# Patient Record
Sex: Male | Born: 1935
Health system: Southern US, Community
[De-identification: ages and names within clinical notes are randomized; demographics above are authoritative.]

## PROBLEM LIST (undated history)

## (undated) DIAGNOSIS — Z860101 Personal history of adenomatous and serrated colon polyps: Secondary | ICD-10-CM

## (undated) DIAGNOSIS — I350 Nonrheumatic aortic (valve) stenosis: Secondary | ICD-10-CM

## (undated) DIAGNOSIS — I2510 Atherosclerotic heart disease of native coronary artery without angina pectoris: Secondary | ICD-10-CM

## (undated) DIAGNOSIS — E785 Hyperlipidemia, unspecified: Secondary | ICD-10-CM

## (undated) DIAGNOSIS — I10 Essential (primary) hypertension: Secondary | ICD-10-CM

## (undated) DIAGNOSIS — Z973 Presence of spectacles and contact lenses: Secondary | ICD-10-CM

## (undated) DIAGNOSIS — R399 Unspecified symptoms and signs involving the genitourinary system: Secondary | ICD-10-CM

## (undated) DIAGNOSIS — N400 Enlarged prostate without lower urinary tract symptoms: Secondary | ICD-10-CM

## (undated) DIAGNOSIS — R152 Fecal urgency: Secondary | ICD-10-CM

## (undated) DIAGNOSIS — K5730 Diverticulosis of large intestine without perforation or abscess without bleeding: Secondary | ICD-10-CM

## (undated) DIAGNOSIS — E119 Type 2 diabetes mellitus without complications: Secondary | ICD-10-CM

## (undated) DIAGNOSIS — Z86010 Personal history of colonic polyps: Secondary | ICD-10-CM

## (undated) DIAGNOSIS — M1990 Unspecified osteoarthritis, unspecified site: Secondary | ICD-10-CM

## (undated) HISTORY — PX: CATARACT EXTRACTION W/ INTRAOCULAR LENS  IMPLANT, BILATERAL: SHX1307

## (undated) HISTORY — DX: Atherosclerotic heart disease of native coronary artery without angina pectoris: I25.10

## (undated) HISTORY — PX: COLONOSCOPY: SHX5424

## (undated) HISTORY — DX: Nonrheumatic aortic (valve) stenosis: I35.0

## (undated) HISTORY — DX: Essential (primary) hypertension: I10

## (undated) HISTORY — PX: CORONARY ANGIOPLASTY WITH STENT PLACEMENT: SHX49

---

## 1999-07-18 HISTORY — PX: INGUINAL HERNIA REPAIR: SUR1180

## 2002-03-09 ENCOUNTER — Emergency Department (HOSPITAL_COMMUNITY): Admission: EM | Admit: 2002-03-09 | Discharge: 2002-03-09 | Payer: Self-pay | Admitting: *Deleted

## 2002-03-09 ENCOUNTER — Encounter: Payer: Self-pay | Admitting: *Deleted

## 2006-05-22 ENCOUNTER — Ambulatory Visit (HOSPITAL_COMMUNITY): Admission: RE | Admit: 2006-05-22 | Discharge: 2006-05-22 | Payer: Self-pay | Admitting: Family Medicine

## 2007-12-04 ENCOUNTER — Ambulatory Visit (HOSPITAL_COMMUNITY): Admission: RE | Admit: 2007-12-04 | Discharge: 2007-12-04 | Payer: Self-pay | Admitting: Family Medicine

## 2007-12-06 ENCOUNTER — Ambulatory Visit: Payer: Self-pay | Admitting: Cardiovascular Disease

## 2007-12-06 ENCOUNTER — Ambulatory Visit (HOSPITAL_COMMUNITY): Admission: RE | Admit: 2007-12-06 | Discharge: 2007-12-06 | Payer: Self-pay | Admitting: Cardiovascular Disease

## 2007-12-06 ENCOUNTER — Encounter: Payer: Self-pay | Admitting: Family Medicine

## 2007-12-06 ENCOUNTER — Ambulatory Visit (HOSPITAL_COMMUNITY): Admission: RE | Admit: 2007-12-06 | Discharge: 2007-12-06 | Payer: Self-pay | Admitting: Family Medicine

## 2007-12-06 HISTORY — PX: TRANSTHORACIC ECHOCARDIOGRAM: SHX275

## 2007-12-19 ENCOUNTER — Inpatient Hospital Stay (HOSPITAL_BASED_OUTPATIENT_CLINIC_OR_DEPARTMENT_OTHER): Admission: RE | Admit: 2007-12-19 | Discharge: 2007-12-19 | Payer: Self-pay | Admitting: Cardiology

## 2007-12-19 ENCOUNTER — Ambulatory Visit: Payer: Self-pay | Admitting: Cardiology

## 2007-12-23 ENCOUNTER — Inpatient Hospital Stay (HOSPITAL_COMMUNITY): Admission: RE | Admit: 2007-12-23 | Discharge: 2007-12-24 | Payer: Self-pay | Admitting: Cardiology

## 2007-12-23 ENCOUNTER — Ambulatory Visit: Payer: Self-pay | Admitting: Cardiology

## 2008-01-21 ENCOUNTER — Encounter (HOSPITAL_COMMUNITY): Admission: RE | Admit: 2008-01-21 | Discharge: 2008-02-20 | Payer: Self-pay | Admitting: Cardiovascular Disease

## 2008-12-28 ENCOUNTER — Encounter: Payer: Self-pay | Admitting: Cardiovascular Disease

## 2009-08-20 ENCOUNTER — Ambulatory Visit (HOSPITAL_COMMUNITY): Admission: RE | Admit: 2009-08-20 | Discharge: 2009-08-20 | Payer: Self-pay | Admitting: Internal Medicine

## 2010-05-18 ENCOUNTER — Ambulatory Visit (HOSPITAL_COMMUNITY): Admission: RE | Admit: 2010-05-18 | Discharge: 2010-05-18 | Payer: Self-pay | Admitting: Internal Medicine

## 2010-11-29 NOTE — Procedures (Signed)
Jordan [name], Jordan [name]              ACCOUNT NO.:  [id number]   MEDICAL RECORD NO.:  [id number]          PATIENT TYPE:  OUT   LOCATION:  DFTL                          FACILITY:  [facility]   PHYSICIAN:  Scott A. Gerda Diss, MD    DATE OF BIRTH:  September 30, 1922   DATE OF PROCEDURE:  DATE OF DISCHARGE:                                  STRESS TEST   An 77 year old male with DOE and occasional left leg discomfort.   HISTORY OF PRESENT ILLNESS:  This gentleman presents with having  progressive notice of shortness of breath over the course of the past  few weeks.  He does have a history of diabetes, hyperlipidemia, and HTN.   PROTOCOL:  Standard stress test.   INDICATIONS:  Per above.   Resting EKG:  No acute ST-segment changes are noted, normal sinus  rhythm.   The patient was started on the standard Bruce protocol and immediately  heart rate went up from 71 into the low 100s.  The patient denied any  chest tightness, pressure, pain, or shortness breath.  He states that he  was not experiencing any discomfort, and it was very similar to the pace  he would take if he is walking in a stall.  When he reached stage 2, he  stated that was faster and more strenuous than what he would normally  take, and at about 2 minutes and 30 seconds, he states he was into stage  2.  He was feeling shortness of breath and relating some left leg  claudication symptoms.  Denied any chest pain.  The patient had  continued shortness of breath and at 2 minutes and 40 seconds into stage  2, we stopped the test.  It should be noted that he did have some ST  segment flattening and depression in lead III and aVF as well as lead  II, and he also had some ST segment changes in V5 and V6.  During the  recovery phase, he did have hypertensive episode, but no chest  discomfort with her shortness of breath.  He had progressive flattening  of the ST segment, II, III, aVF as well as V4, V5, and V6, but the heart  rate did gradually  slow down.  The patient did not experience any chest  pain or discomfort.  ST segments stayed depressed as far as being  flattened slightly and downturned in II, III, and aVF.  At 4 minutes  into recovery, and at 5 minutes, there was not the depression that I  expected; and at 6 minutes, he had recovery of those signs; and finally  at 9 minutes after recovery, when his EKG looked good and that baseline  blood pressure was good, he was discharged from the stress lab.   INTERPRETATION:  Abnormal stress test.   I did send him to the cardiology and have a more definitive testing  done.  We will set him up for echo as well, and probably, we will do an  arterial Doppler study __________.  We will proceed.      Scott A. Gerda Diss, MD  Electronically Signed     SAL/MEDQ  D:  12/04/2007  T:  12/04/2007  Job:  [id number]

## 2010-11-29 NOTE — Assessment & Plan Note (Signed)
[facility]                       [facility] CARDIOLOGY OFFICE NOTE   THEOPOLIS, SLOOP                     MRN:          [id number]  DATE:12/06/2007                            DOB:          08-Dec-1922    HISTORY OF PRESENT ILLNESS:  [name] is an 77 year old patient  referred by Dr. Gerda [name] for abnormal stress test and increasing  exertional dyspnea.  The patient has not had chest pain.  However, he  has had unexplained increasing dyspnea over the last 6 months.  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  He has  noticed over the last 6 months increasing dyspnea when he carries the  food or does a lot of walking.  He had an exercise stress test with Dr.  Gerda [name].  It was fairly abnormal.  The patient apparently walked about 6  minutes on a treadmill.  He had significant EKG changes in multiple  leads and exercise-induced hypotension and dyspnea.   I do not actually have the EKG tracings here, but did read a report.   The patient's risk factors include hypercholesterolemia and  hypertension.   He has not had previous cardiac workup before.  He had a 2-D  echocardiogram this morning in [place] before I saw him.  I read it.  He has mild LVH with normal LV function, trivial aortic insufficiency  and normal mitral valve, normal right-sided cardiac chambers, no  evidence of pulmonary hypertension.  It was a fairly benign looking echo  and does not explain his dyspnea.   REVIEW OF SYSTEMS:  Remarkable for no significant cough or sputum  production.  He has not had a history of chronic lung disease.  He is a  nonsmoker.  There has been no asthma.  No lower extremity edema or signs  of a pulmonary hypertension.   PAST MEDICAL HISTORY:  Fairly benign.  He has not been hospitalized.  He  has had hypertension, hypercholesterolemia, some prostatism and some  difficulty at times sleeping   ALLERGIES:  HE HAS NO KNOWN  ALLERGIES.   SOCIAL HISTORY:  He is widowed.  He has two children that live in the  area.  He is active with the church, in particularly the food pantry.  He does not drink or smoke.   FAMILY HISTORY:  Unremarkable in regards to premature coronary disease.  He does have brothers who have unspecified heart issues.   MEDICATIONS:  1. Enalapril 5 a day.  2. Pravastatin 40 a day.  3. Flomax 0.4 a day.  4. Amitriptyline 25 a day.   PHYSICAL EXAMINATION:  GENERAL:  Remarkable for an elderly white male in  no distress.  VITAL SIGNS:  Weight is 166, blood pressure 126/60, pulse 60 and  regular, respiratory 14, afebrile.  Affect appropriate.  HEENT:  Unremarkable.  Carotids normal without bruit.  No  lymphadenopathy, thyromegaly, JVP elevation.  LUNGS:  Clear with good diaphragmatic motion.  No wheezing, S1-S2,  normal heart sounds.  PMI normal.  ABDOMEN:  Benign, bowel sounds positive.  No AAA.  No tenderness.  No  bruit.  No hepatosplenomegaly.  No hepatojugular reflux.  Distal pulses  intact.  No edema.  NEUROLOGICAL:  Nonfocal.  SKIN:  Warm and dry.  No muscular weakness.   STUDIES:  EKG shows sinus rhythm with incomplete right bundle branch  block and left anterior fascicular block.   IMPRESSION:  1. Abnormal stress test with exertional dyspnea, question anginal      equivalent, talked to the patient at length.  I think the best test      for him would be a right and left heart catheterization.  I am      particularly impressed by Dr. Fletcher [name] wording of the abnormalities      in the stress test.  He has no other reason to have worsening      exertional dyspnea in the last 6 months.  Catheterization will      include right heart pressures.  2. Hypertension, currently well-controlled.  Continue enalapril 5 mg a      day, low-salt diet.  3. Hypercholesterolemia.  Continue pravastatin.  Lipid and liver      profile in 6 months.  4. Prostatism.  Continue Flomax 0.4 mg a day.  PSA  in 6 months.  5. Routine lab work will be done as well.  I think at the time we will      be able to screen for anemia as the cause of his exertional      dyspnea, and also make sure the remainder of his labs are in a good      range.  He will also have a chest x-ray prior to his cath which      needs to be looked at.  As far as I can tell, he has not had a      recent one and he has significant dyspnea.   I explained to the patient that I do not have any lab days in the  upcoming weeks, and I will have Dr. Antoine Poche do his right and left heart  cath on June 4.     Jordan [name]. Jordan Emms, MD, [facility]  Electronically Signed    PCN/MedQ  DD: 12/06/2007  DT: 12/06/2007  Job #: [id number]

## 2010-11-29 NOTE — Cardiovascular Report (Signed)
Jordan [name], Jordan [name]              ACCOUNT NO.:  [id number]   MEDICAL RECORD NO.:  [id number]          PATIENT TYPE:  OIB   LOCATION:  1965                         FACILITY:  [facility]   PHYSICIAN:  Rollene Rotunda, MD, FACCDATE OF BIRTH:  April 23, 1923   DATE OF PROCEDURE:  12/19/2007  DATE OF DISCHARGE:  12/19/2007                            CARDIAC CATHETERIZATION   PRIMARY CARE PHYSICIAN:  Scott A. Gerda Diss, MD   CARDIOLOGIST:  Noralyn Pick. Eden Emms, MD, [facility]   PROCEDURE:  Left and right heart catheterization/coronary arteriography.   INDICATIONS:  The patient with dyspnea on exertion and an abnormal  exercise treadmill test with significant EKG changes in multiple  leads.   PROCEDURE NOTE:  Left heart catheterization performed via the right  femoral artery, right heart catheterization performed via the right  femoral vein.  Both vessels were cannulated using the anterior wall  puncture.  A #4-French arterial sheath and a #7-French venous sheath  were inserted via the modified Seldinger technique with preformed  Judkins and a pigtail catheter were utilized as well as a Swan-Ganz  catheter.  The patient tolerated the procedure well and left the lab in  stable condition.   RESULTS:  Hemodynamics RA mean 7, RV 32/40, PA 28/11 with a mean of 18,  pulmonary capillary wedge pressure mean 9.  LV 127/17, AL 127/80.  Coronaries, the left main was calcified with diffuse 25% stenosis.  The  LAD had proximal and moderate calcification.  There was a long ostial  and proximal 30% stenosis and mid 30% stenosis.  There was a large mid  diagonal, which was normal.  The circumflex had proximal heavy  calcification.  There was proximal diffuse 25% stenosis.  There was mid  long 40-50% stenosis before an obtuse marginal.  The mid obtuse marginal  was large and normal.  The right coronary artery was dominant vessel.  There was proximal and mid heavy calcification.  There was a proximal  focal 95% stenosis.   There was diffuse 25% proximal mid stenosis.  There  was 30% stenosis after the PDA.  The PDA was large and normal.  Posterolateral was moderate sized and normal.  Left ventriculogram was  obtained in the RAO projection.  The EF was 65% with normal wall motion.   CONCLUSION:  1. Normal left ventricular function.  2. Normal right heart pressures.  3. Severe single-vessel coronary artery disease.   PLAN:  The patient will be brought back for percutaneous  revascularization of the right coronary artery.      Rollene Rotunda, MD, [facility]  Electronically Signed     JH/MEDQ  D:  12/19/2007  T:  12/20/2007  Job:  [id number]   cc:   Noralyn Pick. Eden Emms, MD, [facility]  Scott A. Gerda Diss, MD

## 2010-11-29 NOTE — Discharge Summary (Signed)
NAMEONEAL, SCHOENBERGER              ACCOUNT NO.:  [id number]   MEDICAL RECORD NO.:  [id number]          PATIENT TYPE:  OIB   LOCATION:  6531                         FACILITY:  [facility]   PHYSICIAN:  Veverly Fells. Excell Seltzer, MD  DATE OF BIRTH:  1923-04-23   DATE OF ADMISSION:  12/23/2007  DATE OF DISCHARGE:  12/24/2007                               DISCHARGE SUMMARY   PRIMARY CARDIOLOGIST:  Theron Arista C. Eden Emms, MD, [facility]   PRIMARY CARE Mikko Lewellen:  Dr. Gerda Diss.   DISCHARGE DIAGNOSIS:  Coronary artery disease.   SECONDARY DIAGNOSES:  1. Hypertension.  2. Hyperlipidemia.  3. Benign prostatic hypertrophy.  4. Insomnia.  5. Type 2 diabetes mellitus, currently diet controlled.   ALLERGIES:  No known drug allergies.   PROCEDURES:  Successful PCI and stenting of the proximal right coronary  artery with placement of a 3.0 x 12 mm Endeavor drug-eluting stent.   HISTORY OF PRESENT ILLNESS:  An 77 year old Caucasian male who recently  saw Dr. Eden Emms on Dec 06, 2007, secondary to progressive dyspnea over a  98-month period.  He underwent stress testing which was abnormal and was  subsequently set up for catheterization which took place on December 19, 2007, revealing normal right heart pressures with a 95% stenosis in the  proximal right coronary artery.  He was then set up to come back for  PCI.   HOSPITAL COURSE:  The patient presented to the [facility] on  December 23, 2007, and subsequently underwent successful rotational  atherectomy with placement of a 3.0 x 12 mm Endeavor drug-eluting stent.  The patient tolerated this procedure well and postprocedure ECG showed  no acute changes.  He has been ambulating in the hallway without  recurrent symptoms or limitations and will be discharged home today in  good condition.   DISCHARGE LABS:  Hemoglobin 12.6, hematocrit 37.3, WBC 7.9, platelets  268.  Sodium 140, potassium 4.4, chloride 107, CO2 28, BUN 20,  creatinine 0.92, and glucose 135.   DISPOSITION:  The patient is being discharged home today in good  condition.   FOLLOW-UP APPOINTMENTS:  We have arranged for follow-up with Dr. Eden Emms  on January 10, 2008, at 10:30 a.m..  He was asked to follow up with Dr.  Gerda Diss in 1-2 weeks.   DISCHARGE MEDICATIONS:  1. Aspirin 325 daily.  2. Plavix 75 mg daily.  3. Enalapril 5 mg nightly.  4. Flomax 0.4 mg daily.  5. Amitriptyline 25 mg nightly.  6. Pravastatin 40 mg nightly.  7. Nitroglycerin 0.4 mg sublingual p.r.n. chest pain.   OUTSTANDING LAB STUDIES:  None.   DURATION DISCHARGE ENCOUNTER:  40 minutes including physician time.      Nicolasa Ducking, ANP      Veverly Fells. Excell Seltzer, MD  Electronically Signed    CB/MEDQ  D:  12/24/2007  T:  12/24/2007  Job:  [id number]   cc:   Dr. Gerda Diss

## 2010-12-02 NOTE — Group Therapy Note (Signed)
   [name], Jordan [name]                         ACCOUNT NO.:  [id number]   MEDICAL RECORD NO.:  [id number]                   PATIENT TYPE:  EMS   LOCATION:  ED                                   FACILITY:  [facility]   PHYSICIAN:  Fredirick Maudlin, M.D.              DATE OF BIRTH:  [birth year]   DATE OF PROCEDURE:  DATE OF DISCHARGE:  03/09/2002                                   PROGRESS NOTE   ELECTROCARDIOGRAM:  The rhythm is sinus rhythm with a bradycardiac rate in  the 50's. There are mild inferior Q wave changes.   INTERPRETATION:  Otherwise normal electrocardiogram.                                               Fredirick Maudlin, M.D.    ELH/MEDQ  D:  03/09/2002  T:  03/09/2002  Job:  [id number]

## 2011-04-13 LAB — BASIC METABOLIC PANEL
CO2: 28
Calcium: 8.6
Calcium: 8.7
GFR calc Af Amer: >60
GFR calc non Af Amer: >60
GFR calc non Af Amer: >60
Glucose, Bld: 136 — ABNORMAL HIGH
Potassium: 4.3
Sodium: 140
Sodium: 141

## 2011-04-13 LAB — POCT I-STAT 3, VENOUS BLOOD GAS (G3P V)
Bicarbonate: 26.4 — ABNORMAL HIGH
O2 Saturation: 71
Operator id: 194801
TCO2: 28
pO2, Ven: 39

## 2011-04-13 LAB — PROTIME-INR: INR: 1

## 2011-04-13 LAB — POCT I-STAT 3, ART BLOOD GAS (G3+)
Acid-Base Excess: 2
O2 Saturation: 94
Operator id: 194801
pO2, Arterial: 72 — ABNORMAL LOW

## 2011-04-13 LAB — CBC
HCT: 37.5 — ABNORMAL LOW
Hemoglobin: 12.6 — ABNORMAL LOW
Hemoglobin: 13.1
Platelets: 259
RBC: 4.24
RDW: 12.5
WBC: 5.3
WBC: 7.9

## 2011-11-25 ENCOUNTER — Other Ambulatory Visit: Payer: Self-pay | Admitting: Cardiology

## 2011-11-27 NOTE — Telephone Encounter (Signed)
PER PT PMD  STOPPED PLAVIX FEW YEARS AGO./CY

## 2011-11-27 NOTE — Telephone Encounter (Signed)
Christy Could you please look at this refill, this patient is not a Dr Antoine Poche patient . It looks like Dr Eden Emms was seeing the patient in 2009. Thanks Okey Dupre

## 2012-05-27 ENCOUNTER — Telehealth: Payer: Self-pay

## 2012-05-27 NOTE — Telephone Encounter (Signed)
Pt was referred by Dr. Ouida Sills for screening colonoscopy. He said his last one was approximately 5 years ago and he was told that would probably be his last. He is having some hemorrhoids and would like to be seen for that. OV with AS on 05/30/2012 at 2:30 PM.

## 2012-05-27 NOTE — Telephone Encounter (Signed)
LM for pt to returncall

## 2012-05-27 NOTE — Telephone Encounter (Signed)
Pt called this morning and wanted to talk with Tyler Aas. I told him she was on the phone and she would call him back. His number is [phone]

## 2012-05-29 ENCOUNTER — Ambulatory Visit (HOSPITAL_COMMUNITY)
Admission: RE | Admit: 2012-05-29 | Discharge: 2012-05-29 | Disposition: A | Discharge status: Home / Self Care | Payer: Medicare Other | Source: Ambulatory Visit | Attending: Internal Medicine | Admitting: Internal Medicine

## 2012-05-29 DIAGNOSIS — R2689 Other abnormalities of gait and mobility: Secondary | ICD-10-CM | POA: Insufficient documentation

## 2012-05-29 DIAGNOSIS — IMO0001 Reserved for inherently not codable concepts without codable children: Secondary | ICD-10-CM | POA: Insufficient documentation

## 2012-05-29 DIAGNOSIS — R279 Unspecified lack of coordination: Secondary | ICD-10-CM | POA: Insufficient documentation

## 2012-05-29 NOTE — Evaluation (Cosign Needed)
Physical Therapy Evaluation  Patient Details  Name: Jordan [name] MRN: [id number] Date of Birth: Jul 07, 1923  Today's Date: 05/29/2012 Time: 4098-1191 PT Time Calculation (min): 39 min  Visit#: 1  of 8   Re-eval: 06/28/12 Assessment Diagnosis: balance disorder Prior Therapy: none  Authorization: medicare  Authorization Visit#: 2  of 8    Past Medical History: No past medical history on file. Past Surgical History: No past surgical history on file.  Subjective Symptoms/Limitations Symptoms: Jordan [name] states that he does not feel steady on his feet.  He feels as if when he is walking he is walking like a drunk.  He has not fallen.  He states his symptoms has been going on for several months.  The patient goes to the [facility] five times a weak. How long can you sit comfortably?: no difficulty How long can you stand comfortably?: no problem How long can you walk comfortably?: He can walk for about ten minutes  Pain Assessment Currently in Pain?: No/denies    Exercise/Treatments Mobility/Balance  Berg Balance Test Sit to Stand: Able to stand without using hands and stabilize independently Standing Unsupported: Able to stand safely 2 minutes Sitting with Back Unsupported but Feet Supported on Floor or Stool: Able to sit safely and securely 2 minutes Stand to Sit: Controls descent by using hands Transfers: Able to transfer safely, definite need of hands Standing Unsupported with Eyes Closed: Able to stand 10 seconds with supervision Standing Ubsupported with Feet Together: Able to place feet together independently and stand for 1 minute with supervision From Standing, Reach Forward with Outstretched Arm: Can reach confidently >25 cm (10") From Standing Position, Pick up Object from Floor: Able to pick up shoe safely and easily From Standing Position, Turn to Look Behind Over each Shoulder: Looks behind one side only/other side shows less weight shift Turn 360 Degrees: Able to  turn 360 degrees safely in 4 seconds or less Standing Unsupported, Alternately Place Feet on Step/Stool: Able to stand independently and safely and complete 8 steps in 20 seconds Standing Unsupported, One Foot in Front: Able to plae foot ahead of the other independently and hold 30 seconds Standing on One Leg: Able to lift leg independently and hold equal to or more than 3 seconds Total Score: 48  Timed Up and Go Test TUG: Normal TUG Normal TUG (seconds): 15    Strength:  WNL  Balance Exercises Standing SLS: Eyes open;5 reps Heel Raises: 10 reps Other Standing Exercises: Postural correction: pt keeps shoulders behind hips correct and have pt do activity keeping corrected     Physical Therapy Assessment and Plan PT Assessment and Plan Clinical Impression Statement: Pt exhibits some balance issues which would benefit from physical therapy for high balance exercises to decrease pt risk of falling. Pt will benefit from skilled therapeutic intervention in order to improve on the following deficits: Decreased balance (gait disturbance) Rehab Potential: Good PT Frequency: Min 2X/week PT Duration: 4 weeks PT Treatment/Interventions: Balance training;Neuromuscular re-education;Therapeutic activities;Therapeutic exercise PT Plan: begin tandem gt; retro gt; side stepw/t-band, rockerboard, cone foam rotation, eyes closed on foarm;  third visit add balance beam stepping over hurdles.    Goals Home Exercise Program Pt will Perform Home Exercise Program: Independently PT Short Term Goals Time to Complete Short Term Goals: 2 weeks PT Short Term Goal 1: Pt SLS to be over 10 sec B to allow pt to feel more stable. PT Long Term Goals Time to Complete Long Term Goals: 4 weeks PT Long  Term Goal 1: Pt ABC score to go up 10% PT Long Term Goal 2: Pt to be I in advance HEP Long Term Goal 3: Pt posture to have shoulders over hips not behind so pt feels more in balance Long Term Goal 4: improve berg  balance by 6 levels.  Problem List Patient Active Problem List  Diagnosis  . Balance disorder    PT - End of Session Activity Tolerance: Patient tolerated treatment well General Behavior During Session: [name] for tasks performed Cognition: [name] for tasks performed PT Plan of Care PT Home Exercise Plan: given  GP Functional Assessment Tool Used: ABC and clinical judgement  Functional Limitation: Mobility: Walking and moving around Mobility: Walking and Moving Around Current Status (D6644): At least 20 percent but less than 40 percent impaired, limited or restricted Mobility: Walking and Moving Around Goal Status [phone]): At least 1 percent but less than 20 percent impaired, limited or restricted  [name],CINDY 05/29/2012, 4:28 PM  Physician Documentation Your signature is required to indicate approval of the treatment plan as stated above.  Please sign and either send electronically or make a copy of this report for your files and return this physician signed original.   Please mark one 1.__approve of plan  2. ___approve of plan with the following conditions.   ______________________________                                                          _____________________ Physician Signature                                                                                                             Date

## 2012-05-30 ENCOUNTER — Ambulatory Visit (INDEPENDENT_AMBULATORY_CARE_PROVIDER_SITE_OTHER): Payer: Medicare Other | Admitting: Gastroenterology

## 2012-05-30 ENCOUNTER — Encounter: Payer: Self-pay | Admitting: Gastroenterology

## 2012-05-30 VITALS — BP 133/59 | HR 62 | Temp 98.0°F | Ht 69.0 in | Wt 158.0 lb

## 2012-05-30 DIAGNOSIS — K5900 Constipation, unspecified: Secondary | ICD-10-CM

## 2012-05-30 MED ORDER — HYDROCORTISONE 2.5 % RE CREA: TOPICAL_CREAM | Freq: Two times a day (BID) | RECTAL | Status: DC

## 2012-05-30 MED ORDER — LIDOCAINE-HYDROCORTISONE ACE 3-1 % RE KIT: 1.0000 "application " | PACK | Freq: Two times a day (BID) | RECTAL | Status: DC

## 2012-05-30 MED ORDER — POLYETHYLENE GLYCOL 3350 17 GM/SCOOP PO POWD: 17.0000 g | Freq: Every day | ORAL | Status: DC

## 2012-05-30 NOTE — Patient Instructions (Addendum)
I have sent a cream to your pharmacy to use twice a day for 7 days.   Keep taking fiber, a stool softener once to twice a day, and start taking Miralax each night. I have sent this prescription to your pharmacy as well.  We will see you back in 4-6 weeks. I will be getting the reports from your colonoscopy. You may need an updated colonoscopy if things do not improve; however, we can discuss this at your next visit.   Please review the high fiber diet, and call us if any problems!   How to Increase Fiber in the Meal Plan for Diabetes Increasing fiber in the diet has many benefits including lowering blood cholesterol, helping to control blood glucose (sugar), preventing constipation, and aiding in weight management by helping you feel full longer. Start adding fiber to your diet slowly. A gradual substitution of high fiber foods for low fiber foods will allow the digestive tract to adjust. Most men under 57 years of age should aim to eat 38 g of fiber a day. Women should aim for 25 g. Over 64 years of age, most men need 30 g of fiber and most women need 21 g. Below are some suggestions for increasing fiber.  Try whole-wheat bread instead of white bread. Look for words high on the list of ingredients such as whole wheat, whole rye, or whole oats.   Try baked potato with skin instead of mashed potatoes.   Try a fresh apple with skin instead of applesauce.   Try a fresh orange instead of orange juice.   Try popcorn instead of potato chips.   Try bran cereal instead of corn flakes.   Try kidney, whole pinto, or garbanzo beans instead of bread.   Try whole-grain crackers instead of saltine crackers.   Try whole-wheat pasta instead of regular varieties.   While on a high fiber diet, drink enough water and fluids to keep your urine clear or pale yellow.   Eat a variety of high fiber foods such as fruits, vegetables, whole grains, nuts, and seeds.   Try to increase your intake of fiber by  eating high fiber foods instead of taking fiber pills or supplements that contain small amounts of fiber. There can be additional benefits for long-term health and blood glucose control with high fiber foods. Aim for 5 servings of fruit and vegetables per day.  SOURCES OF FIBER The following list shows the average dietary fiber for types of food in the various food groups. Starch and Bread / Dietary Fiber (g)  Whole-grain breads, 1 slice / 2 g   Whole grain,  cup / 2 g   Whole-grain cereals,  cup / 3 g   Bran cereals,  to  cup / 8 g   Starchy vegetables,  cup / 3 g   Legumes (beans, peas, lentils),  cup / 8 g   Oatmeal,  cup / 2 g   Whole-wheat pasta,  cup / 2 g   Brown rice,  cup / 2 g   Barley,  cup / 3 g  Meat and Meat Substitutes / Dietary Fiber (g) This group averages 0 grams of fiber. Exceptions are:  Nuts, seeds, 1 oz or  cup / 3 g   Chunky peanut butter, 2 tbs / 3 g  Vegetables / Dietary Fiber (g)  Cooked vegetables,  to  cup / 2 to 3 g   Raw vegetables, 1 to 2 cups / 2 to 3 g  Fruit / Dietary Fiber (g)  Raw or cooked fruit,  cup or 1 small, fresh piece / 2 g  Milk / Dietary Fiber (g)  Milk, 1 cup or 8 oz / 0 g  Fats and Oils / Dietary Fiber (g)  Fats and oils, 1 tsp / 0 g  You can determine how much fiber you are eating by reading the Nutrition Facts panel on the labels of the foods you eat. FIBER IN SPECIFIC FOODS Cereals / Dietary Fiber (g)  All Bran,  cup / 9 g   All Bran with Extra Fiber,  cup / 13 g   Bran Flakes,  cup / 4 g   Cheerios,  cup / 1.5 g   Corn Bran,  cup / 4 g   Corn Flakes,  cup / 0.75 g   Cracklin' Oat Bran,  cup / 4 g   Fiber One,  cup / 13 g   Grape Nuts, 3 tbs / 3 g   Grape Nuts Flakes,  cup / 3 g   Noodles,  cup, cooked / 0.5 g   Nutrigrain Wheat,  cup / 3.5 g   Oatmeal,  cup, cooked / 1.1 g   Pasta, white (macaroni, spaghetti),  cup, cooked / 0.5 g   Pasta, whole-wheat  (macaroni, spaghetti),  cup, cooked / 2 g   [place],  cup, cooked / 3 g   Rice, wild,  cup, cooked / 0.5 g   Rice, brown,  cup, cooked / 1 g   Rice, white,  cup, cooked / 0.2 g   Shredded Wheat, bite-sized,  cup / 2 g   Total,  cup / 1.75 g   Wheat Chex,  cup / 2.5 g   Wheatena,  cup, cooked / 4 g   Wheaties,  cup / 2.75 g  Bread, Starchy Vegetables, and Dried Peas and Beans / Dietary Fiber (g)  Bagel, whole / 0.6 g   Baked beans in tomato sauce,  cup, cooked / 3 g   Bran muffin, 1 small / 2.5 g   Bread, cracked wheat, 1 slice / 2.5 g   Bread, pumpernickel, 1 slice / 2.5 g   Bread, white, 1 slice / 0.4 g   Bread, whole-wheat, 1 slice / 1.4 g   Corn,  cup, canned / 2.9 g   Kidney beans,  cup, cooked / 3.5 g   Lentils, cup, cooked / 3 g   Lima beans,  cup, cooked / 4 g   Navy beans,  cup, cooked / 4 g   Peas,  cup, cooked / 4 g   Popcorn, 3 cups popped, unbuttered / 3.5 g   Potato, baked (with skin), 1 small / 4 g   Potato, baked (without skin), 1 small / 2 g   Ry-Krisp, 4 crackers / 3 g   Saltine crackers, 6 squares / 0 g   Split peas,  cup, cooked / 2.5 g   Yams (sweet potato),  cup / 1.7 g  Fruit / Dietary Fiber (g)  Apple, 1 small, fresh, with skin / 4 g   Apple juice,  cup / 0.4 g   Apricots, 4 medium, fresh / 4 g   Apricots, 7 halves, dried / 2 g   Banana,  medium / 1.2 g   Blueberries,  cup / 2 g   Cantaloupe, melon / 1.3 g   Cherries,  cup, canned / 1.4 g   Grapefruit,  medium / 1.6 g  Grapes, 15 small / 1.2 g   Grape juice,  cup / 0.5 g   Orange, 1 medium, fresh / 2 g   Orange juice,  cup / 0.5 g   Peach, 1 medium,fresh, with skin / 2 g   Pear, 1 medium, fresh, with skin / 4 g   Pineapple, cup, canned / 0.7 g   Plums, 2 whole / 2 g   Prunes, 3 whole / 1.5 g   Raspberries, 1 cup / 6 g   Strawberries, 1  cup / 4 g   Watermelon, 1  cup / 0.5 g  Vegetables / Dietary Fiber  (g)  Asparagus,  cup, cooked / 1 g   Beans, green and wax,  cup, cooked / 1.6 g   Beets,  cup, cooked / 1.8 g   Broccoli,  cup, cooked / 2.2 g   Brussels sprouts,  cup, cooked / 4 g   Cabbage,  cup, cooked / 2.5 g   Carrots,  cup, cooked / 2.3 g   Cauliflower,  cup, cooked / 1.1 g   Celery, 1 cup, raw / 1.5 g   Cucumber, 1 cup, raw / 0.8 g   Green pepper,  cup sliced, cooked / 1.5 g   Lettuce, 1 cup, sliced / 0.9 g   Mushrooms, 1 cup sliced, raw / 1.8 g   Onion, 1 cup sliced, raw / 1.6 g   Spinach,  cup, cooked / 2.4 g   Tomato, 1 medium, fresh / 1.5 g   Tomato juice,  cup / 0 g   Zucchini,  cup, cooked / 1.8 g  Document Released: 12/23/2001 Document Revised: 09/25/2011 Document Reviewed: 01/19/2009 [facility] Patient Information 2013 [place], [place].

## 2012-05-30 NOTE — Progress Notes (Signed)
Primary Care Physician:  Carylon Perches, MD Primary Gastroenterologist:  Dr. Jena Gauss   Chief Complaint  Patient presents with  . Colonoscopy    HPI:   78 year old male presenting today at request of Dr. Ouida Sills for possible screening colonoscopy. Last TCS by Dr. Jarold Motto in 2004: pancolonic diverticula. Records note hx of adenomatous polyps. Notes new onset constipation. Started around 8 months ago, no prior issues. Has added Citrucel and fiber-one bar in morning. Added stool softeners. Has helped some. Notes urgency to have BM, then subsequent straining. Feels "restricted" at rectum. Notes burning pain. 3 weeks ago used hemorrhoid ointment, which helped relieve discomfort. Now burning has returned but less intense. No blood in stool. No abdominal pain. BM usually every day but straining a lot.   No loss of appetite. +wt loss, but states he has been watching his diet and goes to fitness center about 4 times a week. Normal wt used to be 172, now in the 150s. No heartburn, no dysphagia.   Past Medical History  Diagnosis Date  . Hypertension   . Hypercholesterolemia   . Diabetes mellitus   . CAD (coronary artery disease)     with stent  . BPH (benign prostatic hyperplasia)     Past Surgical History  Procedure Date  . Hernia repair     right groin  . Coronary angioplasty with stent placement     RCA  . Colonoscopy June 2004    Dr. Jarold Motto: notes hx of adenomatous polyps. no polyps at time of exam, noted pancolonic diverticula.     Current Outpatient Prescriptions  Medication Sig Dispense Refill  . aspirin 81 MG tablet Take 81 mg by mouth daily.      Marland Kitchen donepezil (ARICEPT) 10 MG tablet Take 10 mg by mouth at bedtime as needed.      . enalapril (VASOTEC) 10 MG tablet Take 10 mg by mouth daily.      . hydrochlorothiazide (MICROZIDE) 12.5 MG capsule Take 12.5 mg by mouth daily.      . metFORMIN (GLUCOPHAGE) 500 MG tablet Take 500 mg by mouth 2 (two) times daily with a meal.      . Multiple  Vitamins-Minerals (OCUVITE PRESERVISION PO) Take by mouth daily.      . pravastatin (PRAVACHOL) 80 MG tablet Take 80 mg by mouth daily.      . Tamsulosin HCl (FLOMAX) 0.4 MG CAPS Take 0.4 mg by mouth daily.      . hydrocortisone (ANUSOL-HC) 2.5 % rectal cream Place rectally 2 (two) times daily. For 10 days.  30 g  1  . lidocaine-hydrocortisone (ANAMANTLE) 3-1 % KIT Place 1 application rectally 2 (two) times daily. For 7 days  1 each  0  . polyethylene glycol powder (GLYCOLAX/MIRALAX) powder Take 17 g by mouth daily. For a bowel movement  255 g  3    Allergies as of 05/30/2012  . (No Known Allergies)    Family History  Problem Relation Age of Onset  . Colon cancer Neg Hx     History   Social History  . Marital Status: Widowed    Spouse Name: [name]    Number of Children: [name]  . Years of Education: [name]   Occupational History  . Not on file.   Social History Main Topics  . Smoking status: Never Smoker   . Smokeless tobacco: Not on file  . Alcohol Use: No  . Drug Use: No  . Sexually Active: Not on file   Other Topics Concern  .  Not on file   Social History Narrative  . No narrative on file    Review of Systems: Gen: See HPI CV: Denies chest pain, heart palpitations, peripheral edema, syncope.  Resp: Denies shortness of breath at rest or with exertion. Denies wheezing or cough.  GI: SEE HPI GU : +urinary hesitancy MS: +joint pain, "age" Derm: Denies rash, itching, dry skin Psych: +short-term memory impaired occasionally Heme: Denies bruising, bleeding, and enlarged lymph nodes.  Physical Exam: BP 133/59  Pulse 62  Temp 98 F (36.7 C) (Temporal)  Ht 5\' 9"  (1.753 m)  Wt 158 lb (71.668 kg)  BMI 23.33 kg/m2 General:   Alert and oriented. Pleasant and cooperative. Well-nourished and well-developed.  Head:  Normocephalic and atraumatic. Eyes:  Without icterus, sclera clear and conjunctiva pink.  Ears:  Mild HOH Nose:  No deformity, discharge,  or lesions. Mouth:   No deformity or lesions, oral mucosa pink.  Neck:  Supple, without mass or thyromegaly. Lungs:  Clear to auscultation bilaterally. No wheezes, rales, or rhonchi. No distress.  Heart:  S1, S2 present without murmurs appreciated.  Abdomen:  +BS, soft, non-tender and non-distended. No HSM noted. No guarding or rebound. No masses appreciated.  Rectal:  External exam without evidence of significant hemorrhoids; however, note superficial irritation around rectum. Internal exam with slight discomfort, enlarged prostate. Msk:  Symmetrical without gross deformities. Normal posture. Extremities:  Without clubbing or edema. Neurologic:  Alert and  oriented x4;  grossly normal neurologically. Skin:  Intact without significant lesions or rashes. Cervical Nodes:  No significant cervical adenopathy. Psych:  Alert and cooperative. Normal mood and affect.

## 2012-06-03 ENCOUNTER — Telehealth: Payer: Self-pay | Admitting: Gastroenterology

## 2012-06-03 ENCOUNTER — Encounter: Payer: Self-pay | Admitting: Gastroenterology

## 2012-06-03 DIAGNOSIS — K5900 Constipation, unspecified: Secondary | ICD-10-CM | POA: Insufficient documentation

## 2012-06-03 NOTE — Progress Notes (Signed)
Faxed to PCP

## 2012-06-03 NOTE — Assessment & Plan Note (Signed)
78 year old male with hx of adenomatous polyps, last TCS in 2004 by Dr. Jarold Motto with pancolonic diverticula. However, notes change in bowel habits X 8 months, no prior hx of constipation. Now with frequent straining, urgency to have BM but unproductive. No outright rectal bleeding. Does note anal irritation, discomfort as well. +wt loss of approximately 20 lbs, which he attributes to dietary changes and exercise. Due to change in bowel habits and hx of adenomatous polyps, would benefit from re-evaluation of lower GI tract. Add anusol cream BID due to anal irritation.Continue high fiber diet, stool softener, and add Miralax daily prn.   Proceed with TCS with Dr. Jena Gauss in near future: the risks, benefits, and alternatives have been discussed with the patient in detail. The patient states understanding and desires to proceed.

## 2012-06-03 NOTE — Telephone Encounter (Signed)
Please let pt know that I have received his colonoscopy reports.  Looks like he has a hx of pre-cancerous polyps, with last TCS in 2004.  With his change in bowel habits, I think it would be wise to offer a colonoscopy.  Please find out if he is agreeable to this; it is what I recommend now that I have these reports available.  Thanks!

## 2012-06-04 ENCOUNTER — Ambulatory Visit (HOSPITAL_COMMUNITY): Payer: Medicare Other | Admitting: Physical Therapy

## 2012-06-05 NOTE — Telephone Encounter (Signed)
Tried to call pt- NA 

## 2012-06-06 ENCOUNTER — Ambulatory Visit (HOSPITAL_COMMUNITY)
Admission: RE | Admit: 2012-06-06 | Discharge: 2012-06-06 | Disposition: A | Discharge status: Home / Self Care | Payer: Medicare Other | Source: Ambulatory Visit | Attending: Internal Medicine | Admitting: Internal Medicine

## 2012-06-06 DIAGNOSIS — R2689 Other abnormalities of gait and mobility: Secondary | ICD-10-CM

## 2012-06-06 NOTE — Progress Notes (Signed)
Physical Therapy Treatment Patient Details  Name: Jordan [name] MRN: [id number] Date of Birth: Oct 18, 1922  Today's Date: 06/06/2012 Time: 3244-0102 PT Time Calculation (min): 40 min  Visit#: 2  of 8   Re-eval: 06/28/12  Charge: NMR 38'  Authorization: medicare  Authorization Time Period:    Authorization Visit#: 2  of 8    Subjective: Symptoms/Limitations Symptoms: Pt stated compliance with HEP, no pain this afternoon.  Objective:   Exercise/Treatments Balance Exercises Standing Standing Eyes Closed: Foam;3 reps;30 secs;Narrow base of support (BOS) SLS: Eyes open;3 reps;Limitations SLS Limitations: R 30", L 53" max of 3 Tandem Gait: Forward;Limitations Tandem Gait Limitations: 2RT Retro Gait: Limitations Retro Gait Limitations: 2 RT Cone Rotation: Foam;A/P;R/L Heel Raises: 10 reps Toe Raise: 10 reps Other Standing Exercises: Postural correction   Physical Therapy Assessment and Plan PT Assessment and Plan Clinical Impression Statement: Began high level balance training activities to decrease pt risk of falling.  Pt required min assistance and cueing for spatial awareness to improve balance.  Pt limited by fatigue. PT Plan: Next session begin side stepping with t-band, progress tandem and retro to balance beam and begin hurdles.    Goals    Problem List Patient Active Problem List  Diagnosis  . Balance disorder  . Constipation    PT - End of Session Activity Tolerance: Patient tolerated treatment well General Behavior During Session: [facility] for tasks performed Cognition: [facility] for tasks performed  GP    Juel Burrow 06/06/2012, 5:17 PM

## 2012-06-06 NOTE — Telephone Encounter (Signed)
Noted  

## 2012-06-06 NOTE — Telephone Encounter (Signed)
Pt aware, he has an appointment to come back in next month and he wants to wait until his appt to decide and schedule.

## 2012-06-11 ENCOUNTER — Ambulatory Visit (HOSPITAL_COMMUNITY): Payer: Medicare Other | Admitting: *Deleted

## 2012-06-18 ENCOUNTER — Ambulatory Visit (HOSPITAL_COMMUNITY)
Admission: RE | Admit: 2012-06-18 | Discharge: 2012-06-18 | Disposition: A | Discharge status: Home / Self Care | Payer: Medicare Other | Source: Ambulatory Visit | Attending: Internal Medicine | Admitting: Internal Medicine

## 2012-06-18 DIAGNOSIS — IMO0001 Reserved for inherently not codable concepts without codable children: Secondary | ICD-10-CM | POA: Insufficient documentation

## 2012-06-18 DIAGNOSIS — R2689 Other abnormalities of gait and mobility: Secondary | ICD-10-CM

## 2012-06-18 DIAGNOSIS — R279 Unspecified lack of coordination: Secondary | ICD-10-CM | POA: Insufficient documentation

## 2012-06-18 NOTE — Progress Notes (Signed)
Physical Therapy Treatment Patient Details  Name: Jordan [name] MRN: [id number] Date of Birth: 1923-06-17  Today's Date: 06/18/2012 Time: 7829-5621 PT Time Calculation (min): 39 min  Visit#: 3  of 8   Re-eval: 06/28/12    Authorization: medicare     Authorization Visit#: 3  of 8    Subjective: Symptoms/Limitations Symptoms: Pt states he is not sure if he can tell any difference in his balance or not yet.   Exercise/Treatments  Balance Exercises Standing Standing Eyes Closed: Foam;3 reps;30 secs;Narrow base of support (BOS) SLS: Eyes open;3 reps;Limitations SLS Limitations: R 50", L 53" max of 3 (no hands) Tandem Gait: Forward;Limitations Tandem Gait Limitations: 2RT on foam Retro Gait: Limitations Retro Gait Limitations: 2 RT on foam Sidestepping: 2 reps;Theraband Numbers 1-15: Foam;1 rep Cone Rotation: Foam Marching: Solid surface;10 reps;Limitations Marching Limitations: no hands Heel Raises: 15 reps;Limitations Heel Raises Limitations: no hands Toe Raise: 15 reps;Other (comment) Other Standing Exercises: Postural correction Other Standing Exercises: tandem stance x 1 min R then L in front.     Seated Other Seated Exercises: sit to stand x 10 R in back; L in back       Physical Therapy Assessment and Plan PT Assessment and Plan Clinical Impression Statement: Began foam tandem, retro walking as well as marching in place.   Pt has improve in SLS significantly since last treatment.  PT Plan: begin hurdles next treatment.    Goals Home Exercise Program PT Goal: Perform Home Exercise Program - Progress: Met PT Short Term Goals PT Short Term Goal 1 - Progress: Met PT Long Term Goals PT Long Term Goal 1 - Progress: Partly met PT Long Term Goal 2 - Progress: Progressing toward goal Long Term Goal 3 Progress: Progressing toward goal Long Term Goal 4 Progress: Progressing toward goal  Problem List Patient Active Problem List  Diagnosis  . Balance disorder   . Constipation       GP    Joslin Doell,CINDY 06/18/2012, 4:27 PM

## 2012-06-20 ENCOUNTER — Ambulatory Visit (HOSPITAL_COMMUNITY)
Admission: RE | Admit: 2012-06-20 | Discharge: 2012-06-20 | Disposition: A | Discharge status: Home / Self Care | Payer: Medicare Other | Source: Ambulatory Visit | Attending: Internal Medicine | Admitting: Internal Medicine

## 2012-06-20 NOTE — Progress Notes (Signed)
Physical Therapy Treatment Patient Details  Name: Jordan [name] MRN: [id number] Date of Birth: [birth year]  Today's Date: 06/20/2012 Time: 4098-1191 PT Time Calculation (min): 40 min Visit#: 4  of 8   Re-eval: 06/28/12 Authorization: medicare  Authorization Visit#: 4  of 8   Charges:  NMR 38'  Subjective: Symptoms/Limitations Symptoms: Pt. states he feels most off balance when just walking normally.  States he's been trying to pay more attention to gazing forward when ambulation.   Exercise/Treatments Balance Exercises Standing SLS with Vectors: 5 reps;Solid surface;Limitations SLS with Vectors Limitations: 5" holds Bilaterally Tandem Gait: Forward;Limitations;Retro Tandem Gait Limitations: 2RT on foam Sidestepping: 2 reps;Limitations Sidestepping Limitations: on balance beam Numbers 1-15: Foam;1 rep Cone Rotation: Foam Marching: Foam;10 reps Marching Limitations: no hands Heel Raises: 15 reps;Limitations Heel Raises Limitations: no hands Toe Raise: 15 reps;Other (comment) Other Standing Exercises: gait working on arm swing and forward gaze  Seated Other Seated Exercises: sit to stand x 10 R in back; L in back    Physical Therapy Assessment and Plan PT Assessment and Plan Clinical Impression Statement: Began marching and cone rotation on foam; hurdles with reciprocal step overs both 6 and 12 inches.  PT. with good form/control transferring sit<-->stand.  Worked on gait with arm swing and fwd gaze to increase confidence and stability.  Balance beam most difficult task, however no LOB occurred.   PT Plan: Continue to progress balance and stabilility; normalize gait.     Problem List Patient Active Problem List  Diagnosis  . Balance disorder  . Constipation    PT - End of Session Equipment Utilized During Treatment: Gait belt Activity Tolerance: Patient tolerated treatment well General Behavior During Session: [facility] for tasks performed Cognition: [facility] for tasks  performed   Lurena Nida, PTA/CLT 06/20/2012, 3:35 PM

## 2012-07-02 ENCOUNTER — Ambulatory Visit (HOSPITAL_COMMUNITY)
Admission: RE | Admit: 2012-07-02 | Discharge: 2012-07-02 | Disposition: A | Discharge status: Home / Self Care | Payer: Medicare Other | Source: Ambulatory Visit | Attending: Internal Medicine | Admitting: Internal Medicine

## 2012-07-02 ENCOUNTER — Encounter: Payer: Self-pay | Admitting: Gastroenterology

## 2012-07-02 ENCOUNTER — Ambulatory Visit (INDEPENDENT_AMBULATORY_CARE_PROVIDER_SITE_OTHER): Payer: Medicare Other | Admitting: Gastroenterology

## 2012-07-02 VITALS — BP 121/64 | HR 62 | Temp 98.5°F | Ht 69.0 in | Wt 156.0 lb

## 2012-07-02 DIAGNOSIS — K5900 Constipation, unspecified: Secondary | ICD-10-CM

## 2012-07-02 DIAGNOSIS — R2689 Other abnormalities of gait and mobility: Secondary | ICD-10-CM

## 2012-07-02 MED ORDER — POLYETHYLENE GLYCOL 3350 17 GM/SCOOP PO POWD: 17.0000 g | Freq: Every day | ORAL | Status: DC

## 2012-07-02 NOTE — Progress Notes (Signed)
Physical Therapy Re-evaluation/treatment  Patient Details  Name: Jordan [name] MRN: [id number] Date of Birth: [birth year]  Today's Date: 07/02/2012 Time: 4098-1191 PT Time Calculation (min): 44 min  Visit#: 5  of 8   Re-eval: 06/28/12 Assessment Diagnosis: balance disorder Charge: PPT (BERG) x 23', NMR 15'  Authorization: Medicare  Authorization Time Period:    Authorization Visit#: 5  of 8    Subjective Symptoms/Limitations Symptoms: Pt stated he is pain free today, feels his balance is improving.   How long can you walk comfortably?: He can walk for 30 minutes comfortably. Pain Assessment Currently in Pain?: No/denies  Objective:   Sensation/Coordination/Flexibility/Functional Tests Functional Tests Functional Tests: ABC 80% (ABC % was 75.6%)   Exercise/Treatments Mobility/Balance  Berg Balance Test Sit to Stand: Able to stand without using hands and stabilize independently Standing Unsupported: Able to stand safely 2 minutes Sitting with Back Unsupported but Feet Supported on Floor or Stool: Able to sit safely and securely 2 minutes Stand to Sit: Sits safely with minimal use of hands (was 3) Transfers: Able to transfer safely, minor use of hands (was 3) Standing Unsupported with Eyes Closed: Able to stand 10 seconds safely (was 3) Standing Ubsupported with Feet Together: Able to place feet together independently and stand 1 minute safely (was 3) From Standing, Reach Forward with Outstretched Arm: Can reach confidently >25 cm (10") From Standing Position, Pick up Object from Floor: Able to pick up shoe safely and easily From Standing Position, Turn to Look Behind Over each Shoulder: Looks behind from both sides and weight shifts well (was 3) Turn 360 Degrees: Able to turn 360 degrees safely in 4 seconds or less Standing Unsupported, Alternately Place Feet on Step/Stool: Able to stand independently and safely and complete 8 steps in 20 seconds Standing Unsupported,  One Foot in Front: Able to plae foot ahead of the other independently and hold 30 seconds (was 3) Standing on One Leg: Able to lift leg independently and hold > 10 seconds (was 2) Total Score: 55    Balance Exercises Standing Tandem Stance: Eyes open;2 reps;30 secs SLS Limitations: R 32", L 30" 1 attempt (06/18/2012 highest SLS score R 50", L 53") Tandem Gait: Forward;Limitations Tandem Gait Limitations: 2RT on static floor review HEP Sidestepping: Limitations Sidestepping Limitations: 2 RT on floor with head turns for HEP Review Heel Raises: Limitations Heel Raises Limitations: heel and toe walking 2 RT reviewing HEP  Seated Other Seated Exercises: sit to stand x 10 R in back; L in back   Physical Therapy Assessment and Plan PT Assessment and Plan Clinical Impression Statement: Re-eval complete per re-eval date.  Mr Ehmke has had 5 OPPT sessions over 4 weeks with the following findings: Pt has met 1/1 STGs and 2/4 LTGs and progressing towards all goals.  Pt with imporved balance, scored a 55/56 on BERG balance test and is able to SLS > 30" BLE.  Improved posture and body awareness, pt not longer requires visual cueing for shoulders directly over hips.  Pt has been independent wtth current HEP for posture strengthening, pt able to demonstrate appropriate technique safely with advanced HEP given to him this session to improve his balance with no LOB episodes. PT Plan: D/C to HEP per all goals met/progressing.      Goals Home Exercise Program Pt will Perform Home Exercise Program: Independently PT Goal: Perform Home Exercise Program - Progress: Met PT Short Term Goals Time to Complete Short Term Goals: 2 weeks PT Short Term Goal 1:  Pt SLS to be over 10 sec B to allow pt to feel more stable. PT Short Term Goal 1 - Progress: Met PT Long Term Goals Time to Complete Long Term Goals: 4 weeks PT Long Term Goal 1: Pt ABC score to go up 10% PT Long Term Goal 1 - Progress: Progressing toward  goal (ABC score 80%, increased 5%) PT Long Term Goal 2: Pt to be I in advance HEP PT Long Term Goal 2 - Progress: Progressing toward goal Long Term Goal 3: Pt posture to have shoulders over hips not behind so pt feels more in balance Long Term Goal 3 Progress: Met (pt with improve body awareness) Long Term Goal 4: improve berg balance by 6 levels. Long Term Goal 4 Progress: Met (BERG 55/56)  Problem List Patient Active Problem List  Diagnosis  . Balance disorder  . Constipation    PT - End of Session Equipment Utilized During Treatment: Gait belt Activity Tolerance: Patient tolerated treatment well General Behavior During Session: [facility] for tasks performed Cognition: [facility] for tasks performed PT Plan of Care PT Home Exercise Plan: Balance training:  Tandem stance/gait, heel/toe walking, head turns with gait  GP Functional Limitation: Mobility: Walking and moving around Mobility: Walking and Moving Around Current Status ([id number]): At least 1 percent but less than 20 percent impaired, limited or restricted Mobility: Walking and Moving Around Goal Status [phone]): At least 1 percent but less than 20 percent impaired, limited or restricted Mobility: Walking and Moving Around Discharge Status [phone]): At least 1 percent but less than 20 percent impaired, limited or restricted  Juel Burrow 07/02/2012, 6:03 PM

## 2012-07-02 NOTE — Patient Instructions (Addendum)
Please have blood work done a few days before the scan.  I have ordered a CT scan of your belly.   Do not take Metformin the day of the scan. You may start taking it again 2 days later.   We will call you with the results. Continue to take Miralax daily to every other day as needed.   Have a Jordan [name] Christmas!

## 2012-07-04 ENCOUNTER — Ambulatory Visit (HOSPITAL_COMMUNITY): Payer: Medicare Other | Admitting: *Deleted

## 2012-07-05 ENCOUNTER — Ambulatory Visit (HOSPITAL_COMMUNITY)
Admission: RE | Admit: 2012-07-05 | Discharge: 2012-07-05 | Disposition: A | Discharge status: Home / Self Care | Payer: Medicare Other | Source: Ambulatory Visit | Attending: Gastroenterology | Admitting: Gastroenterology

## 2012-07-05 DIAGNOSIS — K5730 Diverticulosis of large intestine without perforation or abscess without bleeding: Secondary | ICD-10-CM | POA: Insufficient documentation

## 2012-07-05 DIAGNOSIS — M899 Disorder of bone, unspecified: Secondary | ICD-10-CM | POA: Insufficient documentation

## 2012-07-05 DIAGNOSIS — M949 Disorder of cartilage, unspecified: Secondary | ICD-10-CM | POA: Insufficient documentation

## 2012-07-05 DIAGNOSIS — N400 Enlarged prostate without lower urinary tract symptoms: Secondary | ICD-10-CM | POA: Insufficient documentation

## 2012-07-05 DIAGNOSIS — K4090 Unilateral inguinal hernia, without obstruction or gangrene, not specified as recurrent: Secondary | ICD-10-CM | POA: Insufficient documentation

## 2012-07-05 DIAGNOSIS — K5900 Constipation, unspecified: Secondary | ICD-10-CM

## 2012-07-05 MED ORDER — IOHEXOL 300 MG/ML  SOLN
100.0000 mL | Freq: Once | INTRAMUSCULAR | Status: AC | PRN
  Administered 2012-07-05: 100 mL via INTRAVENOUS

## 2012-07-08 NOTE — Progress Notes (Signed)
Quick Note:  CT done as pt would like to hold off on TCS. Change in bowel habits.  Prostatomegaly noted on exam last visit. CT notes prominence of left lobe of prostrate.  Please send this to PCP, and make sure they are aware.   Please let pt know a colonoscopy is still recommended in the future, but I would also like for to have his prostate evaluated. If he is still wanting to hold off on a TCS, that is his decision. I have discussed thoroughly with him my recommendations.  Return in 3 mos if still wanting to hold off on TCS. ______

## 2012-07-09 NOTE — Progress Notes (Signed)
Quick Note:  Pt is aware, he still does not want to schedule a tcs at this time.  Dawn, please cc pcp Darl Pikes, please nic ov in 3 months. ______

## 2012-07-09 NOTE — Progress Notes (Signed)
Faxed to Dr Ouida Sills, recall made

## 2012-07-09 NOTE — Assessment & Plan Note (Signed)
78 year old male with change in bowel habits over past few months. Addition of fiber and Miralax have helped tremendously. Notes 90% improvement. No overt rectal bleeding. On rectal exam at last visit, I noted an enlarged prostate. I feel he needs lower GI tract evaluation due to his hx of adenomatous polyps; his last TCS was in 2004. He is still reluctant to pursue this. He is, however, willing to obtain a CT scan to evaluate for any obvious mass or other etiology. Again, I have reviewed the reasons for pursuing a colonoscopy at this time. However, he continues to voice wanting to hold off on this. We will respect his wishes and schedule a CT in the interim.   Follow-up in 3 months. Continue Miralax daily prn High fiber diet CT scan abd/pelvis

## 2012-07-09 NOTE — Progress Notes (Signed)
Referring Provider: Carylon Perches, MD Primary Care Physician:  Carylon Perches, MD Primary GI: Dr. Jena Gauss    Chief Complaint  Patient presents with  . Follow-up    HPI:   78 year old male presenting in f/u for new onset constipation. I saw him in Nov 2013, with reports of anal irriatation and discomfort. No rectal bleeding. Frequent straining. Last TCS in 2004. Remote hx of adenomatous polyps. Rectal exam at that time with enlarged prostate. He has a hx of BPH. I also started him on Miralax prn, continue high fiber diet. At that time, I had also recommended a colonoscopy. He did not want to pursue this; therefore, he is here today to further discuss his symptoms.  States he is 90% better. Miralax usually daily. BM first thing in the morning then again later in the afternoon. No rectal bleeding. No abdominal pain. No further rectal discomfort. He is still not wanting to pursue a colonoscopy.    Past Medical History  Diagnosis Date  . Hypertension   . Hypercholesterolemia   . Diabetes mellitus   . CAD (coronary artery disease)     with stent  . BPH (benign prostatic hyperplasia)     Past Surgical History  Procedure Date  . Hernia repair     right groin  . Coronary angioplasty with stent placement     RCA  . Colonoscopy June 2004    Dr. Jarold Motto: notes hx of adenomatous polyps. no polyps at time of exam, noted pancolonic diverticula.     Current Outpatient Prescriptions  Medication Sig Dispense Refill  . aspirin 81 MG tablet Take 81 mg by mouth daily.      Marland Kitchen donepezil (ARICEPT) 10 MG tablet Take 10 mg by mouth at bedtime as needed.      . enalapril (VASOTEC) 10 MG tablet Take 10 mg by mouth daily.      . hydrochlorothiazide (MICROZIDE) 12.5 MG capsule Take 12.5 mg by mouth daily.      . hydrocortisone (ANUSOL-HC) 2.5 % rectal cream Place rectally 2 (two) times daily. For 10 days.  30 g  1  . metFORMIN (GLUCOPHAGE) 500 MG tablet Take 500 mg by mouth 2 (two) times daily with a meal.       . Multiple Vitamins-Minerals (OCUVITE PRESERVISION PO) Take by mouth daily.      . polyethylene glycol powder (GLYCOLAX/MIRALAX) powder Take 17 g by mouth daily. For a bowel movement  255 g  3  . pravastatin (PRAVACHOL) 80 MG tablet Take 80 mg by mouth daily.      . Tamsulosin HCl (FLOMAX) 0.4 MG CAPS Take 0.4 mg by mouth daily.      Marland Kitchen lidocaine-hydrocortisone (ANAMANTLE) 3-1 % KIT Place 1 application rectally 2 (two) times daily. For 7 days  1 each  0    Allergies as of 07/02/2012  . (No Known Allergies)    Family History  Problem Relation Age of Onset  . Colon cancer Neg Hx     History   Social History  . Marital Status: Widowed    Spouse Name: [name]    Number of Children: [name]  . Years of Education: [name]   Social History Main Topics  . Smoking status: Former Games developer  . Smokeless tobacco: None     Comment: Quit in 1984  . Alcohol Use: No  . Drug Use: No  . Sexually Active: None   Other Topics Concern  . None   Social History Narrative  . None  Review of Systems: Negative unless otherwise mentioned in HPI.   Physical Exam: BP 121/64  Pulse 62  Temp 98.5 F (36.9 C) (Oral)  Ht 5\' 9"  (1.753 m)  Wt 156 lb (70.761 kg)  BMI 23.04 kg/m2 General:   Alert and oriented. No distress noted. Pleasant and cooperative.  Head:  Normocephalic and atraumatic. Eyes:  Conjuctiva clear without scleral icterus. Mouth:  Oral mucosa pink and moist. Good dentition. No lesions. Heart:  S1, S2 present without murmurs, rubs, or gallops. Regular rate and rhythm. Abdomen:  +BS, soft, non-tender and non-distended. No rebound or guarding. No HSM or masses noted. Msk:  Symmetrical without gross deformities. Normal posture. Extremities:  Without edema. Neurologic:  Alert and  oriented x4;  grossly normal neurologically. Skin:  Intact without significant lesions or rashes. Psych:  Alert and cooperative. Normal mood and affect.

## 2012-07-11 ENCOUNTER — Ambulatory Visit (HOSPITAL_COMMUNITY): Payer: Medicare Other

## 2012-07-15 ENCOUNTER — Encounter: Payer: Self-pay | Admitting: Internal Medicine

## 2012-07-15 NOTE — Progress Notes (Signed)
Faxed to PCP

## 2012-07-15 NOTE — Progress Notes (Signed)
Pt is aware of OV on 3/7 at 11 with RMR and appt card was mailed

## 2012-07-16 ENCOUNTER — Ambulatory Visit (HOSPITAL_COMMUNITY): Payer: Medicare Other | Admitting: Physical Therapy

## 2012-07-18 ENCOUNTER — Ambulatory Visit (HOSPITAL_COMMUNITY): Payer: Medicare Other

## 2012-07-19 ENCOUNTER — Encounter: Payer: Self-pay | Admitting: Gastroenterology

## 2012-09-20 ENCOUNTER — Ambulatory Visit: Payer: Medicare Other | Admitting: Internal Medicine

## 2012-09-24 ENCOUNTER — Telehealth: Payer: Self-pay | Admitting: Internal Medicine

## 2012-09-24 NOTE — Telephone Encounter (Signed)
Pt had OV that was cancelled due to the weather and he said that he was feeling fine and didn't want to [facility] at this time. He will call us if needed.

## 2012-09-24 NOTE — Telephone Encounter (Signed)
Noted  

## 2012-11-19 ENCOUNTER — Ambulatory Visit (INDEPENDENT_AMBULATORY_CARE_PROVIDER_SITE_OTHER): Payer: Medicare Other | Admitting: Urology

## 2012-11-19 DIAGNOSIS — N281 Cyst of kidney, acquired: Secondary | ICD-10-CM

## 2012-11-19 DIAGNOSIS — N400 Enlarged prostate without lower urinary tract symptoms: Secondary | ICD-10-CM

## 2012-11-19 DIAGNOSIS — K4090 Unilateral inguinal hernia, without obstruction or gangrene, not specified as recurrent: Secondary | ICD-10-CM

## 2012-12-31 ENCOUNTER — Other Ambulatory Visit (HOSPITAL_COMMUNITY): Payer: Self-pay | Admitting: Internal Medicine

## 2012-12-31 ENCOUNTER — Ambulatory Visit (HOSPITAL_COMMUNITY)
Admission: RE | Admit: 2012-12-31 | Discharge: 2012-12-31 | Disposition: A | Discharge status: Home / Self Care | Payer: Medicare Other | Source: Ambulatory Visit | Attending: Internal Medicine | Admitting: Internal Medicine

## 2012-12-31 DIAGNOSIS — R079 Chest pain, unspecified: Secondary | ICD-10-CM

## 2013-11-11 ENCOUNTER — Ambulatory Visit (HOSPITAL_COMMUNITY)
Admission: RE | Admit: 2013-11-11 | Discharge: 2013-11-11 | Disposition: A | Discharge status: Home / Self Care | Payer: Medicare Other | Source: Ambulatory Visit | Attending: Internal Medicine | Admitting: Internal Medicine

## 2013-11-11 DIAGNOSIS — R269 Unspecified abnormalities of gait and mobility: Secondary | ICD-10-CM | POA: Insufficient documentation

## 2013-11-11 DIAGNOSIS — IMO0001 Reserved for inherently not codable concepts without codable children: Secondary | ICD-10-CM | POA: Insufficient documentation

## 2013-11-11 NOTE — Evaluation (Signed)
Physical Therapy Evaluation  Patient Details  Name: Jordan [name] MRN: [id number] Date of Birth: [birth year]  Today's Date: 11/11/2013 Time: 4696-2952 PT Time Calculation (min): 37 min              Visit#: 1 of 8  Re-eval: 12/11/13 Assessment Diagnosis: gait disturbance Prior Therapy: 2013  Authorization: medicare blue     Past Medical History:  Past Medical History  Diagnosis Date  . Hypertension   . Hypercholesterolemia   . Diabetes mellitus   . CAD (coronary artery disease)     with stent  . BPH (benign prostatic hyperplasia)   . Adenoma of large intestine    Past Surgical History:  Past Surgical History  Procedure Laterality Date  . Hernia repair      right groin  . Coronary angioplasty with stent placement      RCA  . Colonoscopy  June 2004    Dr. Sharlett Iles: notes hx of adenomatous polyps. no polyps at time of exam, noted pancolonic diverticula.     Subjective Symptoms/Limitations Symptoms: Mr. Amedee has beeen seen in 2013 for gait disturbance.  He was seen a total of five treatments with improvement and was discharged to a HEP.  He states he continued the exercises for a period of time and then stopped.  Mr. Hitz states he is able to walk but he startes to veers off to one direction.  He states this is not obligatory to the right or left it varies.   How long can you sit comfortably?: no problem How long can you stand comfortably?: able to stand for as long as he likes How long can you walk comfortably?: Pt states he is able to walk as long as he wants.  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[employer] , [employer]    Sensation/Coordination/Flexibility/Functional Tests Functional Tests Functional Tests: foto 97  Assessment RLE Strength Right Hip Extension: 5/5 Right Hip ABduction: 5/5 Right Hip ADduction: 5/5 Right Knee  Flexion: 5/5 Right Knee Extension: 5/5 Right Ankle Dorsiflexion: 4/5 Right Ankle Plantar Flexion: 5/5 LLE Strength Left Hip Extension: 5/5 Left Hip ABduction: 5/5 Left Hip ADduction: 5/5 Left Knee Flexion: 5/5 Left Knee Extension: 5/5 Left Ankle Dorsiflexion: 4/5 Left Ankle Plantar Flexion: 5/5  Exercise/Treatments Mobility/Balance  Berg Balance Test Sit to Stand: Able to stand without using hands and stabilize independently Standing Unsupported: Able to stand safely 2 minutes Sitting with Back Unsupported but Feet Supported on Floor or Stool: Able to sit safely and securely 2 minutes Stand to Sit: Sits safely with minimal use of hands Transfers: Able to transfer safely, minor use of hands Standing Unsupported with Eyes Closed: Able to stand 10 seconds safely Standing Ubsupported with Feet Together: Able to place feet together independently and stand for 1 minute with supervision From Standing, Reach Forward with Outstretched Arm: Can reach confidently >25 cm (10") From Standing Position, Pick up Object from Floor: Able to pick up shoe safely and easily From Standing Position, Turn to Look Behind Over each Shoulder: Looks behind one side only/other side shows less weight shift Turn 360 Degrees: Able to turn 360 degrees safely one side only in 4 seconds or less Standing Unsupported, Alternately Place Feet on Step/Stool: Able to stand independently and safely and complete 8 steps in 20 seconds Standing Unsupported, One Foot in Front: Able to plae foot ahead of the other  independently and hold 30 seconds Standing on One Leg: Tries to lift leg/unable to hold 3 seconds but remains standing independently Total Score: 49   Balance Exercises Standing SLS: Eyes open;2 reps SLS with Vectors: Solid surface;2 reps;Time SLS with Vectors Time: 3 seconds. Balance Beam: forward, retro x 2 RT     Physical Therapy Assessment and Plan PT Assessment and Plan Clinical Impression Statement: Pt is  an 80 yo male who states he has noticed that as he walks he begins to drift to the right or left.  He states that he drifts equally to the right as to the left and at times runs into the wall.  Pt posture keeps his center of graviity slightly behind his base of support.  Pt has increased balance difficulties with narrow base of support.  Pt will benefit from skilled PT to improve his balance and decrease his risk of falling. Pt will benefit from skilled therapeutic intervention in order to improve on the following deficits: Difficulty walking;Decreased balance Rehab Potential: Good PT Frequency: Min 2X/week PT Duration: 4 weeks PT Treatment/Interventions: Patient/family education;Neuromuscular re-education;Balance training PT Plan: Pt will benefit from narrow base of support challenges as well as eyes closed.      Goals Home Exercise Program Pt/caregiver will Perform Home Exercise Program: For improved balance PT Goal: Perform Home Exercise Program - Progress: Goal set today PT Short Term Goals Time to Complete Short Term Goals: 2 weeks PT Short Term Goal 1: Pt to state that he can tell that his drifting when walking has improved by 30% PT Long Term Goals Time to Complete Long Term Goals: 4 weeks PT Long Term Goal 1: Pt to have had no falls PT Long Term Goal 2: PT to state his drifting has improved by 60% Long Term Goal 3: Berg to be improved 5 points  Problem List Patient Active Problem List   Diagnosis Date Noted  . Constipation 06/03/2012  . Balance disorder 05/29/2012    PT - End of Session Equipment Utilized During Treatment: Gait belt PT Plan of Care PT Home Exercise Plan: given  GP Functional Limitation: Mobility: Walking and moving around Mobility: Walking and Moving Around Current Status (H3716): At least 1 percent but less than 20 percent impaired, limited or restricted Mobility: Walking and Moving Around Goal Status [phone]): 0 percent impaired, limited or  restricted  Leeroy Cha 11/11/2013, 4:17 PM  Physician Documentation Your signature is required to indicate approval of the treatment plan as stated above.  Please sign and either send electronically or make a copy of this report for your files and return this physician signed original.   Please mark one 1.__approve of plan  2. ___approve of plan with the following conditions.   ______________________________                                                          _____________________ Physician Signature  Date  

## 2013-11-18 ENCOUNTER — Ambulatory Visit (HOSPITAL_COMMUNITY)
Admission: RE | Admit: 2013-11-18 | Discharge: 2013-11-18 | Disposition: A | Discharge status: Home / Self Care | Payer: Medicare Other | Source: Ambulatory Visit | Attending: Internal Medicine | Admitting: Internal Medicine

## 2013-11-18 DIAGNOSIS — IMO0001 Reserved for inherently not codable concepts without codable children: Secondary | ICD-10-CM | POA: Insufficient documentation

## 2013-11-18 DIAGNOSIS — R269 Unspecified abnormalities of gait and mobility: Secondary | ICD-10-CM | POA: Insufficient documentation

## 2013-11-18 NOTE — Progress Notes (Signed)
Physical Therapy Treatment Patient Details  Name: Jordan [name] MRN: [id number] Date of Birth: 1923/03/24  Today's Date: 11/18/2013 Time: 1515-1600 PT Time Calculation (min): 45 min  Charges: TherEx 1515 - 1530, gait Training 1530-1600 Visit#: 2 of 8  Re-eval: 12/11/13 Assessment Diagnosis: gait disturbance Prior Therapy: 2013  Authorization: medicare blue  Authorization Time Period:    Authorization Visit#: 1 of 8   Subjective: Symptoms/Limitations Symptoms: Patient states that he feels good and is worred about falling at home.   Exercise/Treatments Balance Exercises Standing SLS: Eyes open;2 reps Step Over Hurdles / Cones: forward/backwards, side to side 10x each Other Standing Exercises: 6" forward and side step ups 10x each Other Standing Exercises: high Knee marching 10x each, Lunge to 8" box 10x with groin stretch  with 3" hold, Lunge to 8" box 10x with hip flexor stretch with 3" hold    Seated Other Seated Exercises: NuStep 10' Level 2 with arms and hands for sequencing and to increase exercise tolerance   Physical Therapy Assessment and Plan PT Assessment and Plan Clinical Impression Statement: Patient displays a positive response therEx focusign on weight shifting through multiple planes of motion and improving single leg balance with improved drift durign gait and decreased scissoring of feet when walking.  Pt will benefit from skilled therapeutic intervention in order to improve on the following deficits: Difficulty walking;Decreased balance Rehab Potential: Good PT Treatment/Interventions: Patient/family education;Neuromuscular re-education;Balance training PT Plan: Pt will benefit from narrow base of support challenges as well as eyes closed and groin stretching and frontal plane weight shifting activities to contineu to decrease scissoring of gait.    Goals PT Short Term Goals PT Short Term Goal 1: Pt to state that he can tell that his drifting when walking has  improved by 30% PT Short Term Goal 1 - Progress: Progressing toward goal PT Long Term Goals PT Long Term Goal 1: Pt to have had no falls PT Long Term Goal 1 - Progress: Progressing toward goal PT Long Term Goal 2: PT to state his drifting has improved by 60% PT Long Term Goal 2 - Progress: Progressing toward goal Long Term Goal 3: Berg to be improved 5 points Long Term Goal 3 Progress: Progressing toward goal  Problem List Patient Active Problem List   Diagnosis Date Noted  . Constipation 06/03/2012  . Balance disorder 05/29/2012    PT - End of Session Equipment Utilized During Treatment: Gait belt  GP    Allyssia Skluzacek R Trelyn Vanderlinde 11/18/2013, 7:25 PM

## 2013-11-20 ENCOUNTER — Ambulatory Visit (HOSPITAL_COMMUNITY)
Admission: RE | Admit: 2013-11-20 | Discharge: 2013-11-20 | Disposition: A | Discharge status: Home / Self Care | Payer: Medicare Other | Source: Ambulatory Visit | Attending: Internal Medicine | Admitting: Internal Medicine

## 2013-11-20 NOTE — Progress Notes (Signed)
Physical Therapy Treatment Patient Details  Name: Jordan [name] MRN: [id number] Date of Birth: [birth year]  Today's Date: 11/20/2013 Time: 1430-1509 PT Time Calculation (min): 39 min Charge:  Neuromuscular re-ed [id number]; there ex [id number] Visit#: 3 of 8  Re-eval: 12/11/13    Authorization: medicare blue     Authorization Visit#: 3 of 8   Subjective: Symptoms/Limitations Symptoms: PT states he has been doing the exercises at home and only has trouble with the SLS    Exercise/Treatments  Balance Exercises Standing Tandem Stance: Eyes open;2 reps;Limitations Tandem Stance Limitations: with UE B flexion. Standing, One Foot on a Step: Eyes open;Limitations Standing, One Foot on a Step Limitations: 14 " step; with head turns and head nods x 10 Wall Bumps: Shoulder;Hips;10 reps Balance Beam: forward, backward x 10 Tandem Gait: 2 reps Retro Gait: 2 reps  : standing SB to improve trunk motionl Other Standing Exercises: standing on wobble board with head turns; rockerboard x 1' Other Standing Exercises: feet shoulder width then together lean forward/back then side to side as far as possible x 10   Yoga Poses Tree Pose: 2 reps;30 seconds  Seated Other Seated Exercises: nustep L4 x 8'     Physical Therapy Assessment and Plan PT Assessment and Plan Clinical Impression Statement: Pt needed therapist facilitation with new exercises with head turns as pt loses his balance easily.   PT Plan: begin 3 way hip to increase ROM ; stop tandem gt at line continue tandem gt on beam.    Goals  progressing  Problem List Patient Active Problem List   Diagnosis Date Noted  . Constipation 06/03/2012  . Balance disorder 05/29/2012    PT - End of Session Equipment Utilized During Treatment: Gait belt  GP    Leeroy Cha 11/20/2013, 3:12 PM

## 2013-11-24 ENCOUNTER — Ambulatory Visit (HOSPITAL_COMMUNITY): Payer: Medicare Other | Admitting: Physical Therapy

## 2013-11-27 ENCOUNTER — Ambulatory Visit (HOSPITAL_COMMUNITY)
Admission: RE | Admit: 2013-11-27 | Discharge: 2013-11-27 | Disposition: A | Discharge status: Home / Self Care | Payer: Medicare Other | Source: Ambulatory Visit | Attending: Internal Medicine | Admitting: Internal Medicine

## 2013-11-27 NOTE — Progress Notes (Signed)
Physical Therapy Treatment Patient Details  Name: Jordan [name] MRN: [id number] Date of Birth: [birth year]  Today's Date: 11/27/2013 Time: 9373-4287 PT Time Calculation (min): 71 min Charge neuromusclar 1430-1505; there ex [id number] Visit#: 4 of 8  Re-eval: 12/11/13    Authorization: medicare blue     Authorization Visit#: 4 of 8   Subjective: Symptoms/Limitations Symptoms: Pt states he continues to do his exercises at home    Exercise/Treatments  Balance Exercises Standing Standing Eyes Opened: Other reps (comment) (B UE flex x 10) SLS with Vectors: Solid surface;3 reps;10 secs Balance Beam: forward, backward x 10 Gait with Head Turns (Round Trips): on balance beam for forward and side step x 10 Step Over Hurdles / Cones: forward x 2 RT Marching: Solid surface;10 reps Left / Chop Limitations: standing SB to improve trunk motionl Other Standing Exercises: standing on wobble board with head turns; rockerboard x 2' both Rt/Lt and Ant/post'  Yoga Poses Tree Pose: 2 reps;30 seconds  Seated Other Seated Exercises: nustep L4 x 10'     Physical Therapy Assessment and Plan PT Assessment and Plan Clinical Impression Statement: Pt tends so lose his balance with center of gravity going behind base of support.  Pt also tends to stand like this pt educated in trying to correct posture. Pt needed minimal assist from therapist frequently to regain balance PT Plan: continue with head turns as this tends to challenge pt balance.     Goals  progressing  Problem List Patient Active Problem List   Diagnosis Date Noted  . Constipation 06/03/2012  . Balance disorder 05/29/2012       GP    Leeroy Cha 11/27/2013, 3:17 PM

## 2013-12-01 ENCOUNTER — Ambulatory Visit (HOSPITAL_COMMUNITY)
Admission: RE | Admit: 2013-12-01 | Discharge: 2013-12-01 | Disposition: A | Discharge status: Home / Self Care | Payer: Medicare Other | Source: Ambulatory Visit | Attending: Physical Therapy | Admitting: Physical Therapy

## 2013-12-01 NOTE — Progress Notes (Signed)
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  Tandem Stance: Eyes open;30 secs;Limitations Tandem Stance Limitations: with head turns. SLS with Vectors: Solid surface;3 reps;10 secs Gait with Head Turns (Round Trips): on balance beam forward, retro x 2 RT Numbers 1-15: Limitations Numbers 1-15 Limitations: on wobble board  Marching: Solid surface;10 reps;Limitations Marching Limitations: eyes closed. Other Standing Exercises: rocker board both RT/LT and anterior/posterior. Other Standing Exercises: 3 way hip excursion x 3   Yoga Poses Warrior III: 3 reps;30 seconds     Physical Therapy Assessment and Plan PT Assessment and Plan Clinical Impression Statement: Pt continues to have LOB with head turns, also demonstrates decreased cervical ROM with head turns.   PT Plan: continue with head turns as this tends to challenge pt balance.     Goals    Problem List Patient Active Problem List   Diagnosis Date Noted  . Constipation 06/03/2012  . Balance disorder 05/29/2012    PT - End of Session Equipment Utilized During Treatment: Gait belt  GP    Leeroy Cha 12/01/2013, 3:19 PM

## 2013-12-04 ENCOUNTER — Ambulatory Visit (HOSPITAL_COMMUNITY)
Admission: RE | Admit: 2013-12-04 | Discharge: 2013-12-04 | Disposition: A | Discharge status: Home / Self Care | Payer: Medicare Other | Source: Ambulatory Visit | Attending: Internal Medicine | Admitting: Internal Medicine

## 2013-12-04 NOTE — Progress Notes (Signed)
Physical Therapy Treatment Patient Details  Name: Jordan [name] MRN: [id number] Date of Birth: 1923-05-26  Today's Date: 12/04/2013 Time: 1751-0258 PT Time Calculation (min): 42 min Charge:  Neuromuscular re ed [id number] Visit#: 6 of 8  Re-eval: 12/11/13    Authorization: medicare blue  Authorization Visit#: 6 of 8   Subjective: Symptoms/Limitations Symptoms: Pt states he is unsure of his progress   Exercise/Treatments      Balance Exercises Standing Standing Eyes Opened: Narrow base of support (BOS);Limitations Standing Eyes Opened Limitations: shift wt as far as possible both Rt/LT and anterior/postrerior Standing Eyes Closed: Wide (BOA);5 reps;Limitations Standing Eyes Closed Limitations: shifting weight both Rt/Lt and anterior/posterior.  Tandem Stance: Eyes closed;3 reps;10 secs;Limitations Tandem Stance Limitations: with head turns. SLS with Vectors: Solid surface;3 reps;10 secs;Limitations SLS with Vectors Time: eyes closed Balance Beam: forward count by 3's ; retro count by 4's Gait with Head Turns (Round Trips): on balance beam forward, retro x 2 RT Step Over Hurdles / Cones: on balance beam Numbers 1-15: Limitations Numbers 1-15 Limitations: on wobble board  Marching: Solid surface;10 reps;Limitations Marching Limitations: eyes closed. Other Standing Exercises: 3 way hip excursion x 3      Physical Therapy Assessment and Plan PT Assessment and Plan Clinical Impression Statement: Pt has increased difficulty with eyes closed as well as head turns  PT Plan: reassess next week        Problem List Patient Active Problem List   Diagnosis Date Noted  . Constipation 06/03/2012  . Balance disorder 05/29/2012       GP    Leeroy Cha 12/04/2013, 3:29 PM

## 2013-12-09 ENCOUNTER — Ambulatory Visit (HOSPITAL_COMMUNITY)
Admission: RE | Admit: 2013-12-09 | Discharge: 2013-12-09 | Disposition: A | Discharge status: Home / Self Care | Payer: Medicare Other | Source: Ambulatory Visit | Attending: Physical Therapy | Admitting: Physical Therapy

## 2013-12-09 NOTE — Progress Notes (Signed)
Physical Therapy Treatment Patient Details  Name: Jordan [name] MRN: [id number] Date of Birth: 08-09-1922  Today's Date: 12/09/2013 Time: 1540-0867 PT Time Calculation (min): 41 min Charge: neurore-ed [id number] Visit#: 7 of 8  Re-eval: 12/11/13    Authorization: medicare blue     Authorization Visit#: 7 of 8   Subjective: Symptoms/Limitations Symptoms: Pt does not feel he is progressing     Exercise/Treatments      Balance Exercises Standing Tandem Stance: Eyes closed Tandem Stance Limitations: head turns  SLS with Vectors: 10 secs;3 reps Balance Beam: over hurdles x 2 RT  Retro Gait: 2 reps (on balance beam) Sidestepping: 2 reps (on balance beams.) Numbers 1-15: Limitations Numbers 1-15 Limitations: on wobble board  Cone Rotation: Foam Other Standing Exercises: rocker  board Rt/Lt ; ant/post. x 2 ' each then try and stay in center.   Yoga Poses Warrior I: 3 reps;30 seconds Warrior III: 1 rep;15 seconds     Physical Therapy Assessment and Plan PT Assessment and Plan Clinical Impression Statement: Added yoga poses for increased challenge.  Pt lost balance several times throughout treatment needing minimum assist from therapist to regain balance . PT Plan: reassess next visit.     Problem List Patient Active Problem List   Diagnosis Date Noted  . Constipation 06/03/2012  . Balance disorder 05/29/2012       GP    Leeroy Cha 12/09/2013, 4:08 PM

## 2013-12-11 ENCOUNTER — Ambulatory Visit (HOSPITAL_COMMUNITY)
Admission: RE | Admit: 2013-12-11 | Discharge: 2013-12-11 | Disposition: A | Discharge status: Home / Self Care | Payer: Medicare Other | Source: Ambulatory Visit | Attending: Internal Medicine | Admitting: Internal Medicine

## 2013-12-11 NOTE — Evaluation (Signed)
Physical Therapy Evaluation  Patient Details  Name: Jordan [name] MRN: [id number] Date of Birth: 03/02/1923  Today's Date: 12/11/2013 Time: 1440-1510 PT Time Calculation (min): 30 min Charge:  Mm test; functional perfomance testing             Visit#: 8 of 8  Re-eval:   Assessment Diagnosis: gait disturbance Prior Therapy: 2013   Past Medical History:  Past Medical History  Diagnosis Date  . Hypertension   . Hypercholesterolemia   . Diabetes mellitus   . CAD (coronary artery disease)     with stent  . BPH (benign prostatic hyperplasia)   . Adenoma of large intestine    Past Surgical History:  Past Surgical History  Procedure Laterality Date  . Hernia repair      right groin  . Coronary angioplasty with stent placement      RCA  . Colonoscopy  June 2004    Dr. Sharlett Iles: notes hx of adenomatous polyps. no polyps at time of exam, noted pancolonic diverticula.     Subjective Symptoms/Limitations Symptoms: Pt does not feel he is progressing How long can you sit comfortably?: no problem How long can you stand comfortably?: able to stand for as long as he likes How long can you walk comfortably?: Pt states he is able to walk as long as he wants but he starts drifting to the right or left with no rhyme or reason.  He has not fallen.      Assessment RLE Strength Right Ankle Dorsiflexion: 5/5 (was 4/5) LLE Strength Left Ankle Dorsiflexion:  (4+/5 was 5/5)  Exercise/Treatments Mobility/Balance  Berg Balance Test Sit to Stand: Able to stand without using hands and stabilize independently Standing Unsupported: Able to stand safely 2 minutes Sitting with Back Unsupported but Feet Supported on Floor or Stool: Able to sit safely and securely 2 minutes Stand to Sit: Sits safely with minimal use of hands Transfers: Able to transfer safely, minor use of hands Standing Unsupported with Eyes Closed: Able to stand 10 seconds safely Standing Ubsupported with Feet Together:  Able to place feet together independently and stand 1 minute safely From Standing, Reach Forward with Outstretched Arm: Can reach confidently >25 cm (10") From Standing Position, Pick up Object from Floor: Able to pick up shoe safely and easily From Standing Position, Turn to Look Behind Over each Shoulder: Looks behind from both sides and weight shifts well Turn 360 Degrees: Able to turn 360 degrees safely in 4 seconds or less Standing Unsupported, Alternately Place Feet on Step/Stool: Able to stand independently and safely and complete 8 steps in 20 seconds Standing Unsupported, One Foot in Front: Able to place foot tandem independently and hold 30 seconds Standing on One Leg: Able to lift leg independently and hold > 10 seconds Total Score: 55  Was 49     Physical Therapy Assessment and Plan PT Assessment and Plan Clinical Impression Statement: Pt has maximized Berg test.  Strength is wfl.  Pt still feels as if he is drifting as he walks.  Pt was given HEP that included cervical ROM.  Pt encouraged to continue HEP for a month and if sx continued to persist to have MD refer pt back to therapy.  Improved objective but not subjective. PT Plan: discharge    Goals Home Exercise Program Pt/caregiver will Perform Home Exercise Program: For improved balance PT Goal: Perform Home Exercise Program - Progress: Met PT Short Term Goals Time to Complete Short Term Goals: 2 weeks PT  Short Term Goal 1: Pt to state that he can tell that his drifting when walking has improved by 30% PT Short Term Goal 1 - Progress: Not met PT Long Term Goals Time to Complete Long Term Goals: 4 weeks PT Long Term Goal 1: Pt to have had no falls PT Long Term Goal 1 - Progress: Met PT Long Term Goal 2: PT to state his drifting has improved by 60% PT Long Term Goal 2 - Progress: Not met Long Term Goal 3 Progress: Met  Problem List Patient Active Problem List   Diagnosis Date Noted  . Constipation 06/03/2012  .  Balance disorder 05/29/2012    PT - End of Session Equipment Utilized During Treatment: Gait belt PT Plan of Care PT Home Exercise Plan: new given  GP Functional Limitation: Mobility: Walking and moving around Mobility: Walking and Moving Around Goal Status [phone]): At least 1 percent but less than 20 percent impaired, limited or restricted Mobility: Walking and Moving Around Discharge Status [phone]): At least 1 percent but less than 20 percent impaired, limited or restricted  Leeroy Cha 12/11/2013, 5:35 PM  Physician Documentation Your signature is required to indicate approval of the treatment plan as stated above.  Please sign and either send electronically or make a copy of this report for your files and return this physician signed original.   Please mark one 1.__approve of plan  2. ___approve of plan with the following conditions.   ______________________________                                                          _____________________ Physician Signature                                                                                                             Date

## 2014-03-02 NOTE — Addendum Note (Signed)
Encounter addended by: Debby Bud, OT on: 03/02/2014 11:42 AM<BR>     Documentation filed: Episodes

## 2014-05-29 ENCOUNTER — Encounter (HOSPITAL_COMMUNITY): Payer: Self-pay

## 2014-10-16 ENCOUNTER — Encounter (HOSPITAL_COMMUNITY): Payer: Self-pay

## 2014-10-16 ENCOUNTER — Emergency Department (HOSPITAL_COMMUNITY)
Admission: EM | Admit: 2014-10-16 | Discharge: 2014-10-16 | Disposition: A | Discharge status: Home / Self Care | Payer: Medicare Other | Attending: Emergency Medicine | Admitting: Emergency Medicine

## 2014-10-16 DIAGNOSIS — I10 Essential (primary) hypertension: Secondary | ICD-10-CM | POA: Diagnosis not present

## 2014-10-16 DIAGNOSIS — E119 Type 2 diabetes mellitus without complications: Secondary | ICD-10-CM | POA: Diagnosis not present

## 2014-10-16 DIAGNOSIS — N400 Enlarged prostate without lower urinary tract symptoms: Secondary | ICD-10-CM | POA: Diagnosis not present

## 2014-10-16 DIAGNOSIS — Z86018 Personal history of other benign neoplasm: Secondary | ICD-10-CM | POA: Diagnosis not present

## 2014-10-16 DIAGNOSIS — Z9861 Coronary angioplasty status: Secondary | ICD-10-CM | POA: Insufficient documentation

## 2014-10-16 DIAGNOSIS — Z79899 Other long term (current) drug therapy: Secondary | ICD-10-CM | POA: Diagnosis not present

## 2014-10-16 DIAGNOSIS — K5900 Constipation, unspecified: Secondary | ICD-10-CM | POA: Diagnosis not present

## 2014-10-16 DIAGNOSIS — I2510 Atherosclerotic heart disease of native coronary artery without angina pectoris: Secondary | ICD-10-CM | POA: Diagnosis not present

## 2014-10-16 DIAGNOSIS — Z7952 Long term (current) use of systemic steroids: Secondary | ICD-10-CM | POA: Diagnosis not present

## 2014-10-16 DIAGNOSIS — Z87891 Personal history of nicotine dependence: Secondary | ICD-10-CM | POA: Diagnosis not present

## 2014-10-16 DIAGNOSIS — E780 Pure hypercholesterolemia: Secondary | ICD-10-CM | POA: Diagnosis not present

## 2014-10-16 DIAGNOSIS — Z7982 Long term (current) use of aspirin: Secondary | ICD-10-CM | POA: Diagnosis not present

## 2014-10-16 MED ORDER — BISACODYL 10 MG RE SUPP
10.0000 mg | Freq: Once | RECTAL | Status: AC
  Administered 2014-10-16: 10 mg via RECTAL
  Filled 2014-10-16: qty 1

## 2014-10-16 NOTE — ED Notes (Signed)
Ambulated pt to restroom

## 2014-10-16 NOTE — ED Notes (Signed)
Pt. Reports that he has swollen hemorrhoids and that is preventing him from moving his bowels.   Pt. Attempts to go to the bathroom, but is unsuccessful and just feels pressure.  He has not had a normal BM in 3 days.  Pt. Denies any bleeding.

## 2014-10-16 NOTE — ED Notes (Signed)
Dr. Ashok Cordia in room with pt and daughter to explain discharge.

## 2014-10-16 NOTE — ED Notes (Signed)
Daughter became very upset that pt was being discharged this RN explained discharge instructions and medications to take at home. Offered to have Dr.steinl come back in if they had any more questions.

## 2014-10-16 NOTE — ED Notes (Signed)
Dr. Ashok Cordia is made aware that daughter is refusing to leave at this time. She states she doesn't feel he can make the ride back to [place] at this time because he is uncomfortable sitting.

## 2014-10-16 NOTE — ED Provider Notes (Signed)
CSN: [id number]     Arrival date & time 10/16/14  1048 History   First MD Initiated Contact with Patient 10/16/14 1247     Chief Complaint  Patient presents with  . Constipation     (Consider location/radiation/quality/duration/timing/severity/associated sxs/prior Treatment) Patient is a 81 y.o. male presenting with constipation. The history is provided by the patient.  Constipation Associated symptoms: no abdominal pain, no back pain, no diarrhea, no dysuria, no fever and no vomiting   pt c/o constipation for the past 3 days.  States normally he has a bm 1-2x/day.  In past 3 days, very small stool has passed, but pt feels he hasnt had a full bm in 3 days. No diarrhea. No abd pain or distension. Normal appetite. No gu c/o. Pt states he has hx hemorrhoids and feels like possibly they are the problem. No rectal bleeding. No wt loss. No fever or chills. No recent change in meds or new meds. States recent health otherwise at baseline.      Past Medical History  Diagnosis Date  . Hypertension   . Hypercholesterolemia   . Diabetes mellitus   . CAD (coronary artery disease)     with stent  . BPH (benign prostatic hyperplasia)   . Adenoma of large intestine    Past Surgical History  Procedure Laterality Date  . Hernia repair      right groin  . Coronary angioplasty with stent placement      RCA  . Colonoscopy  June 2004    Dr. Sharlett Iles: notes hx of adenomatous polyps. no polyps at time of exam, noted pancolonic diverticula.    Family History  Problem Relation Age of Onset  . Colon cancer Neg Hx    History  Substance Use Topics  . Smoking status: Former Research scientist (life sciences)  . Smokeless tobacco: Not on file     Comment: Quit in 1984  . Alcohol Use: No    Review of Systems  Constitutional: Negative for fever and chills.  HENT: Negative for sore throat.   Eyes: Negative for redness.  Respiratory: Negative for shortness of breath.   Cardiovascular: Negative for chest pain.   Gastrointestinal: Positive for constipation. Negative for vomiting, abdominal pain, diarrhea, blood in stool and abdominal distention.  Genitourinary: Negative for dysuria and flank pain.  Musculoskeletal: Negative for back pain and neck pain.  Skin: Negative for rash.  Neurological: Negative for weakness, numbness and headaches.  Hematological: Does not bruise/bleed easily.  Psychiatric/Behavioral: Negative for confusion.      Allergies  Review of patient's allergies indicates no known allergies.  Home Medications   Prior to Admission medications   Medication Sig Start Date End Date Taking? Authorizing Provider  aspirin 81 MG tablet Take 81 mg by mouth daily.    Historical Provider, MD  donepezil (ARICEPT) 10 MG tablet Take 10 mg by mouth at bedtime as needed.    Historical Provider, MD  enalapril (VASOTEC) 10 MG tablet Take 10 mg by mouth daily.    Historical Provider, MD  hydrochlorothiazide (MICROZIDE) 12.5 MG capsule Take 12.5 mg by mouth daily.    Historical Provider, MD  hydrocortisone (ANUSOL-HC) 2.5 % rectal cream Place rectally 2 (two) times daily. For 10 days. 05/30/12   Orvil Feil, NP  lidocaine-hydrocortisone (ANAMANTLE) 3-1 % KIT Place 1 application rectally 2 (two) times daily. For 7 days 05/30/12   Orvil Feil, NP  metFORMIN (GLUCOPHAGE) 500 MG tablet Take 500 mg by mouth 2 (two) times daily with a  meal.    Historical Provider, MD  Multiple Vitamins-Minerals (OCUVITE PRESERVISION PO) Take by mouth daily.    Historical Provider, MD  polyethylene glycol powder (GLYCOLAX/MIRALAX) powder Take 17 g by mouth daily. For a bowel movement 07/02/12   Orvil Feil, NP  pravastatin (PRAVACHOL) 80 MG tablet Take 80 mg by mouth daily.    Historical Provider, MD  Tamsulosin HCl (FLOMAX) 0.4 MG CAPS Take 0.4 mg by mouth daily.    Historical Provider, MD   BP 164/69 mmHg  Pulse 65  Temp(Src) 98.3 F (36.8 C) (Oral)  Resp 17  Ht '5\' 8"'  (1.727 m)  Wt 155 lb (70.308 kg)  BMI 23.57  kg/m2  SpO2 97% Physical Exam  Constitutional: He is oriented to person, place, and time. He appears well-developed and well-nourished. No distress.  HENT:  Mouth/Throat: Oropharynx is clear and moist.  Eyes: Conjunctivae are normal. No scleral icterus.  Neck: Neck supple. No tracheal deviation present.  Cardiovascular: Normal rate.   Pulmonary/Chest: Effort normal. No accessory muscle usage. No respiratory distress.  Abdominal: Soft. Bowel sounds are normal. He exhibits no distension and no mass. There is no tenderness. There is no rebound and no guarding.  Genitourinary:  No cva tenderness.  Rectal w soft brown stool, no impaction. Pt tolerates exam v briefly and v limited - given limited exam, no thrombosed, inflamed or obstructing hemorrhoids noted, no perirectal/rectal mass or abscess felt.   Musculoskeletal: Normal range of motion.  Neurological: He is alert and oriented to person, place, and time.  Skin: Skin is warm and dry.  Psychiatric: He has a normal mood and affect.  Nursing note and vitals reviewed.   ED Course  Procedures (including critical care time) Labs Review     MDM  Reviewed nursing notes and prior charts for additional history.   No large, thrombosed or obstructing hemorrhoids noted on exam. No rectal mass felt, although pt tolerating only v limited exam.  Soft stool, no impaction felt. Dulcolax suppository.  rec stool soft and/or miralax as need for symptom relief.  abd soft nt. No distension. No nv. Normal appetite. No fevers.   Pt currently appears stable for d/c.     Lajean Saver, MD 10/16/14 1426

## 2014-10-16 NOTE — Discharge Instructions (Signed)
It was our pleasure to provide your ER care today - we hope that you feel better.  Drink plenty of fluids. Get adequate fiber in your diet.  You may take colace (stool softener), and use miralax (laxative) as need for constipation - both of these medications as available over the counter, at your local pharmacy.  Follow up with primary care doctor this Monday if symptoms fail to improve/resolve.  Return to ER if worse, new symptoms, fevers, abdominal pain, vomiting, other concern.        Constipation Constipation is when a person has fewer than three bowel movements a week, has difficulty having a bowel movement, or has stools that are dry, hard, or larger than normal. As people grow older, constipation is more common. If you try to fix constipation with medicines that make you have a bowel movement (laxatives), the problem may get worse. Long-term laxative use may cause the muscles of the colon to become weak. A low-fiber diet, not taking in enough fluids, and taking certain medicines may make constipation worse.  CAUSES   Certain medicines, such as antidepressants, pain medicine, iron supplements, antacids, and water pills.   Certain diseases, such as diabetes, irritable bowel syndrome (IBS), thyroid disease, or depression.   Not drinking enough water.   Not eating enough fiber-rich foods.   Stress or travel.   Lack of physical activity or exercise.   Ignoring the urge to have a bowel movement.   Using laxatives too much.  SIGNS AND SYMPTOMS   Having fewer than three bowel movements a week.   Straining to have a bowel movement.   Having stools that are hard, dry, or larger than normal.   Feeling full or bloated.   Pain in the lower abdomen.   Not feeling relief after having a bowel movement.  DIAGNOSIS  Your health care provider will take a medical history and perform a physical exam. Further testing may be done for severe constipation. Some tests may  include:  A barium enema X-ray to examine your rectum, colon, and, sometimes, your small intestine.   A sigmoidoscopy to examine your lower colon.   A colonoscopy to examine your entire colon. TREATMENT  Treatment will depend on the severity of your constipation and what is causing it. Some dietary treatments include drinking more fluids and eating more fiber-rich foods. Lifestyle treatments may include regular exercise. If these diet and lifestyle recommendations do not help, your health care provider may recommend taking over-the-counter laxative medicines to help you have bowel movements. Prescription medicines may be prescribed if over-the-counter medicines do not work.  HOME CARE INSTRUCTIONS   Eat foods that have a lot of fiber, such as fruits, vegetables, whole grains, and beans.  Limit foods high in fat and processed sugars, such as french fries, hamburgers, cookies, candies, and soda.   A fiber supplement may be added to your diet if you cannot get enough fiber from foods.   Drink enough fluids to keep your urine clear or pale yellow.   Exercise regularly or as directed by your health care provider.   Go to the restroom when you have the urge to go. Do not hold it.   Only take over-the-counter or prescription medicines as directed by your health care provider. Do not take other medicines for constipation without talking to your health care provider first.  [place] IF:   You have bright red blood in your stool.   Your constipation lasts for more than  4 days or gets worse.   You have abdominal or rectal pain.   You have thin, pencil-like stools.   You have unexplained weight loss. MAKE SURE YOU:   Understand these instructions.  Will watch your condition.  Will get help right away if you are not doing well or get worse. Document Released: 03/31/2004 Document Revised: 07/08/2013 Document Reviewed: 04/14/2013 [facility] Patient  Information 2015 [place], [place]. This information is not intended to replace advice given to you by your health care provider. Make sure you discuss any questions you have with your health care provider.

## 2014-10-16 NOTE — ED Notes (Signed)
DR.steinl at bedside to explain discharge to pt and daughter. Daughter states she doesn't not feel like pt can go home at this time because he is to uncomfortable to tolerate ride back to [place].

## 2014-10-23 ENCOUNTER — Ambulatory Visit (INDEPENDENT_AMBULATORY_CARE_PROVIDER_SITE_OTHER): Payer: Medicare Other | Admitting: Nurse Practitioner

## 2014-10-23 ENCOUNTER — Encounter: Payer: Self-pay | Admitting: Nurse Practitioner

## 2014-10-23 VITALS — BP 116/54 | HR 80 | Temp 97.6°F | Ht 68.0 in | Wt 145.6 lb

## 2014-10-23 DIAGNOSIS — K649 Unspecified hemorrhoids: Secondary | ICD-10-CM | POA: Diagnosis not present

## 2014-10-23 DIAGNOSIS — K5900 Constipation, unspecified: Secondary | ICD-10-CM

## 2014-10-23 MED ORDER — LIDOCAINE-HYDROCORTISONE ACE 3-0.5 % RE CREA: 1.0000 | TOPICAL_CREAM | Freq: Two times a day (BID) | RECTAL | Status: DC

## 2014-10-23 NOTE — Progress Notes (Signed)
Referring Provider: Asencion Noble, MD Primary Care Physician:  Asencion Noble, MD Primary GI:   Chief Complaint  Patient presents with  . Hemorrhoids    HPI:   81-year-old male presents for evaluation of hemorrhoid symptoms. Previous ER records reviewed. Started having current flare up about a week and a half ago. Was having normal, soft BMs up until them because of his diet. Has recently having some fecal urgency. Last Wednesday, went to the bathroom 14 times because he felt the need to defecate and would sit on the commode and be unable to produce a stool. The day after that he had the same problem 12 times. At this time his hemorrhoids began with worsening pain. Friday he went to the ER who determined he did NOT have thrombosed or obstructing hemorrhoids. They treated him with stool softeners and Miralax. At the same time he received a Hydrocortisone acetate rectal suppositories. All of this has resulted in some relief and since then he has been having a return to his baseline. Having 2-3 BMs a day which are soft and passing easily. Eating a high fiber diet and fiber supplementation per his PCPs direction. Also uses stool softener as needed. Denies hematochezia or melena, chest pain, N/V, abdominal pain.   Past Medical History  Diagnosis Date  . Hypertension   . Hypercholesterolemia   . Diabetes mellitus   . CAD (coronary artery disease)     with stent  . BPH (benign prostatic hyperplasia)   . Adenoma of large intestine     Past Surgical History  Procedure Laterality Date  . Hernia repair      right groin  . Coronary angioplasty with stent placement      RCA  . Colonoscopy  June 2004    Dr. Sharlett Iles: notes hx of adenomatous polyps. no polyps at time of exam, noted pancolonic diverticula.     Current Outpatient Prescriptions  Medication Sig Dispense Refill  . aspirin 81 MG tablet Take 81 mg by mouth daily.    Marland Kitchen donepezil (ARICEPT) 10 MG tablet Take 10 mg by mouth at bedtime as  needed.    . enalapril (VASOTEC) 10 MG tablet Take 10 mg by mouth daily.    . hydrochlorothiazide (MICROZIDE) 12.5 MG capsule Take 12.5 mg by mouth daily.    . hydrocortisone (ANUSOL-HC) 2.5 % rectal cream Place rectally 2 (two) times daily. For 10 days. 30 g 1  . lidocaine-hydrocortisone (ANAMANTLE) 3-1 % KIT Place 1 application rectally 2 (two) times daily. For 7 days 1 each 0  . metFORMIN (GLUCOPHAGE) 500 MG tablet Take 500 mg by mouth 2 (two) times daily with a meal.    . Multiple Vitamins-Minerals (OCUVITE PRESERVISION PO) Take by mouth daily.    . polyethylene glycol powder (GLYCOLAX/MIRALAX) powder Take 17 g by mouth daily. For a bowel movement 255 g 3  . pravastatin (PRAVACHOL) 80 MG tablet Take 80 mg by mouth daily.    . Tamsulosin HCl (FLOMAX) 0.4 MG CAPS Take 0.4 mg by mouth daily.     No current facility-administered medications for this visit.    Allergies as of 10/23/2014  . (No Known Allergies)    Family History  Problem Relation Age of Onset  . Colon cancer Neg Hx     History   Social History  . Marital Status: Widowed    Spouse Name: [name]  . Number of Children: [name]  . Years of Education: [name]   Social History Main Topics  .  Smoking status: Former Research scientist (life sciences)  . Smokeless tobacco: Not on file     Comment: Quit in 1984  . Alcohol Use: No  . Drug Use: No  . Sexual Activity: Not on file   Other Topics Concern  . None   Social History Narrative    Review of Systems: Gen: Denies fever, chills, anorexia. Denies fatigue, weakness, weight loss.  CV: Denies chest pain, palpitations, syncope, peripheral edema, and claudication. Resp: Denies dyspnea at rest, cough, wheezing, coughing up blood, and pleurisy. GI: Denies vomiting blood, jaundice, and fecal incontinence.   Denies dysphagia or odynophagia. Derm: Denies rash, itching, dry skin Psych: Denies depression, anxiety, memory loss, confusion. No homicidal or suicidal ideation.  Heme: Denies bruising, bleeding, and  enlarged lymph nodes.  Physical Exam: BP 116/54 mmHg  Pulse 80  Temp(Src) 97.6 F (36.4 C) (Oral)  Ht '5\' 8"'  (1.727 m)  Wt 145 lb 9.6 oz (66.044 kg)  BMI 22.14 kg/m2 General:   Alert and oriented. No distress noted. Pleasant and cooperative.  Head:  Normocephalic and atraumatic. Eyes:  Conjuctiva clear without scleral icterus. Mouth:  Oral mucosa pink and moist. Good dentition. No lesions. Neck:  Supple, without mass or thyromegaly. Lungs:  Clear to auscultation bilaterally. No wheezes, rales, or rhonchi. No distress.  Heart:  S1, S2 present without murmurs, rubs, or gallops. Regular rate and rhythm. Abdomen:  +BS, soft, non-tender and non-distended. No rebound or guarding. No HSM or masses noted. Msk:  Symmetrical without gross deformities. Normal posture. Pulses:  2+ DP noted bilaterally Extremities:  Without edema. Neurologic:  Alert and  oriented x4;  grossly normal neurologically. Skin:  Intact without significant lesions or rashes. Cervical Nodes:  No significant cervical adenopathy. Psych:  Alert and cooperative. Normal mood and affect.    10/23/2014 11:56 AM

## 2014-10-23 NOTE — Patient Instructions (Signed)
1. I sent in the prescription for the hemorrhoid cream to your pharmacy for you 2. Notify us if you have any worsening or recurrent symptoms 3. We can consider evaluation for banding if you continue to have recurrent symptoms

## 2014-10-23 NOTE — Assessment & Plan Note (Signed)
81 year old male with a history of hemorrhoids who had a flare up of his hemorrhoid symptoms approximately a week and a half ago that was associated with 2 days of straining and constipation. Hydrocortisone suppositories and treatment emergency room for constipation has seemed to improve his condition. Symptoms are improved, but not resolved. The patient is out of suppositories. At least 4-5 of the suppositories were wasted when he initially try to use them and could not get them inserted. Denies any red flag/warning signs including hematochezia, melena, severe abdominal pain, intractable nausea vomiting, and others. At this point we'll can to need to monitor his condition. I sent in hydrocortisone/lidocaine rectal cream to his pharmacy to continue symptomatically treatment. Informed him about the possibility of evaluation for internal hemorrhoid banding should he have recurrent or worsening symptoms. Return to our office as needed.

## 2014-10-26 NOTE — Progress Notes (Signed)
cc'ed to pcp °

## 2014-10-29 ENCOUNTER — Telehealth: Payer: Self-pay | Admitting: Internal Medicine

## 2014-10-29 NOTE — Telephone Encounter (Signed)
noted 

## 2014-10-29 NOTE — Telephone Encounter (Signed)
Pt has OV with RMR on 4/19. Patient was also brought in urgently by RMR request and seen EG on 4/8.  Patient called today asking if he should keep his OV. I told him that EG recommendations were to follow up as needed and if he felt that the medicine was helping he could cancel OV or if he wants to follow up with RMR on 4/19 that would be up to patient what he wants to do. Patient said the medicine was helping, but he didn't feel that he was out of the woods yet and thought he should still be seen, I told him to keep his OV with RMR then and he agreed.

## 2014-11-03 ENCOUNTER — Encounter: Payer: Self-pay | Admitting: Internal Medicine

## 2014-11-03 ENCOUNTER — Ambulatory Visit (INDEPENDENT_AMBULATORY_CARE_PROVIDER_SITE_OTHER): Payer: Medicare Other | Admitting: Internal Medicine

## 2014-11-03 ENCOUNTER — Other Ambulatory Visit: Payer: Self-pay

## 2014-11-03 VITALS — BP 133/56 | HR 66 | Temp 97.6°F | Ht 68.0 in | Wt 148.2 lb

## 2014-11-03 DIAGNOSIS — Z1211 Encounter for screening for malignant neoplasm of colon: Secondary | ICD-10-CM

## 2014-11-03 DIAGNOSIS — Z86010 Personal history of colonic polyps: Secondary | ICD-10-CM

## 2014-11-03 DIAGNOSIS — R194 Change in bowel habit: Secondary | ICD-10-CM

## 2014-11-03 DIAGNOSIS — R198 Other specified symptoms and signs involving the digestive system and abdomen: Secondary | ICD-10-CM

## 2014-11-03 MED ORDER — PEG-KCL-NACL-NASULF-NA ASC-C 100 G PO SOLR: 1.0000 | Freq: Once | ORAL | Status: DC

## 2014-11-03 MED ORDER — PEG 3350-KCL-NA BICARB-NACL 420 G PO SOLR: 4000.0000 mL | Freq: Once | ORAL | Status: DC

## 2014-11-03 NOTE — Patient Instructions (Addendum)
Schedule diagnostic colonoscopy - change in bowel habits  Split Movie prep  May add Miralax 17gm (1 capful) with 8oz of water at bedtime  Do not take the evening dose of Glucophage the night before the procedure.  Further recommendations to follow

## 2014-11-03 NOTE — Progress Notes (Signed)
Primary Care Physician:  Asencion Noble, MD Primary Gastroenterologist:  Dr. Gala Romney  Pre-Procedure History & Physical: HPI:  Jordan [name] is a 81 y.o. male here for followup of bone change in bowel complaints and rectal pain. Patient has had insidious development of change in bowel habits over the past month to 6 weeks. Feels rectal urgency and difficulty passing the bowel movements. Also having rectal pain. Has not seen any blood. Was seen in the ED and felt not to have any thrombosed hemorrhoids. We saw him on 10/23/2014. Which resume hydrocortisone says lidocaine rectal Green this alleviated some of his anorectal pain but has not improved his recent deprecatory symptoms. At the same time having difficulty with evacuating he states he also has difficulties with urgency. Does not feel that he has produced an  adequate bowel movement in at least a month and a half. No associated abdominal pain. Distant history of colonic adenomas  - removed in [facility]. Last colonoscopy was 12 years ago (Dr. Sharlett Iles). Diverticulosis.  CAT scan here 3 years ago demonstrated an enlarged prostate and diverticulosis. Weight actually up 3 pounds since his last visit.  No change in medications recently otherwise. He does take Aricept. He is familiar with MiraLax but states he has never taken it.  Past Medical History  Diagnosis Date  . Hypertension   . Hypercholesterolemia   . Diabetes mellitus   . CAD (coronary artery disease)     with stent  . BPH (benign prostatic hyperplasia)   . Adenoma of large intestine     Past Surgical History  Procedure Laterality Date  . Hernia repair      right groin  . Coronary angioplasty with stent placement      RCA  . Colonoscopy  June 2004    Dr. Sharlett Iles: notes hx of adenomatous polyps. no polyps at time of exam, noted pancolonic diverticula.     Prior to Admission medications   Medication Sig Start Date End Date Taking? Authorizing Provider    aspirin 81 MG tablet Take 81 mg by mouth daily.   Yes Historical Provider, MD  donepezil (ARICEPT) 10 MG tablet Take 10 mg by mouth at bedtime as needed.   Yes Historical Provider, MD  enalapril (VASOTEC) 10 MG tablet Take 10 mg by mouth daily.   Yes Historical Provider, MD  hydrochlorothiazide (MICROZIDE) 12.5 MG capsule Take 12.5 mg by mouth daily.   Yes Historical Provider, MD  hydrocortisone (ANUSOL-HC) 2.5 % rectal cream Place rectally 2 (two) times daily. For 10 days. 05/30/12  Yes Orvil Feil, NP  lidocaine-hydrocortisone (ANAMANTLE) 3-1 % KIT Place 1 application rectally 2 (two) times daily. For 7 days 05/30/12  Yes Orvil Feil, NP  metFORMIN (GLUCOPHAGE) 500 MG tablet Take 500 mg by mouth 2 (two) times daily with a meal.   Yes Historical Provider, MD  Multiple Vitamins-Minerals (OCUVITE PRESERVISION PO) Take by mouth daily.   Yes Historical Provider, MD  polyethylene glycol powder (GLYCOLAX/MIRALAX) powder Take 17 g by mouth daily. For a bowel movement 07/02/12  Yes Orvil Feil, NP  pravastatin (PRAVACHOL) 80 MG tablet Take 80 mg by mouth daily.   Yes Historical Provider, MD  Tamsulosin HCl (FLOMAX) 0.4 MG CAPS Take 0.4 mg by mouth daily.   Yes Historical Provider, MD    Allergies as of 11/03/2014  . (No Known Allergies)    Family History  Problem Relation Age of Onset  . Colon cancer Neg Hx  History   Social History  . Marital Status: Widowed    Spouse Name: [name]  . Number of Children: [name]  . Years of Education: [name]   Occupational History  . Not on file.   Social History Main Topics  . Smoking status: Former Research scientist (life sciences)  . Smokeless tobacco: Not on file     Comment: Quit in 1984  . Alcohol Use: No  . Drug Use: No  . Sexual Activity: Not on file   Other Topics Concern  . Not on file   Social History Narrative    Review of Systems: See HPI, otherwise negative ROS  Physical Exam: BP 133/56 mmHg  Pulse 66  Temp(Src) 97.6 F (36.4 C) (Oral)  Ht '5\' 8"'  (1.727  m)  Wt 148 lb 3.2 oz (67.223 kg)  BMI 22.54 kg/m2 General:   Alert,  81 year old gentleman, pleasant and cooperative in NAD; well oriented. Skin:  Intact without significant lesions or rashes. Eyes:  Sclera clear, no icterus.   Conjunctiva pink. Ears:  Normal auditory acuity. Nose:  No deformity, discharge,  or lesions. Mouth:  No deformity or lesions. Neck:  Supple; no masses or thyromegaly. No significant cervical adenopathy. Lungs:  Clear throughout to auscultation.   No wheezes, crackles, or rhonchi. No acute distress. Heart:  Regular rate and rhythm; no murmurs, clicks, rubs,  or gallops. Abdomen: Non-distended, normal bowel sounds.  Soft and nontender without appreciable mass or hepatosplenomegaly.  Pulses:  Normal pulses noted. Extremities:  Without clubbing or edema. Rectal exam: No external lesions seen. Moderately good sphincter tone. He has a good bit of brown pasty stool in his rectal vault. I do not appreciate a mass. No impaction.Prostate seems to be generous without nodularity. Stool is Hemoccult negative.   Impression:  Pleasant 81 year old gentleman with a 6 week or so history of altered bowel function as outlined above. He does not have any external anal lesions. A generous aount of soft stool the rectal vault noted. Rectal exam is nontender today. I feel it's difficult to explain all his bowel symptoms on the basis of hemorrhoids. Distant history of colonic adenomas. He is in overall pretty good shape to be 81 years old. I feel that his lower GI tract needs to be investigated further.   Recommendations:   Further evaluation of his lower GI tract warranted. I discussed barium contrast study and/or sigmoidoscopy versus virtual colonoscopy versus optical colonoscopy.  I certainly would not a surveillance colonoscopy simply for a distant history of colonic adenomas in this 81 year old gentleman. However, new-onset lower GI tract symptoms in this scenario warrants further  evaluation. He is in generally good health for a 81 year old. For this reason, I have recommended we go directly to a diagnostic colonoscopy. The risks, benefits, limitations, alternatives and imponderables have been reviewed with the patient. Questions have been answered. All parties are agreeable.   Schedule diagnostic colonoscopy - change in bowel habits  Split Movie prep  May add Miralax 17gm (1 capful) with 8oz of water at bedtime to the current regimen in the interim.  Do not take the evening dose of Glucophage the night before the procedure.  Further recommendations to follow     Notice: This dictation was prepared with Dragon dictation along with smaller phrase technology. Any transcriptional errors that result from this process are unintentional and may not be corrected upon review.

## 2014-11-16 ENCOUNTER — Ambulatory Visit (HOSPITAL_COMMUNITY)
Admission: RE | Admit: 2014-11-16 | Discharge: 2014-11-16 | Disposition: A | Discharge status: Home / Self Care | Payer: Medicare Other | Source: Ambulatory Visit | Attending: Internal Medicine | Admitting: Internal Medicine

## 2014-11-16 ENCOUNTER — Encounter (HOSPITAL_COMMUNITY)
Admission: RE | Disposition: A | Discharge status: Home / Self Care | Payer: Self-pay | Source: Ambulatory Visit | Attending: Internal Medicine

## 2014-11-16 ENCOUNTER — Encounter (HOSPITAL_COMMUNITY): Payer: Self-pay | Admitting: *Deleted

## 2014-11-16 DIAGNOSIS — K5730 Diverticulosis of large intestine without perforation or abscess without bleeding: Secondary | ICD-10-CM | POA: Diagnosis not present

## 2014-11-16 DIAGNOSIS — Z7982 Long term (current) use of aspirin: Secondary | ICD-10-CM | POA: Diagnosis not present

## 2014-11-16 DIAGNOSIS — Z86010 Personal history of colonic polyps: Secondary | ICD-10-CM | POA: Insufficient documentation

## 2014-11-16 DIAGNOSIS — D120 Benign neoplasm of cecum: Secondary | ICD-10-CM | POA: Diagnosis not present

## 2014-11-16 DIAGNOSIS — K649 Unspecified hemorrhoids: Secondary | ICD-10-CM | POA: Insufficient documentation

## 2014-11-16 DIAGNOSIS — I2510 Atherosclerotic heart disease of native coronary artery without angina pectoris: Secondary | ICD-10-CM | POA: Diagnosis not present

## 2014-11-16 DIAGNOSIS — D122 Benign neoplasm of ascending colon: Secondary | ICD-10-CM | POA: Diagnosis not present

## 2014-11-16 DIAGNOSIS — R194 Change in bowel habit: Secondary | ICD-10-CM | POA: Diagnosis not present

## 2014-11-16 DIAGNOSIS — Z955 Presence of coronary angioplasty implant and graft: Secondary | ICD-10-CM | POA: Diagnosis not present

## 2014-11-16 DIAGNOSIS — Z87891 Personal history of nicotine dependence: Secondary | ICD-10-CM | POA: Diagnosis not present

## 2014-11-16 DIAGNOSIS — E780 Pure hypercholesterolemia: Secondary | ICD-10-CM | POA: Diagnosis not present

## 2014-11-16 DIAGNOSIS — N400 Enlarged prostate without lower urinary tract symptoms: Secondary | ICD-10-CM | POA: Insufficient documentation

## 2014-11-16 DIAGNOSIS — I10 Essential (primary) hypertension: Secondary | ICD-10-CM | POA: Insufficient documentation

## 2014-11-16 DIAGNOSIS — E119 Type 2 diabetes mellitus without complications: Secondary | ICD-10-CM | POA: Insufficient documentation

## 2014-11-16 LAB — GLUCOSE, CAPILLARY: GLUCOSE-CAPILLARY: 91 mg/dL (ref 70–99)

## 2014-11-16 SURGERY — COLONOSCOPY
Anesthesia: Moderate Sedation

## 2014-11-16 MED ORDER — ONDANSETRON HCL 4 MG/2ML IJ SOLN
INTRAMUSCULAR | Status: AC
  Filled 2014-11-16: qty 2

## 2014-11-16 MED ORDER — SODIUM CHLORIDE 0.9 % IJ SOLN
PREFILLED_SYRINGE | INTRAMUSCULAR | Status: DC | PRN
  Administered 2014-11-16: 20 mL

## 2014-11-16 MED ORDER — EPINEPHRINE HCL 0.1 MG/ML IJ SOSY
PREFILLED_SYRINGE | INTRAMUSCULAR | Status: AC
  Filled 2014-11-16: qty 10

## 2014-11-16 MED ORDER — MIDAZOLAM HCL 5 MG/5ML IJ SOLN
INTRAMUSCULAR | Status: AC
  Filled 2014-11-16: qty 10

## 2014-11-16 MED ORDER — STERILE WATER FOR IRRIGATION IR SOLN
Status: DC | PRN
  Administered 2014-11-16: 14:00:00

## 2014-11-16 MED ORDER — MEPERIDINE HCL 100 MG/ML IJ SOLN
INTRAMUSCULAR | Status: DC | PRN
  Administered 2014-11-16: 25 mg via INTRAVENOUS

## 2014-11-16 MED ORDER — MEPERIDINE HCL 100 MG/ML IJ SOLN
INTRAMUSCULAR | Status: AC
  Filled 2014-11-16: qty 2

## 2014-11-16 MED ORDER — SODIUM CHLORIDE 0.9 % IV SOLN
INTRAVENOUS | Status: DC
  Administered 2014-11-16: 13:00:00 via INTRAVENOUS

## 2014-11-16 MED ORDER — ONDANSETRON HCL 4 MG/2ML IJ SOLN
INTRAMUSCULAR | Status: DC | PRN
  Administered 2014-11-16: 4 mg via INTRAVENOUS

## 2014-11-16 MED ORDER — MIDAZOLAM HCL 5 MG/5ML IJ SOLN
INTRAMUSCULAR | Status: DC | PRN
Start: 2014-11-16 — End: 2014-11-16
  Administered 2014-11-16 (×3): 1 mg via INTRAVENOUS

## 2014-11-16 NOTE — Discharge Instructions (Signed)
Colonoscopy Discharge Instructions  Read the instructions outlined below and refer to this sheet in the next few weeks. These discharge instructions provide you with general information on caring for yourself after you leave the hospital. Your doctor may also give you specific instructions. While your treatment has been planned according to the most current medical practices available, unavoidable complications occasionally occur. If you have any problems or questions after discharge, call Dr. Gala Romney at [phone]. ACTIVITY  You may resume your regular activity, but move at a slower pace for the next 24 hours.   Take frequent rest periods for the next 24 hours.   Walking will help get rid of the air and reduce the bloated feeling in your belly (abdomen).   No driving for 24 hours (because of the medicine (anesthesia) used during the test).    Do not sign any important legal documents or operate any machinery for 24 hours (because of the anesthesia used during the test).  NUTRITION  Drink plenty of fluids.   You may resume your normal diet as instructed by your doctor.   Begin with a light meal and progress to your normal diet. Heavy or fried foods are harder to digest and may make you feel sick to your stomach (nauseated).   Avoid alcoholic beverages for 24 hours or as instructed.  MEDICATIONS  You may resume your normal medications unless your doctor tells you otherwise.  WHAT YOU CAN EXPECT TODAY  Some feelings of bloating in the abdomen.   Passage of more gas than usual.   Spotting of blood in your stool or on the toilet paper.  IF YOU HAD POLYPS REMOVED DURING THE COLONOSCOPY:  No aspirin products for 7 days or as instructed.   No alcohol for 7 days or as instructed.   Eat a soft diet for the next 24 hours.  FINDING OUT THE RESULTS OF YOUR TEST Not all test results are available during your visit. If your test results are not back during the visit, make an appointment  with your caregiver to find out the results. Do not assume everything is normal if you have not heard from your caregiver or the medical facility. It is important for you to follow up on all of your test results.  SEEK IMMEDIATE MEDICAL ATTENTION IF:  You have more than a spotting of blood in your stool.   Your belly is swollen (abdominal distention).   You are nauseated or vomiting.   You have a temperature over 101.   You have abdominal pain or discomfort that is severe or gets worse throughout the day.      Hemorrhoid, diverticulosis and constipation information provided  Continue fiber supplementation daily. Continue MiraLAX daily as needed  No aspirin or arthritis medication such as Aleve or Advil for 10 days  Further recommendations to follow pending review of pathology report  Office visit with [place] in 4-6 weeks   Diverticulosis Diverticulosis is the condition that develops when small pouches (diverticula) form in the wall of your colon. Your colon, or large intestine, is where water is absorbed and stool is formed. The pouches form when the inside layer of your colon pushes through weak spots in the outer layers of your colon. CAUSES  No one knows exactly what causes diverticulosis. RISK FACTORS  Being older than 62. Your risk for this condition increases with age. Diverticulosis is rare in people younger than 40 years. By age 35, almost everyone has it.  Eating a low-fiber diet.  Being frequently constipated.  Being overweight.  Not getting enough exercise.  Smoking.  Taking over-the-counter pain medicines, like aspirin and ibuprofen. SYMPTOMS  Most people with diverticulosis do not have symptoms. DIAGNOSIS  Because diverticulosis often has no symptoms, health care providers often discover the condition during an exam for other colon problems. In many cases, a health care provider will diagnose diverticulosis while using a flexible scope to examine the colon  (colonoscopy). TREATMENT  If you have never developed an infection related to diverticulosis, you may not need treatment. If you have had an infection before, treatment may include:  Eating more fruits, vegetables, and grains.  Taking a fiber supplement.  Taking a live bacteria supplement (probiotic).  Taking medicine to relax your colon. HOME CARE INSTRUCTIONS   Drink at least 6-8 glasses of water each day to prevent constipation.  Try not to strain when you have a bowel movement.  Keep all follow-up appointments. If you have had an infection before:  Increase the fiber in your diet as directed by your health care provider or dietitian.  Take a dietary fiber supplement if your health care provider approves.  Only take medicines as directed by your health care provider. SEEK MEDICAL CARE IF:   You have abdominal pain.  You have bloating.  You have cramps.  You have not gone to the bathroom in 3 days. SEEK IMMEDIATE MEDICAL CARE IF:   Your pain gets worse.  Yourbloating becomes very bad.  You have a fever or chills, and your symptoms suddenly get worse.  You begin vomiting.  You have bowel movements that are bloody or black. MAKE SURE YOU:  Understand these instructions.  Will watch your condition.  Will get help right away if you are not doing well or get worse. Document Released: 03/30/2004 Document Revised: 07/08/2013 Document Reviewed: 05/28/2013 [facility] Patient Information 2015 [place], [place]. This information is not intended to replace advice given to you by your health care provider. Make sure you discuss any questions you have with your health care provider.  Hemorrhoids Hemorrhoids are swollen veins around the rectum or anus. There are two types of hemorrhoids:   Internal hemorrhoids. These occur in the veins just inside the rectum. They may poke through to the outside and become irritated and painful.  External hemorrhoids. These occur in the  veins outside the anus and can be felt as a painful swelling or hard lump near the anus. CAUSES  Pregnancy.   Obesity.   Constipation or diarrhea.   Straining to have a bowel movement.   Sitting for long periods on the toilet.  Heavy lifting or other activity that caused you to strain.  Anal intercourse. SYMPTOMS   Pain.   Anal itching or irritation.   Rectal bleeding.   Fecal leakage.   Anal swelling.   One or more lumps around the anus.  DIAGNOSIS  Your caregiver may be able to diagnose hemorrhoids by visual examination. Other examinations or tests that may be performed include:   Examination of the rectal area with a gloved hand (digital rectal exam).   Examination of anal canal using a small tube (scope).   A blood test if you have lost a significant amount of blood.  A test to look inside the colon (sigmoidoscopy or colonoscopy). TREATMENT Most hemorrhoids can be treated at home. However, if symptoms do not seem to be getting better or if you have a lot of rectal bleeding, your caregiver may perform a procedure to help make  the hemorrhoids get smaller or remove them completely. Possible treatments include:   Placing a rubber band at the base of the hemorrhoid to cut off the circulation (rubber band ligation).   Injecting a chemical to shrink the hemorrhoid (sclerotherapy).   Using a tool to burn the hemorrhoid (infrared light therapy).   Surgically removing the hemorrhoid (hemorrhoidectomy).   Stapling the hemorrhoid to block blood flow to the tissue (hemorrhoid stapling).  HOME CARE INSTRUCTIONS   Eat foods with fiber, such as whole grains, beans, nuts, fruits, and vegetables. Ask your doctor about taking products with added fiber in them (fibersupplements).  Increase fluid intake. Drink enough water and fluids to keep your urine clear or pale yellow.   Exercise regularly.   Go to the bathroom when you have the urge to have a bowel  movement. Do not wait.   Avoid straining to have bowel movements.   Keep the anal area dry and clean. Use wet toilet paper or moist towelettes after a bowel movement.   Medicated creams and suppositories may be used or applied as directed.   Only take over-the-counter or prescription medicines as directed by your caregiver.   Take warm sitz baths for 15-20 minutes, 3-4 times a day to ease pain and discomfort.   Place ice packs on the hemorrhoids if they are tender and swollen. Using ice packs between sitz baths may be helpful.   Put ice in a plastic bag.   Place a towel between your skin and the bag.   Leave the ice on for 15-20 minutes, 3-4 times a day.   Do not use a donut-shaped pillow or sit on the toilet for long periods. This increases blood pooling and pain.  SEEK MEDICAL CARE IF:  You have increasing pain and swelling that is not controlled by treatment or medicine.  You have uncontrolled bleeding.  You have difficulty or you are unable to have a bowel movement.  You have pain or inflammation outside the area of the hemorrhoids. MAKE SURE YOU:  Understand these instructions.  Will watch your condition.  Will get help right away if you are not doing well or get worse. Document Released: 06/30/2000 Document Revised: 06/19/2012 Document Reviewed: 05/07/2012 [facility] Patient Information 2015 [place], [place]. This information is not intended to replace advice given to you by your health care provider. Make sure you discuss any questions you have with your health care provider.   Diverticulosis Diverticulosis is the condition that develops when small pouches (diverticula) form in the wall of your colon. Your colon, or large intestine, is where water is absorbed and stool is formed. The pouches form when the inside layer of your colon pushes through weak spots in the outer layers of your colon. CAUSES  No one knows exactly what causes diverticulosis. RISK  FACTORS  Being older than 60. Your risk for this condition increases with age. Diverticulosis is rare in people younger than 40 years. By age 20, almost everyone has it.  Eating a low-fiber diet.  Being frequently constipated.  Being overweight.  Not getting enough exercise.  Smoking.  Taking over-the-counter pain medicines, like aspirin and ibuprofen. SYMPTOMS  Most people with diverticulosis do not have symptoms. DIAGNOSIS  Because diverticulosis often has no symptoms, health care providers often discover the condition during an exam for other colon problems. In many cases, a health care provider will diagnose diverticulosis while using a flexible scope to examine the colon (colonoscopy). TREATMENT  If you have never developed an  infection related to diverticulosis, you may not need treatment. If you have had an infection before, treatment may include:  Eating more fruits, vegetables, and grains.  Taking a fiber supplement.  Taking a live bacteria supplement (probiotic).  Taking medicine to relax your colon. HOME CARE INSTRUCTIONS   Drink at least 6-8 glasses of water each day to prevent constipation.  Try not to strain when you have a bowel movement.  Keep all follow-up appointments. If you have had an infection before:  Increase the fiber in your diet as directed by your health care provider or dietitian.  Take a dietary fiber supplement if your health care provider approves.  Only take medicines as directed by your health care provider. SEEK MEDICAL CARE IF:   You have abdominal pain.  You have bloating.  You have cramps.  You have not gone to the bathroom in 3 days. SEEK IMMEDIATE MEDICAL CARE IF:   Your pain gets worse.  Yourbloating becomes very bad.  You have a fever or chills, and your symptoms suddenly get worse.  You begin vomiting.  You have bowel movements that are bloody or black. MAKE SURE YOU:  Understand these instructions.  Will  watch your condition.  Will get help right away if you are not doing well or get worse. Document Released: 03/30/2004 Document Revised: 07/08/2013 Document Reviewed: 05/28/2013 [facility] Patient Information 2015 [place], [place]. This information is not intended to replace advice given to you by your health care provider. Make sure you discuss any questions you have with your health care provider.

## 2014-11-16 NOTE — H&P (View-Only) (Signed)
Referring Provider: Asencion Noble, MD Primary Care Physician:  Asencion Noble, MD Primary GI:   Chief Complaint  Patient presents with  . Hemorrhoids    HPI:   81-year-old male presents for evaluation of hemorrhoid symptoms. Previous ER records reviewed. Started having current flare up about a week and a half ago. Was having normal, soft BMs up until them because of his diet. Has recently having some fecal urgency. Last Wednesday, went to the bathroom 14 times because he felt the need to defecate and would sit on the commode and be unable to produce a stool. The day after that he had the same problem 12 times. At this time his hemorrhoids began with worsening pain. Friday he went to the ER who determined he did NOT have thrombosed or obstructing hemorrhoids. They treated him with stool softeners and Miralax. At the same time he received a Hydrocortisone acetate rectal suppositories. All of this has resulted in some relief and since then he has been having a return to his baseline. Having 2-3 BMs a day which are soft and passing easily. Eating a high fiber diet and fiber supplementation per his PCPs direction. Also uses stool softener as needed. Denies hematochezia or melena, chest pain, N/V, abdominal pain.   Past Medical History  Diagnosis Date  . Hypertension   . Hypercholesterolemia   . Diabetes mellitus   . CAD (coronary artery disease)     with stent  . BPH (benign prostatic hyperplasia)   . Adenoma of large intestine     Past Surgical History  Procedure Laterality Date  . Hernia repair      right groin  . Coronary angioplasty with stent placement      RCA  . Colonoscopy  June 2004    Dr. Sharlett Iles: notes hx of adenomatous polyps. no polyps at time of exam, noted pancolonic diverticula.     Current Outpatient Prescriptions  Medication Sig Dispense Refill  . aspirin 81 MG tablet Take 81 mg by mouth daily.    Marland Kitchen donepezil (ARICEPT) 10 MG tablet Take 10 mg by mouth at bedtime as  needed.    . enalapril (VASOTEC) 10 MG tablet Take 10 mg by mouth daily.    . hydrochlorothiazide (MICROZIDE) 12.5 MG capsule Take 12.5 mg by mouth daily.    . hydrocortisone (ANUSOL-HC) 2.5 % rectal cream Place rectally 2 (two) times daily. For 10 days. 30 g 1  . lidocaine-hydrocortisone (ANAMANTLE) 3-1 % KIT Place 1 application rectally 2 (two) times daily. For 7 days 1 each 0  . metFORMIN (GLUCOPHAGE) 500 MG tablet Take 500 mg by mouth 2 (two) times daily with a meal.    . Multiple Vitamins-Minerals (OCUVITE PRESERVISION PO) Take by mouth daily.    . polyethylene glycol powder (GLYCOLAX/MIRALAX) powder Take 17 g by mouth daily. For a bowel movement 255 g 3  . pravastatin (PRAVACHOL) 80 MG tablet Take 80 mg by mouth daily.    . Tamsulosin HCl (FLOMAX) 0.4 MG CAPS Take 0.4 mg by mouth daily.     No current facility-administered medications for this visit.    Allergies as of 10/23/2014  . (No Known Allergies)    Family History  Problem Relation Age of Onset  . Colon cancer Neg Hx     History   Social History  . Marital Status: Widowed    Spouse Name: [name]  . Number of Children: [name]  . Years of Education: [name]   Social History Main Topics  .  Smoking status: Former Research scientist (life sciences)  . Smokeless tobacco: Not on file     Comment: Quit in 1984  . Alcohol Use: No  . Drug Use: No  . Sexual Activity: Not on file   Other Topics Concern  . None   Social History Narrative    Review of Systems: Gen: Denies fever, chills, anorexia. Denies fatigue, weakness, weight loss.  CV: Denies chest pain, palpitations, syncope, peripheral edema, and claudication. Resp: Denies dyspnea at rest, cough, wheezing, coughing up blood, and pleurisy. GI: Denies vomiting blood, jaundice, and fecal incontinence.   Denies dysphagia or odynophagia. Derm: Denies rash, itching, dry skin Psych: Denies depression, anxiety, memory loss, confusion. No homicidal or suicidal ideation.  Heme: Denies bruising, bleeding, and  enlarged lymph nodes.  Physical Exam: BP 116/54 mmHg  Pulse 80  Temp(Src) 97.6 F (36.4 C) (Oral)  Ht '5\' 8"'  (1.727 m)  Wt 145 lb 9.6 oz (66.044 kg)  BMI 22.14 kg/m2 General:   Alert and oriented. No distress noted. Pleasant and cooperative.  Head:  Normocephalic and atraumatic. Eyes:  Conjuctiva clear without scleral icterus. Mouth:  Oral mucosa pink and moist. Good dentition. No lesions. Neck:  Supple, without mass or thyromegaly. Lungs:  Clear to auscultation bilaterally. No wheezes, rales, or rhonchi. No distress.  Heart:  S1, S2 present without murmurs, rubs, or gallops. Regular rate and rhythm. Abdomen:  +BS, soft, non-tender and non-distended. No rebound or guarding. No HSM or masses noted. Msk:  Symmetrical without gross deformities. Normal posture. Pulses:  2+ DP noted bilaterally Extremities:  Without edema. Neurologic:  Alert and  oriented x4;  grossly normal neurologically. Skin:  Intact without significant lesions or rashes. Cervical Nodes:  No significant cervical adenopathy. Psych:  Alert and cooperative. Normal mood and affect.    10/23/2014 11:56 AM

## 2014-11-16 NOTE — Op Note (Signed)
[facility] [address] [place], [address]   COLONOSCOPY PROCEDURE REPORT  PATIENT: Jordan, [name]  MR#: [id number] BIRTHDATE: [birth year] , 89  yrs. old GENDER: male ENDOSCOPIST: R.  Garfield Cornea, MD FACP [facility] REFERRED BY:Roy Willey Blade, M.D. PROCEDURE DATE:  12/11/14 PROCEDURE:   Ileo-Colonoscopy with snare polypectomy and with biopsy  INDICATIONS:  Recent abrupt change in bowel function/obstipation.?"Now much improved with continued fiber supplement daily and MiraLAX; distant history of colonic adenoma. MEDICATIONS: Versed 4 mg IV and Demerol 25 mg IV in divided doses. Zofran 4 mg IV. ASA CLASS:       Class III  CONSENT: The risks, benefits, alternatives and imponderables including but not limited to bleeding, perforation as well as the possibility of a missed lesion have been reviewed.  The potential for biopsy, lesion removal, etc. have also been discussed. Questions have been answered.  All parties agreeable.  Please see the history and physical in the medical record for more information.  DESCRIPTION OF PROCEDURE:   After the risks benefits and alternatives of the procedure were thoroughly explained, informed consent was obtained.  The digital rectal exam      The EC-[id number] ([id number])  endoscope was introduced through the anus and advanced to the terminal ileum which was intubated for a short distance. No adverse events experienced.   The quality of the prep was adequate The instrument was then slowly withdrawn as the colon was fully examined.      COLON FINDINGS: Rectal mucosa somewhat challenging to see.  Rectal vault would not hold CO2 very well.  Rectal vault too small to adequately/safely retroflex.  Rectal mucosa seen well on-face - appeared normal.  Anal canal hemorrhoids.  Pancolonic diverticulosis.  In the cecum, the patient had an 8 mm flat polyp and a 1.5 cm flat sausage?"shaped polyp on a fold just distal to the  appendiceal orifice.  There was a third 3 mm polyp present as well.  The remainder of the colonic mucosa appeared normal.  8 mm polyp was engaged with the snare I had intended to remove it via hot snare technique however we had power failure and it was guillotined.  There was minimal bleeding.  The polyp was recovered. The largest polyp was lifted away from the colonic wall with 5 mL of 1-10,000 epinephrine diluted 50-50 with sterile saline injected through the sclero- needle.  This worked nicely.  Subsequent, the polyp was removed in piecemeal fashion.  Polyp was felt to have been completely removed.  Multiple specimens for the pathologist. The smallest polyp was cold biopsied/removed.  Retroflexion was not performed. .  Withdrawal time=46 minutes 0 seconds.  The scope was withdrawn and the procedure completed. COMPLICATIONS: There were no immediate complications.  ENDOSCOPIC IMPRESSION: Anal canal hemorrhoids. Pancolonic diverticulosis. Multiple cecal polyps removed as described above. Patient clinically, much improved on daily fiber and MiraLAX.  RECOMMENDATIONS: We'll continue daily fiber supplementation as well as MiraLAX daily as needed. Follow-up on pathology. Further recommendations to follow.  eSigned:  R. Garfield Cornea, MD Rosalita Chessman [facility] 11-Dec-2014 2:55 PM   cc:  CPT CODES: ICD CODES:  The ICD and CPT codes recommended by this software are interpretations from the data that the clinical staff has captured with the software.  The verification of the translation of this report to the ICD and CPT codes and modifiers is the sole responsibility of the health care institution and practicing physician where this report was generated.  [place]. will not be  held responsible for the validity of the ICD and CPT codes included on this report.  AMA assumes no liability for data contained or not contained herein. CPT is a Designer, television/film set of the [employer].  PATIENT NAME:  Jordan, [name] MR#: [id number]

## 2014-11-16 NOTE — Interval H&P Note (Signed)
History and Physical Interval Note:  11/16/2014 1:24 PM  Jordan [name]  has presented today for surgery, with the diagnosis of change in bowel habits  The various methods of treatment have been discussed with the patient and family. After consideration of risks, benefits and other options for treatment, the patient has consented to  Procedure(s) with comments: COLONOSCOPY ([name]) - 100pm as a surgical intervention .  The patient's history has been reviewed, patient examined, no change in status, stable for surgery.  I have reviewed the patient's chart and labs.  Questions were answered to the patient's satisfaction.     Jordan [name]  No change. Colonoscopy per plan.  The risks, benefits, limitations, alternatives and imponderables have been reviewed with the patient. Questions have been answered. All parties are agreeable.

## 2014-11-19 ENCOUNTER — Encounter: Payer: Self-pay | Admitting: Internal Medicine

## 2014-12-21 ENCOUNTER — Ambulatory Visit (INDEPENDENT_AMBULATORY_CARE_PROVIDER_SITE_OTHER): Payer: Medicare Other | Admitting: Gastroenterology

## 2014-12-21 ENCOUNTER — Encounter: Payer: Self-pay | Admitting: Gastroenterology

## 2014-12-21 DIAGNOSIS — K5900 Constipation, unspecified: Secondary | ICD-10-CM

## 2014-12-21 DIAGNOSIS — K648 Other hemorrhoids: Secondary | ICD-10-CM

## 2014-12-21 MED ORDER — LIDOCAINE-HYDROCORTISONE ACE 3-1 % RE KIT: 1.0000 "application " | PACK | Freq: Two times a day (BID) | RECTAL | Status: DC

## 2014-12-21 NOTE — Patient Instructions (Signed)
1. Apply prescription hemorrhoid cream from [facility] anal rectally twice daily for the next 14 days. 2. Please call in 10 days if you do not notice improvement or sooner if needed.  3. Continue MiraLAX as before. 4. Please follow-up with Dr. Willey Blade as discussed. If you lose 4-5 more pounds, further evaluation needs to be considered. We can initiate or you can see Dr. Willey Blade.

## 2014-12-21 NOTE — Progress Notes (Signed)
Primary Care Physician: Asencion Noble, MD  Primary Gastroenterologist:  Garfield Cornea, MD   Chief Complaint  Patient presents with  . Follow-up  . feels like he can't go to the restroom thinks hemorrhoids    HPI: Jordan [name] is a 81 y.o. male here for follow-up. History of altered bowel function, difficulty with fecal evacuation for couple months. Recent ileocolonoscopy in May 2016 for these reasons showed anal canal hemorrhoids, pancolonic diverticulosis. 8 mm flat polyp in the cecum as well as a 1.5 cm flat polyp just distal to the appendiceal orifice. Third millimeter polyp present as well. All polyps were tubular adenomas.  Historically patient complains of having difficulty controlling his bowels even when he is having difficulty passing stools. He states he begins any sort of hurt she has to get to the bathroom past or he may have incontinence. Doesn't matter if the stool is soft to formed. When patient was seen last in the office by Dr. Gala Romney, he was given MiraLAX at bedtime. He noted improvement in his bowel function was actually doing well up until Saturday. Saturday he states he started feeling the urge to go and couldn't pass a good stool. He would go several times in past only very small amount of soft, pasty stool. No blood in the stool or melena. Discomfort associated with fullness in the rectum. Yesterday he went to the bathroom 9 times due to the urge to have a bowel movement and passed slowly small amount of pasty stool. He bought an enema but did not use it yet. And he used to eat high fiber diet, take fiber supplements daily. He's taking MiraLAX every night at bedtime. He feels like his hemorrhoids are swollen.   Previously felt AnaMantle, [employer] formulation, helped better than over-the-counter agents. He will like another prescription.  Denies any abdominal pain. Appetite is good. No heartburn. His weight is down about 10 pounds in the past 2 months. He  waited 155 pounds in April 1, 148 pounds on April 19. 145 pounds today. States his sugars have been doing good. He believes he is eating just is usually does. Not quite as active at the fitness center, used to go several times a week, now just a couple of times. No other symptoms. Plans to discuss with Dr. Willey Blade at his upcoming appointment.    Current Outpatient Prescriptions  Medication Sig Dispense Refill  . aspirin 81 MG tablet Take 81 mg by mouth daily.    Marland Kitchen donepezil (ARICEPT) 10 MG tablet Take 10 mg by mouth at bedtime as needed.    . enalapril (VASOTEC) 10 MG tablet Take 10 mg by mouth daily.    . hydrochlorothiazide (MICROZIDE) 12.5 MG capsule Take 12.5 mg by mouth daily.    . metFORMIN (GLUCOPHAGE) 500 MG tablet Take 500 mg by mouth 2 (two) times daily with a meal.    . Multiple Vitamins-Minerals (OCUVITE PRESERVISION PO) Take by mouth daily.    . polyethylene glycol powder (GLYCOLAX/MIRALAX) powder Take 17 g by mouth daily. For a bowel movement 255 g 3  . pravastatin (PRAVACHOL) 80 MG tablet Take 80 mg by mouth daily.    . Tamsulosin HCl (FLOMAX) 0.4 MG CAPS Take 0.4 mg by mouth daily.     No current facility-administered medications for this visit.    Allergies as of 12/21/2014  . (No Known Allergies)    ROS:  General: Negative for anorexia, fever, chills, fatigue, weakness. See history of present illness  ENT: Negative for hoarseness, difficulty swallowing , nasal congestion. CV: Negative for chest pain, angina, palpitations, dyspnea on exertion, peripheral edema.  Respiratory: Negative for dyspnea at rest, dyspnea on exertion, cough, sputum, wheezing.  GI: See history of present illness. GU:  Negative for dysuria, hematuria, urinary incontinence, urinary frequency, nocturnal urination.  Endo: See history of present illness  Physical Examination:   There were no vitals taken for this visit.  General: Well-nourished, well-developed in no acute distress.  Eyes: No  icterus. Mouth: Oropharyngeal mucosa moist and pink , no lesions erythema or exudate. Lungs: Clear to auscultation bilaterally.  Heart: Regular rate and rhythm, no murmurs rubs or gallops.  Abdomen: Bowel sounds are normal, nontender, nondistended, no hepatosplenomegaly or masses, no abdominal bruits or hernia , no rebound or guarding.   Extremities: No lower extremity edema. No clubbing or deformities. Neuro: Alert and oriented x 4   Skin: Warm and dry, no jaundice.   Psych: Alert and cooperative, normal mood and affect.  Imaging Studies: No results found.

## 2014-12-21 NOTE — Assessment & Plan Note (Signed)
81-year-old gentleman with recent altered bowel function, difficulty evacuating stools and associated rectal pressure. Recent ileocolonoscopy with several cecal polyps, no rectal masses. He had anal canal hemorrhoids. MiraLAX was added several weeks ago and he was doing well up until this past weekend. Now is having trouble passing stools again. Stools are soft. More of an  issue with evacuation. Also complains of fecal incontinence if he doesn't get to the bathroom when he has the first urge to go. Is not clear how much is enlarged prostates contributing to the problem that may be multifactorial with anal canal hemorrhoids.  Initially treat hemorrhoids again with AnaMantle twice a day for 14 days. Okay to use [employer] compounded formula. He will call with progress report in 10 days or sooner if needed. He was interested in change in his bowel regimen although I felt like if his stools were somewhat looser he would have a more ease passing them but on the flip side would have to be careful not to make his incontinence worse.  Patient also notes his weight is down 10 pounds since the beginning of April. Asked him to weigh at home today and keep track. If he loses another 5 pounds and he would be approaching 10% of his body weight should consider further evaluation. He states he's going to discuss with Dr. Willey Blade at his office visit next month.

## 2014-12-22 NOTE — Progress Notes (Signed)
CC'ED TO PCP 

## 2014-12-25 ENCOUNTER — Telehealth: Payer: Self-pay | Admitting: Internal Medicine

## 2014-12-30 NOTE — Telephone Encounter (Signed)
Records reviewed by Dr. Henrene Pastor and Dr. Deatra Ina and both have declined.  Pt informed.  Will follow up with Dr. Gala Romney.

## 2015-01-26 ENCOUNTER — Telehealth: Payer: Self-pay | Admitting: Internal Medicine

## 2015-01-26 ENCOUNTER — Ambulatory Visit: Payer: Medicare Other | Admitting: Internal Medicine

## 2015-01-26 NOTE — Telephone Encounter (Signed)
Pt was a no show

## 2015-02-23 ENCOUNTER — Ambulatory Visit (INDEPENDENT_AMBULATORY_CARE_PROVIDER_SITE_OTHER): Payer: Medicare Other | Admitting: Internal Medicine

## 2015-02-23 ENCOUNTER — Encounter: Payer: Self-pay | Admitting: Internal Medicine

## 2015-02-23 ENCOUNTER — Telehealth: Payer: Self-pay

## 2015-02-23 VITALS — BP 127/68 | HR 68 | Temp 97.2°F | Ht 67.0 in | Wt 145.4 lb

## 2015-02-23 DIAGNOSIS — R198 Other specified symptoms and signs involving the digestive system and abdomen: Secondary | ICD-10-CM

## 2015-02-23 DIAGNOSIS — R194 Change in bowel habit: Secondary | ICD-10-CM | POA: Diagnosis not present

## 2015-02-23 DIAGNOSIS — R350 Frequency of micturition: Secondary | ICD-10-CM | POA: Diagnosis not present

## 2015-02-23 NOTE — Progress Notes (Signed)
Primary Care Physician:  Asencion Noble, MD Primary Gastroenterologist:  Dr. Gala Romney  Pre-Procedure History & Physical: HPI:  Jordan [name] is a 81 y.o. male here for a good 8-9 month history of perceived difficulty evacuating stool. Complains of "hemorrhoidal swelling" contributing to difficulties with  passage of stool. Findings of recent colonoscopy as outlined below. Since May, with the addition of MiraLax and fiber ?, bowel function has changed directions.  He now has 5 to 8 , relatively loose,bowel movements with some bouts of incontinence. He has significant increased urinary frequency urinating at least 12 times a day over the past several weeks as well. Takes Flomax.  He appears to have mild dementia. Prior CT demonstrated asymmetrical enlarged prostate and a left inguinal hernia. Patient denies any bleeding. Patient desires something for hemorrhoid "swelling".   Past Medical History  Diagnosis Date  . Hypertension   . Hypercholesterolemia   . Diabetes mellitus   . CAD (coronary artery disease)     with stent  . BPH (benign prostatic hyperplasia)   . Adenoma of large intestine     Past Surgical History  Procedure Laterality Date  . Hernia repair      right groin  . Coronary angioplasty with stent placement      RCA  . Colonoscopy  June 2004    Dr. Sharlett Iles: notes hx of adenomatous polyps. no polyps at time of exam, noted pancolonic diverticula.   . Colonoscopy  11/16/2014    RMR: Anal canal hemorroids. Pancolonic diverticulosis. Multiple cecal polyps removed as described above. Patient clinically, much improved on daily fiber and Miralax.     Prior to Admission medications   Medication Sig Start Date End Date Taking? Authorizing Provider  aspirin 81 MG tablet Take 81 mg by mouth daily.   Yes Historical Provider, MD  donepezil (ARICEPT) 10 MG tablet Take 10 mg by mouth at bedtime as needed.   Yes Historical Provider, MD  enalapril (VASOTEC) 10 MG tablet Take 10 mg by  mouth daily.   Yes Historical Provider, MD  hydrochlorothiazide (MICROZIDE) 12.5 MG capsule Take 12.5 mg by mouth daily.   Yes Historical Provider, MD  metFORMIN (GLUCOPHAGE) 500 MG tablet Take 500 mg by mouth 2 (two) times daily with a meal.   Yes Historical Provider, MD  polyethylene glycol powder (GLYCOLAX/MIRALAX) powder Take 17 g by mouth daily. For a bowel movement 07/02/12  Yes Orvil Feil, NP  pravastatin (PRAVACHOL) 80 MG tablet Take 80 mg by mouth daily.   Yes Historical Provider, MD  Tamsulosin HCl (FLOMAX) 0.4 MG CAPS Take 0.4 mg by mouth daily.   Yes Historical Provider, MD    Allergies as of 02/23/2015  . (No Known Allergies)    Family History  Problem Relation Age of Onset  . Colon cancer Neg Hx     History   Social History  . Marital Status: Widowed    Spouse Name: [name]  . Number of Children: [name]  . Years of Education: [name]   Occupational History  . Not on file.   Social History Main Topics  . Smoking status: Former Research scientist (life sciences)  . Smokeless tobacco: Not on file     Comment: Quit in 1984  . Alcohol Use: No  . Drug Use: No  . Sexual Activity: Not on file   Other Topics Concern  . Not on file   Social History Narrative    Review of Systems: See HPI, otherwise negative ROS  Physical Exam: BP 127/68  mmHg  Pulse 68  Temp(Src) 97.2 F (36.2 C) (Oral)  Ht '5\' 7"'$  (1.702 m)  Wt 145 lb 6.4 oz (65.953 kg)  BMI 22.77 kg/m2 General:   Pleasant elderly and cooperative in NAD Skin:  Intact without significant lesions or rashes. Eyes:  Sclera clear, no icterus.   Conjunctiva pink. Ears:  Normal auditory acuity. Nose:  No deformity, discharge,  or lesions. Mouth:  No deformity or lesions. Neck:  Supple; no masses or thyromegaly. No significant cervical adenopathy. Lungs:  Clear throughout to auscultation.   No wheezes, crackles, or rhonchi. No acute distress. Heart:  Regular rate and rhythm; no murmurs, clicks, rubs,  or gallops. Abdomen: Non-distended, normal  bowel sounds.  Soft and nontender without appreciable mass or hepatosplenomegaly. I do not appreciate a gross honey or Pulses:  Normal pulses noted. Extremities:  Without clubbing or edema. Rectal exam:  No external lesions.  Prostrate is symmetrically enlarged without nodularity. There is a generous amount of brown stool in rectal vault there is no impaction. Rectal exam is entirely nontender. Stool is Hemoccult negative.   Impression:  Pleasant 81 year old male with difficulties achieving a bowel movement earlier this year now with excessive bowel function, tendency towards loose stools and fecal incontinence on MiraLax and another agent for constipation  He does have urinary tract symptoms. He has prostatomegaly. He does take Flomax.  I suppose it could be as simple as him taking an excessive constipation regimen. We really need to determine what his been taking at home. History of an asymmetrically enlarged prostate and a left inguinal hernia on prior CT scanning. Patient procedures he had "swollen hemorrhoids" which started his problems this year. This is not likely. It is possible patient may have had an anal fissure which could have impeded evacuation. He has requested something to see this hemorrhoids in addition to creams already prescribed. He appears to have some dementia.  He is taking metformin.  Recommendations:  Urinalysis today. Patient is to retrieve his bowel regimen meds and bring them to the office for our review. I may well recommend changes in his bowel regimen in the near future. In addition, I told the patient that he may need further imaging via a repeat CT scan. We'll obtain a urinalysis today. Further recommendations to follow.  I have given him a box of fleets medicated wipes to be used no more than 4 times daily as needed for anorectal symptoms.   Notice: This dictation was prepared with Dragon dictation along with smaller phrase technology. Any transcriptional errors that  result from this process are unintentional and may not be corrected upon review.   Addendum:  Patient brought his preparation he was taking at the request of Dr. Willey Blade. It turns out to be EQUATE fiber therapy "compare to [place]" not MiraLax.  I recommend that the patient continue taking EQUATE fiber therapy daily. However, I recommended MiraLax be decreased to one half of of a capful at bedtime. Further adjustments may be needed. Further evaluation may be needed. Urinalysis pending.

## 2015-02-23 NOTE — Telephone Encounter (Signed)
I called the pt and gave him RMR recommendations from his addendum to his office visit note. He is aware how he should take the miralax.

## 2015-02-23 NOTE — Patient Instructions (Signed)
Please bring medication you are taking for your bowels  Urinalysis today - for increased urinary frequency  Fleets wipes  - for hemorrhoids - use no more than 4x daily  Further recommendations to follow. As discussed, further studies may be needed

## 2015-02-26 ENCOUNTER — Telehealth: Payer: Self-pay

## 2015-02-26 NOTE — Telephone Encounter (Signed)
Labs from [place] received. Place on RMR desk

## 2015-03-02 ENCOUNTER — Telehealth: Payer: Self-pay

## 2015-03-02 NOTE — Telephone Encounter (Signed)
Per RMR-   Urinalysis report on my desk. Urinalysis okay. Please call patient. See how he is doing with the bowel regimen prescribed last week. Further recommendations to follow pending progress report

## 2015-03-02 NOTE — Telephone Encounter (Signed)
Tried to call pt- NA- LMOM, asked pt to call back and let us know how he was doing.

## 2015-03-03 NOTE — Telephone Encounter (Signed)
I spoke with the pt, he said he has cut back on the miralax and he has noticed that the stool is somewhat firmer now. He is still having around 5 bm's a day but is feeling better. He said he urinates around 10 times a day. He is aware of his results and he said he is doing better but if you have any further recommendations to let him know. He is aware that RMR is not here today.

## 2015-03-04 NOTE — Telephone Encounter (Signed)
Tried to call pt- NA 

## 2015-03-04 NOTE — Telephone Encounter (Signed)
Drop back to 1/2 capful of Miralax every other day.  See PCP about polyuria.

## 2015-03-05 NOTE — Telephone Encounter (Signed)
Tried to call pt- NA, no voicemail 

## 2015-03-08 NOTE — Telephone Encounter (Signed)
Pt is aware of recommendations. He said now that his stool is firming up he feels some pain when he has a bm. He thinks it may be a fissure ( he said RMR has said something about this before)   He wants to know if we can send in a cream for the fissure to [facility]?  He is aware that RMR is out of the office today and it will be tomorrow before this is addressed. He said he was fine with that and will check the pharmacy tomorrow afternoon.

## 2015-03-08 NOTE — Telephone Encounter (Signed)
Tried to call pt- NA- LMOM 

## 2015-03-10 NOTE — Telephone Encounter (Signed)
Make sure he is taking equate brand fiber daily. Continue MiraLAX one half capful at bedtime nightly.  Prescription to [facility] for nitroglycerin 0.125% cream - needs compounding. Apply pea-sized amount to the anorectum 3 times daily. Sit down for 30 minutes just in case he has any hypotension or headache after instilling medication. This is a low dose. He should do okay. 30 g prescription with one refill.  Make sure Dr. Willey Blade gets a copy of the urinalysis and this note. Have patient follow-up with Dr. Willey Blade regarding polyuria.

## 2015-03-10 NOTE — Telephone Encounter (Signed)
cc'd pcp 

## 2015-03-10 NOTE — Telephone Encounter (Signed)
Tried to call pt- NA. LMOM with recommendations and instructions.

## 2015-03-10 NOTE — Telephone Encounter (Signed)
rx has been called in to [place] at [facility].

## 2015-04-06 ENCOUNTER — Encounter: Payer: Self-pay | Admitting: Internal Medicine

## 2015-04-26 ENCOUNTER — Telehealth: Payer: Self-pay

## 2015-04-26 NOTE — Telephone Encounter (Signed)
Pt came by the office and left a note for Dr.Rourk and it says:  Dr.Rourk- Very unnatural bowel movements. Next to no control.  5-8 movements each day. Most soft small amounts.  Using hemorrhoidal ointment once daily.  Using nitroglycerin oint twice daily, and experience a sensitive touch to an organ when using.  Should I get it a refill when all is used?  presently taking 1/2 measurement of miralax daily.

## 2015-04-27 NOTE — Telephone Encounter (Signed)
Pt came by the office- he is having multiple bm's daily but they are small and soft, no diarrhea. He said it is very painful and he is still using both of the ointments (hemorrhoid and nitro) they are not helping. He is taking fiber one daily with a fruit and 2 fiber tablets daily. He is taking miralax daily to every other day. He said his bm's are soft and very little, less than a teaspoon full. Sometimes he doesn't make it to the bathroom. He brought in a sample bottle of linzess 290 that his pcp (Dr.Fagan) gave him and wants to know if he should try it again because it caused him to be cleaned out completely, but he is afraid of the pain that it will cause.

## 2015-04-27 NOTE — Telephone Encounter (Signed)
Pt is aware.  

## 2015-04-27 NOTE — Telephone Encounter (Signed)
Patient notes previously 5 - 8  bowel movements daily. Recommendation for decrease MiraLAX is unchanged. Would hold off on Linzess for  right now. Would start Benefiber as previously recommended. Would also continue nitroglycerin ointment for the time being.

## 2015-04-27 NOTE — Telephone Encounter (Signed)
Pt is calling the office wanting to know if Dr. Gala Romney has gotten his note that the he dropped off

## 2015-04-27 NOTE — Telephone Encounter (Signed)
I attempted to call patient today at his home number [phone]. No answer.  I would recommend if he is having no further anorectal pain, would stop all topical agents. If he is having 5-8 bowel movements daily, would also hold off on MiraLAX and take only as needed for constipation. I think he needs to be on a substantial fiber supplement. I recommend my favorite,   Benefiber 2 teaspoons twice daily instead of equate brand of fiber .  Please convey these recommendations to this very nice gentleman.

## 2015-04-27 NOTE — Telephone Encounter (Signed)
Pt said he was with his son who was in the ED. If he is not home, we can leave a detailed message on his answering machine.

## 2015-04-27 NOTE — Telephone Encounter (Signed)
Tried to call pt- NA- LMOM 

## 2015-04-29 NOTE — Telephone Encounter (Signed)
Pt called today- he has had a slowing in his bm's since he came by the office the other day, but he said he still cant control his bm's and his stool is pencil thin and he is straining a lot. He is not having as many bm's as he was before. RMR will be in the office tomorrow and has had a cancellation on his schedule. Offered pt the 9:30 ov with RMR and he said yes he would definitely be her tomorrow to see RMR.

## 2015-04-30 ENCOUNTER — Ambulatory Visit (INDEPENDENT_AMBULATORY_CARE_PROVIDER_SITE_OTHER): Payer: Medicare Other | Admitting: Internal Medicine

## 2015-04-30 ENCOUNTER — Encounter: Payer: Self-pay | Admitting: Internal Medicine

## 2015-04-30 VITALS — BP 122/63 | HR 62 | Temp 97.1°F | Ht 68.0 in | Wt 141.4 lb

## 2015-04-30 DIAGNOSIS — R194 Change in bowel habit: Secondary | ICD-10-CM

## 2015-04-30 NOTE — Progress Notes (Signed)
Primary Care Physician:  Asencion Noble, MD Primary Gastroenterologist:  Dr. Gala Romney  Pre-Procedure History & Physical: HPI:  Jordan [name] is a 81 y.o. male here for vault the bowel symptoms. Patient states he can have upwards of a dozen plus bowel movements daily. All soft tissue/performed. Feels the need to go. Doesn't feel he can get the stool out adequately. At other times, has episodes of fecal incontinence. Never has had watery diarrhea or blood per rectum. Hemoccult negative and no impaction at last office visit. Colonoscopy findings as previously documented.  He was taking MiraLax daily. We recommended he stop that about a week ago. Doesn't perceive any change.  Past Medical History  Diagnosis Date  . Hypertension   . Hypercholesterolemia   . Diabetes mellitus ([place])   . CAD (coronary artery disease)     with stent  . BPH (benign prostatic hyperplasia)   . Adenoma of large intestine     Past Surgical History  Procedure Laterality Date  . Hernia repair      right groin  . Coronary angioplasty with stent placement      RCA  . Colonoscopy  June 2004    Dr. Sharlett Iles: notes hx of adenomatous polyps. no polyps at time of exam, noted pancolonic diverticula.   . Colonoscopy  11/16/2014    RMR: Anal canal hemorroids. Pancolonic diverticulosis. Multiple cecal polyps removed as described above. Patient clinically, much improved on daily fiber and Miralax.     Prior to Admission medications   Medication Sig Start Date End Date Taking? Authorizing Provider  aspirin 81 MG tablet Take 81 mg by mouth daily.   Yes Historical Provider, MD  donepezil (ARICEPT) 10 MG tablet Take 10 mg by mouth at bedtime as needed.   Yes Historical Provider, MD  enalapril (VASOTEC) 10 MG tablet Take 10 mg by mouth daily.   Yes Historical Provider, MD  hydrochlorothiazide (MICROZIDE) 12.5 MG capsule Take 12.5 mg by mouth daily.   Yes Historical Provider, MD  metFORMIN (GLUCOPHAGE) 500 MG tablet Take 500 mg  by mouth 2 (two) times daily with a meal.   Yes Historical Provider, MD  pravastatin (PRAVACHOL) 80 MG tablet Take 80 mg by mouth daily.   Yes Historical Provider, MD  Tamsulosin HCl (FLOMAX) 0.4 MG CAPS Take 0.4 mg by mouth daily.   Yes Historical Provider, MD  Wheat Dextrin (BENEFIBER PO) Take by mouth daily.   Yes Historical Provider, MD  polyethylene glycol powder (GLYCOLAX/MIRALAX) powder Take 17 g by mouth daily. For a bowel movement Patient not taking: Reported on 04/30/2015 07/02/12   Orvil Feil, NP    Allergies as of 04/30/2015  . (No Known Allergies)    Family History  Problem Relation Age of Onset  . Colon cancer Neg Hx     Social History   Social History  . Marital Status: Widowed    Spouse Name: [name]  . Number of Children: [name]  . Years of Education: [name]   Occupational History  . Not on file.   Social History Main Topics  . Smoking status: Former Research scientist (life sciences)  . Smokeless tobacco: Not on file     Comment: Quit in 1984  . Alcohol Use: No  . Drug Use: No  . Sexual Activity: Not on file   Other Topics Concern  . Not on file   Social History Narrative    Review of Systems: See HPI, otherwise negative ROS  Physical Exam: BP 122/63 mmHg  Pulse 62  Temp(Src) 97.1 F (36.2 C) (Oral)  Ht '5\' 8"'$  (1.727 m)  Wt 141 lb 6.4 oz (64.139 kg)  BMI 21.50 kg/m2 General:   Alert,   pleasant and cooperative in NAD Mouth:  No deformity or lesions. Lungs:  Clear throughout to auscultation.   No wheezes, crackles, or rhonchi. No acute distress. Heart:  Regular rate and rhythm; no murmurs, clicks, rubs,  or gallops. Abdomen: Non-distended, normal bowel sounds.  Soft and nontender without appreciable mass or hepatosplenomegaly.  Pulses:  Normal pulses noted. Extremities:  Without clubbing or edema.  Impression:  81 year old gentleman with at least a several month history of difficulties with bowel function. Complains of difficulty evacuating multiple small, nonbloody soft  stools. Denies overt diarrhea. Absolutely no abdominal pain. No obstructing rectosigmoid lesion found at colonoscopy earlier this year. Hemoccult negative and no impaction last visit. UsingTopical nitroglycerin and other hemorrhoid preparations for irritated anorectum - ongoing. Stopped MiraLax several days ago without much decline in bowel frequency. Along with multiple formed BMs, he does have intermittent episodes of incontinence.  This has been a marked deviation from of his lifelong bowel function. Again, denies overt diarrhea. He has really not been having any diarrhea , therefore, workup for diarrhea is not felt to be indicated at this time. I have reviewed his medications and I wonder if metformin is now producing some of his bowel symptoms. I called Dr. Willey Blade and discussed. We will decided to hold the metformin for the next 10 days and to see what difference that makes. Will give give some consideration to an air-contrast barium study to rule out a gross anatomical abnormality like a rectocele not appreciated at colonoscopy if metformin cessation doesn't help  Recommendations:   Stop the Miralax  Continue Benefiber - 2 teaspoon daily  Stop Metformin for now (I discussed with Dr. Willey Blade)  May continue using NTG and hemorrhoid cream as needed.  Call Monday, October 24 and let me how you are doing and we will go from there.   Notice: This dictation was prepared with Dragon dictation along with smaller phrase technology. Any transcriptional errors that result from this process are unintentional and may not be corrected upon review.

## 2015-04-30 NOTE — Patient Instructions (Signed)
Stop the Miralax  Continue Benefiber - 2 teaspoon daily  Stop Metformin for now (I discussed with Dr. Willey Blade)  May continue using NTG and hemorrhoid cream.  Call me Monday, October 24 and let me how you are doing

## 2015-05-03 ENCOUNTER — Telehealth: Payer: Self-pay

## 2015-05-03 ENCOUNTER — Other Ambulatory Visit: Payer: Self-pay

## 2015-05-03 DIAGNOSIS — R197 Diarrhea, unspecified: Secondary | ICD-10-CM

## 2015-05-03 LAB — CBC WITH DIFFERENTIAL/PLATELET
BASOS ABS: 0 10*3/uL (ref 0.0–0.1)
BASOS PCT: 0 % (ref 0–1)
EOS PCT: 1 % (ref 0–5)
Eosinophils Absolute: 0.1 10*3/uL (ref 0.0–0.7)
HCT: 39.5 % (ref 39.0–52.0)
Hemoglobin: 13.2 g/dL (ref 13.0–17.0)
Lymphocytes Relative: 17 % (ref 12–46)
Lymphs Abs: 1.1 10*3/uL (ref 0.7–4.0)
MCH: 30 pg (ref 26.0–34.0)
MCHC: 33.4 g/dL (ref 30.0–36.0)
MCV: 89.8 fL (ref 78.0–100.0)
MPV: 9.7 fL (ref 8.6–12.4)
Monocytes Absolute: 0.5 10*3/uL (ref 0.1–1.0)
Monocytes Relative: 8 % (ref 3–12)
Neutro Abs: 4.6 10*3/uL (ref 1.7–7.7)
Neutrophils Relative %: 74 % (ref 43–77)
PLATELETS: 383 10*3/uL (ref 150–400)
RBC: 4.4 MIL/uL (ref 4.22–5.81)
RDW: 12.6 % (ref 11.5–15.5)
WBC: 6.2 10*3/uL (ref 4.0–10.5)

## 2015-05-03 LAB — BASIC METABOLIC PANEL
BUN: 25 mg/dL (ref 7–25)
CHLORIDE: 97 mmol/L — AB (ref 98–110)
CO2: 30 mmol/L (ref 20–31)
Calcium: 9 mg/dL (ref 8.6–10.3)
Creat: 1.01 mg/dL (ref 0.70–1.11)
Glucose, Bld: 266 mg/dL — ABNORMAL HIGH (ref 65–99)
POTASSIUM: 4 mmol/L (ref 3.5–5.3)
Sodium: 136 mmol/L (ref 135–146)

## 2015-05-03 NOTE — Telephone Encounter (Signed)
Pt called with a progress report- he said he want to the bathroom 21x on Saturday and 23x on Sunday. He said it was just small amounts each time and he is not able to make it to the bathroom most of the time. He said he has a lot of rectal pain and feels like it is blocked. He feels like he is getting weak.

## 2015-05-03 NOTE — Telephone Encounter (Signed)
Tried to call pt- NA- LMOM 

## 2015-05-03 NOTE — Telephone Encounter (Signed)
Pt is aware and has picked up stool container and lab orders.

## 2015-05-03 NOTE — Telephone Encounter (Signed)
CBC and basic metabolic profile today. Stool sample for Clostridium difficile. May pursue a barium contrast study (barium enema) in the near future. Continue to hold metformin.  In fact, upon review of labs including C. difficile results, unless something is abnormal, will pursue an air-contrast barium enema later this week.

## 2015-05-04 ENCOUNTER — Other Ambulatory Visit: Payer: Self-pay

## 2015-05-04 DIAGNOSIS — R197 Diarrhea, unspecified: Secondary | ICD-10-CM

## 2015-05-04 LAB — CLOSTRIDIUM DIFFICILE BY PCR

## 2015-05-05 ENCOUNTER — Other Ambulatory Visit: Payer: Self-pay

## 2015-05-05 ENCOUNTER — Ambulatory Visit (HOSPITAL_COMMUNITY)
Admission: RE | Admit: 2015-05-05 | Discharge: 2015-05-05 | Disposition: A | Discharge status: Home / Self Care | Payer: Medicare Other | Source: Ambulatory Visit | Attending: Internal Medicine | Admitting: Internal Medicine

## 2015-05-05 ENCOUNTER — Ambulatory Visit (HOSPITAL_COMMUNITY): Payer: Medicare Other

## 2015-05-05 DIAGNOSIS — R197 Diarrhea, unspecified: Secondary | ICD-10-CM | POA: Diagnosis present

## 2015-05-05 DIAGNOSIS — K5730 Diverticulosis of large intestine without perforation or abscess without bleeding: Secondary | ICD-10-CM | POA: Diagnosis not present

## 2015-05-05 DIAGNOSIS — R102 Pelvic and perineal pain: Secondary | ICD-10-CM | POA: Diagnosis not present

## 2015-05-05 DIAGNOSIS — N433 Hydrocele, unspecified: Secondary | ICD-10-CM | POA: Insufficient documentation

## 2015-05-05 DIAGNOSIS — I77811 Abdominal aortic ectasia: Secondary | ICD-10-CM | POA: Insufficient documentation

## 2015-05-05 DIAGNOSIS — K4090 Unilateral inguinal hernia, without obstruction or gangrene, not specified as recurrent: Secondary | ICD-10-CM | POA: Insufficient documentation

## 2015-05-05 DIAGNOSIS — N400 Enlarged prostate without lower urinary tract symptoms: Secondary | ICD-10-CM | POA: Insufficient documentation

## 2015-05-06 NOTE — Progress Notes (Signed)
Routing to [employer]

## 2015-05-06 NOTE — Progress Notes (Signed)
Quick Note:  See staff message ______

## 2015-05-07 ENCOUNTER — Other Ambulatory Visit (HOSPITAL_COMMUNITY): Payer: Medicare Other

## 2015-05-07 NOTE — Progress Notes (Signed)
Quick Note:  Addendum was done and results were call to Dr. Gala Romney ______

## 2015-05-10 ENCOUNTER — Other Ambulatory Visit: Payer: Self-pay

## 2015-05-10 ENCOUNTER — Telehealth: Payer: Self-pay | Admitting: General Practice

## 2015-05-10 DIAGNOSIS — N816 Rectocele: Secondary | ICD-10-CM

## 2015-05-10 NOTE — Telephone Encounter (Signed)
-----   Message from Daneil Dolin, MD sent at 05/10/2015 11:30 AM EDT ----- I appreciate input from Dr. Dalbert Batman.  Lets proceed with a referral to [name] to get her assessment regarding feasibility of a rectopexy, etc. to definitively deal with his problem.     ----- Message -----    From: Fanny Skates, MD    Sent: 05/10/2015   8:43 AM      To: Fanny Skates, MD, [name], MD, #  Ronalee Belts, I saw your noted regarding this 81 year old man with apparent rectocele. If his performance status is good then I would advise that he be evaluated by a fellowship trained colorectal surgeon.  [name], M.D. Is a fellowship trained Sports administrator in our practice here in [place].  I know that she would be happy to evaluate and give an opinion, if he chooses to be evaluated in [place].  If he chooses [facility], then I would be sure that he is referred to their fellowship trained colorectal surgeon.  Best, Renelda Loma

## 2015-05-10 NOTE — Telephone Encounter (Signed)
Per Candy referral has been made

## 2015-05-10 NOTE — Progress Notes (Signed)
Quick Note:  Per Candy referral has been made, routing to [facility] for documentation ______

## 2015-05-10 NOTE — Progress Notes (Signed)
Quick Note:  We need to contact Dr. Dalbert Batman via telephone. I don't think he's on Epic anymore ______

## 2015-05-31 ENCOUNTER — Encounter (HOSPITAL_BASED_OUTPATIENT_CLINIC_OR_DEPARTMENT_OTHER): Payer: Self-pay | Admitting: *Deleted

## 2015-06-01 ENCOUNTER — Encounter (HOSPITAL_BASED_OUTPATIENT_CLINIC_OR_DEPARTMENT_OTHER): Payer: Self-pay | Admitting: *Deleted

## 2015-06-01 ENCOUNTER — Other Ambulatory Visit: Payer: Self-pay | Admitting: General Surgery

## 2015-06-01 NOTE — H&P (Signed)
Jordan [name] 05/25/2015 11:02 AM Location: [facility] Patient #: [id number] DOB: 1923/01/05 Widowed / Language: Jordan [name] / Race: White Male   History of Present Illness Jordan Ruff MD; 10/19/4096 11:57 AM) Patient words: reck.  The patient is a 81 year old male who presents with fecal incontinence. 81 year old male who presents to the office with fecal urgency. The patient reports almost constant urgency symptoms. He then goes to the bathroom and is only able to expel a small amount of stool. He does endorse draining. He has been on multiple fiber supplements and MiraLAX. He states that these symptoms started approximately 2 years ago. His colonoscopy was normal. He had a CT scan, which showed some perineal descent with defecation and a small rectocele. He denies any fecal incontinence.   Other Problems Jordan [name], Jordan [name]; 05/25/2015 11:03 AM) Back Pain Diabetes Mellitus Enlarged Prostate Hemorrhoids High blood pressure Hypercholesterolemia Inguinal Hernia  Diagnostic Studies History Jordan [name], CMA; 05/25/2015 11:03 AM) Colonoscopy within last year  Allergies ([place], [place]; 05/25/2015 11:03 AM) No Known Drug Allergies11/02/2015  Medication History (Jordan [name], CMA; 05/25/2015 11:05 AM) Aspirin ('81MG'$  Tablet, Oral) Active. Aricept ('10MG'$  Tablet, Oral) Active. Enalapril Maleate ('10MG'$  Tablet, Oral) Active. HydroCHLOROthiazide (12.'5MG'$  Capsule, Oral) Active. MetFORMIN HCl ('500MG'$  Tablet, Oral) Active. Pravastatin Sodium ('80MG'$  Tablet, Oral) Active. Tamsulosin HCl (0.'4MG'$  Capsule, Oral) Active. Benefiber (Oral) Active. Medications Reconciled    Review of Systems (Jordan [name] CMA; 05/25/2015 11:03 AM) General Not Present- Appetite Loss, Chills, Fatigue, Fever, Night Sweats, Weight Gain and Weight Loss. Skin Present- Dryness. Not Present- Change in Wart/Mole, Hives, Jaundice, New Lesions, Non-Healing Wounds, Rash and Ulcer. HEENT Present-  Hearing Loss, Visual Disturbances and Wears glasses/contact lenses. Not Present- Earache, Hoarseness, Nose Bleed, Oral Ulcers, Ringing in the Ears, Seasonal Allergies, Sinus Pain, Sore Throat and Yellow Eyes. Cardiovascular Present- Leg Cramps. Not Present- Chest Pain, Difficulty Breathing Lying Down, Palpitations, Rapid Heart Rate, Shortness of Breath and Swelling of Extremities. Gastrointestinal Present- Change in Bowel Habits, Constipation, Excessive gas, Hemorrhoids and Rectal Pain. Not Present- Abdominal Pain, Bloating, Bloody Stool, Chronic diarrhea, Difficulty Swallowing, Gets full quickly at meals, Indigestion, Nausea and Vomiting. Male Genitourinary Present- Change in Urinary Stream, Frequency, Impotence, Nocturia, Painful Urination, Urgency and Urine Leakage. Not Present- Blood in Urine. Musculoskeletal Present- Back Pain. Not Present- Joint Pain, Joint Stiffness, Muscle Pain, Muscle Weakness and Swelling of Extremities. Neurological Present- Decreased Memory. Not Present- Fainting, Headaches, Numbness, Seizures, Tingling, Tremor, Trouble walking and Weakness. Endocrine Present- Cold Intolerance. Not Present- Excessive Hunger, Hair Changes, Heat Intolerance, Hot flashes and New Diabetes. Hematology Present- Easy Bruising and Excessive bleeding. Not Present- Gland problems, HIV and Persistent Infections.  Vitals (Jordan [name] CMA; 05/25/2015 11:03 AM) 05/25/2015 11:03 AM Weight: 141.8 lb Height: 68in Body Surface Area: 1.77 m Body Mass Index: 21.56 kg/m  Temp.: 97.36F(Temporal)  Pulse: 81 (Regular)  BP: 126/72 (Sitting, Left Arm, Standard)       Physical Exam Jordan Ruff MD; 05/25/1477 11:58 AM) General Mental Status-Alert. General Appearance-Not in acute distress. Build & Nutrition-Well nourished. Posture-Normal posture. Gait-Normal.  Head and Neck Head-normocephalic, atraumatic with no lesions or palpable masses. Trachea-midline.  Chest and Lung  Exam Chest and lung exam reveals -on auscultation, normal breath sounds, no adventitious sounds and normal vocal resonance.  Cardiovascular Cardiovascular examination reveals -normal heart sounds, regular rate and rhythm with no murmurs.  Abdomen Inspection Inspection of the abdomen reveals - No Hernias. Palpation/Percussion Palpation and Percussion of the abdomen reveal - Soft, Non Tender, No Rigidity (guarding),  No hepatosplenomegaly and No Palpable abdominal masses.  Rectal Anorectal Exam External - normal sphincter . Note: Small posterior lesion that expresses purulence. Fistula probe unable to be passed through this. Slightly tender to palpation. Internal - normal sphincter tone, palpable rectal shelf, no fecal impaction.  Neurologic Neurologic evaluation reveals -alert and oriented x 3 with no impairment of recent or remote memory, normal attention span and ability to concentrate, normal sensation and normal coordination.  Musculoskeletal Normal Exam - Bilateral-Upper Extremity Strength Normal and Lower Extremity Strength Normal.   Results Jordan Ruff MD; 70/09/5007 11:59 AM) Procedures  Name Value Date ANOSCOPY, DIAGNOSTIC (38182) [ Hemorrhoids ] Procedure Other: Procedure: Anoscopy Surgeon: Marcello Moores After the risks and benefits were explained, verbal consent was obtained for above procedure. A medical assistant chaperone was present thoroughout the entire procedure. Anesthesia: none Diagnosis: Fecal incontinence Findings: Normal sphincter tone, moderate squeeze, appropriate for age. Slight rectocele palpated, perineal bulging noted. Lesion noted at posterior midline that expresses purulence. Fistula probe unable to be passed through this. Internally the patient has redundant mucosa without any overt signs of prolapse. There are no hemorrhoid columns noted internally. There is soft stool in the rectal vault. The patient is not impacted.  Performed: 05/25/2015  11:58 AM    Assessment & Plan Jordan Ruff MD; 99/09/7167 12:01 PM) DEFECATION URGENCY (R15.2) Impression: 81 year old male who presents to the office with significant urgency. On exam he does have indications of some pelvic outlet defecation problems. He also has a posterior midline lesion that is concerning for possible fistula. I have recommended an exam under anesthesia. If he does have a fistula, this may account for some of his urgency issues. If the exam is negative, we will proceed with MR defacography.

## 2015-06-01 NOTE — Progress Notes (Signed)
NPO AFTER MN.  ARRIVE AT 0830.  NEEDS ISTAT AND EKG.

## 2015-06-03 ENCOUNTER — Ambulatory Visit (HOSPITAL_BASED_OUTPATIENT_CLINIC_OR_DEPARTMENT_OTHER): Payer: Medicare Other | Admitting: Anesthesiology

## 2015-06-03 ENCOUNTER — Ambulatory Visit (HOSPITAL_BASED_OUTPATIENT_CLINIC_OR_DEPARTMENT_OTHER)
Admission: RE | Admit: 2015-06-03 | Discharge: 2015-06-03 | Disposition: A | Discharge status: Home / Self Care | Payer: Medicare Other | Source: Ambulatory Visit | Attending: General Surgery | Admitting: General Surgery

## 2015-06-03 ENCOUNTER — Encounter (HOSPITAL_BASED_OUTPATIENT_CLINIC_OR_DEPARTMENT_OTHER): Payer: Self-pay

## 2015-06-03 ENCOUNTER — Encounter (HOSPITAL_BASED_OUTPATIENT_CLINIC_OR_DEPARTMENT_OTHER)
Admission: RE | Disposition: A | Discharge status: Home / Self Care | Payer: Self-pay | Source: Ambulatory Visit | Attending: General Surgery

## 2015-06-03 DIAGNOSIS — Z955 Presence of coronary angioplasty implant and graft: Secondary | ICD-10-CM | POA: Insufficient documentation

## 2015-06-03 DIAGNOSIS — I444 Left anterior fascicular block: Secondary | ICD-10-CM | POA: Diagnosis not present

## 2015-06-03 DIAGNOSIS — K603 Anal fistula: Secondary | ICD-10-CM | POA: Insufficient documentation

## 2015-06-03 DIAGNOSIS — K648 Other hemorrhoids: Secondary | ICD-10-CM | POA: Diagnosis not present

## 2015-06-03 DIAGNOSIS — I10 Essential (primary) hypertension: Secondary | ICD-10-CM | POA: Insufficient documentation

## 2015-06-03 DIAGNOSIS — I2510 Atherosclerotic heart disease of native coronary artery without angina pectoris: Secondary | ICD-10-CM | POA: Diagnosis not present

## 2015-06-03 DIAGNOSIS — Z87891 Personal history of nicotine dependence: Secondary | ICD-10-CM | POA: Diagnosis not present

## 2015-06-03 DIAGNOSIS — R001 Bradycardia, unspecified: Secondary | ICD-10-CM | POA: Diagnosis not present

## 2015-06-03 DIAGNOSIS — E7800 Pure hypercholesterolemia, unspecified: Secondary | ICD-10-CM | POA: Insufficient documentation

## 2015-06-03 DIAGNOSIS — E119 Type 2 diabetes mellitus without complications: Secondary | ICD-10-CM | POA: Insufficient documentation

## 2015-06-03 DIAGNOSIS — Z7982 Long term (current) use of aspirin: Secondary | ICD-10-CM | POA: Insufficient documentation

## 2015-06-03 DIAGNOSIS — Z7984 Long term (current) use of oral hypoglycemic drugs: Secondary | ICD-10-CM | POA: Insufficient documentation

## 2015-06-03 DIAGNOSIS — R159 Full incontinence of feces: Secondary | ICD-10-CM | POA: Diagnosis present

## 2015-06-03 DIAGNOSIS — Z79899 Other long term (current) drug therapy: Secondary | ICD-10-CM | POA: Diagnosis not present

## 2015-06-03 HISTORY — DX: Fecal urgency: R15.2

## 2015-06-03 HISTORY — DX: Unspecified osteoarthritis, unspecified site: M19.90

## 2015-06-03 HISTORY — DX: Unspecified symptoms and signs involving the genitourinary system: R39.9

## 2015-06-03 HISTORY — DX: Personal history of adenomatous and serrated colon polyps: Z86.0101

## 2015-06-03 HISTORY — DX: Benign prostatic hyperplasia without lower urinary tract symptoms: N40.0

## 2015-06-03 HISTORY — DX: Presence of spectacles and contact lenses: Z97.3

## 2015-06-03 HISTORY — DX: Type 2 diabetes mellitus without complications: E11.9

## 2015-06-03 HISTORY — DX: Diverticulosis of large intestine without perforation or abscess without bleeding: K57.30

## 2015-06-03 HISTORY — PX: EVALUATION UNDER ANESTHESIA WITH ANAL FISTULECTOMY: SHX5621

## 2015-06-03 HISTORY — DX: Hyperlipidemia, unspecified: E78.5

## 2015-06-03 HISTORY — DX: Personal history of colonic polyps: Z86.010

## 2015-06-03 LAB — POCT I-STAT 4, (NA,K, GLUC, HGB,HCT)
GLUCOSE: 149 mg/dL — AB (ref 65–99)
HCT: 39 % (ref 39.0–52.0)
Hemoglobin: 13.3 g/dL (ref 13.0–17.0)
POTASSIUM: 3.9 mmol/L (ref 3.5–5.1)
SODIUM: 136 mmol/L (ref 135–145)

## 2015-06-03 LAB — GLUCOSE, CAPILLARY: Glucose-Capillary: 113 mg/dL — ABNORMAL HIGH (ref 65–99)

## 2015-06-03 SURGERY — EXAM UNDER ANESTHESIA WITH ANAL FISTULECTOMY
Anesthesia: Monitor Anesthesia Care | Site: Rectum

## 2015-06-03 MED ORDER — PROPOFOL 10 MG/ML IV BOLUS
INTRAVENOUS | Status: DC | PRN
  Administered 2015-06-03 (×3): 10 mg via INTRAVENOUS

## 2015-06-03 MED ORDER — FENTANYL CITRATE (PF) 100 MCG/2ML IJ SOLN
25.0000 ug | INTRAMUSCULAR | Status: DC | PRN
  Filled 2015-06-03: qty 1

## 2015-06-03 MED ORDER — BUPIVACAINE-EPINEPHRINE (PF) 0.5% -1:200000 IJ SOLN
INTRAMUSCULAR | Status: AC
  Filled 2015-06-03: qty 30

## 2015-06-03 MED ORDER — SODIUM CHLORIDE 0.9 % IV SOLN
250.0000 mL | INTRAVENOUS | Status: DC | PRN
  Filled 2015-06-03: qty 250

## 2015-06-03 MED ORDER — PROPOFOL 500 MG/50ML IV EMUL
INTRAVENOUS | Status: DC | PRN
  Administered 2015-06-03: 25 ug/kg/min via INTRAVENOUS

## 2015-06-03 MED ORDER — LIDOCAINE HCL (CARDIAC) 20 MG/ML IV SOLN
INTRAVENOUS | Status: AC
  Filled 2015-06-03: qty 5

## 2015-06-03 MED ORDER — ACETAMINOPHEN 325 MG PO TABS
650.0000 mg | ORAL_TABLET | ORAL | Status: DC | PRN
Start: 2015-06-03 — End: 2015-06-03
  Filled 2015-06-03: qty 2

## 2015-06-03 MED ORDER — ONDANSETRON HCL 4 MG/2ML IJ SOLN
INTRAMUSCULAR | Status: AC
  Filled 2015-06-03: qty 2

## 2015-06-03 MED ORDER — ACETAMINOPHEN 650 MG RE SUPP
650.0000 mg | RECTAL | Status: DC | PRN
Start: 2015-06-03 — End: 2015-06-03
  Filled 2015-06-03: qty 1

## 2015-06-03 MED ORDER — FENTANYL CITRATE (PF) 100 MCG/2ML IJ SOLN
INTRAMUSCULAR | Status: DC | PRN
  Administered 2015-06-03 (×2): 12.5 ug via INTRAVENOUS

## 2015-06-03 MED ORDER — LIDOCAINE 5 % EX OINT
TOPICAL_OINTMENT | CUTANEOUS | Status: DC | PRN
  Administered 2015-06-03: 1

## 2015-06-03 MED ORDER — ONDANSETRON HCL 4 MG/2ML IJ SOLN
INTRAMUSCULAR | Status: DC | PRN
  Administered 2015-06-03: 4 mg via INTRAVENOUS

## 2015-06-03 MED ORDER — BUPIVACAINE-EPINEPHRINE 0.5% -1:200000 IJ SOLN
INTRAMUSCULAR | Status: DC | PRN
  Administered 2015-06-03: 30 mL

## 2015-06-03 MED ORDER — LACTATED RINGERS IV SOLN
INTRAVENOUS | Status: DC
  Administered 2015-06-03 (×2): via INTRAVENOUS
  Filled 2015-06-03: qty 1000

## 2015-06-03 MED ORDER — HYDROCODONE-ACETAMINOPHEN 5-325 MG PO TABS: 1.0000 | ORAL_TABLET | ORAL | Status: DC | PRN

## 2015-06-03 MED ORDER — SODIUM CHLORIDE 0.9 % IJ SOLN
3.0000 mL | Freq: Two times a day (BID) | INTRAMUSCULAR | Status: DC
  Filled 2015-06-03: qty 3

## 2015-06-03 MED ORDER — LIDOCAINE 5 % EX OINT
TOPICAL_OINTMENT | CUTANEOUS | Status: AC
  Filled 2015-06-03: qty 35.44

## 2015-06-03 MED ORDER — OXYCODONE HCL 5 MG PO TABS
5.0000 mg | ORAL_TABLET | ORAL | Status: DC | PRN
  Filled 2015-06-03: qty 2

## 2015-06-03 MED ORDER — LIDOCAINE HCL (CARDIAC) 20 MG/ML IV SOLN
INTRAVENOUS | Status: DC | PRN
  Administered 2015-06-03: 40 mg via INTRAVENOUS

## 2015-06-03 MED ORDER — PROPOFOL 10 MG/ML IV BOLUS
INTRAVENOUS | Status: AC
  Filled 2015-06-03: qty 20

## 2015-06-03 MED ORDER — FENTANYL CITRATE (PF) 100 MCG/2ML IJ SOLN
INTRAMUSCULAR | Status: AC
  Filled 2015-06-03: qty 2

## 2015-06-03 MED ORDER — SODIUM CHLORIDE 0.9 % IJ SOLN
3.0000 mL | INTRAMUSCULAR | Status: DC | PRN
  Filled 2015-06-03: qty 3

## 2015-06-03 SURGICAL SUPPLY — 61 items
APL SKNCLS STERI-STRIP NONHPOA (GAUZE/BANDAGES/DRESSINGS) ×4
BENZOIN TINCTURE PRP APPL 2/3 (GAUZE/BANDAGES/DRESSINGS) ×6 IMPLANT
BLADE HEX COATED 2.75 (ELECTRODE) ×3 IMPLANT
BLADE SURG 10 STRL SS (BLADE) IMPLANT
BLADE SURG 15 STRL LF DISP TIS (BLADE) ×2 IMPLANT
BLADE SURG 15 STRL SS (BLADE) ×3
BRIEF STRETCH FOR OB PAD LRG (UNDERPADS AND DIAPERS) ×6 IMPLANT
CANISTER SUCTION 2500CC (MISCELLANEOUS) ×1 IMPLANT
CLOTH BEACON ORANGE TIMEOUT ST (SAFETY) ×3 IMPLANT
COVER BACK TABLE 60X90IN (DRAPES) ×3 IMPLANT
COVER MAYO STAND STRL (DRAPES) ×3 IMPLANT
DECANTER SPIKE VIAL GLASS SM (MISCELLANEOUS) ×3 IMPLANT
DRAPE LG THREE QUARTER DISP (DRAPES) ×3 IMPLANT
DRAPE PED LAPAROTOMY (DRAPES) ×3 IMPLANT
DRAPE UNDERBUTTOCKS STRL (DRAPE) IMPLANT
DRAPE UTILITY XL STRL (DRAPES) ×3 IMPLANT
DRSG PAD ABDOMINAL 8X10 ST (GAUZE/BANDAGES/DRESSINGS) ×2 IMPLANT
ELECT BLADE 6.5 .24CM SHAFT (ELECTRODE) IMPLANT
ELECT REM PT RETURN 9FT ADLT (ELECTROSURGICAL) ×3
ELECTRODE REM PT RTRN 9FT ADLT (ELECTROSURGICAL) ×2 IMPLANT
GAUZE SPONGE 4X4 16PLY XRAY LF (GAUZE/BANDAGES/DRESSINGS) IMPLANT
GAUZE VASELINE 3X9 (GAUZE/BANDAGES/DRESSINGS) IMPLANT
GLOVE BIO SURGEON STRL SZ 6.5 (GLOVE) ×6 IMPLANT
GLOVE INDICATOR 7.0 STRL GRN (GLOVE) ×6 IMPLANT
GOWN STRL REUS W/ TWL LRG LVL3 (GOWN DISPOSABLE) ×2 IMPLANT
GOWN STRL REUS W/ TWL XL LVL3 (GOWN DISPOSABLE) ×4 IMPLANT
GOWN STRL REUS W/TWL 2XL LVL3 (GOWN DISPOSABLE) ×3 IMPLANT
GOWN STRL REUS W/TWL LRG LVL3 (GOWN DISPOSABLE) ×3
GOWN STRL REUS W/TWL XL LVL3 (GOWN DISPOSABLE) ×6
HOOK RETRACTION 12 ELAST STAY (MISCELLANEOUS) IMPLANT
HYDROGEN PEROXIDE 16OZ (MISCELLANEOUS) ×3 IMPLANT
KIT ROOM TURNOVER WOR (KITS) ×3 IMPLANT
LEGGING LITHOTOMY PAIR STRL (DRAPES) IMPLANT
LOOP VESSEL MAXI BLUE (MISCELLANEOUS) IMPLANT
MANIFOLD NEPTUNE II (INSTRUMENTS) IMPLANT
NDL HYPO 25X1 1.5 SAFETY (NEEDLE) ×1 IMPLANT
NDL SAFETY ECLIPSE 18X1.5 (NEEDLE) IMPLANT
NEEDLE HYPO 18GX1.5 SHARP (NEEDLE)
NEEDLE HYPO 25X1 1.5 SAFETY (NEEDLE) ×3 IMPLANT
NS IRRIG 500ML POUR BTL (IV SOLUTION) ×3 IMPLANT
PACK BASIN DAY SURGERY FS (CUSTOM PROCEDURE TRAY) ×3 IMPLANT
PAD ABD 8X10 STRL (GAUZE/BANDAGES/DRESSINGS) ×3 IMPLANT
PAD ARMBOARD 7.5X6 YLW CONV (MISCELLANEOUS) IMPLANT
PENCIL BUTTON HOLSTER BLD 10FT (ELECTRODE) ×3 IMPLANT
RETRACTOR STERILE 25.8CMX11.3 (INSTRUMENTS) IMPLANT
SPONGE GAUZE 4X4 12PLY STER LF (GAUZE/BANDAGES/DRESSINGS) ×3 IMPLANT
SPONGE SURGIFOAM ABS GEL 12-7 (HEMOSTASIS) IMPLANT
SUCTION FRAZIER TIP 10 FR DISP (SUCTIONS) IMPLANT
SUT CHROMIC 2 0 SH (SUTURE) ×3 IMPLANT
SUT CHROMIC 3 0 SH 27 (SUTURE) IMPLANT
SUT ETHIBOND 0 (SUTURE) IMPLANT
SUT SILK 2 0 (SUTURE)
SUT SILK 2-0 18XBRD TIE 12 (SUTURE) IMPLANT
SUT VIC AB 3-0 SH 18 (SUTURE) IMPLANT
SUT VIC AB 4-0 P-3 18XBRD (SUTURE) IMPLANT
SUT VIC AB 4-0 P3 18 (SUTURE)
SYR CONTROL 10ML LL (SYRINGE) ×3 IMPLANT
TOWEL OR 17X24 6PK STRL BLUE (TOWEL DISPOSABLE) ×3 IMPLANT
TRAY DSU PREP LF (CUSTOM PROCEDURE TRAY) ×3 IMPLANT
TUBE CONNECTING 12X1/4 (SUCTIONS) ×3 IMPLANT
YANKAUER SUCT BULB TIP NO VENT (SUCTIONS) ×3 IMPLANT

## 2015-06-03 NOTE — Anesthesia Procedure Notes (Signed)
Procedure Name: MAC Date/Time: 06/03/2015 9:48 AM Performed by: Justice Rocher Pre-anesthesia Checklist: Patient identified, Timeout performed, Emergency Drugs available, Suction available and Patient being monitored Patient Re-evaluated:Patient Re-evaluated prior to inductionOxygen Delivery Method: Simple face mask Preoxygenation: Pre-oxygenation with 100% oxygen Intubation Type: IV induction Placement Confirmation: positive ETCO2 and breath sounds checked- equal and bilateral

## 2015-06-03 NOTE — Anesthesia Preprocedure Evaluation (Signed)
Anesthesia Evaluation  Patient identified by MRN, date of birth, ID band Patient awake    Reviewed: Allergy & Precautions, H&P , NPO status , Patient's Chart, lab work & pertinent test results  Airway Mallampati: II  TM Distance: >3 FB Neck ROM: full    Dental no notable dental hx. (+) Dental Advisory Given, Teeth Intact   Pulmonary neg pulmonary ROS, former smoker,    Pulmonary exam normal breath sounds clear to auscultation       Cardiovascular Exercise Tolerance: Good hypertension, Pt. on medications + CAD and + Cardiac Stents  Normal cardiovascular exam Rhythm:regular Rate:Normal     Neuro/Psych negative neurological ROS  negative psych ROS   GI/Hepatic negative GI ROS, Neg liver ROS,   Endo/Other  diabetes, Well Controlled, Type 2, Oral Hypoglycemic Agents  Renal/GU negative Renal ROS  negative genitourinary   Musculoskeletal   Abdominal   Peds  Hematology negative hematology ROS (+)   Anesthesia Other Findings   Reproductive/Obstetrics negative OB ROS                             Anesthesia Physical Anesthesia Plan  ASA: III  Anesthesia Plan: MAC   Post-op Pain Management:    Induction:   Airway Management Planned:   Additional Equipment:   Intra-op Plan:   Post-operative Plan:   Informed Consent: I have reviewed the patients History and Physical, chart, labs and discussed the procedure including the risks, benefits and alternatives for the proposed anesthesia with the patient or authorized representative who has indicated his/her understanding and acceptance.   Dental Advisory Given  Plan Discussed with: CRNA and Surgeon  Anesthesia Plan Comments:         Anesthesia Quick Evaluation

## 2015-06-03 NOTE — Discharge Instructions (Addendum)

## 2015-06-03 NOTE — Interval H&P Note (Signed)
History and Physical Interval Note:  06/03/2015 9:40 AM  Jordan [name]  has presented today for surgery, with the diagnosis of concern for fistula  The various methods of treatment have been discussed with the patient and family. After consideration of risks, benefits and other options for treatment, the patient has consented to  Procedure(s): ANAL EXAM UNDER ANESTHESIA WITH POSSIBLE FISTULOTOMY  ([name]) POSSIBLE PLACEMENT OF SETON ([name]) as a surgical intervention .  The patient's history has been reviewed, patient examined, no change in status, stable for surgery.  I have reviewed the patient's chart and labs.  Questions were answered to the patient's satisfaction.     Rosario Adie, MD  [facility]

## 2015-06-03 NOTE — Transfer of Care (Signed)
Immediate Anesthesia Transfer of Care Note  Patient: Jordan [name]  Procedure(s) Performed: Procedure(s) (LRB): ANAL EXAM UNDER ANESTHESIA ,FISTULOTOMY  ([name])  Patient Location: PACU  Anesthesia Type: MAC  Level of Consciousness: awake, sedated, patient cooperative and responds to stimulation  Airway & Oxygen Therapy: Patient Spontanous Breathing and Patient connected to face mask oxygen  Post-op Assessment: Report given to PACU RN, Post -op Vital signs reviewed and stable and Patient moving all extremities  Post vital signs: Reviewed and stable  Complications: No apparent anesthesia complications

## 2015-06-03 NOTE — H&P (View-Only) (Signed)
Jordan [name] 05/25/2015 11:02 AM Location: [facility] Patient #: [id number] DOB: August 17, 1922 Widowed / Language: [name] / Race: White Male   History of Present Illness [name] MD; 91/10/7827 11:57 AM) Patient words: reck.  The patient is a 81 year old male who presents with fecal incontinence. 81 year old male who presents to the office with fecal urgency. The patient reports almost constant urgency symptoms. He then goes to the bathroom and is only able to expel a small amount of stool. He does endorse draining. He has been on multiple fiber supplements and MiraLAX. He states that these symptoms started approximately 2 years ago. His colonoscopy was normal. He had a CT scan, which showed some perineal descent with defecation and a small rectocele. He denies any fecal incontinence.   Other Problems Davy Pique Bynum, [place]; 05/25/2015 11:03 AM) Back Pain Diabetes Mellitus Enlarged Prostate Hemorrhoids High blood pressure Hypercholesterolemia Inguinal Hernia  Diagnostic Studies History Marjean Donna, CMA; 05/25/2015 11:03 AM) Colonoscopy within last year  Allergies ([place], [place]; 05/25/2015 11:03 AM) No Known Drug Allergies11/02/2015  Medication History (Sonya Bynum, CMA; 05/25/2015 11:05 AM) Aspirin ('81MG'$  Tablet, Oral) Active. Aricept ('10MG'$  Tablet, Oral) Active. Enalapril Maleate ('10MG'$  Tablet, Oral) Active. HydroCHLOROthiazide (12.'5MG'$  Capsule, Oral) Active. MetFORMIN HCl ('500MG'$  Tablet, Oral) Active. Pravastatin Sodium ('80MG'$  Tablet, Oral) Active. Tamsulosin HCl (0.'4MG'$  Capsule, Oral) Active. Benefiber (Oral) Active. Medications Reconciled    Review of Systems (Sonya Bynum CMA; 05/25/2015 11:03 AM) General Not Present- Appetite Loss, Chills, Fatigue, Fever, Night Sweats, Weight Gain and Weight Loss. Skin Present- Dryness. Not Present- Change in Wart/Mole, Hives, Jaundice, New Lesions, Non-Healing Wounds, Rash and Ulcer. HEENT Present-  Hearing Loss, Visual Disturbances and Wears glasses/contact lenses. Not Present- Earache, Hoarseness, Nose Bleed, Oral Ulcers, Ringing in the Ears, Seasonal Allergies, Sinus Pain, Sore Throat and Yellow Eyes. Cardiovascular Present- Leg Cramps. Not Present- Chest Pain, Difficulty Breathing Lying Down, Palpitations, Rapid Heart Rate, Shortness of Breath and Swelling of Extremities. Gastrointestinal Present- Change in Bowel Habits, Constipation, Excessive gas, Hemorrhoids and Rectal Pain. Not Present- Abdominal Pain, Bloating, Bloody Stool, Chronic diarrhea, Difficulty Swallowing, Gets full quickly at meals, Indigestion, Nausea and Vomiting. Male Genitourinary Present- Change in Urinary Stream, Frequency, Impotence, Nocturia, Painful Urination, Urgency and Urine Leakage. Not Present- Blood in Urine. Musculoskeletal Present- Back Pain. Not Present- Joint Pain, Joint Stiffness, Muscle Pain, Muscle Weakness and Swelling of Extremities. Neurological Present- Decreased Memory. Not Present- Fainting, Headaches, Numbness, Seizures, Tingling, Tremor, Trouble walking and Weakness. Endocrine Present- Cold Intolerance. Not Present- Excessive Hunger, Hair Changes, Heat Intolerance, Hot flashes and New Diabetes. Hematology Present- Easy Bruising and Excessive bleeding. Not Present- Gland problems, HIV and Persistent Infections.  Vitals (Sonya Bynum CMA; 05/25/2015 11:03 AM) 05/25/2015 11:03 AM Weight: 141.8 lb Height: 68in Body Surface Area: 1.77 m Body Mass Index: 21.56 kg/m  Temp.: 97.74F(Temporal)  Pulse: 81 (Regular)  BP: 126/72 (Sitting, Left Arm, Standard)       Physical Exam [name] MD; 56/08/1306 11:58 AM) General Mental Status-Alert. General Appearance-Not in acute distress. Build & Nutrition-Well nourished. Posture-Normal posture. Gait-Normal.  Head and Neck Head-normocephalic, atraumatic with no lesions or palpable masses. Trachea-midline.  Chest and Lung  Exam Chest and lung exam reveals -on auscultation, normal breath sounds, no adventitious sounds and normal vocal resonance.  Cardiovascular Cardiovascular examination reveals -normal heart sounds, regular rate and rhythm with no murmurs.  Abdomen Inspection Inspection of the abdomen reveals - No Hernias. Palpation/Percussion Palpation and Percussion of the abdomen reveal - Soft, Non Tender, No Rigidity (guarding),  No hepatosplenomegaly and No Palpable abdominal masses.  Rectal Anorectal Exam External - normal sphincter . Note: Small posterior lesion that expresses purulence. Fistula probe unable to be passed through this. Slightly tender to palpation. Internal - normal sphincter tone, palpable rectal shelf, no fecal impaction.  Neurologic Neurologic evaluation reveals -alert and oriented x 3 with no impairment of recent or remote memory, normal attention span and ability to concentrate, normal sensation and normal coordination.  Musculoskeletal Normal Exam - Bilateral-Upper Extremity Strength Normal and Lower Extremity Strength Normal.   Results [name] MD; 36/07/4429 11:59 AM) Procedures  Name Value Date ANOSCOPY, DIAGNOSTIC (54008) [ Hemorrhoids ] Procedure Other: Procedure: Anoscopy Surgeon: Marcello Moores After the risks and benefits were explained, verbal consent was obtained for above procedure. A medical assistant chaperone was present thoroughout the entire procedure. Anesthesia: none Diagnosis: Fecal incontinence Findings: Normal sphincter tone, moderate squeeze, appropriate for age. Slight rectocele palpated, perineal bulging noted. Lesion noted at posterior midline that expresses purulence. Fistula probe unable to be passed through this. Internally the patient has redundant mucosa without any overt signs of prolapse. There are no hemorrhoid columns noted internally. There is soft stool in the rectal vault. The patient is not impacted.  Performed: 05/25/2015  11:58 AM    Assessment & Plan [name] MD; 67/12/1948 12:01 PM) DEFECATION URGENCY (R15.2) Impression: 81 year old male who presents to the office with significant urgency. On exam he does have indications of some pelvic outlet defecation problems. He also has a posterior midline lesion that is concerning for possible fistula. I have recommended an exam under anesthesia. If he does have a fistula, this may account for some of his urgency issues. If the exam is negative, we will proceed with MR defacography.

## 2015-06-03 NOTE — Op Note (Signed)
06/03/2015  10:15 AM  PATIENT:  Jordan [name]  81 y.o. male  Patient Care Team: Asencion Noble, MD as PCP - General (Internal Medicine) Daneil Dolin, MD as Attending Physician (Gastroenterology)  PRE-OPERATIVE DIAGNOSIS:  concern for fistula  POST-OPERATIVE DIAGNOSIS:  Posterior fistula  PROCEDURE:  ANAL EXAM UNDER ANESTHESIA ,FISTULOTOMY   SURGEON:  Surgeon(s): [name], MD  ASSISTANT: none   ANESTHESIA:   local and MAC  SPECIMEN:  No Specimen  DISPOSITION OF SPECIMEN:  [name]  COUNTS:  YES  PLAN OF CARE: Discharge to home after PACU  PATIENT DISPOSITION:  PACU - hemodynamically stable.  INDICATION: 81 year-old male with chronic urgency. Concern for fistula was noted on office examination.    OR FINDINGS: Posterior anal fistula  DESCRIPTION: the patient was identified in the preoperative holding area and taken to the OR where they were laid on the operating room table.  MAC  anesthesia was induced without difficulty. The patient was then positioned in prone jackknife position with buttocks gently taped apart.  The patient was then prepped and draped in usual sterile fashion.  SCDs were noted to be in place prior to the initiation of anesthesia. A surgical timeout was performed indicating the correct patient, procedure, positioning and need for preoperative antibiotics.  A rectal block was performed using Marcaine with epinephrine.    I began with a digital rectal exam.  There were no abnormal masses noted except for a posterior perianal nodule.  I then placed a Hill-Ferguson anoscope into the anal canal and evaluated this completely.  The patient had no signs of internal inflammation. There was sphincter hypertension present.  There were grade 1 internal hemorrhoids. No rectocele could be palpated.  There was an age appropriate amount of laxity within the pelvic wall.  I placed an S-shaped fistula probe through the area of concern at posterior midline. This easily passed  into the anal canal. A fistulotomy was performed. The edges were marsupialized with a 3-0 chromic suture. No muscle was incised. Hemostasis was good. Lidocaine ointment and sterile dressing were applied. The patient was awakened from anesthesia and sent to the postanesthesia care unit in stable condition. All counts were correct per operating room staff.

## 2015-06-03 NOTE — Anesthesia Postprocedure Evaluation (Signed)
  Anesthesia Post-op Note  Patient: Jordan [name]  Procedure(s) Performed: Procedure(s) (LRB): ANAL EXAM UNDER ANESTHESIA ,FISTULOTOMY  ([name])  Patient Location: PACU  Anesthesia Type: MAC  Level of Consciousness: awake and alert   Airway and Oxygen Therapy: Patient Spontanous Breathing  Post-op Pain: mild  Post-op Assessment: Post-op Vital signs reviewed, Patient's Cardiovascular Status Stable, Respiratory Function Stable, Patent Airway and No signs of Nausea or vomiting  Last Vitals:  Filed Vitals:   06/03/15 1140  BP: 140/52  Pulse: 57  Temp:   Resp: 16    Post-op Vital Signs: stable   Complications: No apparent anesthesia complications

## 2015-06-04 ENCOUNTER — Encounter (HOSPITAL_BASED_OUTPATIENT_CLINIC_OR_DEPARTMENT_OTHER): Payer: Self-pay | Admitting: General Surgery

## 2015-06-04 NOTE — Addendum Note (Signed)
Addendum  created 06/04/15 1819 by Rod Mae, MD   Modules edited: Anesthesia Responsible Staff

## 2015-06-07 ENCOUNTER — Encounter (HOSPITAL_BASED_OUTPATIENT_CLINIC_OR_DEPARTMENT_OTHER): Payer: Self-pay | Admitting: General Surgery

## 2015-08-13 ENCOUNTER — Ambulatory Visit (INDEPENDENT_AMBULATORY_CARE_PROVIDER_SITE_OTHER): Payer: Medicare Other | Admitting: Urology

## 2015-08-13 DIAGNOSIS — N401 Enlarged prostate with lower urinary tract symptoms: Secondary | ICD-10-CM

## 2015-08-13 DIAGNOSIS — R351 Nocturia: Secondary | ICD-10-CM

## 2015-08-13 DIAGNOSIS — N3941 Urge incontinence: Secondary | ICD-10-CM

## 2015-08-31 DIAGNOSIS — E119 Type 2 diabetes mellitus without complications: Secondary | ICD-10-CM | POA: Diagnosis not present

## 2015-09-10 ENCOUNTER — Ambulatory Visit (INDEPENDENT_AMBULATORY_CARE_PROVIDER_SITE_OTHER): Payer: Medicare Other | Admitting: Urology

## 2015-09-10 DIAGNOSIS — R351 Nocturia: Secondary | ICD-10-CM | POA: Diagnosis not present

## 2015-09-10 DIAGNOSIS — N3941 Urge incontinence: Secondary | ICD-10-CM | POA: Diagnosis not present

## 2015-09-10 DIAGNOSIS — N138 Other obstructive and reflux uropathy: Secondary | ICD-10-CM

## 2015-09-10 DIAGNOSIS — N401 Enlarged prostate with lower urinary tract symptoms: Secondary | ICD-10-CM

## 2015-09-27 DIAGNOSIS — H40013 Open angle with borderline findings, low risk, bilateral: Secondary | ICD-10-CM | POA: Diagnosis not present

## 2015-09-27 DIAGNOSIS — H04123 Dry eye syndrome of bilateral lacrimal glands: Secondary | ICD-10-CM | POA: Diagnosis not present

## 2015-09-29 DIAGNOSIS — M25561 Pain in right knee: Secondary | ICD-10-CM | POA: Diagnosis not present

## 2015-09-29 DIAGNOSIS — M2242 Chondromalacia patellae, left knee: Secondary | ICD-10-CM | POA: Diagnosis not present

## 2015-09-29 DIAGNOSIS — M2392 Unspecified internal derangement of left knee: Secondary | ICD-10-CM | POA: Diagnosis not present

## 2015-09-29 DIAGNOSIS — M25562 Pain in left knee: Secondary | ICD-10-CM | POA: Diagnosis not present

## 2015-10-01 DIAGNOSIS — E119 Type 2 diabetes mellitus without complications: Secondary | ICD-10-CM | POA: Diagnosis not present

## 2015-10-28 DIAGNOSIS — M25562 Pain in left knee: Secondary | ICD-10-CM | POA: Diagnosis not present

## 2015-10-28 DIAGNOSIS — M2242 Chondromalacia patellae, left knee: Secondary | ICD-10-CM | POA: Diagnosis not present

## 2015-11-02 DIAGNOSIS — H26491 Other secondary cataract, right eye: Secondary | ICD-10-CM | POA: Diagnosis not present

## 2015-11-04 DIAGNOSIS — E119 Type 2 diabetes mellitus without complications: Secondary | ICD-10-CM | POA: Diagnosis not present

## 2015-11-04 DIAGNOSIS — I10 Essential (primary) hypertension: Secondary | ICD-10-CM | POA: Diagnosis not present

## 2015-11-05 ENCOUNTER — Ambulatory Visit (INDEPENDENT_AMBULATORY_CARE_PROVIDER_SITE_OTHER): Payer: Medicare Other | Admitting: Urology

## 2015-11-05 DIAGNOSIS — R351 Nocturia: Secondary | ICD-10-CM

## 2015-11-05 DIAGNOSIS — N3941 Urge incontinence: Secondary | ICD-10-CM | POA: Diagnosis not present

## 2015-11-05 DIAGNOSIS — N401 Enlarged prostate with lower urinary tract symptoms: Secondary | ICD-10-CM

## 2015-11-17 DIAGNOSIS — H43821 Vitreomacular adhesion, right eye: Secondary | ICD-10-CM | POA: Diagnosis not present

## 2015-11-17 DIAGNOSIS — H353290 Exudative age-related macular degeneration, unspecified eye, stage unspecified: Secondary | ICD-10-CM | POA: Diagnosis not present

## 2015-11-17 DIAGNOSIS — H35373 Puckering of macula, bilateral: Secondary | ICD-10-CM | POA: Diagnosis not present

## 2015-11-17 DIAGNOSIS — H35342 Macular cyst, hole, or pseudohole, left eye: Secondary | ICD-10-CM | POA: Diagnosis not present

## 2015-11-17 DIAGNOSIS — H4322 Crystalline deposits in vitreous body, left eye: Secondary | ICD-10-CM | POA: Diagnosis not present

## 2015-11-17 DIAGNOSIS — H353190 Nonexudative age-related macular degeneration, unspecified eye, stage unspecified: Secondary | ICD-10-CM | POA: Diagnosis not present

## 2016-02-04 ENCOUNTER — Ambulatory Visit (INDEPENDENT_AMBULATORY_CARE_PROVIDER_SITE_OTHER): Payer: Medicare Other | Admitting: Urology

## 2016-02-04 DIAGNOSIS — N401 Enlarged prostate with lower urinary tract symptoms: Secondary | ICD-10-CM | POA: Diagnosis not present

## 2016-02-04 DIAGNOSIS — R351 Nocturia: Secondary | ICD-10-CM

## 2016-02-04 DIAGNOSIS — N3941 Urge incontinence: Secondary | ICD-10-CM | POA: Diagnosis not present

## 2016-03-15 DIAGNOSIS — H26491 Other secondary cataract, right eye: Secondary | ICD-10-CM | POA: Diagnosis not present

## 2016-03-15 DIAGNOSIS — H353190 Nonexudative age-related macular degeneration, unspecified eye, stage unspecified: Secondary | ICD-10-CM | POA: Diagnosis not present

## 2016-03-15 DIAGNOSIS — H43821 Vitreomacular adhesion, right eye: Secondary | ICD-10-CM | POA: Diagnosis not present

## 2016-03-15 DIAGNOSIS — H04123 Dry eye syndrome of bilateral lacrimal glands: Secondary | ICD-10-CM | POA: Diagnosis not present

## 2016-03-15 DIAGNOSIS — H4322 Crystalline deposits in vitreous body, left eye: Secondary | ICD-10-CM | POA: Diagnosis not present

## 2016-03-15 DIAGNOSIS — H40013 Open angle with borderline findings, low risk, bilateral: Secondary | ICD-10-CM | POA: Diagnosis not present

## 2016-03-15 DIAGNOSIS — H35373 Puckering of macula, bilateral: Secondary | ICD-10-CM | POA: Diagnosis not present

## 2016-03-15 DIAGNOSIS — H35342 Macular cyst, hole, or pseudohole, left eye: Secondary | ICD-10-CM | POA: Diagnosis not present

## 2016-03-15 DIAGNOSIS — H353290 Exudative age-related macular degeneration, unspecified eye, stage unspecified: Secondary | ICD-10-CM | POA: Diagnosis not present

## 2016-03-17 ENCOUNTER — Ambulatory Visit (INDEPENDENT_AMBULATORY_CARE_PROVIDER_SITE_OTHER): Payer: Medicare Other | Admitting: Urology

## 2016-03-17 DIAGNOSIS — N3941 Urge incontinence: Secondary | ICD-10-CM

## 2016-03-17 DIAGNOSIS — N401 Enlarged prostate with lower urinary tract symptoms: Secondary | ICD-10-CM

## 2016-03-22 DIAGNOSIS — H6122 Impacted cerumen, left ear: Secondary | ICD-10-CM | POA: Diagnosis not present

## 2016-03-22 DIAGNOSIS — E119 Type 2 diabetes mellitus without complications: Secondary | ICD-10-CM | POA: Diagnosis not present

## 2016-03-24 DIAGNOSIS — I350 Nonrheumatic aortic (valve) stenosis: Secondary | ICD-10-CM | POA: Diagnosis not present

## 2016-03-24 DIAGNOSIS — R413 Other amnesia: Secondary | ICD-10-CM | POA: Diagnosis not present

## 2016-03-24 DIAGNOSIS — E119 Type 2 diabetes mellitus without complications: Secondary | ICD-10-CM | POA: Diagnosis not present

## 2016-03-28 ENCOUNTER — Other Ambulatory Visit (HOSPITAL_COMMUNITY): Payer: Self-pay | Admitting: Internal Medicine

## 2016-03-28 DIAGNOSIS — I358 Other nonrheumatic aortic valve disorders: Secondary | ICD-10-CM

## 2016-04-06 ENCOUNTER — Other Ambulatory Visit (HOSPITAL_COMMUNITY): Payer: Medicare Other

## 2016-04-06 DIAGNOSIS — E119 Type 2 diabetes mellitus without complications: Secondary | ICD-10-CM | POA: Diagnosis not present

## 2016-04-06 DIAGNOSIS — H26491 Other secondary cataract, right eye: Secondary | ICD-10-CM | POA: Diagnosis not present

## 2016-04-06 DIAGNOSIS — Z961 Presence of intraocular lens: Secondary | ICD-10-CM | POA: Diagnosis not present

## 2016-04-06 DIAGNOSIS — Z87891 Personal history of nicotine dependence: Secondary | ICD-10-CM | POA: Diagnosis not present

## 2016-04-06 DIAGNOSIS — H43391 Other vitreous opacities, right eye: Secondary | ICD-10-CM | POA: Diagnosis not present

## 2016-04-06 DIAGNOSIS — H4389 Other disorders of vitreous body: Secondary | ICD-10-CM | POA: Diagnosis not present

## 2016-04-06 DIAGNOSIS — T85898A Other specified complication of other internal prosthetic devices, implants and grafts, initial encounter: Secondary | ICD-10-CM | POA: Diagnosis not present

## 2016-04-17 ENCOUNTER — Ambulatory Visit (HOSPITAL_COMMUNITY)
Admission: RE | Admit: 2016-04-17 | Discharge: 2016-04-17 | Disposition: A | Discharge status: Home / Self Care | Payer: Medicare Other | Source: Ambulatory Visit | Attending: Internal Medicine | Admitting: Internal Medicine

## 2016-04-17 DIAGNOSIS — I082 Rheumatic disorders of both aortic and tricuspid valves: Secondary | ICD-10-CM | POA: Insufficient documentation

## 2016-04-17 DIAGNOSIS — R011 Cardiac murmur, unspecified: Secondary | ICD-10-CM | POA: Insufficient documentation

## 2016-04-17 DIAGNOSIS — I358 Other nonrheumatic aortic valve disorders: Secondary | ICD-10-CM

## 2016-04-17 NOTE — Progress Notes (Signed)
*  PRELIMINARY RESULTS* Echocardiogram 2D Echocardiogram has been performed.  Jordan [name] 04/17/2016, 11:35 AM

## 2016-04-20 DIAGNOSIS — Z23 Encounter for immunization: Secondary | ICD-10-CM | POA: Diagnosis not present

## 2016-04-25 DIAGNOSIS — R152 Fecal urgency: Secondary | ICD-10-CM | POA: Diagnosis not present

## 2016-05-24 DIAGNOSIS — H43821 Vitreomacular adhesion, right eye: Secondary | ICD-10-CM | POA: Diagnosis not present

## 2016-05-24 DIAGNOSIS — H35373 Puckering of macula, bilateral: Secondary | ICD-10-CM | POA: Diagnosis not present

## 2016-05-24 DIAGNOSIS — H353290 Exudative age-related macular degeneration, unspecified eye, stage unspecified: Secondary | ICD-10-CM | POA: Diagnosis not present

## 2016-05-24 DIAGNOSIS — H353190 Nonexudative age-related macular degeneration, unspecified eye, stage unspecified: Secondary | ICD-10-CM | POA: Diagnosis not present

## 2016-06-13 ENCOUNTER — Emergency Department (HOSPITAL_COMMUNITY): Payer: Medicare Other

## 2016-06-13 ENCOUNTER — Encounter (HOSPITAL_COMMUNITY): Payer: Self-pay | Admitting: Emergency Medicine

## 2016-06-13 ENCOUNTER — Other Ambulatory Visit: Payer: Self-pay

## 2016-06-13 ENCOUNTER — Inpatient Hospital Stay (HOSPITAL_COMMUNITY)
Admission: EM | Admit: 2016-06-13 | Discharge: 2016-06-20 | DRG: 439 | Disposition: A | Discharge status: Home / Self Care | Payer: Medicare Other | Attending: Internal Medicine | Admitting: Internal Medicine

## 2016-06-13 DIAGNOSIS — Z961 Presence of intraocular lens: Secondary | ICD-10-CM | POA: Diagnosis present

## 2016-06-13 DIAGNOSIS — Z9842 Cataract extraction status, left eye: Secondary | ICD-10-CM

## 2016-06-13 DIAGNOSIS — K859 Acute pancreatitis without necrosis or infection, unspecified: Secondary | ICD-10-CM | POA: Diagnosis not present

## 2016-06-13 DIAGNOSIS — I350 Nonrheumatic aortic (valve) stenosis: Secondary | ICD-10-CM | POA: Diagnosis not present

## 2016-06-13 DIAGNOSIS — I2510 Atherosclerotic heart disease of native coronary artery without angina pectoris: Secondary | ICD-10-CM | POA: Diagnosis not present

## 2016-06-13 DIAGNOSIS — R1013 Epigastric pain: Secondary | ICD-10-CM | POA: Diagnosis not present

## 2016-06-13 DIAGNOSIS — K830 Cholangitis: Secondary | ICD-10-CM | POA: Diagnosis not present

## 2016-06-13 DIAGNOSIS — Z7982 Long term (current) use of aspirin: Secondary | ICD-10-CM | POA: Diagnosis not present

## 2016-06-13 DIAGNOSIS — K8500 Idiopathic acute pancreatitis without necrosis or infection: Secondary | ICD-10-CM | POA: Diagnosis not present

## 2016-06-13 DIAGNOSIS — K5712 Diverticulitis of small intestine without perforation or abscess without bleeding: Secondary | ICD-10-CM | POA: Diagnosis present

## 2016-06-13 DIAGNOSIS — K5730 Diverticulosis of large intestine without perforation or abscess without bleeding: Secondary | ICD-10-CM | POA: Diagnosis not present

## 2016-06-13 DIAGNOSIS — R0602 Shortness of breath: Secondary | ICD-10-CM | POA: Diagnosis not present

## 2016-06-13 DIAGNOSIS — N400 Enlarged prostate without lower urinary tract symptoms: Secondary | ICD-10-CM | POA: Diagnosis not present

## 2016-06-13 DIAGNOSIS — Z9841 Cataract extraction status, right eye: Secondary | ICD-10-CM

## 2016-06-13 DIAGNOSIS — I10 Essential (primary) hypertension: Secondary | ICD-10-CM | POA: Diagnosis present

## 2016-06-13 DIAGNOSIS — Z955 Presence of coronary angioplasty implant and graft: Secondary | ICD-10-CM

## 2016-06-13 DIAGNOSIS — E119 Type 2 diabetes mellitus without complications: Secondary | ICD-10-CM | POA: Diagnosis not present

## 2016-06-13 DIAGNOSIS — K851 Biliary acute pancreatitis without necrosis or infection: Secondary | ICD-10-CM | POA: Diagnosis not present

## 2016-06-13 DIAGNOSIS — E785 Hyperlipidemia, unspecified: Secondary | ICD-10-CM | POA: Diagnosis present

## 2016-06-13 DIAGNOSIS — Z7984 Long term (current) use of oral hypoglycemic drugs: Secondary | ICD-10-CM

## 2016-06-13 DIAGNOSIS — K5732 Diverticulitis of large intestine without perforation or abscess without bleeding: Secondary | ICD-10-CM | POA: Diagnosis not present

## 2016-06-13 DIAGNOSIS — E118 Type 2 diabetes mellitus with unspecified complications: Secondary | ICD-10-CM | POA: Diagnosis not present

## 2016-06-13 DIAGNOSIS — Z87891 Personal history of nicotine dependence: Secondary | ICD-10-CM

## 2016-06-13 LAB — CBC WITH DIFFERENTIAL/PLATELET
BASOS ABS: 0 10*3/uL (ref 0.0–0.1)
Basophils Relative: 0 %
EOS PCT: 1 %
Eosinophils Absolute: 0.2 10*3/uL (ref 0.0–0.7)
HEMATOCRIT: 40 % (ref 39.0–52.0)
Hemoglobin: 12.9 g/dL — ABNORMAL LOW (ref 13.0–17.0)
LYMPHS PCT: 15 %
Lymphs Abs: 1.9 10*3/uL (ref 0.7–4.0)
MCH: 29.3 pg (ref 26.0–34.0)
MCHC: 32.3 g/dL (ref 30.0–36.0)
MCV: 90.7 fL (ref 78.0–100.0)
MONO ABS: 0.8 10*3/uL (ref 0.1–1.0)
MONOS PCT: 6 %
Neutro Abs: 10 10*3/uL — ABNORMAL HIGH (ref 1.7–7.7)
Neutrophils Relative %: 78 %
PLATELETS: 336 10*3/uL (ref 150–400)
RBC: 4.41 MIL/uL (ref 4.22–5.81)
RDW: 12.7 % (ref 11.5–15.5)
WBC: 12.9 10*3/uL — ABNORMAL HIGH (ref 4.0–10.5)

## 2016-06-13 LAB — I-STAT TROPONIN, ED: TROPONIN I, POC: 0 ng/mL (ref 0.00–0.08)

## 2016-06-13 LAB — COMPREHENSIVE METABOLIC PANEL
ALT: 78 U/L — ABNORMAL HIGH (ref 17–63)
ANION GAP: 9 (ref 5–15)
AST: 219 U/L — AB (ref 15–41)
Albumin: 3.6 g/dL (ref 3.5–5.0)
Alkaline Phosphatase: 91 U/L (ref 38–126)
BILIRUBIN TOTAL: 0.7 mg/dL (ref 0.3–1.2)
BUN: 24 mg/dL — ABNORMAL HIGH (ref 6–20)
CHLORIDE: 98 mmol/L — AB (ref 101–111)
CO2: 28 mmol/L (ref 22–32)
Calcium: 8.9 mg/dL (ref 8.9–10.3)
Creatinine, Ser: 1.01 mg/dL (ref 0.61–1.24)
Glucose, Bld: 157 mg/dL — ABNORMAL HIGH (ref 65–99)
POTASSIUM: 4.5 mmol/L (ref 3.5–5.1)
Sodium: 135 mmol/L (ref 135–145)
TOTAL PROTEIN: 6.5 g/dL (ref 6.5–8.1)

## 2016-06-13 LAB — URINALYSIS, ROUTINE W REFLEX MICROSCOPIC
Bilirubin Urine: NEGATIVE
GLUCOSE, UA: NEGATIVE mg/dL
Hgb urine dipstick: NEGATIVE
KETONES UR: 15 mg/dL — AB
LEUKOCYTES UA: NEGATIVE
NITRITE: NEGATIVE
PROTEIN: NEGATIVE mg/dL
Specific Gravity, Urine: 1.02 (ref 1.005–1.030)
pH: 5.5 (ref 5.0–8.0)

## 2016-06-13 LAB — PROTIME-INR
INR: 0.95
PROTHROMBIN TIME: 12.7 s (ref 11.4–15.2)

## 2016-06-13 LAB — LIPASE, BLOOD: LIPASE: 674 U/L — AB (ref 11–51)

## 2016-06-13 MED ORDER — IOPAMIDOL (ISOVUE-370) INJECTION 76%
100.0000 mL | Freq: Once | INTRAVENOUS | Status: AC | PRN
  Administered 2016-06-13: 100 mL via INTRAVENOUS

## 2016-06-13 MED ORDER — MORPHINE SULFATE (PF) 2 MG/ML IV SOLN
2.0000 mg | Freq: Once | INTRAVENOUS | Status: AC
  Administered 2016-06-13: 2 mg via INTRAVENOUS
  Filled 2016-06-13: qty 1

## 2016-06-13 MED ORDER — MORPHINE SULFATE (PF) 2 MG/ML IV SOLN
2.0000 mg | Freq: Once | INTRAVENOUS | Status: AC
  Administered 2016-06-14: 2 mg via INTRAVENOUS
  Filled 2016-06-13: qty 1

## 2016-06-13 MED ORDER — ONDANSETRON HCL 4 MG/2ML IJ SOLN
4.0000 mg | Freq: Once | INTRAMUSCULAR | Status: AC
  Administered 2016-06-13: 4 mg via INTRAVENOUS
  Filled 2016-06-13: qty 2

## 2016-06-13 NOTE — ED Notes (Signed)
Pt reports epigastric pain and nausea since 5pm today.  Reports generalized weakness.  Pt says had 1 episode of diarrhea pta and vomited upon entering the er room.  Pt alert and oriented.  Denies chest pain or sob.

## 2016-06-13 NOTE — ED Provider Notes (Signed)
[facility] DEPT Provider Note   CSN: [id number] Arrival date & time: 06/13/16  1803     History   Chief Complaint Chief Complaint  Patient presents with  . Chest Pain    HPI KAVEN CUMBIE is a 82 y.o. male.  HPI  82 year old male history of coronary artery disease presents today with sudden onset of epigastric pain that began approximately one hour before arrival. He describes it as severe with associated nausea and vomiting. He has not had any similar pain in the past. It is in the upper abdomen with some radiation up to his chest and through to his back. He denies fever, chills, or urinary infection symptoms. He has not had cough or dyspnea.  Past Medical History:  Diagnosis Date  . Arthritis   . BPH (benign prostatic hypertrophy)   . CAD (coronary artery disease) no cardiologist--  sees pcp  dr fagan   s/p  PCI and stenting pRCA 2009  . Diverticulosis of colon   . Fecal urgency   . History of adenomatous polyp of colon    tubular adenoma's 05/ 2016  . Hyperlipidemia   . Hypertension   . Lower urinary tract symptoms (LUTS)   . S/P drug eluting coronary stent placement    12-23-2007  pRCA  . Type 2 diabetes mellitus ([place])   . Wears glasses     Patient Active Problem List   Diagnosis Date Noted  . History of colonic polyps   . Diverticulosis of colon without hemorrhage   . Hemorrhoids 10/23/2014  . Constipation 06/03/2012  . Balance disorder 05/29/2012    Past Surgical History:  Procedure Laterality Date  . CATARACT EXTRACTION W/ INTRAOCULAR LENS  IMPLANT, BILATERAL    . COLONOSCOPY  last one 11/16/2014   polpectomy  . CORONARY ANGIOPLASTY WITH STENT PLACEMENT  12-23-2007  dr hochrein   (abnormal myoview)  PCI w/ DES x1 pRCA (95%),  pCFX 25%,  mCFX 40-50%,  after PDA 30%,  prox, mid, and ostial LAD  30% calcification,  LM diffuse 25% calcification,  ef 60%  . EVALUATION UNDER ANESTHESIA WITH ANAL FISTULECTOMY [name] 06/03/2015   Procedure: ANAL EXAM UNDER  ANESTHESIA ,FISTULOTOMY ;  Surgeon: [name], MD;  Location: [facility];  Service: General;  Laterality: [name];  . INGUINAL HERNIA REPAIR Right 2001  . TRANSTHORACIC ECHOCARDIOGRAM  12-06-2007   normal LVF, ef 60%/  mild to moderate  AV calcification without stenosis,  mild AR and TR,  mild LAE       Home Medications    Prior to Admission medications   Medication Sig Start Date End Date Taking? Authorizing Provider  tamsulosin (FLOMAX) 0.4 MG CAPS capsule  02/26/09  Yes Historical Provider, MD  aspirin 81 MG tablet Take 81 mg by mouth daily.    Historical Provider, MD  donepezil (ARICEPT) 10 MG tablet Take 10 mg by mouth at bedtime.     Historical Provider, MD  enalapril (VASOTEC) 10 MG tablet Take 10 mg by mouth daily.    Historical Provider, MD  hydrochlorothiazide (MICROZIDE) 12.5 MG capsule Take 12.5 mg by mouth daily.    Historical Provider, MD  HYDROcodone-acetaminophen (NORCO/VICODIN) 5-325 MG tablet Take 1 tablet by mouth every 4 (four) hours as needed. Patient taking differently: Take 1 tablet by mouth every 4 (four) hours as needed for moderate pain.  54/00/86   [name], MD  memantine (NAMENDA) 5 MG tablet Take 5 mg by mouth 2 (two) times daily.  06/02/16  Historical Provider, MD  metFORMIN (GLUCOPHAGE) 500 MG tablet Take 500 mg by mouth 2 (two) times daily with a meal.    Historical Provider, MD  polyethylene glycol powder (GLYCOLAX/MIRALAX) powder Take 17 g by mouth daily. For a bowel movement Patient taking differently: Take 17 g by mouth as needed. For a bowel movement 07/02/12   Annitta Needs, NP  pravastatin (PRAVACHOL) 80 MG tablet Take 80 mg by mouth daily.    Historical Provider, MD  Wheat Dextrin (BENEFIBER PO) Take by mouth daily.    Historical Provider, MD    Family History Family History  Problem Relation Age of Onset  . Colon cancer Neg Hx     Social History Social History  Substance Use Topics  . Smoking status: Former Smoker     Years: 30.00    Types: Cigarettes    Quit date: 06/01/1983  . Smokeless tobacco: Never Used  . Alcohol use No     Allergies   Patient has no known allergies.   Review of Systems Review of Systems  Constitutional: Negative.  Negative for activity change.  HENT: Negative.   Eyes: Negative.   Respiratory: Negative.   Cardiovascular: Negative.   Gastrointestinal: Positive for abdominal pain.  Endocrine: Negative.   Genitourinary: Negative.   Musculoskeletal: Negative.   Skin: Negative.   Allergic/Immunologic: Negative.   Neurological: Negative.   Hematological: Negative.   Psychiatric/Behavioral: Negative.   All other systems reviewed and are negative.    Physical Exam Updated Vital Signs BP 157/55   Pulse (!) 58   Temp 97.6 F (36.4 C) (Oral)   Resp 21   Ht '5\' 7"'$  (1.702 m)   Wt 70.3 kg   SpO2 98%   BMI 24.28 kg/m   Physical Exam  Constitutional: He is oriented to person, place, and time. He appears well-developed and well-nourished.  HENT:  Head: Normocephalic and atraumatic.  Right Ear: External ear normal.  Left Ear: External ear normal.  Nose: Nose normal.  Mouth/Throat: Oropharynx is clear and moist.  Eyes: EOM are normal. Pupils are equal, round, and reactive to light.  Neck: Normal range of motion. Neck supple.  Abdominal: There is tenderness.    Musculoskeletal: Normal range of motion.  Neurological: He is alert and oriented to person, place, and time.  Skin: Skin is warm. Capillary refill takes less than 2 seconds.  Vitals reviewed.    ED Treatments / Results  Labs (all labs ordered are listed, but only abnormal results are displayed) Labs Reviewed  CBC WITH DIFFERENTIAL/PLATELET - Abnormal; Notable for the following:       Result Value   WBC 12.9 (*)    Hemoglobin 12.9 (*)    Neutro Abs 10.0 (*)    All other components within normal limits  COMPREHENSIVE METABOLIC PANEL - Abnormal; Notable for the following:    Chloride 98 (*)     Glucose, Bld 157 (*)    BUN 24 (*)    AST 219 (*)    ALT 78 (*)    All other components within normal limits  LIPASE, BLOOD - Abnormal; Notable for the following:    Lipase 674 (*)    All other components within normal limits  PROTIME-INR  URINALYSIS, ROUTINE W REFLEX MICROSCOPIC (NOT AT [facility])  I-STAT TROPOININ, ED    EKG  EKG Interpretation None       Radiology Ct Angio Chest/abd/pel For Dissection W And/or Wo Contrast  Result Date: 06/13/2016 CLINICAL DATA:  Acute onset  of epigastric abdominal pain and nausea. Generalized weakness. Diarrhea and vomiting. Initial encounter. EXAM: CT ANGIOGRAPHY CHEST, ABDOMEN AND PELVIS TECHNIQUE: Multidetector CT imaging through the chest, abdomen and pelvis was performed using the standard protocol during bolus administration of intravenous contrast. Multiplanar reconstructed images and MIPs were obtained and reviewed to evaluate the vascular anatomy. CONTRAST:  100 mL of Isovue 370 IV contrast COMPARISON:  CT of the abdomen and pelvis from 07/05/2012 FINDINGS: CTA CHEST FINDINGS Cardiovascular: There is no evidence of aortic dissection. There is no evidence of aneurysmal dilatation along the thoracic aorta. Scattered calcification is seen along the thoracic aorta and proximal great vessels. The great vessels are otherwise unremarkable in appearance. There is no evidence of significant pulmonary embolus. Diffuse coronary artery calcifications are seen. The heart is otherwise unremarkable. Mediastinum/Nodes: The heart is otherwise unremarkable in appearance. No mediastinal lymphadenopathy is seen. No pericardial effusion is identified. The thyroid gland is unremarkable. No axillary lymphadenopathy is appreciated. Lungs/Pleura: A vague focal 2.1 cm hazy opacity is noted at the left lung apex (image 18 of 58). Mild bibasilar atelectasis is noted. No pleural effusion or pneumothorax is seen. Musculoskeletal: No acute osseous abnormalities are identified. The  visualized musculature is unremarkable in appearance. Review of the MIP images confirms the above findings. CTA ABDOMEN AND PELVIS FINDINGS VASCULAR Aorta: There is aneurysmal dilatation of the abdominal aorta at the level of the renal arteries to 3.2 cm in AP dimension, with an apparent focal right posterior outpouching raising concern for underlying penetrating aortic ulceration at this level. However, this appears relatively stable from 2013. Inferior to the level of the renal arteries, there is dilatation of the abdominal aorta to 3.6 cm in AP and transverse dimensions, which resolves just proximal to the level of the aortic bifurcation. This appears relatively stable from 2013. Underlying diffuse calcification is seen along the abdominal aorta, with minimal associated mural thrombus but no significant luminal narrowing. There is no evidence of aortic dissection. Celiac: There is focal kinking of the proximal celiac trunk, likely secondary to underlying mural thrombus, resulting in moderate to severe focal luminal narrowing. SMA: The superior mesenteric artery appears fully patent, with minimal calcification. Renals: Two renal arteries are noted on each side. These all appear patent, with likely mild luminal narrowing at the origins of both left renal arteries. IMA: The inferior mesenteric artery remains patent. Inflow: Diffuse calcification is seen along the common, internal and external iliac arteries, without significant luminal narrowing. Scattered calcification is seen along the common femoral arteries bilaterally, extending into the superficial femoral arteries. Veins: The visualized venous structures are unremarkable. The inferior vena cava is within normal limits. Review of the MIP images confirms the above findings. NON-VASCULAR Hepatobiliary: The liver is unremarkable in appearance. Trace fluid tracks about the gallbladder. This is thought be secondary to the process identified at the duodenum, given  that the majority of fluid and inflammation is centered about the duodenum pancreatic head. The gallbladder is otherwise grossly unremarkable. The common bile duct remains normal in caliber. Pancreas: Soft tissue inflammation and fluid is noted about the pancreatic head. This could reflect acute pancreatitis. However, the inflammation appears to be centered about the duodenum. Would correlate with the patient's lipase. There is no evidence for devascularization or pseudocyst formation at this time. The remainder of the pancreas is unremarkable. Spleen: The spleen is diminutive and unremarkable in appearance. Adrenals/Urinary Tract: The adrenal glands are unremarkable. A 2.1 cm cyst is noted at the interpole region of the left  kidney. Nonspecific perinephric stranding is noted bilaterally. There is no evidence of hydronephrosis. No renal or ureteral stones are identified. Stomach/Bowel: There are at least three large duodenal diverticula noted tracking about the pancreatic head, the largest of which measures greater than 4 cm and is filled with solid material, with diffuse surrounding soft tissue inflammation and fluid. Fluid tracks superiorly and inferiorly along the duodenum, and anterior to Gerota's fascia on the right. Inflammation also tracks along the mesenteric vasculature. This raises concern for underlying duodenal ulceration, possibly related to the largest diverticulum. There is no definite evidence for bowel perforation at this time. The appendix is normal in caliber, without evidence of appendicitis. Diffuse diverticulosis is noted along the entirety of the colon, without evidence of diverticulitis. Remaining small bowel loops are unremarkable in appearance. The stomach is unremarkable in appearance. Lymphatic: No retroperitoneal or pelvic sidewall lymphadenopathy is seen. Reproductive: The bladder is mildly distended and grossly unremarkable. The prostate is enlarged, measuring 5.5 cm in transverse  dimension, with minimal calcification. This is relatively stable from 2013. Other: A small left inguinal hernia is noted, containing only fat. Musculoskeletal: Mild vacuum phenomenon is noted along the lumbar spine, with underlying facet disease. Anterior bridging osteophytes are seen along the thoracic spine. No acute osseous abnormalities are identified. The visualized musculature is unremarkable in appearance. Review of the MIP images confirms the above findings. IMPRESSION: 1. No evidence of aortic dissection. 2. Aneurysmal dilatation of the abdominal aorta at the level of the renal arteries to 3.2 cm in AP dimension, with an apparent focal right posterior outpouching raising concern for underlying penetrating aortic ulceration at this level. However, this appears relatively stable from 2013. Follow-up could be considered as deemed clinically appropriate. 3. **An incidental finding of potential clinical significance has been found. Aneurysmal dilatation of the infrarenal abdominal aorta to 3.6 cm in AP and transverse dimensions, which resolves just proximal to the aortic bifurcation. This appears relatively stable from 2013. Diffuse aortic atherosclerosis noted. Recommend followup by ultrasound in 2 years. This recommendation follows ACR consensus guidelines: White Paper of the ACR Incidental Findings Committee II on Vascular Findings. J Am Coll Radiol 2013; 10:789-794.** 4. No evidence of significant pulmonary embolus. 5. Focal kinking of the proximal celiac trunk, likely secondary to underlying mural thrombus, resulting in moderate severe focal luminal narrowing. Likely mild luminal narrowing at the origins of the patient's two left renal arteries. 6. At least three large duodenal diverticula noted tracking about the pancreatic head, the largest of which measures more than 4 cm and is filled with solid material, with diffuse surrounding soft tissue inflammation and fluid. Fluid tracks superiorly and inferiorly  along the duodenum, and anterior to Gerota's fascia on the right. Inflammation tracks along the mesenteric vasculature. This is concerning for underlying duodenal ulceration, possibly related to the large diverticulum. No definite evidence for bowel perforation at this time. 7. Soft tissue inflammation and fluid tracks about the pancreatic head, and acute pancreatitis cannot be excluded. However, this appears to be secondary to the process at the duodenum, given that the majority of the fluid and inflammation tracks about the duodenum. Would correlate with the patient's lipase. No evidence of devascularization or pseudocyst formation at this time. Minimal fluid tracks about the gallbladder, likely secondary to the duodenal process. 8. Enlarged prostate noted, measuring 5.5 cm in transverse dimension. This is relatively stable from 2013. 9. **An incidental finding of potential clinical significance has been found. Vague 2.1 cm focal ground-glass opacity at the left  lung apex. Initial follow-up with CT at 6-12 months is recommended to confirm persistence. If persistent, repeat CT is recommended every 2 years until 5 years of stability has been established. This recommendation follows the consensus statement: Guidelines for Management of Incidental Pulmonary Nodules Detected on CT Images: From the Fleischner Society 2017; Radiology 2017; 284:228-243.** 10. Small left inguinal hernia, containing only fat. 11. Diffuse diverticulosis along the entirety of the colon, without evidence of diverticulitis. 12. Left renal cyst noted. 13. Diffuse coronary artery calcifications seen. These results were called by telephone at the time of interpretation on 06/13/2016 at 9:40 pm to Dr. Pattricia Boss, who verbally acknowledged these results. Electronically Signed   By: Garald Balding M.D.   On: 06/13/2016 21:40    Procedures Procedures (including critical care time)  Medications Ordered in ED Medications  ondansetron (ZOFRAN)  injection 4 mg (4 mg Intravenous Given 06/13/16 1859)  morphine 2 MG/ML injection 2 mg (2 mg Intravenous Given 06/13/16 1900)  iopamidol (ISOVUE-370) 76 % injection 100 mL (100 mLs Intravenous Contrast Given 06/13/16 2051)  morphine 2 MG/ML injection 2 mg (2 mg Intravenous Given 06/13/16 2109)     Initial Impression / Assessment and Plan / ED Course  I have reviewed the triage vital signs and the nursing notes.  Pertinent labs & imaging results that were available during my care of the patient were reviewed by me and considered in my medical decision making (see chart for details).  Clinical Course    Discussed CT results with Dr. Radene Knee.   Discussed with Dr. Oneida Alar- advises change fluids to lr and hold on antibiotics unless fever. Plan admission to hospitalist.  82 year old man presents with epigastric pain with duodenal diverticulitis and pancreatic inflammation seen on CT. Related lipase and transaminases are consistent with this. I discussed the patient's care with Dr. Oneida Alar, on call for gastroenterology-she suspects that he may have pancreatitis which has caused the inflammation around the duodenal diverticuli. She advises to hydrate the patient with lactated Ringer's and not to give antibiotics unless he develops fever. They will see the patient in consult in the morning. Patient has remained hemodynamically stable but is requiring pain medication. He has not vomited since arrival to the emergency department. 1-pancreatitis 2 duodenal diverticulitis 3 abdominal aortic aneurysm 3.2 cm which appears stable from 2013-please see CT report patient will likely require follow-up with surgery 4-renal cyst  Discussed with Dr. Shanon Brow and will be admission to [place] bed Final Clinical Impressions(s) / ED Diagnoses   Final diagnoses:  Acute pancreatitis, unspecified complication status, unspecified pancreatitis type    New Prescriptions New Prescriptions   No medications on file       Pattricia Boss, MD 06/13/16 2226

## 2016-06-13 NOTE — ED Triage Notes (Signed)
Pt reports R sided chest pain that started approx 1 hour PTA. Pt reports nausea and weakness. No emesis, no SOB.

## 2016-06-14 DIAGNOSIS — E118 Type 2 diabetes mellitus with unspecified complications: Secondary | ICD-10-CM

## 2016-06-14 DIAGNOSIS — Z955 Presence of coronary angioplasty implant and graft: Secondary | ICD-10-CM

## 2016-06-14 DIAGNOSIS — E785 Hyperlipidemia, unspecified: Secondary | ICD-10-CM

## 2016-06-14 DIAGNOSIS — K851 Biliary acute pancreatitis without necrosis or infection: Secondary | ICD-10-CM

## 2016-06-14 LAB — COMPREHENSIVE METABOLIC PANEL
ALBUMIN: 3.4 g/dL — AB (ref 3.5–5.0)
ALT: 166 U/L — AB (ref 17–63)
AST: 217 U/L — ABNORMAL HIGH (ref 15–41)
Alkaline Phosphatase: 97 U/L (ref 38–126)
Anion gap: 11 (ref 5–15)
BUN: 25 mg/dL — AB (ref 6–20)
CHLORIDE: 98 mmol/L — AB (ref 101–111)
CO2: 27 mmol/L (ref 22–32)
Calcium: 8.4 mg/dL — ABNORMAL LOW (ref 8.9–10.3)
Creatinine, Ser: 1.08 mg/dL (ref 0.61–1.24)
GFR calc Af Amer: >60 mL/min (ref >60)
GFR calc non Af Amer: 58 mL/min — ABNORMAL LOW (ref >60)
GLUCOSE: 183 mg/dL — AB (ref 65–99)
POTASSIUM: 4.2 mmol/L (ref 3.5–5.1)
SODIUM: 136 mmol/L (ref 135–145)
Total Bilirubin: 0.7 mg/dL (ref 0.3–1.2)
Total Protein: 6.2 g/dL — ABNORMAL LOW (ref 6.5–8.1)

## 2016-06-14 LAB — LIPASE, BLOOD: Lipase: 191 U/L — ABNORMAL HIGH (ref 11–51)

## 2016-06-14 LAB — CBC
HEMATOCRIT: 38.8 % — AB (ref 39.0–52.0)
Hemoglobin: 12.7 g/dL — ABNORMAL LOW (ref 13.0–17.0)
MCH: 29.7 pg (ref 26.0–34.0)
MCHC: 32.7 g/dL (ref 30.0–36.0)
MCV: 90.7 fL (ref 78.0–100.0)
Platelets: 325 10*3/uL (ref 150–400)
RBC: 4.28 MIL/uL (ref 4.22–5.81)
RDW: 13 % (ref 11.5–15.5)
WBC: 18.7 10*3/uL — AB (ref 4.0–10.5)

## 2016-06-14 MED ORDER — MORPHINE SULFATE (PF) 4 MG/ML IV SOLN
4.0000 mg | INTRAVENOUS | Status: DC | PRN
  Administered 2016-06-14 (×4): 4 mg via INTRAVENOUS
  Filled 2016-06-14 (×4): qty 1

## 2016-06-14 MED ORDER — DEXTROSE 5 % IV SOLN
2.0000 g | INTRAVENOUS | Status: DC
  Administered 2016-06-14 – 2016-06-16 (×3): 2 g via INTRAVENOUS
  Filled 2016-06-14 (×6): qty 2

## 2016-06-14 MED ORDER — MORPHINE SULFATE (PF) 2 MG/ML IV SOLN: 2.0000 mg | INTRAVENOUS | Status: DC | PRN

## 2016-06-14 MED ORDER — ONDANSETRON HCL 4 MG/2ML IJ SOLN
4.0000 mg | Freq: Four times a day (QID) | INTRAMUSCULAR | Status: DC | PRN
  Administered 2016-06-14: 4 mg via INTRAVENOUS
  Filled 2016-06-14: qty 2

## 2016-06-14 MED ORDER — PANTOPRAZOLE SODIUM 40 MG IV SOLR
40.0000 mg | INTRAVENOUS | Status: DC
  Administered 2016-06-14 – 2016-06-20 (×7): 40 mg via INTRAVENOUS
  Filled 2016-06-14 (×7): qty 40

## 2016-06-14 MED ORDER — MORPHINE SULFATE (PF) 4 MG/ML IV SOLN
3.0000 mg | INTRAVENOUS | Status: DC | PRN
  Administered 2016-06-14 – 2016-06-19 (×13): 3 mg via INTRAVENOUS
  Filled 2016-06-14 (×15): qty 1

## 2016-06-14 MED ORDER — LACTATED RINGERS IV SOLN
INTRAVENOUS | Status: AC
  Administered 2016-06-14 (×2): via INTRAVENOUS

## 2016-06-14 MED ORDER — ONDANSETRON HCL 4 MG PO TABS: 4.0000 mg | ORAL_TABLET | Freq: Four times a day (QID) | ORAL | Status: DC | PRN

## 2016-06-14 MED ORDER — LACTATED RINGERS IV SOLN
INTRAVENOUS | Status: DC
  Administered 2016-06-14 – 2016-06-20 (×5): via INTRAVENOUS

## 2016-06-14 NOTE — Consult Note (Signed)
Reason for Consult:pancreatitis Referring Physician: Hospitalist  Jordan [name] is an 82 y.o. male.  HPI: Admitted thru the ED last night.      Pain in epigastric region. Pain started 5pm yesterday. States he vomited x 2.  Pain was unbearable. Denies simililar pain in the past. No hx of etoh. Continues to c/o pain. Pain started suddenly. Had been raking leaves most of the day before admission.  No excessive NSAIDs. Takes baby asa daily. Denies fever. Underwent CT chest which revealed: 7. Soft tissue inflammation and fluid tracks about the pancreatic head, and acute pancreatitis cannot be excluded. However, this appears to be secondary to the process at the duodenum, given that the majority of the fluid and inflammation tracks about the duodenum. Would correlate with the patient's lipase. No evidence of devascularization or pseudocyst formation at this time. Minimal fluid tracks about the gallbladder, likely secondary to the duodenal.  process.  Past Medical History:  Diagnosis Date  . Arthritis   . BPH (benign prostatic hypertrophy)   . CAD (coronary artery disease) no cardiologist--  sees pcp  dr fagan   s/p  PCI and stenting pRCA 2009  . Diverticulosis of colon   . Fecal urgency   . History of adenomatous polyp of colon    tubular adenoma's 05/ 2016  . Hyperlipidemia   . Hypertension   . Lower urinary tract symptoms (LUTS)   . S/P drug eluting coronary stent placement    12-23-2007  pRCA  . Type 2 diabetes mellitus ([place])   . Wears glasses     Past Surgical History:  Procedure Laterality Date  . CATARACT EXTRACTION W/ INTRAOCULAR LENS  IMPLANT, BILATERAL    . COLONOSCOPY  last one 11/16/2014   polpectomy  . CORONARY ANGIOPLASTY WITH STENT PLACEMENT  12-23-2007  dr hochrein   (abnormal myoview)  PCI w/ DES x1 pRCA (95%),  pCFX 25%,  mCFX 40-50%,  after PDA 30%,  prox, mid, and ostial LAD  30% calcification,  LM diffuse 25% calcification,  ef 60%  . EVALUATION UNDER  ANESTHESIA WITH ANAL FISTULECTOMY [name] 06/03/2015   Procedure: ANAL EXAM UNDER ANESTHESIA ,FISTULOTOMY ;  Surgeon: [name], MD;  Location: [facility];  Service: General;  Laterality: [name];  . INGUINAL HERNIA REPAIR Right 2001  . TRANSTHORACIC ECHOCARDIOGRAM  12-06-2007   normal LVF, ef 60%/  mild to moderate  AV calcification without stenosis,  mild AR and TR,  mild LAE    Family History  Problem Relation Age of Onset  . Colon cancer Neg Hx     Social History:  reports that he quit smoking about 33 years ago. His smoking use included Cigarettes. He quit after 30.00 years of use. He has never used smokeless tobacco. He reports that he does not drink alcohol or use drugs.  Allergies: No Known Allergies  Medications: I have reviewed the patient's current medications.    Results for orders placed or performed during the hospital encounter of 06/13/16 (from the past 48 hour(s))  CBC with Differential     Status: Abnormal   Collection Time: 06/13/16  6:45 PM  Result Value Ref Range   WBC 12.9 (H) 4.0 - 10.5 K/uL   RBC 4.41 4.22 - 5.81 MIL/uL   Hemoglobin 12.9 (L) 13.0 - 17.0 g/dL   HCT 40.0 39.0 - 52.0 %   MCV 90.7 78.0 - 100.0 fL   MCH 29.3 26.0 - 34.0 pg   MCHC 32.3 30.0 - 36.0 g/dL  RDW 12.7 11.5 - 15.5 %   Platelets 336 150 - 400 K/uL   Neutrophils Relative % 78 %   Neutro Abs 10.0 (H) 1.7 - 7.7 K/uL   Lymphocytes Relative 15 %   Lymphs Abs 1.9 0.7 - 4.0 K/uL   Monocytes Relative 6 %   Monocytes Absolute 0.8 0.1 - 1.0 K/uL   Eosinophils Relative 1 %   Eosinophils Absolute 0.2 0.0 - 0.7 K/uL   Basophils Relative 0 %   Basophils Absolute 0.0 0.0 - 0.1 K/uL  Comprehensive metabolic panel     Status: Abnormal   Collection Time: 06/13/16  6:45 PM  Result Value Ref Range   Sodium 135 135 - 145 mmol/L   Potassium 4.5 3.5 - 5.1 mmol/L   Chloride 98 (L) 101 - 111 mmol/L   CO2 28 22 - 32 mmol/L   Glucose, Bld 157 (H) 65 - 99 mg/dL   BUN 24 (H) 6 - 20 mg/dL    Creatinine, Ser 1.01 0.61 - 1.24 mg/dL   Calcium 8.9 8.9 - 10.3 mg/dL   Total Protein 6.5 6.5 - 8.1 g/dL   Albumin 3.6 3.5 - 5.0 g/dL   AST 219 (H) 15 - 41 U/L   ALT 78 (H) 17 - 63 U/L   Alkaline Phosphatase 91 38 - 126 U/L   Total Bilirubin 0.7 0.3 - 1.2 mg/dL   GFR calc non Af Amer >60 >60 mL/min   GFR calc Af Amer >60 >60 mL/min    Comment: (NOTE) The eGFR has been calculated using the CKD EPI equation. This calculation has not been validated in all clinical situations. eGFR's persistently <60 mL/min signify possible Chronic Kidney Disease.    Anion gap 9 5 - 15  Lipase, blood     Status: Abnormal   Collection Time: 06/13/16  6:45 PM  Result Value Ref Range   Lipase 674 (H) 11 - 51 U/L    Comment: RESULTS CONFIRMED BY MANUAL DILUTION  I-Stat Troponin, ED (not at [facility])     Status: None   Collection Time: 06/13/16  6:45 PM  Result Value Ref Range   Troponin i, poc 0.00 0.00 - 0.08 ng/mL   Comment 3            Comment: Due to the release kinetics of cTnI, a negative result within the first hours of the onset of symptoms does not rule out myocardial infarction with certainty. If myocardial infarction is still suspected, repeat the test at appropriate intervals.   Protime-INR     Status: None   Collection Time: 06/13/16  7:16 PM  Result Value Ref Range   Prothrombin Time 12.7 11.4 - 15.2 seconds   INR 0.95   Urinalysis, Routine w reflex microscopic (not at [facility])     Status: Abnormal   Collection Time: 06/13/16  8:50 PM  Result Value Ref Range   Color, Urine YELLOW YELLOW   APPearance CLEAR CLEAR   Specific Gravity, Urine 1.020 1.005 - 1.030   pH 5.5 5.0 - 8.0   Glucose, UA NEGATIVE NEGATIVE mg/dL   Hgb urine dipstick NEGATIVE NEGATIVE   Bilirubin Urine NEGATIVE NEGATIVE   Ketones, ur 15 (A) NEGATIVE mg/dL   Protein, ur NEGATIVE NEGATIVE mg/dL   Nitrite NEGATIVE NEGATIVE   Leukocytes, UA NEGATIVE NEGATIVE    Comment: MICROSCOPIC NOT DONE ON URINES WITH NEGATIVE  PROTEIN, BLOOD, LEUKOCYTES, NITRITE, OR GLUCOSE <1000 mg/dL.  Comprehensive metabolic panel     Status: Abnormal   Collection  Time: 06/14/16  4:50 AM  Result Value Ref Range   Sodium 136 135 - 145 mmol/L   Potassium 4.2 3.5 - 5.1 mmol/L   Chloride 98 (L) 101 - 111 mmol/L   CO2 27 22 - 32 mmol/L   Glucose, Bld 183 (H) 65 - 99 mg/dL   BUN 25 (H) 6 - 20 mg/dL   Creatinine, Ser 1.08 0.61 - 1.24 mg/dL   Calcium 8.4 (L) 8.9 - 10.3 mg/dL   Total Protein 6.2 (L) 6.5 - 8.1 g/dL   Albumin 3.4 (L) 3.5 - 5.0 g/dL   AST 217 (H) 15 - 41 U/L   ALT 166 (H) 17 - 63 U/L   Alkaline Phosphatase 97 38 - 126 U/L   Total Bilirubin 0.7 0.3 - 1.2 mg/dL   GFR calc non Af Amer 58 (L) >60 mL/min   GFR calc Af Amer >60 >60 mL/min    Comment: (NOTE) The eGFR has been calculated using the CKD EPI equation. This calculation has not been validated in all clinical situations. eGFR's persistently <60 mL/min signify possible Chronic Kidney Disease.    Anion gap 11 5 - 15  CBC     Status: Abnormal   Collection Time: 06/14/16  4:50 AM  Result Value Ref Range   WBC 18.7 (H) 4.0 - 10.5 K/uL   RBC 4.28 4.22 - 5.81 MIL/uL   Hemoglobin 12.7 (L) 13.0 - 17.0 g/dL   HCT 38.8 (L) 39.0 - 52.0 %   MCV 90.7 78.0 - 100.0 fL   MCH 29.7 26.0 - 34.0 pg   MCHC 32.7 30.0 - 36.0 g/dL   RDW 13.0 11.5 - 15.5 %   Platelets 325 150 - 400 K/uL  Lipase, blood     Status: Abnormal   Collection Time: 06/14/16  4:50 AM  Result Value Ref Range   Lipase 191 (H) 11 - 51 U/L    Ct Angio Chest/abd/pel For Dissection W And/or Wo Contrast  Result Date: 06/13/2016 CLINICAL DATA:  Acute onset of epigastric abdominal pain and nausea. Generalized weakness. Diarrhea and vomiting. Initial encounter. EXAM: CT ANGIOGRAPHY CHEST, ABDOMEN AND PELVIS TECHNIQUE: Multidetector CT imaging through the chest, abdomen and pelvis was performed using the standard protocol during bolus administration of intravenous contrast. Multiplanar reconstructed images  and MIPs were obtained and reviewed to evaluate the vascular anatomy. CONTRAST:  100 mL of Isovue 370 IV contrast COMPARISON:  CT of the abdomen and pelvis from 07/05/2012 FINDINGS: CTA CHEST FINDINGS Cardiovascular: There is no evidence of aortic dissection. There is no evidence of aneurysmal dilatation along the thoracic aorta. Scattered calcification is seen along the thoracic aorta and proximal great vessels. The great vessels are otherwise unremarkable in appearance. There is no evidence of significant pulmonary embolus. Diffuse coronary artery calcifications are seen. The heart is otherwise unremarkable. Mediastinum/Nodes: The heart is otherwise unremarkable in appearance. No mediastinal lymphadenopathy is seen. No pericardial effusion is identified. The thyroid gland is unremarkable. No axillary lymphadenopathy is appreciated. Lungs/Pleura: A vague focal 2.1 cm hazy opacity is noted at the left lung apex (image 18 of 58). Mild bibasilar atelectasis is noted. No pleural effusion or pneumothorax is seen. Musculoskeletal: No acute osseous abnormalities are identified. The visualized musculature is unremarkable in appearance. Review of the MIP images confirms the above findings. CTA ABDOMEN AND PELVIS FINDINGS VASCULAR Aorta: There is aneurysmal dilatation of the abdominal aorta at the level of the renal arteries to 3.2 cm in AP dimension, with an apparent focal right posterior  outpouching raising concern for underlying penetrating aortic ulceration at this level. However, this appears relatively stable from 2013. Inferior to the level of the renal arteries, there is dilatation of the abdominal aorta to 3.6 cm in AP and transverse dimensions, which resolves just proximal to the level of the aortic bifurcation. This appears relatively stable from 2013. Underlying diffuse calcification is seen along the abdominal aorta, with minimal associated mural thrombus but no significant luminal narrowing. There is no  evidence of aortic dissection. Celiac: There is focal kinking of the proximal celiac trunk, likely secondary to underlying mural thrombus, resulting in moderate to severe focal luminal narrowing. SMA: The superior mesenteric artery appears fully patent, with minimal calcification. Renals: Two renal arteries are noted on each side. These all appear patent, with likely mild luminal narrowing at the origins of both left renal arteries. IMA: The inferior mesenteric artery remains patent. Inflow: Diffuse calcification is seen along the common, internal and external iliac arteries, without significant luminal narrowing. Scattered calcification is seen along the common femoral arteries bilaterally, extending into the superficial femoral arteries. Veins: The visualized venous structures are unremarkable. The inferior vena cava is within normal limits. Review of the MIP images confirms the above findings. NON-VASCULAR Hepatobiliary: The liver is unremarkable in appearance. Trace fluid tracks about the gallbladder. This is thought be secondary to the process identified at the duodenum, given that the majority of fluid and inflammation is centered about the duodenum pancreatic head. The gallbladder is otherwise grossly unremarkable. The common bile duct remains normal in caliber. Pancreas: Soft tissue inflammation and fluid is noted about the pancreatic head. This could reflect acute pancreatitis. However, the inflammation appears to be centered about the duodenum. Would correlate with the patient's lipase. There is no evidence for devascularization or pseudocyst formation at this time. The remainder of the pancreas is unremarkable. Spleen: The spleen is diminutive and unremarkable in appearance. Adrenals/Urinary Tract: The adrenal glands are unremarkable. A 2.1 cm cyst is noted at the interpole region of the left kidney. Nonspecific perinephric stranding is noted bilaterally. There is no evidence of hydronephrosis. No renal  or ureteral stones are identified. Stomach/Bowel: There are at least three large duodenal diverticula noted tracking about the pancreatic head, the largest of which measures greater than 4 cm and is filled with solid material, with diffuse surrounding soft tissue inflammation and fluid. Fluid tracks superiorly and inferiorly along the duodenum, and anterior to Gerota's fascia on the right. Inflammation also tracks along the mesenteric vasculature. This raises concern for underlying duodenal ulceration, possibly related to the largest diverticulum. There is no definite evidence for bowel perforation at this time. The appendix is normal in caliber, without evidence of appendicitis. Diffuse diverticulosis is noted along the entirety of the colon, without evidence of diverticulitis. Remaining small bowel loops are unremarkable in appearance. The stomach is unremarkable in appearance. Lymphatic: No retroperitoneal or pelvic sidewall lymphadenopathy is seen. Reproductive: The bladder is mildly distended and grossly unremarkable. The prostate is enlarged, measuring 5.5 cm in transverse dimension, with minimal calcification. This is relatively stable from 2013. Other: A small left inguinal hernia is noted, containing only fat. Musculoskeletal: Mild vacuum phenomenon is noted along the lumbar spine, with underlying facet disease. Anterior bridging osteophytes are seen along the thoracic spine. No acute osseous abnormalities are identified. The visualized musculature is unremarkable in appearance. Review of the MIP images confirms the above findings. IMPRESSION: 1. No evidence of aortic dissection. 2. Aneurysmal dilatation of the abdominal aorta at the level  of the renal arteries to 3.2 cm in AP dimension, with an apparent focal right posterior outpouching raising concern for underlying penetrating aortic ulceration at this level. However, this appears relatively stable from 2013. Follow-up could be considered as deemed  clinically appropriate. 3. **An incidental finding of potential clinical significance has been found. Aneurysmal dilatation of the infrarenal abdominal aorta to 3.6 cm in AP and transverse dimensions, which resolves just proximal to the aortic bifurcation. This appears relatively stable from 2013. Diffuse aortic atherosclerosis noted. Recommend followup by ultrasound in 2 years. This recommendation follows ACR consensus guidelines: White Paper of the ACR Incidental Findings Committee II on Vascular Findings. J Am Coll Radiol 2013; 10:789-794.** 4. No evidence of significant pulmonary embolus. 5. Focal kinking of the proximal celiac trunk, likely secondary to underlying mural thrombus, resulting in moderate severe focal luminal narrowing. Likely mild luminal narrowing at the origins of the patient's two left renal arteries. 6. At least three large duodenal diverticula noted tracking about the pancreatic head, the largest of which measures more than 4 cm and is filled with solid material, with diffuse surrounding soft tissue inflammation and fluid. Fluid tracks superiorly and inferiorly along the duodenum, and anterior to Gerota's fascia on the right. Inflammation tracks along the mesenteric vasculature. This is concerning for underlying duodenal ulceration, possibly related to the large diverticulum. No definite evidence for bowel perforation at this time. 7. Soft tissue inflammation and fluid tracks about the pancreatic head, and acute pancreatitis cannot be excluded. However, this appears to be secondary to the process at the duodenum, given that the majority of the fluid and inflammation tracks about the duodenum. Would correlate with the patient's lipase. No evidence of devascularization or pseudocyst formation at this time. Minimal fluid tracks about the gallbladder, likely secondary to the duodenal process. 8. Enlarged prostate noted, measuring 5.5 cm in transverse dimension. This is relatively stable from  2013. 9. **An incidental finding of potential clinical significance has been found. Vague 2.1 cm focal ground-glass opacity at the left lung apex. Initial follow-up with CT at 6-12 months is recommended to confirm persistence. If persistent, repeat CT is recommended every 2 years until 5 years of stability has been established. This recommendation follows the consensus statement: Guidelines for Management of Incidental Pulmonary Nodules Detected on CT Images: From the Fleischner Society 2017; Radiology 2017; 284:228-243.** 10. Small left inguinal hernia, containing only fat. 11. Diffuse diverticulosis along the entirety of the colon, without evidence of diverticulitis. 12. Left renal cyst noted. 13. Diffuse coronary artery calcifications seen. These results were called by telephone at the time of interpretation on 06/13/2016 at 9:40 pm to Dr. Pattricia Boss, who verbally acknowledged these results. Electronically Signed   By: Garald Balding M.D.   On: 06/13/2016 21:40    ROS Blood pressure 107/69, pulse 77, temperature 97.6 F (36.4 C), temperature source Oral, resp. rate 20, height _0  (1.702 m), weight 143 lb 15.4 oz (65.3 kg), SpO2 90 %. Physical Exam Alert and oriented. Skin warm and dry. Oral mucosa is moist.   . Sclera anicteric, conjunctivae is pink. Thyroid not enlarged. No cervical lymphadenopathy. Lungs clear. Heart regular rate and rhythm. Murmur heard.   Abdomen is soft. Bowel sounds are positive. No hepatomegaly. No abdominal masses felt. Generalized abdominal tenderness, more so in the epigastric region. .  No edema to lower extremities.  .  Assessment/Plan: Acute pancreatitis. Probably biliary. Elevated liver enzymes.  He is scheduled for a CT abdomen/pelvis with CM today. Further recommendations  to follow.  Loura Pitt W 06/14/2016, 7:51 AM

## 2016-06-14 NOTE — Care Management Important Message (Signed)
Important Message  Patient Details  Name: Jordan [name] MRN: [id number] Date of Birth: 04/23/1923   Medicare Important Message Given:  Yes    Christofer Shen, [name], RN 06/14/2016, 2:02 PM

## 2016-06-14 NOTE — H&P (Signed)
History and Physical    Jordan [name] GUR:[id number] DOB: 11/21/1922 DOA: 06/13/2016  PCP: Asencion Noble, MD  Patient coming from:  home  Chief Complaint:  Abdominal pain  HPI: Jordan [name] is a 82 y.o. male with medical history significant of CAD, DM comes in with sudden onset of epigastric pain that started around 5pm today, with associated nausea and vomiting.  The pain is severe and radiates across the upper abdomen.  No h/o this happening before, still has gallbladder.  No fever.  No diarrhea.  No new meds.  Pt referred for admission with ct scan findings of acute pancreatitis and duodenal infection/ulcer.  Dr fields also called with GI.     Review of Systems: As per HPI otherwise 10 point review of systems negative.   Past Medical History:  Diagnosis Date  . Arthritis   . BPH (benign prostatic hypertrophy)   . CAD (coronary artery disease) no cardiologist--  sees pcp  dr fagan   s/p  PCI and stenting pRCA 2009  . Diverticulosis of colon   . Fecal urgency   . History of adenomatous polyp of colon    tubular adenoma's 05/ 2016  . Hyperlipidemia   . Hypertension   . Lower urinary tract symptoms (LUTS)   . S/P drug eluting coronary stent placement    12-23-2007  pRCA  . Type 2 diabetes mellitus ([place])   . Wears glasses     Past Surgical History:  Procedure Laterality Date  . CATARACT EXTRACTION W/ INTRAOCULAR LENS  IMPLANT, BILATERAL    . COLONOSCOPY  last one 11/16/2014   polpectomy  . CORONARY ANGIOPLASTY WITH STENT PLACEMENT  12-23-2007  dr hochrein   (abnormal myoview)  PCI w/ DES x1 pRCA (95%),  pCFX 25%,  mCFX 40-50%,  after PDA 30%,  prox, mid, and ostial LAD  30% calcification,  LM diffuse 25% calcification,  ef 60%  . EVALUATION UNDER ANESTHESIA WITH ANAL FISTULECTOMY [name] 06/03/2015   Procedure: ANAL EXAM UNDER ANESTHESIA ,FISTULOTOMY ;  Surgeon: [name], MD;  Location: [place];  Service: General;  Laterality: [name];  . INGUINAL HERNIA  REPAIR Right 2001  . TRANSTHORACIC ECHOCARDIOGRAM  12-06-2007   normal LVF, ef 60%/  mild to moderate  AV calcification without stenosis,  mild AR and TR,  mild LAE     reports that he quit smoking about 33 years ago. His smoking use included Cigarettes. He quit after 30.00 years of use. He has never used smokeless tobacco. He reports that he does not drink alcohol or use drugs.  No Known Allergies  Family History  Problem Relation Age of Onset  . Colon cancer Neg Hx     Prior to Admission medications   Medication Sig Start Date End Date Taking? Authorizing Provider  aspirin 81 MG tablet Take 81 mg by mouth every evening.    Yes Historical Provider, MD  donepezil (ARICEPT) 10 MG tablet Take 10 mg by mouth at bedtime.    Yes Historical Provider, MD  enalapril (VASOTEC) 10 MG tablet Take 10 mg by mouth daily.   Yes Historical Provider, MD  hydrochlorothiazide (MICROZIDE) 12.5 MG capsule Take 12.5 mg by mouth daily.   Yes Historical Provider, MD  HYDROcodone-acetaminophen (NORCO/VICODIN) 5-325 MG tablet Take 1 tablet by mouth every 4 (four) hours as needed. Patient taking differently: Take 1 tablet by mouth every 4 (four) hours as needed for moderate pain.  28/31/51  Yes [name], MD  memantine [facility]) 5  MG tablet Take 5 mg by mouth 2 (two) times daily.  06/02/16  Yes Historical Provider, MD  metFORMIN (GLUCOPHAGE) 500 MG tablet Take 500 mg by mouth 2 (two) times daily with a meal.   Yes Historical Provider, MD  polyethylene glycol powder (GLYCOLAX/MIRALAX) powder Take 17 g by mouth daily. For a bowel movement Patient taking differently: Take 17 g by mouth every evening. For a bowel movement 07/02/12  Yes Annitta Needs, NP  pravastatin (PRAVACHOL) 80 MG tablet Take 80 mg by mouth daily.   Yes Historical Provider, MD  tamsulosin (FLOMAX) 0.4 MG CAPS capsule Take 0.4 mg by mouth daily.  02/26/09  Yes Historical Provider, MD  Wheat Dextrin (BENEFIBER PO) Take by mouth daily.   Yes  Historical Provider, MD    Physical Exam: Vitals:   06/13/16 2200 06/13/16 2230 06/13/16 2300 06/13/16 2330  BP: 163/60 158/63 156/56 158/55  Pulse:  62 64 64  Resp: '21 23 26 23  '$ Temp:      TempSrc:      SpO2:  97% 92% 97%  Weight:      Height:        Constitutional: NAD, calm, comfortable Vitals:   06/13/16 2200 06/13/16 2230 06/13/16 2300 06/13/16 2330  BP: 163/60 158/63 156/56 158/55  Pulse:  62 64 64  Resp: '21 23 26 23  '$ Temp:      TempSrc:      SpO2:  97% 92% 97%  Weight:      Height:       Eyes: PERRL, lids and conjunctivae normal ENMT: Mucous membranes are moist. Posterior pharynx clear of any exudate or lesions.Normal dentition.  Neck: normal, supple, no masses, no thyromegaly Respiratory: clear to auscultation bilaterally, no wheezing, no crackles. Normal respiratory effort. No accessory muscle use.  Cardiovascular: Regular rate and rhythm, no murmurs / rubs / gallops. No extremity edema. 2+ pedal pulses. No carotid bruits.  Abdomen: ttp epigastrum no r/g ND, no masses palpated. No hepatosplenomegaly. Bowel sounds positive.  Musculoskeletal: no clubbing / cyanosis. No joint deformity upper and lower extremities. Good ROM, no contractures. Normal muscle tone.  Skin: no rashes, lesions, ulcers. No induration Neurologic: CN 2-12 grossly intact. Sensation intact, DTR normal. Strength 5/5 in all 4.  Psychiatric: Normal judgment and insight. Alert and oriented x 3. Normal mood.    Labs on Admission: I have personally reviewed following labs and imaging studies  CBC:  Recent Labs Lab 06/13/16 1845  WBC 12.9*  NEUTROABS 10.0*  HGB 12.9*  HCT 40.0  MCV 90.7  PLT 294   Basic Metabolic Panel:  Recent Labs Lab 06/13/16 1845  NA 135  K 4.5  CL 98*  CO2 28  GLUCOSE 157*  BUN 24*  CREATININE 1.01  CALCIUM 8.9   GFR: Estimated Creatinine Clearance: 44.5 mL/min (by C-G formula based on SCr of 1.01 mg/dL). Liver Function Tests:  Recent Labs Lab  06/13/16 1845  AST 219*  ALT 78*  ALKPHOS 91  BILITOT 0.7  PROT 6.5  ALBUMIN 3.6    Recent Labs Lab 06/13/16 1845  LIPASE 674*   Coagulation Profile:  Recent Labs Lab 06/13/16 1916  INR 0.95   Urine analysis:    Component Value Date/Time   COLORURINE YELLOW 06/13/2016 2050   [place] 06/13/2016 2050   LABSPEC 1.020 06/13/2016 2050   PHURINE 5.5 06/13/2016 2050   GLUCOSEU NEGATIVE 06/13/2016 2050   [place] NEGATIVE 06/13/2016 2050   [place] NEGATIVE 06/13/2016 2050   [place] Stain  15 (A) 06/13/2016 2050   PROTEINUR NEGATIVE 06/13/2016 2050   NITRITE NEGATIVE 06/13/2016 2050   LEUKOCYTESUR NEGATIVE 06/13/2016 2050   Radiological Exams on Admission: Ct Angio Chest/abd/pel For Dissection W And/or Wo Contrast  Result Date: 06/13/2016 CLINICAL DATA:  Acute onset of epigastric abdominal pain and nausea. Generalized weakness. Diarrhea and vomiting. Initial encounter. EXAM: CT ANGIOGRAPHY CHEST, ABDOMEN AND PELVIS TECHNIQUE: Multidetector CT imaging through the chest, abdomen and pelvis was performed using the standard protocol during bolus administration of intravenous contrast. Multiplanar reconstructed images and MIPs were obtained and reviewed to evaluate the vascular anatomy. CONTRAST:  100 mL of Isovue 370 IV contrast COMPARISON:  CT of the abdomen and pelvis from 07/05/2012 FINDINGS: CTA CHEST FINDINGS Cardiovascular: There is no evidence of aortic dissection. There is no evidence of aneurysmal dilatation along the thoracic aorta. Scattered calcification is seen along the thoracic aorta and proximal great vessels. The great vessels are otherwise unremarkable in appearance. There is no evidence of significant pulmonary embolus. Diffuse coronary artery calcifications are seen. The heart is otherwise unremarkable. Mediastinum/Nodes: The heart is otherwise unremarkable in appearance. No mediastinal lymphadenopathy is seen. No pericardial effusion is identified. The thyroid  gland is unremarkable. No axillary lymphadenopathy is appreciated. Lungs/Pleura: A vague focal 2.1 cm hazy opacity is noted at the left lung apex (image 18 of 58). Mild bibasilar atelectasis is noted. No pleural effusion or pneumothorax is seen. Musculoskeletal: No acute osseous abnormalities are identified. The visualized musculature is unremarkable in appearance. Review of the MIP images confirms the above findings. CTA ABDOMEN AND PELVIS FINDINGS VASCULAR Aorta: There is aneurysmal dilatation of the abdominal aorta at the level of the renal arteries to 3.2 cm in AP dimension, with an apparent focal right posterior outpouching raising concern for underlying penetrating aortic ulceration at this level. However, this appears relatively stable from 2013. Inferior to the level of the renal arteries, there is dilatation of the abdominal aorta to 3.6 cm in AP and transverse dimensions, which resolves just proximal to the level of the aortic bifurcation. This appears relatively stable from 2013. Underlying diffuse calcification is seen along the abdominal aorta, with minimal associated mural thrombus but no significant luminal narrowing. There is no evidence of aortic dissection. Celiac: There is focal kinking of the proximal celiac trunk, likely secondary to underlying mural thrombus, resulting in moderate to severe focal luminal narrowing. SMA: The superior mesenteric artery appears fully patent, with minimal calcification. Renals: Two renal arteries are noted on each side. These all appear patent, with likely mild luminal narrowing at the origins of both left renal arteries. IMA: The inferior mesenteric artery remains patent. Inflow: Diffuse calcification is seen along the common, internal and external iliac arteries, without significant luminal narrowing. Scattered calcification is seen along the common femoral arteries bilaterally, extending into the superficial femoral arteries. Veins: The visualized venous  structures are unremarkable. The inferior vena cava is within normal limits. Review of the MIP images confirms the above findings. NON-VASCULAR Hepatobiliary: The liver is unremarkable in appearance. Trace fluid tracks about the gallbladder. This is thought be secondary to the process identified at the duodenum, given that the majority of fluid and inflammation is centered about the duodenum pancreatic head. The gallbladder is otherwise grossly unremarkable. The common bile duct remains normal in caliber. Pancreas: Soft tissue inflammation and fluid is noted about the pancreatic head. This could reflect acute pancreatitis. However, the inflammation appears to be centered about the duodenum. Would correlate with the patient's lipase. There is  no evidence for devascularization or pseudocyst formation at this time. The remainder of the pancreas is unremarkable. Spleen: The spleen is diminutive and unremarkable in appearance. Adrenals/Urinary Tract: The adrenal glands are unremarkable. A 2.1 cm cyst is noted at the interpole region of the left kidney. Nonspecific perinephric stranding is noted bilaterally. There is no evidence of hydronephrosis. No renal or ureteral stones are identified. Stomach/Bowel: There are at least three large duodenal diverticula noted tracking about the pancreatic head, the largest of which measures greater than 4 cm and is filled with solid material, with diffuse surrounding soft tissue inflammation and fluid. Fluid tracks superiorly and inferiorly along the duodenum, and anterior to Gerota's fascia on the right. Inflammation also tracks along the mesenteric vasculature. This raises concern for underlying duodenal ulceration, possibly related to the largest diverticulum. There is no definite evidence for bowel perforation at this time. The appendix is normal in caliber, without evidence of appendicitis. Diffuse diverticulosis is noted along the entirety of the colon, without evidence of  diverticulitis. Remaining small bowel loops are unremarkable in appearance. The stomach is unremarkable in appearance. Lymphatic: No retroperitoneal or pelvic sidewall lymphadenopathy is seen. Reproductive: The bladder is mildly distended and grossly unremarkable. The prostate is enlarged, measuring 5.5 cm in transverse dimension, with minimal calcification. This is relatively stable from 2013. Other: A small left inguinal hernia is noted, containing only fat. Musculoskeletal: Mild vacuum phenomenon is noted along the lumbar spine, with underlying facet disease. Anterior bridging osteophytes are seen along the thoracic spine. No acute osseous abnormalities are identified. The visualized musculature is unremarkable in appearance. Review of the MIP images confirms the above findings. IMPRESSION: 1. No evidence of aortic dissection. 2. Aneurysmal dilatation of the abdominal aorta at the level of the renal arteries to 3.2 cm in AP dimension, with an apparent focal right posterior outpouching raising concern for underlying penetrating aortic ulceration at this level. However, this appears relatively stable from 2013. Follow-up could be considered as deemed clinically appropriate. 3. **An incidental finding of potential clinical significance has been found. Aneurysmal dilatation of the infrarenal abdominal aorta to 3.6 cm in AP and transverse dimensions, which resolves just proximal to the aortic bifurcation. This appears relatively stable from 2013. Diffuse aortic atherosclerosis noted. Recommend followup by ultrasound in 2 years. This recommendation follows ACR consensus guidelines: White Paper of the ACR Incidental Findings Committee II on Vascular Findings. J Am Coll Radiol 2013; 10:789-794.** 4. No evidence of significant pulmonary embolus. 5. Focal kinking of the proximal celiac trunk, likely secondary to underlying mural thrombus, resulting in moderate severe focal luminal narrowing. Likely mild luminal narrowing  at the origins of the patient's two left renal arteries. 6. At least three large duodenal diverticula noted tracking about the pancreatic head, the largest of which measures more than 4 cm and is filled with solid material, with diffuse surrounding soft tissue inflammation and fluid. Fluid tracks superiorly and inferiorly along the duodenum, and anterior to Gerota's fascia on the right. Inflammation tracks along the mesenteric vasculature. This is concerning for underlying duodenal ulceration, possibly related to the large diverticulum. No definite evidence for bowel perforation at this time. 7. Soft tissue inflammation and fluid tracks about the pancreatic head, and acute pancreatitis cannot be excluded. However, this appears to be secondary to the process at the duodenum, given that the majority of the fluid and inflammation tracks about the duodenum. Would correlate with the patient's lipase. No evidence of devascularization or pseudocyst formation at this time. Minimal fluid  tracks about the gallbladder, likely secondary to the duodenal process. 8. Enlarged prostate noted, measuring 5.5 cm in transverse dimension. This is relatively stable from 2013. 9. **An incidental finding of potential clinical significance has been found. Vague 2.1 cm focal ground-glass opacity at the left lung apex. Initial follow-up with CT at 6-12 months is recommended to confirm persistence. If persistent, repeat CT is recommended every 2 years until 5 years of stability has been established. This recommendation follows the consensus statement: Guidelines for Management of Incidental Pulmonary Nodules Detected on CT Images: From the Fleischner Society 2017; Radiology 2017; 284:228-243.** 10. Small left inguinal hernia, containing only fat. 11. Diffuse diverticulosis along the entirety of the colon, without evidence of diverticulitis. 12. Left renal cyst noted. 13. Diffuse coronary artery calcifications seen. These results were called by  telephone at the time of interpretation on 06/13/2016 at 9:40 pm to Dr. Pattricia Boss, who verbally acknowledged these results. Electronically Signed   By: Garald Balding M.D.   On: 06/13/2016 21:40    Assessment/Plan 82 yo male with acute pancreatitis, duodenal inflammation  Principal Problem:   Acute Pancreatitis- with associated duodenal inflammation.  LR at 200cc/hour.  Npo x ice chips.  Dr fields to see in am, may need EGD to evaluate better, no evidence of perforation at this time.  Also consider gallbladder disease, will await GI recommendations for first step in further work up.  Active Problems:   Diverticulosis of colon without hemorrhage- noted   Type 2 diabetes mellitus ([place])-  SSI   S/P drug eluting coronary stent placement- noted   Hypertension- stable   Hyperlipidemia- stable    DVT prophylaxis:  scds Code Status:  full Family Communication: none Disposition Plan:  Per day team Consults called:  Dr fields with GI team Admission status:  admission   Mahika Vanvoorhis A MD [facility]  If 7PM-7AM, please contact night-coverage [email or web address] Password [email or web address]  06/14/2016, 12:00 AM

## 2016-06-14 NOTE — Progress Notes (Signed)
Subjective: Jordan [name] was admitted yesterday with acute pancreatitis. He developed upper abdominal pain after working with leaves in his yard. He does not use alcohol. He had associated vomiting. He is feeling better this morning. He has received morphine for pain. He is afebrile. He underwent CT scan which revealed a normal gallbladder and bile duct. It appears he has inflammation in his duodenal area at the site of a diverticulum as well.  Objective: Vital signs in last 24 hours: Vitals:   06/13/16 2330 06/14/16 0000 06/14/16 0022 06/14/16 0500  BP: 158/55 158/55 (!) 169/51 107/69  Pulse: 64 66 69 77  Resp: '23 19 20 20  '$ Temp:  98.9 F (37.2 C) 99 F (37.2 C) 97.6 F (36.4 C)  TempSrc:  Oral Oral Oral  SpO2: 97% 100% 95% 90%  Weight:   143 lb 15.4 oz (65.3 kg)   Height:   '5\' 7"'$  (1.702 m)    Weight change:   Intake/Output Summary (Last 24 hours) at 06/14/16 1226 Last data filed at 06/14/16 0446  Gross per 24 hour  Intake              840 ml  Output              100 ml  Net              740 ml    Physical Exam: No distress. Alert and oriented. No scleral icterus. Lungs clear. Heart regular with a grade 2 systolic murmur. Abdomen mildly tender in the upper abdomen with no distention. Bowel sounds are present. No hepatosplenomegaly. Extremities reveal no edema.  Lab Results:    Results for orders placed or performed during the hospital encounter of 06/13/16 (from the past 24 hour(s))  CBC with Differential     Status: Abnormal   Collection Time: 06/13/16  6:45 PM  Result Value Ref Range   WBC 12.9 (H) 4.0 - 10.5 K/uL   RBC 4.41 4.22 - 5.81 MIL/uL   Hemoglobin 12.9 (L) 13.0 - 17.0 g/dL   HCT 40.0 39.0 - 52.0 %   MCV 90.7 78.0 - 100.0 fL   MCH 29.3 26.0 - 34.0 pg   MCHC 32.3 30.0 - 36.0 g/dL   RDW 12.7 11.5 - 15.5 %   Platelets 336 150 - 400 K/uL   Neutrophils Relative % 78 %   Neutro Abs 10.0 (H) 1.7 - 7.7 K/uL   Lymphocytes Relative 15 %   Lymphs Abs 1.9 0.7 - 4.0 K/uL    Monocytes Relative 6 %   Monocytes Absolute 0.8 0.1 - 1.0 K/uL   Eosinophils Relative 1 %   Eosinophils Absolute 0.2 0.0 - 0.7 K/uL   Basophils Relative 0 %   Basophils Absolute 0.0 0.0 - 0.1 K/uL  Comprehensive metabolic panel     Status: Abnormal   Collection Time: 06/13/16  6:45 PM  Result Value Ref Range   Sodium 135 135 - 145 mmol/L   Potassium 4.5 3.5 - 5.1 mmol/L   Chloride 98 (L) 101 - 111 mmol/L   CO2 28 22 - 32 mmol/L   Glucose, Bld 157 (H) 65 - 99 mg/dL   BUN 24 (H) 6 - 20 mg/dL   Creatinine, Ser 1.01 0.61 - 1.24 mg/dL   Calcium 8.9 8.9 - 10.3 mg/dL   Total Protein 6.5 6.5 - 8.1 g/dL   Albumin 3.6 3.5 - 5.0 g/dL   AST 219 (H) 15 - 41 U/L   ALT 78 (H) 17 -  63 U/L   Alkaline Phosphatase 91 38 - 126 U/L   Total Bilirubin 0.7 0.3 - 1.2 mg/dL   GFR calc non Af Amer >60 >60 mL/min   GFR calc Af Amer >60 >60 mL/min   Anion gap 9 5 - 15  Lipase, blood     Status: Abnormal   Collection Time: 06/13/16  6:45 PM  Result Value Ref Range   Lipase 674 (H) 11 - 51 U/L  I-Stat Troponin, ED (not at [facility])     Status: None   Collection Time: 06/13/16  6:45 PM  Result Value Ref Range   Troponin i, poc 0.00 0.00 - 0.08 ng/mL   Comment 3          Protime-INR     Status: None   Collection Time: 06/13/16  7:16 PM  Result Value Ref Range   Prothrombin Time 12.7 11.4 - 15.2 seconds   INR 0.95   Urinalysis, Routine w reflex microscopic (not at [facility])     Status: Abnormal   Collection Time: 06/13/16  8:50 PM  Result Value Ref Range   Color, Urine YELLOW YELLOW   APPearance CLEAR CLEAR   Specific Gravity, Urine 1.020 1.005 - 1.030   pH 5.5 5.0 - 8.0   Glucose, UA NEGATIVE NEGATIVE mg/dL   Hgb urine dipstick NEGATIVE NEGATIVE   Bilirubin Urine NEGATIVE NEGATIVE   Ketones, ur 15 (A) NEGATIVE mg/dL   Protein, ur NEGATIVE NEGATIVE mg/dL   Nitrite NEGATIVE NEGATIVE   Leukocytes, UA NEGATIVE NEGATIVE  Comprehensive metabolic panel     Status: Abnormal   Collection Time: 06/14/16  4:50 AM   Result Value Ref Range   Sodium 136 135 - 145 mmol/L   Potassium 4.2 3.5 - 5.1 mmol/L   Chloride 98 (L) 101 - 111 mmol/L   CO2 27 22 - 32 mmol/L   Glucose, Bld 183 (H) 65 - 99 mg/dL   BUN 25 (H) 6 - 20 mg/dL   Creatinine, Ser 1.08 0.61 - 1.24 mg/dL   Calcium 8.4 (L) 8.9 - 10.3 mg/dL   Total Protein 6.2 (L) 6.5 - 8.1 g/dL   Albumin 3.4 (L) 3.5 - 5.0 g/dL   AST 217 (H) 15 - 41 U/L   ALT 166 (H) 17 - 63 U/L   Alkaline Phosphatase 97 38 - 126 U/L   Total Bilirubin 0.7 0.3 - 1.2 mg/dL   GFR calc non Af Amer 58 (L) >60 mL/min   GFR calc Af Amer >60 >60 mL/min   Anion gap 11 5 - 15  CBC     Status: Abnormal   Collection Time: 06/14/16  4:50 AM  Result Value Ref Range   WBC 18.7 (H) 4.0 - 10.5 K/uL   RBC 4.28 4.22 - 5.81 MIL/uL   Hemoglobin 12.7 (L) 13.0 - 17.0 g/dL   HCT 38.8 (L) 39.0 - 52.0 %   MCV 90.7 78.0 - 100.0 fL   MCH 29.7 26.0 - 34.0 pg   MCHC 32.7 30.0 - 36.0 g/dL   RDW 13.0 11.5 - 15.5 %   Platelets 325 150 - 400 K/uL  Lipase, blood     Status: Abnormal   Collection Time: 06/14/16  4:50 AM  Result Value Ref Range   Lipase 191 (H) 11 - 51 U/L     ABGS No results for input(s): PHART, PO2ART, TCO2, HCO3 in the last 72 hours.  Invalid input(s): PCO2 CULTURES No results found for this or any previous visit (from the  past 240 hour(s)). Studies/Results: Ct Angio Chest/abd/pel For Dissection W And/or Wo Contrast  Result Date: 06/13/2016 CLINICAL DATA:  Acute onset of epigastric abdominal pain and nausea. Generalized weakness. Diarrhea and vomiting. Initial encounter. EXAM: CT ANGIOGRAPHY CHEST, ABDOMEN AND PELVIS TECHNIQUE: Multidetector CT imaging through the chest, abdomen and pelvis was performed using the standard protocol during bolus administration of intravenous contrast. Multiplanar reconstructed images and MIPs were obtained and reviewed to evaluate the vascular anatomy. CONTRAST:  100 mL of Isovue 370 IV contrast COMPARISON:  CT of the abdomen and pelvis from  07/05/2012 FINDINGS: CTA CHEST FINDINGS Cardiovascular: There is no evidence of aortic dissection. There is no evidence of aneurysmal dilatation along the thoracic aorta. Scattered calcification is seen along the thoracic aorta and proximal great vessels. The great vessels are otherwise unremarkable in appearance. There is no evidence of significant pulmonary embolus. Diffuse coronary artery calcifications are seen. The heart is otherwise unremarkable. Mediastinum/Nodes: The heart is otherwise unremarkable in appearance. No mediastinal lymphadenopathy is seen. No pericardial effusion is identified. The thyroid gland is unremarkable. No axillary lymphadenopathy is appreciated. Lungs/Pleura: A vague focal 2.1 cm hazy opacity is noted at the left lung apex (image 18 of 58). Mild bibasilar atelectasis is noted. No pleural effusion or pneumothorax is seen. Musculoskeletal: No acute osseous abnormalities are identified. The visualized musculature is unremarkable in appearance. Review of the MIP images confirms the above findings. CTA ABDOMEN AND PELVIS FINDINGS VASCULAR Aorta: There is aneurysmal dilatation of the abdominal aorta at the level of the renal arteries to 3.2 cm in AP dimension, with an apparent focal right posterior outpouching raising concern for underlying penetrating aortic ulceration at this level. However, this appears relatively stable from 2013. Inferior to the level of the renal arteries, there is dilatation of the abdominal aorta to 3.6 cm in AP and transverse dimensions, which resolves just proximal to the level of the aortic bifurcation. This appears relatively stable from 2013. Underlying diffuse calcification is seen along the abdominal aorta, with minimal associated mural thrombus but no significant luminal narrowing. There is no evidence of aortic dissection. Celiac: There is focal kinking of the proximal celiac trunk, likely secondary to underlying mural thrombus, resulting in moderate to  severe focal luminal narrowing. SMA: The superior mesenteric artery appears fully patent, with minimal calcification. Renals: Two renal arteries are noted on each side. These all appear patent, with likely mild luminal narrowing at the origins of both left renal arteries. IMA: The inferior mesenteric artery remains patent. Inflow: Diffuse calcification is seen along the common, internal and external iliac arteries, without significant luminal narrowing. Scattered calcification is seen along the common femoral arteries bilaterally, extending into the superficial femoral arteries. Veins: The visualized venous structures are unremarkable. The inferior vena cava is within normal limits. Review of the MIP images confirms the above findings. NON-VASCULAR Hepatobiliary: The liver is unremarkable in appearance. Trace fluid tracks about the gallbladder. This is thought be secondary to the process identified at the duodenum, given that the majority of fluid and inflammation is centered about the duodenum pancreatic head. The gallbladder is otherwise grossly unremarkable. The common bile duct remains normal in caliber. Pancreas: Soft tissue inflammation and fluid is noted about the pancreatic head. This could reflect acute pancreatitis. However, the inflammation appears to be centered about the duodenum. Would correlate with the patient's lipase. There is no evidence for devascularization or pseudocyst formation at this time. The remainder of the pancreas is unremarkable. Spleen: The spleen is diminutive and unremarkable  in appearance. Adrenals/Urinary Tract: The adrenal glands are unremarkable. A 2.1 cm cyst is noted at the interpole region of the left kidney. Nonspecific perinephric stranding is noted bilaterally. There is no evidence of hydronephrosis. No renal or ureteral stones are identified. Stomach/Bowel: There are at least three large duodenal diverticula noted tracking about the pancreatic head, the largest of which  measures greater than 4 cm and is filled with solid material, with diffuse surrounding soft tissue inflammation and fluid. Fluid tracks superiorly and inferiorly along the duodenum, and anterior to Gerota's fascia on the right. Inflammation also tracks along the mesenteric vasculature. This raises concern for underlying duodenal ulceration, possibly related to the largest diverticulum. There is no definite evidence for bowel perforation at this time. The appendix is normal in caliber, without evidence of appendicitis. Diffuse diverticulosis is noted along the entirety of the colon, without evidence of diverticulitis. Remaining small bowel loops are unremarkable in appearance. The stomach is unremarkable in appearance. Lymphatic: No retroperitoneal or pelvic sidewall lymphadenopathy is seen. Reproductive: The bladder is mildly distended and grossly unremarkable. The prostate is enlarged, measuring 5.5 cm in transverse dimension, with minimal calcification. This is relatively stable from 2013. Other: A small left inguinal hernia is noted, containing only fat. Musculoskeletal: Mild vacuum phenomenon is noted along the lumbar spine, with underlying facet disease. Anterior bridging osteophytes are seen along the thoracic spine. No acute osseous abnormalities are identified. The visualized musculature is unremarkable in appearance. Review of the MIP images confirms the above findings. IMPRESSION: 1. No evidence of aortic dissection. 2. Aneurysmal dilatation of the abdominal aorta at the level of the renal arteries to 3.2 cm in AP dimension, with an apparent focal right posterior outpouching raising concern for underlying penetrating aortic ulceration at this level. However, this appears relatively stable from 2013. Follow-up could be considered as deemed clinically appropriate. 3. **An incidental finding of potential clinical significance has been found. Aneurysmal dilatation of the infrarenal abdominal aorta to 3.6 cm in  AP and transverse dimensions, which resolves just proximal to the aortic bifurcation. This appears relatively stable from 2013. Diffuse aortic atherosclerosis noted. Recommend followup by ultrasound in 2 years. This recommendation follows ACR consensus guidelines: White Paper of the ACR Incidental Findings Committee II on Vascular Findings. J Am Coll Radiol 2013; 10:789-794.** 4. No evidence of significant pulmonary embolus. 5. Focal kinking of the proximal celiac trunk, likely secondary to underlying mural thrombus, resulting in moderate severe focal luminal narrowing. Likely mild luminal narrowing at the origins of the patient's two left renal arteries. 6. At least three large duodenal diverticula noted tracking about the pancreatic head, the largest of which measures more than 4 cm and is filled with solid material, with diffuse surrounding soft tissue inflammation and fluid. Fluid tracks superiorly and inferiorly along the duodenum, and anterior to Gerota's fascia on the right. Inflammation tracks along the mesenteric vasculature. This is concerning for underlying duodenal ulceration, possibly related to the large diverticulum. No definite evidence for bowel perforation at this time. 7. Soft tissue inflammation and fluid tracks about the pancreatic head, and acute pancreatitis cannot be excluded. However, this appears to be secondary to the process at the duodenum, given that the majority of the fluid and inflammation tracks about the duodenum. Would correlate with the patient's lipase. No evidence of devascularization or pseudocyst formation at this time. Minimal fluid tracks about the gallbladder, likely secondary to the duodenal process. 8. Enlarged prostate noted, measuring 5.5 cm in transverse dimension. This is relatively stable  from 2013. 9. **An incidental finding of potential clinical significance has been found. Vague 2.1 cm focal ground-glass opacity at the left lung apex. Initial follow-up with CT at  6-12 months is recommended to confirm persistence. If persistent, repeat CT is recommended every 2 years until 5 years of stability has been established. This recommendation follows the consensus statement: Guidelines for Management of Incidental Pulmonary Nodules Detected on CT Images: From the Fleischner Society 2017; Radiology 2017; 284:228-243.** 10. Small left inguinal hernia, containing only fat. 11. Diffuse diverticulosis along the entirety of the colon, without evidence of diverticulitis. 12. Left renal cyst noted. 13. Diffuse coronary artery calcifications seen. These results were called by telephone at the time of interpretation on 06/13/2016 at 9:40 pm to Dr. Pattricia Boss, who verbally acknowledged these results. Electronically Signed   By: Garald Balding M.D.   On: 06/13/2016 21:40   Micro Results: No results found for this or any previous visit (from the past 240 hour(s)). Studies/Results: Ct Angio Chest/abd/pel For Dissection W And/or Wo Contrast  Result Date: 06/13/2016 CLINICAL DATA:  Acute onset of epigastric abdominal pain and nausea. Generalized weakness. Diarrhea and vomiting. Initial encounter. EXAM: CT ANGIOGRAPHY CHEST, ABDOMEN AND PELVIS TECHNIQUE: Multidetector CT imaging through the chest, abdomen and pelvis was performed using the standard protocol during bolus administration of intravenous contrast. Multiplanar reconstructed images and MIPs were obtained and reviewed to evaluate the vascular anatomy. CONTRAST:  100 mL of Isovue 370 IV contrast COMPARISON:  CT of the abdomen and pelvis from 07/05/2012 FINDINGS: CTA CHEST FINDINGS Cardiovascular: There is no evidence of aortic dissection. There is no evidence of aneurysmal dilatation along the thoracic aorta. Scattered calcification is seen along the thoracic aorta and proximal great vessels. The great vessels are otherwise unremarkable in appearance. There is no evidence of significant pulmonary embolus. Diffuse coronary artery  calcifications are seen. The heart is otherwise unremarkable. Mediastinum/Nodes: The heart is otherwise unremarkable in appearance. No mediastinal lymphadenopathy is seen. No pericardial effusion is identified. The thyroid gland is unremarkable. No axillary lymphadenopathy is appreciated. Lungs/Pleura: A vague focal 2.1 cm hazy opacity is noted at the left lung apex (image 18 of 58). Mild bibasilar atelectasis is noted. No pleural effusion or pneumothorax is seen. Musculoskeletal: No acute osseous abnormalities are identified. The visualized musculature is unremarkable in appearance. Review of the MIP images confirms the above findings. CTA ABDOMEN AND PELVIS FINDINGS VASCULAR Aorta: There is aneurysmal dilatation of the abdominal aorta at the level of the renal arteries to 3.2 cm in AP dimension, with an apparent focal right posterior outpouching raising concern for underlying penetrating aortic ulceration at this level. However, this appears relatively stable from 2013. Inferior to the level of the renal arteries, there is dilatation of the abdominal aorta to 3.6 cm in AP and transverse dimensions, which resolves just proximal to the level of the aortic bifurcation. This appears relatively stable from 2013. Underlying diffuse calcification is seen along the abdominal aorta, with minimal associated mural thrombus but no significant luminal narrowing. There is no evidence of aortic dissection. Celiac: There is focal kinking of the proximal celiac trunk, likely secondary to underlying mural thrombus, resulting in moderate to severe focal luminal narrowing. SMA: The superior mesenteric artery appears fully patent, with minimal calcification. Renals: Two renal arteries are noted on each side. These all appear patent, with likely mild luminal narrowing at the origins of both left renal arteries. IMA: The inferior mesenteric artery remains patent. Inflow: Diffuse calcification is seen along the  common, internal and  external iliac arteries, without significant luminal narrowing. Scattered calcification is seen along the common femoral arteries bilaterally, extending into the superficial femoral arteries. Veins: The visualized venous structures are unremarkable. The inferior vena cava is within normal limits. Review of the MIP images confirms the above findings. NON-VASCULAR Hepatobiliary: The liver is unremarkable in appearance. Trace fluid tracks about the gallbladder. This is thought be secondary to the process identified at the duodenum, given that the majority of fluid and inflammation is centered about the duodenum pancreatic head. The gallbladder is otherwise grossly unremarkable. The common bile duct remains normal in caliber. Pancreas: Soft tissue inflammation and fluid is noted about the pancreatic head. This could reflect acute pancreatitis. However, the inflammation appears to be centered about the duodenum. Would correlate with the patient's lipase. There is no evidence for devascularization or pseudocyst formation at this time. The remainder of the pancreas is unremarkable. Spleen: The spleen is diminutive and unremarkable in appearance. Adrenals/Urinary Tract: The adrenal glands are unremarkable. A 2.1 cm cyst is noted at the interpole region of the left kidney. Nonspecific perinephric stranding is noted bilaterally. There is no evidence of hydronephrosis. No renal or ureteral stones are identified. Stomach/Bowel: There are at least three large duodenal diverticula noted tracking about the pancreatic head, the largest of which measures greater than 4 cm and is filled with solid material, with diffuse surrounding soft tissue inflammation and fluid. Fluid tracks superiorly and inferiorly along the duodenum, and anterior to Gerota's fascia on the right. Inflammation also tracks along the mesenteric vasculature. This raises concern for underlying duodenal ulceration, possibly related to the largest diverticulum. There  is no definite evidence for bowel perforation at this time. The appendix is normal in caliber, without evidence of appendicitis. Diffuse diverticulosis is noted along the entirety of the colon, without evidence of diverticulitis. Remaining small bowel loops are unremarkable in appearance. The stomach is unremarkable in appearance. Lymphatic: No retroperitoneal or pelvic sidewall lymphadenopathy is seen. Reproductive: The bladder is mildly distended and grossly unremarkable. The prostate is enlarged, measuring 5.5 cm in transverse dimension, with minimal calcification. This is relatively stable from 2013. Other: A small left inguinal hernia is noted, containing only fat. Musculoskeletal: Mild vacuum phenomenon is noted along the lumbar spine, with underlying facet disease. Anterior bridging osteophytes are seen along the thoracic spine. No acute osseous abnormalities are identified. The visualized musculature is unremarkable in appearance. Review of the MIP images confirms the above findings. IMPRESSION: 1. No evidence of aortic dissection. 2. Aneurysmal dilatation of the abdominal aorta at the level of the renal arteries to 3.2 cm in AP dimension, with an apparent focal right posterior outpouching raising concern for underlying penetrating aortic ulceration at this level. However, this appears relatively stable from 2013. Follow-up could be considered as deemed clinically appropriate. 3. **An incidental finding of potential clinical significance has been found. Aneurysmal dilatation of the infrarenal abdominal aorta to 3.6 cm in AP and transverse dimensions, which resolves just proximal to the aortic bifurcation. This appears relatively stable from 2013. Diffuse aortic atherosclerosis noted. Recommend followup by ultrasound in 2 years. This recommendation follows ACR consensus guidelines: White Paper of the ACR Incidental Findings Committee II on Vascular Findings. J Am Coll Radiol 2013; 10:789-794.** 4. No evidence  of significant pulmonary embolus. 5. Focal kinking of the proximal celiac trunk, likely secondary to underlying mural thrombus, resulting in moderate severe focal luminal narrowing. Likely mild luminal narrowing at the origins of the patient's two left renal  arteries. 6. At least three large duodenal diverticula noted tracking about the pancreatic head, the largest of which measures more than 4 cm and is filled with solid material, with diffuse surrounding soft tissue inflammation and fluid. Fluid tracks superiorly and inferiorly along the duodenum, and anterior to Gerota's fascia on the right. Inflammation tracks along the mesenteric vasculature. This is concerning for underlying duodenal ulceration, possibly related to the large diverticulum. No definite evidence for bowel perforation at this time. 7. Soft tissue inflammation and fluid tracks about the pancreatic head, and acute pancreatitis cannot be excluded. However, this appears to be secondary to the process at the duodenum, given that the majority of the fluid and inflammation tracks about the duodenum. Would correlate with the patient's lipase. No evidence of devascularization or pseudocyst formation at this time. Minimal fluid tracks about the gallbladder, likely secondary to the duodenal process. 8. Enlarged prostate noted, measuring 5.5 cm in transverse dimension. This is relatively stable from 2013. 9. **An incidental finding of potential clinical significance has been found. Vague 2.1 cm focal ground-glass opacity at the left lung apex. Initial follow-up with CT at 6-12 months is recommended to confirm persistence. If persistent, repeat CT is recommended every 2 years until 5 years of stability has been established. This recommendation follows the consensus statement: Guidelines for Management of Incidental Pulmonary Nodules Detected on CT Images: From the Fleischner Society 2017; Radiology 2017; 284:228-243.** 10. Small left inguinal hernia,  containing only fat. 11. Diffuse diverticulosis along the entirety of the colon, without evidence of diverticulitis. 12. Left renal cyst noted. 13. Diffuse coronary artery calcifications seen. These results were called by telephone at the time of interpretation on 06/13/2016 at 9:40 pm to Dr. Pattricia Boss, who verbally acknowledged these results. Electronically Signed   By: Garald Balding M.D.   On: 06/13/2016 21:40   Medications:  I have reviewed the patient's current medications Scheduled Meds: . cefTRIAXone (ROCEPHIN)  IV  2 g Intravenous Q24H  . pantoprazole (PROTONIX) IV  40 mg Intravenous Q24H   Continuous Infusions: PRN Meds:.morphine injection, ondansetron **OR** ondansetron (ZOFRAN) IV   Assessment/Plan: #1. Acute pancreatitis. He has associated inflammatory changes in the duodenum as well. Lipase has improved from 674-191. Continue bowel rest. Continue IV fluids but decrease rate to 100 ML's per hour. Gastroenterology consulted. Transaminases are elevated as well. #2. Diabetes. Stable control. Glucose is 157. #3. Cognitive impairment. Stable. Aricept and Namenda are being held. #4. Coronary artery disease. Asymptomatic.  Discussed with GI. Antibiotic therapy will be initiated. Principal Problem:   Pancreatitis Active Problems:   Diverticulosis of colon without hemorrhage   Type 2 diabetes mellitus with complication (HCC)   S/P drug eluting coronary stent placement   Hypertension   Hyperlipidemia     LOS: 1 day   Thresea Doble 06/14/2016, 12:26 PM

## 2016-06-15 ENCOUNTER — Inpatient Hospital Stay (HOSPITAL_COMMUNITY): Payer: Medicare Other

## 2016-06-15 LAB — COMPREHENSIVE METABOLIC PANEL
ALBUMIN: 2.8 g/dL — AB (ref 3.5–5.0)
ALT: 83 U/L — ABNORMAL HIGH (ref 17–63)
ANION GAP: 8 (ref 5–15)
AST: 66 U/L — ABNORMAL HIGH (ref 15–41)
Alkaline Phosphatase: 75 U/L (ref 38–126)
BILIRUBIN TOTAL: 0.8 mg/dL (ref 0.3–1.2)
BUN: 29 mg/dL — ABNORMAL HIGH (ref 6–20)
CO2: 30 mmol/L (ref 22–32)
Calcium: 8 mg/dL — ABNORMAL LOW (ref 8.9–10.3)
Chloride: 99 mmol/L — ABNORMAL LOW (ref 101–111)
Creatinine, Ser: 0.95 mg/dL (ref 0.61–1.24)
GFR calc Af Amer: >60 mL/min (ref >60)
GFR calc non Af Amer: >60 mL/min (ref >60)
GLUCOSE: 133 mg/dL — AB (ref 65–99)
POTASSIUM: 3.8 mmol/L (ref 3.5–5.1)
SODIUM: 137 mmol/L (ref 135–145)
TOTAL PROTEIN: 5.4 g/dL — AB (ref 6.5–8.1)

## 2016-06-15 LAB — CBC
HEMATOCRIT: 36 % — AB (ref 39.0–52.0)
Hemoglobin: 11.9 g/dL — ABNORMAL LOW (ref 13.0–17.0)
MCH: 30.4 pg (ref 26.0–34.0)
MCHC: 33.1 g/dL (ref 30.0–36.0)
MCV: 92.1 fL (ref 78.0–100.0)
Platelets: 291 10*3/uL (ref 150–400)
RBC: 3.91 MIL/uL — ABNORMAL LOW (ref 4.22–5.81)
RDW: 13.4 % (ref 11.5–15.5)
WBC: 15.6 10*3/uL — ABNORMAL HIGH (ref 4.0–10.5)

## 2016-06-15 LAB — LIPASE, BLOOD: LIPASE: 78 U/L — AB (ref 11–51)

## 2016-06-15 MED ORDER — GADOBENATE DIMEGLUMINE 529 MG/ML IV SOLN
13.0000 mL | Freq: Once | INTRAVENOUS | Status: AC | PRN
  Administered 2016-06-15: 13 mL via INTRAVENOUS

## 2016-06-15 MED ORDER — ENALAPRIL MALEATE 5 MG PO TABS
10.0000 mg | ORAL_TABLET | Freq: Every day | ORAL | Status: DC
  Administered 2016-06-15 – 2016-06-20 (×6): 10 mg via ORAL
  Filled 2016-06-15 (×7): qty 2

## 2016-06-15 NOTE — Progress Notes (Signed)
  Subjective:  Patient feels pain is not as intense as yesterday. He still rates it as 4 or 5. He is getting relief with pain medication. He denies nausea or vomiting but does not have an appetite. He denies chest pain or shortness of breath. He is passing flatus.  Objective: Blood pressure (!) 146/56, pulse 83, temperature 100.1 F (37.8 C), temperature source Oral, resp. rate 18, height '5\' 7"'$  (1.702 m), weight 143 lb 15.4 oz (65.3 kg), SpO2 93 %. Patient is alert and in no acute distress. Sclera is nonicteric. Systolic murmur is unchanged. Lungs clear to auscultation. Abdomen. Bowel sounds are normal. Abdomen is soft with moderate midepigastric tenderness along with mild tenderness below both costal margins. No organomegaly or masses. No LE edema or clubbing noted.  Labs/studies Results:   Recent Labs  06/13/16 1845 06/14/16 0450 06/15/16 0458  WBC 12.9* 18.7* 15.6*  HGB 12.9* 12.7* 11.9*  HCT 40.0 38.8* 36.0*  PLT 336 325 291    BMET   Recent Labs  06/13/16 1845 06/14/16 0450 06/15/16 0458  NA 135 136 137  K 4.5 4.2 3.8  CL 98* 98* 99*  CO2 '28 27 30  '$ GLUCOSE 157* 183* 133*  BUN 24* 25* 29*  CREATININE 1.01 1.08 0.95  CALCIUM 8.9 8.4* 8.0*    LFT   Recent Labs  06/13/16 1845 06/14/16 0450 06/15/16 0458  PROT 6.5 6.2* 5.4*  ALBUMIN 3.6 3.4* 2.8*  AST 219* 217* 66*  ALT 78* 166* 83*  ALKPHOS 91 97 75  BILITOT 0.7 0.7 0.8    PT/INR   Recent Labs  06/13/16 1916  LABPROT 12.7  INR 0.95    MRCP. Fatty reviewed with patient and her daughter [name]. No evidence of cholelithiasis or choledocholithiasis. Duodenal diverticulosis and ? Diverticulitis. There is no impingement on bile duct or pancreatic duct. No evidence of pancreatic mass.    Assessment:  #1. Acute pancreatitis most likely secondary biliary in origin no stones noted. He possibly passed a small stone spontaneously. Duodenal diverticulitis is rare cause of pancreatitis. I am not convinced  that he has duodenal diverticulitis and I do not see impingement on pancreatic or bile duct from these duodenal diverticula. Ceftriaxone should suffice if he has duodenal diverticulitis. He is gradually improving.  Recommendations:  CBC and serum met in a.m. Clear liquid starting tomorrow morning. Will get another opinion on MRCP.

## 2016-06-15 NOTE — Progress Notes (Signed)
Subjective: He is feeling less pain now. No vomiting.  Objective: Vital signs in last 24 hours: Vitals:   06/14/16 0022 06/14/16 0500 06/14/16 2109 06/15/16 0517  BP: (!) 169/51 107/69 (!) 147/54 (!) 158/62  Pulse: 69 77 95 96  Resp: '20 20 20 20  '$ Temp: 99 F (37.2 C) 97.6 F (36.4 C) 99.1 F (37.3 C) 99.7 F (37.6 C)  TempSrc: Oral Oral Oral Oral  SpO2: 95% 90% 94% 96%  Weight: 143 lb 15.4 oz (65.3 kg)     Height: '5\' 7"'$  (1.702 m)      Weight change:   Intake/Output Summary (Last 24 hours) at 06/15/16 0730 Last data filed at 06/14/16 1900  Gross per 24 hour  Intake             1370 ml  Output                0 ml  Net             1370 ml    Physical Exam: Alert. No distress. Lungs clear. Heart regular with a grade 2 systolic murmur. Abdomen soft and nondistended. Mild epigastric tenderness present.  Lab Results:    Results for orders placed or performed during the hospital encounter of 06/13/16 (from the past 24 hour(s))  CBC     Status: Abnormal   Collection Time: 06/15/16  4:58 AM  Result Value Ref Range   WBC 15.6 (H) 4.0 - 10.5 K/uL   RBC 3.91 (L) 4.22 - 5.81 MIL/uL   Hemoglobin 11.9 (L) 13.0 - 17.0 g/dL   HCT 36.0 (L) 39.0 - 52.0 %   MCV 92.1 78.0 - 100.0 fL   MCH 30.4 26.0 - 34.0 pg   MCHC 33.1 30.0 - 36.0 g/dL   RDW 13.4 11.5 - 15.5 %   Platelets 291 150 - 400 K/uL  Comprehensive metabolic panel     Status: Abnormal   Collection Time: 06/15/16  4:58 AM  Result Value Ref Range   Sodium 137 135 - 145 mmol/L   Potassium 3.8 3.5 - 5.1 mmol/L   Chloride 99 (L) 101 - 111 mmol/L   CO2 30 22 - 32 mmol/L   Glucose, Bld 133 (H) 65 - 99 mg/dL   BUN 29 (H) 6 - 20 mg/dL   Creatinine, Ser 0.95 0.61 - 1.24 mg/dL   Calcium 8.0 (L) 8.9 - 10.3 mg/dL   Total Protein 5.4 (L) 6.5 - 8.1 g/dL   Albumin 2.8 (L) 3.5 - 5.0 g/dL   AST 66 (H) 15 - 41 U/L   ALT 83 (H) 17 - 63 U/L   Alkaline Phosphatase 75 38 - 126 U/L   Total Bilirubin 0.8 0.3 - 1.2 mg/dL   GFR calc non Af  Amer >60 >60 mL/min   GFR calc Af Amer >60 >60 mL/min   Anion gap 8 5 - 15  Lipase, blood     Status: Abnormal   Collection Time: 06/15/16  4:58 AM  Result Value Ref Range   Lipase 78 (H) 11 - 51 U/L     ABGS No results for input(s): PHART, PO2ART, TCO2, HCO3 in the last 72 hours.  Invalid input(s): PCO2 CULTURES No results found for this or any previous visit (from the past 240 hour(s)). Studies/Results: Ct Angio Chest/abd/pel For Dissection W And/or Wo Contrast  Result Date: 06/13/2016 CLINICAL DATA:  Acute onset of epigastric abdominal pain and nausea. Generalized weakness. Diarrhea and vomiting. Initial encounter. EXAM: CT ANGIOGRAPHY  CHEST, ABDOMEN AND PELVIS TECHNIQUE: Multidetector CT imaging through the chest, abdomen and pelvis was performed using the standard protocol during bolus administration of intravenous contrast. Multiplanar reconstructed images and MIPs were obtained and reviewed to evaluate the vascular anatomy. CONTRAST:  100 mL of Isovue 370 IV contrast COMPARISON:  CT of the abdomen and pelvis from 07/05/2012 FINDINGS: CTA CHEST FINDINGS Cardiovascular: There is no evidence of aortic dissection. There is no evidence of aneurysmal dilatation along the thoracic aorta. Scattered calcification is seen along the thoracic aorta and proximal great vessels. The great vessels are otherwise unremarkable in appearance. There is no evidence of significant pulmonary embolus. Diffuse coronary artery calcifications are seen. The heart is otherwise unremarkable. Mediastinum/Nodes: The heart is otherwise unremarkable in appearance. No mediastinal lymphadenopathy is seen. No pericardial effusion is identified. The thyroid gland is unremarkable. No axillary lymphadenopathy is appreciated. Lungs/Pleura: A vague focal 2.1 cm hazy opacity is noted at the left lung apex (image 18 of 58). Mild bibasilar atelectasis is noted. No pleural effusion or pneumothorax is seen. Musculoskeletal: No acute  osseous abnormalities are identified. The visualized musculature is unremarkable in appearance. Review of the MIP images confirms the above findings. CTA ABDOMEN AND PELVIS FINDINGS VASCULAR Aorta: There is aneurysmal dilatation of the abdominal aorta at the level of the renal arteries to 3.2 cm in AP dimension, with an apparent focal right posterior outpouching raising concern for underlying penetrating aortic ulceration at this level. However, this appears relatively stable from 2013. Inferior to the level of the renal arteries, there is dilatation of the abdominal aorta to 3.6 cm in AP and transverse dimensions, which resolves just proximal to the level of the aortic bifurcation. This appears relatively stable from 2013. Underlying diffuse calcification is seen along the abdominal aorta, with minimal associated mural thrombus but no significant luminal narrowing. There is no evidence of aortic dissection. Celiac: There is focal kinking of the proximal celiac trunk, likely secondary to underlying mural thrombus, resulting in moderate to severe focal luminal narrowing. SMA: The superior mesenteric artery appears fully patent, with minimal calcification. Renals: Two renal arteries are noted on each side. These all appear patent, with likely mild luminal narrowing at the origins of both left renal arteries. IMA: The inferior mesenteric artery remains patent. Inflow: Diffuse calcification is seen along the common, internal and external iliac arteries, without significant luminal narrowing. Scattered calcification is seen along the common femoral arteries bilaterally, extending into the superficial femoral arteries. Veins: The visualized venous structures are unremarkable. The inferior vena cava is within normal limits. Review of the MIP images confirms the above findings. NON-VASCULAR Hepatobiliary: The liver is unremarkable in appearance. Trace fluid tracks about the gallbladder. This is thought be secondary to the  process identified at the duodenum, given that the majority of fluid and inflammation is centered about the duodenum pancreatic head. The gallbladder is otherwise grossly unremarkable. The common bile duct remains normal in caliber. Pancreas: Soft tissue inflammation and fluid is noted about the pancreatic head. This could reflect acute pancreatitis. However, the inflammation appears to be centered about the duodenum. Would correlate with the patient's lipase. There is no evidence for devascularization or pseudocyst formation at this time. The remainder of the pancreas is unremarkable. Spleen: The spleen is diminutive and unremarkable in appearance. Adrenals/Urinary Tract: The adrenal glands are unremarkable. A 2.1 cm cyst is noted at the interpole region of the left kidney. Nonspecific perinephric stranding is noted bilaterally. There is no evidence of hydronephrosis. No renal or  ureteral stones are identified. Stomach/Bowel: There are at least three large duodenal diverticula noted tracking about the pancreatic head, the largest of which measures greater than 4 cm and is filled with solid material, with diffuse surrounding soft tissue inflammation and fluid. Fluid tracks superiorly and inferiorly along the duodenum, and anterior to Gerota's fascia on the right. Inflammation also tracks along the mesenteric vasculature. This raises concern for underlying duodenal ulceration, possibly related to the largest diverticulum. There is no definite evidence for bowel perforation at this time. The appendix is normal in caliber, without evidence of appendicitis. Diffuse diverticulosis is noted along the entirety of the colon, without evidence of diverticulitis. Remaining small bowel loops are unremarkable in appearance. The stomach is unremarkable in appearance. Lymphatic: No retroperitoneal or pelvic sidewall lymphadenopathy is seen. Reproductive: The bladder is mildly distended and grossly unremarkable. The prostate is  enlarged, measuring 5.5 cm in transverse dimension, with minimal calcification. This is relatively stable from 2013. Other: A small left inguinal hernia is noted, containing only fat. Musculoskeletal: Mild vacuum phenomenon is noted along the lumbar spine, with underlying facet disease. Anterior bridging osteophytes are seen along the thoracic spine. No acute osseous abnormalities are identified. The visualized musculature is unremarkable in appearance. Review of the MIP images confirms the above findings. IMPRESSION: 1. No evidence of aortic dissection. 2. Aneurysmal dilatation of the abdominal aorta at the level of the renal arteries to 3.2 cm in AP dimension, with an apparent focal right posterior outpouching raising concern for underlying penetrating aortic ulceration at this level. However, this appears relatively stable from 2013. Follow-up could be considered as deemed clinically appropriate. 3. **An incidental finding of potential clinical significance has been found. Aneurysmal dilatation of the infrarenal abdominal aorta to 3.6 cm in AP and transverse dimensions, which resolves just proximal to the aortic bifurcation. This appears relatively stable from 2013. Diffuse aortic atherosclerosis noted. Recommend followup by ultrasound in 2 years. This recommendation follows ACR consensus guidelines: White Paper of the ACR Incidental Findings Committee II on Vascular Findings. J Am Coll Radiol 2013; 10:789-794.** 4. No evidence of significant pulmonary embolus. 5. Focal kinking of the proximal celiac trunk, likely secondary to underlying mural thrombus, resulting in moderate severe focal luminal narrowing. Likely mild luminal narrowing at the origins of the patient's two left renal arteries. 6. At least three large duodenal diverticula noted tracking about the pancreatic head, the largest of which measures more than 4 cm and is filled with solid material, with diffuse surrounding soft tissue inflammation and  fluid. Fluid tracks superiorly and inferiorly along the duodenum, and anterior to Gerota's fascia on the right. Inflammation tracks along the mesenteric vasculature. This is concerning for underlying duodenal ulceration, possibly related to the large diverticulum. No definite evidence for bowel perforation at this time. 7. Soft tissue inflammation and fluid tracks about the pancreatic head, and acute pancreatitis cannot be excluded. However, this appears to be secondary to the process at the duodenum, given that the majority of the fluid and inflammation tracks about the duodenum. Would correlate with the patient's lipase. No evidence of devascularization or pseudocyst formation at this time. Minimal fluid tracks about the gallbladder, likely secondary to the duodenal process. 8. Enlarged prostate noted, measuring 5.5 cm in transverse dimension. This is relatively stable from 2013. 9. **An incidental finding of potential clinical significance has been found. Vague 2.1 cm focal ground-glass opacity at the left lung apex. Initial follow-up with CT at 6-12 months is recommended to confirm persistence. If persistent,  repeat CT is recommended every 2 years until 5 years of stability has been established. This recommendation follows the consensus statement: Guidelines for Management of Incidental Pulmonary Nodules Detected on CT Images: From the Fleischner Society 2017; Radiology 2017; 284:228-243.** 10. Small left inguinal hernia, containing only fat. 11. Diffuse diverticulosis along the entirety of the colon, without evidence of diverticulitis. 12. Left renal cyst noted. 13. Diffuse coronary artery calcifications seen. These results were called by telephone at the time of interpretation on 06/13/2016 at 9:40 pm to Dr. Pattricia Boss, who verbally acknowledged these results. Electronically Signed   By: Garald Balding M.D.   On: 06/13/2016 21:40   Micro Results: No results found for this or any previous visit (from the  past 240 hour(s)). Studies/Results: Ct Angio Chest/abd/pel For Dissection W And/or Wo Contrast  Result Date: 06/13/2016 CLINICAL DATA:  Acute onset of epigastric abdominal pain and nausea. Generalized weakness. Diarrhea and vomiting. Initial encounter. EXAM: CT ANGIOGRAPHY CHEST, ABDOMEN AND PELVIS TECHNIQUE: Multidetector CT imaging through the chest, abdomen and pelvis was performed using the standard protocol during bolus administration of intravenous contrast. Multiplanar reconstructed images and MIPs were obtained and reviewed to evaluate the vascular anatomy. CONTRAST:  100 mL of Isovue 370 IV contrast COMPARISON:  CT of the abdomen and pelvis from 07/05/2012 FINDINGS: CTA CHEST FINDINGS Cardiovascular: There is no evidence of aortic dissection. There is no evidence of aneurysmal dilatation along the thoracic aorta. Scattered calcification is seen along the thoracic aorta and proximal great vessels. The great vessels are otherwise unremarkable in appearance. There is no evidence of significant pulmonary embolus. Diffuse coronary artery calcifications are seen. The heart is otherwise unremarkable. Mediastinum/Nodes: The heart is otherwise unremarkable in appearance. No mediastinal lymphadenopathy is seen. No pericardial effusion is identified. The thyroid gland is unremarkable. No axillary lymphadenopathy is appreciated. Lungs/Pleura: A vague focal 2.1 cm hazy opacity is noted at the left lung apex (image 18 of 58). Mild bibasilar atelectasis is noted. No pleural effusion or pneumothorax is seen. Musculoskeletal: No acute osseous abnormalities are identified. The visualized musculature is unremarkable in appearance. Review of the MIP images confirms the above findings. CTA ABDOMEN AND PELVIS FINDINGS VASCULAR Aorta: There is aneurysmal dilatation of the abdominal aorta at the level of the renal arteries to 3.2 cm in AP dimension, with an apparent focal right posterior outpouching raising concern for  underlying penetrating aortic ulceration at this level. However, this appears relatively stable from 2013. Inferior to the level of the renal arteries, there is dilatation of the abdominal aorta to 3.6 cm in AP and transverse dimensions, which resolves just proximal to the level of the aortic bifurcation. This appears relatively stable from 2013. Underlying diffuse calcification is seen along the abdominal aorta, with minimal associated mural thrombus but no significant luminal narrowing. There is no evidence of aortic dissection. Celiac: There is focal kinking of the proximal celiac trunk, likely secondary to underlying mural thrombus, resulting in moderate to severe focal luminal narrowing. SMA: The superior mesenteric artery appears fully patent, with minimal calcification. Renals: Two renal arteries are noted on each side. These all appear patent, with likely mild luminal narrowing at the origins of both left renal arteries. IMA: The inferior mesenteric artery remains patent. Inflow: Diffuse calcification is seen along the common, internal and external iliac arteries, without significant luminal narrowing. Scattered calcification is seen along the common femoral arteries bilaterally, extending into the superficial femoral arteries. Veins: The visualized venous structures are unremarkable. The inferior vena cava is  within normal limits. Review of the MIP images confirms the above findings. NON-VASCULAR Hepatobiliary: The liver is unremarkable in appearance. Trace fluid tracks about the gallbladder. This is thought be secondary to the process identified at the duodenum, given that the majority of fluid and inflammation is centered about the duodenum pancreatic head. The gallbladder is otherwise grossly unremarkable. The common bile duct remains normal in caliber. Pancreas: Soft tissue inflammation and fluid is noted about the pancreatic head. This could reflect acute pancreatitis. However, the inflammation appears  to be centered about the duodenum. Would correlate with the patient's lipase. There is no evidence for devascularization or pseudocyst formation at this time. The remainder of the pancreas is unremarkable. Spleen: The spleen is diminutive and unremarkable in appearance. Adrenals/Urinary Tract: The adrenal glands are unremarkable. A 2.1 cm cyst is noted at the interpole region of the left kidney. Nonspecific perinephric stranding is noted bilaterally. There is no evidence of hydronephrosis. No renal or ureteral stones are identified. Stomach/Bowel: There are at least three large duodenal diverticula noted tracking about the pancreatic head, the largest of which measures greater than 4 cm and is filled with solid material, with diffuse surrounding soft tissue inflammation and fluid. Fluid tracks superiorly and inferiorly along the duodenum, and anterior to Gerota's fascia on the right. Inflammation also tracks along the mesenteric vasculature. This raises concern for underlying duodenal ulceration, possibly related to the largest diverticulum. There is no definite evidence for bowel perforation at this time. The appendix is normal in caliber, without evidence of appendicitis. Diffuse diverticulosis is noted along the entirety of the colon, without evidence of diverticulitis. Remaining small bowel loops are unremarkable in appearance. The stomach is unremarkable in appearance. Lymphatic: No retroperitoneal or pelvic sidewall lymphadenopathy is seen. Reproductive: The bladder is mildly distended and grossly unremarkable. The prostate is enlarged, measuring 5.5 cm in transverse dimension, with minimal calcification. This is relatively stable from 2013. Other: A small left inguinal hernia is noted, containing only fat. Musculoskeletal: Mild vacuum phenomenon is noted along the lumbar spine, with underlying facet disease. Anterior bridging osteophytes are seen along the thoracic spine. No acute osseous abnormalities are  identified. The visualized musculature is unremarkable in appearance. Review of the MIP images confirms the above findings. IMPRESSION: 1. No evidence of aortic dissection. 2. Aneurysmal dilatation of the abdominal aorta at the level of the renal arteries to 3.2 cm in AP dimension, with an apparent focal right posterior outpouching raising concern for underlying penetrating aortic ulceration at this level. However, this appears relatively stable from 2013. Follow-up could be considered as deemed clinically appropriate. 3. **An incidental finding of potential clinical significance has been found. Aneurysmal dilatation of the infrarenal abdominal aorta to 3.6 cm in AP and transverse dimensions, which resolves just proximal to the aortic bifurcation. This appears relatively stable from 2013. Diffuse aortic atherosclerosis noted. Recommend followup by ultrasound in 2 years. This recommendation follows ACR consensus guidelines: White Paper of the ACR Incidental Findings Committee II on Vascular Findings. J Am Coll Radiol 2013; 10:789-794.** 4. No evidence of significant pulmonary embolus. 5. Focal kinking of the proximal celiac trunk, likely secondary to underlying mural thrombus, resulting in moderate severe focal luminal narrowing. Likely mild luminal narrowing at the origins of the patient's two left renal arteries. 6. At least three large duodenal diverticula noted tracking about the pancreatic head, the largest of which measures more than 4 cm and is filled with solid material, with diffuse surrounding soft tissue inflammation and fluid. Fluid tracks  superiorly and inferiorly along the duodenum, and anterior to Gerota's fascia on the right. Inflammation tracks along the mesenteric vasculature. This is concerning for underlying duodenal ulceration, possibly related to the large diverticulum. No definite evidence for bowel perforation at this time. 7. Soft tissue inflammation and fluid tracks about the pancreatic  head, and acute pancreatitis cannot be excluded. However, this appears to be secondary to the process at the duodenum, given that the majority of the fluid and inflammation tracks about the duodenum. Would correlate with the patient's lipase. No evidence of devascularization or pseudocyst formation at this time. Minimal fluid tracks about the gallbladder, likely secondary to the duodenal process. 8. Enlarged prostate noted, measuring 5.5 cm in transverse dimension. This is relatively stable from 2013. 9. **An incidental finding of potential clinical significance has been found. Vague 2.1 cm focal ground-glass opacity at the left lung apex. Initial follow-up with CT at 6-12 months is recommended to confirm persistence. If persistent, repeat CT is recommended every 2 years until 5 years of stability has been established. This recommendation follows the consensus statement: Guidelines for Management of Incidental Pulmonary Nodules Detected on CT Images: From the Fleischner Society 2017; Radiology 2017; 284:228-243.** 10. Small left inguinal hernia, containing only fat. 11. Diffuse diverticulosis along the entirety of the colon, without evidence of diverticulitis. 12. Left renal cyst noted. 13. Diffuse coronary artery calcifications seen. These results were called by telephone at the time of interpretation on 06/13/2016 at 9:40 pm to Dr. Pattricia Boss, who verbally acknowledged these results. Electronically Signed   By: Garald Balding M.D.   On: 06/13/2016 21:40   Medications:  I have reviewed the patient's current medications Scheduled Meds: . cefTRIAXone (ROCEPHIN)  IV  2 g Intravenous Q24H  . enalapril  10 mg Oral Daily  . pantoprazole (PROTONIX) IV  40 mg Intravenous Q24H   Continuous Infusions: . lactated ringers 100 mL/hr at 06/15/16 0635   PRN Meds:.morphine injection, ondansetron **OR** ondansetron (ZOFRAN) IV   Assessment/Plan: #1. Pancreatitis. Discussed with GI. MRCP plan today. Lipase improved  to 78. Calcium is 8.0. AST has dropped to 66 from 217 and ALT has dropped to 83 from 166. Continue IV fluids but decrease rate. Advance diet per GI.  Leukocytosis improved from 18.7-15.6. Continue Rocephin. #2. Diabetes. Glucose 133. #3. Hypertension. Restart enalapril. #4. Aortic stenosis. Echo earlier this year reveals moderate severity. Asymptomatic. Principal Problem:   Pancreatitis Active Problems:   Diverticulosis of colon without hemorrhage   Type 2 diabetes mellitus with complication (HCC)   S/P drug eluting coronary stent placement   Hypertension   Hyperlipidemia     LOS: 2 days   Jordan [name] 06/15/2016, 7:30 AM

## 2016-06-16 DIAGNOSIS — E118 Type 2 diabetes mellitus with unspecified complications: Secondary | ICD-10-CM

## 2016-06-16 DIAGNOSIS — K5730 Diverticulosis of large intestine without perforation or abscess without bleeding: Secondary | ICD-10-CM

## 2016-06-16 DIAGNOSIS — I10 Essential (primary) hypertension: Secondary | ICD-10-CM

## 2016-06-16 DIAGNOSIS — K851 Biliary acute pancreatitis without necrosis or infection: Secondary | ICD-10-CM

## 2016-06-16 DIAGNOSIS — E785 Hyperlipidemia, unspecified: Secondary | ICD-10-CM

## 2016-06-16 LAB — CBC
HEMATOCRIT: 34.8 % — AB (ref 39.0–52.0)
HEMOGLOBIN: 11.2 g/dL — AB (ref 13.0–17.0)
MCH: 29.7 pg (ref 26.0–34.0)
MCHC: 32.2 g/dL (ref 30.0–36.0)
MCV: 92.3 fL (ref 78.0–100.0)
Platelets: 259 10*3/uL (ref 150–400)
RBC: 3.77 MIL/uL — AB (ref 4.22–5.81)
RDW: 13.2 % (ref 11.5–15.5)
WBC: 13.5 10*3/uL — ABNORMAL HIGH (ref 4.0–10.5)

## 2016-06-16 LAB — BILIRUBIN, FRACTIONATED(TOT/DIR/INDIR)
Bilirubin, Direct: 3.6 mg/dL — ABNORMAL HIGH (ref 0.1–0.5)
Indirect Bilirubin: 1.6 mg/dL — ABNORMAL HIGH (ref 0.3–0.9)
Total Bilirubin: 5.2 mg/dL — ABNORMAL HIGH (ref 0.3–1.2)

## 2016-06-16 LAB — COMPREHENSIVE METABOLIC PANEL
ALT: 75 U/L — AB (ref 17–63)
ANION GAP: 7 (ref 5–15)
AST: 87 U/L — ABNORMAL HIGH (ref 15–41)
Albumin: 2.5 g/dL — ABNORMAL LOW (ref 3.5–5.0)
Alkaline Phosphatase: 103 U/L (ref 38–126)
BUN: 29 mg/dL — ABNORMAL HIGH (ref 6–20)
CHLORIDE: 99 mmol/L — AB (ref 101–111)
CO2: 29 mmol/L (ref 22–32)
Calcium: 8 mg/dL — ABNORMAL LOW (ref 8.9–10.3)
Creatinine, Ser: 0.79 mg/dL (ref 0.61–1.24)
GFR calc non Af Amer: >60 mL/min (ref >60)
Glucose, Bld: 125 mg/dL — ABNORMAL HIGH (ref 65–99)
Potassium: 3.7 mmol/L (ref 3.5–5.1)
SODIUM: 135 mmol/L (ref 135–145)
Total Bilirubin: 4.6 mg/dL — ABNORMAL HIGH (ref 0.3–1.2)
Total Protein: 5.5 g/dL — ABNORMAL LOW (ref 6.5–8.1)

## 2016-06-16 NOTE — Progress Notes (Signed)
Subjective: He states that he feels better. He has less pain. He is having no vomiting. Maximum temperature was 100.1.  Objective: Vital signs in last 24 hours: Vitals:   06/15/16 0517 06/15/16 1404 06/15/16 2103 06/16/16 0447  BP: (!) 158/62 (!) 146/56 (!) 144/53 (!) 141/61  Pulse: 96 83 85 84  Resp: '20 18 18 18  '$ Temp: 99.7 F (37.6 C) 100.1 F (37.8 C) 99.5 F (37.5 C) 98.6 F (37 C)  TempSrc: Oral Oral Oral Oral  SpO2: 96% 93% 96% 95%  Weight:      Height:       Weight change:   Intake/Output Summary (Last 24 hours) at 06/16/16 0745 Last data filed at 06/16/16 0600  Gross per 24 hour  Intake          2725.84 ml  Output              150 ml  Net          2575.84 ml    Physical Exam: Alert and in no distress. No scleral icterus. Lungs clear. Heart regular with a grade 2 systolic murmur. Abdomen mildly tender in the epigastrium. No distention or hepatosplenomegaly. Bowel sounds are present.  Lab Results:    Results for orders placed or performed during the hospital encounter of 06/13/16 (from the past 24 hour(s))  Comprehensive metabolic panel     Status: Abnormal   Collection Time: 06/16/16  4:55 AM  Result Value Ref Range   Sodium 135 135 - 145 mmol/L   Potassium 3.7 3.5 - 5.1 mmol/L   Chloride 99 (L) 101 - 111 mmol/L   CO2 29 22 - 32 mmol/L   Glucose, Bld 125 (H) 65 - 99 mg/dL   BUN 29 (H) 6 - 20 mg/dL   Creatinine, Ser 0.79 0.61 - 1.24 mg/dL   Calcium 8.0 (L) 8.9 - 10.3 mg/dL   Total Protein 5.5 (L) 6.5 - 8.1 g/dL   Albumin 2.5 (L) 3.5 - 5.0 g/dL   AST 87 (H) 15 - 41 U/L   ALT 75 (H) 17 - 63 U/L   Alkaline Phosphatase 103 38 - 126 U/L   Total Bilirubin 4.6 (H) 0.3 - 1.2 mg/dL   GFR calc non Af Amer >60 >60 mL/min   GFR calc Af Amer >60 >60 mL/min   Anion gap 7 5 - 15  CBC     Status: Abnormal   Collection Time: 06/16/16  4:55 AM  Result Value Ref Range   WBC 13.5 (H) 4.0 - 10.5 K/uL   RBC 3.77 (L) 4.22 - 5.81 MIL/uL   Hemoglobin 11.2 (L) 13.0 - 17.0  g/dL   HCT 34.8 (L) 39.0 - 52.0 %   MCV 92.3 78.0 - 100.0 fL   MCH 29.7 26.0 - 34.0 pg   MCHC 32.2 30.0 - 36.0 g/dL   RDW 13.2 11.5 - 15.5 %   Platelets 259 150 - 400 K/uL     ABGS No results for input(s): PHART, PO2ART, TCO2, HCO3 in the last 72 hours.  Invalid input(s): PCO2 CULTURES No results found for this or any previous visit (from the past 240 hour(s)). Studies/Results: Mr 3d Recon At Scanner  Result Date: 06/15/2016 CLINICAL DATA:  82 year old male with upper abdominal pain. EXAM: MRI ABDOMEN WITHOUT AND WITH CONTRAST (INCLUDING MRCP) TECHNIQUE: Multiplanar multisequence MR imaging of the abdomen was performed both before and after the administration of intravenous contrast. Heavily T2-weighted images of the biliary and pancreatic ducts were obtained,  and three-dimensional MRCP images were rendered by post processing. CONTRAST:  37m MULTIHANCE GADOBENATE DIMEGLUMINE 529 MG/ML IV SOLN COMPARISON:  No priors. FINDINGS: Lower chest: Small right pleural effusion. Signal intensity in the dependent right lower lobe likely reflects areas of atelectasis (poorly evaluated by MRI). Trace left pleural effusion lying dependently. Hepatobiliary: No discrete cystic or solid hepatic lesions. MRCP images demonstrate no intrahepatic biliary ductal dilatation. Common bile duct is mildly dilated measuring 7 mm in the porta hepatis, however, this is within normal limits given the patient's advanced age. There are no filling defects within the common bile duct to suggest choledocholithiasis. No filling defects in the gallbladder to suggest gallstones. Gallbladder is normal in appearance. Pancreas: No pancreatic mass. No pancreatic ductal dilatation on MRCP images. As noted on the recent CT examination there are large duodenal diverticulae extending predominantly off the medial aspect of the second portion of the duodenum exerting mass effect upon the lateral aspect of and undersurface of the pancreatic  head. This is poorly evaluated on today's MR examination, but there is a small amount of surrounding peripancreatic fluid adjacent to these duodenal diverticulae, presumably related to acute inflammation. The possibility of a frank contained perforation of the largest of these diverticulae should be considered, as the wall of the diverticulum is imperceptibly thin and there is surrounding fluid adjacent to this. Additionally, there is some mild diffuse enhancement of retroperitoneal fat inferior to the pancreas, particularly in areas adjacent to the inferior aspect of the pancreatic head adjacent to these inflamed diverticulae. The body and tail of the pancreas are essentially unremarkable in appearance. Spleen:  Unremarkable. Adrenals/Urinary Tract: There are several small well-defined T1 hypointense, T2 hyperintense, nonenhancing lesions in the kidneys bilaterally, the largest of which is slightly exophytic measuring 2.1 cm in the lateral aspect of the upper pole of the left kidney, compatible with simple cysts. No suspicious renal lesions are noted. No hydroureteronephrosis in the visualized abdomen. Bilateral perinephric stranding (nonspecific). Bilateral adrenal glands are normal in appearance. Stomach/Bowel: Stomach is unremarkable in appearance. Large duodenal diverticulae are noted predominantly extending from the medial aspect of the second portion of the duodenum, with the largest of these diverticulae measuring up to 5.4 cm. Surrounding inflammation is noted, as above. Visualized portions of the colon are remarkable for numerous colonic diverticulae. Vascular/Lymphatic: Extensive atherosclerosis of the visualized abdominal vasculature, including a 3.7 x 3.4 cm fusiform infrarenal abdominal aortic aneurysm, and a 3.2 x 3.2 cm focal suprarenal abdominal aortic aneurysm where there is a large ulcerated plaque (image 46 of series 22). Circumaortic left renal vein (normal anatomical variant) incidentally  noted. No definite lymphadenopathy noted in the abdomen. Other: Small amount of fluid throughout the retroperitoneum, most evident adjacent to the inferior aspect of the pancreatic head in proximity to the duodenal diverticulae (as above). No significant volume of ascites noted in the visualized portions of the peritoneal cavity. Musculoskeletal: No aggressive osseous lesions are noted in the visualized portions of the skeleton. IMPRESSION: 1. Acute duodenal diverticulitis involving diverticulae from the second portion of the duodenum, as above. The possibility of frank perforation contained in the retroperitoneum is not excluded, and clinical correlation is recommended. 2. Although there is very mild dilatation of the common bile duct, this is within normal limits for the patient's advanced age. No choledocholithiasis or signs to suggest biliary tract obstruction. 3. Aortic atherosclerosis with both suprarenal and infrarenal abdominal aortic aneurysms measuring up to 3.7 x 3.4 cm. Recommend followup by ultrasound in 2 years. This  recommendation follows ACR consensus guidelines: White Paper of the ACR Incidental Findings Committee II on Vascular Findings. J Am Coll Radiol 2013; 10:789-794. 4. Small right and trace left pleural effusions. 5. Additional incidental findings, as above. Electronically Signed   By: Vinnie Langton M.D.   On: 06/15/2016 12:33   Mr Abdomen Mrcp Moise Boring Contast  Result Date: 06/15/2016 CLINICAL DATA:  82 year old male with upper abdominal pain. EXAM: MRI ABDOMEN WITHOUT AND WITH CONTRAST (INCLUDING MRCP) TECHNIQUE: Multiplanar multisequence MR imaging of the abdomen was performed both before and after the administration of intravenous contrast. Heavily T2-weighted images of the biliary and pancreatic ducts were obtained, and three-dimensional MRCP images were rendered by post processing. CONTRAST:  59m MULTIHANCE GADOBENATE DIMEGLUMINE 529 MG/ML IV SOLN COMPARISON:  No priors. FINDINGS:  Lower chest: Small right pleural effusion. Signal intensity in the dependent right lower lobe likely reflects areas of atelectasis (poorly evaluated by MRI). Trace left pleural effusion lying dependently. Hepatobiliary: No discrete cystic or solid hepatic lesions. MRCP images demonstrate no intrahepatic biliary ductal dilatation. Common bile duct is mildly dilated measuring 7 mm in the porta hepatis, however, this is within normal limits given the patient's advanced age. There are no filling defects within the common bile duct to suggest choledocholithiasis. No filling defects in the gallbladder to suggest gallstones. Gallbladder is normal in appearance. Pancreas: No pancreatic mass. No pancreatic ductal dilatation on MRCP images. As noted on the recent CT examination there are large duodenal diverticulae extending predominantly off the medial aspect of the second portion of the duodenum exerting mass effect upon the lateral aspect of and undersurface of the pancreatic head. This is poorly evaluated on today's MR examination, but there is a small amount of surrounding peripancreatic fluid adjacent to these duodenal diverticulae, presumably related to acute inflammation. The possibility of a frank contained perforation of the largest of these diverticulae should be considered, as the wall of the diverticulum is imperceptibly thin and there is surrounding fluid adjacent to this. Additionally, there is some mild diffuse enhancement of retroperitoneal fat inferior to the pancreas, particularly in areas adjacent to the inferior aspect of the pancreatic head adjacent to these inflamed diverticulae. The body and tail of the pancreas are essentially unremarkable in appearance. Spleen:  Unremarkable. Adrenals/Urinary Tract: There are several small well-defined T1 hypointense, T2 hyperintense, nonenhancing lesions in the kidneys bilaterally, the largest of which is slightly exophytic measuring 2.1 cm in the lateral aspect of  the upper pole of the left kidney, compatible with simple cysts. No suspicious renal lesions are noted. No hydroureteronephrosis in the visualized abdomen. Bilateral perinephric stranding (nonspecific). Bilateral adrenal glands are normal in appearance. Stomach/Bowel: Stomach is unremarkable in appearance. Large duodenal diverticulae are noted predominantly extending from the medial aspect of the second portion of the duodenum, with the largest of these diverticulae measuring up to 5.4 cm. Surrounding inflammation is noted, as above. Visualized portions of the colon are remarkable for numerous colonic diverticulae. Vascular/Lymphatic: Extensive atherosclerosis of the visualized abdominal vasculature, including a 3.7 x 3.4 cm fusiform infrarenal abdominal aortic aneurysm, and a 3.2 x 3.2 cm focal suprarenal abdominal aortic aneurysm where there is a large ulcerated plaque (image 46 of series 22). Circumaortic left renal vein (normal anatomical variant) incidentally noted. No definite lymphadenopathy noted in the abdomen. Other: Small amount of fluid throughout the retroperitoneum, most evident adjacent to the inferior aspect of the pancreatic head in proximity to the duodenal diverticulae (as above). No significant volume of ascites noted in  the visualized portions of the peritoneal cavity. Musculoskeletal: No aggressive osseous lesions are noted in the visualized portions of the skeleton. IMPRESSION: 1. Acute duodenal diverticulitis involving diverticulae from the second portion of the duodenum, as above. The possibility of frank perforation contained in the retroperitoneum is not excluded, and clinical correlation is recommended. 2. Although there is very mild dilatation of the common bile duct, this is within normal limits for the patient's advanced age. No choledocholithiasis or signs to suggest biliary tract obstruction. 3. Aortic atherosclerosis with both suprarenal and infrarenal abdominal aortic aneurysms  measuring up to 3.7 x 3.4 cm. Recommend followup by ultrasound in 2 years. This recommendation follows ACR consensus guidelines: White Paper of the ACR Incidental Findings Committee II on Vascular Findings. J Am Coll Radiol 2013; 10:789-794. 4. Small right and trace left pleural effusions. 5. Additional incidental findings, as above. Electronically Signed   By: Vinnie Langton M.D.   On: 06/15/2016 12:33   Micro Results: No results found for this or any previous visit (from the past 240 hour(s)). Studies/Results: Mr 3d Recon At Scanner  Result Date: 06/15/2016 CLINICAL DATA:  82 year old male with upper abdominal pain. EXAM: MRI ABDOMEN WITHOUT AND WITH CONTRAST (INCLUDING MRCP) TECHNIQUE: Multiplanar multisequence MR imaging of the abdomen was performed both before and after the administration of intravenous contrast. Heavily T2-weighted images of the biliary and pancreatic ducts were obtained, and three-dimensional MRCP images were rendered by post processing. CONTRAST:  68m MULTIHANCE GADOBENATE DIMEGLUMINE 529 MG/ML IV SOLN COMPARISON:  No priors. FINDINGS: Lower chest: Small right pleural effusion. Signal intensity in the dependent right lower lobe likely reflects areas of atelectasis (poorly evaluated by MRI). Trace left pleural effusion lying dependently. Hepatobiliary: No discrete cystic or solid hepatic lesions. MRCP images demonstrate no intrahepatic biliary ductal dilatation. Common bile duct is mildly dilated measuring 7 mm in the porta hepatis, however, this is within normal limits given the patient's advanced age. There are no filling defects within the common bile duct to suggest choledocholithiasis. No filling defects in the gallbladder to suggest gallstones. Gallbladder is normal in appearance. Pancreas: No pancreatic mass. No pancreatic ductal dilatation on MRCP images. As noted on the recent CT examination there are large duodenal diverticulae extending predominantly off the medial  aspect of the second portion of the duodenum exerting mass effect upon the lateral aspect of and undersurface of the pancreatic head. This is poorly evaluated on today's MR examination, but there is a small amount of surrounding peripancreatic fluid adjacent to these duodenal diverticulae, presumably related to acute inflammation. The possibility of a frank contained perforation of the largest of these diverticulae should be considered, as the wall of the diverticulum is imperceptibly thin and there is surrounding fluid adjacent to this. Additionally, there is some mild diffuse enhancement of retroperitoneal fat inferior to the pancreas, particularly in areas adjacent to the inferior aspect of the pancreatic head adjacent to these inflamed diverticulae. The body and tail of the pancreas are essentially unremarkable in appearance. Spleen:  Unremarkable. Adrenals/Urinary Tract: There are several small well-defined T1 hypointense, T2 hyperintense, nonenhancing lesions in the kidneys bilaterally, the largest of which is slightly exophytic measuring 2.1 cm in the lateral aspect of the upper pole of the left kidney, compatible with simple cysts. No suspicious renal lesions are noted. No hydroureteronephrosis in the visualized abdomen. Bilateral perinephric stranding (nonspecific). Bilateral adrenal glands are normal in appearance. Stomach/Bowel: Stomach is unremarkable in appearance. Large duodenal diverticulae are noted predominantly extending from the medial aspect  of the second portion of the duodenum, with the largest of these diverticulae measuring up to 5.4 cm. Surrounding inflammation is noted, as above. Visualized portions of the colon are remarkable for numerous colonic diverticulae. Vascular/Lymphatic: Extensive atherosclerosis of the visualized abdominal vasculature, including a 3.7 x 3.4 cm fusiform infrarenal abdominal aortic aneurysm, and a 3.2 x 3.2 cm focal suprarenal abdominal aortic aneurysm where there  is a large ulcerated plaque (image 46 of series 22). Circumaortic left renal vein (normal anatomical variant) incidentally noted. No definite lymphadenopathy noted in the abdomen. Other: Small amount of fluid throughout the retroperitoneum, most evident adjacent to the inferior aspect of the pancreatic head in proximity to the duodenal diverticulae (as above). No significant volume of ascites noted in the visualized portions of the peritoneal cavity. Musculoskeletal: No aggressive osseous lesions are noted in the visualized portions of the skeleton. IMPRESSION: 1. Acute duodenal diverticulitis involving diverticulae from the second portion of the duodenum, as above. The possibility of frank perforation contained in the retroperitoneum is not excluded, and clinical correlation is recommended. 2. Although there is very mild dilatation of the common bile duct, this is within normal limits for the patient's advanced age. No choledocholithiasis or signs to suggest biliary tract obstruction. 3. Aortic atherosclerosis with both suprarenal and infrarenal abdominal aortic aneurysms measuring up to 3.7 x 3.4 cm. Recommend followup by ultrasound in 2 years. This recommendation follows ACR consensus guidelines: White Paper of the ACR Incidental Findings Committee II on Vascular Findings. J Am Coll Radiol 2013; 10:789-794. 4. Small right and trace left pleural effusions. 5. Additional incidental findings, as above. Electronically Signed   By: Vinnie Langton M.D.   On: 06/15/2016 12:33   Mr Abdomen Mrcp Moise Boring Contast  Result Date: 06/15/2016 CLINICAL DATA:  82 year old male with upper abdominal pain. EXAM: MRI ABDOMEN WITHOUT AND WITH CONTRAST (INCLUDING MRCP) TECHNIQUE: Multiplanar multisequence MR imaging of the abdomen was performed both before and after the administration of intravenous contrast. Heavily T2-weighted images of the biliary and pancreatic ducts were obtained, and three-dimensional MRCP images were rendered  by post processing. CONTRAST:  12m MULTIHANCE GADOBENATE DIMEGLUMINE 529 MG/ML IV SOLN COMPARISON:  No priors. FINDINGS: Lower chest: Small right pleural effusion. Signal intensity in the dependent right lower lobe likely reflects areas of atelectasis (poorly evaluated by MRI). Trace left pleural effusion lying dependently. Hepatobiliary: No discrete cystic or solid hepatic lesions. MRCP images demonstrate no intrahepatic biliary ductal dilatation. Common bile duct is mildly dilated measuring 7 mm in the porta hepatis, however, this is within normal limits given the patient's advanced age. There are no filling defects within the common bile duct to suggest choledocholithiasis. No filling defects in the gallbladder to suggest gallstones. Gallbladder is normal in appearance. Pancreas: No pancreatic mass. No pancreatic ductal dilatation on MRCP images. As noted on the recent CT examination there are large duodenal diverticulae extending predominantly off the medial aspect of the second portion of the duodenum exerting mass effect upon the lateral aspect of and undersurface of the pancreatic head. This is poorly evaluated on today's MR examination, but there is a small amount of surrounding peripancreatic fluid adjacent to these duodenal diverticulae, presumably related to acute inflammation. The possibility of a frank contained perforation of the largest of these diverticulae should be considered, as the wall of the diverticulum is imperceptibly thin and there is surrounding fluid adjacent to this. Additionally, there is some mild diffuse enhancement of retroperitoneal fat inferior to the pancreas, particularly in areas adjacent  to the inferior aspect of the pancreatic head adjacent to these inflamed diverticulae. The body and tail of the pancreas are essentially unremarkable in appearance. Spleen:  Unremarkable. Adrenals/Urinary Tract: There are several small well-defined T1 hypointense, T2 hyperintense, nonenhancing  lesions in the kidneys bilaterally, the largest of which is slightly exophytic measuring 2.1 cm in the lateral aspect of the upper pole of the left kidney, compatible with simple cysts. No suspicious renal lesions are noted. No hydroureteronephrosis in the visualized abdomen. Bilateral perinephric stranding (nonspecific). Bilateral adrenal glands are normal in appearance. Stomach/Bowel: Stomach is unremarkable in appearance. Large duodenal diverticulae are noted predominantly extending from the medial aspect of the second portion of the duodenum, with the largest of these diverticulae measuring up to 5.4 cm. Surrounding inflammation is noted, as above. Visualized portions of the colon are remarkable for numerous colonic diverticulae. Vascular/Lymphatic: Extensive atherosclerosis of the visualized abdominal vasculature, including a 3.7 x 3.4 cm fusiform infrarenal abdominal aortic aneurysm, and a 3.2 x 3.2 cm focal suprarenal abdominal aortic aneurysm where there is a large ulcerated plaque (image 46 of series 22). Circumaortic left renal vein (normal anatomical variant) incidentally noted. No definite lymphadenopathy noted in the abdomen. Other: Small amount of fluid throughout the retroperitoneum, most evident adjacent to the inferior aspect of the pancreatic head in proximity to the duodenal diverticulae (as above). No significant volume of ascites noted in the visualized portions of the peritoneal cavity. Musculoskeletal: No aggressive osseous lesions are noted in the visualized portions of the skeleton. IMPRESSION: 1. Acute duodenal diverticulitis involving diverticulae from the second portion of the duodenum, as above. The possibility of frank perforation contained in the retroperitoneum is not excluded, and clinical correlation is recommended. 2. Although there is very mild dilatation of the common bile duct, this is within normal limits for the patient's advanced age. No choledocholithiasis or signs to  suggest biliary tract obstruction. 3. Aortic atherosclerosis with both suprarenal and infrarenal abdominal aortic aneurysms measuring up to 3.7 x 3.4 cm. Recommend followup by ultrasound in 2 years. This recommendation follows ACR consensus guidelines: White Paper of the ACR Incidental Findings Committee II on Vascular Findings. J Am Coll Radiol 2013; 10:789-794. 4. Small right and trace left pleural effusions. 5. Additional incidental findings, as above. Electronically Signed   By: Vinnie Langton M.D.   On: 06/15/2016 12:33   Medications:  I have reviewed the patient's current medications Scheduled Meds: . cefTRIAXone (ROCEPHIN)  IV  2 g Intravenous Q24H  . enalapril  10 mg Oral Daily  . pantoprazole (PROTONIX) IV  40 mg Intravenous Q24H   Continuous Infusions: . lactated ringers 50 mL/hr at 06/15/16 2140   PRN Meds:.morphine injection, ondansetron **OR** ondansetron (ZOFRAN) IV   Assessment/Plan: #1. Biliary pancreatitis.  White count improved to 13.5 ALT improved to 75 while AST is mildly higher at 87. Total bilirubin is increased to 4.6 for unclear reasons. MRCP reveals no choledocholithiasis. Start clear liquids today. #2. Diabetes. Morning glucose is 125. #3. Hypertension. Continue enalapril. Principal Problem:   Pancreatitis Active Problems:   Diverticulosis of colon without hemorrhage   Type 2 diabetes mellitus with complication (HCC)   S/P drug eluting coronary stent placement   Hypertension   Hyperlipidemia     LOS: 3 days   Nitara Szczerba 06/16/2016, 7:45 AM

## 2016-06-16 NOTE — Care Management Note (Addendum)
Case Management Note  Patient Details  Name: Jordan [name] MRN: [id number] Date of Birth: Aug 29, 1922  Subjective/Objective:   Patient adm with pancreatitis. ind with ADL's PTA.                 Action/Plan: Following for CM needs.    Expected Discharge Date:  06/16/16               Expected Discharge Plan:  Home/Self Care  In-House Referral:  NA  Discharge planning Services  CM Consult  Post Acute Care Choice:    Choice offered to:     DME Arranged:    DME Agency:     HH Arranged:    HH Agency:     Status of Service:  In process, will continue to follow  If discussed at Long Length of Stay Meetings, dates discussed:    Additional Comments:  Waseem Suess, [name], RN 06/16/2016, 3:34 PM

## 2016-06-16 NOTE — Progress Notes (Signed)
  Subjective:  Patient states he feels better. His pain has significantly decreased in the last 24 hours. He denies mild pain in midepigastric region. He denies nausea or vomiting. He had no difficulty with thin liquids. He is passing flatus but no BM in last 2 days.   Objective: Blood pressure 138/68, pulse 82, temperature 98.3 F (36.8 C), temperature source Oral, resp. rate 18, height '5\' 7"'$  (1.702 m), weight 143 lb 15.4 oz (65.3 kg), SpO2 94 %. Patient is sitting in recliner appears to be comfortable. Sclera is mildly icteric. Hard palate is nonicteric. Heart exam with regular rhythm normal S1 and S2. Grade 3/6 systolic ejection murmur best at left sternal border. Lungs clear to auscultation. Abdomen. Bowel sounds are normal. Abdomen is soft with mild midepigastric tenderness. No organomegaly or masses. No LE edema or clubbing noted.  Labs/studies Results:   Recent Labs  06/14/16 0450 06/15/16 0458 06/16/16 0455  WBC 18.7* 15.6* 13.5*  HGB 12.7* 11.9* 11.2*  HCT 38.8* 36.0* 34.8*  PLT 325 291 259    BMET   Recent Labs  06/14/16 0450 06/15/16 0458 06/16/16 0455  NA 136 137 135  K 4.2 3.8 3.7  CL 98* 99* 99*  CO2 '27 30 29  '$ GLUCOSE 183* 133* 125*  BUN 25* 29* 29*  CREATININE 1.08 0.95 0.79  CALCIUM 8.4* 8.0* 8.0*    LFT   Recent Labs  06/14/16 0450 06/15/16 0458 06/16/16 0455 06/16/16 0830  PROT 6.2* 5.4* 5.5*  --   ALBUMIN 3.4* 2.8* 2.5*  --   AST 217* 66* 87*  --   ALT 166* 83* 75*  --   ALKPHOS 97 75 103  --   BILITOT 0.7 0.8 4.6* 5.2*  BILIDIR  --   --   --  3.6*  IBILI  --   --   --  1.6*    PT/INR   Recent Labs  06/13/16 1916  LABPROT 12.7  INR 0.95    MRCP. Fatty reviewed with patient and her daughter [name]. No evidence of cholelithiasis or choledocholithiasis. Duodenal diverticulosis and ? Diverticulitis. There is no impingement on bile duct or pancreatic duct. No evidence of pancreatic mass.    Assessment:  #1. Acute pancreatitis.  Etiology felt to be biliary pancreatitis. Significant symptomatic improvement in the last 24 hours he has developed predominantly indirect hyperbilirubinemia. MRCP yesterday was negative for cholelithiasis or dilated bile duct. He has large duodenal diverticula these do not appear to be impinging upon the bowel stuck. He could have SOD dysfunction or microlithiasis. Renal function is well-preserved.  Recommendations:  Repeat LFTs and serum lipase in a.m. If bilirubin keeps rising with proceed with upper abdominal ultrasound and go from there.  I will be back on 06/19/2016 but can be reached on my cell [phone])

## 2016-06-17 ENCOUNTER — Inpatient Hospital Stay (HOSPITAL_COMMUNITY): Payer: Medicare Other

## 2016-06-17 ENCOUNTER — Other Ambulatory Visit: Payer: Self-pay

## 2016-06-17 DIAGNOSIS — K859 Acute pancreatitis without necrosis or infection, unspecified: Principal | ICD-10-CM

## 2016-06-17 DIAGNOSIS — K5730 Diverticulosis of large intestine without perforation or abscess without bleeding: Secondary | ICD-10-CM

## 2016-06-17 LAB — CBC
HEMATOCRIT: 35.2 % — AB (ref 39.0–52.0)
Hemoglobin: 11.6 g/dL — ABNORMAL LOW (ref 13.0–17.0)
MCH: 29.6 pg (ref 26.0–34.0)
MCHC: 33 g/dL (ref 30.0–36.0)
MCV: 89.8 fL (ref 78.0–100.0)
Platelets: 260 10*3/uL (ref 150–400)
RBC: 3.92 MIL/uL — AB (ref 4.22–5.81)
RDW: 12.9 % (ref 11.5–15.5)
WBC: 14.4 10*3/uL — AB (ref 4.0–10.5)

## 2016-06-17 LAB — GLUCOSE, CAPILLARY
GLUCOSE-CAPILLARY: 132 mg/dL — AB (ref 65–99)
GLUCOSE-CAPILLARY: 158 mg/dL — AB (ref 65–99)
Glucose-Capillary: 194 mg/dL — ABNORMAL HIGH (ref 65–99)
Glucose-Capillary: 186 mg/dL — ABNORMAL HIGH (ref 65–99)
Glucose-Capillary: 174 mg/dL — ABNORMAL HIGH (ref 65–99)

## 2016-06-17 LAB — COMPREHENSIVE METABOLIC PANEL
ALT: 208 U/L — AB (ref 17–63)
AST: 362 U/L — AB (ref 15–41)
Albumin: 2.6 g/dL — ABNORMAL LOW (ref 3.5–5.0)
Alkaline Phosphatase: 245 U/L — ABNORMAL HIGH (ref 38–126)
Anion gap: 7 (ref 5–15)
BILIRUBIN TOTAL: 8.2 mg/dL — AB (ref 0.3–1.2)
BUN: 28 mg/dL — AB (ref 6–20)
CHLORIDE: 100 mmol/L — AB (ref 101–111)
CO2: 29 mmol/L (ref 22–32)
CREATININE: 0.62 mg/dL (ref 0.61–1.24)
Calcium: 8.3 mg/dL — ABNORMAL LOW (ref 8.9–10.3)
GFR calc Af Amer: >60 mL/min (ref >60)
GLUCOSE: 188 mg/dL — AB (ref 65–99)
Potassium: 3.5 mmol/L (ref 3.5–5.1)
Sodium: 136 mmol/L (ref 135–145)
TOTAL PROTEIN: 5.9 g/dL — AB (ref 6.5–8.1)

## 2016-06-17 LAB — LIPASE, BLOOD: LIPASE: 27 U/L (ref 11–51)

## 2016-06-17 MED ORDER — ACETAMINOPHEN 325 MG PO TABS
650.0000 mg | ORAL_TABLET | ORAL | Status: DC | PRN
  Administered 2016-06-17: 650 mg via ORAL

## 2016-06-17 MED ORDER — INSULIN ASPART 100 UNIT/ML ~~LOC~~ SOLN
0.0000 [IU] | Freq: Every day | SUBCUTANEOUS | Status: DC
  Administered 2016-06-18: 3 [IU] via SUBCUTANEOUS

## 2016-06-17 MED ORDER — METRONIDAZOLE IVPB CUSTOM
250.0000 mg | Freq: Three times a day (TID) | INTRAVENOUS | Status: DC
  Administered 2016-06-17 – 2016-06-20 (×9): 250 mg via INTRAVENOUS
  Filled 2016-06-17 (×2): qty 100
  Filled 2016-06-17 (×5): qty 50
  Filled 2016-06-17: qty 100
  Filled 2016-06-17 (×3): qty 50
  Filled 2016-06-17: qty 100
  Filled 2016-06-17 (×2): qty 50
  Filled 2016-06-17 (×5): qty 100
  Filled 2016-06-17 (×2): qty 50
  Filled 2016-06-17: qty 100
  Filled 2016-06-17: qty 50

## 2016-06-17 MED ORDER — INSULIN ASPART 100 UNIT/ML ~~LOC~~ SOLN
0.0000 [IU] | Freq: Three times a day (TID) | SUBCUTANEOUS | Status: DC
  Administered 2016-06-17: 2 [IU] via SUBCUTANEOUS
  Administered 2016-06-17: 1 [IU] via SUBCUTANEOUS
  Administered 2016-06-17: 2 [IU] via SUBCUTANEOUS
  Administered 2016-06-18: 3 [IU] via SUBCUTANEOUS
  Administered 2016-06-18 (×2): 2 [IU] via SUBCUTANEOUS
  Administered 2016-06-19 (×2): 3 [IU] via SUBCUTANEOUS
  Administered 2016-06-19: 1 [IU] via SUBCUTANEOUS
  Administered 2016-06-20: 2 [IU] via SUBCUTANEOUS

## 2016-06-17 MED ORDER — VANCOMYCIN HCL IN DEXTROSE 1-5 GM/200ML-% IV SOLN
1000.0000 mg | Freq: Once | INTRAVENOUS | Status: AC
Start: 2016-06-17 — End: 2016-06-17
  Administered 2016-06-17: 1000 mg via INTRAVENOUS
  Filled 2016-06-17: qty 200

## 2016-06-17 MED ORDER — ACETAMINOPHEN 325 MG PO TABS
ORAL_TABLET | ORAL | Status: AC
  Filled 2016-06-17: qty 2

## 2016-06-17 MED ORDER — PIPERACILLIN-TAZOBACTAM 3.375 G IVPB
3.3750 g | Freq: Three times a day (TID) | INTRAVENOUS | Status: DC
  Administered 2016-06-17 – 2016-06-20 (×9): 3.375 g via INTRAVENOUS
  Filled 2016-06-17 (×9): qty 50

## 2016-06-17 MED ORDER — DIATRIZOATE MEGLUMINE & SODIUM 66-10 % PO SOLN
ORAL | Status: AC
  Filled 2016-06-17: qty 30

## 2016-06-17 MED ORDER — IOPAMIDOL (ISOVUE-300) INJECTION 61%
100.0000 mL | Freq: Once | INTRAVENOUS | Status: AC | PRN
  Administered 2016-06-17: 100 mL via INTRAVENOUS

## 2016-06-17 MED ORDER — VANCOMYCIN HCL IN DEXTROSE 750-5 MG/150ML-% IV SOLN
750.0000 mg | Freq: Two times a day (BID) | INTRAVENOUS | Status: DC
  Administered 2016-06-17 – 2016-06-20 (×6): 750 mg via INTRAVENOUS
  Filled 2016-06-17 (×8): qty 150

## 2016-06-17 MED ORDER — POTASSIUM CHLORIDE CRYS ER 20 MEQ PO TBCR
20.0000 meq | EXTENDED_RELEASE_TABLET | Freq: Three times a day (TID) | ORAL | Status: DC
  Administered 2016-06-17 (×3): 20 meq via ORAL
  Filled 2016-06-17 (×3): qty 1

## 2016-06-17 MED ORDER — VANCOMYCIN HCL 500 MG IV SOLR
500.0000 mg | Freq: Once | INTRAVENOUS | Status: DC
  Filled 2016-06-17: qty 500

## 2016-06-17 NOTE — Progress Notes (Signed)
ANTIBIOTIC CONSULT NOTE  Pharmacy Consult for Vancomycin Indication: Pneumonia  No Known Allergies  Patient Measurements: Height: '5\' 7"'$  (170.2 cm) Weight: 143 lb 15.4 oz (65.3 kg) IBW/kg (Calculated) : 66.1  Vital Signs: Temp: 101.9 F (38.8 C) (12/02 0400) Temp Source: Oral (12/02 0400) BP: 127/94 (12/02 0400) Pulse Rate: 101 (12/02 0400)  Labs:  Recent Labs  06/15/16 0458 06/16/16 0455 06/17/16 0444  WBC 15.6* 13.5* 14.4*  HGB 11.9* 11.2* 11.6*  PLT 291 259 260  CREATININE 0.95 0.79 0.62   Estimated Creatinine Clearance: 55.6 mL/min (by C-G formula based on SCr of 0.62 mg/dL).  No results for input(s): VANCOTROUGH, VANCOPEAK, VANCORANDOM, GENTTROUGH, GENTPEAK, GENTRANDOM, TOBRATROUGH, TOBRAPEAK, TOBRARND, AMIKACINPEAK, AMIKACINTROU, AMIKACIN in the last 72 hours.   Microbiology: Recent Results (from the past 720 hour(s))  Culture, blood (Routine X 2) w Reflex to ID Panel     Status: None (Preliminary result)   Collection Time: 06/17/16  4:44 AM  Result Value Ref Range Status   Specimen Description BLOOD BLOOD RIGHT HAND  Final   Special Requests BOTTLES DRAWN AEROBIC AND ANAEROBIC 6CC  Final   Culture PENDING  Incomplete   Report Status PENDING  Incomplete  Culture, blood (Routine X 2) w Reflex to ID Panel     Status: None (Preliminary result)   Collection Time: 06/17/16  4:49 AM  Result Value Ref Range Status   Specimen Description BLOOD BLOOD RIGHT HAND  Final   Special Requests BOTTLES DRAWN AEROBIC AND ANAEROBIC 6CC  Final   Culture PENDING  Incomplete   Report Status PENDING  Incomplete   Medical History: Past Medical History:  Diagnosis Date  . Arthritis   . BPH (benign prostatic hypertrophy)   . CAD (coronary artery disease) no cardiologist--  sees pcp  dr fagan   s/p  PCI and stenting pRCA 2009  . Diverticulosis of colon   . Fecal urgency   . History of adenomatous polyp of colon    tubular adenoma's 05/ 2016  . Hyperlipidemia   . Hypertension    . Lower urinary tract symptoms (LUTS)   . S/P drug eluting coronary stent placement    12-23-2007  pRCA  . Type 2 diabetes mellitus ([place])   . Wears glasses    Medications: Ceftriaxone 2 Gm IV 11/29-12/02/17  Zosyn 3.375 Gm IV every 8 hours 12/2 >> Vancomycin 12/2 >>  Assessment: 82 yo male admitted 11/29.17 with acute pancreatitis. Pt now with acute episode of SOB, elevated temp and tachycardia.  Chest xray shows possible pneumonia. Empiric antibiotics.  Goal of Therapy:  Vancomycin troughs 15-20 mcg/ml  Plan:  Zosyn 3.375gm IV q8h Vancomycin '1000mg'$  x 1 then '750mg'$  IV q12hrs Check trough at steady state Monitor labs, progress, c/s  Hart Robinsons A, RPH 06/17/2016,8:10 AM

## 2016-06-17 NOTE — Progress Notes (Signed)
Reviewed today's CT scan with Dr. Weber Cooks. Nidus of inflammation seems to be arising from a large duodenal diverticulum occluded with a concretion/feculent material. This tic did not fill at all with the oral contrast although contrast freely flowed down into the duodenum. It is felt the patient's primary process may be more duodenal diverticulitis rather than pancreatitis primarily. Again, the bile duct was visualized. Not dilated and does not appear to be extrinsically compressed.  I discussed with Dr. Willey Blade. We'll have Dr. Arnoldo Morale render an opinion. Will continue antibiotics;  recheck LFTs tomorrow morning.  I'm not sure whether directly visualizing the opening to this large duodenal diverticulum with a scope and attempting to debride its contents has a role here or not at this time.  Further recommendations to follow.

## 2016-06-17 NOTE — Progress Notes (Signed)
Subjective: He developed a fever to 101.9 overnight. He had associated shortness of breath. Chest x-ray reveals possible by basilar pneumonia. He denies cough however. Oxygen saturation is 94% on 2 L. He has minimal appetite. He denies any increased abdominal pain.  Objective: Vital signs in last 24 hours: Vitals:   06/16/16 0447 06/16/16 1420 06/16/16 2049 06/17/16 0400  BP: (!) 141/61 138/68 (!) 157/56 (!) 127/94  Pulse: 84 82 73 (!) 101  Resp: '18 18 18 '$ (!) 22  Temp: 98.6 F (37 C) 98.3 F (36.8 C) 99.5 F (37.5 C) (!) 101.9 F (38.8 C)  TempSrc: Oral Oral Oral Oral  SpO2: 95% 94% 94% 92%  Weight:      Height:       Weight change:   Intake/Output Summary (Last 24 hours) at 06/17/16 1607 Last data filed at 06/16/16 1800  Gross per 24 hour  Intake              500 ml  Output              500 ml  Net                0 ml    Physical Exam: Breathing comfortably. No distress. No JVD. Lungs reveal minimal basilar rales. Heart regular with a grade 2 systolic murmur. Abdomen soft with minimal epigastric tenderness. No hepatosplenomegaly. Extremities reveal no edema. Neuro stable.  Lab Results:    Results for orders placed or performed during the hospital encounter of 06/13/16 (from the past 24 hour(s))  Bilirubin, fractionated(tot/dir/indir)     Status: Abnormal   Collection Time: 06/16/16  8:30 AM  Result Value Ref Range   Total Bilirubin 5.2 (H) 0.3 - 1.2 mg/dL   Bilirubin, Direct 3.6 (H) 0.1 - 0.5 mg/dL   Indirect Bilirubin 1.6 (H) 0.3 - 0.9 mg/dL  Glucose, capillary     Status: Abnormal   Collection Time: 06/17/16  3:46 AM  Result Value Ref Range   Glucose-Capillary 194 (H) 65 - 99 mg/dL  CBC     Status: Abnormal   Collection Time: 06/17/16  4:44 AM  Result Value Ref Range   WBC 14.4 (H) 4.0 - 10.5 K/uL   RBC 3.92 (L) 4.22 - 5.81 MIL/uL   Hemoglobin 11.6 (L) 13.0 - 17.0 g/dL   HCT 35.2 (L) 39.0 - 52.0 %   MCV 89.8 78.0 - 100.0 fL   MCH 29.6 26.0 - 34.0 pg   MCHC  33.0 30.0 - 36.0 g/dL   RDW 12.9 11.5 - 15.5 %   Platelets 260 150 - 400 K/uL  Comprehensive metabolic panel     Status: Abnormal   Collection Time: 06/17/16  4:44 AM  Result Value Ref Range   Sodium 136 135 - 145 mmol/L   Potassium 3.5 3.5 - 5.1 mmol/L   Chloride 100 (L) 101 - 111 mmol/L   CO2 29 22 - 32 mmol/L   Glucose, Bld 188 (H) 65 - 99 mg/dL   BUN 28 (H) 6 - 20 mg/dL   Creatinine, Ser 0.62 0.61 - 1.24 mg/dL   Calcium 8.3 (L) 8.9 - 10.3 mg/dL   Total Protein 5.9 (L) 6.5 - 8.1 g/dL   Albumin 2.6 (L) 3.5 - 5.0 g/dL   AST 362 (H) 15 - 41 U/L   ALT 208 (H) 17 - 63 U/L   Alkaline Phosphatase 245 (H) 38 - 126 U/L   Total Bilirubin 8.2 (H) 0.3 - 1.2 mg/dL   GFR  calc non Af Amer >60 >60 mL/min   GFR calc Af Amer >60 >60 mL/min   Anion gap 7 5 - 15  Lipase, blood     Status: None   Collection Time: 06/17/16  4:44 AM  Result Value Ref Range   Lipase 27 11 - 51 U/L     ABGS No results for input(s): PHART, PO2ART, TCO2, HCO3 in the last 72 hours.  Invalid input(s): PCO2 CULTURES No results found for this or any previous visit (from the past 240 hour(s)). Studies/Results: Mr 3d Recon At Scanner  Result Date: 06/15/2016 CLINICAL DATA:  82 year old male with upper abdominal pain. EXAM: MRI ABDOMEN WITHOUT AND WITH CONTRAST (INCLUDING MRCP) TECHNIQUE: Multiplanar multisequence MR imaging of the abdomen was performed both before and after the administration of intravenous contrast. Heavily T2-weighted images of the biliary and pancreatic ducts were obtained, and three-dimensional MRCP images were rendered by post processing. CONTRAST:  41m MULTIHANCE GADOBENATE DIMEGLUMINE 529 MG/ML IV SOLN COMPARISON:  No priors. FINDINGS: Lower chest: Small right pleural effusion. Signal intensity in the dependent right lower lobe likely reflects areas of atelectasis (poorly evaluated by MRI). Trace left pleural effusion lying dependently. Hepatobiliary: No discrete cystic or solid hepatic lesions.  MRCP images demonstrate no intrahepatic biliary ductal dilatation. Common bile duct is mildly dilated measuring 7 mm in the porta hepatis, however, this is within normal limits given the patient's advanced age. There are no filling defects within the common bile duct to suggest choledocholithiasis. No filling defects in the gallbladder to suggest gallstones. Gallbladder is normal in appearance. Pancreas: No pancreatic mass. No pancreatic ductal dilatation on MRCP images. As noted on the recent CT examination there are large duodenal diverticulae extending predominantly off the medial aspect of the second portion of the duodenum exerting mass effect upon the lateral aspect of and undersurface of the pancreatic head. This is poorly evaluated on today's MR examination, but there is a small amount of surrounding peripancreatic fluid adjacent to these duodenal diverticulae, presumably related to acute inflammation. The possibility of a frank contained perforation of the largest of these diverticulae should be considered, as the wall of the diverticulum is imperceptibly thin and there is surrounding fluid adjacent to this. Additionally, there is some mild diffuse enhancement of retroperitoneal fat inferior to the pancreas, particularly in areas adjacent to the inferior aspect of the pancreatic head adjacent to these inflamed diverticulae. The body and tail of the pancreas are essentially unremarkable in appearance. Spleen:  Unremarkable. Adrenals/Urinary Tract: There are several small well-defined T1 hypointense, T2 hyperintense, nonenhancing lesions in the kidneys bilaterally, the largest of which is slightly exophytic measuring 2.1 cm in the lateral aspect of the upper pole of the left kidney, compatible with simple cysts. No suspicious renal lesions are noted. No hydroureteronephrosis in the visualized abdomen. Bilateral perinephric stranding (nonspecific). Bilateral adrenal glands are normal in appearance.  Stomach/Bowel: Stomach is unremarkable in appearance. Large duodenal diverticulae are noted predominantly extending from the medial aspect of the second portion of the duodenum, with the largest of these diverticulae measuring up to 5.4 cm. Surrounding inflammation is noted, as above. Visualized portions of the colon are remarkable for numerous colonic diverticulae. Vascular/Lymphatic: Extensive atherosclerosis of the visualized abdominal vasculature, including a 3.7 x 3.4 cm fusiform infrarenal abdominal aortic aneurysm, and a 3.2 x 3.2 cm focal suprarenal abdominal aortic aneurysm where there is a large ulcerated plaque (image 46 of series 22). Circumaortic left renal vein (normal anatomical variant) incidentally noted. No definite lymphadenopathy  noted in the abdomen. Other: Small amount of fluid throughout the retroperitoneum, most evident adjacent to the inferior aspect of the pancreatic head in proximity to the duodenal diverticulae (as above). No significant volume of ascites noted in the visualized portions of the peritoneal cavity. Musculoskeletal: No aggressive osseous lesions are noted in the visualized portions of the skeleton. IMPRESSION: 1. Acute duodenal diverticulitis involving diverticulae from the second portion of the duodenum, as above. The possibility of frank perforation contained in the retroperitoneum is not excluded, and clinical correlation is recommended. 2. Although there is very mild dilatation of the common bile duct, this is within normal limits for the patient's advanced age. No choledocholithiasis or signs to suggest biliary tract obstruction. 3. Aortic atherosclerosis with both suprarenal and infrarenal abdominal aortic aneurysms measuring up to 3.7 x 3.4 cm. Recommend followup by ultrasound in 2 years. This recommendation follows ACR consensus guidelines: White Paper of the ACR Incidental Findings Committee II on Vascular Findings. J Am Coll Radiol 2013; 10:789-794. 4. Small right  and trace left pleural effusions. 5. Additional incidental findings, as above. Electronically Signed   By: Vinnie Langton M.D.   On: 06/15/2016 12:33   Dg Chest Port 1 View  Result Date: 06/17/2016 CLINICAL DATA:  Acute onset of shortness of breath and diaphoresis. Fever and tachycardia. Initial encounter. EXAM: PORTABLE CHEST 1 VIEW COMPARISON:  CTA of the chest performed 06/13/2016 FINDINGS: The lungs are well-aerated. Mild vascular congestion is noted. Bibasilar airspace opacities raise concern for pneumonia. There is no evidence of pleural effusion or pneumothorax. The cardiomediastinal silhouette is borderline normal in size. No acute osseous abnormalities are seen. IMPRESSION: Mild vascular congestion. Bibasilar airspace opacities raise concern for pneumonia. Electronically Signed   By: Garald Balding M.D.   On: 06/17/2016 05:18   Mr Abdomen Mrcp Moise Boring Contast  Result Date: 06/15/2016 CLINICAL DATA:  83 year old male with upper abdominal pain. EXAM: MRI ABDOMEN WITHOUT AND WITH CONTRAST (INCLUDING MRCP) TECHNIQUE: Multiplanar multisequence MR imaging of the abdomen was performed both before and after the administration of intravenous contrast. Heavily T2-weighted images of the biliary and pancreatic ducts were obtained, and three-dimensional MRCP images were rendered by post processing. CONTRAST:  45m MULTIHANCE GADOBENATE DIMEGLUMINE 529 MG/ML IV SOLN COMPARISON:  No priors. FINDINGS: Lower chest: Small right pleural effusion. Signal intensity in the dependent right lower lobe likely reflects areas of atelectasis (poorly evaluated by MRI). Trace left pleural effusion lying dependently. Hepatobiliary: No discrete cystic or solid hepatic lesions. MRCP images demonstrate no intrahepatic biliary ductal dilatation. Common bile duct is mildly dilated measuring 7 mm in the porta hepatis, however, this is within normal limits given the patient's advanced age. There are no filling defects within the common  bile duct to suggest choledocholithiasis. No filling defects in the gallbladder to suggest gallstones. Gallbladder is normal in appearance. Pancreas: No pancreatic mass. No pancreatic ductal dilatation on MRCP images. As noted on the recent CT examination there are large duodenal diverticulae extending predominantly off the medial aspect of the second portion of the duodenum exerting mass effect upon the lateral aspect of and undersurface of the pancreatic head. This is poorly evaluated on today's MR examination, but there is a small amount of surrounding peripancreatic fluid adjacent to these duodenal diverticulae, presumably related to acute inflammation. The possibility of a frank contained perforation of the largest of these diverticulae should be considered, as the wall of the diverticulum is imperceptibly thin and there is surrounding fluid adjacent to this. Additionally, there  is some mild diffuse enhancement of retroperitoneal fat inferior to the pancreas, particularly in areas adjacent to the inferior aspect of the pancreatic head adjacent to these inflamed diverticulae. The body and tail of the pancreas are essentially unremarkable in appearance. Spleen:  Unremarkable. Adrenals/Urinary Tract: There are several small well-defined T1 hypointense, T2 hyperintense, nonenhancing lesions in the kidneys bilaterally, the largest of which is slightly exophytic measuring 2.1 cm in the lateral aspect of the upper pole of the left kidney, compatible with simple cysts. No suspicious renal lesions are noted. No hydroureteronephrosis in the visualized abdomen. Bilateral perinephric stranding (nonspecific). Bilateral adrenal glands are normal in appearance. Stomach/Bowel: Stomach is unremarkable in appearance. Large duodenal diverticulae are noted predominantly extending from the medial aspect of the second portion of the duodenum, with the largest of these diverticulae measuring up to 5.4 cm. Surrounding inflammation is  noted, as above. Visualized portions of the colon are remarkable for numerous colonic diverticulae. Vascular/Lymphatic: Extensive atherosclerosis of the visualized abdominal vasculature, including a 3.7 x 3.4 cm fusiform infrarenal abdominal aortic aneurysm, and a 3.2 x 3.2 cm focal suprarenal abdominal aortic aneurysm where there is a large ulcerated plaque (image 46 of series 22). Circumaortic left renal vein (normal anatomical variant) incidentally noted. No definite lymphadenopathy noted in the abdomen. Other: Small amount of fluid throughout the retroperitoneum, most evident adjacent to the inferior aspect of the pancreatic head in proximity to the duodenal diverticulae (as above). No significant volume of ascites noted in the visualized portions of the peritoneal cavity. Musculoskeletal: No aggressive osseous lesions are noted in the visualized portions of the skeleton. IMPRESSION: 1. Acute duodenal diverticulitis involving diverticulae from the second portion of the duodenum, as above. The possibility of frank perforation contained in the retroperitoneum is not excluded, and clinical correlation is recommended. 2. Although there is very mild dilatation of the common bile duct, this is within normal limits for the patient's advanced age. No choledocholithiasis or signs to suggest biliary tract obstruction. 3. Aortic atherosclerosis with both suprarenal and infrarenal abdominal aortic aneurysms measuring up to 3.7 x 3.4 cm. Recommend followup by ultrasound in 2 years. This recommendation follows ACR consensus guidelines: White Paper of the ACR Incidental Findings Committee II on Vascular Findings. J Am Coll Radiol 2013; 10:789-794. 4. Small right and trace left pleural effusions. 5. Additional incidental findings, as above. Electronically Signed   By: Vinnie Langton M.D.   On: 06/15/2016 12:33   Micro Results: No results found for this or any previous visit (from the past 240  hour(s)). Studies/Results: Mr 3d Recon At Scanner  Result Date: 06/15/2016 CLINICAL DATA:  82 year old male with upper abdominal pain. EXAM: MRI ABDOMEN WITHOUT AND WITH CONTRAST (INCLUDING MRCP) TECHNIQUE: Multiplanar multisequence MR imaging of the abdomen was performed both before and after the administration of intravenous contrast. Heavily T2-weighted images of the biliary and pancreatic ducts were obtained, and three-dimensional MRCP images were rendered by post processing. CONTRAST:  57m MULTIHANCE GADOBENATE DIMEGLUMINE 529 MG/ML IV SOLN COMPARISON:  No priors. FINDINGS: Lower chest: Small right pleural effusion. Signal intensity in the dependent right lower lobe likely reflects areas of atelectasis (poorly evaluated by MRI). Trace left pleural effusion lying dependently. Hepatobiliary: No discrete cystic or solid hepatic lesions. MRCP images demonstrate no intrahepatic biliary ductal dilatation. Common bile duct is mildly dilated measuring 7 mm in the porta hepatis, however, this is within normal limits given the patient's advanced age. There are no filling defects within the common bile duct to suggest choledocholithiasis.  No filling defects in the gallbladder to suggest gallstones. Gallbladder is normal in appearance. Pancreas: No pancreatic mass. No pancreatic ductal dilatation on MRCP images. As noted on the recent CT examination there are large duodenal diverticulae extending predominantly off the medial aspect of the second portion of the duodenum exerting mass effect upon the lateral aspect of and undersurface of the pancreatic head. This is poorly evaluated on today's MR examination, but there is a small amount of surrounding peripancreatic fluid adjacent to these duodenal diverticulae, presumably related to acute inflammation. The possibility of a frank contained perforation of the largest of these diverticulae should be considered, as the wall of the diverticulum is imperceptibly thin and  there is surrounding fluid adjacent to this. Additionally, there is some mild diffuse enhancement of retroperitoneal fat inferior to the pancreas, particularly in areas adjacent to the inferior aspect of the pancreatic head adjacent to these inflamed diverticulae. The body and tail of the pancreas are essentially unremarkable in appearance. Spleen:  Unremarkable. Adrenals/Urinary Tract: There are several small well-defined T1 hypointense, T2 hyperintense, nonenhancing lesions in the kidneys bilaterally, the largest of which is slightly exophytic measuring 2.1 cm in the lateral aspect of the upper pole of the left kidney, compatible with simple cysts. No suspicious renal lesions are noted. No hydroureteronephrosis in the visualized abdomen. Bilateral perinephric stranding (nonspecific). Bilateral adrenal glands are normal in appearance. Stomach/Bowel: Stomach is unremarkable in appearance. Large duodenal diverticulae are noted predominantly extending from the medial aspect of the second portion of the duodenum, with the largest of these diverticulae measuring up to 5.4 cm. Surrounding inflammation is noted, as above. Visualized portions of the colon are remarkable for numerous colonic diverticulae. Vascular/Lymphatic: Extensive atherosclerosis of the visualized abdominal vasculature, including a 3.7 x 3.4 cm fusiform infrarenal abdominal aortic aneurysm, and a 3.2 x 3.2 cm focal suprarenal abdominal aortic aneurysm where there is a large ulcerated plaque (image 46 of series 22). Circumaortic left renal vein (normal anatomical variant) incidentally noted. No definite lymphadenopathy noted in the abdomen. Other: Small amount of fluid throughout the retroperitoneum, most evident adjacent to the inferior aspect of the pancreatic head in proximity to the duodenal diverticulae (as above). No significant volume of ascites noted in the visualized portions of the peritoneal cavity. Musculoskeletal: No aggressive osseous  lesions are noted in the visualized portions of the skeleton. IMPRESSION: 1. Acute duodenal diverticulitis involving diverticulae from the second portion of the duodenum, as above. The possibility of frank perforation contained in the retroperitoneum is not excluded, and clinical correlation is recommended. 2. Although there is very mild dilatation of the common bile duct, this is within normal limits for the patient's advanced age. No choledocholithiasis or signs to suggest biliary tract obstruction. 3. Aortic atherosclerosis with both suprarenal and infrarenal abdominal aortic aneurysms measuring up to 3.7 x 3.4 cm. Recommend followup by ultrasound in 2 years. This recommendation follows ACR consensus guidelines: White Paper of the ACR Incidental Findings Committee II on Vascular Findings. J Am Coll Radiol 2013; 10:789-794. 4. Small right and trace left pleural effusions. 5. Additional incidental findings, as above. Electronically Signed   By: Vinnie Langton M.D.   On: 06/15/2016 12:33   Dg Chest Port 1 View  Result Date: 06/17/2016 CLINICAL DATA:  Acute onset of shortness of breath and diaphoresis. Fever and tachycardia. Initial encounter. EXAM: PORTABLE CHEST 1 VIEW COMPARISON:  CTA of the chest performed 06/13/2016 FINDINGS: The lungs are well-aerated. Mild vascular congestion is noted. Bibasilar airspace opacities raise concern  for pneumonia. There is no evidence of pleural effusion or pneumothorax. The cardiomediastinal silhouette is borderline normal in size. No acute osseous abnormalities are seen. IMPRESSION: Mild vascular congestion. Bibasilar airspace opacities raise concern for pneumonia. Electronically Signed   By: Garald Balding M.D.   On: 06/17/2016 05:18   Mr Abdomen Mrcp Moise Boring Contast  Result Date: 06/15/2016 CLINICAL DATA:  82 year old male with upper abdominal pain. EXAM: MRI ABDOMEN WITHOUT AND WITH CONTRAST (INCLUDING MRCP) TECHNIQUE: Multiplanar multisequence MR imaging of the  abdomen was performed both before and after the administration of intravenous contrast. Heavily T2-weighted images of the biliary and pancreatic ducts were obtained, and three-dimensional MRCP images were rendered by post processing. CONTRAST:  68m MULTIHANCE GADOBENATE DIMEGLUMINE 529 MG/ML IV SOLN COMPARISON:  No priors. FINDINGS: Lower chest: Small right pleural effusion. Signal intensity in the dependent right lower lobe likely reflects areas of atelectasis (poorly evaluated by MRI). Trace left pleural effusion lying dependently. Hepatobiliary: No discrete cystic or solid hepatic lesions. MRCP images demonstrate no intrahepatic biliary ductal dilatation. Common bile duct is mildly dilated measuring 7 mm in the porta hepatis, however, this is within normal limits given the patient's advanced age. There are no filling defects within the common bile duct to suggest choledocholithiasis. No filling defects in the gallbladder to suggest gallstones. Gallbladder is normal in appearance. Pancreas: No pancreatic mass. No pancreatic ductal dilatation on MRCP images. As noted on the recent CT examination there are large duodenal diverticulae extending predominantly off the medial aspect of the second portion of the duodenum exerting mass effect upon the lateral aspect of and undersurface of the pancreatic head. This is poorly evaluated on today's MR examination, but there is a small amount of surrounding peripancreatic fluid adjacent to these duodenal diverticulae, presumably related to acute inflammation. The possibility of a frank contained perforation of the largest of these diverticulae should be considered, as the wall of the diverticulum is imperceptibly thin and there is surrounding fluid adjacent to this. Additionally, there is some mild diffuse enhancement of retroperitoneal fat inferior to the pancreas, particularly in areas adjacent to the inferior aspect of the pancreatic head adjacent to these inflamed  diverticulae. The body and tail of the pancreas are essentially unremarkable in appearance. Spleen:  Unremarkable. Adrenals/Urinary Tract: There are several small well-defined T1 hypointense, T2 hyperintense, nonenhancing lesions in the kidneys bilaterally, the largest of which is slightly exophytic measuring 2.1 cm in the lateral aspect of the upper pole of the left kidney, compatible with simple cysts. No suspicious renal lesions are noted. No hydroureteronephrosis in the visualized abdomen. Bilateral perinephric stranding (nonspecific). Bilateral adrenal glands are normal in appearance. Stomach/Bowel: Stomach is unremarkable in appearance. Large duodenal diverticulae are noted predominantly extending from the medial aspect of the second portion of the duodenum, with the largest of these diverticulae measuring up to 5.4 cm. Surrounding inflammation is noted, as above. Visualized portions of the colon are remarkable for numerous colonic diverticulae. Vascular/Lymphatic: Extensive atherosclerosis of the visualized abdominal vasculature, including a 3.7 x 3.4 cm fusiform infrarenal abdominal aortic aneurysm, and a 3.2 x 3.2 cm focal suprarenal abdominal aortic aneurysm where there is a large ulcerated plaque (image 46 of series 22). Circumaortic left renal vein (normal anatomical variant) incidentally noted. No definite lymphadenopathy noted in the abdomen. Other: Small amount of fluid throughout the retroperitoneum, most evident adjacent to the inferior aspect of the pancreatic head in proximity to the duodenal diverticulae (as above). No significant volume of ascites noted in the  visualized portions of the peritoneal cavity. Musculoskeletal: No aggressive osseous lesions are noted in the visualized portions of the skeleton. IMPRESSION: 1. Acute duodenal diverticulitis involving diverticulae from the second portion of the duodenum, as above. The possibility of frank perforation contained in the retroperitoneum is  not excluded, and clinical correlation is recommended. 2. Although there is very mild dilatation of the common bile duct, this is within normal limits for the patient's advanced age. No choledocholithiasis or signs to suggest biliary tract obstruction. 3. Aortic atherosclerosis with both suprarenal and infrarenal abdominal aortic aneurysms measuring up to 3.7 x 3.4 cm. Recommend followup by ultrasound in 2 years. This recommendation follows ACR consensus guidelines: White Paper of the ACR Incidental Findings Committee II on Vascular Findings. J Am Coll Radiol 2013; 10:789-794. 4. Small right and trace left pleural effusions. 5. Additional incidental findings, as above. Electronically Signed   By: Vinnie Langton M.D.   On: 06/15/2016 12:33   Medications:  I have reviewed the patient's current medications Scheduled Meds: . acetaminophen      . enalapril  10 mg Oral Daily  . insulin aspart  0-5 Units Subcutaneous QHS  . insulin aspart  0-9 Units Subcutaneous TID WC  . pantoprazole (PROTONIX) IV  40 mg Intravenous Q24H  . piperacillin-tazobactam (ZOSYN)  IV  3.375 g Intravenous Q8H  . potassium chloride  20 mEq Oral TID   Continuous Infusions: . lactated ringers 50 mL/hr at 06/15/16 2140   PRN Meds:.acetaminophen, morphine injection, ondansetron **OR** ondansetron (ZOFRAN) IV   Assessment/Plan: #1. Acute pancreatitis. Bilirubin is now up to 8.2 with significant transaminase elevations and an elevated alkaline phosphatase. May need ERCP. Will discuss with GI. #2. Worsening fever with possible pneumonia. Modify antibiotics from Rocephin to Zosyn and vancomycin. Blood cultures pending. White count 14.4. #3. Diabetes. Follow before meals and at bedtime Accu-Cheks and treat with sliding scale NovoLog as needed. Glucose 188. Principal Problem:   Pancreatitis Active Problems:   Diverticulosis of colon without hemorrhage   Type 2 diabetes mellitus with complication (HCC)   S/P drug eluting coronary  stent placement   Hypertension   Hyperlipidemia     LOS: 4 days   Uyen Eichholz 06/17/2016, 6:28 AM

## 2016-06-17 NOTE — Progress Notes (Signed)
Pt called to nursing station requesting PRN pain medication for abdominal pain.  Pt reports pain 6/10 and also notes right arm pain associated with both PIV sites.  PIV removed x 2 and new IV placed in pt LFA.  Warm compress applied to both old IV sites.  Pt given PRN via new IV.  Pt states he is already feeling some better and appreciative for my help.  Pt RN M. Cullen notified of findings and new IV placement.

## 2016-06-17 NOTE — Progress Notes (Signed)
ANTIBIOTIC CONSULT NOTE-Preliminary  Pharmacy Consult for Vancomycin Indication: Pneumonia  No Known Allergies  Patient Measurements: Height: '5\' 7"'$  (170.2 cm) Weight: 143 lb 15.4 oz (65.3 kg) IBW/kg (Calculated) : 66.1   Vital Signs: Temp: 101.9 F (38.8 C) (12/02 0400) Temp Source: Oral (12/02 0400) BP: 127/94 (12/02 0400) Pulse Rate: 101 (12/02 0400)  Labs:  Recent Labs  06/15/16 0458 06/16/16 0455 06/17/16 0444  WBC 15.6* 13.5* 14.4*  HGB 11.9* 11.2* 11.6*  PLT 291 259 260  CREATININE 0.95 0.79 0.62    Estimated Creatinine Clearance: 55.6 mL/min (by C-G formula based on SCr of 0.62 mg/dL).  No results for input(s): VANCOTROUGH, VANCOPEAK, VANCORANDOM, GENTTROUGH, GENTPEAK, GENTRANDOM, TOBRATROUGH, TOBRAPEAK, TOBRARND, AMIKACINPEAK, AMIKACINTROU, AMIKACIN in the last 72 hours.   Microbiology: No results found for this or any previous visit (from the past 720 hour(s)).  Medical History: Past Medical History:  Diagnosis Date  . Arthritis   . BPH (benign prostatic hypertrophy)   . CAD (coronary artery disease) no cardiologist--  sees pcp  dr fagan   s/p  PCI and stenting pRCA 2009  . Diverticulosis of colon   . Fecal urgency   . History of adenomatous polyp of colon    tubular adenoma's 05/ 2016  . Hyperlipidemia   . Hypertension   . Lower urinary tract symptoms (LUTS)   . S/P drug eluting coronary stent placement    12-23-2007  pRCA  . Type 2 diabetes mellitus ([place])   . Wears glasses     Medications: Ceftriaxone 2 Gm IV 11/29-12/02/17  Zosyn 3.375 Gm IV every 8 hours  Assessment: 82 yo male admitted 11/29.17 with acute pancreatitis. Pt now with acute episode of SOB, elevated temp and tachycardia. Chest xray shows possible pneumonia. Empiric antibiotics.  Goal of Therapy:  Vancomycin troughs 15-20 mcg/ml  Plan:  Preliminary review of pertinent patient information completed.  Protocol will be initiated with a one-time dose of Vancomycin 500 mg IV.   Forestine Na clinical pharmacist will complete review during morning rounds to assess patient and finalize treatment regimen.  Norberto Sorenson, [facility] 06/17/2016,6:34 AM

## 2016-06-17 NOTE — Progress Notes (Signed)
Patient called nursing stating  he was sob. Om arrival  to room patient appears diaphoretic. Vitals taken BP @ 127/94 TEMP 101.9 SATS 91 RA HR 101. Dr Daiva Huge paged with above findings orders given to initiate chest xray , blood cultures and medicate with tylenol  PO

## 2016-06-17 NOTE — Progress Notes (Signed)
Dr. Willey Blade called me about this patient this morning. Working diagnosis biliary pancreatitis. Developed bed shaking rigors about 2 AM this morning; spiked a temperature to over 101.  Hepatic profile significantly worse this morning with a total bilirubin of 8.2 , alkaline phosphatase 245, ALT and AST 208 and 362, respectively.  Also, I have reviewed recent imaging. CT scan revealed complicated findings in the vicinity of the duodenum and pancreas. MRCP demonstrates no biliary dilation, no impingement/obstruction or common duct stone.  Early bilateral pneumonia suggested on plain films.  Vital signs in last 24 hours: Temp:  [98.3 F (36.8 C)-101.9 F (38.8 C)] 101.9 F (38.8 C) (12/02 0400) Pulse Rate:  [73-101] 101 (12/02 0400) Resp:  [18-22] 22 (12/02 0400) BP: (127-157)/(56-94) 127/94 (12/02 0400) SpO2:  [92 %-94 %] 92 % (12/02 0400) Last BM Date: 06/13/16 General:   Patient sitting up in bed. Jaundiced. Alert and conversant without appearing to be in any distress whatsoever. Accompanied by his son.  Abdomen:  Nondistended. Positive bowel sounds. Minimal epigastric tenderness to palpation. No appreciable mass or organomegaly Extremities:  Without clubbing or edema.    Intake/Output from previous day: 12/01 0701 - 12/02 0700 In: 500 [I.V.:450; IV Piggyback:50] Out: 500 [Urine:500] Intake/Output this shift: No intake/output data recorded.  Lab Results:  Recent Labs  06/15/16 0458 06/16/16 0455 06/17/16 0444  WBC 15.6* 13.5* 14.4*  HGB 11.9* 11.2* 11.6*  HCT 36.0* 34.8* 35.2*  PLT 291 259 260   BMET  Recent Labs  06/15/16 0458 06/16/16 0455 06/17/16 0444  NA 137 135 136  K 3.8 3.7 3.5  CL 99* 99* 100*  CO2 '30 29 29  '$ GLUCOSE 133* 125* 188*  BUN 29* 29* 28*  CREATININE 0.95 0.79 0.62  CALCIUM 8.0* 8.0* 8.3*   LFT  Recent Labs  06/16/16 0830 06/17/16 0444  PROT  --  5.9*  ALBUMIN  --  2.6*  AST  --  362*  ALT  --  208*  ALKPHOS  --  245*  BILITOT 5.2*  8.2*  BILIDIR 3.6*  --   IBILI 1.6*  --    PT/INR No results for input(s): LABPROT, INR in the last 72 hours. Hepatitis Panel No results for input(s): HEPBSAG, HCVAB, HEPAIGM, HEPBIGM in the last 72 hours. C-Diff No results for input(s): CDIFFTOX in the last 72 hours.  Studies/Results: Mr 3d Recon At Scanner  Result Date: 06/15/2016 CLINICAL DATA:  82 year old male with upper abdominal pain. EXAM: MRI ABDOMEN WITHOUT AND WITH CONTRAST (INCLUDING MRCP) TECHNIQUE: Multiplanar multisequence MR imaging of the abdomen was performed both before and after the administration of intravenous contrast. Heavily T2-weighted images of the biliary and pancreatic ducts were obtained, and three-dimensional MRCP images were rendered by post processing. CONTRAST:  47m MULTIHANCE GADOBENATE DIMEGLUMINE 529 MG/ML IV SOLN COMPARISON:  No priors. FINDINGS: Lower chest: Small right pleural effusion. Signal intensity in the dependent right lower lobe likely reflects areas of atelectasis (poorly evaluated by MRI). Trace left pleural effusion lying dependently. Hepatobiliary: No discrete cystic or solid hepatic lesions. MRCP images demonstrate no intrahepatic biliary ductal dilatation. Common bile duct is mildly dilated measuring 7 mm in the porta hepatis, however, this is within normal limits given the patient's advanced age. There are no filling defects within the common bile duct to suggest choledocholithiasis. No filling defects in the gallbladder to suggest gallstones. Gallbladder is normal in appearance. Pancreas: No pancreatic mass. No pancreatic ductal dilatation on MRCP images. As noted on the recent CT examination there  are large duodenal diverticulae extending predominantly off the medial aspect of the second portion of the duodenum exerting mass effect upon the lateral aspect of and undersurface of the pancreatic head. This is poorly evaluated on today's MR examination, but there is a small amount of surrounding  peripancreatic fluid adjacent to these duodenal diverticulae, presumably related to acute inflammation. The possibility of a frank contained perforation of the largest of these diverticulae should be considered, as the wall of the diverticulum is imperceptibly thin and there is surrounding fluid adjacent to this. Additionally, there is some mild diffuse enhancement of retroperitoneal fat inferior to the pancreas, particularly in areas adjacent to the inferior aspect of the pancreatic head adjacent to these inflamed diverticulae. The body and tail of the pancreas are essentially unremarkable in appearance. Spleen:  Unremarkable. Adrenals/Urinary Tract: There are several small well-defined T1 hypointense, T2 hyperintense, nonenhancing lesions in the kidneys bilaterally, the largest of which is slightly exophytic measuring 2.1 cm in the lateral aspect of the upper pole of the left kidney, compatible with simple cysts. No suspicious renal lesions are noted. No hydroureteronephrosis in the visualized abdomen. Bilateral perinephric stranding (nonspecific). Bilateral adrenal glands are normal in appearance. Stomach/Bowel: Stomach is unremarkable in appearance. Large duodenal diverticulae are noted predominantly extending from the medial aspect of the second portion of the duodenum, with the largest of these diverticulae measuring up to 5.4 cm. Surrounding inflammation is noted, as above. Visualized portions of the colon are remarkable for numerous colonic diverticulae. Vascular/Lymphatic: Extensive atherosclerosis of the visualized abdominal vasculature, including a 3.7 x 3.4 cm fusiform infrarenal abdominal aortic aneurysm, and a 3.2 x 3.2 cm focal suprarenal abdominal aortic aneurysm where there is a large ulcerated plaque (image 46 of series 22). Circumaortic left renal vein (normal anatomical variant) incidentally noted. No definite lymphadenopathy noted in the abdomen. Other: Small amount of fluid throughout the  retroperitoneum, most evident adjacent to the inferior aspect of the pancreatic head in proximity to the duodenal diverticulae (as above). No significant volume of ascites noted in the visualized portions of the peritoneal cavity. Musculoskeletal: No aggressive osseous lesions are noted in the visualized portions of the skeleton. IMPRESSION: 1. Acute duodenal diverticulitis involving diverticulae from the second portion of the duodenum, as above. The possibility of frank perforation contained in the retroperitoneum is not excluded, and clinical correlation is recommended. 2. Although there is very mild dilatation of the common bile duct, this is within normal limits for the patient's advanced age. No choledocholithiasis or signs to suggest biliary tract obstruction. 3. Aortic atherosclerosis with both suprarenal and infrarenal abdominal aortic aneurysms measuring up to 3.7 x 3.4 cm. Recommend followup by ultrasound in 2 years. This recommendation follows ACR consensus guidelines: White Paper of the ACR Incidental Findings Committee II on Vascular Findings. J Am Coll Radiol 2013; 10:789-794. 4. Small right and trace left pleural effusions. 5. Additional incidental findings, as above. Electronically Signed   By: Vinnie Langton M.D.   On: 06/15/2016 12:33   Dg Chest Port 1 View  Result Date: 06/17/2016 CLINICAL DATA:  Acute onset of shortness of breath and diaphoresis. Fever and tachycardia. Initial encounter. EXAM: PORTABLE CHEST 1 VIEW COMPARISON:  CTA of the chest performed 06/13/2016 FINDINGS: The lungs are well-aerated. Mild vascular congestion is noted. Bibasilar airspace opacities raise concern for pneumonia. There is no evidence of pleural effusion or pneumothorax. The cardiomediastinal silhouette is borderline normal in size. No acute osseous abnormalities are seen. IMPRESSION: Mild vascular congestion. Bibasilar airspace opacities raise  concern for pneumonia. Electronically Signed   By: Garald Balding  M.D.   On: 06/17/2016 05:18   Mr Abdomen Mrcp Moise Boring Contast  Result Date: 06/15/2016 CLINICAL DATA:  82 year old male with upper abdominal pain. EXAM: MRI ABDOMEN WITHOUT AND WITH CONTRAST (INCLUDING MRCP) TECHNIQUE: Multiplanar multisequence MR imaging of the abdomen was performed both before and after the administration of intravenous contrast. Heavily T2-weighted images of the biliary and pancreatic ducts were obtained, and three-dimensional MRCP images were rendered by post processing. CONTRAST:  51m MULTIHANCE GADOBENATE DIMEGLUMINE 529 MG/ML IV SOLN COMPARISON:  No priors. FINDINGS: Lower chest: Small right pleural effusion. Signal intensity in the dependent right lower lobe likely reflects areas of atelectasis (poorly evaluated by MRI). Trace left pleural effusion lying dependently. Hepatobiliary: No discrete cystic or solid hepatic lesions. MRCP images demonstrate no intrahepatic biliary ductal dilatation. Common bile duct is mildly dilated measuring 7 mm in the porta hepatis, however, this is within normal limits given the patient's advanced age. There are no filling defects within the common bile duct to suggest choledocholithiasis. No filling defects in the gallbladder to suggest gallstones. Gallbladder is normal in appearance. Pancreas: No pancreatic mass. No pancreatic ductal dilatation on MRCP images. As noted on the recent CT examination there are large duodenal diverticulae extending predominantly off the medial aspect of the second portion of the duodenum exerting mass effect upon the lateral aspect of and undersurface of the pancreatic head. This is poorly evaluated on today's MR examination, but there is a small amount of surrounding peripancreatic fluid adjacent to these duodenal diverticulae, presumably related to acute inflammation. The possibility of a frank contained perforation of the largest of these diverticulae should be considered, as the wall of the diverticulum is imperceptibly  thin and there is surrounding fluid adjacent to this. Additionally, there is some mild diffuse enhancement of retroperitoneal fat inferior to the pancreas, particularly in areas adjacent to the inferior aspect of the pancreatic head adjacent to these inflamed diverticulae. The body and tail of the pancreas are essentially unremarkable in appearance. Spleen:  Unremarkable. Adrenals/Urinary Tract: There are several small well-defined T1 hypointense, T2 hyperintense, nonenhancing lesions in the kidneys bilaterally, the largest of which is slightly exophytic measuring 2.1 cm in the lateral aspect of the upper pole of the left kidney, compatible with simple cysts. No suspicious renal lesions are noted. No hydroureteronephrosis in the visualized abdomen. Bilateral perinephric stranding (nonspecific). Bilateral adrenal glands are normal in appearance. Stomach/Bowel: Stomach is unremarkable in appearance. Large duodenal diverticulae are noted predominantly extending from the medial aspect of the second portion of the duodenum, with the largest of these diverticulae measuring up to 5.4 cm. Surrounding inflammation is noted, as above. Visualized portions of the colon are remarkable for numerous colonic diverticulae. Vascular/Lymphatic: Extensive atherosclerosis of the visualized abdominal vasculature, including a 3.7 x 3.4 cm fusiform infrarenal abdominal aortic aneurysm, and a 3.2 x 3.2 cm focal suprarenal abdominal aortic aneurysm where there is a large ulcerated plaque (image 46 of series 22). Circumaortic left renal vein (normal anatomical variant) incidentally noted. No definite lymphadenopathy noted in the abdomen. Other: Small amount of fluid throughout the retroperitoneum, most evident adjacent to the inferior aspect of the pancreatic head in proximity to the duodenal diverticulae (as above). No significant volume of ascites noted in the visualized portions of the peritoneal cavity. Musculoskeletal: No aggressive  osseous lesions are noted in the visualized portions of the skeleton. IMPRESSION: 1. Acute duodenal diverticulitis involving diverticulae from the second portion of  the duodenum, as above. The possibility of frank perforation contained in the retroperitoneum is not excluded, and clinical correlation is recommended. 2. Although there is very mild dilatation of the common bile duct, this is within normal limits for the patient's advanced age. No choledocholithiasis or signs to suggest biliary tract obstruction. 3. Aortic atherosclerosis with both suprarenal and infrarenal abdominal aortic aneurysms measuring up to 3.7 x 3.4 cm. Recommend followup by ultrasound in 2 years. This recommendation follows ACR consensus guidelines: White Paper of the ACR Incidental Findings Committee II on Vascular Findings. J Am Coll Radiol 2013; 10:789-794. 4. Small right and trace left pleural effusions. 5. Additional incidental findings, as above. Electronically Signed   By: Vinnie Langton M.D.   On: 06/15/2016 12:33     Impression:  Peripancreatic/duodenal inflammation with large duodenal diverticula. Both bilirubin and LFTs have significantly increased.  Fever and Rigors overnight Clinical picture most suggestive of extrahepatic biliary obstruction. MRCP does not corroborate an obstructing process although sensitivity not 100%.  Clinically, does not have cholangitis.  Recommendations: Continue antibiotics. Make him nothing by mouth for now. Let's repeat CT scan (pancreatic protocol) to see what, if anything has changed. Broaden antibiotic coverage with the addition of metronidazole.  Further recommendations to follow. Recommendations follow  I have discussed my impression and recommendations with both Drs. Fagan and [place].  Extensive discussion with the patient and patient's son.

## 2016-06-18 DIAGNOSIS — K8500 Idiopathic acute pancreatitis without necrosis or infection: Secondary | ICD-10-CM

## 2016-06-18 LAB — CBC
HCT: 32.6 % — ABNORMAL LOW (ref 39.0–52.0)
Hemoglobin: 10.6 g/dL — ABNORMAL LOW (ref 13.0–17.0)
MCH: 29.3 pg (ref 26.0–34.0)
MCHC: 32.5 g/dL (ref 30.0–36.0)
MCV: 90.1 fL (ref 78.0–100.0)
PLATELETS: 265 10*3/uL (ref 150–400)
RBC: 3.62 MIL/uL — AB (ref 4.22–5.81)
RDW: 13.2 % (ref 11.5–15.5)
WBC: 13.2 10*3/uL — AB (ref 4.0–10.5)

## 2016-06-18 LAB — GLUCOSE, CAPILLARY
GLUCOSE-CAPILLARY: 210 mg/dL — AB (ref 65–99)
GLUCOSE-CAPILLARY: 203 mg/dL — AB (ref 65–99)
Glucose-Capillary: 171 mg/dL — ABNORMAL HIGH (ref 65–99)
Glucose-Capillary: 169 mg/dL — ABNORMAL HIGH (ref 65–99)

## 2016-06-18 LAB — COMPREHENSIVE METABOLIC PANEL
ALT: 218 U/L — AB (ref 17–63)
AST: 254 U/L — AB (ref 15–41)
Albumin: 2.2 g/dL — ABNORMAL LOW (ref 3.5–5.0)
Alkaline Phosphatase: 216 U/L — ABNORMAL HIGH (ref 38–126)
Anion gap: 8 (ref 5–15)
BUN: 32 mg/dL — ABNORMAL HIGH (ref 6–20)
CHLORIDE: 100 mmol/L — AB (ref 101–111)
CO2: 26 mmol/L (ref 22–32)
CREATININE: 0.87 mg/dL (ref 0.61–1.24)
Calcium: 7.8 mg/dL — ABNORMAL LOW (ref 8.9–10.3)
GFR calc non Af Amer: >60 mL/min (ref >60)
Glucose, Bld: 178 mg/dL — ABNORMAL HIGH (ref 65–99)
POTASSIUM: 4 mmol/L (ref 3.5–5.1)
SODIUM: 134 mmol/L — AB (ref 135–145)
Total Bilirubin: 3.7 mg/dL — ABNORMAL HIGH (ref 0.3–1.2)
Total Protein: 5.2 g/dL — ABNORMAL LOW (ref 6.5–8.1)

## 2016-06-18 NOTE — Progress Notes (Signed)
Patient has done well overnight. No abdominal pain. Patient seen by Dr. Arnoldo Morale. Diet advance to clear liquids  Bilirubin has plummeted to 3.7 this morning  Case discussed with Dr. Arnoldo Morale.  Vital signs in last 24 hours: Temp:  [98 F (36.7 C)-99.1 F (37.3 C)] 98 F (36.7 C) (12/03 9381) Pulse Rate:  [70-99] 73 (12/03 0608) Resp:  [20] 20 (12/03 0608) BP: (132-144)/(51-89) 144/58 (12/03 0608) SpO2:  [94 %-96 %] 96 % (12/03 0608) Last BM Date: 06/15/16 General:  Awake alert appears comfortable. Less jaundiced to me today.   Abdomen:  Nondistended. Positive bowel sounds. Soft and nontender. . Extremities:  Without clubbing or edema.    Intake/Output from previous day: 12/02 0701 - 12/03 0700 In: 1281.7 [I.V.:1181.7; IV Piggyback:100] Out: 800 [Urine:800] Intake/Output this shift: No intake/output data recorded.  Lab Results:  Recent Labs  06/16/16 0455 06/17/16 0444 06/18/16 0631  WBC 13.5* 14.4* 13.2*  HGB 11.2* 11.6* 10.6*  HCT 34.8* 35.2* 32.6*  PLT 259 260 265   BMET  Recent Labs  06/16/16 0455 06/17/16 0444 06/18/16 0631  NA 135 136 134*  K 3.7 3.5 4.0  CL 99* 100* 100*  CO2 '29 29 26  '$ GLUCOSE 125* 188* 178*  BUN 29* 28* 32*  CREATININE 0.79 0.62 0.87  CALCIUM 8.0* 8.3* 7.8*   LFT  Recent Labs  06/16/16 0830  06/18/16 0631  PROT  --   < > 5.2*  ALBUMIN  --   < > 2.2*  AST  --   < > 254*  ALT  --   < > 218*  ALKPHOS  --   < > 216*  BILITOT 5.2*  < > 3.7*  BILIDIR 3.6*  --   --   IBILI 1.6*  --   --   < > = values in this interval not displayed. PT/INR No results for input(s): LABPROT, INR in the last 72 hours. Hepatitis Panel No results for input(s): HEPBSAG, HCVAB, HEPAIGM, HEPBIGM in the last 72 hours. C-Diff No results for input(s): CDIFFTOX in the last 72 hours.  Studies/Results: Ct Abdomen Pelvis W Contrast  Result Date: 06/17/2016 CLINICAL DATA:  82 year old male with history of elevated liver function tests, fever and abdominal  pain for the past 2 weeks. EXAM: CT ABDOMEN AND PELVIS WITH CONTRAST TECHNIQUE: Multidetector CT imaging of the abdomen and pelvis was performed using the standard protocol following bolus administration of intravenous contrast. CONTRAST:  183m ISOVUE-300 IOPAMIDOL (ISOVUE-300) INJECTION 61% COMPARISON:  CT of the thorax 06/05/2016 (which covered portions of the upper abdomen). MRI of the abdomen with and without IV gadolinium with MRCP on 06/15/2016. FINDINGS: Lower chest: Small right pleural effusion and trace left pleural effusion layering dependently. Areas of dependent subsegmental atelectasis are noted in the lower lobes of the lungs bilaterally. Hepatobiliary: Diffuse heterogeneous per fusion throughout the liver is again noted, and nonspecific. No discrete cystic or solid hepatic lesions. No intrahepatic biliary ductal dilatation. Common bile duct measures up to 9 mm in the porta hepatis, which is within normal limits for the patient's advanced age. Distal common bile duct is displaced anteriorly secondary to mass effect from the patient's large duodenal diverticulae. No gallstones are identified. Gallbladder is unremarkable in appearance. No definite choledocholithiasis. Pancreas: There are inflammatory changes adjacent to the inferior aspect of the pancreatic head and uncinate process, likely reactive to the adjacent inflammation associated with the duodenal diverticuli. Remaining portions of the body and tail of the pancreas are otherwise unremarkable in  appearance. No pancreatic mass. No discrete pancreatic or peripancreatic fluid collections. No pancreatic ductal dilatation. Spleen: Unremarkable. Adrenals/Urinary Tract: 2.2 cm low-attenuation lesion in the upper pole of the left kidney is compatible with a simple cyst. Right kidney and bilateral adrenal glands are normal in appearance. No hydroureteronephrosis. Urinary bladder is normal in appearance. Stomach/Bowel: Normal appearance of the stomach.  There are 2 duodenal diverticulae. The first of these appears to arise from the duodenal bulb or the very proximal aspect of the second portion of the duodenum, best appreciated on image 30 of series 2. This diverticulum is filled with a combination of oral contrast material and some gas. This extends around the medial margin of the distal common bile duct, best appreciated on image 32 of series 2. There is also a very large diverticulum extending off the medial aspect of the second portion of the duodenum more distally, which is best appreciated on axial image 37 of series 2 and coronal image 34 of series 4 where this diverticulum measures up to 4.1 x 5.1 x 4.1 cm. This diverticulum is filled with a combination of fluid and what appears to be feculent material. Notably, there is no oral contrast material within the lumen of this diverticulum, despite abundant contrast material in the distal small bowel and colon. The feculent material in this diverticulum appears to be potentially impacted in the neck of the diverticulum, best appreciated on coronal images 30 and 31 of series 4. This may indicate complete obstruction of the neck of the diverticulum. This diverticulum is surrounded by extensive inflammatory changes throughout the adjacent retroperitoneum, and appears to be the epicenter of the inflammatory process in this patient. This inflammation extends up into the porta hepatis and cephalad adjacent to the inferior vena cava. No pathologic dilatation of more distal aspects of the small bowel or colon. Numerous colonic diverticulae are noted, without surrounding inflammatory changes to suggest an acute colonic diverticulitis at this time. Normal appendix. Vascular/Lymphatic: Aortic atherosclerosis, with fusiform aneurysmal dilatation of the infrarenal abdominal aorta which measures up to 3.8 x 3.7 cm (image 32 of series 2). No lymphadenopathy noted in the abdomen or pelvis. Reproductive: Prostate gland and  seminal vesicles are unremarkable in appearance. Other: Small left inguinal hernia containing only fat. No significant volume of ascites. No pneumoperitoneum. Musculoskeletal: There are no aggressive appearing lytic or blastic lesions noted in the visualized portions of the skeleton. IMPRESSION: 1. Acute duodenal diverticulitis redemonstrated, as above. This appears centered around the larger of the two duodenal diverticulae which arises from the medial aspect of the distal second portion of the duodenum. This larger diverticulum appears potentially obstructed by some internal feculent material which may be impacted in the neck of the diverticulum. There are extensive surrounding inflammatory changes. No oral contrast material is noted outside the confines of the small bowel in this region (although if the diverticulum is obstructed, no oral contrast material would be expected to extravasation). 2. Inflammatory changes are noted adjacent to the inferior aspect of the head and uncinate process of the pancreas, as well as extending up into the porta hepatis and cephalad along the course of the inferior vena cava. Although there may be a mild secondary pancreatitis, this is likely reactive to the duodenal diverticulitis. 3. Small right and trace left pleural effusions with dependent areas of subsegmental atelectasis in the lung bases bilaterally. 4. Aortic atherosclerosis, in addition to right coronary artery disease. In addition, there is an infrarenal abdominal aortic aneurysm which measures up to  3.8 x 3.7 cm. Recommend followup by ultrasound in 2 years. This recommendation follows ACR consensus guidelines: White Paper of the ACR Incidental Findings Committee II on Vascular Findings. J Am Coll Radiol 2013; 10:789-794. 5. Colonic diverticulosis without evidence to suggest an acute diverticulitis at this time. 6. Small left inguinal hernia containing only fat. 7. Additional incidental findings, as above. These results  were discussed by telephone at the time of interpretation on 06/17/2016 at 1:33 pm to Dr. Manus Rudd, who verbally acknowledged these results. Electronically Signed   By: Vinnie Langton M.D.   On: 06/17/2016 13:53   Dg Chest Port 1 View  Result Date: 06/17/2016 CLINICAL DATA:  Acute onset of shortness of breath and diaphoresis. Fever and tachycardia. Initial encounter. EXAM: PORTABLE CHEST 1 VIEW COMPARISON:  CTA of the chest performed 06/13/2016 FINDINGS: The lungs are well-aerated. Mild vascular congestion is noted. Bibasilar airspace opacities raise concern for pneumonia. There is no evidence of pleural effusion or pneumothorax. The cardiomediastinal silhouette is borderline normal in size. No acute osseous abnormalities are seen. IMPRESSION: Mild vascular congestion. Bibasilar airspace opacities raise concern for pneumonia. Electronically Signed   By: Garald Balding M.D.   On: 06/17/2016 05:18    Impression:  He looks much better this morning. No abdominal pain. Significant improvement in bilirubin. Appreciate Dr. Arnoldo Morale input.  Fluctuating LFTs in all likelihood related to varying degrees of extrinsic compression related to the inflammatory process.   Recommendations: I agree with advancement to clear liquids. If tolerated, will advance to a carbohydrate modified diet later today.  I will, however, make him nothing by mouth past midnight.  Dr. Laural Golden will resume seeing him tomorrow morning.  If biliary decompression is needed, I would favor ERCP with stent placement over a cholecystostomy tube as an initial strategy. Might also consider EGD with assessment of the large duodenal diverticulum at some point to see if it is conducive to debridement/flushing out.  I have discussed this approach with the patient including the risks, benefits and alternatives.  Recheck hepatic profile tomorrow morning  As a separate issue, patient continues to complain of rather unusual anorectal symptoms  now for well over a year without much improvement in spite of fairly extensive evaluation / management  here and elsewhere.  Further evaluation of these complaints per Dr. Laural Golden as he deems appropriate.

## 2016-06-18 NOTE — Progress Notes (Signed)
Subjective:  He is not having problems with pain now. He has had a few doses of Tylenol only over the past day. He has not had a recurrent fever over the last 24 hours. He feels that his appetite is better. His bowels have not moved. He denies any vomiting.  Objective: Vital signs in last 24 hours: Vitals:   06/17/16 0400 06/17/16 1400 06/17/16 2117 06/18/16 0608  BP: (!) 127/94 132/89 (!) 136/51 (!) 144/58  Pulse: (!) 101 99 70 73  Resp: (!) '22 20 20 20  '$ Temp: (!) 101.9 F (38.8 C) 99.1 F (37.3 C) 98 F (36.7 C) 98 F (36.7 C)  TempSrc: Oral  Oral Oral  SpO2: 92% 94% 95% 96%  Weight:      Height:       Weight change:   Intake/Output Summary (Last 24 hours) at 06/18/16 0928 Last data filed at 06/18/16 8032  Gross per 24 hour  Intake          1281.67 ml  Output              800 ml  Net           481.67 ml    Physical Exam: Alert. No distress. Jaundiced. Lungs clear. Heart regular with a grade 3 systolic murmur. Abdomen soft and minimally tender in the epigastrium. No hepatosplenomegaly or mass. Bowel sounds are present. Extremities reveal no edema.  Lab Results:    Results for orders placed or performed during the hospital encounter of 06/13/16 (from the past 24 hour(s))  Glucose, capillary     Status: Abnormal   Collection Time: 06/17/16 11:53 AM  Result Value Ref Range   Glucose-Capillary 174 (H) 65 - 99 mg/dL  Glucose, capillary     Status: Abnormal   Collection Time: 06/17/16  5:28 PM  Result Value Ref Range   Glucose-Capillary 132 (H) 65 - 99 mg/dL   Comment 1 Notify RN    Comment 2 Document in Chart   Glucose, capillary     Status: Abnormal   Collection Time: 06/17/16  9:23 PM  Result Value Ref Range   Glucose-Capillary 158 (H) 65 - 99 mg/dL   Comment 1 Notify RN    Comment 2 Document in Chart   CBC     Status: Abnormal   Collection Time: 06/18/16  6:31 AM  Result Value Ref Range   WBC 13.2 (H) 4.0 - 10.5 K/uL   RBC 3.62 (L) 4.22 - 5.81 MIL/uL    Hemoglobin 10.6 (L) 13.0 - 17.0 g/dL   HCT 32.6 (L) 39.0 - 52.0 %   MCV 90.1 78.0 - 100.0 fL   MCH 29.3 26.0 - 34.0 pg   MCHC 32.5 30.0 - 36.0 g/dL   RDW 13.2 11.5 - 15.5 %   Platelets 265 150 - 400 K/uL  Comprehensive metabolic panel Once     Status: Abnormal   Collection Time: 06/18/16  6:31 AM  Result Value Ref Range   Sodium 134 (L) 135 - 145 mmol/L   Potassium 4.0 3.5 - 5.1 mmol/L   Chloride 100 (L) 101 - 111 mmol/L   CO2 26 22 - 32 mmol/L   Glucose, Bld 178 (H) 65 - 99 mg/dL   BUN 32 (H) 6 - 20 mg/dL   Creatinine, Ser 0.87 0.61 - 1.24 mg/dL   Calcium 7.8 (L) 8.9 - 10.3 mg/dL   Total Protein 5.2 (L) 6.5 - 8.1 g/dL   Albumin 2.2 (L) 3.5 - 5.0  g/dL   AST 254 (H) 15 - 41 U/L   ALT 218 (H) 17 - 63 U/L   Alkaline Phosphatase 216 (H) 38 - 126 U/L   Total Bilirubin 3.7 (H) 0.3 - 1.2 mg/dL   GFR calc non Af Amer >60 >60 mL/min   GFR calc Af Amer >60 >60 mL/min   Anion gap 8 5 - 15  Glucose, capillary     Status: Abnormal   Collection Time: 06/18/16  7:35 AM  Result Value Ref Range   Glucose-Capillary 171 (H) 65 - 99 mg/dL   Comment 1 Notify RN      ABGS No results for input(s): PHART, PO2ART, TCO2, HCO3 in the last 72 hours.  Invalid input(s): PCO2 CULTURES Recent Results (from the past 240 hour(s))  Culture, blood (Routine X 2) w Reflex to ID Panel     Status: None (Preliminary result)   Collection Time: 06/17/16  4:44 AM  Result Value Ref Range Status   Specimen Description BLOOD BLOOD RIGHT HAND  Final   Special Requests BOTTLES DRAWN AEROBIC AND ANAEROBIC 6CC  Final   Culture PENDING  Incomplete   Report Status PENDING  Incomplete  Culture, blood (Routine X 2) w Reflex to ID Panel     Status: None (Preliminary result)   Collection Time: 06/17/16  4:49 AM  Result Value Ref Range Status   Specimen Description BLOOD BLOOD RIGHT HAND  Final   Special Requests BOTTLES DRAWN AEROBIC AND ANAEROBIC 6CC  Final   Culture PENDING  Incomplete   Report Status PENDING   Incomplete   Studies/Results: Ct Abdomen Pelvis W Contrast  Result Date: 06/17/2016 CLINICAL DATA:  82 year old male with history of elevated liver function tests, fever and abdominal pain for the past 2 weeks. EXAM: CT ABDOMEN AND PELVIS WITH CONTRAST TECHNIQUE: Multidetector CT imaging of the abdomen and pelvis was performed using the standard protocol following bolus administration of intravenous contrast. CONTRAST:  151m ISOVUE-300 IOPAMIDOL (ISOVUE-300) INJECTION 61% COMPARISON:  CT of the thorax 06/05/2016 (which covered portions of the upper abdomen). MRI of the abdomen with and without IV gadolinium with MRCP on 06/15/2016. FINDINGS: Lower chest: Small right pleural effusion and trace left pleural effusion layering dependently. Areas of dependent subsegmental atelectasis are noted in the lower lobes of the lungs bilaterally. Hepatobiliary: Diffuse heterogeneous per fusion throughout the liver is again noted, and nonspecific. No discrete cystic or solid hepatic lesions. No intrahepatic biliary ductal dilatation. Common bile duct measures up to 9 mm in the porta hepatis, which is within normal limits for the patient's advanced age. Distal common bile duct is displaced anteriorly secondary to mass effect from the patient's large duodenal diverticulae. No gallstones are identified. Gallbladder is unremarkable in appearance. No definite choledocholithiasis. Pancreas: There are inflammatory changes adjacent to the inferior aspect of the pancreatic head and uncinate process, likely reactive to the adjacent inflammation associated with the duodenal diverticuli. Remaining portions of the body and tail of the pancreas are otherwise unremarkable in appearance. No pancreatic mass. No discrete pancreatic or peripancreatic fluid collections. No pancreatic ductal dilatation. Spleen: Unremarkable. Adrenals/Urinary Tract: 2.2 cm low-attenuation lesion in the upper pole of the left kidney is compatible with a simple  cyst. Right kidney and bilateral adrenal glands are normal in appearance. No hydroureteronephrosis. Urinary bladder is normal in appearance. Stomach/Bowel: Normal appearance of the stomach. There are 2 duodenal diverticulae. The first of these appears to arise from the duodenal bulb or the very proximal aspect of the second  portion of the duodenum, best appreciated on image 30 of series 2. This diverticulum is filled with a combination of oral contrast material and some gas. This extends around the medial margin of the distal common bile duct, best appreciated on image 32 of series 2. There is also a very large diverticulum extending off the medial aspect of the second portion of the duodenum more distally, which is best appreciated on axial image 37 of series 2 and coronal image 34 of series 4 where this diverticulum measures up to 4.1 x 5.1 x 4.1 cm. This diverticulum is filled with a combination of fluid and what appears to be feculent material. Notably, there is no oral contrast material within the lumen of this diverticulum, despite abundant contrast material in the distal small bowel and colon. The feculent material in this diverticulum appears to be potentially impacted in the neck of the diverticulum, best appreciated on coronal images 30 and 31 of series 4. This may indicate complete obstruction of the neck of the diverticulum. This diverticulum is surrounded by extensive inflammatory changes throughout the adjacent retroperitoneum, and appears to be the epicenter of the inflammatory process in this patient. This inflammation extends up into the porta hepatis and cephalad adjacent to the inferior vena cava. No pathologic dilatation of more distal aspects of the small bowel or colon. Numerous colonic diverticulae are noted, without surrounding inflammatory changes to suggest an acute colonic diverticulitis at this time. Normal appendix. Vascular/Lymphatic: Aortic atherosclerosis, with fusiform aneurysmal  dilatation of the infrarenal abdominal aorta which measures up to 3.8 x 3.7 cm (image 32 of series 2). No lymphadenopathy noted in the abdomen or pelvis. Reproductive: Prostate gland and seminal vesicles are unremarkable in appearance. Other: Small left inguinal hernia containing only fat. No significant volume of ascites. No pneumoperitoneum. Musculoskeletal: There are no aggressive appearing lytic or blastic lesions noted in the visualized portions of the skeleton. IMPRESSION: 1. Acute duodenal diverticulitis redemonstrated, as above. This appears centered around the larger of the two duodenal diverticulae which arises from the medial aspect of the distal second portion of the duodenum. This larger diverticulum appears potentially obstructed by some internal feculent material which may be impacted in the neck of the diverticulum. There are extensive surrounding inflammatory changes. No oral contrast material is noted outside the confines of the small bowel in this region (although if the diverticulum is obstructed, no oral contrast material would be expected to extravasation). 2. Inflammatory changes are noted adjacent to the inferior aspect of the head and uncinate process of the pancreas, as well as extending up into the porta hepatis and cephalad along the course of the inferior vena cava. Although there may be a mild secondary pancreatitis, this is likely reactive to the duodenal diverticulitis. 3. Small right and trace left pleural effusions with dependent areas of subsegmental atelectasis in the lung bases bilaterally. 4. Aortic atherosclerosis, in addition to right coronary artery disease. In addition, there is an infrarenal abdominal aortic aneurysm which measures up to 3.8 x 3.7 cm. Recommend followup by ultrasound in 2 years. This recommendation follows ACR consensus guidelines: White Paper of the ACR Incidental Findings Committee II on Vascular Findings. J Am Coll Radiol 2013; 10:789-794. 5. Colonic  diverticulosis without evidence to suggest an acute diverticulitis at this time. 6. Small left inguinal hernia containing only fat. 7. Additional incidental findings, as above. These results were discussed by telephone at the time of interpretation on 06/17/2016 at 1:33 pm to Dr. Manus Rudd, who verbally acknowledged  these results. Electronically Signed   By: Vinnie Langton M.D.   On: 06/17/2016 13:53   Dg Chest Port 1 View  Result Date: 06/17/2016 CLINICAL DATA:  Acute onset of shortness of breath and diaphoresis. Fever and tachycardia. Initial encounter. EXAM: PORTABLE CHEST 1 VIEW COMPARISON:  CTA of the chest performed 06/13/2016 FINDINGS: The lungs are well-aerated. Mild vascular congestion is noted. Bibasilar airspace opacities raise concern for pneumonia. There is no evidence of pleural effusion or pneumothorax. The cardiomediastinal silhouette is borderline normal in size. No acute osseous abnormalities are seen. IMPRESSION: Mild vascular congestion. Bibasilar airspace opacities raise concern for pneumonia. Electronically Signed   By: Garald Balding M.D.   On: 06/17/2016 05:18   Micro Results: Recent Results (from the past 240 hour(s))  Culture, blood (Routine X 2) w Reflex to ID Panel     Status: None (Preliminary result)   Collection Time: 06/17/16  4:44 AM  Result Value Ref Range Status   Specimen Description BLOOD BLOOD RIGHT HAND  Final   Special Requests BOTTLES DRAWN AEROBIC AND ANAEROBIC 6CC  Final   Culture PENDING  Incomplete   Report Status PENDING  Incomplete  Culture, blood (Routine X 2) w Reflex to ID Panel     Status: None (Preliminary result)   Collection Time: 06/17/16  4:49 AM  Result Value Ref Range Status   Specimen Description BLOOD BLOOD RIGHT HAND  Final   Special Requests BOTTLES DRAWN AEROBIC AND ANAEROBIC 6CC  Final   Culture PENDING  Incomplete   Report Status PENDING  Incomplete   Studies/Results: Ct Abdomen Pelvis W Contrast  Result Date:  06/17/2016 CLINICAL DATA:  82 year old male with history of elevated liver function tests, fever and abdominal pain for the past 2 weeks. EXAM: CT ABDOMEN AND PELVIS WITH CONTRAST TECHNIQUE: Multidetector CT imaging of the abdomen and pelvis was performed using the standard protocol following bolus administration of intravenous contrast. CONTRAST:  130m ISOVUE-300 IOPAMIDOL (ISOVUE-300) INJECTION 61% COMPARISON:  CT of the thorax 06/05/2016 (which covered portions of the upper abdomen). MRI of the abdomen with and without IV gadolinium with MRCP on 06/15/2016. FINDINGS: Lower chest: Small right pleural effusion and trace left pleural effusion layering dependently. Areas of dependent subsegmental atelectasis are noted in the lower lobes of the lungs bilaterally. Hepatobiliary: Diffuse heterogeneous per fusion throughout the liver is again noted, and nonspecific. No discrete cystic or solid hepatic lesions. No intrahepatic biliary ductal dilatation. Common bile duct measures up to 9 mm in the porta hepatis, which is within normal limits for the patient's advanced age. Distal common bile duct is displaced anteriorly secondary to mass effect from the patient's large duodenal diverticulae. No gallstones are identified. Gallbladder is unremarkable in appearance. No definite choledocholithiasis. Pancreas: There are inflammatory changes adjacent to the inferior aspect of the pancreatic head and uncinate process, likely reactive to the adjacent inflammation associated with the duodenal diverticuli. Remaining portions of the body and tail of the pancreas are otherwise unremarkable in appearance. No pancreatic mass. No discrete pancreatic or peripancreatic fluid collections. No pancreatic ductal dilatation. Spleen: Unremarkable. Adrenals/Urinary Tract: 2.2 cm low-attenuation lesion in the upper pole of the left kidney is compatible with a simple cyst. Right kidney and bilateral adrenal glands are normal in appearance. No  hydroureteronephrosis. Urinary bladder is normal in appearance. Stomach/Bowel: Normal appearance of the stomach. There are 2 duodenal diverticulae. The first of these appears to arise from the duodenal bulb or the very proximal aspect of the second portion of  the duodenum, best appreciated on image 30 of series 2. This diverticulum is filled with a combination of oral contrast material and some gas. This extends around the medial margin of the distal common bile duct, best appreciated on image 32 of series 2. There is also a very large diverticulum extending off the medial aspect of the second portion of the duodenum more distally, which is best appreciated on axial image 37 of series 2 and coronal image 34 of series 4 where this diverticulum measures up to 4.1 x 5.1 x 4.1 cm. This diverticulum is filled with a combination of fluid and what appears to be feculent material. Notably, there is no oral contrast material within the lumen of this diverticulum, despite abundant contrast material in the distal small bowel and colon. The feculent material in this diverticulum appears to be potentially impacted in the neck of the diverticulum, best appreciated on coronal images 30 and 31 of series 4. This may indicate complete obstruction of the neck of the diverticulum. This diverticulum is surrounded by extensive inflammatory changes throughout the adjacent retroperitoneum, and appears to be the epicenter of the inflammatory process in this patient. This inflammation extends up into the porta hepatis and cephalad adjacent to the inferior vena cava. No pathologic dilatation of more distal aspects of the small bowel or colon. Numerous colonic diverticulae are noted, without surrounding inflammatory changes to suggest an acute colonic diverticulitis at this time. Normal appendix. Vascular/Lymphatic: Aortic atherosclerosis, with fusiform aneurysmal dilatation of the infrarenal abdominal aorta which measures up to 3.8 x 3.7 cm  (image 32 of series 2). No lymphadenopathy noted in the abdomen or pelvis. Reproductive: Prostate gland and seminal vesicles are unremarkable in appearance. Other: Small left inguinal hernia containing only fat. No significant volume of ascites. No pneumoperitoneum. Musculoskeletal: There are no aggressive appearing lytic or blastic lesions noted in the visualized portions of the skeleton. IMPRESSION: 1. Acute duodenal diverticulitis redemonstrated, as above. This appears centered around the larger of the two duodenal diverticulae which arises from the medial aspect of the distal second portion of the duodenum. This larger diverticulum appears potentially obstructed by some internal feculent material which may be impacted in the neck of the diverticulum. There are extensive surrounding inflammatory changes. No oral contrast material is noted outside the confines of the small bowel in this region (although if the diverticulum is obstructed, no oral contrast material would be expected to extravasation). 2. Inflammatory changes are noted adjacent to the inferior aspect of the head and uncinate process of the pancreas, as well as extending up into the porta hepatis and cephalad along the course of the inferior vena cava. Although there may be a mild secondary pancreatitis, this is likely reactive to the duodenal diverticulitis. 3. Small right and trace left pleural effusions with dependent areas of subsegmental atelectasis in the lung bases bilaterally. 4. Aortic atherosclerosis, in addition to right coronary artery disease. In addition, there is an infrarenal abdominal aortic aneurysm which measures up to 3.8 x 3.7 cm. Recommend followup by ultrasound in 2 years. This recommendation follows ACR consensus guidelines: White Paper of the ACR Incidental Findings Committee II on Vascular Findings. J Am Coll Radiol 2013; 10:789-794. 5. Colonic diverticulosis without evidence to suggest an acute diverticulitis at this time.  6. Small left inguinal hernia containing only fat. 7. Additional incidental findings, as above. These results were discussed by telephone at the time of interpretation on 06/17/2016 at 1:33 pm to Dr. Manus Rudd, who verbally acknowledged these results.  Electronically Signed   By: Vinnie Langton M.D.   On: 06/17/2016 13:53   Dg Chest Port 1 View  Result Date: 06/17/2016 CLINICAL DATA:  Acute onset of shortness of breath and diaphoresis. Fever and tachycardia. Initial encounter. EXAM: PORTABLE CHEST 1 VIEW COMPARISON:  CTA of the chest performed 06/13/2016 FINDINGS: The lungs are well-aerated. Mild vascular congestion is noted. Bibasilar airspace opacities raise concern for pneumonia. There is no evidence of pleural effusion or pneumothorax. The cardiomediastinal silhouette is borderline normal in size. No acute osseous abnormalities are seen. IMPRESSION: Mild vascular congestion. Bibasilar airspace opacities raise concern for pneumonia. Electronically Signed   By: Garald Balding M.D.   On: 06/17/2016 05:18   Medications:  I have reviewed the patient's current medications Scheduled Meds: . enalapril  10 mg Oral Daily  . insulin aspart  0-5 Units Subcutaneous QHS  . insulin aspart  0-9 Units Subcutaneous TID WC  . metronidazole  250 mg Intravenous Q8H  . pantoprazole (PROTONIX) IV  40 mg Intravenous Q24H  . piperacillin-tazobactam (ZOSYN)  IV  3.375 g Intravenous Q8H  . vancomycin  750 mg Intravenous Q12H   Continuous Infusions: . lactated ringers 50 mL/hr at 06/17/16 1438   PRN Meds:.acetaminophen, morphine injection, ondansetron **OR** ondansetron (ZOFRAN) IV   Assessment/Plan: #1. Duodenal diverticulitis. Discussed with GI. Metronidazole was added to his antibiotic coverage yesterday. Fever resolved at this point. White count 13.2. Bilirubin has improved from 8.2-3.7. AST has improved from 362-254. ALT is minimally changed from 20 weight 218. Dr. Arnoldo Morale will consult. #2. Pancreatitis.  Lipase normalized. #3. Possible pneumonia. No cough or respiratory symptoms at present. Continue antibiotic coverage. Blood cultures pending. #4. Diabetes. Glucose 178. Continue NovoLog as needed.  Dietary modification per GI. Principal Problem:   Pancreatitis Active Problems:   Diverticulosis of colon without hemorrhage   Type 2 diabetes mellitus with complication (HCC)   S/P drug eluting coronary stent placement   Hypertension   Hyperlipidemia     LOS: 5 days   Tamryn Popko 06/18/2016, 9:28 AM

## 2016-06-18 NOTE — Progress Notes (Signed)
Pt requested to walk.  He was assisted x2 up hall to nurses station and around and back to his room.  Tolerated well.Jordan [name]

## 2016-06-18 NOTE — Consult Note (Signed)
Reason for Consult: Elevated liver enzyme tests, leukocytosis Referring Physician: Drs. Willey Blade and Rourk  [name] is an 82 y.o. male.  HPI: Patient is a 82 year old white male who while in the hospital has developed leukocytosis, upper abdominal pain, elevated liver enzyme tests, and fevers. He was initially seen by gastroenterology. CT scan of the abdomen reveals a large duodenal diverticulum which appears to be inflamed and extending into the pancreas and distal common bile duct. I consulted to review the case from a general surgery standpoint. Today, the patient feels much better. He states that his abdominal pain has resolved. He complains of back pain. He is hungry. His passing gas. He is not nauseated. He has not had a fever the last 24 hours.  Past Medical History:  Diagnosis Date  . Arthritis   . BPH (benign prostatic hypertrophy)   . CAD (coronary artery disease) no cardiologist--  sees pcp  dr fagan   s/p  PCI and stenting pRCA 2009  . Diverticulosis of colon   . Fecal urgency   . History of adenomatous polyp of colon    tubular adenoma's 05/ 2016  . Hyperlipidemia   . Hypertension   . Lower urinary tract symptoms (LUTS)   . S/P drug eluting coronary stent placement    12-23-2007  pRCA  . Type 2 diabetes mellitus ([place])   . Wears glasses     Past Surgical History:  Procedure Laterality Date  . CATARACT EXTRACTION W/ INTRAOCULAR LENS  IMPLANT, BILATERAL    . COLONOSCOPY  last one 11/16/2014   polpectomy  . CORONARY ANGIOPLASTY WITH STENT PLACEMENT  12-23-2007  dr hochrein   (abnormal myoview)  PCI w/ DES x1 pRCA (95%),  pCFX 25%,  mCFX 40-50%,  after PDA 30%,  prox, mid, and ostial LAD  30% calcification,  LM diffuse 25% calcification,  ef 60%  . EVALUATION UNDER ANESTHESIA WITH ANAL FISTULECTOMY [name] 06/03/2015   Procedure: ANAL EXAM UNDER ANESTHESIA ,FISTULOTOMY ;  Surgeon: [name], MD;  Location: [facility];  Service: General;  Laterality: [name];   . INGUINAL HERNIA REPAIR Right 2001  . TRANSTHORACIC ECHOCARDIOGRAM  12-06-2007   normal LVF, ef 60%/  mild to moderate  AV calcification without stenosis,  mild AR and TR,  mild LAE    Family History  Problem Relation Age of Onset  . Colon cancer Neg Hx     Social History:  reports that he quit smoking about 33 years ago. His smoking use included Cigarettes. He quit after 30.00 years of use. He has never used smokeless tobacco. He reports that he does not drink alcohol or use drugs.  Allergies: No Known Allergies  Medications:  Scheduled: . enalapril  10 mg Oral Daily  . insulin aspart  0-5 Units Subcutaneous QHS  . insulin aspart  0-9 Units Subcutaneous TID WC  . metronidazole  250 mg Intravenous Q8H  . pantoprazole (PROTONIX) IV  40 mg Intravenous Q24H  . piperacillin-tazobactam (ZOSYN)  IV  3.375 g Intravenous Q8H  . vancomycin  750 mg Intravenous Q12H   Continuous: . lactated ringers 50 mL/hr at 06/17/16 1438    Results for orders placed or performed during the hospital encounter of 06/13/16 (from the past 48 hour(s))  Glucose, capillary     Status: Abnormal   Collection Time: 06/17/16  3:46 AM  Result Value Ref Range   Glucose-Capillary 194 (H) 65 - 99 mg/dL  CBC     Status: Abnormal  Collection Time: 06/17/16  4:44 AM  Result Value Ref Range   WBC 14.4 (H) 4.0 - 10.5 K/uL   RBC 3.92 (L) 4.22 - 5.81 MIL/uL   Hemoglobin 11.6 (L) 13.0 - 17.0 g/dL   HCT 35.2 (L) 39.0 - 52.0 %   MCV 89.8 78.0 - 100.0 fL   MCH 29.6 26.0 - 34.0 pg   MCHC 33.0 30.0 - 36.0 g/dL   RDW 12.9 11.5 - 15.5 %   Platelets 260 150 - 400 K/uL  Comprehensive metabolic panel     Status: Abnormal   Collection Time: 06/17/16  4:44 AM  Result Value Ref Range   Sodium 136 135 - 145 mmol/L   Potassium 3.5 3.5 - 5.1 mmol/L   Chloride 100 (L) 101 - 111 mmol/L   CO2 29 22 - 32 mmol/L   Glucose, Bld 188 (H) 65 - 99 mg/dL   BUN 28 (H) 6 - 20 mg/dL   Creatinine, Ser 0.62 0.61 - 1.24 mg/dL   Calcium  8.3 (L) 8.9 - 10.3 mg/dL   Total Protein 5.9 (L) 6.5 - 8.1 g/dL   Albumin 2.6 (L) 3.5 - 5.0 g/dL   AST 362 (H) 15 - 41 U/L   ALT 208 (H) 17 - 63 U/L   Alkaline Phosphatase 245 (H) 38 - 126 U/L   Total Bilirubin 8.2 (H) 0.3 - 1.2 mg/dL   GFR calc non Af Amer >60 >60 mL/min   GFR calc Af Amer >60 >60 mL/min    Comment: (NOTE) The eGFR has been calculated using the CKD EPI equation. This calculation has not been validated in all clinical situations. eGFR's persistently <60 mL/min signify possible Chronic Kidney Disease.    Anion gap 7 5 - 15  Lipase, blood     Status: None   Collection Time: 06/17/16  4:44 AM  Result Value Ref Range   Lipase 27 11 - 51 U/L  Culture, blood (Routine X 2) w Reflex to ID Panel     Status: None (Preliminary result)   Collection Time: 06/17/16  4:44 AM  Result Value Ref Range   Specimen Description BLOOD BLOOD RIGHT HAND    Special Requests BOTTLES DRAWN AEROBIC AND ANAEROBIC 6CC    Culture PENDING    Report Status PENDING   Culture, blood (Routine X 2) w Reflex to ID Panel     Status: None (Preliminary result)   Collection Time: 06/17/16  4:49 AM  Result Value Ref Range   Specimen Description BLOOD BLOOD RIGHT HAND    Special Requests BOTTLES DRAWN AEROBIC AND ANAEROBIC 6CC    Culture PENDING    Report Status PENDING   Glucose, capillary     Status: Abnormal   Collection Time: 06/17/16  7:57 AM  Result Value Ref Range   Glucose-Capillary 186 (H) 65 - 99 mg/dL  Glucose, capillary     Status: Abnormal   Collection Time: 06/17/16 11:53 AM  Result Value Ref Range   Glucose-Capillary 174 (H) 65 - 99 mg/dL  Glucose, capillary     Status: Abnormal   Collection Time: 06/17/16  5:28 PM  Result Value Ref Range   Glucose-Capillary 132 (H) 65 - 99 mg/dL   Comment 1 Notify RN    Comment 2 Document in Chart   Glucose, capillary     Status: Abnormal   Collection Time: 06/17/16  9:23 PM  Result Value Ref Range   Glucose-Capillary 158 (H) 65 - 99 mg/dL    Comment 1 Notify  RN    Comment 2 Document in Chart   CBC     Status: Abnormal   Collection Time: 06/18/16  6:31 AM  Result Value Ref Range   WBC 13.2 (H) 4.0 - 10.5 K/uL   RBC 3.62 (L) 4.22 - 5.81 MIL/uL   Hemoglobin 10.6 (L) 13.0 - 17.0 g/dL   HCT 32.6 (L) 39.0 - 52.0 %   MCV 90.1 78.0 - 100.0 fL   MCH 29.3 26.0 - 34.0 pg   MCHC 32.5 30.0 - 36.0 g/dL   RDW 13.2 11.5 - 15.5 %   Platelets 265 150 - 400 K/uL  Comprehensive metabolic panel Once     Status: Abnormal   Collection Time: 06/18/16  6:31 AM  Result Value Ref Range   Sodium 134 (L) 135 - 145 mmol/L   Potassium 4.0 3.5 - 5.1 mmol/L   Chloride 100 (L) 101 - 111 mmol/L   CO2 26 22 - 32 mmol/L   Glucose, Bld 178 (H) 65 - 99 mg/dL   BUN 32 (H) 6 - 20 mg/dL   Creatinine, Ser 0.87 0.61 - 1.24 mg/dL   Calcium 7.8 (L) 8.9 - 10.3 mg/dL   Total Protein 5.2 (L) 6.5 - 8.1 g/dL   Albumin 2.2 (L) 3.5 - 5.0 g/dL   AST 254 (H) 15 - 41 U/L   ALT 218 (H) 17 - 63 U/L   Alkaline Phosphatase 216 (H) 38 - 126 U/L   Total Bilirubin 3.7 (H) 0.3 - 1.2 mg/dL    Comment: DELTA CHECK NOTED   GFR calc non Af Amer >60 >60 mL/min   GFR calc Af Amer >60 >60 mL/min    Comment: (NOTE) The eGFR has been calculated using the CKD EPI equation. This calculation has not been validated in all clinical situations. eGFR's persistently <60 mL/min signify possible Chronic Kidney Disease.    Anion gap 8 5 - 15  Glucose, capillary     Status: Abnormal   Collection Time: 06/18/16  7:35 AM  Result Value Ref Range   Glucose-Capillary 171 (H) 65 - 99 mg/dL   Comment 1 Notify RN     Ct Abdomen Pelvis W Contrast  Result Date: 06/17/2016 CLINICAL DATA:  82 year old male with history of elevated liver function tests, fever and abdominal pain for the past 2 weeks. EXAM: CT ABDOMEN AND PELVIS WITH CONTRAST TECHNIQUE: Multidetector CT imaging of the abdomen and pelvis was performed using the standard protocol following bolus administration of intravenous contrast.  CONTRAST:  136m ISOVUE-300 IOPAMIDOL (ISOVUE-300) INJECTION 61% COMPARISON:  CT of the thorax 06/05/2016 (which covered portions of the upper abdomen). MRI of the abdomen with and without IV gadolinium with MRCP on 06/15/2016. FINDINGS: Lower chest: Small right pleural effusion and trace left pleural effusion layering dependently. Areas of dependent subsegmental atelectasis are noted in the lower lobes of the lungs bilaterally. Hepatobiliary: Diffuse heterogeneous per fusion throughout the liver is again noted, and nonspecific. No discrete cystic or solid hepatic lesions. No intrahepatic biliary ductal dilatation. Common bile duct measures up to 9 mm in the porta hepatis, which is within normal limits for the patient's advanced age. Distal common bile duct is displaced anteriorly secondary to mass effect from the patient's large duodenal diverticulae. No gallstones are identified. Gallbladder is unremarkable in appearance. No definite choledocholithiasis. Pancreas: There are inflammatory changes adjacent to the inferior aspect of the pancreatic head and uncinate process, likely reactive to the adjacent inflammation associated with the duodenal diverticuli. Remaining portions of the  body and tail of the pancreas are otherwise unremarkable in appearance. No pancreatic mass. No discrete pancreatic or peripancreatic fluid collections. No pancreatic ductal dilatation. Spleen: Unremarkable. Adrenals/Urinary Tract: 2.2 cm low-attenuation lesion in the upper pole of the left kidney is compatible with a simple cyst. Right kidney and bilateral adrenal glands are normal in appearance. No hydroureteronephrosis. Urinary bladder is normal in appearance. Stomach/Bowel: Normal appearance of the stomach. There are 2 duodenal diverticulae. The first of these appears to arise from the duodenal bulb or the very proximal aspect of the second portion of the duodenum, best appreciated on image 30 of series 2. This diverticulum is filled  with a combination of oral contrast material and some gas. This extends around the medial margin of the distal common bile duct, best appreciated on image 32 of series 2. There is also a very large diverticulum extending off the medial aspect of the second portion of the duodenum more distally, which is best appreciated on axial image 37 of series 2 and coronal image 34 of series 4 where this diverticulum measures up to 4.1 x 5.1 x 4.1 cm. This diverticulum is filled with a combination of fluid and what appears to be feculent material. Notably, there is no oral contrast material within the lumen of this diverticulum, despite abundant contrast material in the distal small bowel and colon. The feculent material in this diverticulum appears to be potentially impacted in the neck of the diverticulum, best appreciated on coronal images 30 and 31 of series 4. This may indicate complete obstruction of the neck of the diverticulum. This diverticulum is surrounded by extensive inflammatory changes throughout the adjacent retroperitoneum, and appears to be the epicenter of the inflammatory process in this patient. This inflammation extends up into the porta hepatis and cephalad adjacent to the inferior vena cava. No pathologic dilatation of more distal aspects of the small bowel or colon. Numerous colonic diverticulae are noted, without surrounding inflammatory changes to suggest an acute colonic diverticulitis at this time. Normal appendix. Vascular/Lymphatic: Aortic atherosclerosis, with fusiform aneurysmal dilatation of the infrarenal abdominal aorta which measures up to 3.8 x 3.7 cm (image 32 of series 2). No lymphadenopathy noted in the abdomen or pelvis. Reproductive: Prostate gland and seminal vesicles are unremarkable in appearance. Other: Small left inguinal hernia containing only fat. No significant volume of ascites. No pneumoperitoneum. Musculoskeletal: There are no aggressive appearing lytic or blastic lesions  noted in the visualized portions of the skeleton. IMPRESSION: 1. Acute duodenal diverticulitis redemonstrated, as above. This appears centered around the larger of the two duodenal diverticulae which arises from the medial aspect of the distal second portion of the duodenum. This larger diverticulum appears potentially obstructed by some internal feculent material which may be impacted in the neck of the diverticulum. There are extensive surrounding inflammatory changes. No oral contrast material is noted outside the confines of the small bowel in this region (although if the diverticulum is obstructed, no oral contrast material would be expected to extravasation). 2. Inflammatory changes are noted adjacent to the inferior aspect of the head and uncinate process of the pancreas, as well as extending up into the porta hepatis and cephalad along the course of the inferior vena cava. Although there may be a mild secondary pancreatitis, this is likely reactive to the duodenal diverticulitis. 3. Small right and trace left pleural effusions with dependent areas of subsegmental atelectasis in the lung bases bilaterally. 4. Aortic atherosclerosis, in addition to right coronary artery disease. In addition, there  is an infrarenal abdominal aortic aneurysm which measures up to 3.8 x 3.7 cm. Recommend followup by ultrasound in 2 years. This recommendation follows ACR consensus guidelines: White Paper of the ACR Incidental Findings Committee II on Vascular Findings. J Am Coll Radiol 2013; 10:789-794. 5. Colonic diverticulosis without evidence to suggest an acute diverticulitis at this time. 6. Small left inguinal hernia containing only fat. 7. Additional incidental findings, as above. These results were discussed by telephone at the time of interpretation on 06/17/2016 at 1:33 pm to Dr. Manus Rudd, who verbally acknowledged these results. Electronically Signed   By: Vinnie Langton M.D.   On: 06/17/2016 13:53   Dg Chest Port  1 View  Result Date: 06/17/2016 CLINICAL DATA:  Acute onset of shortness of breath and diaphoresis. Fever and tachycardia. Initial encounter. EXAM: PORTABLE CHEST 1 VIEW COMPARISON:  CTA of the chest performed 06/13/2016 FINDINGS: The lungs are well-aerated. Mild vascular congestion is noted. Bibasilar airspace opacities raise concern for pneumonia. There is no evidence of pleural effusion or pneumothorax. The cardiomediastinal silhouette is borderline normal in size. No acute osseous abnormalities are seen. IMPRESSION: Mild vascular congestion. Bibasilar airspace opacities raise concern for pneumonia. Electronically Signed   By: Garald Balding M.D.   On: 06/17/2016 05:18    ROS:  Pertinent items noted in HPI and remainder of comprehensive ROS otherwise negative.  Blood pressure (!) 144/58, pulse 73, temperature 98 F (36.7 C), temperature source Oral, resp. rate 20, height '5\' 7"'  (1.702 m), weight 65.3 kg (143 lb 15.4 oz), SpO2 96 %. Physical Exam: Pleasant white male in no acute distress. Head examination reveals normal cephalic, atraumatic No remarkable scleral icterus is noted. Abdomen is soft, nontender, nondistended. I could not palpate the gallbladder. No hepatosplenomegaly, masses, or hernias identified. CT scan reviewed personally. Assessment/Plan: Duodenal diverticulitis with extension into the pancreatic head and extrinsic compression on the distal common bile duct. Patient currently is afebrile with improving liver enzyme tests. Suspect patient had episode of cholangitis, though broadening his antibiotics have helped. Patient does not need a cholecystectomy at this time. Depending on how he progresses, one could offer a cholecystostomy tube for decompressive purposes if doing an EGD/ERCP are prohibitive. This was discussed with Drs. Fagan and Rourk.   Deyonna Fitzsimmons A 06/18/2016, 11:03 AM

## 2016-06-19 LAB — COMPREHENSIVE METABOLIC PANEL
ALK PHOS: 196 U/L — AB (ref 38–126)
ALT: 137 U/L — AB (ref 17–63)
AST: 87 U/L — ABNORMAL HIGH (ref 15–41)
Albumin: 2.1 g/dL — ABNORMAL LOW (ref 3.5–5.0)
Anion gap: 9 (ref 5–15)
BILIRUBIN TOTAL: 2.4 mg/dL — AB (ref 0.3–1.2)
BUN: 29 mg/dL — ABNORMAL HIGH (ref 6–20)
CALCIUM: 7.6 mg/dL — AB (ref 8.9–10.3)
CO2: 26 mmol/L (ref 22–32)
Chloride: 99 mmol/L — ABNORMAL LOW (ref 101–111)
Creatinine, Ser: 0.79 mg/dL (ref 0.61–1.24)
Glucose, Bld: 151 mg/dL — ABNORMAL HIGH (ref 65–99)
Potassium: 3.6 mmol/L (ref 3.5–5.1)
Sodium: 134 mmol/L — ABNORMAL LOW (ref 135–145)
Total Protein: 5.4 g/dL — ABNORMAL LOW (ref 6.5–8.1)

## 2016-06-19 LAB — GLUCOSE, CAPILLARY
GLUCOSE-CAPILLARY: 141 mg/dL — AB (ref 65–99)
GLUCOSE-CAPILLARY: 227 mg/dL — AB (ref 65–99)
Glucose-Capillary: 184 mg/dL — ABNORMAL HIGH (ref 65–99)
Glucose-Capillary: 191 mg/dL — ABNORMAL HIGH (ref 65–99)

## 2016-06-19 LAB — AMYLASE: Amylase: 16 U/L — ABNORMAL LOW (ref 28–100)

## 2016-06-19 LAB — CBC
HCT: 35.2 % — ABNORMAL LOW (ref 39.0–52.0)
Hemoglobin: 11 g/dL — ABNORMAL LOW (ref 13.0–17.0)
MCH: 28.1 pg (ref 26.0–34.0)
MCHC: 31.3 g/dL (ref 30.0–36.0)
MCV: 90 fL (ref 78.0–100.0)
PLATELETS: 239 10*3/uL (ref 150–400)
RBC: 3.91 MIL/uL — AB (ref 4.22–5.81)
RDW: 13.3 % (ref 11.5–15.5)
WBC: 13.6 10*3/uL — AB (ref 4.0–10.5)

## 2016-06-19 LAB — LIPASE, BLOOD: LIPASE: 16 U/L (ref 11–51)

## 2016-06-19 MED ORDER — FLEET ENEMA 7-19 GM/118ML RE ENEM
1.0000 | ENEMA | Freq: Once | RECTAL | Status: AC
  Administered 2016-06-19: 1 via RECTAL

## 2016-06-19 MED ORDER — POTASSIUM CHLORIDE CRYS ER 20 MEQ PO TBCR
20.0000 meq | EXTENDED_RELEASE_TABLET | Freq: Three times a day (TID) | ORAL | Status: DC
Start: 2016-06-19 — End: 2016-06-20
  Administered 2016-06-19 – 2016-06-20 (×4): 20 meq via ORAL
  Filled 2016-06-19 (×5): qty 1

## 2016-06-19 MED ORDER — VANCOMYCIN HCL IN DEXTROSE 750-5 MG/150ML-% IV SOLN
INTRAVENOUS | Status: AC
  Filled 2016-06-19: qty 150

## 2016-06-19 NOTE — Progress Notes (Signed)
Subjective: He denies any pain this morning. He has had no vomiting. He has had no problems eating. He denies any cough.  Objective: Vital signs in last 24 hours: Vitals:   06/18/16 0608 06/18/16 1529 06/18/16 2053 06/19/16 0500  BP: (!) 144/58 (!) 147/56 (!) 156/57 (!) 143/55  Pulse: 73 72 78 71  Resp: '20 20 18 18  '$ Temp: 98 F (36.7 C) 99.3 F (37.4 C) 98.4 F (36.9 C) 98.9 F (37.2 C)  TempSrc: Oral  Oral Oral  SpO2: 96% 93% 96% 93%  Weight:      Height:       Weight change:   Intake/Output Summary (Last 24 hours) at 06/19/16 0727 Last data filed at 06/19/16 0500  Gross per 24 hour  Intake             1310 ml  Output             1400 ml  Net              -90 ml    Physical Exam: Alert. No distress. Lungs clear. Heart regular with a grade 3 systolic murmur. Abdomen is soft and nontender. Extremities reveal no edema.  Lab Results:    Results for orders placed or performed during the hospital encounter of 06/13/16 (from the past 24 hour(s))  Glucose, capillary     Status: Abnormal   Collection Time: 06/18/16  7:35 AM  Result Value Ref Range   Glucose-Capillary 171 (H) 65 - 99 mg/dL   Comment 1 Notify RN   Glucose, capillary     Status: Abnormal   Collection Time: 06/18/16 11:28 AM  Result Value Ref Range   Glucose-Capillary 169 (H) 65 - 99 mg/dL   Comment 1 Notify RN   Glucose, capillary     Status: Abnormal   Collection Time: 06/18/16  5:19 PM  Result Value Ref Range   Glucose-Capillary 210 (H) 65 - 99 mg/dL   Comment 1 Notify RN    Comment 2 Document in Chart   Glucose, capillary     Status: Abnormal   Collection Time: 06/18/16  8:57 PM  Result Value Ref Range   Glucose-Capillary 203 (H) 65 - 99 mg/dL   Comment 1 Notify RN    Comment 2 Document in Chart   Comprehensive metabolic panel     Status: Abnormal   Collection Time: 06/19/16  4:13 AM  Result Value Ref Range   Sodium 134 (L) 135 - 145 mmol/L   Potassium 3.6 3.5 - 5.1 mmol/L   Chloride 99 (L) 101  - 111 mmol/L   CO2 26 22 - 32 mmol/L   Glucose, Bld 151 (H) 65 - 99 mg/dL   BUN 29 (H) 6 - 20 mg/dL   Creatinine, Ser 0.79 0.61 - 1.24 mg/dL   Calcium 7.6 (L) 8.9 - 10.3 mg/dL   Total Protein 5.4 (L) 6.5 - 8.1 g/dL   Albumin 2.1 (L) 3.5 - 5.0 g/dL   AST 87 (H) 15 - 41 U/L   ALT 137 (H) 17 - 63 U/L   Alkaline Phosphatase 196 (H) 38 - 126 U/L   Total Bilirubin 2.4 (H) 0.3 - 1.2 mg/dL   GFR calc non Af Amer >60 >60 mL/min   GFR calc Af Amer >60 >60 mL/min   Anion gap 9 5 - 15  CBC     Status: Abnormal   Collection Time: 06/19/16  4:13 AM  Result Value Ref Range   WBC 13.6 (  H) 4.0 - 10.5 K/uL   RBC 3.91 (L) 4.22 - 5.81 MIL/uL   Hemoglobin 11.0 (L) 13.0 - 17.0 g/dL   HCT 35.2 (L) 39.0 - 52.0 %   MCV 90.0 78.0 - 100.0 fL   MCH 28.1 26.0 - 34.0 pg   MCHC 31.3 30.0 - 36.0 g/dL   RDW 13.3 11.5 - 15.5 %   Platelets 239 150 - 400 K/uL     ABGS No results for input(s): PHART, PO2ART, TCO2, HCO3 in the last 72 hours.  Invalid input(s): PCO2 CULTURES Recent Results (from the past 240 hour(s))  Culture, blood (Routine X 2) w Reflex to ID Panel     Status: None (Preliminary result)   Collection Time: 06/17/16  4:44 AM  Result Value Ref Range Status   Specimen Description BLOOD BLOOD RIGHT HAND  Final   Special Requests BOTTLES DRAWN AEROBIC AND ANAEROBIC 6CC  Final   Culture PENDING  Incomplete   Report Status PENDING  Incomplete  Culture, blood (Routine X 2) w Reflex to ID Panel     Status: None (Preliminary result)   Collection Time: 06/17/16  4:49 AM  Result Value Ref Range Status   Specimen Description BLOOD BLOOD RIGHT HAND  Final   Special Requests BOTTLES DRAWN AEROBIC AND ANAEROBIC 6CC  Final   Culture PENDING  Incomplete   Report Status PENDING  Incomplete   Studies/Results: Ct Abdomen Pelvis W Contrast  Result Date: 06/17/2016 CLINICAL DATA:  82 year old male with history of elevated liver function tests, fever and abdominal pain for the past 2 weeks. EXAM: CT ABDOMEN  AND PELVIS WITH CONTRAST TECHNIQUE: Multidetector CT imaging of the abdomen and pelvis was performed using the standard protocol following bolus administration of intravenous contrast. CONTRAST:  175m ISOVUE-300 IOPAMIDOL (ISOVUE-300) INJECTION 61% COMPARISON:  CT of the thorax 06/05/2016 (which covered portions of the upper abdomen). MRI of the abdomen with and without IV gadolinium with MRCP on 06/15/2016. FINDINGS: Lower chest: Small right pleural effusion and trace left pleural effusion layering dependently. Areas of dependent subsegmental atelectasis are noted in the lower lobes of the lungs bilaterally. Hepatobiliary: Diffuse heterogeneous per fusion throughout the liver is again noted, and nonspecific. No discrete cystic or solid hepatic lesions. No intrahepatic biliary ductal dilatation. Common bile duct measures up to 9 mm in the porta hepatis, which is within normal limits for the patient's advanced age. Distal common bile duct is displaced anteriorly secondary to mass effect from the patient's large duodenal diverticulae. No gallstones are identified. Gallbladder is unremarkable in appearance. No definite choledocholithiasis. Pancreas: There are inflammatory changes adjacent to the inferior aspect of the pancreatic head and uncinate process, likely reactive to the adjacent inflammation associated with the duodenal diverticuli. Remaining portions of the body and tail of the pancreas are otherwise unremarkable in appearance. No pancreatic mass. No discrete pancreatic or peripancreatic fluid collections. No pancreatic ductal dilatation. Spleen: Unremarkable. Adrenals/Urinary Tract: 2.2 cm low-attenuation lesion in the upper pole of the left kidney is compatible with a simple cyst. Right kidney and bilateral adrenal glands are normal in appearance. No hydroureteronephrosis. Urinary bladder is normal in appearance. Stomach/Bowel: Normal appearance of the stomach. There are 2 duodenal diverticulae. The first  of these appears to arise from the duodenal bulb or the very proximal aspect of the second portion of the duodenum, best appreciated on image 30 of series 2. This diverticulum is filled with a combination of oral contrast material and some gas. This extends around the medial margin  of the distal common bile duct, best appreciated on image 32 of series 2. There is also a very large diverticulum extending off the medial aspect of the second portion of the duodenum more distally, which is best appreciated on axial image 37 of series 2 and coronal image 34 of series 4 where this diverticulum measures up to 4.1 x 5.1 x 4.1 cm. This diverticulum is filled with a combination of fluid and what appears to be feculent material. Notably, there is no oral contrast material within the lumen of this diverticulum, despite abundant contrast material in the distal small bowel and colon. The feculent material in this diverticulum appears to be potentially impacted in the neck of the diverticulum, best appreciated on coronal images 30 and 31 of series 4. This may indicate complete obstruction of the neck of the diverticulum. This diverticulum is surrounded by extensive inflammatory changes throughout the adjacent retroperitoneum, and appears to be the epicenter of the inflammatory process in this patient. This inflammation extends up into the porta hepatis and cephalad adjacent to the inferior vena cava. No pathologic dilatation of more distal aspects of the small bowel or colon. Numerous colonic diverticulae are noted, without surrounding inflammatory changes to suggest an acute colonic diverticulitis at this time. Normal appendix. Vascular/Lymphatic: Aortic atherosclerosis, with fusiform aneurysmal dilatation of the infrarenal abdominal aorta which measures up to 3.8 x 3.7 cm (image 32 of series 2). No lymphadenopathy noted in the abdomen or pelvis. Reproductive: Prostate gland and seminal vesicles are unremarkable in appearance.  Other: Small left inguinal hernia containing only fat. No significant volume of ascites. No pneumoperitoneum. Musculoskeletal: There are no aggressive appearing lytic or blastic lesions noted in the visualized portions of the skeleton. IMPRESSION: 1. Acute duodenal diverticulitis redemonstrated, as above. This appears centered around the larger of the two duodenal diverticulae which arises from the medial aspect of the distal second portion of the duodenum. This larger diverticulum appears potentially obstructed by some internal feculent material which may be impacted in the neck of the diverticulum. There are extensive surrounding inflammatory changes. No oral contrast material is noted outside the confines of the small bowel in this region (although if the diverticulum is obstructed, no oral contrast material would be expected to extravasation). 2. Inflammatory changes are noted adjacent to the inferior aspect of the head and uncinate process of the pancreas, as well as extending up into the porta hepatis and cephalad along the course of the inferior vena cava. Although there may be a mild secondary pancreatitis, this is likely reactive to the duodenal diverticulitis. 3. Small right and trace left pleural effusions with dependent areas of subsegmental atelectasis in the lung bases bilaterally. 4. Aortic atherosclerosis, in addition to right coronary artery disease. In addition, there is an infrarenal abdominal aortic aneurysm which measures up to 3.8 x 3.7 cm. Recommend followup by ultrasound in 2 years. This recommendation follows ACR consensus guidelines: White Paper of the ACR Incidental Findings Committee II on Vascular Findings. J Am Coll Radiol 2013; 10:789-794. 5. Colonic diverticulosis without evidence to suggest an acute diverticulitis at this time. 6. Small left inguinal hernia containing only fat. 7. Additional incidental findings, as above. These results were discussed by telephone at the time of  interpretation on 06/17/2016 at 1:33 pm to Dr. Manus Rudd, who verbally acknowledged these results. Electronically Signed   By: Vinnie Langton M.D.   On: 06/17/2016 13:53   Micro Results: Recent Results (from the past 240 hour(s))  Culture, blood (Routine X  2) w Reflex to ID Panel     Status: None (Preliminary result)   Collection Time: 06/17/16  4:44 AM  Result Value Ref Range Status   Specimen Description BLOOD BLOOD RIGHT HAND  Final   Special Requests BOTTLES DRAWN AEROBIC AND ANAEROBIC 6CC  Final   Culture PENDING  Incomplete   Report Status PENDING  Incomplete  Culture, blood (Routine X 2) w Reflex to ID Panel     Status: None (Preliminary result)   Collection Time: 06/17/16  4:49 AM  Result Value Ref Range Status   Specimen Description BLOOD BLOOD RIGHT HAND  Final   Special Requests BOTTLES DRAWN AEROBIC AND ANAEROBIC 6CC  Final   Culture PENDING  Incomplete   Report Status PENDING  Incomplete   Studies/Results: Ct Abdomen Pelvis W Contrast  Result Date: 06/17/2016 CLINICAL DATA:  82 year old male with history of elevated liver function tests, fever and abdominal pain for the past 2 weeks. EXAM: CT ABDOMEN AND PELVIS WITH CONTRAST TECHNIQUE: Multidetector CT imaging of the abdomen and pelvis was performed using the standard protocol following bolus administration of intravenous contrast. CONTRAST:  170m ISOVUE-300 IOPAMIDOL (ISOVUE-300) INJECTION 61% COMPARISON:  CT of the thorax 06/05/2016 (which covered portions of the upper abdomen). MRI of the abdomen with and without IV gadolinium with MRCP on 06/15/2016. FINDINGS: Lower chest: Small right pleural effusion and trace left pleural effusion layering dependently. Areas of dependent subsegmental atelectasis are noted in the lower lobes of the lungs bilaterally. Hepatobiliary: Diffuse heterogeneous per fusion throughout the liver is again noted, and nonspecific. No discrete cystic or solid hepatic lesions. No intrahepatic biliary  ductal dilatation. Common bile duct measures up to 9 mm in the porta hepatis, which is within normal limits for the patient's advanced age. Distal common bile duct is displaced anteriorly secondary to mass effect from the patient's large duodenal diverticulae. No gallstones are identified. Gallbladder is unremarkable in appearance. No definite choledocholithiasis. Pancreas: There are inflammatory changes adjacent to the inferior aspect of the pancreatic head and uncinate process, likely reactive to the adjacent inflammation associated with the duodenal diverticuli. Remaining portions of the body and tail of the pancreas are otherwise unremarkable in appearance. No pancreatic mass. No discrete pancreatic or peripancreatic fluid collections. No pancreatic ductal dilatation. Spleen: Unremarkable. Adrenals/Urinary Tract: 2.2 cm low-attenuation lesion in the upper pole of the left kidney is compatible with a simple cyst. Right kidney and bilateral adrenal glands are normal in appearance. No hydroureteronephrosis. Urinary bladder is normal in appearance. Stomach/Bowel: Normal appearance of the stomach. There are 2 duodenal diverticulae. The first of these appears to arise from the duodenal bulb or the very proximal aspect of the second portion of the duodenum, best appreciated on image 30 of series 2. This diverticulum is filled with a combination of oral contrast material and some gas. This extends around the medial margin of the distal common bile duct, best appreciated on image 32 of series 2. There is also a very large diverticulum extending off the medial aspect of the second portion of the duodenum more distally, which is best appreciated on axial image 37 of series 2 and coronal image 34 of series 4 where this diverticulum measures up to 4.1 x 5.1 x 4.1 cm. This diverticulum is filled with a combination of fluid and what appears to be feculent material. Notably, there is no oral contrast material within the lumen  of this diverticulum, despite abundant contrast material in the distal small bowel and colon. The feculent  material in this diverticulum appears to be potentially impacted in the neck of the diverticulum, best appreciated on coronal images 30 and 31 of series 4. This may indicate complete obstruction of the neck of the diverticulum. This diverticulum is surrounded by extensive inflammatory changes throughout the adjacent retroperitoneum, and appears to be the epicenter of the inflammatory process in this patient. This inflammation extends up into the porta hepatis and cephalad adjacent to the inferior vena cava. No pathologic dilatation of more distal aspects of the small bowel or colon. Numerous colonic diverticulae are noted, without surrounding inflammatory changes to suggest an acute colonic diverticulitis at this time. Normal appendix. Vascular/Lymphatic: Aortic atherosclerosis, with fusiform aneurysmal dilatation of the infrarenal abdominal aorta which measures up to 3.8 x 3.7 cm (image 32 of series 2). No lymphadenopathy noted in the abdomen or pelvis. Reproductive: Prostate gland and seminal vesicles are unremarkable in appearance. Other: Small left inguinal hernia containing only fat. No significant volume of ascites. No pneumoperitoneum. Musculoskeletal: There are no aggressive appearing lytic or blastic lesions noted in the visualized portions of the skeleton. IMPRESSION: 1. Acute duodenal diverticulitis redemonstrated, as above. This appears centered around the larger of the two duodenal diverticulae which arises from the medial aspect of the distal second portion of the duodenum. This larger diverticulum appears potentially obstructed by some internal feculent material which may be impacted in the neck of the diverticulum. There are extensive surrounding inflammatory changes. No oral contrast material is noted outside the confines of the small bowel in this region (although if the diverticulum is  obstructed, no oral contrast material would be expected to extravasation). 2. Inflammatory changes are noted adjacent to the inferior aspect of the head and uncinate process of the pancreas, as well as extending up into the porta hepatis and cephalad along the course of the inferior vena cava. Although there may be a mild secondary pancreatitis, this is likely reactive to the duodenal diverticulitis. 3. Small right and trace left pleural effusions with dependent areas of subsegmental atelectasis in the lung bases bilaterally. 4. Aortic atherosclerosis, in addition to right coronary artery disease. In addition, there is an infrarenal abdominal aortic aneurysm which measures up to 3.8 x 3.7 cm. Recommend followup by ultrasound in 2 years. This recommendation follows ACR consensus guidelines: White Paper of the ACR Incidental Findings Committee II on Vascular Findings. J Am Coll Radiol 2013; 10:789-794. 5. Colonic diverticulosis without evidence to suggest an acute diverticulitis at this time. 6. Small left inguinal hernia containing only fat. 7. Additional incidental findings, as above. These results were discussed by telephone at the time of interpretation on 06/17/2016 at 1:33 pm to Dr. Manus Rudd, who verbally acknowledged these results. Electronically Signed   By: Vinnie Langton M.D.   On: 06/17/2016 13:53   Medications:  I have reviewed the patient's current medications Scheduled Meds: . enalapril  10 mg Oral Daily  . insulin aspart  0-5 Units Subcutaneous QHS  . insulin aspart  0-9 Units Subcutaneous TID WC  . metronidazole  250 mg Intravenous Q8H  . pantoprazole (PROTONIX) IV  40 mg Intravenous Q24H  . piperacillin-tazobactam (ZOSYN)  IV  3.375 g Intravenous Q8H  . potassium chloride  20 mEq Oral TID  . vancomycin  750 mg Intravenous Q12H   Continuous Infusions: . lactated ringers Stopped (06/18/16 1641)   PRN Meds:.acetaminophen, morphine injection, ondansetron **OR** ondansetron (ZOFRAN)  IV   Assessment/Plan: #1. Duodenal diverticulitis. Continue antibiotic therapy. Discussed with surgery yesterday. Bilirubin has improved to  2.4. Transaminases are improving. White count 13.6. #2. Diabetes. Glucose 151. #3. Possible pneumonia. No significant symptoms present. Again, continue antibiotic coverage. Principal Problem:   Pancreatitis Active Problems:   Diverticulosis of colon without hemorrhage   Type 2 diabetes mellitus with complication (HCC)   S/P drug eluting coronary stent placement   Hypertension   Hyperlipidemia     LOS: 6 days   Jordan [name] 06/19/2016, 7:27 AM

## 2016-06-19 NOTE — Progress Notes (Signed)
  Subjective:  Patient feels much better. States she is hungry. He has mild discomfort or soreness in her epigastrium. He has been passing flatus but has not had a BM since admission. He denies chest pain shortness of breath or cough.   Objective: Blood pressure (!) 143/58, pulse 69, temperature 98.9 F (37.2 C), temperature source Oral, resp. rate (!) 24, height '5\' 7"'$  (1.702 m), weight 143 lb 15.4 oz (65.3 kg), SpO2 94 %. Patient is alert and in no acute distress. Conjunctiva is pink. Sclera is mildly icteric Oropharyngeal mucosa is normal. No neck masses or thyromegaly noted. Cardiac exam with regular rhythm normal S1 and S2. No murmur or gallop noted. Auscultation of lungs revealed few crackles and rhonchi at right base. Abdomen symmetrical. Bowel sounds are normal. On palpation abdomen is soft with mild midepigastric tenderness. No organomegaly or masses. No LE edema or clubbing noted.  Labs/studies Results:   Recent Labs  06/17/16 0444 06/18/16 0631 06/19/16 0413  WBC 14.4* 13.2* 13.6*  HGB 11.6* 10.6* 11.0*  HCT 35.2* 32.6* 35.2*  PLT 260 265 239    BMET   Recent Labs  06/17/16 0444 06/18/16 0631 06/19/16 0413  NA 136 134* 134*  K 3.5 4.0 3.6  CL 100* 100* 99*  CO2 '29 26 26  '$ GLUCOSE 188* 178* 151*  BUN 28* 32* 29*  CREATININE 0.62 0.87 0.79  CALCIUM 8.3* 7.8* 7.6*    LFT   Recent Labs  06/17/16 0444 06/18/16 0631 06/19/16 0413  PROT 5.9* 5.2* 5.4*  ALBUMIN 2.6* 2.2* 2.1*  AST 362* 254* 87*  ALT 208* 218* 137*  ALKPHOS 245* 216* 196*  BILITOT 8.2* 3.7* 2.4*    CT images reviewed with Dr. Jobe Igo.  Assessment:  #1. Acute pancreatitis. Initially he was found to have been re-pancreatitis but given his clinical course it is obvious that he has pancreatitis secondary to duodenal diverticulitis. Doubt that he has occult pancreatic duct lesion given that he does not have dilated PD in pancreatic body or tail. He appears to have improved significantly over  the last 24 hours. He is presently on 3 IV antibiotics. #2. Cholestasis secondary to mechanical effects of duodenal diverticulum and distal bile duct not ready clearly delineated on imaging studies. Bilirubin and transaminases are coming down. #3. Chest CT abnormality appears to be due to atelectasis secondary to some diaphragmatic process and the ulcer is small effusion.   Recommendations:  Resume diet. Repeat lab in a.m. If he continues to do well he should be able to go home in the next 24-48 hours. He will need to continue antibiotic for at least 2 weeks. He will be transitioned to by mouth antibiotics at the time of discharge.  Discussed patient's condition with Dr. Jerene Pitch of [facility]. He recommended doing EUS if patient's condition progresses.

## 2016-06-19 NOTE — Progress Notes (Signed)
IV access lost due to leakage during antibiotic infusions.  This happened about 0650.  This nurse tried 2 unsuccessful attempts to restart IV and continue the antibiotics.  The incoming nurse was informed and was aware of what had happened at the latest time in the shift.

## 2016-06-20 LAB — COMPREHENSIVE METABOLIC PANEL
ALT: 91 U/L — ABNORMAL HIGH (ref 17–63)
ANION GAP: 5 (ref 5–15)
AST: 48 U/L — ABNORMAL HIGH (ref 15–41)
Albumin: 2 g/dL — ABNORMAL LOW (ref 3.5–5.0)
Alkaline Phosphatase: 166 U/L — ABNORMAL HIGH (ref 38–126)
BILIRUBIN TOTAL: 1.6 mg/dL — AB (ref 0.3–1.2)
BUN: 26 mg/dL — AB (ref 6–20)
CHLORIDE: 102 mmol/L (ref 101–111)
CO2: 28 mmol/L (ref 22–32)
Calcium: 7.7 mg/dL — ABNORMAL LOW (ref 8.9–10.3)
Creatinine, Ser: 0.75 mg/dL (ref 0.61–1.24)
Glucose, Bld: 162 mg/dL — ABNORMAL HIGH (ref 65–99)
POTASSIUM: 3.8 mmol/L (ref 3.5–5.1)
Sodium: 135 mmol/L (ref 135–145)
TOTAL PROTEIN: 5.2 g/dL — AB (ref 6.5–8.1)

## 2016-06-20 LAB — GLUCOSE, CAPILLARY: GLUCOSE-CAPILLARY: 176 mg/dL — AB (ref 65–99)

## 2016-06-20 MED ORDER — CIPROFLOXACIN HCL 500 MG PO TABS: 500.0000 mg | ORAL_TABLET | Freq: Two times a day (BID) | ORAL | 0 refills | Status: AC

## 2016-06-20 MED ORDER — METRONIDAZOLE 500 MG PO TABS: 500.0000 mg | ORAL_TABLET | Freq: Three times a day (TID) | ORAL | 0 refills | Status: AC

## 2016-06-20 NOTE — Progress Notes (Signed)
Patient with orders to be discharge home. Discharge instructions given, patient verbalized understanding. Patient stable. Patient left in private vehicle with family.  

## 2016-06-20 NOTE — Progress Notes (Signed)
  Subjective:  Patient has no complaints. He has good appetite. He did not experience abdominal pain nausea or vomiting with food yesterday. His bowels finally moved with Fleet enema.     Objective: Blood pressure (!) 145/57, pulse 66, temperature 98.9 F (37.2 C), temperature source Oral, resp. rate 18, height '5\' 7"'$  (1.702 m), weight 143 lb 15.4 oz (65.3 kg), SpO2 95 %. Patient is alert and in no acute distress. Abdomen is soft and nontender without organomegaly or masses.  No LE edema or clubbing noted.  Labs/studies Results:   Recent Labs  06/18/16 0631 06/19/16 0413  WBC 13.2* 13.6*  HGB 10.6* 11.0*  HCT 32.6* 35.2*  PLT 265 239    BMET   Recent Labs  06/18/16 0631 06/19/16 0413 06/20/16 0556  NA 134* 134* 135  K 4.0 3.6 3.8  CL 100* 99* 102  CO2 '26 26 28  '$ GLUCOSE 178* 151* 162*  BUN 32* 29* 26*  CREATININE 0.87 0.79 0.75  CALCIUM 7.8* 7.6* 7.7*    LFT   Recent Labs  06/18/16 0631 06/19/16 0413 06/20/16 0556  PROT 5.2* 5.4* 5.2*  ALBUMIN 2.2* 2.1* 2.0*  AST 254* 87* 48*  ALT 218* 137* 91*  ALKPHOS 216* 196* 166*  BILITOT 3.7* 2.4* 1.6*     Assessment:  #1.Acute pancreatitis felt to be secondary to duodenal diverticulitis. Abdominal pain has resolved. He will need to continue antibiotics for at least one week.  #3. Abnormal LFTs. Continued improvement in bilirubin alkaline phosphatase and transaminases. Abnormal LFTs felt to be due to duodenal diverticulitis impinging upon bile duct. #4. Low serum albumin contrary to acute illness and limited diet. Now that he is eating albumin should come up rapidly.  Recommendations:  Agree with discharge planning. Discussed with Dr. Willey Blade was plan to see patient next week. Further workup unless symptoms relapse.

## 2016-06-20 NOTE — Care Management Note (Signed)
Case Management Note  Patient Details  Name: JOSHAUA EPPLE MRN: [id number] Date of Birth: July 28, 1922    Expected Discharge Date:  06/16/16               Expected Discharge Plan:  Home/Self Care  In-House Referral:  NA  Discharge planning Services  CM Consult  Post Acute Care Choice:  Home Health Choice offered to:  Patient  DME Arranged:    DME Agency:     HH Arranged:  Patient Refused [facility] Agency:     Status of Service:  Completed, signed off  If discussed at [employer] of Stay Meetings, dates discussed:    Additional Comments: Patient discharging today, ind with ADL's, has PCP, transportation, and insurance with drug coverage. Does not want HH at this time. No further CM needs.  Lavren Lewan, [name], RN 06/20/2016, 8:17 AM

## 2016-06-20 NOTE — Care Management Note (Signed)
Case Management Note  Patient Details  Name: Jordan [name] MRN: [id number] Date of Birth: Aug 22, 1922     Expected Discharge Date:  06/16/16               Expected Discharge Plan:  Home/Self Care  In-House Referral:  NA  Discharge planning Services  CM Consult  Post Acute Care Choice:  Home Health Choice offered to:  Patient  DME Arranged:    DME Agency:     HH Arranged:  Patient Refused [name] Agency:     Status of Service:  Completed, signed off  If discussed at [employer] of Stay Meetings, dates discussed:   06/20/2016 Additional Comments:  [name], [name], RN 06/20/2016, 10:40 AM

## 2016-06-20 NOTE — Discharge Summary (Signed)
Physician Discharge Summary  Jordan [name] JSE:[id number] DOB: 1923/04/11 DOA: 06/13/2016   Admit date: 06/13/2016 Discharge date: 06/20/2016  Discharge Diagnoses:  Principal Problem:   Pancreatitis Active Problems:   Diverticulosis of colon without hemorrhage   Type 2 diabetes mellitus with complication (HCC)   S/P drug eluting coronary stent placement   Hypertension   Hyperlipidemia    Wt Readings from Last 3 Encounters:  06/14/16 143 lb 15.4 oz (65.3 kg)  06/03/15 137 lb 5 oz (62.3 kg)  04/30/15 141 lb 6.4 oz (64.1 kg)     Hospital Course:  This patient is a 82 year old male who presented with abdominal pain and vomiting. Lipase was elevated over 600. Liver enzymes were elevated. He underwent a CT scan of the chest abdomen and pelvis and was found to have duodenal diverticulitis. He was kept nothing by mouth. He was treated with IV fluids and IV antibiotics. He had further elevations in his liver enzymes and elevation of his bilirubin. Bilirubin increased to over 8. MRCP revealed no obstruction of his bile duct and no evidence of stones. He was seen in consultation by gastroenterology. He subsequently had a follow-up CT after developing fever. Again there was no evidence of bile duct obstruction. Antibiotic coverage was expanded. He was ultimately treated with Zosyn, vancomycin and metronidazole. Chest x-ray revealed possible developing pneumonia. Blood cultures were negative however.  His abdominal pain resolved and his liver enzymes improved over time. His bilirubin improved to 1.6. Diet was advanced without complications. He had no further fever.  Diabetes was followed with Accu-Cheks and was treated with NovoLog as needed.  He was much improved and stable for discharge on the morning of 12/5. He will be seen in follow-up in my office in one week. Antibiotic coverage will be modified to Cipro and Flagyl.  He was seen in surgical consultation by Dr. Arnoldo Morale but no surgical  intervention was required. He was felt to have had an external compression of his bile duct from the inflammation associated with his duodenal diverticulitis. Evidence of pancreatitis resolved as his amylase and lipase values returned to normal.  Exam on the day of discharge revealed no abdominal tenderness. Lungs are clear. Heart is regular with a grade 3 systolic murmur consistent with his known moderate aortic stenosis.   Discharge Instructions     Medication List    STOP taking these medications   HYDROcodone-acetaminophen 5-325 MG tablet Commonly known as:  NORCO/VICODIN     TAKE these medications   aspirin 81 MG tablet Take 81 mg by mouth every evening.   BENEFIBER PO Take by mouth daily.   ciprofloxacin 500 MG tablet Commonly known as:  CIPRO Take 1 tablet (500 mg total) by mouth 2 (two) times daily.   donepezil 10 MG tablet Commonly known as:  ARICEPT Take 10 mg by mouth at bedtime.   enalapril 10 MG tablet Commonly known as:  VASOTEC Take 10 mg by mouth daily.   hydrochlorothiazide 12.5 MG capsule Commonly known as:  MICROZIDE Take 12.5 mg by mouth daily.   memantine 5 MG tablet Commonly known as:  NAMENDA Take 5 mg by mouth 2 (two) times daily.   metFORMIN 500 MG tablet Commonly known as:  GLUCOPHAGE Take 500 mg by mouth 2 (two) times daily with a meal.   metroNIDAZOLE 500 MG tablet Commonly known as:  FLAGYL Take 1 tablet (500 mg total) by mouth 3 (three) times daily.   polyethylene glycol powder powder Commonly known as:  GLYCOLAX/MIRALAX  Take 17 g by mouth daily. For a bowel movement What changed:  when to take this  additional instructions   pravastatin 80 MG tablet Commonly known as:  PRAVACHOL Take 80 mg by mouth daily.   tamsulosin 0.4 MG Caps capsule Commonly known as:  FLOMAX Take 0.4 mg by mouth daily.        Jordan [name] 06/20/2016

## 2016-06-23 LAB — CULTURE, BLOOD (ROUTINE X 2)
CULTURE: NO GROWTH
Culture: NO GROWTH

## 2016-06-27 DIAGNOSIS — K8501 Idiopathic acute pancreatitis with uninfected necrosis: Secondary | ICD-10-CM | POA: Diagnosis not present

## 2016-06-27 DIAGNOSIS — R945 Abnormal results of liver function studies: Secondary | ICD-10-CM | POA: Diagnosis not present

## 2016-06-27 DIAGNOSIS — Z79899 Other long term (current) drug therapy: Secondary | ICD-10-CM | POA: Diagnosis not present

## 2016-06-28 DIAGNOSIS — K5732 Diverticulitis of large intestine without perforation or abscess without bleeding: Secondary | ICD-10-CM | POA: Diagnosis not present

## 2016-06-28 DIAGNOSIS — K859 Acute pancreatitis without necrosis or infection, unspecified: Secondary | ICD-10-CM | POA: Diagnosis not present

## 2016-07-19 DIAGNOSIS — H43821 Vitreomacular adhesion, right eye: Secondary | ICD-10-CM | POA: Diagnosis not present

## 2016-07-19 DIAGNOSIS — H4322 Crystalline deposits in vitreous body, left eye: Secondary | ICD-10-CM | POA: Diagnosis not present

## 2016-07-19 DIAGNOSIS — H40013 Open angle with borderline findings, low risk, bilateral: Secondary | ICD-10-CM | POA: Diagnosis not present

## 2016-07-19 DIAGNOSIS — H35373 Puckering of macula, bilateral: Secondary | ICD-10-CM | POA: Diagnosis not present

## 2016-07-19 DIAGNOSIS — H04123 Dry eye syndrome of bilateral lacrimal glands: Secondary | ICD-10-CM | POA: Diagnosis not present

## 2016-07-19 DIAGNOSIS — H35342 Macular cyst, hole, or pseudohole, left eye: Secondary | ICD-10-CM | POA: Diagnosis not present

## 2016-07-19 DIAGNOSIS — H26491 Other secondary cataract, right eye: Secondary | ICD-10-CM | POA: Diagnosis not present

## 2016-07-19 DIAGNOSIS — H353190 Nonexudative age-related macular degeneration, unspecified eye, stage unspecified: Secondary | ICD-10-CM | POA: Diagnosis not present

## 2016-07-19 DIAGNOSIS — H353290 Exudative age-related macular degeneration, unspecified eye, stage unspecified: Secondary | ICD-10-CM | POA: Diagnosis not present

## 2016-07-24 DIAGNOSIS — E119 Type 2 diabetes mellitus without complications: Secondary | ICD-10-CM | POA: Diagnosis not present

## 2016-07-24 DIAGNOSIS — E785 Hyperlipidemia, unspecified: Secondary | ICD-10-CM | POA: Diagnosis not present

## 2016-07-24 DIAGNOSIS — Z79899 Other long term (current) drug therapy: Secondary | ICD-10-CM | POA: Diagnosis not present

## 2016-07-24 DIAGNOSIS — I2510 Atherosclerotic heart disease of native coronary artery without angina pectoris: Secondary | ICD-10-CM | POA: Diagnosis not present

## 2016-07-24 DIAGNOSIS — I10 Essential (primary) hypertension: Secondary | ICD-10-CM | POA: Diagnosis not present

## 2016-07-28 DIAGNOSIS — Z6821 Body mass index (BMI) 21.0-21.9, adult: Secondary | ICD-10-CM | POA: Diagnosis not present

## 2016-07-28 DIAGNOSIS — E46 Unspecified protein-calorie malnutrition: Secondary | ICD-10-CM | POA: Diagnosis not present

## 2016-07-28 DIAGNOSIS — K5790 Diverticulosis of intestine, part unspecified, without perforation or abscess without bleeding: Secondary | ICD-10-CM | POA: Diagnosis not present

## 2016-10-09 DIAGNOSIS — E119 Type 2 diabetes mellitus without complications: Secondary | ICD-10-CM | POA: Diagnosis not present

## 2016-10-12 DIAGNOSIS — L570 Actinic keratosis: Secondary | ICD-10-CM | POA: Diagnosis not present

## 2016-10-12 DIAGNOSIS — L218 Other seborrheic dermatitis: Secondary | ICD-10-CM | POA: Diagnosis not present

## 2016-10-12 DIAGNOSIS — X32XXXD Exposure to sunlight, subsequent encounter: Secondary | ICD-10-CM | POA: Diagnosis not present

## 2016-10-12 DIAGNOSIS — D225 Melanocytic nevi of trunk: Secondary | ICD-10-CM | POA: Diagnosis not present

## 2016-10-23 DIAGNOSIS — E119 Type 2 diabetes mellitus without complications: Secondary | ICD-10-CM | POA: Diagnosis not present

## 2016-10-30 DIAGNOSIS — K5792 Diverticulitis of intestine, part unspecified, without perforation or abscess without bleeding: Secondary | ICD-10-CM | POA: Diagnosis not present

## 2016-10-30 DIAGNOSIS — I350 Nonrheumatic aortic (valve) stenosis: Secondary | ICD-10-CM | POA: Diagnosis not present

## 2016-10-30 DIAGNOSIS — E119 Type 2 diabetes mellitus without complications: Secondary | ICD-10-CM | POA: Diagnosis not present

## 2016-11-01 DIAGNOSIS — H35342 Macular cyst, hole, or pseudohole, left eye: Secondary | ICD-10-CM | POA: Diagnosis not present

## 2016-11-01 DIAGNOSIS — H4322 Crystalline deposits in vitreous body, left eye: Secondary | ICD-10-CM | POA: Diagnosis not present

## 2016-11-01 DIAGNOSIS — H43821 Vitreomacular adhesion, right eye: Secondary | ICD-10-CM | POA: Diagnosis not present

## 2016-11-01 DIAGNOSIS — H353112 Nonexudative age-related macular degeneration, right eye, intermediate dry stage: Secondary | ICD-10-CM | POA: Diagnosis not present

## 2016-12-12 ENCOUNTER — Ambulatory Visit (HOSPITAL_COMMUNITY): Payer: Medicare Other | Attending: Orthopedic Surgery | Admitting: Physical Therapy

## 2016-12-12 DIAGNOSIS — M5136 Other intervertebral disc degeneration, lumbar region: Secondary | ICD-10-CM | POA: Diagnosis not present

## 2016-12-12 DIAGNOSIS — M5416 Radiculopathy, lumbar region: Secondary | ICD-10-CM | POA: Diagnosis not present

## 2016-12-12 DIAGNOSIS — M5441 Lumbago with sciatica, right side: Secondary | ICD-10-CM | POA: Diagnosis not present

## 2016-12-12 DIAGNOSIS — R293 Abnormal posture: Secondary | ICD-10-CM | POA: Diagnosis present

## 2016-12-12 NOTE — Patient Instructions (Addendum)
Isometric Abdominal    Lying on back with knees bent, tighten stomach by pulling your two sides together as if you were being punched in the stomach.. Hold ___3_ seconds. Repeat _10___ times per set. Do ___1_ sets per session. Do ____4 sessions per day.  http://orth.exer.us/1086   Copyright  VHI. All rights reserved.  Backward Bend (Standing)    Arch backward to make hollow of back deeper. Hold __3__ seconds. Repeat __10__ times per set. Do __1__ sets per session. Do __4__ sessions per day.  http://orth.exer.us/178   Copyright  VHI. All rights reserved.  Hamstring Stretch: Active    Support behind right knee. Starting with knee bent, attempt to straighten knee until a comfortable stretch is felt in back of thigh. Hold __30__ seconds. Repeat __3__ times per set. Do __1__ sets per session. Do ___2_ sessions per day.  http://orth.exer.us/158   Copyright  VHI. All rights reserved.

## 2016-12-12 NOTE — Therapy (Signed)
[facility], [place], [address] Phone: [phone]   Fax:  [phone]  Physical Therapy Evaluation  Patient Details  Name: Jordan [name] MRN: [id number] Date of Birth: 08/20/1922 Referring Provider: Victorino December  Encounter Date: 12/12/2016      PT End of Session - 12/12/16 1543    Visit Number 1   Number of Visits 8   Date for PT Re-Evaluation 01/11/17   Authorization Type BCBS medicare   Authorization - Visit Number 1   Authorization - Number of Visits 8   PT Start Time 1500   PT Stop Time 6314   PT Time Calculation (min) 44 min   Activity Tolerance Patient tolerated treatment well   Behavior During Therapy [facility] for tasks assessed/performed      Past Medical History:  Diagnosis Date  . Arthritis   . BPH (benign prostatic hypertrophy)   . CAD (coronary artery disease) no cardiologist--  sees pcp  dr fagan   s/p  PCI and stenting pRCA 2009  . Diverticulosis of colon   . Fecal urgency   . History of adenomatous polyp of colon    tubular adenoma's 05/ 2016  . Hyperlipidemia   . Hypertension   . Lower urinary tract symptoms (LUTS)   . S/P drug eluting coronary stent placement    12-23-2007  pRCA  . Type 2 diabetes mellitus ([place])   . Wears glasses     Past Surgical History:  Procedure Laterality Date  . CATARACT EXTRACTION W/ INTRAOCULAR LENS  IMPLANT, BILATERAL    . COLONOSCOPY  last one 11/16/2014   polpectomy  . CORONARY ANGIOPLASTY WITH STENT PLACEMENT  12-23-2007  dr hochrein   (abnormal myoview)  PCI w/ DES x1 pRCA (95%),  pCFX 25%,  mCFX 40-50%,  after PDA 30%,  prox, mid, and ostial LAD  30% calcification,  LM diffuse 25% calcification,  ef 60%  . EVALUATION UNDER ANESTHESIA WITH ANAL FISTULECTOMY [name] 06/03/2015   Procedure: ANAL EXAM UNDER ANESTHESIA ,FISTULOTOMY ;  Surgeon: [name], MD;  Location: [place];  Service: General;  Laterality: [name];  . INGUINAL HERNIA  REPAIR Right 2001  . TRANSTHORACIC ECHOCARDIOGRAM  12-06-2007   normal LVF, ef 60%/  mild to moderate  AV calcification without stenosis,  mild AR and TR,  mild LAE    There were no vitals filed for this visit.       Subjective Assessment - 12/12/16 1503    Subjective Mr. Batch states that he has been experiencing pain in his lower back that occasionally, (1-2 x a week), goes down his Rt leg to above the knee.  He goes to the gym 2-3 times a week which does not increase nor decrease his pain.s pain.  He touches his toes 30 times a day and does 25 sit ups and this does not increase nor decrease his pain.  He went to the MD who has referred him to physical therapy.    Pertinent History OA,, CAD, DM   Limitations Walking;House hold activities;Standing   How long can you sit comfortably? sitting doesn't change pain    How long can you stand comfortably? does not change pain    How long can you walk comfortably? does not change pain    Currently in Pain? Yes  when he has leg pain the pain goes above a 10/10  it is paralyzing.    Pain Score 3    Pain Location  Back   Pain Orientation Lower   Pain Descriptors / Indicators Aching   Pain Type Chronic pain   Pain Radiating Towards right leg    Pain Onset More than a month ago   Pain Frequency Constant  dull ache is constant; radicular pain occurs occasionally 1-2 x a week but when it occurs it is paralyzing.    Aggravating Factors  unsure   Pain Relieving Factors unsure    Effect of Pain on Daily Activities pt just keeps going    Multiple Pain Sites No            [facility] PT Assessment - 12/12/16 0001      Assessment   Medical Diagnosis Rt sciatica    Referring Provider Victorino December   Onset Date/Surgical Date 09/14/16   Next MD Visit 01/23/2017   Prior Therapy not for this instance      Precautions   Precautions Fall     Restrictions   Weight Bearing Restrictions No     Balance Screen   Has the patient fallen in the past 6  months No   Has the patient had a decrease in activity level because of a fear of falling?  Yes   Is the patient reluctant to leave their home because of a fear of falling?  No     Home Ecologist residence     Prior Function   Level of Independence Independent with household mobility with device   Vocation Retired   Leisure works out at [employer]   Overall Cognitive Status Within [employer] for tasks assessed     Observation/Other Assessments   Focus on Therapeutic Outcomes (FOTO)  CK     Functional Tests   Functional tests Single leg stance;Sit to Stand     Single Leg Stance   Comments Rt: 6 seconds: Lt 6 seconds      Sit to Stand   Comments 5x 14.28     Posture/Postural Control   Posture/Postural Control Postural limitations   Postural Limitations Rounded Shoulders;Forward head;Decreased lumbar lordosis     ROM / Strength   AROM / PROM / Strength --  PT LE strength is functional      Flexibility   Soft Tissue Assessment /Muscle Length yes   Hamstrings LT 130; RT 145     Palpation   Palpation comment tight lumbar paraspinal mm             Objective measurements completed on examination: See above findings.          [facility] Adult PT Treatment/Exercise - 12/12/16 0001      Exercises   Exercises Lumbar     Lumbar Exercises: Stretches   Active Hamstring Stretch 3 reps;30 seconds   Active Hamstring Stretch Limitations supine     Lumbar Exercises: Supine   Ab Set 5 reps   Other Supine Lumbar Exercises decompression exercises 1-5                 PT Education - 12/12/16 1542    Education provided Yes   Education Details HEP   Person(s) Educated Patient   Methods Explanation;Handout;Demonstration   Comprehension Verbalized understanding;Returned demonstration          PT Short Term Goals - 12/12/16 1553      PT SHORT TERM GOAL #1   Title Pt back pain level ongoing basis to be no  greater than a 1/10 with  his Rt radicular  shooting pain no greater than a 8/10 to allow pt to vacuum in comfort.    Time 2   Period Weeks   Status New     PT SHORT TERM GOAL #2   Title Pt to be able to demonstrate good body mechanics for bed mobility and lifting to decrease stress on lumbar spine    Time 2   Period Weeks   Status New     PT SHORT TERM GOAL #3   Title Pt to be able to single leg stance for 10 seconds to decrease risk of falling.    Time 2   Period Weeks   Status New           PT Long Term Goals - 12/12/16 1556      PT LONG TERM GOAL #1   Title Pt to have no ongoing back pain; Pt radicular pain to be occuring only once every two weeks with pain level no greater than a 5/10 to allow pt to be able to have no difficulty walking for 30 mintues.   Time 4   Period Weeks   Status New     PT LONG TERM GOAL #2   Title Pt to be able to demonstrate good lifting techinques from the trunk of a car or 12" from the floor to allow pt to be able to lift groceries out of his trunk without difficulty.    Time 4   Period Weeks   Status New     PT LONG TERM GOAL #3   Title Pt to understand that bending forward places increased stress on his low back and he should therefore not be touching his toes 30 times a day; nor should he be completing sit ups.  Pt to be educated in crunches and supine hamstring stretching instead.    Time 4   Period Weeks   Status New                Plan - 12/12/16 1545    Clinical Impression Statement Mr. Lant is a 83 yo male who is still actively going to the gym.  He has constant back pain and periodic radicular symptoms which he descibes as incompasitating.  The radicular symptoms only occure 1-2 times a week and are very quick in nature lasting from seconds up to a minute.  He has been referred to skilled physical therapy by his physician.  At this time he does not believe that his exercise program is aggravating his condition.   Evaluation demonstrateds postural changes, poor body mechnaics, tight lumbar paraspinal mm, decreased flexibility and decreased balance.  Mr. Swor will benefit from skilled physical therapy to address these issues and decrease his risk of falling as well as his pain.    Clinical Presentation Stable   Clinical Decision Making Moderate   Rehab Potential Good   PT Frequency 2x / week   PT Duration 4 weeks   PT Treatment/Interventions ADLs/Self Care Home Management;Traction;Manual techniques;Therapeutic exercise;Patient/family education   PT Next Visit Plan Review good body mechanics for sit to supine, supine to sit and sit to stand.  Begin T band decompression exercises.  Complete manual to decrease tension in paraspinal mm. Progress to wall archs, POE, wall pushups.     PT Home Exercise Plan abdominal set, standing lumbar extension, hamstring stretch and decompression exercises 1-5.   Consulted and Agree with Plan of Care Patient      Patient will benefit from skilled  therapeutic intervention in order to improve the following deficits and impairments:  Decreased activity tolerance, Decreased balance, Impaired flexibility, Pain, Postural dysfunction  Visit Diagnosis: Radiculopathy, lumbar region  Abnormal posture      G-Codes - 12-23-2016 1604    Functional Assessment Tool Used (Outpatient Only) foto   Functional Limitation Other PT primary   Other PT Primary Current Status (P4982) At least 40 percent but less than 60 percent impaired, limited or restricted   Other PT Primary Goal Status (M4158) At least 20 percent but less than 40 percent impaired, limited or restricted       Problem List Patient Active Problem List   Diagnosis Date Noted  . Pancreatitis 06/13/2016  . Type 2 diabetes mellitus with complication ([place])   . S/P drug eluting coronary stent placement   . Hypertension   . Hyperlipidemia   . History of colonic polyps   . Diverticulosis of colon without hemorrhage   .  Hemorrhoids 10/23/2014  . Constipation 06/03/2012  . Balance disorder 05/29/2012    Rayetta Humphrey, PT CLT [phone] 12-23-16, 4:06 PM  [place] [place] [address] [place], [place], [address] Phone: [phone]   Fax:  [phone]  Name: Jordan [name] MRN: [id number] Date of Birth: Nov 23, 1922

## 2016-12-18 ENCOUNTER — Ambulatory Visit (HOSPITAL_COMMUNITY): Payer: Medicare Other | Attending: Orthopedic Surgery | Admitting: Physical Therapy

## 2016-12-18 DIAGNOSIS — R293 Abnormal posture: Secondary | ICD-10-CM | POA: Diagnosis present

## 2016-12-18 DIAGNOSIS — M5416 Radiculopathy, lumbar region: Secondary | ICD-10-CM | POA: Diagnosis present

## 2016-12-18 NOTE — Patient Instructions (Addendum)
Lifting Principles  .Maintain proper posture and head alignment. .Slide object as close as possible before lifting. .Move obstacles out of the way. .Test before lifting; ask for help if too heavy. .Tighten stomach muscles without holding breath. .Use smooth movements; do not jerk. .Use legs to do the work, and pivot with feet. .Distribute the work load symmetrically and close to the center of trunk. .Push instead of pull whenever possible.  Copyright  VHI. All rights reserved.  Deep Squat    Squat and lift with both arms held against upper trunk. Tighten stomach muscles without holding breath. Use smooth movements to avoid jerking.  Copyright  VHI. All rights reserved.  Getting Into / Out of Bed    Lower self to lie down on one side by raising legs and lowering head at the same time. Use arms to assist moving without twisting. Bend both knees to roll onto back if desired. To sit up, start from lying on side, and use same move-ments in reverse. Keep trunk aligned with legs.   Copyright  VHI. All rights reserved.

## 2016-12-18 NOTE — Therapy (Signed)
[place], [place], [address] Phone: [phone]   Fax:  [phone]  Physical Therapy Treatment  Patient Details  Name: Jordan [name] MRN: [id number] Date of Birth: [birth year] Referring Provider: Victorino December  Encounter Date: 12/18/2016      PT End of Session - 12/18/16 1342    Visit Number 2   Number of Visits 8   Date for PT Re-Evaluation 01/11/17   Authorization Type BCBS medicare   Authorization - Visit Number 2   Authorization - Number of Visits 8   PT Start Time 1300   PT Stop Time 1345   PT Time Calculation (min) 45 min   Activity Tolerance Patient tolerated treatment well   Behavior During Therapy [facility] for tasks assessed/performed      Past Medical History:  Diagnosis Date  . Arthritis   . BPH (benign prostatic hypertrophy)   . CAD (coronary artery disease) no cardiologist--  sees pcp  dr fagan   s/p  PCI and stenting pRCA 2009  . Diverticulosis of colon   . Fecal urgency   . History of adenomatous polyp of colon    tubular adenoma's 05/ 2016  . Hyperlipidemia   . Hypertension   . Lower urinary tract symptoms (LUTS)   . S/P drug eluting coronary stent placement    12-23-2007  pRCA  . Type 2 diabetes mellitus ([place])   . Wears glasses     Past Surgical History:  Procedure Laterality Date  . CATARACT EXTRACTION W/ INTRAOCULAR LENS  IMPLANT, BILATERAL    . COLONOSCOPY  last one 11/16/2014   polpectomy  . CORONARY ANGIOPLASTY WITH STENT PLACEMENT  12-23-2007  dr hochrein   (abnormal myoview)  PCI w/ DES x1 pRCA (95%),  pCFX 25%,  mCFX 40-50%,  after PDA 30%,  prox, mid, and ostial LAD  30% calcification,  LM diffuse 25% calcification,  ef 60%  . EVALUATION UNDER ANESTHESIA WITH ANAL FISTULECTOMY [name] 06/03/2015   Procedure: ANAL EXAM UNDER ANESTHESIA ,FISTULOTOMY ;  Surgeon: [name], MD;  Location: [place] [facility];  Service: General;  Laterality: [name];  . INGUINAL HERNIA  REPAIR Right 2001  . TRANSTHORACIC ECHOCARDIOGRAM  12-06-2007   normal LVF, ef 60%/  mild to moderate  AV calcification without stenosis,  mild AR and TR,  mild LAE    There were no vitals filed for this visit.      Subjective Assessment - 12/18/16 1307    Subjective Pt states that he completed the exercises once a day    Pertinent History OA,, CAD, DM   Limitations Walking;House hold activities;Standing   How long can you sit comfortably? sitting doesn't change pain    How long can you stand comfortably? does not change pain    How long can you walk comfortably? does not change pain    Pain Score 2    Pain Location Back   Pain Onset More than a month ago                         [facility] Adult PT Treatment/Exercise - 12/18/16 0001      Lumbar Exercises: Stretches   Active Hamstring Stretch 3 reps;30 seconds   Active Hamstring Stretch Limitations supine   Single Knee to Chest Stretch 3 reps;30 seconds     Lumbar Exercises: Supine   Ab Set 5 reps   Bridge 10 reps   Other Supine Lumbar  Exercises decompression exercises 1-5    Other Supine Lumbar Exercises t-band decompression exercises 1-4 x 5 each      Manual Therapy   Manual Therapy Soft tissue mobilization   Manual therapy comments done seperate from all other aspects of treatment   Soft tissue mobilization massage to paraspinal mm to decrease tightness to inclde efflurage and petrissage                 PT Education - 12/18/16 1342    Education provided Yes   Education Details To stop touching his toes and stop sit ups as they are bad on the back    Person(s) Educated Patient   Methods Explanation   Comprehension Verbalized understanding          PT Short Term Goals - 12/18/16 1344      PT SHORT TERM GOAL #1   Title Pt back pain level ongoing basis to be no greater than a 1/10 with his Rt radicular  shooting pain no greater than a 8/10 to allow pt to vacuum in comfort.    Time 2   Period  Weeks   Status On-going     PT SHORT TERM GOAL #2   Title Pt to be able to demonstrate good body mechanics for bed mobility and lifting to decrease stress on lumbar spine    Time 2   Period Weeks   Status On-going     PT SHORT TERM GOAL #3   Title Pt to be able to single leg stance for 10 seconds to decrease risk of falling.    Time 2   Period Weeks   Status On-going           PT Long Term Goals - 12/18/16 1344      PT LONG TERM GOAL #1   Title Pt to have no ongoing back pain; Pt radicular pain to be occuring only once every two weeks with pain level no greater than a 5/10 to allow pt to be able to have no difficulty walking for 30 mintues.   Time 4   Period Weeks   Status On-going     PT LONG TERM GOAL #2   Title Pt to be able to demonstrate good lifting techinques from the trunk of a car or 12" from the floor to allow pt to be able to lift groceries out of his trunk without difficulty.    Time 4   Period Weeks   Status On-going     PT LONG TERM GOAL #3   Title Pt to understand that bending forward places increased stress on his low back and he should therefore not be touching his toes 30 times a day; nor should he be completing sit ups.  Pt to be educated in crunches and supine hamstring stretching instead.    Time 4   Period Weeks   Status On-going               Plan - 12/18/16 1343    Clinical Impression Statement Evaluation and goals reviewed with pt.  Added t-band decompression exercises, body mechanics and bridge.    Rehab Potential Good   PT Frequency 2x / week   PT Duration 4 weeks   PT Treatment/Interventions ADLs/Self Care Home Management;Traction;Manual techniques;Therapeutic exercise;Patient/family education   PT Next Visit Plan Continue manual begin prone exercises for back strengthening    PT Home Exercise Plan abdominal set, standing lumbar extension, hamstring stretch and decompression exercises 1-5. Tband decompression  exercises and body  mechanic sheet.    Consulted and Agree with Plan of Care Patient      Patient will benefit from skilled therapeutic intervention in order to improve the following deficits and impairments:  Decreased activity tolerance, Decreased balance, Impaired flexibility, Pain, Postural dysfunction  Visit Diagnosis: Radiculopathy, lumbar region     Problem List Patient Active Problem List   Diagnosis Date Noted  . Pancreatitis 06/13/2016  . Type 2 diabetes mellitus with complication ([place])   . S/P drug eluting coronary stent placement   . Hypertension   . Hyperlipidemia   . History of colonic polyps   . Diverticulosis of colon without hemorrhage   . Hemorrhoids 10/23/2014  . Constipation 06/03/2012  . Balance disorder 05/29/2012    Rayetta Humphrey, PT CLT [phone] 12/18/2016, 1:46 PM  [place] [address] [place], [place], [address] Phone: [phone]   Fax:  [phone]  Name: Jordan [name] MRN: [id number] Date of Birth: May 17, 1923

## 2016-12-20 ENCOUNTER — Ambulatory Visit (HOSPITAL_COMMUNITY): Payer: Medicare Other | Admitting: Physical Therapy

## 2016-12-20 DIAGNOSIS — M5416 Radiculopathy, lumbar region: Secondary | ICD-10-CM

## 2016-12-20 DIAGNOSIS — R293 Abnormal posture: Secondary | ICD-10-CM

## 2016-12-20 NOTE — Patient Instructions (Signed)
Piriformis Stretch, Sitting    Sit, one ankle on opposite knee, same-side hand on crossed knee. Push down on knee, keeping spine straight. Lean torso forward, with flat back, until tension is felt in hamstrings and gluteals of crossed-leg side. Hold _30__ seconds. Repeat _3__ times per session. Do _2__ sessions per day.  Copyright  VHI. All rights reserved.

## 2016-12-20 NOTE — Therapy (Signed)
[place], [place], [address] Phone: [phone]   Fax:  [phone]  Physical Therapy Treatment  Patient Details  Name: Jordan [name] MRN: [id number] Date of Birth: August 05, 1922 Referring Provider: Victorino December  Encounter Date: 12/20/2016      PT End of Session - 12/20/16 1355    Visit Number 3   Number of Visits 8   Date for PT Re-Evaluation 01/11/17   Authorization Type [employer] medicare   Authorization - Visit Number 3   Authorization - Number of Visits 8   PT Start Time 7017   PT Stop Time 1358   PT Time Calculation (min) 50 min   Activity Tolerance Patient tolerated treatment well   Behavior During Therapy [facility] for tasks assessed/performed      Past Medical History:  Diagnosis Date  . Arthritis   . BPH (benign prostatic hypertrophy)   . CAD (coronary artery disease) no cardiologist--  sees pcp  dr fagan   s/p  PCI and stenting pRCA 2009  . Diverticulosis of colon   . Fecal urgency   . History of adenomatous polyp of colon    tubular adenoma's 05/ 2016  . Hyperlipidemia   . Hypertension   . Lower urinary tract symptoms (LUTS)   . S/P drug eluting coronary stent placement    12-23-2007  pRCA  . Type 2 diabetes mellitus ([place])   . Wears glasses     Past Surgical History:  Procedure Laterality Date  . CATARACT EXTRACTION W/ INTRAOCULAR LENS  IMPLANT, BILATERAL    . COLONOSCOPY  last one 11/16/2014   polpectomy  . CORONARY ANGIOPLASTY WITH STENT PLACEMENT  12-23-2007  dr hochrein   (abnormal myoview)  PCI w/ DES x1 pRCA (95%),  pCFX 25%,  mCFX 40-50%,  after PDA 30%,  prox, mid, and ostial LAD  30% calcification,  LM diffuse 25% calcification,  ef 60%  . EVALUATION UNDER ANESTHESIA WITH ANAL FISTULECTOMY [name] 06/03/2015   Procedure: ANAL EXAM UNDER ANESTHESIA ,FISTULOTOMY ;  Surgeon: [name], MD;  Location: [place];  Service: General;  Laterality: [name];  . INGUINAL HERNIA  REPAIR Right 2001  . TRANSTHORACIC ECHOCARDIOGRAM  12-06-2007   normal LVF, ef 60%/  mild to moderate  AV calcification without stenosis,  mild AR and TR,  mild LAE    There were no vitals filed for this visit.      Subjective Assessment - 12/20/16 1309    Subjective Pt reports he is getting better. currently without pain.   Currently in Pain? No/denies   Pain Orientation Lower   Pain Descriptors / Indicators Aching   Pain Type Chronic pain                         [facility] Adult PT Treatment/Exercise - 12/20/16 0001      Lumbar Exercises: Stretches   Active Hamstring Stretch 3 reps;30 seconds   Active Hamstring Stretch Limitations supine   Single Knee to Chest Stretch 3 reps;30 seconds   Piriformis Stretch 3 reps;30 seconds   Piriformis Stretch Limitations bilaterally in seated      Lumbar Exercises: Supine   Ab Set 10 reps   Bridge 10 reps   Other Supine Lumbar Exercises decompression exercises 1-5    Other Supine Lumbar Exercises t-band decompression exercises 1-4 x 5 each      Lumbar Exercises: Prone   Other Prone Lumbar Exercises (P)  heelsqueeze 10 reps     Manual Therapy   Manual Therapy Soft tissue mobilization   Manual therapy comments done seperate from all other aspects of treatment   Soft tissue mobilization (P)  massage to paraspinal mm to decrease tightness to inclde efflurage and petrissage                   PT Short Term Goals - 12/18/16 1344      PT SHORT TERM GOAL #1   Title Pt back pain level ongoing basis to be no greater than a 1/10 with his Rt radicular  shooting pain no greater than a 8/10 to allow pt to vacuum in comfort.    Time 2   Period Weeks   Status On-going     PT SHORT TERM GOAL #2   Title Pt to be able to demonstrate good body mechanics for bed mobility and lifting to decrease stress on lumbar spine    Time 2   Period Weeks   Status On-going     PT SHORT TERM GOAL #3   Title Pt to be able to single leg  stance for 10 seconds to decrease risk of falling.    Time 2   Period Weeks   Status On-going           PT Long Term Goals - 12/18/16 1344      PT LONG TERM GOAL #1   Title Pt to have no ongoing back pain; Pt radicular pain to be occuring only once every two weeks with pain level no greater than a 5/10 to allow pt to be able to have no difficulty walking for 30 mintues.   Time 4   Period Weeks   Status On-going     PT LONG TERM GOAL #2   Title Pt to be able to demonstrate good lifting techinques from the trunk of a car or 12" from the floor to allow pt to be able to lift groceries out of his trunk without difficulty.    Time 4   Period Weeks   Status On-going     PT LONG TERM GOAL #3   Title Pt to understand that bending forward places increased stress on his low back and he should therefore not be touching his toes 30 times a day; nor should he be completing sit ups.  Pt to be educated in crunches and supine hamstring stretching instead.    Time 4   Period Weeks   Status On-going               Plan - 12/20/16 1420    Clinical Impression Statement Continued with focus on core stabilization.  Continues to require cues to complete decompression exercises correctly with poor recall.  Pt with c/o shooting pain down LE's  with standing at times and feeling of giving way.  Most pain and tightness in glutes and hips (Rt more than Lt) and able to decrease spasm/tightness with manual techniques to this area.  Added heelsqueezes to activate glute musculature and seated piriformis stretch to decrease tightness.  Pt given copy of pirifomis instructions for HEP.  Pt reported feeling looser at end of session.    Rehab Potential Good   PT Frequency 2x / week   PT Duration 4 weeks   PT Treatment/Interventions ADLs/Self Care Home Management;Traction;Manual techniques;Therapeutic exercise;Patient/family education   PT Next Visit Plan Progress prone exercises for back strengthening as  tolerated.  Continue manual as needed.  PT Home Exercise Plan abdominal set, standing lumbar extension, hamstring stretch and decompression exercises 1-5.  12/20/16:  seated piriformis stretch   Consulted and Agree with Plan of Care Patient      Patient will benefit from skilled therapeutic intervention in order to improve the following deficits and impairments:  Decreased activity tolerance, Decreased balance, Impaired flexibility, Pain, Postural dysfunction  Visit Diagnosis: Radiculopathy, lumbar region  Abnormal posture     Problem List Patient Active Problem List   Diagnosis Date Noted  . Pancreatitis 06/13/2016  . Type 2 diabetes mellitus with complication ([place])   . S/P drug eluting coronary stent placement   . Hypertension   . Hyperlipidemia   . History of colonic polyps   . Diverticulosis of colon without hemorrhage   . Hemorrhoids 10/23/2014  . Constipation 06/03/2012  . Balance disorder 05/29/2012    Teena Irani, PTA/CLT [phone]  12/20/2016, 2:25 PM  [facility] [address] [place], [place], [address] Phone: [phone]   Fax:  [phone]  Name: Jordan [name] MRN: [id number] Date of Birth: [birth year]

## 2016-12-25 ENCOUNTER — Telehealth (HOSPITAL_COMMUNITY): Payer: Self-pay | Admitting: Internal Medicine

## 2016-12-25 ENCOUNTER — Ambulatory Visit (HOSPITAL_COMMUNITY): Payer: Medicare Other | Admitting: Physical Therapy

## 2016-12-25 NOTE — Telephone Encounter (Signed)
12/25/16 pt called and wanted to come in later today but there was nothing available.  He is taking a load of food to the [employer].  I told him I would call him if anything opened up later today.

## 2016-12-27 ENCOUNTER — Ambulatory Visit (HOSPITAL_COMMUNITY): Payer: Medicare Other | Admitting: Physical Therapy

## 2016-12-27 DIAGNOSIS — R293 Abnormal posture: Secondary | ICD-10-CM

## 2016-12-27 DIAGNOSIS — M5416 Radiculopathy, lumbar region: Secondary | ICD-10-CM

## 2016-12-27 NOTE — Therapy (Signed)
[place], [place], [address] Phone: [phone]   Fax:  [phone]  Physical Therapy Treatment  Patient Details  Name: Jordan [name] MRN: [id number] Date of Birth: [birth year] Referring Provider: Victorino December  Encounter Date: 12/27/2016      PT End of Session - 12/27/16 1429    Visit Number 4   Number of Visits 8   Date for PT Re-Evaluation 01/11/17   Authorization Type BCBS medicare   Authorization - Visit Number 4   Authorization - Number of Visits 8   PT Start Time 4496   PT Stop Time 1429   PT Time Calculation (min) 40 min   Activity Tolerance Patient tolerated treatment well   Behavior During Therapy [facility] for tasks assessed/performed      Past Medical History:  Diagnosis Date  . Arthritis   . BPH (benign prostatic hypertrophy)   . CAD (coronary artery disease) no cardiologist--  sees pcp  dr fagan   s/p  PCI and stenting pRCA 2009  . Diverticulosis of colon   . Fecal urgency   . History of adenomatous polyp of colon    tubular adenoma's 05/ 2016  . Hyperlipidemia   . Hypertension   . Lower urinary tract symptoms (LUTS)   . S/P drug eluting coronary stent placement    12-23-2007  pRCA  . Type 2 diabetes mellitus ([place])   . Wears glasses     Past Surgical History:  Procedure Laterality Date  . CATARACT EXTRACTION W/ INTRAOCULAR LENS  IMPLANT, BILATERAL    . COLONOSCOPY  last one 11/16/2014   polpectomy  . CORONARY ANGIOPLASTY WITH STENT PLACEMENT  12-23-2007  dr hochrein   (abnormal myoview)  PCI w/ DES x1 pRCA (95%),  pCFX 25%,  mCFX 40-50%,  after PDA 30%,  prox, mid, and ostial LAD  30% calcification,  LM diffuse 25% calcification,  ef 60%  . EVALUATION UNDER ANESTHESIA WITH ANAL FISTULECTOMY [name] 06/03/2015   Procedure: ANAL EXAM UNDER ANESTHESIA ,FISTULOTOMY ;  Surgeon: [name], MD;  Location: [place];  Service: General;  Laterality: [name];  . INGUINAL HERNIA  REPAIR Right 2001  . TRANSTHORACIC ECHOCARDIOGRAM  12-06-2007   normal LVF, ef 60%/  mild to moderate  AV calcification without stenosis,  mild AR and TR,  mild LAE    There were no vitals filed for this visit.      Subjective Assessment - 12/27/16 1349    Subjective PT feels that he is doing better    Pertinent History OA,, CAD, DM   Limitations Walking;House hold activities;Standing   How long can you sit comfortably? sitting doesn't change pain    How long can you stand comfortably? does not change pain    How long can you walk comfortably? does not change pain    Pain Score 4    Pain Location Back   Pain Orientation Lower   Pain Descriptors / Indicators Aching   Pain Onset More than a month ago   Aggravating Factors  not sure    Pain Relieving Factors not susre    Effect of Pain on Daily Activities no change                          [facility] Adult PT Treatment/Exercise - 12/27/16 0001      Lumbar Exercises: Stretches   Active Hamstring Stretch 3 reps;30 seconds   Active Hamstring  Stretch Limitations supine   Single Knee to Chest Stretch 3 reps;30 seconds   Prone on Elbows Stretch 3 reps;30 seconds   Piriformis Stretch 3 reps;30 seconds   Piriformis Stretch Limitations bilaterally in seated      Lumbar Exercises: Standing   Other Standing Lumbar Exercises wall arch, wall Y, and pushup x 10      Lumbar Exercises: Supine   Ab Set 10 reps   Bent Knee Raise 10 reps  on foam roll   Bridge 10 reps   Other Supine Lumbar Exercises on foam single arm lift x 10; dead bug x 10     Lumbar Exercises: Prone   Straight Leg Raise 10 reps   Other Prone Lumbar Exercises heelsqueeze 10 reps     Manual Therapy   Manual Therapy Soft tissue mobilization   Manual therapy comments done seperate from all other aspects of treatment   Soft tissue mobilization massage to paraspinal mm to decrease tightness to inclde efflurage and petrissage                   PT  Short Term Goals - 12/18/16 1344      PT SHORT TERM GOAL #1   Title Pt back pain level ongoing basis to be no greater than a 1/10 with his Rt radicular  shooting pain no greater than a 8/10 to allow pt to vacuum in comfort.    Time 2   Period Weeks   Status On-going     PT SHORT TERM GOAL #2   Title Pt to be able to demonstrate good body mechanics for bed mobility and lifting to decrease stress on lumbar spine    Time 2   Period Weeks   Status On-going     PT SHORT TERM GOAL #3   Title Pt to be able to single leg stance for 10 seconds to decrease risk of falling.    Time 2   Period Weeks   Status On-going           PT Long Term Goals - 12/18/16 1344      PT LONG TERM GOAL #1   Title Pt to have no ongoing back pain; Pt radicular pain to be occuring only once every two weeks with pain level no greater than a 5/10 to allow pt to be able to have no difficulty walking for 30 mintues.   Time 4   Period Weeks   Status On-going     PT LONG TERM GOAL #2   Title Pt to be able to demonstrate good lifting techinques from the trunk of a car or 12" from the floor to allow pt to be able to lift groceries out of his trunk without difficulty.    Time 4   Period Weeks   Status On-going     PT LONG TERM GOAL #3   Title Pt to understand that bending forward places increased stress on his low back and he should therefore not be touching his toes 30 times a day; nor should he be completing sit ups.  Pt to be educated in crunches and supine hamstring stretching instead.    Time 4   Period Weeks   Status On-going               Plan - 12/27/16 1429    Clinical Impression Statement Added prone stabilization and stretching exercises as well as foam roller with supine exercises.  Foam added intensity to program.  Rehab Potential Good   PT Frequency 2x / week   PT Duration 4 weeks   PT Treatment/Interventions ADLs/Self Care Home Management;Traction;Manual techniques;Therapeutic  exercise;Patient/family education   PT Next Visit Plan Progress prone exercises for back strengthening as tolerated.  Continue manual as needed.     PT Home Exercise Plan abdominal set, standing lumbar extension, hamstring stretch and decompression exercises 1-5.  12/20/16:  seated piriformis stretch   Consulted and Agree with Plan of Care Patient      Patient will benefit from skilled therapeutic intervention in order to improve the following deficits and impairments:  Decreased activity tolerance, Decreased balance, Impaired flexibility, Pain, Postural dysfunction  Visit Diagnosis: Radiculopathy, lumbar region  Abnormal posture     Problem List Patient Active Problem List   Diagnosis Date Noted  . Pancreatitis 06/13/2016  . Type 2 diabetes mellitus with complication ([place])   . S/P drug eluting coronary stent placement   . Hypertension   . Hyperlipidemia   . History of colonic polyps   . Diverticulosis of colon without hemorrhage   . Hemorrhoids 10/23/2014  . Constipation 06/03/2012  . Balance disorder 05/29/2012    Rayetta Humphrey, PT CLT [phone] 12/27/2016, 2:31 PM  [facility] [address] [place], [place], [address] Phone: [phone]   Fax:  [phone]  Name: Jordan [name] MRN: [id number] Date of Birth: [birth year]

## 2017-01-01 ENCOUNTER — Encounter (HOSPITAL_COMMUNITY): Payer: Self-pay

## 2017-01-01 ENCOUNTER — Ambulatory Visit (HOSPITAL_COMMUNITY): Payer: Medicare Other

## 2017-01-01 DIAGNOSIS — M5416 Radiculopathy, lumbar region: Secondary | ICD-10-CM

## 2017-01-01 DIAGNOSIS — R293 Abnormal posture: Secondary | ICD-10-CM

## 2017-01-01 NOTE — Therapy (Signed)
[place], [place], [address] Phone: [phone]   Fax:  [phone]  Physical Therapy Treatment  Patient Details  Name: Jordan [name] MRN: [id number] Date of Birth: 1923-02-04 Referring Provider: Victorino December  Encounter Date: 01/01/2017      PT End of Session - 01/01/17 1303    Visit Number 5   Number of Visits 8   Date for PT Re-Evaluation 01/11/17   Authorization Type [employer]BS medicare   Authorization - Visit Number 5   Authorization - Number of Visits 8   PT Start Time 1300   PT Stop Time 1341   PT Time Calculation (min) 41 min   Activity Tolerance Patient tolerated treatment well   Behavior During Therapy [facility] for tasks assessed/performed      Past Medical History:  Diagnosis Date  . Arthritis   . BPH (benign prostatic hypertrophy)   . CAD (coronary artery disease) no cardiologist--  sees pcp  dr fagan   s/p  PCI and stenting pRCA 2009  . Diverticulosis of colon   . Fecal urgency   . History of adenomatous polyp of colon    tubular adenoma's 05/ 2016  . Hyperlipidemia   . Hypertension   . Lower urinary tract symptoms (LUTS)   . S/P drug eluting coronary stent placement    12-23-2007  pRCA  . Type 2 diabetes mellitus ([place])   . Wears glasses     Past Surgical History:  Procedure Laterality Date  . CATARACT EXTRACTION W/ INTRAOCULAR LENS  IMPLANT, BILATERAL    . COLONOSCOPY  last one 11/16/2014   polpectomy  . CORONARY ANGIOPLASTY WITH STENT PLACEMENT  12-23-2007  dr hochrein   (abnormal myoview)  PCI w/ DES x1 pRCA (95%),  pCFX 25%,  mCFX 40-50%,  after PDA 30%,  prox, mid, and ostial LAD  30% calcification,  LM diffuse 25% calcification,  ef 60%  . EVALUATION UNDER ANESTHESIA WITH ANAL FISTULECTOMY [name] 06/03/2015   Procedure: ANAL EXAM UNDER ANESTHESIA ,FISTULOTOMY ;  Surgeon: [name], MD;  Location: [facility];  Service: General;  Laterality: [name];  . INGUINAL HERNIA  REPAIR Right 2001  . TRANSTHORACIC ECHOCARDIOGRAM  12-06-2007   normal LVF, ef 60%/  mild to moderate  AV calcification without stenosis,  mild AR and TR,  mild LAE    There were no vitals filed for this visit.      Subjective Assessment - 01/01/17 1303    Subjective Pt states that he is feeling good today. His back pain is no more than a 3 right now.   Pertinent History OA,, CAD, DM   Limitations Walking;House hold activities;Standing   How long can you sit comfortably? sitting doesn't change pain    How long can you stand comfortably? does not change pain    How long can you walk comfortably? does not change pain    Currently in Pain? Yes   Pain Score 3    Pain Location Back   Pain Orientation Lower   Pain Descriptors / Indicators Aching   Pain Type Chronic pain   Pain Onset More than a month ago   Pain Frequency Constant   Aggravating Factors  not sure   Pain Relieving Factors not sure   Effect of Pain on Daily Activities no change               [facility] Adult PT Treatment/Exercise - 01/01/17 0001  Lumbar Exercises: Stretches   Active Hamstring Stretch 3 reps;30 seconds   Active Hamstring Stretch Limitations supine with rope   Single Knee to Chest Stretch 3 reps;30 seconds     Lumbar Exercises: Standing   Other Standing Lumbar Exercises bil SLS 10 reps x 10 sec holds each with decreasing UE support   Other Standing Lumbar Exercises bil SLS and pulldowns with RTB x 10 reps each     Lumbar Exercises: Seated   Sit to Stand 10 reps   Sit to Stand Limitations 2 sets from chair with no UE support     Lumbar Exercises: Supine   Bent Knee Raise 10 reps   Bent Knee Raise Limitations with ab set     Lumbar Exercises: Quadruped   Opposite Arm/Leg Raise Right arm/Left leg;Left arm/Right leg;10 reps     Manual Therapy   Manual Therapy Soft tissue mobilization   Manual therapy comments done seperate from all other aspects of treatment   Soft tissue mobilization  IASTM with weighted ball to lumbar paraspinals and R glutes x 10 mins total              PT Education - 01/01/17 1348    Education provided Yes   Education Details exercise technique   Person(s) Educated Patient   Methods Explanation;Demonstration   Comprehension Verbalized understanding;Returned demonstration          PT Short Term Goals - 12/18/16 1344      PT SHORT TERM GOAL #1   Title Pt back pain level ongoing basis to be no greater than a 1/10 with his Rt radicular  shooting pain no greater than a 8/10 to allow pt to vacuum in comfort.    Time 2   Period Weeks   Status On-going     PT SHORT TERM GOAL #2   Title Pt to be able to demonstrate good body mechanics for bed mobility and lifting to decrease stress on lumbar spine    Time 2   Period Weeks   Status On-going     PT SHORT TERM GOAL #3   Title Pt to be able to single leg stance for 10 seconds to decrease risk of falling.    Time 2   Period Weeks   Status On-going           PT Long Term Goals - 12/18/16 1344      PT LONG TERM GOAL #1   Title Pt to have no ongoing back pain; Pt radicular pain to be occuring only once every two weeks with pain level no greater than a 5/10 to allow pt to be able to have no difficulty walking for 30 mintues.   Time 4   Period Weeks   Status On-going     PT LONG TERM GOAL #2   Title Pt to be able to demonstrate good lifting techinques from the trunk of a car or 12" from the floor to allow pt to be able to lift groceries out of his trunk without difficulty.    Time 4   Period Weeks   Status On-going     PT LONG TERM GOAL #3   Title Pt to understand that bending forward places increased stress on his low back and he should therefore not be touching his toes 30 times a day; nor should he be completing sit ups.  Pt to be educated in crunches and supine hamstring stretching instead.    Time 4   Period Weeks  Status On-going               Plan - 01/01/17 1348     Clinical Impression Statement Began session with IASTM to lumbar paraspinals and R glutes; he reported that this significantly decreased his pain. Progressed pt's core stabilization and hip strengthening this date. He did very well throughout the entire session and reported no LBP during any of the exercises. He even mentioned that he was surprised he could do some of the exercises without pain. Pt did report falling on Friday at home when he was going into his house with groceries in his hand. He had an ace wrap on his wrist but other than that, he states he was not injured and his LBP was not aggravated following it. Pain 0/10 at EOS.   Rehab Potential Good   PT Frequency 2x / week   PT Duration 4 weeks   PT Treatment/Interventions ADLs/Self Care Home Management;Traction;Manual techniques;Therapeutic exercise;Patient/family education   PT Next Visit Plan Progress prone exercises for back strengthening as tolerated. Progress balance and dynamic stability to pt's tolerance. Continue IASTM manual to R glutes PRN as pt reported this significantly decreased his pain.   PT Home Exercise Plan abdominal set, standing lumbar extension, hamstring stretch and decompression exercises 1-5.  12/20/16:  seated piriformis stretch   Consulted and Agree with Plan of Care Patient      Patient will benefit from skilled therapeutic intervention in order to improve the following deficits and impairments:  Decreased activity tolerance, Decreased balance, Impaired flexibility, Pain, Postural dysfunction  Visit Diagnosis: Radiculopathy, lumbar region  Abnormal posture     Problem List Patient Active Problem List   Diagnosis Date Noted  . Pancreatitis 06/13/2016  . Type 2 diabetes mellitus with complication ([place])   . S/P drug eluting coronary stent placement   . Hypertension   . Hyperlipidemia   . History of colonic polyps   . Diverticulosis of colon without hemorrhage   . Hemorrhoids 10/23/2014  .  Constipation 06/03/2012  . Balance disorder 05/29/2012     Geraldine Solar PT, DPT   [facility] [address] [place], [place], [address] Phone: [phone]   Fax:  [phone]  Name: Jordan [name] MRN: [id number] Date of Birth: 1923/03/14

## 2017-01-03 ENCOUNTER — Ambulatory Visit (HOSPITAL_COMMUNITY): Payer: Medicare Other | Admitting: Physical Therapy

## 2017-01-03 DIAGNOSIS — M5416 Radiculopathy, lumbar region: Secondary | ICD-10-CM

## 2017-01-03 DIAGNOSIS — R293 Abnormal posture: Secondary | ICD-10-CM

## 2017-01-03 NOTE — Therapy (Signed)
[place], [place], [address] Phone: [phone]   Fax:  [phone]  Physical Therapy Treatment  Patient Details  Name: Jordan [name] MRN: [id number] Date of Birth: Apr 01, 1923 Referring Provider: Victorino December  Encounter Date: 01/03/2017      PT End of Session - 01/03/17 1335    Visit Number 6   Number of Visits 8   Date for PT Re-Evaluation 01/11/17   Authorization Type [employer] medicare   Authorization - Visit Number 6   Authorization - Number of Visits 8   PT Start Time 1287   PT Stop Time 1344   PT Time Calculation (min) 39 min   Activity Tolerance Patient tolerated treatment well   Behavior During Therapy [facility] for tasks assessed/performed      Past Medical History:  Diagnosis Date  . Arthritis   . BPH (benign prostatic hypertrophy)   . CAD (coronary artery disease) no cardiologist--  sees pcp  dr fagan   s/p  PCI and stenting pRCA 2009  . Diverticulosis of colon   . Fecal urgency   . History of adenomatous polyp of colon    tubular adenoma's 05/ 2016  . Hyperlipidemia   . Hypertension   . Lower urinary tract symptoms (LUTS)   . S/P drug eluting coronary stent placement    12-23-2007  pRCA  . Type 2 diabetes mellitus ([place])   . Wears glasses     Past Surgical History:  Procedure Laterality Date  . CATARACT EXTRACTION W/ INTRAOCULAR LENS  IMPLANT, BILATERAL    . COLONOSCOPY  last one 11/16/2014   polpectomy  . CORONARY ANGIOPLASTY WITH STENT PLACEMENT  12-23-2007  dr hochrein   (abnormal myoview)  PCI w/ DES x1 pRCA (95%),  pCFX 25%,  mCFX 40-50%,  after PDA 30%,  prox, mid, and ostial LAD  30% calcification,  LM diffuse 25% calcification,  ef 60%  . EVALUATION UNDER ANESTHESIA WITH ANAL FISTULECTOMY [name] 06/03/2015   Procedure: ANAL EXAM UNDER ANESTHESIA ,FISTULOTOMY ;  Surgeon: [name], MD;  Location: [facility];  Service: General;  Laterality: [name];  . INGUINAL HERNIA  REPAIR Right 2001  . TRANSTHORACIC ECHOCARDIOGRAM  12-06-2007   normal LVF, ef 60%/  mild to moderate  AV calcification without stenosis,  mild AR and TR,  mild LAE    There were no vitals filed for this visit.      Subjective Assessment - 01/03/17 1322    Subjective Pt states that most of his pain is on his right side and goes into his buttock    Pertinent History OA,, CAD, DM   Limitations Walking;House hold activities;Standing   How long can you sit comfortably? sitting doesn't change pain    How long can you stand comfortably? does not change pain    How long can you walk comfortably? does not change pain    Pain Score 3    Pain Location Back   Pain Orientation Right   Pain Descriptors / Indicators Sharp   Pain Type Acute pain   Pain Onset More than a month ago   Pain Frequency Intermittent   Aggravating Factors  not sure   Pain Relieving Factors improved    Effect of Pain on Daily Activities improved                          [facility] Adult PT Treatment/Exercise - 01/03/17 0001  Lumbar Exercises: Stretches   Active Hamstring Stretch 3 reps;30 seconds   Active Hamstring Stretch Limitations supine with rope   Single Knee to Chest Stretch 3 reps;30 seconds   Standing Extension 5 reps   Prone on Elbows Stretch 3 reps;30 seconds   Quad Stretch 3 reps;30 seconds   Piriformis Stretch 1 rep;60 seconds   Piriformis Stretch Limitations quadriped      Lumbar Exercises: Standing   Other Standing Lumbar Exercises wall arch, wall Y, and pushup x 10      Lumbar Exercises: Seated   Sit to Stand 10 reps   Sit to Stand Limitations 2 sets from chair with no UE support     Lumbar Exercises: Supine   Bent Knee Raise --   Bent Knee Raise Limitations --     Lumbar Exercises: Quadruped   Opposite Arm/Leg Raise --     Manual Therapy   Manual Therapy Soft tissue mobilization   Manual therapy comments done seperate from all other aspects of treatment   Soft tissue  mobilization To B lumbar paraspinal mm, efflurage, petrissage and rolling                   PT Short Term Goals - 12/18/16 1344      PT SHORT TERM GOAL #1   Title Pt back pain level ongoing basis to be no greater than a 1/10 with his Rt radicular  shooting pain no greater than a 8/10 to allow pt to vacuum in comfort.    Time 2   Period Weeks   Status On-going     PT SHORT TERM GOAL #2   Title Pt to be able to demonstrate good body mechanics for bed mobility and lifting to decrease stress on lumbar spine    Time 2   Period Weeks   Status On-going     PT SHORT TERM GOAL #3   Title Pt to be able to single leg stance for 10 seconds to decrease risk of falling.    Time 2   Period Weeks   Status On-going           PT Long Term Goals - 12/18/16 1344      PT LONG TERM GOAL #1   Title Pt to have no ongoing back pain; Pt radicular pain to be occuring only once every two weeks with pain level no greater than a 5/10 to allow pt to be able to have no difficulty walking for 30 mintues.   Time 4   Period Weeks   Status On-going     PT LONG TERM GOAL #2   Title Pt to be able to demonstrate good lifting techinques from the trunk of a car or 12" from the floor to allow pt to be able to lift groceries out of his trunk without difficulty.    Time 4   Period Weeks   Status On-going     PT LONG TERM GOAL #3   Title Pt to understand that bending forward places increased stress on his low back and he should therefore not be touching his toes 30 times a day; nor should he be completing sit ups.  Pt to be educated in crunches and supine hamstring stretching instead.    Time 4   Period Weeks   Status On-going               Plan - 01/03/17 1336    Clinical Impression Statement Pt has treatment continues with  manual to decrease tension in lumbar paraspinal mm.  Pt treatment focused on stretchint of tight mm.  PT given HEP for piriformis exercise.    Rehab Potential Good   PT  Frequency 2x / week   PT Duration 4 weeks   PT Treatment/Interventions ADLs/Self Care Home Management;Traction;Manual techniques;Therapeutic exercise;Patient/family education   PT Next Visit Plan Pt gluteal mm tested with good strength will concentrate on stretching and manual for the last two visis.    PT Home Exercise Plan abdominal set, standing lumbar extension, hamstring stretch and decompression exercises 1-5.  12/20/16:  seated piriformis stretch   Consulted and Agree with Plan of Care Patient      Patient will benefit from skilled therapeutic intervention in order to improve the following deficits and impairments:  Decreased activity tolerance, Decreased balance, Impaired flexibility, Pain, Postural dysfunction  Visit Diagnosis: Radiculopathy, lumbar region  Abnormal posture     Problem List Patient Active Problem List   Diagnosis Date Noted  . Pancreatitis 06/13/2016  . Type 2 diabetes mellitus with complication ([place])   . S/P drug eluting coronary stent placement   . Hypertension   . Hyperlipidemia   . History of colonic polyps   . Diverticulosis of colon without hemorrhage   . Hemorrhoids 10/23/2014  . Constipation 06/03/2012  . Balance disorder 05/29/2012   Rayetta Humphrey, PT CLT [phone] 01/03/2017, 1:44 PM  [facility] [address] [place], [place], [address] Phone: [phone]   Fax:  [phone]  Name: Jordan [name] MRN: [id number] Date of Birth: 1922/08/08

## 2017-01-03 NOTE — Patient Instructions (Addendum)
Piriformis Stretch (All-Fours)    With right leg crossed in front, slide other leg back, lowering hips until stretch is felt. Repeat _1-2___ times per set. Do _1___ sets per session. Do _1___ sessions per day.  http://orth.exer.us/292   Copyright  VHI. All rights reserved.  Piriformis Stretch, Sitting    Sit, one ankle on opposite knee, same-side hand on crossed knee. Push down on knee, keeping spine straight. Lean torso forward, with flat back, until tension is felt in hamstrings and gluteals of crossed-leg side. Hold _30__ seconds.  Repeat __3_ times per session. Do __1_ sessions per day.  Copyright  VHI. All rights reserved.  On Elbows (Prone)    Rise up on elbows as high as possible, keeping hips on floor. Hold __60__ seconds. Repeat _3___ times per set. Do ___1_ sets per session. Do __2__ sessions per day.  http://orth.exer.us/92   Copyright  VHI. All rights reserved.

## 2017-01-08 ENCOUNTER — Ambulatory Visit (HOSPITAL_COMMUNITY): Payer: Medicare Other

## 2017-01-08 ENCOUNTER — Encounter (HOSPITAL_COMMUNITY): Payer: Self-pay

## 2017-01-08 DIAGNOSIS — M5416 Radiculopathy, lumbar region: Secondary | ICD-10-CM | POA: Diagnosis not present

## 2017-01-08 DIAGNOSIS — R293 Abnormal posture: Secondary | ICD-10-CM

## 2017-01-08 NOTE — Therapy (Signed)
[place], [place], [address] Phone: [phone]   Fax:  [phone]  Physical Therapy Treatment  Patient Details  Name: Jordan [name] MRN: [id number] Date of Birth: 19-Jul-1922 Referring Provider: Victorino December  Encounter Date: 01/08/2017      PT End of Session - 01/08/17 1304    Visit Number 7   Number of Visits 8   Date for PT Re-Evaluation 01/11/17   Authorization Type [employer] medicare   Authorization - Visit Number 7   Authorization - Number of Visits 8   PT Start Time 1301   PT Stop Time 1342   PT Time Calculation (min) 41 min   Activity Tolerance Patient tolerated treatment well   Behavior During Therapy [facility] for tasks assessed/performed      Past Medical History:  Diagnosis Date  . Arthritis   . BPH (benign prostatic hypertrophy)   . CAD (coronary artery disease) no cardiologist--  sees pcp  dr fagan   s/p  PCI and stenting pRCA 2009  . Diverticulosis of colon   . Fecal urgency   . History of adenomatous polyp of colon    tubular adenoma's 05/ 2016  . Hyperlipidemia   . Hypertension   . Lower urinary tract symptoms (LUTS)   . S/P drug eluting coronary stent placement    12-23-2007  pRCA  . Type 2 diabetes mellitus ([place])   . Wears glasses     Past Surgical History:  Procedure Laterality Date  . CATARACT EXTRACTION W/ INTRAOCULAR LENS  IMPLANT, BILATERAL    . COLONOSCOPY  last one 11/16/2014   polpectomy  . CORONARY ANGIOPLASTY WITH STENT PLACEMENT  12-23-2007  dr hochrein   (abnormal myoview)  PCI w/ DES x1 pRCA (95%),  pCFX 25%,  mCFX 40-50%,  after PDA 30%,  prox, mid, and ostial LAD  30% calcification,  LM diffuse 25% calcification,  ef 60%  . EVALUATION UNDER ANESTHESIA WITH ANAL FISTULECTOMY [name] 06/03/2015   Procedure: ANAL EXAM UNDER ANESTHESIA ,FISTULOTOMY ;  Surgeon: [name], MD;  Location: [facility];  Service: General;  Laterality: [name];  . INGUINAL HERNIA  REPAIR Right 2001  . TRANSTHORACIC ECHOCARDIOGRAM  12-06-2007   normal LVF, ef 60%/  mild to moderate  AV calcification without stenosis,  mild AR and TR,  mild LAE    There were no vitals filed for this visit.      Subjective Assessment - 01/08/17 1303    Subjective Pt states that he is doing very well today. He states he had a little soreness when he woke up this morning but he feels better now that he's been up and moving.   Pertinent History OA,, CAD, DM   Limitations Walking;House hold activities;Standing   How long can you sit comfortably? sitting doesn't change pain    How long can you stand comfortably? does not change pain    How long can you walk comfortably? does not change pain    Currently in Pain? No/denies   Pain Onset More than a month ago                [facility] Adult PT Treatment/Exercise - 01/08/17 0001      Lumbar Exercises: Stretches   Active Hamstring Stretch 3 reps;30 seconds   Active Hamstring Stretch Limitations supine with rope   Single Knee to Chest Stretch 3 reps;30 seconds   Prone on Elbows Stretch Limitations 2 mins   Quad Stretch  3 reps;30 seconds   Quad Stretch Limitations prone with rope   ITB Stretch Limitations child's pose 3x30 seconds, and leaning R and L in childs pose 1x30 sec each     Manual Therapy   Manual Therapy Soft tissue mobilization   Manual therapy comments done seperate from all other aspects of treatment   Soft tissue mobilization IASTM with orange massage ball x 15 mins to bil lumbar paraspinals and glutes              PT Education - 01/08/17 1341    Education provided Yes   Education Details proper technique with stretching   Person(s) Educated Patient   Methods Explanation;Demonstration   Comprehension Verbalized understanding;Returned demonstration          PT Short Term Goals - 12/18/16 1344      PT SHORT TERM GOAL #1   Title Pt back pain level ongoing basis to be no greater than a 1/10 with his Rt  radicular  shooting pain no greater than a 8/10 to allow pt to vacuum in comfort.    Time 2   Period Weeks   Status On-going     PT SHORT TERM GOAL #2   Title Pt to be able to demonstrate good body mechanics for bed mobility and lifting to decrease stress on lumbar spine    Time 2   Period Weeks   Status On-going     PT SHORT TERM GOAL #3   Title Pt to be able to single leg stance for 10 seconds to decrease risk of falling.    Time 2   Period Weeks   Status On-going           PT Long Term Goals - 12/18/16 1344      PT LONG TERM GOAL #1   Title Pt to have no ongoing back pain; Pt radicular pain to be occuring only once every two weeks with pain level no greater than a 5/10 to allow pt to be able to have no difficulty walking for 30 mintues.   Time 4   Period Weeks   Status On-going     PT LONG TERM GOAL #2   Title Pt to be able to demonstrate good lifting techinques from the trunk of a car or 12" from the floor to allow pt to be able to lift groceries out of his trunk without difficulty.    Time 4   Period Weeks   Status On-going     PT LONG TERM GOAL #3   Title Pt to understand that bending forward places increased stress on his low back and he should therefore not be touching his toes 30 times a day; nor should he be completing sit ups.  Pt to be educated in crunches and supine hamstring stretching instead.    Time 4   Period Weeks   Status On-going               Plan - 01/08/17 1339    Clinical Impression Statement Per POC, session focused on manual and stretching. Began session with IASTM with orange ball to lumbar paraspinals and gluteals, which he reported feeling better following. Then had pt perform stretching and added in child's pose to stretch pt's lumbar paraspinals. Pt did not c/o LBP throughout session and demonstrated understanding of all stretches with minimal to no cueing required. Continue POC for pt's last visit and provide handout of stretches to  add to HEP.   Rehab Potential  Good   PT Frequency 2x / week   PT Duration 4 weeks   PT Treatment/Interventions ADLs/Self Care Home Management;Traction;Manual techniques;Therapeutic exercise;Patient/family education   PT Next Visit Plan continue to focus on stretching and manual for the last visit; provide handout of stretches for HEP   PT Home Exercise Plan abdominal set, standing lumbar extension, hamstring stretch and decompression exercises 1-5.  12/20/16:  seated piriformis stretch   Consulted and Agree with Plan of Care Patient      Patient will benefit from skilled therapeutic intervention in order to improve the following deficits and impairments:  Decreased activity tolerance, Decreased balance, Impaired flexibility, Pain, Postural dysfunction  Visit Diagnosis: Radiculopathy, lumbar region  Abnormal posture     Problem List Patient Active Problem List   Diagnosis Date Noted  . Pancreatitis 06/13/2016  . Type 2 diabetes mellitus with complication ([place])   . S/P drug eluting coronary stent placement   . Hypertension   . Hyperlipidemia   . History of colonic polyps   . Diverticulosis of colon without hemorrhage   . Hemorrhoids 10/23/2014  . Constipation 06/03/2012  . Balance disorder 05/29/2012     Geraldine Solar PT, DPT   [facility] [address] [place], [place], [address] Phone: [phone]   Fax:  [phone]  Name: Jordan [name] MRN: [id number] Date of Birth: February 28, 1923

## 2017-01-11 ENCOUNTER — Ambulatory Visit (HOSPITAL_COMMUNITY): Payer: Medicare Other | Admitting: Physical Therapy

## 2017-01-11 ENCOUNTER — Telehealth (HOSPITAL_COMMUNITY): Payer: Self-pay | Admitting: Physical Therapy

## 2017-01-11 NOTE — Telephone Encounter (Signed)
Called pt re missed appointment.  Pt had forgotten will be here Monday.  Rayetta Humphrey, [place] CLT [phone]

## 2017-01-15 ENCOUNTER — Ambulatory Visit (HOSPITAL_COMMUNITY): Payer: Medicare Other | Attending: Orthopedic Surgery | Admitting: Physical Therapy

## 2017-01-15 DIAGNOSIS — M5416 Radiculopathy, lumbar region: Secondary | ICD-10-CM | POA: Insufficient documentation

## 2017-01-15 DIAGNOSIS — R293 Abnormal posture: Secondary | ICD-10-CM | POA: Insufficient documentation

## 2017-01-15 NOTE — Therapy (Signed)
[place], [place], [address] Phone: [phone]   Fax:  [phone]  Physical Therapy Treatment/Discharge  Patient Details  Name: Jordan [name] MRN: [id number] Date of Birth: 01-10-1923 Referring Provider: Victorino December  Encounter Date: 01/15/2017      PT End of Session - 01/15/17 0922    Visit Number 8   Number of Visits 8   Date for PT Re-Evaluation 01/11/17   Authorization Type [employer] medicare   Authorization - Visit Number 8   Authorization - Number of Visits 8   PT Start Time 0900   PT Stop Time 0945   PT Time Calculation (min) 45 min   Activity Tolerance Patient tolerated treatment well;No increased pain   Behavior During Therapy WFL for tasks assessed/performed      Past Medical History:  Diagnosis Date  . Arthritis   . BPH (benign prostatic hypertrophy)   . CAD (coronary artery disease) no cardiologist--  sees pcp  dr fagan   s/p  PCI and stenting pRCA 2009  . Diverticulosis of colon   . Fecal urgency   . History of adenomatous polyp of colon    tubular adenoma's 05/ 2016  . Hyperlipidemia   . Hypertension   . Lower urinary tract symptoms (LUTS)   . S/P drug eluting coronary stent placement    12-23-2007  pRCA  . Type 2 diabetes mellitus ([place])   . Wears glasses     Past Surgical History:  Procedure Laterality Date  . CATARACT EXTRACTION W/ INTRAOCULAR LENS  IMPLANT, BILATERAL    . COLONOSCOPY  last one 11/16/2014   polpectomy  . CORONARY ANGIOPLASTY WITH STENT PLACEMENT  12-23-2007  dr hochrein   (abnormal myoview)  PCI w/ DES x1 pRCA (95%),  pCFX 25%,  mCFX 40-50%,  after PDA 30%,  prox, mid, and ostial LAD  30% calcification,  LM diffuse 25% calcification,  ef 60%  . EVALUATION UNDER ANESTHESIA WITH ANAL FISTULECTOMY [name] 06/03/2015   Procedure: ANAL EXAM UNDER ANESTHESIA ,FISTULOTOMY ;  Surgeon: [name], MD;  Location: [facility];  Service: General;  Laterality:  [name];  . INGUINAL HERNIA REPAIR Right 2001  . TRANSTHORACIC ECHOCARDIOGRAM  12-06-2007   normal LVF, ef 60%/  mild to moderate  AV calcification without stenosis,  mild AR and TR,  mild LAE    There were no vitals filed for this visit.      Subjective Assessment - 01/15/17 0900    Subjective Pt reports that he is doing well. He feels that he is about 70% better overall. His pain is less intense, however he does still have some occasionally. He states that the pain is not necessarily related to certain activities. Pt reports that he no longer notices any shooting pain. He has not been going to the [facility] as regularly as he used to.    Pertinent History OA,, CAD, DM   Limitations House hold activities;Standing;Walking   How long can you sit comfortably? sitting doesn't change pain    How long can you stand comfortably? does not change pain    How long can you walk comfortably? does not change pain    Currently in Pain? Yes   Pain Score 2    Pain Location Hip   Pain Orientation Right   Pain Descriptors / Indicators Aching   Pain Type Acute pain   Pain Radiating Towards none    Pain Onset More than a month ago  Pain Frequency Intermittent   Aggravating Factors  not sure    Pain Relieving Factors massage and stretching    Effect of Pain on Daily Activities improved             [facility] PT Assessment - 01/15/17 0001      Assessment   Medical Diagnosis Rt sciatica    Referring Provider Victorino December   Onset Date/Surgical Date 09/14/16   Next MD Visit 01/23/2017   Prior Therapy not for this instance      Precautions   Precautions Fall     Restrictions   Weight Bearing Restrictions No     Balance Screen   Has the patient fallen in the past 6 months No   Has the patient had a decrease in activity level because of a fear of falling?  Yes   Is the patient reluctant to leave their home because of a fear of falling?  No     Home Ecologist residence      Prior Function   Level of Independence Independent with household mobility with device   Vocation Retired   Leisure works out at [employer]   Overall Cognitive Status Within [employer] for tasks assessed     Observation/Other Assessments   Focus on Therapeutic Outcomes (FOTO)  CK     Functional Tests   Functional tests Single leg stance;Sit to Stand     Single Leg Stance   Comments Rt: 12 seconds: Lt 15-20 seconds      Sit to Stand   Comments 5x sit to stand: 10 sec, no UE      Posture/Postural Control   Posture/Postural Control Postural limitations   Postural Limitations Rounded Shoulders;Forward head;Decreased lumbar lordosis     Flexibility   Soft Tissue Assessment /Muscle Length yes   Hamstrings LT 45 lacking ; RT 55 lacking      Palpation   Palpation comment tednerness and plapable muscle knotting throghout Rt glute region           [facility] Adult PT Treatment/Exercise - 01/15/17 0001      Lumbar Exercises: Stretches   Active Hamstring Stretch 2 reps;30 seconds   Active Hamstring Stretch Limitations supine with rope   Single Knee to Chest Stretch 3 reps;30 seconds   Prone on Elbows Stretch Limitations 2 mins   Quad Stretch 2 reps;30 seconds   Quad Stretch Limitations prone with rope     Lumbar Exercises: Supine   Other Supine Lumbar Exercises Pt completing self massage and trigger point release to Rt glute region x4 min end of session     Manual Therapy   Manual Therapy Soft tissue mobilization   Manual therapy comments separate rest of session   Soft tissue mobilization STM along Rt gluteal region           PT Education - 01/15/17 0919    Education provided Yes   Education Details reviewed goals and discussed importance of avoiding flexion exercises at the gym; reviewed stretching program and implemented for [employer]) Educated Patient   Methods Explanation;Demonstration;Verbal cues;Handout   Comprehension Verbalized  understanding;Returned demonstration          PT Short Term Goals - 01/15/17 0914      PT SHORT TERM GOAL #1   Title Pt back pain level ongoing basis to be no greater than a 1/10 with his Rt radicular  shooting pain no greater than  a 8/10 to allow pt to vacuum in comfort.    Baseline Pain no greater than 4/10   Time 2   Period Weeks   Status Achieved     PT SHORT TERM GOAL #2   Title Pt to be able to demonstrate good body mechanics for bed mobility and lifting to decrease stress on lumbar spine    Time 2   Period Weeks   Status On-going     PT SHORT TERM GOAL #3   Title Pt to be able to single leg stance for 10 seconds to decrease risk of falling.    Baseline Lt up to 20 sec; Rt up to 12 sec   Time 2   Period Weeks   Status Achieved           PT Long Term Goals - 01/15/17 0915      PT LONG TERM GOAL #1   Title Pt to have no ongoing back pain; Pt radicular pain to be occuring only once every two weeks with pain level no greater than a 5/10 to allow pt to be able to have no difficulty walking for 30 mintues.   Baseline 4/10   Time 4   Period Weeks   Status Achieved     PT LONG TERM GOAL #2   Title Pt to be able to demonstrate good lifting techinques from the trunk of a car or 12" from the floor to allow pt to be able to lift groceries out of his trunk without difficulty.    Baseline initial demo from PT, pt able to return demonstration   Time 4   Period Weeks   Status Achieved     PT LONG TERM GOAL #3   Title Pt to understand that bending forward places increased stress on his low back and he should therefore not be touching his toes 30 times a day; nor should he be completing sit ups.  Pt to be educated in crunches and supine hamstring stretching instead.    Time 4   Period Weeks   Status Achieved               Plan - 01/15/17 1055    Clinical Impression Statement Pt was discharged this visit having made good progress since beginning PT several weeks  ago. He has met all but 1 of his goals established by the evaluating therapist, with noted cuing required to initiate proper log roll technique. Pt has reported atleast 75% improvement since beginning PT, and reports consistent HEP adherence. Therapist spent time reviewing with the pt the importance of avoiding repeated flexion based exercises at the gym that will place increased stress on his low back. Also spent a portion of the session implementing and reviewing and HEP program to address limitations in flexibility. Ended with soft tissue techniques to decrease muscle spasm and tightness and educated pt on self-mobilization techniques for home. At this time, pt is in agreement with d/c and feels comfortable completing his HEP to further improve his flexibility and decrease Rt hip discomfort.    Rehab Potential Good   PT Frequency 2x / week   PT Duration 4 weeks   PT Treatment/Interventions ADLs/Self Care Home Management;Traction;Manual techniques;Therapeutic exercise;Patient/family education   PT Next Visit Plan continue to focus on stretching and manual for the last visit; provide handout of stretches for HEP   PT Home Exercise Plan abdominal set, standing lumbar extension, hamstring stretch and decompression exercises 1-5.  12/20/16:  seated piriformis stretch;  supine hamstring stretch 3x30 sec, prone quad stretch 3x30 sec, prone on elbows stretch up to 5 min, SKTC (3x30 sec each)     Consulted and Agree with Plan of Care Patient      Patient will benefit from skilled therapeutic intervention in order to improve the following deficits and impairments:  Decreased activity tolerance, Decreased balance, Impaired flexibility, Pain, Postural dysfunction  Visit Diagnosis: Radiculopathy, lumbar region  Abnormal posture       G-Codes - 19-Jan-2017 1100    Functional Assessment Tool Used (Outpatient Only) Clinical judgement based on assessment of flexibility and functional strength/balance   Functional  Limitation Other PT primary   Other PT Primary Goal Status (L8590) At least 20 percent but less than 40 percent impaired, limited or restricted   Other PT Primary Discharge Status (B3112) At least 20 percent but less than 40 percent impaired, limited or restricted      Problem List Patient Active Problem List   Diagnosis Date Noted  . Pancreatitis 06/13/2016  . Type 2 diabetes mellitus with complication ([place])   . S/P drug eluting coronary stent placement   . Hypertension   . Hyperlipidemia   . History of colonic polyps   . Diverticulosis of colon without hemorrhage   . Hemorrhoids 10/23/2014  . Constipation 06/03/2012  . Balance disorder 05/29/2012    PHYSICAL THERAPY DISCHARGE SUMMARY  Visits from Start of Care: 8  Current functional level related to goals / functional outcomes: See above for more details    Remaining deficits: See above for more details    Education / Equipment: See above for more details  Plan: Patient agrees to discharge.  Patient goals were met. Patient is being discharged due to meeting the stated rehab goals.  ?????     11:04 AM,01-19-2017 Elly Modena PT, DPT [facility] [place] [address] [place], [place], [address] Phone: [phone]   Fax:  [phone]  Name: Jordan [name] MRN: [id number] Date of Birth: 25-Feb-1923

## 2017-01-23 DIAGNOSIS — M5441 Lumbago with sciatica, right side: Secondary | ICD-10-CM | POA: Diagnosis not present

## 2017-01-23 DIAGNOSIS — M5136 Other intervertebral disc degeneration, lumbar region: Secondary | ICD-10-CM | POA: Diagnosis not present

## 2017-03-06 ENCOUNTER — Telehealth (HOSPITAL_COMMUNITY): Payer: Self-pay | Admitting: Physical Therapy

## 2017-03-06 DIAGNOSIS — E119 Type 2 diabetes mellitus without complications: Secondary | ICD-10-CM | POA: Diagnosis not present

## 2017-03-06 NOTE — Telephone Encounter (Signed)
Jordan [name] came in to dispute his bill and stated he only came to PT for 7 visits b/c he marked them on his calendar at home. After counting his apptment dates there werr 8 visits. Pt requested a print out of progress notes for him to take home.

## 2017-03-12 DIAGNOSIS — E119 Type 2 diabetes mellitus without complications: Secondary | ICD-10-CM | POA: Diagnosis not present

## 2017-03-12 DIAGNOSIS — I2510 Atherosclerotic heart disease of native coronary artery without angina pectoris: Secondary | ICD-10-CM | POA: Diagnosis not present

## 2017-03-12 DIAGNOSIS — I10 Essential (primary) hypertension: Secondary | ICD-10-CM | POA: Diagnosis not present

## 2017-04-12 DIAGNOSIS — M238X1 Other internal derangements of right knee: Secondary | ICD-10-CM | POA: Diagnosis not present

## 2017-04-12 DIAGNOSIS — M2241 Chondromalacia patellae, right knee: Secondary | ICD-10-CM | POA: Diagnosis not present

## 2017-04-13 ENCOUNTER — Encounter: Payer: Self-pay | Admitting: Cardiology

## 2017-04-13 NOTE — Progress Notes (Signed)
Cardiology Office Note  Date: 04/16/2017   ID: Jordan [name], DOB Dec 17, 1922, MRN [id number]  PCP: Asencion Noble, MD  Consulting Cardiologist: Rozann Lesches, MD   Chief Complaint  Patient presents with  . Coronary Artery Disease  . Aortic Stenosis    History of Present Illness: Jordan [name] is a 83 y.o. male former patient of Dr. Johnsie Cancel not seen in several years, referred to the office by Dr. Willey Blade for evaluation of CAD and aortic stenosis. I reviewed his records and updated the chart. He presents today stating that he has had more noticeable exertional fatigue over the last several months. Remarkably, at age 69 he continues to exercise 2-3 days a week at the [facility], goes through a routine of circuit training on various machines. With that level of activity he feels okay, but if he works outdoors by pushing mowing his yard or cutting trees he becomes exhausted more quickly. He has had no palpitations or syncope. Does not endorse any frank angina symptoms.  Echocardiogram from October 2017 demonstrated normal LVEF with moderate aortic stenosis and trivial aortic regurgitation. Mean and peak transvalvular gradients were only 13 mmHg and 22 mmHg respectively. I discussed this with him today.  We went over his current medications which are outlined below. From a cardiac perspective he is on aspirin, Vasotec, HCTZ, and Pravachol.  I personally reviewed his ECG today which shows sinus rhythm with left anterior fascicular block.  Past Medical History:  Diagnosis Date  . Arthritis   . BPH (benign prostatic hypertrophy)   . CAD (coronary artery disease)    DES to RCA 2009  . Diverticulosis of colon   . Essential hypertension   . Fecal urgency   . History of adenomatous polyp of colon    Tubular adenomas 2016  . Hyperlipidemia   . Lower urinary tract symptoms (LUTS)   . Type 2 diabetes mellitus ([place])   . Wears glasses     Past Surgical History:  Procedure Laterality Date  .  CATARACT EXTRACTION W/ INTRAOCULAR LENS  IMPLANT, BILATERAL    . COLONOSCOPY  last one 11/16/2014   polpectomy  . CORONARY ANGIOPLASTY WITH STENT PLACEMENT  12-23-2007  dr hochrein   (abnormal myoview)  PCI w/ DES x1 pRCA (95%),  pCFX 25%,  mCFX 40-50%,  after PDA 30%,  prox, mid, and ostial LAD  30% calcification,  LM diffuse 25% calcification,  ef 60%  . EVALUATION UNDER ANESTHESIA WITH ANAL FISTULECTOMY [name] 06/03/2015   Procedure: ANAL EXAM UNDER ANESTHESIA ,FISTULOTOMY ;  Surgeon: [name], MD;  Location: [facility];  Service: General;  Laterality: [name];  . INGUINAL HERNIA REPAIR Right 2001  . TRANSTHORACIC ECHOCARDIOGRAM  12-06-2007   normal LVF, ef 60%/  mild to moderate  AV calcification without stenosis,  mild AR and TR,  mild LAE    Current Outpatient Prescriptions  Medication Sig Dispense Refill  . aspirin 81 MG tablet Take 81 mg by mouth every evening.     . donepezil (ARICEPT) 10 MG tablet Take 10 mg by mouth at bedtime.     . enalapril (VASOTEC) 10 MG tablet Take 10 mg by mouth daily.    . hydrochlorothiazide (MICROZIDE) 12.5 MG capsule Take 12.5 mg by mouth daily.    . memantine (NAMENDA) 5 MG tablet Take 5 mg by mouth 2 (two) times daily.   11  . metFORMIN (GLUCOPHAGE) 500 MG tablet Take 500 mg by mouth 2 (two) times daily with  a meal.    . polyethylene glycol powder (GLYCOLAX/MIRALAX) powder Take 17 g by mouth daily. For a bowel movement (Patient taking differently: Take 17 g by mouth every evening. For a bowel movement) 255 g 3  . pravastatin (PRAVACHOL) 80 MG tablet Take 80 mg by mouth daily.    . tamsulosin (FLOMAX) 0.4 MG CAPS capsule Take 0.4 mg by mouth daily.     . Wheat Dextrin (BENEFIBER PO) Take by mouth daily.     No current facility-administered medications for this visit.    Allergies:  Patient has no known allergies.   Social History: The patient  reports that he quit smoking about 33 years ago. His smoking use included Cigarettes. He  quit after 30.00 years of use. He has never used smokeless tobacco. He reports that he does not drink alcohol or use drugs.   Family History: The patient's family history includes Heart disease in his brother.   ROS:  Please see the history of present illness. Otherwise, complete review of systems is positive for hearing loss.  All other systems are reviewed and negative.   Physical Exam: VS:  BP (!) 128/58   Pulse 78   Ht 5' 7"  (1.702 m)   Wt 149 lb (67.6 kg)   SpO2 98%   BMI 23.34 kg/m , BMI Body mass index is 23.34 kg/m.  Wt Readings from Last 3 Encounters:  04/16/17 149 lb (67.6 kg)  06/14/16 143 lb 15.4 oz (65.3 kg)  06/03/15 137 lb 5 oz (62.3 kg)    General: Elderly male, appears comfortable at rest. HEENT: Conjunctiva and lids normal, oropharynx clear. Neck: Supple, no elevated JVP or carotid bruits, no thyromegaly. Lungs: Clear to auscultation, nonlabored breathing at rest. Cardiac: Regular rate and rhythm, no S3, 3/6 systolic murmur, no pericardial rub. Abdomen: Soft, nontender, bowel sounds present, no guarding or rebound. Extremities: No pitting edema, distal pulses 2+. Skin: Warm and dry. Musculoskeletal: No kyphosis. Neuropsychiatric: Alert and oriented x3, affect grossly appropriate.  ECG: I personally reviewed the tracing from 12/2/317 which showed normal sinus rhythm with left anterior fascicular block.  Recent Labwork: 06/19/2016: Hemoglobin 11.0; Platelets 239 06/20/2016: ALT 91; AST 48; BUN 26; Creatinine, Ser 0.75; Potassium 3.8; Sodium 135  August 2018: Hemoglobin A1c 6.24 July 2016: BUN when he 4, creatinine 0.87, potassium 4.5, AST 17, ALT 12, hemoglobin 12.1, platelets 331  Other Studies Reviewed Today:  Echocardiogram 04/17/2016: Study Conclusions  - Left ventricle: The cavity size was normal. Wall thickness was   increased in a pattern of mild LVH. Systolic function was   vigorous. The estimated ejection fraction was in the range of 65%   to  70%. Wall motion was normal; there were no regional wall   motion abnormalities. Doppler parameters are consistent with   abnormal left ventricular relaxation (grade 1 diastolic   dysfunction). - Aortic valve: Moderately calcified annulus. Probably trileaflet;   moderately calcified leaflets. There was moderate stenosis. There   was trivial regurgitation. Mean gradient (S): 13 mm Hg. Peak   gradient (S): 22 mm Hg. VTI ratio of LVOT to aortic valve: 0.48.   Valve area (VTI): 1.21 cm^2. Valve area (Vmax): 1.2 cm^2. - Mitral valve: Calcified annulus. There was trivial regurgitation. - Left atrium: The atrium was mildly dilated. - Right atrium: Central venous pressure (est): 3 mm Hg. - Atrial septum: No defect or patent foramen ovale was identified. - Tricuspid valve: There was mild regurgitation. - Pulmonary arteries: PA peak pressure: 29 mm  Hg (S). - Pericardium, extracardiac: There was no pericardial effusion.  Impressions:  - Mild LVH with LVEF 65-70%. Grade 1 diastolic dysfunction. Mild   left atrial enlargement. MAC with trivial mitral regurgitation.   Moderate calcific aortic stenosis as outlined above with trivial   aortic regurgitation. Mild tricuspid regurgitation with PASP   estimated 29 mmHg.  Assessment and Plan:  1. Exertional fatigue, most likely multifactorial at age 39 with known ischemic heart disease and aortic stenosis. We have discussed all of these issues today, and our first step will be obtaining a follow-up echocardiogram to quantify LVEF and reevaluate degree of aortic stenosis. Based on this information we can discuss follow-up and whether he would want to pursue other cardiac evaluation.  2. CAD status post DES to the RCA in 2009. Plan is to continue medical therapy for now pending further evaluation as noted above.  3. Overall moderate aortic stenosis with relatively low gradient by echo [place] last year.  4. Essential hypertension, blood pressure is well  controlled today.  5. Hyperlipidemia, continues on Pravachol.  Current medicines were reviewed with the patient today.   Orders Placed This Encounter  Procedures  . EKG 12-Lead  . ECHOCARDIOGRAM COMPLETE    Disposition: Call test results and arrange follow-up.  Signed, Satira Sark, MD, [facility] 04/16/2017 1:38 PM    [place] [facility] [address] [place], [place] [address] Phone: [phone]; Fax: [phone]

## 2017-04-16 ENCOUNTER — Encounter: Payer: Self-pay | Admitting: Cardiology

## 2017-04-16 ENCOUNTER — Ambulatory Visit (INDEPENDENT_AMBULATORY_CARE_PROVIDER_SITE_OTHER): Payer: Medicare Other | Admitting: Cardiology

## 2017-04-16 VITALS — BP 128/58 | HR 78 | Ht 67.0 in | Wt 149.0 lb

## 2017-04-16 DIAGNOSIS — I10 Essential (primary) hypertension: Secondary | ICD-10-CM | POA: Diagnosis not present

## 2017-04-16 DIAGNOSIS — E782 Mixed hyperlipidemia: Secondary | ICD-10-CM | POA: Diagnosis not present

## 2017-04-16 DIAGNOSIS — R0609 Other forms of dyspnea: Secondary | ICD-10-CM

## 2017-04-16 DIAGNOSIS — I25119 Atherosclerotic heart disease of native coronary artery with unspecified angina pectoris: Secondary | ICD-10-CM

## 2017-04-16 DIAGNOSIS — I350 Nonrheumatic aortic (valve) stenosis: Secondary | ICD-10-CM

## 2017-04-16 DIAGNOSIS — R0600 Dyspnea, unspecified: Secondary | ICD-10-CM

## 2017-04-16 NOTE — Patient Instructions (Signed)
Your physician wants you to follow-up in:  6 months with Dr.McDowell You will receive a reminder letter in the mail two months in advance. If you don't receive a letter, please call our office to schedule the follow-up appointment.    Your physician has requested that you have an echocardiogram. Echocardiography is a painless test that uses sound waves to create images of your heart. It provides your doctor with information about the size and shape of your heart and how well your heart's chambers and valves are working. This procedure takes approximately one hour. There are no restrictions for this procedure.     Your physician recommends that you continue on your current medications as directed. Please refer to the Current Medication list given to you today.     If you need a refill on your cardiac medications before your next appointment, please call your pharmacy.     No lab work or tests ordered today.        Thank you for choosing [facility] !

## 2017-04-19 DIAGNOSIS — Z23 Encounter for immunization: Secondary | ICD-10-CM | POA: Diagnosis not present

## 2017-04-20 ENCOUNTER — Telehealth: Payer: Self-pay

## 2017-04-20 ENCOUNTER — Ambulatory Visit (HOSPITAL_COMMUNITY)
Admission: RE | Admit: 2017-04-20 | Discharge: 2017-04-20 | Disposition: A | Discharge status: Home / Self Care | Payer: Medicare Other | Source: Ambulatory Visit | Attending: Cardiology | Admitting: Cardiology

## 2017-04-20 DIAGNOSIS — I351 Nonrheumatic aortic (valve) insufficiency: Secondary | ICD-10-CM | POA: Insufficient documentation

## 2017-04-20 DIAGNOSIS — E785 Hyperlipidemia, unspecified: Secondary | ICD-10-CM | POA: Diagnosis not present

## 2017-04-20 DIAGNOSIS — R0600 Dyspnea, unspecified: Secondary | ICD-10-CM

## 2017-04-20 DIAGNOSIS — R0609 Other forms of dyspnea: Secondary | ICD-10-CM

## 2017-04-20 DIAGNOSIS — E119 Type 2 diabetes mellitus without complications: Secondary | ICD-10-CM | POA: Insufficient documentation

## 2017-04-20 DIAGNOSIS — I10 Essential (primary) hypertension: Secondary | ICD-10-CM | POA: Diagnosis not present

## 2017-04-20 DIAGNOSIS — I350 Nonrheumatic aortic (valve) stenosis: Secondary | ICD-10-CM | POA: Diagnosis not present

## 2017-04-20 LAB — ECHOCARDIOGRAM COMPLETE
AV Area mean vel: 1.04 cm2
AV VEL mean LVOT/AV: 0.37
AV area mean vel ind: 0.58 cm2/m2
AV peak Index: 0.66
AV vel: 1.13
AVA: 1.13 cm2
AVAREAVTI: 1.19 cm2
AVAREAVTIIND: 0.63 cm2/m2
AVG: 15 mmHg
AVLVOTPG: 4 mmHg
AVPG: 26 mmHg
AVPHT: 474 ms
AVPKVEL: 253 cm/s
Ao pk vel: 0.42 m/s
CHL CUP MV DEC (S): 257
CHL CUP STROKE VOLUME: 36 mL
CHL CUP TV REG PEAK VELOCITY: 257 cm/s
DOP CAL AO MEAN VELOCITY: 180 cm/s
EERAT: 13.58
EWDT: 257 ms
FS: 52 % — AB (ref 28–44)
IV/PV OW: 1.07
LA diam index: 1.56 cm/m2
LA vol A4C: 52.3 ml
LA vol index: 26.6 mL/m2
LA vol: 47.6 mL
LASIZE: 28 mm
LEFT ATRIUM END SYS DIAM: 28 mm
LV TDI E'MEDIAL: 7.72
LV dias vol: 54 mL — AB (ref 62–150)
LV e' LATERAL: 7.51 cm/s
LV sys vol: 18 mL — AB
LVDIAVOLIN: 30 mL/m2
LVEEAVG: 13.58
LVEEMED: 13.58
LVOT VTI: 25.2 cm
LVOT area: 2.84 cm2
LVOT diameter: 19 mm
LVOT peak vel: 106 cm/s
LVOTSV: 72 mL
LVOTVTI: 0.4 cm
LVSYSVOLIN: 10 mL/m2
MV pk E vel: 102 m/s
MVPG: 4 mmHg
MVPKAVEL: 130 m/s
PW: 10.6 mm — AB (ref 0.6–1.1)
RV LATERAL S' VELOCITY: 11.6 cm/s
RV TAPSE: 22 mm
RV sys press: 29 mmHg
Simpson's disk: 66
TDI e' lateral: 7.51
TR max vel: 257 cm/s
VTI: 63.6 cm
Valve area index: 0.63

## 2017-04-20 NOTE — Telephone Encounter (Signed)
-----   Message from Satira Sark, MD sent at 04/20/2017  3:23 PM EDT ----- Results reviewed. Follow-up echocardiogram indicates normal LVEF 60-65% with mild aortic stenosis and regurgitation. Based on this we will see him back in 6 months to reassess symptoms, sooner if he notices progression. A copy of this test should be forwarded to Asencion Noble, MD.

## 2017-04-20 NOTE — Progress Notes (Signed)
*  PRELIMINARY RESULTS* Echocardiogram 2D Echocardiogram has been performed.  Samuel Germany 04/20/2017, 1:49 PM

## 2017-04-20 NOTE — Telephone Encounter (Signed)
Called pt., his son stated he was unavailable at this time, but to call back on Monday morning and her will be home.

## 2017-04-25 DIAGNOSIS — H35342 Macular cyst, hole, or pseudohole, left eye: Secondary | ICD-10-CM | POA: Diagnosis not present

## 2017-04-25 DIAGNOSIS — H353222 Exudative age-related macular degeneration, left eye, with inactive choroidal neovascularization: Secondary | ICD-10-CM | POA: Diagnosis not present

## 2017-04-25 DIAGNOSIS — H4322 Crystalline deposits in vitreous body, left eye: Secondary | ICD-10-CM | POA: Diagnosis not present

## 2017-04-25 DIAGNOSIS — H353112 Nonexudative age-related macular degeneration, right eye, intermediate dry stage: Secondary | ICD-10-CM | POA: Diagnosis not present

## 2017-06-13 DIAGNOSIS — R5381 Other malaise: Secondary | ICD-10-CM | POA: Diagnosis not present

## 2017-06-13 DIAGNOSIS — H26491 Other secondary cataract, right eye: Secondary | ICD-10-CM | POA: Diagnosis not present

## 2017-06-13 DIAGNOSIS — H353112 Nonexudative age-related macular degeneration, right eye, intermediate dry stage: Secondary | ICD-10-CM | POA: Diagnosis not present

## 2017-06-13 DIAGNOSIS — H353222 Exudative age-related macular degeneration, left eye, with inactive choroidal neovascularization: Secondary | ICD-10-CM | POA: Diagnosis not present

## 2017-06-20 DIAGNOSIS — H26491 Other secondary cataract, right eye: Secondary | ICD-10-CM | POA: Diagnosis not present

## 2017-06-22 DIAGNOSIS — I10 Essential (primary) hypertension: Secondary | ICD-10-CM | POA: Diagnosis not present

## 2017-06-22 DIAGNOSIS — E119 Type 2 diabetes mellitus without complications: Secondary | ICD-10-CM | POA: Diagnosis not present

## 2017-06-22 DIAGNOSIS — I2510 Atherosclerotic heart disease of native coronary artery without angina pectoris: Secondary | ICD-10-CM | POA: Diagnosis not present

## 2017-06-22 DIAGNOSIS — Z79899 Other long term (current) drug therapy: Secondary | ICD-10-CM | POA: Diagnosis not present

## 2017-06-24 IMAGING — CT CT ABD-PELV W/ CM
2 of 5 series · 12 of 46 positions shown, 14 images · IV contrast (iopamidol)
Comparison: CT of the thorax 06/05/2016 (which covered portions of
the upper abdomen). MRI of the abdomen with and without IV
gadolinium with MRCP on 06/15/2016.

CLINICAL DATA: [AGE] male with history of elevated liver
function tests, fever and abdominal pain for the past 2 weeks.

EXAM:
CT ABDOMEN AND PELVIS WITH CONTRAST
TECHNIQUE: Multidetector CT imaging of the abdomen and pelvis was performed
using the standard protocol following bolus administration of
intravenous contrast.
CONTRAST:  100mL WI2QWQ-ESS IOPAMIDOL (WI2QWQ-ESS) INJECTION 61%

[Series 2: axial st · axial · 0.71mm/px · z∈[-765,-355]mm · 9 of 94 slices shown, 11 images]
[im 6/94  soft-tissue]
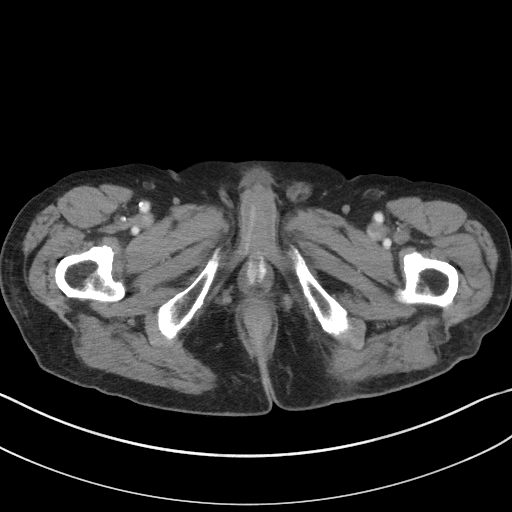
[im 6/94  bone]
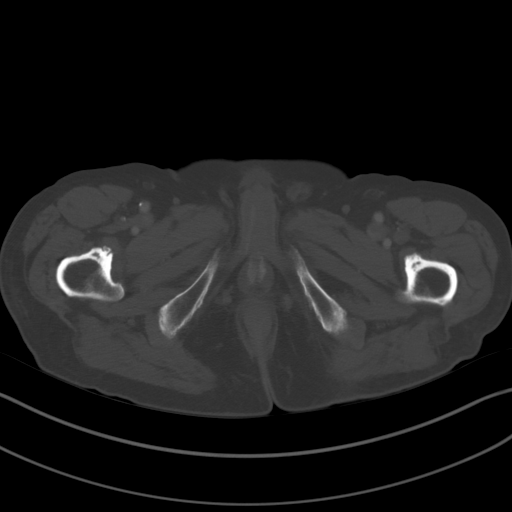
[im 18/94  soft-tissue]
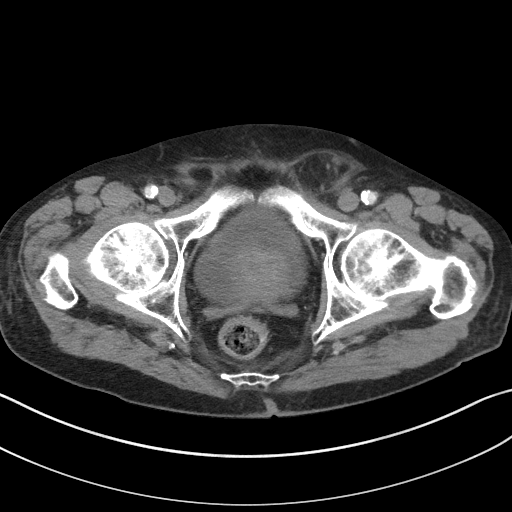
[im 30/94  soft-tissue]
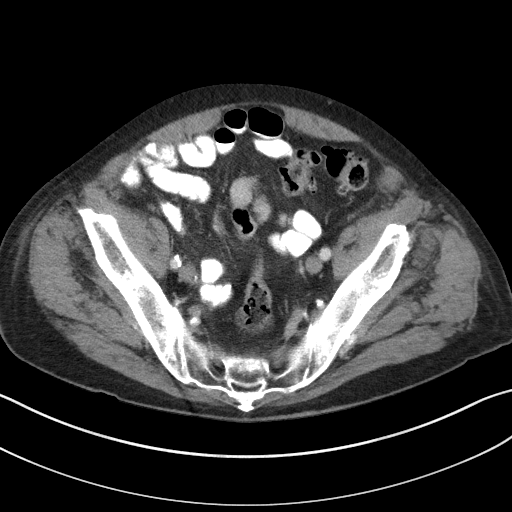
[im 35/94  soft-tissue]
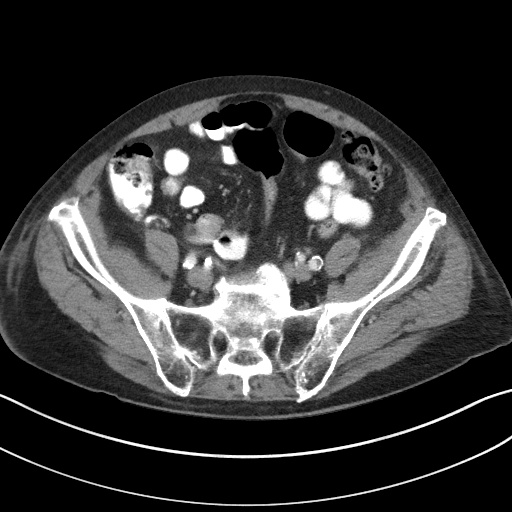
[im 47/94  soft-tissue]
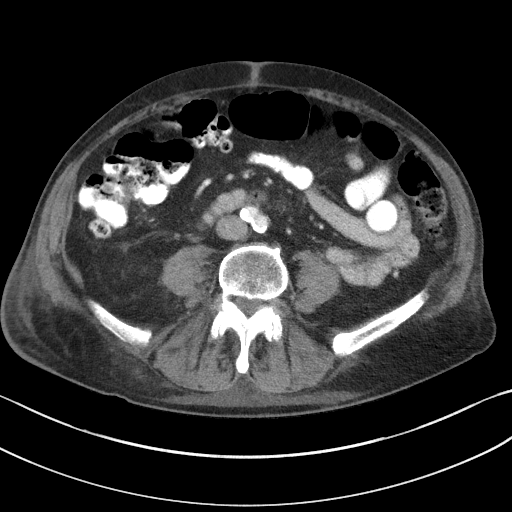
[im 59/94  soft-tissue]
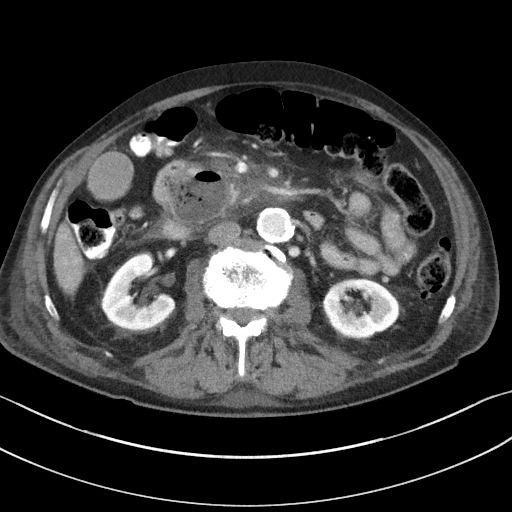
[im 64/94  soft-tissue]
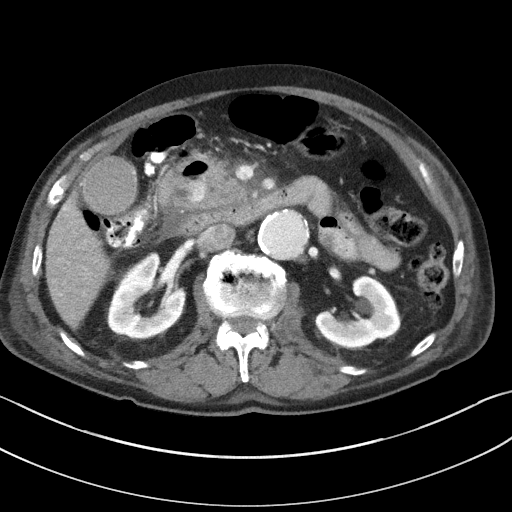
[im 76/94  soft-tissue]
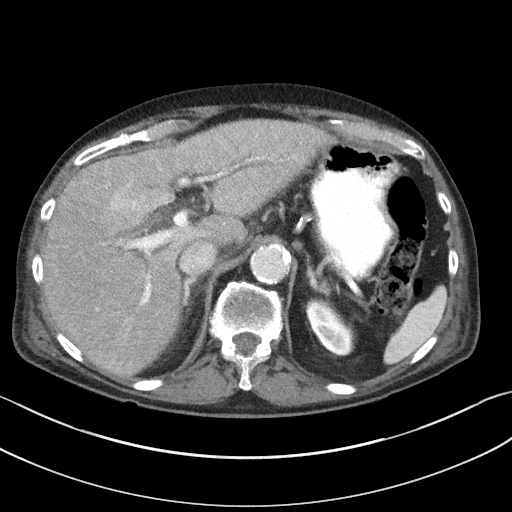
[im 88/94  soft-tissue]
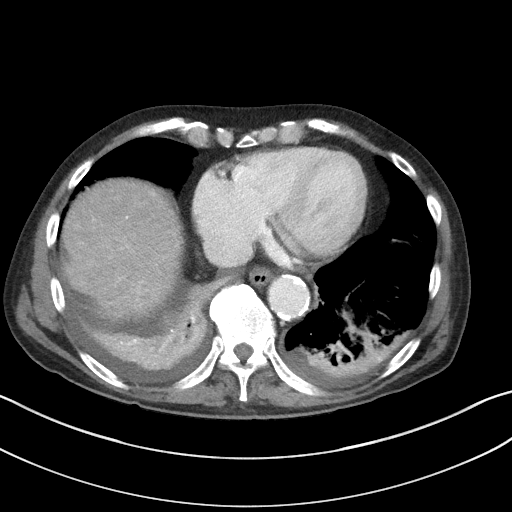
[im 88/94  bone]
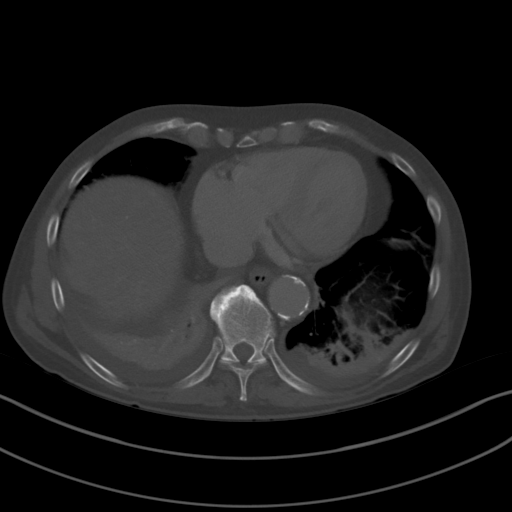

[Series 4: coronal st · coronal · 0.71mm/px · 3 of 84 slices shown]
[im 28/84  soft-tissue]
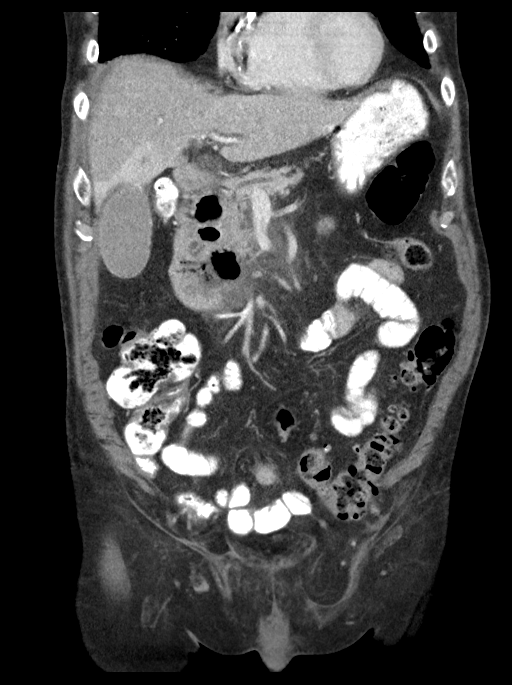
[im 37/84  soft-tissue]
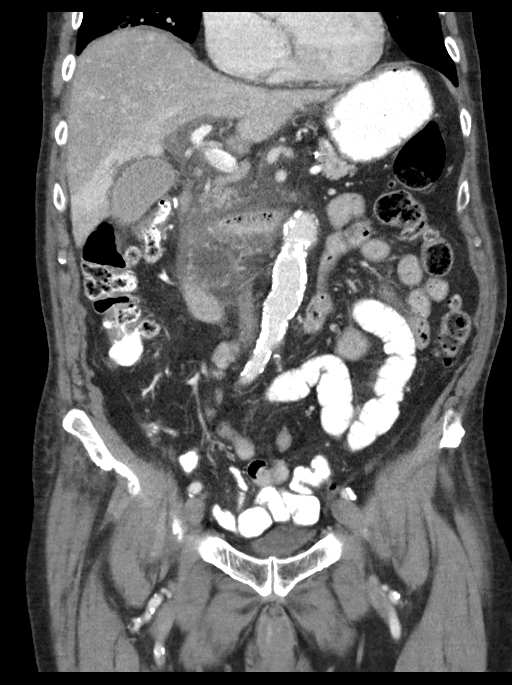
[im 47/84  soft-tissue]
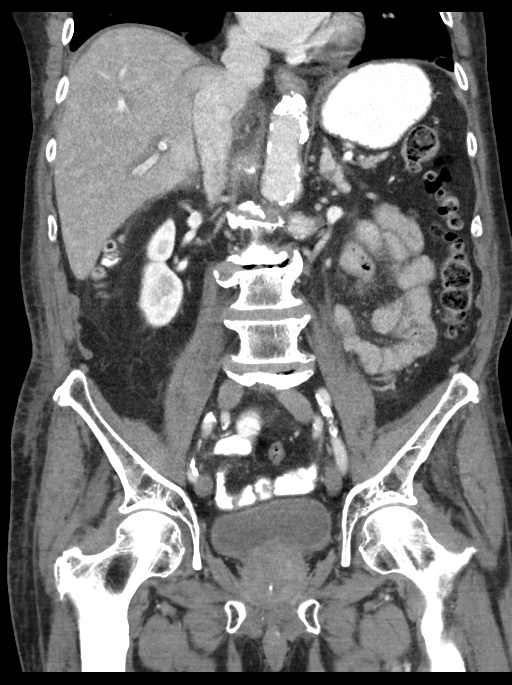

[12 of 46 positions shown; findings below may reference images not displayed]

FINDINGS: Lower chest: Small right pleural effusion and trace left pleural
effusion layering dependently. Areas of dependent subsegmental
atelectasis are noted in the lower lobes of the lungs bilaterally.

Hepatobiliary: Diffuse heterogeneous per fusion throughout the liver
is again noted, and nonspecific. No discrete cystic or solid hepatic
lesions. No intrahepatic biliary ductal dilatation. Common bile duct
measures up to 9 mm in the porta hepatis, which is within normal
limits for the patient's advanced age. Distal common bile duct is
displaced anteriorly secondary to mass effect from the patient's
large duodenal diverticulae. No gallstones are identified.
Gallbladder is unremarkable in appearance. No definite
choledocholithiasis.

Pancreas: There are inflammatory changes adjacent to the inferior
aspect of the pancreatic head and uncinate process, likely reactive
to the adjacent inflammation associated with the duodenal
diverticuli. Remaining portions of the body and tail of the pancreas
are otherwise unremarkable in appearance. No pancreatic mass. No
discrete pancreatic or peripancreatic fluid collections. No
pancreatic ductal dilatation.

Spleen: Unremarkable.

Adrenals/Urinary Tract: 2.2 cm low-attenuation lesion in the upper
pole of the left kidney is compatible with a simple cyst. Right
kidney and bilateral adrenal glands are normal in appearance. No
hydroureteronephrosis. Urinary bladder is normal in appearance.

Stomach/Bowel: Normal appearance of the stomach. There are 2
duodenal diverticulae. The first of these appears to arise from the
duodenal bulb or the very proximal aspect of the second portion of
the duodenum, best appreciated on image 30 of series 2. This
diverticulum is filled with a combination of oral contrast material
and some gas. This extends around the medial margin of the distal
common bile duct, best appreciated on image 32 of series 2. There is
also a very large diverticulum extending off the medial aspect of
the second portion of the duodenum more distally, which is best
appreciated on axial image 37 of series 2 and coronal image 34 of
series 4 where this diverticulum measures up to 4.1 x 5.1 x 4.1 cm.
This diverticulum is filled with a combination of fluid and what
appears to be feculent material. Notably, there is no oral contrast
material within the lumen of this diverticulum, despite abundant
contrast material in the distal small bowel and colon. The feculent
material in this diverticulum appears to be potentially impacted in
the neck of the diverticulum, best appreciated on coronal images 30
and 31 of series 4. This may indicate complete obstruction of the
neck of the diverticulum. This diverticulum is surrounded by
extensive inflammatory changes throughout the adjacent
retroperitoneum, and appears to be the epicenter of the inflammatory
process in this patient. This inflammation extends up into the porta
hepatis and cephalad adjacent to the inferior vena cava. No
pathologic dilatation of more distal aspects of the small bowel or
colon. Numerous colonic diverticulae are noted, without surrounding
inflammatory changes to suggest an acute colonic diverticulitis at
this time. Normal appendix.

Vascular/Lymphatic: Aortic atherosclerosis, with fusiform aneurysmal
dilatation of the infrarenal abdominal aorta which measures up to
3.8 x 3.7 cm (image 32 of series 2). No lymphadenopathy noted in the
abdomen or pelvis.

Reproductive: Prostate gland and seminal vesicles are unremarkable
in appearance.

Other: Small left inguinal hernia containing only fat. No
significant volume of ascites. No pneumoperitoneum.

Musculoskeletal: There are no aggressive appearing lytic or blastic
lesions noted in the visualized portions of the skeleton.
IMPRESSION: 1. Acute duodenal diverticulitis redemonstrated, as above. This
appears centered around the larger of the two duodenal diverticulae
which arises from the medial aspect of the distal second portion of
the duodenum. This larger diverticulum appears potentially
obstructed by some internal feculent material which may be impacted
in the neck of the diverticulum. There are extensive surrounding
inflammatory changes. No oral contrast material is noted outside the
confines of the small bowel in this region (although if the
diverticulum is obstructed, no oral contrast material would be
expected to extravasation).
2. Inflammatory changes are noted adjacent to the inferior aspect of
the head and uncinate process of the pancreas, as well as extending
up into the porta hepatis and cephalad along the course of the
inferior vena cava. Although there may be a mild secondary
pancreatitis, this is likely reactive to the duodenal
diverticulitis.
3. Small right and trace left pleural effusions with dependent areas
of subsegmental atelectasis in the lung bases bilaterally.
4. Aortic atherosclerosis, in addition to right coronary artery
disease. In addition, there is an infrarenal abdominal aortic
aneurysm which measures up to 3.8 x 3.7 cm. Recommend followup by
ultrasound in 2 years. This recommendation follows ACR consensus
guidelines: White Paper of the ACR Incidental Findings Committee II
on Vascular Findings. [HOSPITAL] 2866; [DATE].
5. Colonic diverticulosis without evidence to suggest an acute
diverticulitis at this time.
6. Small left inguinal hernia containing only fat.
7. Additional incidental findings, as above.
These results were discussed by telephone at the time of
interpretation on 06/17/2016 at [DATE] to Dr. HAILYN MELANO, who
verbally acknowledged these results.

## 2017-07-02 DIAGNOSIS — E785 Hyperlipidemia, unspecified: Secondary | ICD-10-CM | POA: Diagnosis not present

## 2017-07-02 DIAGNOSIS — I10 Essential (primary) hypertension: Secondary | ICD-10-CM | POA: Diagnosis not present

## 2017-07-02 DIAGNOSIS — E1129 Type 2 diabetes mellitus with other diabetic kidney complication: Secondary | ICD-10-CM | POA: Diagnosis not present

## 2017-07-17 DIAGNOSIS — E119 Type 2 diabetes mellitus without complications: Secondary | ICD-10-CM | POA: Diagnosis not present

## 2017-09-17 DIAGNOSIS — Z961 Presence of intraocular lens: Secondary | ICD-10-CM | POA: Diagnosis not present

## 2017-10-30 DIAGNOSIS — Z7984 Long term (current) use of oral hypoglycemic drugs: Secondary | ICD-10-CM | POA: Diagnosis not present

## 2017-10-30 DIAGNOSIS — T8529XA Other mechanical complication of intraocular lens, initial encounter: Secondary | ICD-10-CM | POA: Diagnosis not present

## 2017-10-30 DIAGNOSIS — I10 Essential (primary) hypertension: Secondary | ICD-10-CM | POA: Diagnosis not present

## 2017-10-30 DIAGNOSIS — Z87891 Personal history of nicotine dependence: Secondary | ICD-10-CM | POA: Diagnosis not present

## 2017-10-30 DIAGNOSIS — K5900 Constipation, unspecified: Secondary | ICD-10-CM | POA: Diagnosis not present

## 2017-10-30 DIAGNOSIS — I2510 Atherosclerotic heart disease of native coronary artery without angina pectoris: Secondary | ICD-10-CM | POA: Diagnosis not present

## 2017-10-30 DIAGNOSIS — E785 Hyperlipidemia, unspecified: Secondary | ICD-10-CM | POA: Diagnosis not present

## 2017-10-30 DIAGNOSIS — Z7982 Long term (current) use of aspirin: Secondary | ICD-10-CM | POA: Diagnosis not present

## 2017-10-30 DIAGNOSIS — Z79899 Other long term (current) drug therapy: Secondary | ICD-10-CM | POA: Diagnosis not present

## 2017-10-30 DIAGNOSIS — E119 Type 2 diabetes mellitus without complications: Secondary | ICD-10-CM | POA: Diagnosis not present

## 2017-11-01 DIAGNOSIS — E1129 Type 2 diabetes mellitus with other diabetic kidney complication: Secondary | ICD-10-CM | POA: Diagnosis not present

## 2017-11-08 DIAGNOSIS — E1129 Type 2 diabetes mellitus with other diabetic kidney complication: Secondary | ICD-10-CM | POA: Diagnosis not present

## 2017-11-08 DIAGNOSIS — I350 Nonrheumatic aortic (valve) stenosis: Secondary | ICD-10-CM | POA: Diagnosis not present

## 2017-11-08 DIAGNOSIS — R030 Elevated blood-pressure reading, without diagnosis of hypertension: Secondary | ICD-10-CM | POA: Diagnosis not present

## 2017-11-08 DIAGNOSIS — E785 Hyperlipidemia, unspecified: Secondary | ICD-10-CM | POA: Diagnosis not present

## 2017-11-22 DIAGNOSIS — L570 Actinic keratosis: Secondary | ICD-10-CM | POA: Diagnosis not present

## 2017-11-22 DIAGNOSIS — Z1283 Encounter for screening for malignant neoplasm of skin: Secondary | ICD-10-CM | POA: Diagnosis not present

## 2017-11-22 DIAGNOSIS — X32XXXD Exposure to sunlight, subsequent encounter: Secondary | ICD-10-CM | POA: Diagnosis not present

## 2017-11-22 DIAGNOSIS — D0422 Carcinoma in situ of skin of left ear and external auricular canal: Secondary | ICD-10-CM | POA: Diagnosis not present

## 2018-02-01 DIAGNOSIS — E1129 Type 2 diabetes mellitus with other diabetic kidney complication: Secondary | ICD-10-CM | POA: Diagnosis not present

## 2018-02-07 DIAGNOSIS — Z85828 Personal history of other malignant neoplasm of skin: Secondary | ICD-10-CM | POA: Diagnosis not present

## 2018-02-07 DIAGNOSIS — L570 Actinic keratosis: Secondary | ICD-10-CM | POA: Diagnosis not present

## 2018-02-07 DIAGNOSIS — X32XXXD Exposure to sunlight, subsequent encounter: Secondary | ICD-10-CM | POA: Diagnosis not present

## 2018-02-07 DIAGNOSIS — Z08 Encounter for follow-up examination after completed treatment for malignant neoplasm: Secondary | ICD-10-CM | POA: Diagnosis not present

## 2018-03-06 DIAGNOSIS — H35342 Macular cyst, hole, or pseudohole, left eye: Secondary | ICD-10-CM | POA: Diagnosis not present

## 2018-03-06 DIAGNOSIS — H353222 Exudative age-related macular degeneration, left eye, with inactive choroidal neovascularization: Secondary | ICD-10-CM | POA: Diagnosis not present

## 2018-03-06 DIAGNOSIS — H353113 Nonexudative age-related macular degeneration, right eye, advanced atrophic without subfoveal involvement: Secondary | ICD-10-CM | POA: Diagnosis not present

## 2018-03-06 DIAGNOSIS — H43821 Vitreomacular adhesion, right eye: Secondary | ICD-10-CM | POA: Diagnosis not present

## 2018-03-07 DIAGNOSIS — E1129 Type 2 diabetes mellitus with other diabetic kidney complication: Secondary | ICD-10-CM | POA: Diagnosis not present

## 2018-03-14 DIAGNOSIS — I350 Nonrheumatic aortic (valve) stenosis: Secondary | ICD-10-CM | POA: Diagnosis not present

## 2018-03-14 DIAGNOSIS — I10 Essential (primary) hypertension: Secondary | ICD-10-CM | POA: Diagnosis not present

## 2018-03-14 DIAGNOSIS — E1129 Type 2 diabetes mellitus with other diabetic kidney complication: Secondary | ICD-10-CM | POA: Diagnosis not present

## 2018-04-18 DIAGNOSIS — L570 Actinic keratosis: Secondary | ICD-10-CM | POA: Diagnosis not present

## 2018-04-18 DIAGNOSIS — L720 Epidermal cyst: Secondary | ICD-10-CM | POA: Diagnosis not present

## 2018-04-18 DIAGNOSIS — X32XXXD Exposure to sunlight, subsequent encounter: Secondary | ICD-10-CM | POA: Diagnosis not present

## 2018-04-18 DIAGNOSIS — L820 Inflamed seborrheic keratosis: Secondary | ICD-10-CM | POA: Diagnosis not present

## 2018-04-25 DIAGNOSIS — Z23 Encounter for immunization: Secondary | ICD-10-CM | POA: Diagnosis not present

## 2018-06-25 DIAGNOSIS — E1129 Type 2 diabetes mellitus with other diabetic kidney complication: Secondary | ICD-10-CM | POA: Diagnosis not present

## 2018-06-25 DIAGNOSIS — I10 Essential (primary) hypertension: Secondary | ICD-10-CM | POA: Diagnosis not present

## 2018-06-25 DIAGNOSIS — I350 Nonrheumatic aortic (valve) stenosis: Secondary | ICD-10-CM | POA: Diagnosis not present

## 2018-06-25 DIAGNOSIS — R413 Other amnesia: Secondary | ICD-10-CM | POA: Diagnosis not present

## 2018-07-02 DIAGNOSIS — I10 Essential (primary) hypertension: Secondary | ICD-10-CM | POA: Diagnosis not present

## 2018-07-02 DIAGNOSIS — E785 Hyperlipidemia, unspecified: Secondary | ICD-10-CM | POA: Diagnosis not present

## 2018-07-02 DIAGNOSIS — I350 Nonrheumatic aortic (valve) stenosis: Secondary | ICD-10-CM | POA: Diagnosis not present

## 2018-07-02 DIAGNOSIS — E1129 Type 2 diabetes mellitus with other diabetic kidney complication: Secondary | ICD-10-CM | POA: Diagnosis not present

## 2018-07-04 ENCOUNTER — Other Ambulatory Visit (HOSPITAL_COMMUNITY): Payer: Self-pay | Admitting: Internal Medicine

## 2018-07-04 DIAGNOSIS — I350 Nonrheumatic aortic (valve) stenosis: Secondary | ICD-10-CM

## 2018-07-22 ENCOUNTER — Ambulatory Visit (HOSPITAL_COMMUNITY)
Admission: RE | Admit: 2018-07-22 | Discharge: 2018-07-22 | Disposition: A | Discharge status: Home / Self Care | Payer: Medicare Other | Source: Ambulatory Visit | Attending: Internal Medicine | Admitting: Internal Medicine

## 2018-07-22 DIAGNOSIS — I350 Nonrheumatic aortic (valve) stenosis: Secondary | ICD-10-CM | POA: Diagnosis present

## 2018-07-22 NOTE — Progress Notes (Signed)
*  PRELIMINARY RESULTS* Echocardiogram 2D Echocardiogram has been performed.  Jordan [name] 07/22/2018, 3:20 PM

## 2018-07-25 DIAGNOSIS — Z0120 Encounter for dental examination and cleaning without abnormal findings: Secondary | ICD-10-CM | POA: Diagnosis not present

## 2018-08-25 DIAGNOSIS — E1129 Type 2 diabetes mellitus with other diabetic kidney complication: Secondary | ICD-10-CM | POA: Diagnosis not present

## 2019-01-07 DIAGNOSIS — E1129 Type 2 diabetes mellitus with other diabetic kidney complication: Secondary | ICD-10-CM | POA: Diagnosis not present

## 2019-01-13 DIAGNOSIS — E1129 Type 2 diabetes mellitus with other diabetic kidney complication: Secondary | ICD-10-CM | POA: Diagnosis not present

## 2019-01-13 DIAGNOSIS — M1991 Primary osteoarthritis, unspecified site: Secondary | ICD-10-CM | POA: Diagnosis not present

## 2019-01-13 DIAGNOSIS — I350 Nonrheumatic aortic (valve) stenosis: Secondary | ICD-10-CM | POA: Diagnosis not present

## 2019-01-13 DIAGNOSIS — Z0120 Encounter for dental examination and cleaning without abnormal findings: Secondary | ICD-10-CM | POA: Diagnosis not present

## 2019-02-07 ENCOUNTER — Ambulatory Visit: Payer: Medicare Other | Admitting: Urology

## 2019-02-12 ENCOUNTER — Ambulatory Visit (INDEPENDENT_AMBULATORY_CARE_PROVIDER_SITE_OTHER): Payer: Medicare Other | Admitting: Urology

## 2019-02-12 DIAGNOSIS — N401 Enlarged prostate with lower urinary tract symptoms: Secondary | ICD-10-CM

## 2019-02-12 DIAGNOSIS — R351 Nocturia: Secondary | ICD-10-CM

## 2019-03-26 ENCOUNTER — Ambulatory Visit (INDEPENDENT_AMBULATORY_CARE_PROVIDER_SITE_OTHER): Payer: Medicare Other | Admitting: Urology

## 2019-03-26 DIAGNOSIS — N401 Enlarged prostate with lower urinary tract symptoms: Secondary | ICD-10-CM

## 2019-03-26 DIAGNOSIS — R351 Nocturia: Secondary | ICD-10-CM | POA: Diagnosis not present

## 2019-04-21 DIAGNOSIS — Z23 Encounter for immunization: Secondary | ICD-10-CM | POA: Diagnosis not present

## 2019-05-19 DIAGNOSIS — Z79899 Other long term (current) drug therapy: Secondary | ICD-10-CM | POA: Diagnosis not present

## 2019-05-19 DIAGNOSIS — E119 Type 2 diabetes mellitus without complications: Secondary | ICD-10-CM | POA: Diagnosis not present

## 2019-05-28 DIAGNOSIS — R413 Other amnesia: Secondary | ICD-10-CM | POA: Diagnosis not present

## 2019-05-28 DIAGNOSIS — N401 Enlarged prostate with lower urinary tract symptoms: Secondary | ICD-10-CM | POA: Diagnosis not present

## 2019-05-28 DIAGNOSIS — E1129 Type 2 diabetes mellitus with other diabetic kidney complication: Secondary | ICD-10-CM | POA: Diagnosis not present

## 2019-06-18 ENCOUNTER — Other Ambulatory Visit: Payer: Self-pay

## 2019-06-18 DIAGNOSIS — Z20822 Contact with and (suspected) exposure to covid-19: Secondary | ICD-10-CM

## 2019-06-20 LAB — NOVEL CORONAVIRUS, NAA: SARS-CoV-2, NAA: NOT DETECTED

## 2019-06-23 ENCOUNTER — Telehealth: Payer: Self-pay | Admitting: Internal Medicine

## 2019-06-23 NOTE — Telephone Encounter (Signed)
Negative COVID results given. Patient results "NOT Detected." Caller expressed understanding. ° °

## 2019-07-02 ENCOUNTER — Ambulatory Visit: Payer: Medicare Other | Admitting: Urology

## 2019-08-14 DIAGNOSIS — R413 Other amnesia: Secondary | ICD-10-CM | POA: Diagnosis not present

## 2019-08-14 DIAGNOSIS — T7611XA Adult physical abuse, suspected, initial encounter: Secondary | ICD-10-CM | POA: Diagnosis not present

## 2019-08-20 ENCOUNTER — Encounter: Payer: Self-pay | Admitting: Urology

## 2019-08-20 ENCOUNTER — Ambulatory Visit (INDEPENDENT_AMBULATORY_CARE_PROVIDER_SITE_OTHER): Payer: Medicare Other | Admitting: Urology

## 2019-08-20 ENCOUNTER — Other Ambulatory Visit: Payer: Self-pay

## 2019-08-20 VITALS — BP 131/66 | HR 81 | Temp 97.5°F | Ht 68.0 in | Wt 148.0 lb

## 2019-08-20 DIAGNOSIS — N401 Enlarged prostate with lower urinary tract symptoms: Secondary | ICD-10-CM

## 2019-08-20 DIAGNOSIS — R351 Nocturia: Secondary | ICD-10-CM | POA: Diagnosis not present

## 2019-08-20 DIAGNOSIS — N138 Other obstructive and reflux uropathy: Secondary | ICD-10-CM

## 2019-08-20 MED ORDER — TAMSULOSIN HCL 0.4 MG PO CAPS: 0.4000 mg | ORAL_CAPSULE | Freq: Two times a day (BID) | ORAL | 3 refills | Status: DC

## 2019-08-20 MED ORDER — TROSPIUM CHLORIDE ER 60 MG PO CP24: 1.0000 | ORAL_CAPSULE | Freq: Every day | ORAL | 11 refills | Status: DC

## 2019-08-20 MED ORDER — FINASTERIDE 5 MG PO TABS: 5.0000 mg | ORAL_TABLET | Freq: Every day | ORAL | 3 refills | Status: DC

## 2019-08-20 NOTE — Progress Notes (Signed)
Urological Symptom Review  Patient is experiencing the following symptoms: Frequent urination Hard to postpone urination Get up at night to urinate Leakage of urine Stream starts and stops Trouble starting stream Have to strain to urinate Weak stream   Review of Systems  Gastrointestinal (upper)  : Negative for upper GI symptoms  Gastrointestinal (lower) : Negative for lower GI symptoms  Constitutional : Negative for symptoms  Skin: Negative for skin symptoms  Eyes: Negative for eye symptoms  Ear/Nose/Throat : Negative for Ear/Nose/Throat symptoms  Hematologic/Lymphatic: Negative for Hematologic/Lymphatic symptoms  Cardiovascular : Negative for cardiovascular symptoms  Respiratory : Negative for respiratory symptoms  Endocrine: Negative for endocrine symptoms  Musculoskeletal: Negative for musculoskeletal symptoms  Neurological: Negative for neurological symptoms  Psychologic: Negative for psychiatric symptoms

## 2019-08-20 NOTE — Progress Notes (Signed)
08/20/2019 3:01 PM   Jordan [name] [birth year] [id number]  Referring provider: Asencion Noble, MD [address] [place],  [place] [address]  Nocturia  HPI: [name] is a 86yo here for followup for BPH and nocturia. He is currently on flomax BID and finasteride. He has nocturia 4x per night. He has urinary urgency and frequency which is stable. He previously tried mirabegron which failed to improve his LUTS.     PMH: Past Medical History:  Diagnosis Date  . Arthritis   . BPH (benign prostatic hypertrophy)   . CAD (coronary artery disease)    DES to RCA 2009  . Diverticulosis of colon   . Essential hypertension   . Fecal urgency   . History of adenomatous polyp of colon    Tubular adenomas 2016  . Hyperlipidemia   . Lower urinary tract symptoms (LUTS)   . Type 2 diabetes mellitus ([place])   . Wears glasses     Surgical History: Past Surgical History:  Procedure Laterality Date  . CATARACT EXTRACTION W/ INTRAOCULAR LENS  IMPLANT, BILATERAL    . COLONOSCOPY  last one 11/16/2014   polpectomy  . CORONARY ANGIOPLASTY WITH STENT PLACEMENT  12-23-2007  dr hochrein   (abnormal myoview)  PCI w/ DES x1 pRCA (95%),  pCFX 25%,  mCFX 40-50%,  after PDA 30%,  prox, mid, and ostial LAD  30% calcification,  LM diffuse 25% calcification,  ef 60%  . EVALUATION UNDER ANESTHESIA WITH ANAL FISTULECTOMY [name] 06/03/2015   Procedure: ANAL EXAM UNDER ANESTHESIA ,FISTULOTOMY ;  Surgeon: [name], MD;  Location: [facility];  Service: General;  Laterality: [name];  . INGUINAL HERNIA REPAIR Right 2001  . TRANSTHORACIC ECHOCARDIOGRAM  12-06-2007   normal LVF, ef 60%/  mild to moderate  AV calcification without stenosis,  mild AR and TR,  mild LAE    Home Medications:  Allergies as of 08/20/2019   No Known Allergies     Medication List       Accurate as of August 20, 2019  3:01 PM. If you have any questions, ask your nurse or doctor.        Accu-Chek FastClix  Lancets Misc USE 1 TO CHECK GLUCOSE ONCE DAILY   Accu-Chek Guide test strip Generic drug: glucose blood USE 1 STRIP TO CHECK GLUCOSE ONCE DAILY   aspirin 81 MG tablet Take 81 mg by mouth every evening.   BENEFIBER PO Take by mouth daily.   donepezil 10 MG tablet Commonly known as: ARICEPT Take 10 mg by mouth at bedtime.   enalapril 10 MG tablet Commonly known as: VASOTEC Take 10 mg by mouth daily.   finasteride 5 MG tablet Commonly known as: PROSCAR Take 5 mg by mouth daily.   hydrochlorothiazide 12.5 MG capsule Commonly known as: MICROZIDE Take 12.5 mg by mouth daily.   memantine 5 MG tablet Commonly known as: NAMENDA Take 5 mg by mouth 2 (two) times daily.   metFORMIN 500 MG tablet Commonly known as: GLUCOPHAGE Take 500 mg by mouth 2 (two) times daily with a meal.   polyethylene glycol powder 17 GM/SCOOP powder Commonly known as: GLYCOLAX/MIRALAX Take 17 g by mouth daily. For a bowel movement What changed: when to take this   pravastatin 80 MG tablet Commonly known as: PRAVACHOL Take 80 mg by mouth daily.   tamsulosin 0.4 MG Caps capsule Commonly known as: FLOMAX Take 0.4 mg by mouth daily.       Allergies: No Known  Allergies  Family History: Family History  Problem Relation Age of Onset  . Heart disease Brother   . Colon cancer Neg Hx     Social History:  reports that he quit smoking about 36 years ago. His smoking use included cigarettes. He quit after 30.00 years of use. He has never used smokeless tobacco. He reports that he does not drink alcohol or use drugs.  ROS: All other review of systems were reviewed and are negative except what is noted above in HPI  Physical Exam: BP 131/66   Pulse 81   Temp (!) 97.5 F (36.4 C)   Ht 5\' 8"  (1.727 m)   Wt 148 lb (67.1 kg)   BMI 22.50 kg/m   Constitutional:  Alert and oriented, No acute distress. HEENT: [place] AT, moist mucus membranes.  Trachea midline, no masses. Cardiovascular: No clubbing,  cyanosis, or edema. Respiratory: Normal respiratory effort, no increased work of breathing. GI: Abdomen is soft, nontender, nondistended, no abdominal masses GU: No CVA tenderness Lymph: No cervical or inguinal lymphadenopathy. Skin: No rashes, bruises or suspicious lesions. Neurologic: Grossly intact, no focal deficits, moving all 4 extremities. Psychiatric: Normal mood and affect.  Laboratory Data: Lab Results  Component Value Date   WBC 13.6 (H) 06/19/2016   HGB 11.0 (L) 06/19/2016   HCT 35.2 (L) 06/19/2016   MCV 90.0 06/19/2016   PLT 239 06/19/2016    Lab Results  Component Value Date   CREATININE 0.75 06/20/2016    No results found for: PSA  No results found for: TESTOSTERONE  No results found for: HGBA1C  Urinalysis    Component Value Date/Time   COLORURINE YELLOW 06/13/2016 2050   APPEARANCEUR CLEAR 06/13/2016 2050   [place] 1.020 06/13/2016 2050   PHURINE 5.5 06/13/2016 2050   GLUCOSEU NEGATIVE 06/13/2016 2050   [place] NEGATIVE 06/13/2016 2050   [place] NEGATIVE 06/13/2016 2050   KETONESUR 15 (A) 06/13/2016 2050   PROTEINUR NEGATIVE 06/13/2016 2050   NITRITE NEGATIVE 06/13/2016 2050   LEUKOCYTESUR NEGATIVE 06/13/2016 2050    No results found for: LABMICR, [place], RBCUA, LABEPIT, MUCUS, BACTERIA  Pertinent Imaging:  No results found for this or any previous visit. No results found for this or any previous visit. No results found for this or any previous visit. No results found for this or any previous visit. No results found for this or any previous visit. No results found for this or any previous visit. No results found for this or any previous visit. No results found for this or any previous visit.  Assessment & Plan:    1. Benign prostatic hyperplasia with urinary obstruction -continue flomax 0.4mg  BID -continue finasteride  2. Nocturia -trospium 60mg     No follow-ups on file.  Nicolette Bang, MD  [facility] [place]

## 2019-08-20 NOTE — Patient Instructions (Signed)

## 2019-08-22 ENCOUNTER — Other Ambulatory Visit: Payer: Self-pay

## 2019-08-22 DIAGNOSIS — N138 Other obstructive and reflux uropathy: Secondary | ICD-10-CM

## 2019-08-22 DIAGNOSIS — N401 Enlarged prostate with lower urinary tract symptoms: Secondary | ICD-10-CM

## 2019-08-22 MED ORDER — MIRABEGRON ER 25 MG PO TB24: 25.0000 mg | ORAL_TABLET | Freq: Every day | ORAL | 11 refills | Status: DC

## 2019-08-25 DIAGNOSIS — R197 Diarrhea, unspecified: Secondary | ICD-10-CM | POA: Diagnosis not present

## 2019-09-08 ENCOUNTER — Ambulatory Visit (INDEPENDENT_AMBULATORY_CARE_PROVIDER_SITE_OTHER): Payer: Medicare Other | Admitting: Gastroenterology

## 2019-09-08 ENCOUNTER — Encounter (INDEPENDENT_AMBULATORY_CARE_PROVIDER_SITE_OTHER): Payer: Self-pay | Admitting: Gastroenterology

## 2019-09-08 ENCOUNTER — Other Ambulatory Visit: Payer: Self-pay

## 2019-09-08 VITALS — BP 129/66 | HR 69 | Temp 97.2°F | Ht 67.0 in | Wt 140.6 lb

## 2019-09-08 DIAGNOSIS — R634 Abnormal weight loss: Secondary | ICD-10-CM | POA: Diagnosis not present

## 2019-09-08 DIAGNOSIS — R197 Diarrhea, unspecified: Secondary | ICD-10-CM | POA: Diagnosis not present

## 2019-09-08 NOTE — Patient Instructions (Signed)
We are checking labs for further evaluation - in meantime you can try imodium daily for symptom control. Try to decrease coffee intake.

## 2019-09-08 NOTE — Progress Notes (Signed)
Patient profile: Jordan [name] is a 86 y.o. male seen for evaluation of diarrhea. Last seen by [facility] for hemorrhoids, fecal incontinence in 2016   Referred today for diarrhea by PCP   History of Present Illness: Jordan [name] is seen today for diarrhea, he reports this began 19 days ago, prior to diarrhea his bowel movements were well controlled on a fiber powder with 2 teaspoon daily and a fiber tablet at night.  With that regimen he had 1-2 bowel movements.  19 days ago he began having 4 loose stools a day he denies any abdominal pain with the change in bowel habits.  He denies starting any new medications when change in bowel habits began.  His daughter was diagnosed with Covid a few days after his diarrhea began but he has not had any respiratory symptoms and is sure the diarrhea began before daughter diagnosed with Covid.  No recent antibiotics.  He has no abdominal pain or rectal bleeding with the symptoms.  He has tried Imodium which helps some.  Reports he has been on Metformin for years and years.  Drinks 2 to 3 cups of coffee per day.  Limited dairy.  He tried to decrease his fruit intake on his own to see if this helped and did not.  Very rare nocturnal diarrhea, usually occurs during day.  No abdominal pain or rectal bleeding with the diarrhea.  He has no upper GI symptoms-denies any GERD, nausea, vomiting, epigastric pain.  He has unintentionally lost 8 pounds since his symptoms began but denies any pain with eating.  Does have some urgency after meals  Wt Readings from Last 3 Encounters:  09/08/19 140 lb 9.6 oz (63.8 kg)  08/20/19 148 lb (67.1 kg)  04/16/17 149 lb (67.6 kg)     Last Colonoscopy: 2016-Colonoscopy, 2016-rectal vault with normal CO2 well, 8 mm flat polyp in cecum, 1.5 cm flat sausage shaped polyp distal to appendiceal orifice, 3 mm polyp as well. Path w/ tubular adenoma    Last Endoscopy:    Past Medical History:  Past Medical History:   Diagnosis Date  . Arthritis   . BPH (benign prostatic hypertrophy)   . CAD (coronary artery disease)    DES to RCA 2009  . Diverticulosis of colon   . Essential hypertension   . Fecal urgency   . History of adenomatous polyp of colon    Tubular adenomas 2016  . Hyperlipidemia   . Lower urinary tract symptoms (LUTS)   . Type 2 diabetes mellitus ([place])   . Wears glasses     Problem List: Patient Active Problem List   Diagnosis Date Noted  . Benign prostatic hyperplasia with urinary obstruction 08/20/2019  . Nocturia 08/20/2019  . Pancreatitis 06/13/2016  . Type 2 diabetes mellitus with complication ([place])   . S/P drug eluting coronary stent placement   . Hypertension   . Hyperlipidemia   . History of colonic polyps   . Diverticulosis of colon without hemorrhage   . Hemorrhoids 10/23/2014  . Constipation 06/03/2012  . Balance disorder 05/29/2012    Past Surgical History: Past Surgical History:  Procedure Laterality Date  . CATARACT EXTRACTION W/ INTRAOCULAR LENS  IMPLANT, BILATERAL    . COLONOSCOPY  last one 11/16/2014   polpectomy  . CORONARY ANGIOPLASTY WITH STENT PLACEMENT  12-23-2007  dr hochrein   (abnormal myoview)  PCI w/ DES x1 pRCA (95%),  pCFX 25%,  mCFX 40-50%,  after PDA 30%,  prox, mid,  and ostial LAD  30% calcification,  LM diffuse 25% calcification,  ef 60%  . EVALUATION UNDER ANESTHESIA WITH ANAL FISTULECTOMY [name] 06/03/2015   Procedure: ANAL EXAM UNDER ANESTHESIA ,FISTULOTOMY ;  Surgeon: [name], MD;  Location: [facility];  Service: General;  Laterality: [name];  . INGUINAL HERNIA REPAIR Right 2001  . TRANSTHORACIC ECHOCARDIOGRAM  12-06-2007   normal LVF, ef 60%/  mild to moderate  AV calcification without stenosis,  mild AR and TR,  mild LAE    Allergies: No Known Allergies    Home Medications:  Current Outpatient Medications:  .  Accu-Chek FastClix Lancets MISC, USE 1 TO CHECK GLUCOSE ONCE DAILY, Disp: , Rfl:  .  ACCU-CHEK GUIDE  test strip, USE 1 STRIP TO CHECK GLUCOSE ONCE DAILY, Disp: , Rfl:  .  aspirin 81 MG tablet, Take 81 mg by mouth every evening. , Disp: , Rfl:  .  donepezil (ARICEPT) 10 MG tablet, Take 10 mg by mouth at bedtime. , Disp: , Rfl:  .  enalapril (VASOTEC) 10 MG tablet, Take 10 mg by mouth daily., Disp: , Rfl:  .  finasteride (PROSCAR) 5 MG tablet, Take 1 tablet (5 mg total) by mouth daily., Disp: 90 tablet, Rfl: 3 .  hydrochlorothiazide (MICROZIDE) 12.5 MG capsule, Take 12.5 mg by mouth daily., Disp: , Rfl:  .  memantine (NAMENDA) 5 MG tablet, Take 5 mg by mouth 2 (two) times daily. , Disp: , Rfl: 11 .  metFORMIN (GLUCOPHAGE) 500 MG tablet, Take 500 mg by mouth 2 (two) times daily with a meal., Disp: , Rfl:  .  mirabegron ER (MYRBETRIQ) 25 MG TB24 tablet, Take 1 tablet (25 mg total) by mouth daily., Disp: 30 tablet, Rfl: 11 .  pravastatin (PRAVACHOL) 80 MG tablet, Take 80 mg by mouth daily., Disp: , Rfl:  .  tamsulosin (FLOMAX) 0.4 MG CAPS capsule, Take 1 capsule (0.4 mg total) by mouth 2 (two) times daily., Disp: 180 capsule, Rfl: 3 .  Wheat Dextrin (BENEFIBER PO), Take by mouth daily., Disp: , Rfl:  .  polyethylene glycol powder (GLYCOLAX/MIRALAX) powder, Take 17 g by mouth daily. For a bowel movement (Patient not taking: Reported on 09/08/2019), Disp: 255 g, Rfl: 3   Family History: family history includes Heart disease in his brother.    Social History:   reports that he quit smoking about 36 years ago. His smoking use included cigarettes. He quit after 30.00 years of use. He has never used smokeless tobacco. He reports that he does not drink alcohol or use drugs.   Review of Systems: Constitutional: Denies weight loss/weight gain  Eyes: No changes in vision. ENT: No oral lesions, sore throat.  GI: see HPI.  Heme/Lymph: No easy bruising.  CV: No chest pain.  GU: No hematuria.  Integumentary: No rashes.  Neuro: No headaches.  Psych: No depression/anxiety.  Endocrine: No heat/cold  intolerance.  Allergic/Immunologic: No urticaria.  Resp: No cough, SOB.  Musculoskeletal: No joint swelling.    Physical Examination: BP 129/66 (BP Location: Right Arm, Patient Position: Sitting, Cuff Size: Large)   Pulse 69   Temp (!) 97.2 F (36.2 C) (Temporal)   Ht '5\' 7"'  (1.702 m)   Wt 140 lb 9.6 oz (63.8 kg)   BMI 22.02 kg/m  Gen: NAD, alert and oriented x 4 HEENT: [place], EOMI, Neck: supple, no JVD Chest: CTA bilaterally, no wheezes, crackles, or other adventitious sounds CV: RRR, no m/g/c/r Abd: soft, NT, ND, +BS in all four quadrants;  no HSM, guarding, ridigity, or rebound tenderness Ext: no edema, well perfused with 2+ pulses, Skin: no rash or lesions noted on observed skin Lymph: no noted LAD  Data:  Negative GI PCR panel August 29, 2019 including negative C diff testing   Assessment/Plan: Mr. [name] is a 86 y.o. male  Jordan was seen today for new patient (initial visit).  Diagnoses and all orders for this visit:  Diarrhea, unspecified type -     CBC with Differential -     TSH -     COMPLETE METABOLIC PANEL WITH GFR -     Pancreatic elastase, fecal -     Sed Rate (ESR) -     C-reactive protein  Loss of weight -     CBC with Differential -     TSH -     COMPLETE METABOLIC PANEL WITH GFR -     Pancreatic elastase, fecal -     Sed Rate (ESR) -     C-reactive protein     1.  Diarrhea and loss of weight-he had infectious testing at his PCP when symptoms began with a negative GI PCR and C. difficile panel.  Acute onset of symptoms still suspicious for infectious diarrhea and would consider empiric antibiotics depending on lab results as above.  We will check a fecal elastase as well.  He has used Imodium over-the-counter with some benefit and reviewed can use this as needed.  Also recommend he decrease his coffee.  May ultimately need colonoscopy but would prefer to avoid at his advanced age unless indicated, also consider abdominal imaging.  Further  recommendations pending his lab result review. Dr Laural Golden to also provide additional recomendations   I personally performed the service, non-incident to. (WP)  Laurine Blazer, [facility]

## 2019-09-09 DIAGNOSIS — R197 Diarrhea, unspecified: Secondary | ICD-10-CM | POA: Diagnosis not present

## 2019-09-09 DIAGNOSIS — R634 Abnormal weight loss: Secondary | ICD-10-CM | POA: Diagnosis not present

## 2019-09-09 LAB — C-REACTIVE PROTEIN: CRP: 0.6 mg/L (ref <8.0)

## 2019-09-09 LAB — COMPLETE METABOLIC PANEL WITH GFR
AG Ratio: 1.6 (calc) (ref 1.0–2.5)
ALT: 13 U/L (ref 9–46)
AST: 19 U/L (ref 10–35)
Albumin: 3.6 g/dL (ref 3.6–5.1)
Alkaline phosphatase (APISO): 81 U/L (ref 35–144)
BUN: 23 mg/dL (ref 7–25)
CO2: 31 mmol/L (ref 20–32)
Calcium: 8.8 mg/dL (ref 8.6–10.3)
Chloride: 100 mmol/L (ref 98–110)
Creat: 1.09 mg/dL (ref 0.70–1.11)
GFR, Est African American: 67 mL/min/{1.73_m2} (ref >=60)
GFR, Est Non African American: 57 mL/min/{1.73_m2} — ABNORMAL LOW (ref >=60)
Globulin: 2.3 g/dL (calc) (ref 1.9–3.7)
Glucose, Bld: 120 mg/dL (ref 65–139)
Potassium: 4.7 mmol/L (ref 3.5–5.3)
Sodium: 138 mmol/L (ref 135–146)
Total Bilirubin: 0.4 mg/dL (ref 0.2–1.2)
Total Protein: 5.9 g/dL — ABNORMAL LOW (ref 6.1–8.1)

## 2019-09-09 LAB — CBC WITH DIFFERENTIAL/PLATELET
Absolute Monocytes: 555 cells/uL (ref 200–950)
Basophils Absolute: 38 cells/uL (ref 0–200)
Basophils Relative: 0.5 %
Eosinophils Absolute: 99 cells/uL (ref 15–500)
Eosinophils Relative: 1.3 %
HCT: 35.7 % — ABNORMAL LOW (ref 38.5–50.0)
Hemoglobin: 11.9 g/dL — ABNORMAL LOW (ref 13.2–17.1)
Lymphs Abs: 1102 cells/uL (ref 850–3900)
MCH: 29.8 pg (ref 27.0–33.0)
MCHC: 33.3 g/dL (ref 32.0–36.0)
MCV: 89.5 fL (ref 80.0–100.0)
MPV: 10.1 fL (ref 7.5–12.5)
Monocytes Relative: 7.3 %
Neutro Abs: 5806 cells/uL (ref 1500–7800)
Neutrophils Relative %: 76.4 %
Platelets: 363 10*3/uL (ref 140–400)
RBC: 3.99 10*6/uL — ABNORMAL LOW (ref 4.20–5.80)
RDW: 11.8 % (ref 11.0–15.0)
Total Lymphocyte: 14.5 %
WBC: 7.6 10*3/uL (ref 3.8–10.8)

## 2019-09-09 LAB — TSH: TSH: 2.75 mIU/L (ref 0.40–4.50)

## 2019-09-09 LAB — SEDIMENTATION RATE: Sed Rate: 19 mm/h (ref 0–20)

## 2019-09-11 ENCOUNTER — Ambulatory Visit (HOSPITAL_COMMUNITY)
Admission: RE | Admit: 2019-09-11 | Discharge: 2019-09-11 | Disposition: A | Discharge status: Home / Self Care | Payer: Medicare Other | Source: Ambulatory Visit | Attending: Internal Medicine | Admitting: Internal Medicine

## 2019-09-11 ENCOUNTER — Other Ambulatory Visit (HOSPITAL_COMMUNITY): Payer: Self-pay | Admitting: Internal Medicine

## 2019-09-11 ENCOUNTER — Other Ambulatory Visit: Payer: Self-pay

## 2019-09-11 DIAGNOSIS — M25511 Pain in right shoulder: Secondary | ICD-10-CM | POA: Diagnosis not present

## 2019-09-15 ENCOUNTER — Telehealth (INDEPENDENT_AMBULATORY_CARE_PROVIDER_SITE_OTHER): Payer: Self-pay | Admitting: Gastroenterology

## 2019-09-15 NOTE — Telephone Encounter (Signed)
Patient called regarding test results from stool sample - ph# [phone]

## 2019-09-16 ENCOUNTER — Encounter (INDEPENDENT_AMBULATORY_CARE_PROVIDER_SITE_OTHER): Payer: Self-pay

## 2019-09-16 LAB — PANCREATIC ELASTASE, FECAL: Pancreatic Elastase-1, Stool: 218 mcg/g

## 2019-09-17 NOTE — Telephone Encounter (Signed)
Patient has been called and given result of his stool test.

## 2019-09-19 ENCOUNTER — Other Ambulatory Visit: Payer: Self-pay

## 2019-09-19 ENCOUNTER — Encounter: Payer: Self-pay | Admitting: Orthopedic Surgery

## 2019-09-19 ENCOUNTER — Ambulatory Visit: Payer: Medicare Other | Admitting: Orthopedic Surgery

## 2019-09-19 VITALS — BP 136/56 | HR 68 | Temp 97.4°F | Ht 67.0 in | Wt 135.0 lb

## 2019-09-19 DIAGNOSIS — M25511 Pain in right shoulder: Secondary | ICD-10-CM

## 2019-09-19 NOTE — Progress Notes (Signed)
Jordan [name]  09/19/2019  Body mass index is 21.14 kg/m.   HISTORY SECTION :  Chief Complaint  Patient presents with  . Shoulder Injury    Right shoulder pain, fall 3 week sago, referred by Dr. Willey Blade.   HPI The patient presents for evaluation of  (mild/moderate/severe/ ) mild pain, in the (right /left) right shoulder, for 2 to 3 weeks, associated with lifting a heavy object.  Prior treatment none  Consult requested by Dr.Fagan.  Patient fell thin thought to have small bone avulsion near the supraspinatus insertion was concern for partial cuff tear.  He then picked up a heavy bag of 20 pounds and thought he should get his shoulder checked out since it hurt     Review of Systems  Constitutional: Negative for chills and fever.  HENT: Positive for hearing loss.   Neurological: Negative for tingling.     has a past medical history of Arthritis, BPH (benign prostatic hypertrophy), CAD (coronary artery disease), Diverticulosis of colon, Essential hypertension, Fecal urgency, History of adenomatous polyp of colon, Hyperlipidemia, Lower urinary tract symptoms (LUTS), Type 2 diabetes mellitus ([place]), and Wears glasses.   Past Surgical History:  Procedure Laterality Date  . CATARACT EXTRACTION W/ INTRAOCULAR LENS  IMPLANT, BILATERAL    . COLONOSCOPY  last one 11/16/2014   polpectomy  . CORONARY ANGIOPLASTY WITH STENT PLACEMENT  12-23-2007  dr hochrein   (abnormal myoview)  PCI w/ DES x1 pRCA (95%),  pCFX 25%,  mCFX 40-50%,  after PDA 30%,  prox, mid, and ostial LAD  30% calcification,  LM diffuse 25% calcification,  ef 60%  . EVALUATION UNDER ANESTHESIA WITH ANAL FISTULECTOMY [name] 06/03/2015   Procedure: ANAL EXAM UNDER ANESTHESIA ,FISTULOTOMY ;  Surgeon: [name], MD;  Location: [facility];  Service: General;  Laterality: [name];  . INGUINAL HERNIA REPAIR Right 2001  . TRANSTHORACIC ECHOCARDIOGRAM  12-06-2007   normal LVF, ef 60%/  mild to moderate  AV  calcification without stenosis,  mild AR and TR,  mild LAE    Body mass index is 21.14 kg/m.   No Known Allergies   Current Outpatient Medications:  .  Accu-Chek FastClix Lancets MISC, USE 1 TO CHECK GLUCOSE ONCE DAILY, Disp: , Rfl:  .  ACCU-CHEK GUIDE test strip, USE 1 STRIP TO CHECK GLUCOSE ONCE DAILY, Disp: , Rfl:  .  aspirin 81 MG tablet, Take 81 mg by mouth every evening. , Disp: , Rfl:  .  enalapril (VASOTEC) 10 MG tablet, Take 10 mg by mouth daily., Disp: , Rfl:  .  finasteride (PROSCAR) 5 MG tablet, Take 1 tablet (5 mg total) by mouth daily., Disp: 90 tablet, Rfl: 3 .  hydrochlorothiazide (MICROZIDE) 12.5 MG capsule, Take 12.5 mg by mouth daily., Disp: , Rfl:  .  memantine (NAMENDA) 5 MG tablet, Take 5 mg by mouth 2 (two) times daily. , Disp: , Rfl: 11 .  metFORMIN (GLUCOPHAGE) 500 MG tablet, Take 500 mg by mouth 2 (two) times daily with a meal., Disp: , Rfl:  .  mirabegron ER (MYRBETRIQ) 25 MG TB24 tablet, Take 1 tablet (25 mg total) by mouth daily., Disp: 30 tablet, Rfl: 11 .  pravastatin (PRAVACHOL) 80 MG tablet, Take 80 mg by mouth daily., Disp: , Rfl:  .  donepezil (ARICEPT) 10 MG tablet, Take 10 mg by mouth at bedtime. , Disp: , Rfl:  .  polyethylene glycol powder (GLYCOLAX/MIRALAX) powder, Take 17 g by mouth daily. For a bowel movement (Patient  not taking: Reported on 09/08/2019), Disp: 255 g, Rfl: 3 .  tamsulosin (FLOMAX) 0.4 MG CAPS capsule, Take 1 capsule (0.4 mg total) by mouth 2 (two) times daily. (Patient not taking: Reported on 09/19/2019), Disp: 180 capsule, Rfl: 3 .  Wheat Dextrin (BENEFIBER PO), Take by mouth daily., Disp: , Rfl:    PHYSICAL EXAM SECTION: 1) BP (!) 136/56   Pulse 68   Temp (!) 97.4 F (36.3 C)   Ht 5\' 7"  (1.702 m)   Wt 135 lb (61.2 kg)   BMI 21.14 kg/m   Body mass index is 21.14 kg/m. General appearance: Well-developed well-nourished no gross deformities  2) Cardiovascular normal pulse and perfusion , normal color   3) Neurologically  deep tendon reflexes are equal and normal, no sensation loss or deficits no pathologic reflexes  4) Psychological: Awake alert and oriented x3 mood and affect normal  5) Skin no lacerations or ulcerations no nodularity no palpable masses, no erythema or nodularity  6) Musculoskeletal:   May have a proximal biceps rupture full biceps on flexion of the elbow but nontender I cannot reproduce any tenderness in the shoulder has good cuff strength normal range of motion   MEDICAL DECISION MAKING  A.  Encounter Diagnosis  Name Primary?  . Acute pain of right shoulder Yes    B. DATA ANALYSED:  IMAGING: Independent interpretation of images: X-rays show a very small chip bone near the rotator cuff insertion these x-rays were from [facility]  Orders: None  Outside records reviewed: No new orders or records  C. MANAGEMENT recommend symptomatic care avoid heavy lifting for 3 to 4 weeks  Take Tylenol for pain over-the-counter should be fine  No orders of the defined types were placed in this encounter.     Arther Abbott, MD  09/19/2019 11:15 AM

## 2019-09-19 NOTE — Patient Instructions (Signed)
Avoid heavy lifting for the next 3 to 4 weeks  You can take Tylenol 500 mg for pain as needed

## 2019-09-22 DIAGNOSIS — I2510 Atherosclerotic heart disease of native coronary artery without angina pectoris: Secondary | ICD-10-CM | POA: Diagnosis not present

## 2019-09-22 DIAGNOSIS — R413 Other amnesia: Secondary | ICD-10-CM | POA: Diagnosis not present

## 2019-09-22 DIAGNOSIS — E1129 Type 2 diabetes mellitus with other diabetic kidney complication: Secondary | ICD-10-CM | POA: Diagnosis not present

## 2019-09-22 DIAGNOSIS — Z79899 Other long term (current) drug therapy: Secondary | ICD-10-CM | POA: Diagnosis not present

## 2019-09-23 ENCOUNTER — Telehealth (INDEPENDENT_AMBULATORY_CARE_PROVIDER_SITE_OTHER): Payer: Self-pay | Admitting: *Deleted

## 2019-09-23 DIAGNOSIS — H903 Sensorineural hearing loss, bilateral: Secondary | ICD-10-CM | POA: Diagnosis not present

## 2019-09-23 NOTE — Telephone Encounter (Signed)
Spoke to Jordan [name] and let him know we were going to get him scheduled for a procedure.  Someone would be calling him tomorrow to get this arranged.  Told him if needed we could speak to his daughter.  He wanted to make sure Dr. Laural Golden was going to be doing procedure --answered yes   Told him he needed to keep that appointment with Dr. Laural Golden on 10/07/2019 also.  Asked also if he was taking anything for diarrhea--he answered something over the counter.  Asked if Dr.Fagan hold called him in something and he thought maybe but he told him about this OTC so that is what he is taking.  Very appreciative and he understood we were trying to take care of him as quickly as we could--he understood and again was appreciative.

## 2019-09-24 ENCOUNTER — Encounter (INDEPENDENT_AMBULATORY_CARE_PROVIDER_SITE_OTHER): Payer: Self-pay | Admitting: *Deleted

## 2019-09-24 NOTE — Telephone Encounter (Signed)
Patient was called and a message left, this is in regard to the patient taking the Imodium on a schedule per Dr.Rehman. It was explained on the message but did tell patient to call office if he had questions.

## 2019-09-29 DIAGNOSIS — R197 Diarrhea, unspecified: Secondary | ICD-10-CM | POA: Diagnosis not present

## 2019-09-29 DIAGNOSIS — E1129 Type 2 diabetes mellitus with other diabetic kidney complication: Secondary | ICD-10-CM | POA: Diagnosis not present

## 2019-09-29 DIAGNOSIS — E785 Hyperlipidemia, unspecified: Secondary | ICD-10-CM | POA: Diagnosis not present

## 2019-09-30 ENCOUNTER — Other Ambulatory Visit: Payer: Self-pay

## 2019-09-30 ENCOUNTER — Other Ambulatory Visit (HOSPITAL_COMMUNITY)
Admission: RE | Admit: 2019-09-30 | Discharge: 2019-09-30 | Disposition: A | Discharge status: Home / Self Care | Payer: Medicare Other | Source: Ambulatory Visit | Attending: Internal Medicine | Admitting: Internal Medicine

## 2019-09-30 ENCOUNTER — Other Ambulatory Visit (INDEPENDENT_AMBULATORY_CARE_PROVIDER_SITE_OTHER): Payer: Self-pay | Admitting: *Deleted

## 2019-09-30 DIAGNOSIS — Z20822 Contact with and (suspected) exposure to covid-19: Secondary | ICD-10-CM | POA: Diagnosis not present

## 2019-09-30 DIAGNOSIS — Z01812 Encounter for preprocedural laboratory examination: Secondary | ICD-10-CM | POA: Insufficient documentation

## 2019-09-30 DIAGNOSIS — R159 Full incontinence of feces: Secondary | ICD-10-CM

## 2019-09-30 LAB — SARS CORONAVIRUS 2 (TAT 6-24 HRS): SARS Coronavirus 2: NEGATIVE

## 2019-10-02 ENCOUNTER — Encounter (HOSPITAL_COMMUNITY): Payer: Self-pay | Admitting: Internal Medicine

## 2019-10-02 ENCOUNTER — Ambulatory Visit (HOSPITAL_COMMUNITY)
Admission: RE | Admit: 2019-10-02 | Discharge: 2019-10-02 | Disposition: A | Discharge status: Home / Self Care | Payer: Medicare Other | Attending: Internal Medicine | Admitting: Internal Medicine

## 2019-10-02 ENCOUNTER — Other Ambulatory Visit: Payer: Self-pay

## 2019-10-02 ENCOUNTER — Encounter (HOSPITAL_COMMUNITY)
Admission: RE | Disposition: A | Discharge status: Home / Self Care | Payer: Self-pay | Source: Home / Self Care | Attending: Internal Medicine

## 2019-10-02 DIAGNOSIS — I2510 Atherosclerotic heart disease of native coronary artery without angina pectoris: Secondary | ICD-10-CM | POA: Diagnosis not present

## 2019-10-02 DIAGNOSIS — Z79899 Other long term (current) drug therapy: Secondary | ICD-10-CM | POA: Diagnosis not present

## 2019-10-02 DIAGNOSIS — Z7984 Long term (current) use of oral hypoglycemic drugs: Secondary | ICD-10-CM | POA: Insufficient documentation

## 2019-10-02 DIAGNOSIS — Z87891 Personal history of nicotine dependence: Secondary | ICD-10-CM | POA: Diagnosis not present

## 2019-10-02 DIAGNOSIS — K5730 Diverticulosis of large intestine without perforation or abscess without bleeding: Secondary | ICD-10-CM | POA: Diagnosis not present

## 2019-10-02 DIAGNOSIS — Z8249 Family history of ischemic heart disease and other diseases of the circulatory system: Secondary | ICD-10-CM | POA: Insufficient documentation

## 2019-10-02 DIAGNOSIS — K52832 Lymphocytic colitis: Secondary | ICD-10-CM | POA: Diagnosis not present

## 2019-10-02 DIAGNOSIS — Z955 Presence of coronary angioplasty implant and graft: Secondary | ICD-10-CM | POA: Diagnosis not present

## 2019-10-02 DIAGNOSIS — Z7982 Long term (current) use of aspirin: Secondary | ICD-10-CM | POA: Insufficient documentation

## 2019-10-02 DIAGNOSIS — M1990 Unspecified osteoarthritis, unspecified site: Secondary | ICD-10-CM | POA: Diagnosis not present

## 2019-10-02 DIAGNOSIS — K644 Residual hemorrhoidal skin tags: Secondary | ICD-10-CM | POA: Diagnosis not present

## 2019-10-02 DIAGNOSIS — I10 Essential (primary) hypertension: Secondary | ICD-10-CM | POA: Diagnosis not present

## 2019-10-02 DIAGNOSIS — E785 Hyperlipidemia, unspecified: Secondary | ICD-10-CM | POA: Insufficient documentation

## 2019-10-02 DIAGNOSIS — K5289 Other specified noninfective gastroenteritis and colitis: Secondary | ICD-10-CM

## 2019-10-02 DIAGNOSIS — E119 Type 2 diabetes mellitus without complications: Secondary | ICD-10-CM | POA: Insufficient documentation

## 2019-10-02 DIAGNOSIS — N400 Enlarged prostate without lower urinary tract symptoms: Secondary | ICD-10-CM | POA: Diagnosis not present

## 2019-10-02 DIAGNOSIS — Z8719 Personal history of other diseases of the digestive system: Secondary | ICD-10-CM | POA: Diagnosis not present

## 2019-10-02 DIAGNOSIS — R197 Diarrhea, unspecified: Secondary | ICD-10-CM | POA: Diagnosis not present

## 2019-10-02 DIAGNOSIS — R150 Incomplete defecation: Secondary | ICD-10-CM | POA: Diagnosis not present

## 2019-10-02 DIAGNOSIS — Z86010 Personal history of colonic polyps: Secondary | ICD-10-CM | POA: Insufficient documentation

## 2019-10-02 DIAGNOSIS — R159 Full incontinence of feces: Secondary | ICD-10-CM

## 2019-10-02 HISTORY — PX: FLEXIBLE SIGMOIDOSCOPY: SHX5431

## 2019-10-02 HISTORY — PX: BIOPSY: SHX5522

## 2019-10-02 LAB — GLUCOSE, CAPILLARY: Glucose-Capillary: 120 mg/dL — ABNORMAL HIGH (ref 70–99)

## 2019-10-02 SURGERY — SIGMOIDOSCOPY, FLEXIBLE
Anesthesia: Moderate Sedation

## 2019-10-02 MED ORDER — SENNA-DOCUSATE SODIUM 8.6-50 MG PO TABS: 1.0000 | ORAL_TABLET | Freq: Every day | ORAL | Status: DC

## 2019-10-02 MED ORDER — BENEFIBER PO POWD: 4.0000 g | Freq: Every day | ORAL | 0 refills | Status: DC

## 2019-10-02 MED ORDER — SODIUM CHLORIDE 0.9 % IV SOLN: INTRAVENOUS | Status: DC

## 2019-10-02 MED ORDER — MEPERIDINE HCL 50 MG/ML IJ SOLN
INTRAMUSCULAR | Status: AC
  Filled 2019-10-02: qty 1

## 2019-10-02 MED ORDER — MIDAZOLAM HCL 5 MG/5ML IJ SOLN
INTRAMUSCULAR | Status: DC | PRN
  Administered 2019-10-02 (×2): 1 mg via INTRAVENOUS

## 2019-10-02 MED ORDER — MIDAZOLAM HCL 5 MG/5ML IJ SOLN
INTRAMUSCULAR | Status: AC
  Filled 2019-10-02: qty 10

## 2019-10-02 MED ORDER — MEPERIDINE HCL 25 MG/ML IJ SOLN
INTRAMUSCULAR | Status: DC | PRN
  Administered 2019-10-02: 15 mg via INTRAVENOUS

## 2019-10-02 NOTE — Op Note (Signed)
[facility] Patient Name: Jordan [name] Procedure Date: 10/02/2019 8:06 AM MRN: [id number] Date of Birth: Oct 24, 1922 Attending MD: Hildred Laser , MD CSN: [id number] Age: 86 Admit Type: Outpatient Procedure:                Flexible Sigmoidoscopy Indications:              Diarrhea Providers:                Hildred Laser, MD, Janeece Riggers, RN, Aram Candela Referring MD:             Asencion Noble, MD Medicines:                Meperidine 15 mg IV, Midazolam 2 mg IV Complications:            No immediate complications. Estimated Blood Loss:     Estimated blood loss was minimal. Procedure:                Pre-Anesthesia Assessment:                           - Prior to the procedure, a History and Physical                            was performed, and patient medications and                            allergies were reviewed. The patient's tolerance of                            previous anesthesia was also reviewed. The risks                            and benefits of the procedure and the sedation                            options and risks were discussed with the patient.                            All questions were answered, and informed consent                            was obtained. Prior Anticoagulants: The patient has                            taken no previous anticoagulant or antiplatelet                            agents except for aspirin. ASA Grade Assessment:                            III - A patient with severe systemic disease. After                            reviewing the risks and benefits, the patient was  deemed in satisfactory condition to undergo the                            procedure.                           After obtaining informed consent, the scope was                            passed under direct vision. The PCF-[id number]                            ([id number]) scope was introduced through the anus and   advanced to the the splenic flexure. The flexible                            sigmoidoscopy was accomplished without difficulty.                            The patient tolerated the procedure well. The                            quality of the bowel preparation was fair. Scope In: 12:53:18 PM Scope Out: 1:01:48 PM Total Procedure Duration: 0 hours 8 minutes 30 seconds  Findings:      The perianal and digital rectal examinations were normal.      A moderate amount of semi-solid solid stool was found in the sigmoid       colon, in the descending colon and at the splenic flexure.      Scattered diverticula were found in the sigmoid colon and descending       colon.      The rectum, recto-sigmoid colon, sigmoid colon, descending colon and       splenic flexure appeared normal. Biopsies for histology were taken with       a cold forceps from the descending colon and sigmoid colon for       evaluation of microscopic colitis. The pathology specimen was placed       into Bottle Number 1.      External hemorrhoids were found during retroflexion. The hemorrhoids       were small. Impression:               - Preparation of the colon was fair.                           - Stool in the sigmoid colon, in the descending                            colon and at the splenic flexure.                           - Diverticulosis in the sigmoid colon and in the                            descending colon.                           -  The rectum, recto-sigmoid colon, sigmoid colon,                            descending colon and splenic flexure are normal.                            Biopsied.                           - External hemorrhoids.                           Comment; ? spurious diarrhea due to incomplete                            evacuation. Moderate Sedation:      Moderate (conscious) sedation was administered by the endoscopy nurse       and supervised by the endoscopist. The following parameters  were       monitored: oxygen saturation, heart rate, blood pressure, CO2       capnography and response to care. Total physician intraservice time was       11 minutes. Recommendation:           - Discharge patient to home with daughter.                           - Continue present medications.                           - Resume aspirin at prior dose tomorrow.                           - Wait for path results.                           - Benfiber 4 g po qhs.                           - Pericolace 1 tablet po qhs.                           - Stool diary for two weeks. Procedure Code(s):        --- Professional ---                           [id number], Sigmoidoscopy, flexible; with biopsy, single                            or multiple                           G0500, Moderate sedation services provided by the                            same physician or other qualified health care  professional performing a gastrointestinal                            endoscopic service that sedation supports,                            requiring the presence of an independent trained                            observer to assist in the monitoring of the                            patient's level of consciousness and physiological                            status; initial 15 minutes of intra-service time;                            patient age 48 years or older (additional time may                            be reported with [phone], as appropriate) Diagnosis Code(s):        --- Professional ---                           K64.4, Residual hemorrhoidal skin tags                           R19.7, Diarrhea, unspecified                           K57.30, Diverticulosis of large intestine without                            perforation or abscess without bleeding CPT copyright 2019 American Medical Association. All rights reserved. The codes documented in this report are preliminary and upon coder  review may  be revised to meet current compliance requirements. Hildred Laser, MD Hildred Laser, MD 10/02/2019 1:15:22 PM This report has been signed electronically. Number of Addenda: 0

## 2019-10-02 NOTE — Discharge Instructions (Signed)
Resume aspirin on 10/03/2019. Resume other medications as before. Benefiber 4 g by mouth daily at bedtime. Peri-Colace 1 tablet by mouth daily at bedtime. High-fiber diet. No driving for 24 hours. Physician will call with biopsy results.  Please keep stool diary as to frequency and consistency of stools daily for the next 2 weeks.      Flexible Sigmoidoscopy, Care After This sheet gives you information about how to care for yourself after your procedure. Your health care provider may also give you more specific instructions. If you have problems or questions, contact your health care provider. What can I expect after the procedure? After the procedure, it is common to have:  Abdominal cramping or pain.  Bloating.  A small amount of rectal bleeding if you had a biopsy. Follow these instructions at home:  Take over-the-counter and prescription medicines only as told by your health care provider.  Do not drive for 24 hours if you received a medicine to help you relax (sedative).  Keep all follow-up visits as told by your health care provider. This is important. Contact a health care provider if:  You have abdominal pain or cramping that gets worse or is not helped with medicine.  You continue to have small amounts of rectal bleeding after 24 hours.  You have nausea or vomiting.  You feel weak or dizzy.  You have a fever. Get help right away if:  You pass large blood clots or see a large amount of blood in the toilet after having a bowel movement.  You have nausea or vomiting for more than 24 hours after the procedure. This information is not intended to replace advice given to you by your health care provider. Make sure you discuss any questions you have with your health care provider. Document Revised: 02/24/2016 Document Reviewed: 10/02/2015 Elsevier Patient Education  [place].     High-Fiber Diet Fiber, also called dietary fiber, is a type of  carbohydrate that is found in fruits, vegetables, whole grains, and beans. A high-fiber diet can have many health benefits. Your health care provider may recommend a high-fiber diet to help:  Prevent constipation. Fiber can make your bowel movements more regular.  Lower your cholesterol.  Relieve the following conditions: ? Swelling of veins in the anus (hemorrhoids). ? Swelling and irritation (inflammation) of specific areas of the digestive tract (uncomplicated diverticulosis). ? A problem of the large intestine (colon) that sometimes causes pain and diarrhea (irritable bowel syndrome, IBS).  Prevent overeating as part of a weight-loss plan.  Prevent heart disease, type 2 diabetes, and certain cancers. What is my plan? The recommended daily fiber intake in grams (g) includes:  38 g for men age 75 or younger.  30 g for men over age 13.  17 g for women age 73 or younger.  21 g for women over age 57. You can get the recommended daily intake of dietary fiber by:  Eating a variety of fruits, vegetables, grains, and beans.  Taking a fiber supplement, if it is not possible to get enough fiber through your diet. What do I need to know about a high-fiber diet?  It is better to get fiber through food sources rather than from fiber supplements. There is not a lot of research about how effective supplements are.  Always check the fiber content on the nutrition facts label of any prepackaged food. Look for foods that contain 5 g of fiber or more per serving.  Talk with a diet  and nutrition specialist (dietitian) if you have questions about specific foods that are recommended or not recommended for your medical condition, especially if those foods are not listed below.  Gradually increase how much fiber you consume. If you increase your intake of dietary fiber too quickly, you may have bloating, cramping, or gas.  Drink plenty of water. Water helps you to digest fiber. What are tips for  following this plan?  Eat a wide variety of high-fiber foods.  Make sure that half of the grains that you eat each day are whole grains.  Eat breads and cereals that are made with whole-grain flour instead of refined flour or white flour.  Eat brown rice, bulgur wheat, or millet instead of white rice.  Start the day with a breakfast that is high in fiber, such as a cereal that contains 5 g of fiber or more per serving.  Use beans in place of meat in soups, salads, and pasta dishes.  Eat high-fiber snacks, such as berries, raw vegetables, nuts, and popcorn.  Choose whole fruits and vegetables instead of processed forms like juice or sauce. What foods can I eat?  Fruits Berries. Pears. Apples. Oranges. Avocado. Prunes and raisins. Dried figs. Vegetables Sweet potatoes. Spinach. Kale. Artichokes. Cabbage. Broccoli. Cauliflower. Green peas. Carrots. Squash. Grains Whole-grain breads. Multigrain cereal. Oats and oatmeal. Brown rice. Barley. Bulgur wheat. [place]. Quinoa. Bran muffins. Popcorn. Rye wafer crackers. Meats and other proteins Navy, kidney, and pinto beans. Soybeans. Split peas. Lentils. Nuts and seeds. Dairy Fiber-fortified yogurt. Beverages Fiber-fortified soy milk. Fiber-fortified orange juice. Other foods Fiber bars. The items listed above may not be a complete list of recommended foods and beverages. Contact a dietitian for more options. What foods are not recommended? Fruits Fruit juice. Cooked, strained fruit. Vegetables Fried potatoes. Canned vegetables. Well-cooked vegetables. Grains White bread. Pasta made with refined flour. White rice. Meats and other proteins Fatty cuts of meat. Fried chicken or fried fish. Dairy Milk. Yogurt. Cream cheese. Sour cream. Fats and oils Butters. Beverages Soft drinks. Other foods Cakes and pastries. The items listed above may not be a complete list of foods and beverages to avoid. Contact a dietitian for more  information. Summary  Fiber is a type of carbohydrate. It is found in fruits, vegetables, whole grains, and beans.  There are many health benefits of eating a high-fiber diet, such as preventing constipation, lowering blood cholesterol, helping with weight loss, and reducing your risk of heart disease, diabetes, and certain cancers.  Gradually increase your intake of fiber. Increasing too fast can result in cramping, bloating, and gas. Drink plenty of water while you increase your fiber.  The best sources of fiber include whole fruits and vegetables, whole grains, nuts, seeds, and beans. This information is not intended to replace advice given to you by your health care provider. Make sure you discuss any questions you have with your health care provider. Document Revised: 05/07/2017 Document Reviewed: 05/07/2017 Elsevier Patient Education  2020 [employer].

## 2019-10-02 NOTE — H&P (Signed)
Jordan [name] is an 86 y.o. male.   Chief Complaint: Patient is here for flexible sigmoidoscopy. HPI: Patient is 86 year old Caucasian male who has been having diarrhea for 6 to 7 weeks.  Stool studies were negative.  He had GI pathogen panel and C. difficile sent.  He has been using antidiarrheals with some benefit.  No history of melena or rectal bleeding fever chills nausea or vomiting.  No history of antibiotic use.  He has been having as many as 4-5 stools per day.  He has had 1 or 2 accidents because he could not make it to the bathroom.  He has lost 8 pounds.  He feels weight loss is because he is afraid to eat.  No prior history of similar episodes.  Since he has history of pancreatitis(2017) he also screen him for pancreatic insufficiency with fecal elastase and test result was normal.  He is on Metformin but he has been on that medication for a long time.  Past Medical History:  Diagnosis Date  . Arthritis   . BPH (benign prostatic hypertrophy)   . CAD (coronary artery disease)    DES to RCA 2009  . Diverticulosis of colon   . Essential hypertension   . Fecal urgency   . History of adenomatous polyp of colon    Tubular adenomas 2016  . Hyperlipidemia   . Lower urinary tract symptoms (LUTS)   . Type 2 diabetes mellitus ([place])   . Wears glasses     Past Surgical History:  Procedure Laterality Date  . CATARACT EXTRACTION W/ INTRAOCULAR LENS  IMPLANT, BILATERAL    . COLONOSCOPY  last one 11/16/2014   polpectomy  . CORONARY ANGIOPLASTY WITH STENT PLACEMENT  12-23-2007  dr hochrein   (abnormal myoview)  PCI w/ DES x1 pRCA (95%),  pCFX 25%,  mCFX 40-50%,  after PDA 30%,  prox, mid, and ostial LAD  30% calcification,  LM diffuse 25% calcification,  ef 60%  . EVALUATION UNDER ANESTHESIA WITH ANAL FISTULECTOMY [name] 06/03/2015   Procedure: ANAL EXAM UNDER ANESTHESIA ,FISTULOTOMY ;  Surgeon: [name], MD;  Location: [facility];  Service: General;  Laterality:  [name];  . INGUINAL HERNIA REPAIR Right 2001  . TRANSTHORACIC ECHOCARDIOGRAM  12-06-2007   normal LVF, ef 60%/  mild to moderate  AV calcification without stenosis,  mild AR and TR,  mild LAE    Family History  Problem Relation Age of Onset  . Heart disease Brother   . Colon cancer Neg Hx    Social History:  reports that he quit smoking about 36 years ago. His smoking use included cigarettes. He quit after 30.00 years of use. He has never used smokeless tobacco. He reports that he does not drink alcohol or use drugs.  Allergies: No Known Allergies  Medications Prior to Admission  Medication Sig Dispense Refill  . aspirin 81 MG tablet Take 81 mg by mouth every evening.     . enalapril (VASOTEC) 10 MG tablet Take 10 mg by mouth daily.    . finasteride (PROSCAR) 5 MG tablet Take 1 tablet (5 mg total) by mouth daily. 90 tablet 3  . hydrochlorothiazide (MICROZIDE) 12.5 MG capsule Take 12.5 mg by mouth daily.    . memantine (NAMENDA) 5 MG tablet Take 5 mg by mouth daily.   11  . metFORMIN (GLUCOPHAGE) 500 MG tablet Take 500 mg by mouth at bedtime.     . mirabegron ER (MYRBETRIQ) 25 MG TB24 tablet Take  1 tablet (25 mg total) by mouth daily. 30 tablet 11  . Multiple Vitamins-Minerals (PRESERVISION AREDS PO) Take 1 tablet by mouth daily.    . polyethylene glycol powder (GLYCOLAX/MIRALAX) powder Take 17 g by mouth daily. For a bowel movement (Patient taking differently: Take 17 g by mouth at bedtime. For a bowel movement) 255 g 3  . pravastatin (PRAVACHOL) 80 MG tablet Take 80 mg by mouth daily.    . Wheat Dextrin (BENEFIBER PO) Take 1 tablet by mouth daily.     . Accu-Chek FastClix Lancets MISC USE 1 TO CHECK GLUCOSE ONCE DAILY    . ACCU-CHEK GUIDE test strip USE 1 STRIP TO CHECK GLUCOSE ONCE DAILY    . tamsulosin (FLOMAX) 0.4 MG CAPS capsule Take 1 capsule (0.4 mg total) by mouth 2 (two) times daily. (Patient not taking: Reported on 09/19/2019) 180 capsule 3    Results for orders placed or  performed during the hospital encounter of 10/02/19 (from the past 48 hour(s))  Glucose, capillary     Status: Abnormal   Collection Time: 10/02/19 12:33 PM  Result Value Ref Range   Glucose-Capillary 120 (H) 70 - 99 mg/dL    Comment: Glucose reference range applies only to samples taken after fasting for at least 8 hours.   No results found.  Review of Systems  Blood pressure (!) 165/69, pulse 77, temperature 97.8 F (36.6 C), temperature source Oral, resp. rate 20, height 5\' 7"  (1.702 m), weight 61.2 kg, SpO2 99 %. Physical Exam  Constitutional:  Well-developed thin Caucasian male in NAD.  HENT:  Mouth/Throat: Oropharynx is clear and moist.  Eyes: Conjunctivae are normal. No scleral icterus.  Neck: No thyromegaly present.  Cardiovascular: Normal rate and regular rhythm.  Murmur heard. Grade 3/6 systolic ejection murmur best heard at aortic area.  Respiratory: Effort normal and breath sounds normal.  GI:  Abdomen is flat.  On palpation is soft and nontender with organomegaly or masses.  Musculoskeletal:        General: No edema.  Neurological: He is alert.  Skin: Skin is warm and dry.     Assessment/Plan Diarrhea with negative stool studies. Diagnostic flexible sigmoidoscopy.  Hildred Laser, MD 10/02/2019, 12:45 PM

## 2019-10-06 LAB — SURGICAL PATHOLOGY

## 2019-10-07 ENCOUNTER — Ambulatory Visit (INDEPENDENT_AMBULATORY_CARE_PROVIDER_SITE_OTHER): Payer: Medicare Other | Admitting: Internal Medicine

## 2019-10-07 ENCOUNTER — Encounter (INDEPENDENT_AMBULATORY_CARE_PROVIDER_SITE_OTHER): Payer: Self-pay

## 2019-10-09 DIAGNOSIS — M25552 Pain in left hip: Secondary | ICD-10-CM | POA: Diagnosis not present

## 2019-10-13 ENCOUNTER — Encounter (INDEPENDENT_AMBULATORY_CARE_PROVIDER_SITE_OTHER): Payer: Self-pay | Admitting: Internal Medicine

## 2019-10-13 ENCOUNTER — Ambulatory Visit (INDEPENDENT_AMBULATORY_CARE_PROVIDER_SITE_OTHER): Payer: Medicare Other | Admitting: Internal Medicine

## 2019-10-13 ENCOUNTER — Other Ambulatory Visit: Payer: Self-pay

## 2019-10-13 DIAGNOSIS — K52832 Lymphocytic colitis: Secondary | ICD-10-CM | POA: Insufficient documentation

## 2019-10-13 MED ORDER — BUDESONIDE 3 MG PO CPEP: 3.0000 mg | ORAL_CAPSULE | Freq: Every day | ORAL | 0 refills | Status: DC

## 2019-10-13 NOTE — Patient Instructions (Signed)
Take Entocort/budesonide 9 mg / 3 tablets by mouth every morning with breakfast for 2 weeks followed by 6 mg / 2 tablets every morning for 2 weeks and then 3 mg / 1 tablet by mouth every morning for 2 weeks. Please call office with progress report in 3 to 4 weeks or earlier if you experience any side effects.

## 2019-10-13 NOTE — Progress Notes (Addendum)
Presenting complaint;  Follow-up for diarrhea.  Database and subjective:  Patient is 86 year old Caucasian male who is a poor evaluation of diarrhea which he states started 57 days ago.  He had stool studies by Dr. Willey Blade.  GI pathogen panel and stool testing for C. difficile was negative.  He did not respond to symptomatic therapy.  He began to lose weight. Patient underwent flexible sigmoidoscopy for 10/02/2019.  There was no endoscopic colitis.  He had mild sigmoid diverticulosis and semiformed and formed stool in the sigmoid colon.  He also had small external hemorrhoids.  Biopsy revealed changes of lymphocytic colitis.  I went over the biopsy results with his daughter [name] over the weekend.  Patient is alert discussed the results and treatment.  He remains with diarrhea.  He has anywhere from 2-5 stools per day.  He has kept stools diarrhea.  Most of his stools are loose and soft but he has not had a normal bowel.  He denies abdominal pain rectal bleeding or nocturnal diarrhea.  He states he has lost 8 pounds since his diarrhea started.  Fiber supplement 4 g daily as recommended at the time of sigmoidoscopy.  He has tried antidiarrheals in the past but did not help.  Current Medications: Outpatient Encounter Medications as of 10/13/2019  Medication Sig  . Accu-Chek FastClix Lancets MISC USE 1 TO CHECK GLUCOSE ONCE DAILY  . ACCU-CHEK GUIDE test strip USE 1 STRIP TO CHECK GLUCOSE ONCE DAILY  . aspirin 81 MG tablet Take 1 tablet (81 mg total) by mouth every evening.  . enalapril (VASOTEC) 10 MG tablet Take 10 mg by mouth daily.  . finasteride (PROSCAR) 5 MG tablet Take 1 tablet (5 mg total) by mouth daily.  . hydrochlorothiazide (MICROZIDE) 12.5 MG capsule Take 12.5 mg by mouth daily.  . memantine (NAMENDA) 5 MG tablet Take 5 mg by mouth daily.   . metFORMIN (GLUCOPHAGE) 500 MG tablet Take 500 mg by mouth at bedtime.   . mirabegron ER (MYRBETRIQ) 25 MG TB24 tablet Take 1 tablet (25 mg total) by  mouth daily.  . Multiple Vitamins-Minerals (PRESERVISION AREDS PO) Take 1 tablet by mouth daily.  . pravastatin (PRAVACHOL) 80 MG tablet Take 80 mg by mouth daily.  . Wheat Dextrin (BENEFIBER) POWD Take 4 g by mouth at bedtime.  . sennosides-docusate sodium (SENOKOT-S) 8.6-50 MG tablet Take 1 tablet by mouth at bedtime. (Patient not taking: Reported on 10/13/2019)   No facility-administered encounter medications on file as of 10/13/2019.     Objective: Blood pressure (!) 161/64, pulse 66, temperature (!) 97.5 F (36.4 C), temperature source Temporal, height 5\' 7"  (1.702 m), weight 140 lb 6.4 oz (63.7 kg). Patient is alert and in no acute distress. He is wearing facial mask. Conjunctiva is pink. Sclera is nonicteric Oropharyngeal mucosa is normal. No neck masses or thyromegaly noted. Cardiac exam with regular rhythm normal S1 and S2.  He has grade 3/6 systolic murmur best heard at aortic area. Lungs are clear to auscultation. Abdomen is flat.  Bowel sounds are normal.  On palpation abdomen is soft and nontender without organomegaly or masses. No LE edema or clubbing noted.   Assessment:  #1.  Diarrhea of almost 2 months duration associated with 8 pound weight loss.  He underwent flexible sigmoidoscopy on 10/02/2019 revealing normal mucosa with biopsy reveals lymphocytic colitis.  Comfort could be one of his medications such as statin or low-dose aspirin.  Would not recommend changing therapy at this time.  Treatment options  reviewed with patient to include Pepto-Bismol or budesonide.  He he is looking forward to relief.  Plan:  Begin budesonide as follows 9 mg by mouth every morning for 2 weeks followed by 6 mg by mouth every morning for 2 weeks followed by 3 mg by mouth every morning for 2 weeks and stop. Patient will continue stool diary and call with progress noted 3 to 4 weeks. Office visit on as-needed basis.

## 2019-10-27 ENCOUNTER — Encounter (INDEPENDENT_AMBULATORY_CARE_PROVIDER_SITE_OTHER): Payer: Self-pay | Admitting: *Deleted

## 2019-10-27 ENCOUNTER — Telehealth (INDEPENDENT_AMBULATORY_CARE_PROVIDER_SITE_OTHER): Payer: Self-pay | Admitting: Internal Medicine

## 2019-10-27 NOTE — Telephone Encounter (Signed)
A response was sent through [place] [facility].

## 2019-10-27 NOTE — Telephone Encounter (Signed)
Daughter left voice mail message wanting to know if patient is supposed to still be taking Senokot - please advise [phone]

## 2019-10-30 DIAGNOSIS — M25552 Pain in left hip: Secondary | ICD-10-CM | POA: Diagnosis not present

## 2019-11-18 ENCOUNTER — Telehealth (INDEPENDENT_AMBULATORY_CARE_PROVIDER_SITE_OTHER): Payer: Self-pay | Admitting: Internal Medicine

## 2019-11-18 NOTE — Telephone Encounter (Signed)
Daughter called stated patient is still having issues with diarrhea and has finished the prednisone - please advise - [phone]

## 2019-11-19 ENCOUNTER — Other Ambulatory Visit: Payer: Self-pay

## 2019-11-19 ENCOUNTER — Ambulatory Visit (INDEPENDENT_AMBULATORY_CARE_PROVIDER_SITE_OTHER): Payer: Medicare Other | Admitting: Urology

## 2019-11-19 ENCOUNTER — Encounter: Payer: Self-pay | Admitting: Urology

## 2019-11-19 VITALS — BP 125/68 | HR 85 | Temp 98.2°F | Ht 67.0 in | Wt 140.0 lb

## 2019-11-19 DIAGNOSIS — N401 Enlarged prostate with lower urinary tract symptoms: Secondary | ICD-10-CM

## 2019-11-19 DIAGNOSIS — R351 Nocturia: Secondary | ICD-10-CM

## 2019-11-19 DIAGNOSIS — N138 Other obstructive and reflux uropathy: Secondary | ICD-10-CM

## 2019-11-19 MED ORDER — SILODOSIN 8 MG PO CAPS: 8.0000 mg | ORAL_CAPSULE | Freq: Every day | ORAL | 3 refills | Status: DC

## 2019-11-19 NOTE — Progress Notes (Signed)
11/19/2019 3:17 PM   Jordan [name] 30-Jul-1922 [id number]  Referring provider: Asencion Noble, MD [address] [place],  [place] [address]  nocturia  HPI: [name] is a 86yo here for followup for BPH and nocturia. He was place don sanctura last visit which failed to improve his nocturia. He is on flomax BID and finasteride. Stream weak. Nocturia 4-5x. No hematuria or dysuria    PMH: Past Medical History:  Diagnosis Date  . Arthritis   . BPH (benign prostatic hypertrophy)   . CAD (coronary artery disease)    DES to RCA 2009  . Diverticulosis of colon   . Essential hypertension   . Fecal urgency   . History of adenomatous polyp of colon    Tubular adenomas 2016  . Hyperlipidemia   . Lower urinary tract symptoms (LUTS)   . Type 2 diabetes mellitus ([place])   . Wears glasses     Surgical History: Past Surgical History:  Procedure Laterality Date  . BIOPSY  10/02/2019   Procedure: BIOPSY;  Surgeon: Rogene Houston, MD;  [facility];  Service: Endoscopy;;  . CATARACT EXTRACTION W/ INTRAOCULAR LENS  IMPLANT, BILATERAL    . COLONOSCOPY  last one 11/16/2014   polpectomy  . CORONARY ANGIOPLASTY WITH STENT PLACEMENT  12-23-2007  dr hochrein   (abnormal myoview)  PCI w/ DES x1 pRCA (95%),  pCFX 25%,  mCFX 40-50%,  after PDA 30%,  prox, mid, and ostial LAD  30% calcification,  LM diffuse 25% calcification,  ef 60%  . EVALUATION UNDER ANESTHESIA WITH ANAL FISTULECTOMY [name] 06/03/2015   Procedure: ANAL EXAM UNDER ANESTHESIA ,FISTULOTOMY ;  Surgeon: [name], MD;  Location: [facility];  Service: General;  Laterality: [name];  . FLEXIBLE SIGMOIDOSCOPY [name] 10/02/2019   Procedure: FLEXIBLE SIGMOIDOSCOPY;  Surgeon: Rogene Houston, MD;  [facility];  Service: Endoscopy;  Laterality: [name];  230  . INGUINAL HERNIA REPAIR Right 2001  . TRANSTHORACIC ECHOCARDIOGRAM  12-06-2007   normal LVF, ef 60%/  mild to moderate  AV calcification  without stenosis,  mild AR and TR,  mild LAE    Home Medications:  Allergies as of 11/19/2019   No Known Allergies     Medication List       Accurate as of Nov 19, 2019  3:17 PM. If you have any questions, ask your nurse or doctor.        Accu-Chek FastClix Lancets Misc USE 1 TO CHECK GLUCOSE ONCE DAILY   Accu-Chek Guide test strip Generic drug: glucose blood USE 1 STRIP TO CHECK GLUCOSE ONCE DAILY   aspirin 81 MG tablet Take 1 tablet (81 mg total) by mouth every evening.   Benefiber Powd Take 4 g by mouth at bedtime.   budesonide 3 MG 24 hr capsule Commonly known as: ENTOCORT EC Take 1 capsule (3 mg total) by mouth daily.   enalapril 10 MG tablet Commonly known as: VASOTEC Take 10 mg by mouth daily.   finasteride 5 MG tablet Commonly known as: PROSCAR Take 1 tablet (5 mg total) by mouth daily.   hydrochlorothiazide 12.5 MG capsule Commonly known as: MICROZIDE Take 12.5 mg by mouth daily.   memantine 5 MG tablet Commonly known as: NAMENDA Take 5 mg by mouth daily.   metFORMIN 500 MG tablet Commonly known as: GLUCOPHAGE Take 500 mg by mouth at bedtime.   mirabegron ER 25 MG Tb24 tablet Commonly known as: MYRBETRIQ Take 1 tablet (25 mg  total) by mouth daily.   pravastatin 80 MG tablet Commonly known as: PRAVACHOL Take 80 mg by mouth daily.   PRESERVISION AREDS PO Take 1 tablet by mouth daily.   sennosides-docusate sodium 8.6-50 MG tablet Commonly known as: SENOKOT-S Take 1 tablet by mouth at bedtime.   timolol 0.5 % ophthalmic solution Commonly known as: TIMOPTIC Apply to eye.       Allergies: No Known Allergies  Family History: Family History  Problem Relation Age of Onset  . Heart disease Brother   . Colon cancer Neg Hx     Social History:  reports that he quit smoking about 36 years ago. His smoking use included cigarettes. He quit after 30.00 years of use. He has never used smokeless tobacco. He reports that he does not drink alcohol  or use drugs.  ROS: All other review of systems were reviewed and are negative except what is noted above in HPI  Physical Exam: BP 125/68   Pulse 85   Temp 98.2 F (36.8 C)   Ht 5\' 7"  (1.702 m)   Wt 140 lb (63.5 kg)   BMI 21.93 kg/m   Constitutional:  Alert and oriented, No acute distress. HEENT: [place] AT, moist mucus membranes.  Trachea midline, no masses. Cardiovascular: No clubbing, cyanosis, or edema. Respiratory: Normal respiratory effort, no increased work of breathing. GI: Abdomen is soft, nontender, nondistended, no abdominal masses GU: No CVA tenderness.  Lymph: No cervical or inguinal lymphadenopathy. Skin: No rashes, bruises or suspicious lesions. Neurologic: Grossly intact, no focal deficits, moving all 4 extremities. Psychiatric: Normal mood and affect.  Laboratory Data: Lab Results  Component Value Date   WBC 7.6 09/08/2019   HGB 11.9 (L) 09/08/2019   HCT 35.7 (L) 09/08/2019   MCV 89.5 09/08/2019   PLT 363 09/08/2019    Lab Results  Component Value Date   CREATININE 1.09 09/08/2019    No results found for: PSA  No results found for: TESTOSTERONE  No results found for: HGBA1C  Urinalysis    Component Value Date/Time   COLORURINE YELLOW 06/13/2016 2050   APPEARANCEUR CLEAR 06/13/2016 2050   [place] 1.020 06/13/2016 2050   PHURINE 5.5 06/13/2016 2050   GLUCOSEU NEGATIVE 06/13/2016 2050   [place] NEGATIVE 06/13/2016 2050   [place] NEGATIVE 06/13/2016 2050   KETONESUR 15 (A) 06/13/2016 2050   PROTEINUR NEGATIVE 06/13/2016 2050   NITRITE NEGATIVE 06/13/2016 2050   LEUKOCYTESUR NEGATIVE 06/13/2016 2050    No results found for: LABMICR, [place], RBCUA, LABEPIT, MUCUS, BACTERIA  Pertinent Imaging:  No results found for this or any previous visit. No results found for this or any previous visit. No results found for this or any previous visit. No results found for this or any previous visit. No results found for this or any previous visit. No  results found for this or any previous visit. No results found for this or any previous visit. No results found for this or any previous visit.  Assessment & Plan:    1. Benign prostatic hyperplasia with urinary obstruction -we will try rapaflo 8mg . Continue finasteride  2. Nocturia -start rapaflo 8mg  daily -RTC 3 months   No follow-ups on file.  Nicolette Bang, MD  [facility] [place]

## 2019-11-19 NOTE — Patient Instructions (Signed)

## 2019-11-19 NOTE — Progress Notes (Signed)

## 2019-11-20 ENCOUNTER — Other Ambulatory Visit (INDEPENDENT_AMBULATORY_CARE_PROVIDER_SITE_OTHER): Payer: Self-pay | Admitting: *Deleted

## 2019-11-20 ENCOUNTER — Encounter (INDEPENDENT_AMBULATORY_CARE_PROVIDER_SITE_OTHER): Payer: Self-pay | Admitting: *Deleted

## 2019-11-20 NOTE — Telephone Encounter (Signed)
Patient will need appointment in 6 weeks per Dr.Rehman.

## 2019-11-20 NOTE — Telephone Encounter (Signed)
Per Dr.Rehman we will renew the proscription for the Entocort. Patient is to take 1 Imodium by mouth in the morning. Make sure he is taking the Benefiber , and also patient is not to be taking the Senokot. Office visit in 6 week on his schedule.  Daughter made aware.  Mitzie patient will need to have OV in 6 week with NUR.

## 2019-11-20 NOTE — Telephone Encounter (Signed)
Prescription was called to [facility].

## 2019-11-21 ENCOUNTER — Telehealth: Payer: Self-pay | Admitting: Urology

## 2019-11-21 NOTE — Telephone Encounter (Signed)
[employer] called  And stated medication Josem Kaufmann was denied. Pt would need to WTG:RMBOBOFPU, doxnzosine, prazosine, terazosine per [employer].

## 2019-11-24 ENCOUNTER — Other Ambulatory Visit: Payer: Self-pay

## 2019-11-24 DIAGNOSIS — N138 Other obstructive and reflux uropathy: Secondary | ICD-10-CM

## 2019-11-24 DIAGNOSIS — R351 Nocturia: Secondary | ICD-10-CM

## 2019-11-24 DIAGNOSIS — N401 Enlarged prostate with lower urinary tract symptoms: Secondary | ICD-10-CM

## 2019-11-24 MED ORDER — ALFUZOSIN HCL ER 10 MG PO TB24: 10.0000 mg | ORAL_TABLET | Freq: Every day | ORAL | 5 refills | Status: DC

## 2019-11-24 NOTE — Telephone Encounter (Signed)
Please send uroxatral 10mg  qhs. #30

## 2019-11-24 NOTE — Telephone Encounter (Signed)
Please advise which medication for pt to try.

## 2019-11-24 NOTE — Telephone Encounter (Signed)
New rx sent in

## 2019-11-26 ENCOUNTER — Telehealth: Payer: Self-pay

## 2019-11-26 NOTE — Telephone Encounter (Signed)
  Patient Consent for Virtual Visit         Jordan [name] has provided verbal consent on 11/26/2019 for a virtual visit (video or telephone).   CONSENT FOR VIRTUAL VISIT FOR:  Jordan [name]  By participating in this virtual visit I agree to the following:  I hereby voluntarily request, consent and authorize [facility] and its employed or contracted physicians, physician assistants, nurse practitioners or other licensed health care professionals (the Practitioner), to provide me with telemedicine health care services (the "Services") as deemed necessary by the treating Practitioner. I acknowledge and consent to receive the Services by the Practitioner via telemedicine. I understand that the telemedicine visit will involve communicating with the Practitioner through live audiovisual communication technology and the disclosure of certain medical information by electronic transmission. I acknowledge that I have been given the opportunity to request an in-person assessment or other available alternative prior to the telemedicine visit and am voluntarily participating in the telemedicine visit.  I understand that I have the right to withhold or withdraw my consent to the use of telemedicine in the course of my care at any time, without affecting my right to future care or treatment, and that the Practitioner or I may terminate the telemedicine visit at any time. I understand that I have the right to inspect all information obtained and/or recorded in the course of the telemedicine visit and may receive copies of available information for a reasonable fee.  I understand that some of the potential risks of receiving the Services via telemedicine include:  Marland Kitchen Delay or interruption in medical evaluation due to technological equipment failure or disruption; . Information transmitted may not be sufficient (e.g. poor resolution of images) to allow for appropriate medical decision making by the  Practitioner; and/or  . In rare instances, security protocols could fail, causing a breach of personal health information.  Furthermore, I acknowledge that it is my responsibility to provide information about my medical history, conditions and care that is complete and accurate to the best of my ability. I acknowledge that Practitioner's advice, recommendations, and/or decision may be based on factors not within their control, such as incomplete or inaccurate data provided by me or distortions of diagnostic images or specimens that may result from electronic transmissions. I understand that the practice of medicine is not an exact science and that Practitioner makes no warranties or guarantees regarding treatment outcomes. I acknowledge that a copy of this consent can be made available to me via my patient portal ([facility]), or I can request a printed copy by calling the office of [facility].    I understand that my insurance will be billed for this visit.   I have read or had this consent read to me. . I understand the contents of this consent, which adequately explains the benefits and risks of the Services being provided via telemedicine.  . I have been provided ample opportunity to ask questions regarding this consent and the Services and have had my questions answered to my satisfaction. . I give my informed consent for the services to be provided through the use of telemedicine in my medical care

## 2019-11-27 ENCOUNTER — Encounter: Payer: Self-pay | Admitting: Cardiology

## 2019-11-27 NOTE — Progress Notes (Signed)
Virtual Visit via Telephone Note   This visit type was conducted due to national recommendations for restrictions regarding the COVID-19 Pandemic (e.g. social distancing) in an effort to limit this patient's exposure and mitigate transmission in our community.  Due to his co-morbid illnesses, this patient is at least at moderate risk for complications without adequate follow up.  This format is felt to be most appropriate for this patient at this time.  The patient did not have access to video technology/had technical difficulties with video requiring transitioning to audio format only (telephone).  All issues noted in this document were discussed and addressed.  No physical exam could be performed with this format.  Please refer to the patient's chart for his  consent to telehealth for [facility].   The patient was identified using 2 identifiers.  Date:  11/28/2019   ID:  Jordan [name], DOB 1923-02-25, MRN [id number]  Patient Location: Home Provider Location: Home  PCP:  Asencion Noble, MD  Cardiologist:  Rozann Lesches, MD Electrophysiologist:  None   Evaluation Performed:  Follow-Up Visit  Chief Complaint:  Cardiac follow-up  History of Present Illness:    Jordan [name] is a 86 y.o. male that I met back in October 2018.  He is overdue for follow-up.  We spoke by phone today.  He tells me that he has been doing reasonably well from a cardiac perspective, no obvious angina symptoms.  He is no longer going to the [employer] at this point, lives in his own home in [place] and remains functional with ADLs.  He continues to follow regularly with Dr. Willey Blade and has lab work pending for later this month.  Echocardiogram from January 2020 revealed LVEF 60 to 65% with mild diastolic dysfunction, mild aortic stenosis and regurgitation, mean gradient 16 mmHg.  He follows with Dr. Alyson Ingles, history of BPH and nocturia.  I reviewed his medications which are outlined below.  Current  cardiac regimen includes aspirin, Vasotec, HCTZ, and Pravachol.  He has had the coronavirus vaccine.  Past Medical History:  Diagnosis Date  . Aortic stenosis   . Arthritis   . BPH (benign prostatic hypertrophy)   . CAD (coronary artery disease)    DES to RCA 2009  . Diverticulosis of colon   . Essential hypertension   . Fecal urgency   . History of adenomatous polyp of colon    Tubular adenomas 2016  . Hyperlipidemia   . Lower urinary tract symptoms (LUTS)   . Type 2 diabetes mellitus ([place])   . Wears glasses    Past Surgical History:  Procedure Laterality Date  . BIOPSY  10/02/2019   Procedure: BIOPSY;  Surgeon: Rogene Houston, MD;  [facility];  Service: Endoscopy;;  . CATARACT EXTRACTION W/ INTRAOCULAR LENS  IMPLANT, BILATERAL    . COLONOSCOPY  last one 11/16/2014   polpectomy  . CORONARY ANGIOPLASTY WITH STENT PLACEMENT  12-23-2007  dr hochrein   (abnormal myoview)  PCI w/ DES x1 pRCA (95%),  pCFX 25%,  mCFX 40-50%,  after PDA 30%,  prox, mid, and ostial LAD  30% calcification,  LM diffuse 25% calcification,  ef 60%  . EVALUATION UNDER ANESTHESIA WITH ANAL FISTULECTOMY [name] 06/03/2015   Procedure: ANAL EXAM UNDER ANESTHESIA ,FISTULOTOMY ;  Surgeon: [name], MD;  Location: [facility];  Service: General;  Laterality: [name];  . FLEXIBLE SIGMOIDOSCOPY [name] 10/02/2019   Procedure: FLEXIBLE SIGMOIDOSCOPY;  Surgeon: Rogene Houston, MD;  [facility];  Service: Endoscopy;  Laterality: [name];  230  . INGUINAL HERNIA REPAIR Right 2001  . TRANSTHORACIC ECHOCARDIOGRAM  12-06-2007   normal LVF, ef 60%/  mild to moderate  AV calcification without stenosis,  mild AR and TR,  mild LAE     Current Meds  Medication Sig  . Accu-Chek FastClix Lancets MISC USE 1 TO CHECK GLUCOSE ONCE DAILY  . ACCU-CHEK GUIDE test strip USE 1 STRIP TO CHECK GLUCOSE ONCE DAILY  . alfuzosin (UROXATRAL) 10 MG 24 hr tablet Take 1 tablet (10 mg total) by mouth at bedtime.    Marland Kitchen aspirin 81 MG tablet Take 1 tablet (81 mg total) by mouth every evening.  . budesonide (ENTOCORT EC) 3 MG 24 hr capsule Take 1 capsule (3 mg total) by mouth daily.  . enalapril (VASOTEC) 10 MG tablet Take 10 mg by mouth daily.  . finasteride (PROSCAR) 5 MG tablet Take 1 tablet (5 mg total) by mouth daily.  . hydrochlorothiazide (MICROZIDE) 12.5 MG capsule Take 12.5 mg by mouth daily.  . memantine (NAMENDA) 5 MG tablet Take 5 mg by mouth daily.   . metFORMIN (GLUCOPHAGE) 500 MG tablet Take 500 mg by mouth at bedtime.   . mirabegron ER (MYRBETRIQ) 25 MG TB24 tablet Take 1 tablet (25 mg total) by mouth daily.  . Multiple Vitamins-Minerals (PRESERVISION AREDS PO) Take 1 tablet by mouth daily.  . pravastatin (PRAVACHOL) 80 MG tablet Take 80 mg by mouth daily.  . timolol (TIMOPTIC) 0.5 % ophthalmic solution Apply to eye.  . Wheat Dextrin (BENEFIBER) POWD Take 4 g by mouth at bedtime.     Allergies:   Patient has no known allergies.   ROS:   No palpitations or syncope.  Prior CV studies:   The following studies were reviewed today:  Echocardiogram 07/22/2018: Study Conclusions   - Left ventricle: The cavity size was normal. There was focal basal  hypertrophy. Systolic function was normal. The estimated ejection  fraction was in the range of 60% to 65%. Wall motion was normal;  there were no regional wall motion abnormalities. Doppler  parameters are consistent with abnormal left ventricular  relaxation (grade 1 diastolic dysfunction).  - Aortic valve: Mildly calcified annulus. Trileaflet; mildly  thickened leaflets. There was mild stenosis. There was mild  regurgitation. Mean gradient (S): 16 mm Hg. Valve area (VTI):  1.61 cm^2. Valve area (Vmax): 1.57 cm^2. Valve area (Vmean): 1.61  cm^2.  - Left atrium: The atrium was mildly dilated.   Labs/Other Tests and Data Reviewed:    EKG:  An ECG dated 04/16/2017 was personally reviewed today and demonstrated:  Sinus rhythm  with left anterior fascicular block.  Recent Labs: 09/08/2019: ALT 13; BUN 23; Creat 1.09; Hemoglobin 11.9; Platelets 363; Potassium 4.7; Sodium 138; TSH 2.75    Wt Readings from Last 3 Encounters:  11/28/19 145 lb (65.8 kg)  11/19/19 140 lb (63.5 kg)  10/13/19 140 lb 6.4 oz (63.7 kg)     Objective:    Vital Signs:  BP 130/60   Ht '5\' 7"'  (1.702 m)   Wt 145 lb (65.8 kg)   BMI 22.71 kg/m    Patient spoke in full sentences, not short of breath. No audible wheezing or coughing.  ASSESSMENT & PLAN:    1.  CAD status post DES to the RCA in 2009.  He reports no angina symptoms with ADLs.  Continue observation on aspirin, Vasotec, and Pravachol.  Follow-up ECG for next office encounter.  2.  Aortic stenosis, mild by echocardiogram last January.  No clear indication for follow-up study at this time.  LVEF 60 to 65%.  3.  Essential hypertension, systolic 643 today.  Continue Vasotec and HCTZ with follow-up by Dr. Willey Blade.  4.  Mixed hyperlipidemia, he remains on Pravachol.   Time:   Today, I have spent 6 minutes with the patient with telehealth technology discussing the above problems.     Medication Adjustments/Labs and Tests Ordered: Current medicines are reviewed at length with the patient today.  Concerns regarding medicines are outlined above.   Tests Ordered: No orders of the defined types were placed in this encounter.   Medication Changes: No orders of the defined types were placed in this encounter.   Follow Up:  In Person 1 year in the [place] office.  Signed, Rozann Lesches, MD  11/28/2019 8:34 AM    [place]

## 2019-11-28 ENCOUNTER — Telehealth (INDEPENDENT_AMBULATORY_CARE_PROVIDER_SITE_OTHER): Payer: Medicare Other | Admitting: Cardiology

## 2019-11-28 ENCOUNTER — Encounter: Payer: Self-pay | Admitting: Cardiology

## 2019-11-28 VITALS — BP 130/60 | Ht 67.0 in | Wt 145.0 lb

## 2019-11-28 DIAGNOSIS — I25119 Atherosclerotic heart disease of native coronary artery with unspecified angina pectoris: Secondary | ICD-10-CM | POA: Diagnosis not present

## 2019-11-28 DIAGNOSIS — E782 Mixed hyperlipidemia: Secondary | ICD-10-CM

## 2019-11-28 DIAGNOSIS — I350 Nonrheumatic aortic (valve) stenosis: Secondary | ICD-10-CM

## 2019-11-28 DIAGNOSIS — I10 Essential (primary) hypertension: Secondary | ICD-10-CM

## 2019-11-28 NOTE — Patient Instructions (Signed)
Medication Instructions:  Your physician recommends that you continue on your current medications as directed. Please refer to the Current Medication list given to you today.  *If you need a refill on your cardiac medications before your next appointment, please call your pharmacy*   Lab Work: None today If you have labs (blood work) drawn today and your tests are completely normal, you will receive your results only by: . MyChart Message (if you have MyChart) OR . A paper copy in the mail If you have any lab test that is abnormal or we need to change your treatment, we will call you to review the results.   Testing/Procedures: None today   Follow-Up: At [facility], you and your health needs are our priority.  As part of our continuing mission to provide you with exceptional heart care, we have created designated Provider Care Teams.  These Care Teams include your primary Cardiologist (physician) and Advanced Practice Providers (APPs -  Physician Assistants and Nurse Practitioners) who all work together to provide you with the care you need, when you need it.  We recommend signing up for the patient portal called "MyChart".  Sign up information is provided on this After Visit Summary.  MyChart is used to connect with patients for Virtual Visits (Telemedicine).  Patients are able to view lab/test results, encounter notes, upcoming appointments, etc.  Non-urgent messages can be sent to your provider as well.   To learn more about what you can do with MyChart, go to [email or web address].    Your next appointment:   12 month(s)  The format for your next appointment:   In Person  Provider:   Samuel McDowell, MD   Other Instructions None       Thank you for choosing [facility] !         

## 2019-12-08 ENCOUNTER — Telehealth (INDEPENDENT_AMBULATORY_CARE_PROVIDER_SITE_OTHER): Payer: Self-pay | Admitting: Internal Medicine

## 2019-12-08 NOTE — Telephone Encounter (Signed)
Patient called stated he has been taking a series of budesonide and needs refill - also stated he is constipated and hasn't had a bowel movement in 3 days - stated he has stopped the imodium and wants to know if he should continue the budesonide - please advise - [phone]

## 2019-12-11 NOTE — Telephone Encounter (Signed)
Per Dr.Rehman - patient does not need a refill on the Budesonide. For the Constipation he may use an enema, or a suppository.He may also take a stool softener. Patient is NOT to take any laxatives.  Patient was called and made aware.

## 2019-12-11 NOTE — Telephone Encounter (Signed)
Patient had ask if he should continue the second round of the Budesonide, and questioned if this may be the reason for the Constipation.  Addressed with Dr.Rehman. He instructed that the patient should continue the Budesonide until completed. Patient was called back and made aware.

## 2019-12-22 DIAGNOSIS — E785 Hyperlipidemia, unspecified: Secondary | ICD-10-CM | POA: Diagnosis not present

## 2019-12-22 DIAGNOSIS — E1129 Type 2 diabetes mellitus with other diabetic kidney complication: Secondary | ICD-10-CM | POA: Diagnosis not present

## 2019-12-29 DIAGNOSIS — E1129 Type 2 diabetes mellitus with other diabetic kidney complication: Secondary | ICD-10-CM | POA: Diagnosis not present

## 2019-12-29 DIAGNOSIS — R413 Other amnesia: Secondary | ICD-10-CM | POA: Diagnosis not present

## 2019-12-29 DIAGNOSIS — Z6822 Body mass index (BMI) 22.0-22.9, adult: Secondary | ICD-10-CM | POA: Diagnosis not present

## 2019-12-29 DIAGNOSIS — K52839 Microscopic colitis, unspecified: Secondary | ICD-10-CM | POA: Diagnosis not present

## 2020-02-18 ENCOUNTER — Ambulatory Visit (INDEPENDENT_AMBULATORY_CARE_PROVIDER_SITE_OTHER): Payer: Medicare Other | Admitting: Urology

## 2020-02-18 ENCOUNTER — Encounter: Payer: Self-pay | Admitting: Urology

## 2020-02-18 ENCOUNTER — Other Ambulatory Visit: Payer: Self-pay

## 2020-02-18 VITALS — BP 118/59 | HR 69 | Temp 98.0°F | Ht 67.0 in | Wt 140.0 lb

## 2020-02-18 DIAGNOSIS — N138 Other obstructive and reflux uropathy: Secondary | ICD-10-CM

## 2020-02-18 DIAGNOSIS — R351 Nocturia: Secondary | ICD-10-CM | POA: Diagnosis not present

## 2020-02-18 DIAGNOSIS — N401 Enlarged prostate with lower urinary tract symptoms: Secondary | ICD-10-CM

## 2020-02-18 LAB — URINALYSIS, ROUTINE W REFLEX MICROSCOPIC
Bilirubin, UA: NEGATIVE
Glucose, UA: NEGATIVE
Leukocytes,UA: NEGATIVE
Nitrite, UA: NEGATIVE
Protein,UA: NEGATIVE
RBC, UA: NEGATIVE
Specific Gravity, UA: 1.015 (ref 1.005–1.030)
Urobilinogen, Ur: 0.2 mg/dL (ref 0.2–1.0)
pH, UA: 5.5 (ref 5.0–7.5)

## 2020-02-18 LAB — BLADDER SCAN AMB NON-IMAGING: Scan Result: 47.8

## 2020-02-18 NOTE — Progress Notes (Signed)
02/18/2020 2:46 PM   Jordan [name] 05/30/1923 [id number]  Referring provider: Asencion Noble, MD [address] [place],  [place] [address]  NOcturia  HPI: [name] is a 86yo here for followup for BPH and nocturia. Since last visit we have tried rapaflo and uroxatral without improvement in his nocturia and urinary stream. He continues to have nocturia 4x. Stream is weaker. He has urinary urgency and frequency. He has failed anticholinergic and beta3s.    PMH: Past Medical History:  Diagnosis Date  . Aortic stenosis   . Arthritis   . BPH (benign prostatic hypertrophy)   . CAD (coronary artery disease)    DES to RCA 2009  . Diverticulosis of colon   . Essential hypertension   . Fecal urgency   . History of adenomatous polyp of colon    Tubular adenomas 2016  . Hyperlipidemia   . Lower urinary tract symptoms (LUTS)   . Type 2 diabetes mellitus ([place])   . Wears glasses     Surgical History: Past Surgical History:  Procedure Laterality Date  . BIOPSY  10/02/2019   Procedure: BIOPSY;  Surgeon: Rogene Houston, MD;  [facility];  Service: Endoscopy;;  . CATARACT EXTRACTION W/ INTRAOCULAR LENS  IMPLANT, BILATERAL    . COLONOSCOPY  last one 11/16/2014   polpectomy  . CORONARY ANGIOPLASTY WITH STENT PLACEMENT  12-23-2007  dr hochrein   (abnormal myoview)  PCI w/ DES x1 pRCA (95%),  pCFX 25%,  mCFX 40-50%,  after PDA 30%,  prox, mid, and ostial LAD  30% calcification,  LM diffuse 25% calcification,  ef 60%  . EVALUATION UNDER ANESTHESIA WITH ANAL FISTULECTOMY [name] 06/03/2015   Procedure: ANAL EXAM UNDER ANESTHESIA ,FISTULOTOMY ;  Surgeon: [name], MD;  Location: [place];  Service: General;  Laterality: [name];  . FLEXIBLE SIGMOIDOSCOPY [name] 10/02/2019   Procedure: FLEXIBLE SIGMOIDOSCOPY;  Surgeon: Rogene Houston, MD;  [facility];  Service: Endoscopy;  Laterality: [name];  230  . INGUINAL HERNIA REPAIR Right 2001  .  TRANSTHORACIC ECHOCARDIOGRAM  12-06-2007   normal LVF, ef 60%/  mild to moderate  AV calcification without stenosis,  mild AR and TR,  mild LAE    Home Medications:  Allergies as of 02/18/2020   No Known Allergies     Medication List       Accurate as of February 18, 2020  2:46 PM. If you have any questions, ask your nurse or doctor.        Accu-Chek FastClix Lancets Misc USE 1 TO CHECK GLUCOSE ONCE DAILY   Accu-Chek Guide test strip Generic drug: glucose blood USE 1 STRIP TO CHECK GLUCOSE ONCE DAILY   alfuzosin 10 MG 24 hr tablet Commonly known as: UROXATRAL Take 1 tablet (10 mg total) by mouth at bedtime.   aspirin 81 MG tablet Take 1 tablet (81 mg total) by mouth every evening.   Benefiber Powd Take 4 g by mouth at bedtime.   budesonide 3 MG 24 hr capsule Commonly known as: ENTOCORT EC Take 1 capsule (3 mg total) by mouth daily.   enalapril 10 MG tablet Commonly known as: VASOTEC Take 10 mg by mouth daily.   finasteride 5 MG tablet Commonly known as: PROSCAR Take 1 tablet (5 mg total) by mouth daily.   hydrochlorothiazide 12.5 MG capsule Commonly known as: MICROZIDE Take 12.5 mg by mouth daily.   memantine 5 MG tablet Commonly known as: NAMENDA Take 5 mg  by mouth daily.   metFORMIN 500 MG tablet Commonly known as: GLUCOPHAGE Take 500 mg by mouth at bedtime.   mirabegron ER 25 MG Tb24 tablet Commonly known as: MYRBETRIQ Take 1 tablet (25 mg total) by mouth daily.   pravastatin 80 MG tablet Commonly known as: PRAVACHOL Take 80 mg by mouth daily.   PRESERVISION AREDS PO Take 1 tablet by mouth daily.   timolol 0.5 % ophthalmic solution Commonly known as: TIMOPTIC Apply to eye.       Allergies: No Known Allergies  Family History: Family History  Problem Relation Age of Onset  . Heart disease Brother   . Colon cancer Neg Hx     Social History:  reports that he quit smoking about 36 years ago. His smoking use included cigarettes. He quit  after 30.00 years of use. He has never used smokeless tobacco. He reports that he does not drink alcohol and does not use drugs.  ROS: All other review of systems were reviewed and are negative except what is noted above in HPI  Physical Exam: BP (!) 118/59   Pulse 69   Temp 98 F (36.7 C)   Ht 5\' 7"  (1.702 m)   Wt 140 lb (63.5 kg)   BMI 21.93 kg/m   Constitutional:  Alert and oriented, No acute distress. HEENT: [place] AT, moist mucus membranes.  Trachea midline, no masses. Cardiovascular: No clubbing, cyanosis, or edema. Respiratory: Normal respiratory effort, no increased work of breathing. GI: Abdomen is soft, nontender, nondistended, no abdominal masses GU: No CVA tenderness.  Lymph: No cervical or inguinal lymphadenopathy. Skin: No rashes, bruises or suspicious lesions. Neurologic: Grossly intact, no focal deficits, moving all 4 extremities. Psychiatric: Normal mood and affect.  Laboratory Data: Lab Results  Component Value Date   WBC 7.6 09/08/2019   HGB 11.9 (L) 09/08/2019   HCT 35.7 (L) 09/08/2019   MCV 89.5 09/08/2019   PLT 363 09/08/2019    Lab Results  Component Value Date   CREATININE 1.09 09/08/2019    No results found for: PSA  No results found for: TESTOSTERONE  No results found for: HGBA1C  Urinalysis    Component Value Date/Time   COLORURINE YELLOW 06/13/2016 2050   APPEARANCEUR Clear 02/18/2020 1427   LABSPEC 1.020 06/13/2016 2050   PHURINE 5.5 06/13/2016 2050   GLUCOSEU Negative 02/18/2020 [place] 06/13/2016 2050   BILIRUBINUR Negative 02/18/2020 1427   KETONESUR 15 (A) 06/13/2016 2050   PROTEINUR Negative 02/18/2020 [place] 06/13/2016 2050   NITRITE Negative 02/18/2020 1427   NITRITE NEGATIVE 06/13/2016 2050   LEUKOCYTESUR Negative 02/18/2020 1427    Lab Results  Component Value Date   LABMICR Comment 02/18/2020    Pertinent Imaging:  No results found for this or any previous visit.  No results  found for this or any previous visit.  No results found for this or any previous visit.  No results found for this or any previous visit.  No results found for this or any previous visit.  No results found for this or any previous visit.  No results found for this or any previous visit.  No results found for this or any previous visit.   Assessment & Plan:    1. Benign prostatic hyperplasia with urinary obstruction -We will schedule for UDS since he has failed both BPH and OAb therapy - BLADDER SCAN AMB NON-IMAGING - Urinalysis, Routine w reflex microscopic  2. Nocturia Continue uroxatral  No follow-ups on file.  Nicolette Bang, MD  [facility] [place]

## 2020-02-18 NOTE — Patient Instructions (Signed)

## 2020-02-18 NOTE — Progress Notes (Signed)

## 2020-03-17 DIAGNOSIS — R351 Nocturia: Secondary | ICD-10-CM | POA: Diagnosis not present

## 2020-03-17 DIAGNOSIS — R3915 Urgency of urination: Secondary | ICD-10-CM | POA: Diagnosis not present

## 2020-03-17 DIAGNOSIS — R350 Frequency of micturition: Secondary | ICD-10-CM | POA: Diagnosis not present

## 2020-03-25 DIAGNOSIS — E1129 Type 2 diabetes mellitus with other diabetic kidney complication: Secondary | ICD-10-CM | POA: Diagnosis not present

## 2020-03-26 ENCOUNTER — Encounter: Payer: Self-pay | Admitting: Emergency Medicine

## 2020-03-26 ENCOUNTER — Ambulatory Visit
Admission: EM | Admit: 2020-03-26 | Discharge: 2020-03-26 | Disposition: A | Discharge status: Home / Self Care | Payer: Medicare Other | Attending: Emergency Medicine | Admitting: Emergency Medicine

## 2020-03-26 ENCOUNTER — Other Ambulatory Visit: Payer: Self-pay

## 2020-03-26 DIAGNOSIS — S51012A Laceration without foreign body of left elbow, initial encounter: Secondary | ICD-10-CM

## 2020-03-26 DIAGNOSIS — Z23 Encounter for immunization: Secondary | ICD-10-CM

## 2020-03-26 MED ORDER — CEPHALEXIN 500 MG PO CAPS: 500.0000 mg | ORAL_CAPSULE | Freq: Four times a day (QID) | ORAL | 0 refills | Status: DC

## 2020-03-26 MED ORDER — MUPIROCIN 2 % EX OINT: 1.0000 "application " | TOPICAL_OINTMENT | Freq: Two times a day (BID) | CUTANEOUS | 0 refills | Status: DC

## 2020-03-26 MED ORDER — TETANUS-DIPHTH-ACELL PERTUSSIS 5-2.5-18.5 LF-MCG/0.5 IM SUSP
0.5000 mL | Freq: Once | INTRAMUSCULAR | Status: AC
  Administered 2020-03-26: 0.5 mL via INTRAMUSCULAR

## 2020-03-26 NOTE — Discharge Instructions (Addendum)
Bandage applied Clean with warm water and mild soap.  Avoid submerging wound in water. Change dressing daily and apply a thin layer of Bactroban.  Keflex was prescribed/ take as directed.   Take OTC ibuprofen or tylenol as needed for pain releif Return sooner or go to the ED if you have any new or worsening symptoms such as increased pain, redness, swelling, drainage, discharge, decreased range of motion of extremity, etc..

## 2020-03-26 NOTE — ED Triage Notes (Signed)
Patient states that he was walking through his door and his left arm scraped the railing, large skin tear to the left elbow.

## 2020-03-26 NOTE — ED Provider Notes (Signed)
[place]   [id number] 03/26/20 Arrival Time: 6195  CC: LACERATION  SUBJECTIVE:  Jordan [name] is a 86 y.o. male who presents to the urgent care for complaint of skin tear that occurred today.  Symptoms began after scraping his left elbow against his door.  Bleeding controlled.  Currently not on blood thinners.  Denies similar symptoms in the past.  Denies fever, chills, nausea, vomiting, redness, swelling, purulent drainage, decrease strength or sensation.   Td UTD: Unknown.  ROS: As per HPI.  All other pertinent ROS negative.     Past Medical History:  Diagnosis Date  . Aortic stenosis   . Arthritis   . BPH (benign prostatic hypertrophy)   . CAD (coronary artery disease)    DES to RCA 2009  . Diverticulosis of colon   . Essential hypertension   . Fecal urgency   . History of adenomatous polyp of colon    Tubular adenomas 2016  . Hyperlipidemia   . Lower urinary tract symptoms (LUTS)   . Type 2 diabetes mellitus ([place])   . Wears glasses    Past Surgical History:  Procedure Laterality Date  . BIOPSY  10/02/2019   Procedure: BIOPSY;  Surgeon: Rogene Houston, MD;  [facility];  Service: Endoscopy;;  . CATARACT EXTRACTION W/ INTRAOCULAR LENS  IMPLANT, BILATERAL    . COLONOSCOPY  last one 11/16/2014   polpectomy  . CORONARY ANGIOPLASTY WITH STENT PLACEMENT  12-23-2007  dr hochrein   (abnormal myoview)  PCI w/ DES x1 pRCA (95%),  pCFX 25%,  mCFX 40-50%,  after PDA 30%,  prox, mid, and ostial LAD  30% calcification,  LM diffuse 25% calcification,  ef 60%  . EVALUATION UNDER ANESTHESIA WITH ANAL FISTULECTOMY [name] 06/03/2015   Procedure: ANAL EXAM UNDER ANESTHESIA ,FISTULOTOMY ;  Surgeon: [name], MD;  Location: [place];  Service: General;  Laterality: [name];  . FLEXIBLE SIGMOIDOSCOPY [name] 10/02/2019   Procedure: FLEXIBLE SIGMOIDOSCOPY;  Surgeon: Rogene Houston, MD;  [facility];  Service: Endoscopy;  Laterality:  [name];  230  . INGUINAL HERNIA REPAIR Right 2001  . TRANSTHORACIC ECHOCARDIOGRAM  12-06-2007   normal LVF, ef 60%/  mild to moderate  AV calcification without stenosis,  mild AR and TR,  mild LAE   No Known Allergies No current facility-administered medications on file prior to encounter.   Current Outpatient Medications on File Prior to Encounter  Medication Sig Dispense Refill  . Accu-Chek FastClix Lancets MISC USE 1 TO CHECK GLUCOSE ONCE DAILY    . ACCU-CHEK GUIDE test strip USE 1 STRIP TO CHECK GLUCOSE ONCE DAILY    . alfuzosin (UROXATRAL) 10 MG 24 hr tablet Take 1 tablet (10 mg total) by mouth at bedtime. 30 tablet 5  . aspirin 81 MG tablet Take 1 tablet (81 mg total) by mouth every evening. 30 tablet   . budesonide (ENTOCORT EC) 3 MG 24 hr capsule Take 1 capsule (3 mg total) by mouth daily. 90 capsule 0  . enalapril (VASOTEC) 10 MG tablet Take 10 mg by mouth daily.    . finasteride (PROSCAR) 5 MG tablet Take 1 tablet (5 mg total) by mouth daily. 90 tablet 3  . hydrochlorothiazide (MICROZIDE) 12.5 MG capsule Take 12.5 mg by mouth daily.    . memantine (NAMENDA) 5 MG tablet Take 5 mg by mouth daily.   11  . metFORMIN (GLUCOPHAGE) 500 MG tablet Take 500 mg by mouth at bedtime.     Marland Kitchen  mirabegron ER (MYRBETRIQ) 25 MG TB24 tablet Take 1 tablet (25 mg total) by mouth daily. 30 tablet 11  . Multiple Vitamins-Minerals (PRESERVISION AREDS PO) Take 1 tablet by mouth daily.    . pravastatin (PRAVACHOL) 80 MG tablet Take 80 mg by mouth daily.    . timolol (TIMOPTIC) 0.5 % ophthalmic solution Apply to eye.    . Wheat Dextrin (BENEFIBER) POWD Take 4 g by mouth at bedtime.  0   Social History   Socioeconomic History  . Marital status: Widowed    Spouse name: Not on file  . Number of children: Not on file  . Years of education: Not on file  . Highest education level: Not on file  Occupational History  . Not on file  Tobacco Use  . Smoking status: Former Smoker    Years: 30.00    Types:  Cigarettes    Quit date: 06/01/1983    Years since quitting: 36.8  . Smokeless tobacco: Never Used  Vaping Use  . Vaping Use: Never used  Substance and Sexual Activity  . Alcohol use: No  . Drug use: No  . Sexual activity: Not on file  Other Topics Concern  . Not on file  Social History Narrative  . Not on file   Social Determinants of Health   Financial Resource Strain:   . Difficulty of Paying Living Expenses: Not on file  Food Insecurity:   . Worried About Charity fundraiser in the Last Year: Not on file  . Ran Out of Food in the Last Year: Not on file  Transportation Needs:   . Lack of Transportation (Medical): Not on file  . Lack of Transportation (Non-Medical): Not on file  Physical Activity:   . Days of Exercise per Week: Not on file  . Minutes of Exercise per Session: Not on file  Stress:   . Feeling of Stress : Not on file  Social Connections:   . Frequency of Communication with Friends and Family: Not on file  . Frequency of Social Gatherings with Friends and Family: Not on file  . Attends Religious Services: Not on file  . Active Member of Clubs or Organizations: Not on file  . Attends Archivist Meetings: Not on file  . Marital Status: Not on file  Intimate Partner Violence:   . Fear of Current or Ex-Partner: Not on file  . Emotionally Abused: Not on file  . Physically Abused: Not on file  . Sexually Abused: Not on file   Family History  Problem Relation Age of Onset  . Heart disease Brother   . Colon cancer Neg Hx      OBJECTIVE:  Vitals:   03/26/20 1919  BP: (!) 132/57  Pulse: 72  Resp: 15  Temp: 98.1 F (36.7 C)  SpO2: 98%     General appearance: alert; no distress Chest: CTA, heart sounds normal Heart: RRR, no rub, gallop or murmur Skin: Skin tear left elbow; size: approx 6 x 7 cm Psychological: alert and cooperative; normal mood and affect   Results for orders placed or performed in visit on 02/18/20  Urinalysis,  Routine w reflex microscopic  Result Value Ref Range   Specific Gravity, UA 1.015 1.005 - 1.030   pH, UA 5.5 5.0 - 7.5   Color, UA Yellow Yellow   Appearance Ur Clear Clear   Leukocytes,UA Negative Negative   Protein,UA Negative Negative/Trace   Glucose, UA Negative Negative   Ketones, UA Trace (A) Negative  RBC, UA Negative Negative   Bilirubin, UA Negative Negative   Urobilinogen, Ur 0.2 0.2 - 1.0 mg/dL   Nitrite, UA Negative Negative   Microscopic Examination Comment   BLADDER SCAN AMB NON-IMAGING  Result Value Ref Range   Scan Result 47.8     Labs Reviewed - No data to display  No results found.    ASSESSMENT & PLAN:  1. Skin tear of left elbow without complication, initial encounter     Meds ordered this encounter  Medications  . Tdap (BOOSTRIX) injection 0.5 mL  . cephALEXin (KEFLEX) 500 MG capsule    Sig: Take 1 capsule (500 mg total) by mouth 4 (four) times daily.    Dispense:  20 capsule    Refill:  0  . mupirocin ointment (BACTROBAN) 2 %    Sig: Apply 1 application topically 2 (two) times daily.    Dispense:  22 g    Refill:  0   Discharge instructions  Bandage applied Clean with warm water and mild soap.  Avoid submerging wound in water. Change dressing daily and apply a thin layer of Bactroban.  Keflex was prescribed/ take as directed.   Take OTC ibuprofen or tylenol as needed for pain releif Return sooner or go to the ED if you have any new or worsening symptoms such as increased pain, redness, swelling, drainage, discharge, decreased range of motion of extremity, etc..     Reviewed expectations re: course of current medical issues. Questions answered. Outlined signs and symptoms indicating need for more acute intervention. Patient verbalized understanding. After Visit Summary given.   Emerson Monte, FNP 03/26/20 2018

## 2020-03-29 ENCOUNTER — Other Ambulatory Visit: Payer: Self-pay

## 2020-03-29 ENCOUNTER — Ambulatory Visit (INDEPENDENT_AMBULATORY_CARE_PROVIDER_SITE_OTHER): Payer: Medicare Other | Admitting: Urology

## 2020-03-29 ENCOUNTER — Encounter: Payer: Self-pay | Admitting: Urology

## 2020-03-29 VITALS — BP 118/63 | HR 87 | Temp 97.6°F | Ht 67.0 in | Wt 140.0 lb

## 2020-03-29 DIAGNOSIS — R351 Nocturia: Secondary | ICD-10-CM | POA: Diagnosis not present

## 2020-03-29 DIAGNOSIS — N138 Other obstructive and reflux uropathy: Secondary | ICD-10-CM | POA: Diagnosis not present

## 2020-03-29 DIAGNOSIS — N401 Enlarged prostate with lower urinary tract symptoms: Secondary | ICD-10-CM

## 2020-03-29 LAB — URINALYSIS, ROUTINE W REFLEX MICROSCOPIC
Bilirubin, UA: NEGATIVE
Leukocytes,UA: NEGATIVE
Nitrite, UA: NEGATIVE
Protein,UA: NEGATIVE
RBC, UA: NEGATIVE
Specific Gravity, UA: 1.025 (ref 1.005–1.030)
Urobilinogen, Ur: 0.2 mg/dL (ref 0.2–1.0)
pH, UA: 5.5 (ref 5.0–7.5)

## 2020-03-29 MED ORDER — SILODOSIN 8 MG PO CAPS: 8.0000 mg | ORAL_CAPSULE | Freq: Every day | ORAL | 11 refills | Status: DC

## 2020-03-29 NOTE — Patient Instructions (Signed)

## 2020-03-29 NOTE — Progress Notes (Signed)
03/29/2020 8:45 AM   Jordan [name] 1922-09-12 [id number]  Referring provider: Asencion Noble, MD [address] [place],  [place] [address]  nocturia  HPI: Mr Streett is a 86yo here for followup for BPH and nocturia. He underwent UDS which showed bladder capacity 408cc, max detrusor pressure 36cm H20, flow 8cc/sec and PVR 179cc. He had an unstable detrusor contraction during the test. He has nocturia 3-4x. He has a weak stream.    PMH: Past Medical History:  Diagnosis Date  . Aortic stenosis   . Arthritis   . BPH (benign prostatic hypertrophy)   . CAD (coronary artery disease)    DES to RCA 2009  . Diverticulosis of colon   . Essential hypertension   . Fecal urgency   . History of adenomatous polyp of colon    Tubular adenomas 2016  . Hyperlipidemia   . Lower urinary tract symptoms (LUTS)   . Type 2 diabetes mellitus ([place])   . Wears glasses     Surgical History: Past Surgical History:  Procedure Laterality Date  . BIOPSY  10/02/2019   Procedure: BIOPSY;  Surgeon: Rogene Houston, MD;  [facility];  Service: Endoscopy;;  . CATARACT EXTRACTION W/ INTRAOCULAR LENS  IMPLANT, BILATERAL    . COLONOSCOPY  last one 11/16/2014   polpectomy  . CORONARY ANGIOPLASTY WITH STENT PLACEMENT  12-23-2007  dr hochrein   (abnormal myoview)  PCI w/ DES x1 pRCA (95%),  pCFX 25%,  mCFX 40-50%,  after PDA 30%,  prox, mid, and ostial LAD  30% calcification,  LM diffuse 25% calcification,  ef 60%  . EVALUATION UNDER ANESTHESIA WITH ANAL FISTULECTOMY [name] 06/03/2015   Procedure: ANAL EXAM UNDER ANESTHESIA ,FISTULOTOMY ;  Surgeon: [name], MD;  Location: [place];  Service: General;  Laterality: [name];  . FLEXIBLE SIGMOIDOSCOPY [name] 10/02/2019   Procedure: FLEXIBLE SIGMOIDOSCOPY;  Surgeon: Rogene Houston, MD;  [facility];  Service: Endoscopy;  Laterality: [name];  230  . INGUINAL HERNIA REPAIR Right 2001  . TRANSTHORACIC ECHOCARDIOGRAM   12-06-2007   normal LVF, ef 60%/  mild to moderate  AV calcification without stenosis,  mild AR and TR,  mild LAE    Home Medications:  Allergies as of 03/29/2020   No Known Allergies     Medication List       Accurate as of March 29, 2020  8:45 AM. If you have any questions, ask your nurse or doctor.        Accu-Chek FastClix Lancets Misc USE 1 TO CHECK GLUCOSE ONCE DAILY   Accu-Chek Guide test strip Generic drug: glucose blood Accu-Chek Guide test strips  USE 1 STRIP TO CHECK GLUCOSE ONCE DAILY   Accu-Chek Guide test strip Generic drug: glucose blood USE 1 STRIP TO CHECK GLUCOSE ONCE DAILY   alfuzosin 10 MG 24 hr tablet Commonly known as: UROXATRAL Take 1 tablet (10 mg total) by mouth at bedtime.   aspirin 81 MG tablet Take 1 tablet (81 mg total) by mouth every evening.   Benefiber Powd Take 4 g by mouth at bedtime.   budesonide 3 MG 24 hr capsule Commonly known as: ENTOCORT EC Take 1 capsule (3 mg total) by mouth daily.   cephALEXin 500 MG capsule Commonly known as: KEFLEX Take 1 capsule (500 mg total) by mouth 4 (four) times daily.   enalapril 10 MG tablet Commonly known as: VASOTEC Take 10 mg by mouth daily.   finasteride 5 MG  tablet Commonly known as: PROSCAR Take 1 tablet (5 mg total) by mouth daily.   hydrochlorothiazide 12.5 MG capsule Commonly known as: MICROZIDE Take 12.5 mg by mouth daily.   memantine 5 MG tablet Commonly known as: NAMENDA Take 5 mg by mouth daily.   metFORMIN 500 MG tablet Commonly known as: GLUCOPHAGE Take 500 mg by mouth at bedtime.   mirabegron ER 25 MG Tb24 tablet Commonly known as: MYRBETRIQ Take 1 tablet (25 mg total) by mouth daily.   mupirocin ointment 2 % Commonly known as: Bactroban Apply 1 application topically 2 (two) times daily.   pravastatin 80 MG tablet Commonly known as: PRAVACHOL Take 80 mg by mouth daily.   PRESERVISION AREDS PO Take 1 tablet by mouth daily.   timolol 0.5 % ophthalmic  solution Commonly known as: TIMOPTIC Apply to eye.       Allergies: No Known Allergies  Family History: Family History  Problem Relation Age of Onset  . Heart disease Brother   . Colon cancer Neg Hx     Social History:  reports that he quit smoking about 36 years ago. His smoking use included cigarettes. He quit after 30.00 years of use. He has never used smokeless tobacco. He reports that he does not drink alcohol and does not use drugs.  ROS: All other review of systems were reviewed and are negative except what is noted above in HPI  Physical Exam: Ht 5\' 7"  (1.702 m)   Wt 140 lb (63.5 kg)   BMI 21.93 kg/m   Constitutional:  Alert and oriented, No acute distress. HEENT: [place] AT, moist mucus membranes.  Trachea midline, no masses. Cardiovascular: No clubbing, cyanosis, or edema. Respiratory: Normal respiratory effort, no increased work of breathing. GI: Abdomen is soft, nontender, nondistended, no abdominal masses GU: No CVA tenderness.  Lymph: No cervical or inguinal lymphadenopathy. Skin: No rashes, bruises or suspicious lesions. Neurologic: Grossly intact, no focal deficits, moving all 4 extremities. Psychiatric: Normal mood and affect.  Laboratory Data: Lab Results  Component Value Date   WBC 7.6 09/08/2019   HGB 11.9 (L) 09/08/2019   HCT 35.7 (L) 09/08/2019   MCV 89.5 09/08/2019   PLT 363 09/08/2019    Lab Results  Component Value Date   CREATININE 1.09 09/08/2019    No results found for: PSA  No results found for: TESTOSTERONE  No results found for: HGBA1C  Urinalysis    Component Value Date/Time   COLORURINE YELLOW 06/13/2016 2050   APPEARANCEUR Clear 02/18/2020 1427   LABSPEC 1.020 06/13/2016 2050   PHURINE 5.5 06/13/2016 2050   GLUCOSEU Negative 02/18/2020 [place] 06/13/2016 2050   BILIRUBINUR Negative 02/18/2020 1427   KETONESUR 15 (A) 06/13/2016 2050   PROTEINUR Negative 02/18/2020 [place] 06/13/2016 2050     NITRITE Negative 02/18/2020 1427   NITRITE NEGATIVE 06/13/2016 2050   LEUKOCYTESUR Negative 02/18/2020 1427    Lab Results  Component Value Date   LABMICR Comment 02/18/2020    Pertinent Imaging:  No results found for this or any previous visit.  No results found for this or any previous visit.  No results found for this or any previous visit.  No results found for this or any previous visit.  No results found for this or any previous visit.  No results found for this or any previous visit.  No results found for this or any previous visit.  No results found for this or any previous visit.  Assessment & Plan:    1. Benign prostatic hyperplasia with urinary obstruction -We discussed medical therapy versus surgical interventions and the patient wishes to try rapaflo. If this fails to improve his LUTS we will proceed with cystoscopy - Urinalysis, Routine w reflex microscopic  2. Nocturia -Rapaflo 8mg  qhs   No follow-ups on file.  Nicolette Bang, MD  [facility] [place]

## 2020-03-29 NOTE — Progress Notes (Signed)
Urological Symptom Review  Patient is experiencing the following symptoms: Frequent urination Hard to postpone urination Get up at night to urinate Weak stream   Review of Systems  Gastrointestinal (upper)  : Negative for upper GI symptoms  Gastrointestinal (lower) : Negative for lower GI symptoms  Constitutional : Negative for symptoms  Skin: Negative for skin symptoms  Eyes: Negative for eye symptoms  Ear/Nose/Throat : Negative for Ear/Nose/Throat symptoms  Hematologic/Lymphatic: Negative for Hematologic/Lymphatic symptoms  Cardiovascular : Negative for cardiovascular symptoms  Respiratory : Negative for respiratory symptoms  Endocrine: Negative for endocrine symptoms  Musculoskeletal: Negative for musculoskeletal symptoms  Neurological: Negative for neurological symptoms  Psychologic: Negative for psychiatric symptoms

## 2020-04-01 DIAGNOSIS — N400 Enlarged prostate without lower urinary tract symptoms: Secondary | ICD-10-CM | POA: Diagnosis not present

## 2020-04-01 DIAGNOSIS — E1129 Type 2 diabetes mellitus with other diabetic kidney complication: Secondary | ICD-10-CM | POA: Diagnosis not present

## 2020-04-01 DIAGNOSIS — I10 Essential (primary) hypertension: Secondary | ICD-10-CM | POA: Diagnosis not present

## 2020-04-23 DIAGNOSIS — Z23 Encounter for immunization: Secondary | ICD-10-CM | POA: Diagnosis not present

## 2020-04-28 ENCOUNTER — Ambulatory Visit (INDEPENDENT_AMBULATORY_CARE_PROVIDER_SITE_OTHER): Payer: Medicare Other | Admitting: Urology

## 2020-04-28 ENCOUNTER — Other Ambulatory Visit: Payer: Self-pay

## 2020-04-28 ENCOUNTER — Encounter: Payer: Self-pay | Admitting: Urology

## 2020-04-28 VITALS — BP 133/58 | HR 81 | Temp 97.9°F | Ht 67.0 in | Wt 140.0 lb

## 2020-04-28 DIAGNOSIS — N138 Other obstructive and reflux uropathy: Secondary | ICD-10-CM | POA: Diagnosis not present

## 2020-04-28 DIAGNOSIS — N401 Enlarged prostate with lower urinary tract symptoms: Secondary | ICD-10-CM

## 2020-04-28 DIAGNOSIS — R351 Nocturia: Secondary | ICD-10-CM

## 2020-04-28 LAB — URINALYSIS, ROUTINE W REFLEX MICROSCOPIC
Bilirubin, UA: NEGATIVE
Ketones, UA: NEGATIVE
Leukocytes,UA: NEGATIVE
Nitrite, UA: NEGATIVE
RBC, UA: NEGATIVE
Specific Gravity, UA: 1.025 (ref 1.005–1.030)
Urobilinogen, Ur: 0.2 mg/dL (ref 0.2–1.0)
pH, UA: 5.5 (ref 5.0–7.5)

## 2020-04-28 MED ORDER — CIPROFLOXACIN HCL 500 MG PO TABS: 500.0000 mg | ORAL_TABLET | Freq: Once | ORAL | Status: DC

## 2020-04-28 MED ORDER — SILODOSIN 8 MG PO CAPS: 8.0000 mg | ORAL_CAPSULE | Freq: Every day | ORAL | 3 refills | Status: DC

## 2020-04-28 NOTE — Progress Notes (Signed)

## 2020-04-28 NOTE — Progress Notes (Signed)
04/28/2020 9:43 AM   Jordan [name] 19-Jan-1923 [id number]  Referring provider: Asencion Noble, MD [address] [place],  [place] [address]  Nocturia  HPI: Jordan [name] is a 86yo here for followup for BPH and nocturia. He was started on rapaflo last visit and noted improvement in his nocturia. He used to get up every 2 hours to urinate at night and now it is 3 hours.. Stream good. No other significant LUTS   PMH: Past Medical History:  Diagnosis Date  . Aortic stenosis   . Arthritis   . BPH (benign prostatic hypertrophy)   . CAD (coronary artery disease)    DES to RCA 2009  . Diverticulosis of colon   . Essential hypertension   . Fecal urgency   . History of adenomatous polyp of colon    Tubular adenomas 2016  . Hyperlipidemia   . Lower urinary tract symptoms (LUTS)   . Type 2 diabetes mellitus ([place])   . Wears glasses     Surgical History: Past Surgical History:  Procedure Laterality Date  . BIOPSY  10/02/2019   Procedure: BIOPSY;  Surgeon: Rogene Houston, MD;  [facility];  Service: Endoscopy;;  . CATARACT EXTRACTION W/ INTRAOCULAR LENS  IMPLANT, BILATERAL    . COLONOSCOPY  last one 11/16/2014   polpectomy  . CORONARY ANGIOPLASTY WITH STENT PLACEMENT  12-23-2007  dr hochrein   (abnormal myoview)  PCI w/ DES x1 pRCA (95%),  pCFX 25%,  mCFX 40-50%,  after PDA 30%,  prox, mid, and ostial LAD  30% calcification,  LM diffuse 25% calcification,  ef 60%  . EVALUATION UNDER ANESTHESIA WITH ANAL FISTULECTOMY [name] 06/03/2015   Procedure: ANAL EXAM UNDER ANESTHESIA ,FISTULOTOMY ;  Surgeon: [name], MD;  Location: [facility];  Service: General;  Laterality: [name];  . FLEXIBLE SIGMOIDOSCOPY [name] 10/02/2019   Procedure: FLEXIBLE SIGMOIDOSCOPY;  Surgeon: Rogene Houston, MD;  [facility];  Service: Endoscopy;  Laterality: [name];  230  . INGUINAL HERNIA REPAIR Right 2001  . TRANSTHORACIC ECHOCARDIOGRAM  12-06-2007   normal LVF,  ef 60%/  mild to moderate  AV calcification without stenosis,  mild AR and TR,  mild LAE    Home Medications:  Allergies as of 04/28/2020   No Known Allergies     Medication List       Accurate as of April 28, 2020  9:43 AM. If you have any questions, ask your nurse or doctor.        Accu-Chek FastClix Lancets Misc USE 1 TO CHECK GLUCOSE ONCE DAILY   Accu-Chek Guide test strip Generic drug: glucose blood Accu-Chek Guide test strips  USE 1 STRIP TO CHECK GLUCOSE ONCE DAILY   Accu-Chek Guide test strip Generic drug: glucose blood USE 1 STRIP TO CHECK GLUCOSE ONCE DAILY   alfuzosin 10 MG 24 hr tablet Commonly known as: UROXATRAL Take 1 tablet (10 mg total) by mouth at bedtime.   aspirin 81 MG tablet Take 1 tablet (81 mg total) by mouth every evening.   Benefiber Powd Take 4 g by mouth at bedtime.   budesonide 3 MG 24 hr capsule Commonly known as: ENTOCORT EC Take 1 capsule (3 mg total) by mouth daily.   cephALEXin 500 MG capsule Commonly known as: KEFLEX Take 1 capsule (500 mg total) by mouth 4 (four) times daily.   enalapril 10 MG tablet Commonly known as: VASOTEC Take 10 mg by mouth daily.   finasteride 5 MG  tablet Commonly known as: PROSCAR Take 1 tablet (5 mg total) by mouth daily.   hydrochlorothiazide 12.5 MG capsule Commonly known as: MICROZIDE Take 12.5 mg by mouth daily.   memantine 5 MG tablet Commonly known as: NAMENDA Take 5 mg by mouth daily.   metFORMIN 500 MG tablet Commonly known as: GLUCOPHAGE Take 500 mg by mouth at bedtime.   mirabegron ER 25 MG Tb24 tablet Commonly known as: MYRBETRIQ Take 1 tablet (25 mg total) by mouth daily.   mupirocin ointment 2 % Commonly known as: Bactroban Apply 1 application topically 2 (two) times daily.   pravastatin 80 MG tablet Commonly known as: PRAVACHOL Take 80 mg by mouth daily.   PRESERVISION AREDS PO Take 1 tablet by mouth daily.   multivitamin-lutein Caps capsule Take 1 capsule by  mouth daily.   silodosin 8 MG Caps capsule Commonly known as: RAPAFLO Take 1 capsule (8 mg total) by mouth daily with breakfast.   timolol 0.5 % ophthalmic solution Commonly known as: TIMOPTIC Apply to eye.       Allergies: No Known Allergies  Family History: Family History  Problem Relation Age of Onset  . Heart disease Brother   . Colon cancer Neg Hx     Social History:  reports that he quit smoking about 36 years ago. His smoking use included cigarettes. He quit after 30.00 years of use. He has never used smokeless tobacco. He reports that he does not drink alcohol and does not use drugs.  ROS: All other review of systems were reviewed and are negative except what is noted above in HPI  Physical Exam: BP (!) 133/58   Pulse 81   Temp 97.9 F (36.6 C)   Ht 5\' 7"  (1.702 m)   Wt 140 lb (63.5 kg)   BMI 21.93 kg/m   Constitutional:  Alert and oriented, No acute distress. HEENT: [place] AT, moist mucus membranes.  Trachea midline, no masses. Cardiovascular: No clubbing, cyanosis, or edema. Respiratory: Normal respiratory effort, no increased work of breathing. GI: Abdomen is soft, nontender, nondistended, no abdominal masses GU: No CVA tenderness.  Lymph: No cervical or inguinal lymphadenopathy. Skin: No rashes, bruises or suspicious lesions. Neurologic: Grossly intact, no focal deficits, moving all 4 extremities. Psychiatric: Normal mood and affect.  Laboratory Data: Lab Results  Component Value Date   WBC 7.6 09/08/2019   HGB 11.9 (L) 09/08/2019   HCT 35.7 (L) 09/08/2019   MCV 89.5 09/08/2019   PLT 363 09/08/2019    Lab Results  Component Value Date   CREATININE 1.09 09/08/2019    No results found for: PSA  No results found for: TESTOSTERONE  No results found for: HGBA1C  Urinalysis    Component Value Date/Time   COLORURINE YELLOW 06/13/2016 2050   APPEARANCEUR Clear 03/29/2020 1053   LABSPEC 1.020 06/13/2016 2050   PHURINE 5.5 06/13/2016 2050    GLUCOSEU 1+ (A) 03/29/2020 1053   HGBUR NEGATIVE 06/13/2016 2050   BILIRUBINUR Negative 03/29/2020 1053   KETONESUR 15 (A) 06/13/2016 2050   PROTEINUR Negative 03/29/2020 1053   PROTEINUR NEGATIVE 06/13/2016 2050   NITRITE Negative 03/29/2020 1053   NITRITE NEGATIVE 06/13/2016 2050   LEUKOCYTESUR Negative 03/29/2020 1053    Lab Results  Component Value Date   LABMICR Comment 03/29/2020    Pertinent Imaging:  No results found for this or any previous visit.  No results found for this or any previous visit.  No results found for this or any previous visit.  No  results found for this or any previous visit.  No results found for this or any previous visit.  No results found for this or any previous visit.  No results found for this or any previous visit.  No results found for this or any previous visit.   Assessment & Plan:    1. Benign prostatic hyperplasia with urinary obstruction -continue rapaflo 8mg  - Urinalysis, Routine w reflex microscopic - ciprofloxacin (CIPRO) tablet 500 mg  2. Nocturia Continue rapaflo 8mg    We discussed cystoscopy and the patient defers at this time   No follow-ups on file.  Nicolette Bang, MD  [facility] [place]

## 2020-04-28 NOTE — Patient Instructions (Signed)

## 2020-06-07 DIAGNOSIS — L570 Actinic keratosis: Secondary | ICD-10-CM | POA: Diagnosis not present

## 2020-06-07 DIAGNOSIS — D0462 Carcinoma in situ of skin of left upper limb, including shoulder: Secondary | ICD-10-CM | POA: Diagnosis not present

## 2020-06-07 DIAGNOSIS — D225 Melanocytic nevi of trunk: Secondary | ICD-10-CM | POA: Diagnosis not present

## 2020-06-07 DIAGNOSIS — X32XXXD Exposure to sunlight, subsequent encounter: Secondary | ICD-10-CM | POA: Diagnosis not present

## 2020-06-07 DIAGNOSIS — L821 Other seborrheic keratosis: Secondary | ICD-10-CM | POA: Diagnosis not present

## 2020-07-12 DIAGNOSIS — Z08 Encounter for follow-up examination after completed treatment for malignant neoplasm: Secondary | ICD-10-CM | POA: Diagnosis not present

## 2020-07-12 DIAGNOSIS — D034 Melanoma in situ of scalp and neck: Secondary | ICD-10-CM | POA: Diagnosis not present

## 2020-07-12 DIAGNOSIS — Z85820 Personal history of malignant melanoma of skin: Secondary | ICD-10-CM | POA: Diagnosis not present

## 2020-07-12 DIAGNOSIS — B078 Other viral warts: Secondary | ICD-10-CM | POA: Diagnosis not present

## 2020-07-27 ENCOUNTER — Ambulatory Visit (INDEPENDENT_AMBULATORY_CARE_PROVIDER_SITE_OTHER): Payer: Medicare Other | Admitting: Urology

## 2020-07-27 ENCOUNTER — Encounter: Payer: Self-pay | Admitting: Urology

## 2020-07-27 ENCOUNTER — Other Ambulatory Visit: Payer: Self-pay

## 2020-07-27 VITALS — BP 120/49 | HR 74 | Temp 98.3°F

## 2020-07-27 DIAGNOSIS — R351 Nocturia: Secondary | ICD-10-CM | POA: Diagnosis not present

## 2020-07-27 DIAGNOSIS — N401 Enlarged prostate with lower urinary tract symptoms: Secondary | ICD-10-CM

## 2020-07-27 DIAGNOSIS — N138 Other obstructive and reflux uropathy: Secondary | ICD-10-CM

## 2020-07-27 LAB — URINALYSIS, ROUTINE W REFLEX MICROSCOPIC
Bilirubin, UA: NEGATIVE
Ketones, UA: NEGATIVE
Leukocytes,UA: NEGATIVE
Nitrite, UA: NEGATIVE
Protein,UA: NEGATIVE
Specific Gravity, UA: 1.015 (ref 1.005–1.030)
Urobilinogen, Ur: 0.2 mg/dL (ref 0.2–1.0)
pH, UA: 5 (ref 5.0–7.5)

## 2020-07-27 LAB — MICROSCOPIC EXAMINATION
Bacteria, UA: NONE SEEN
Epithelial Cells (non renal): NONE SEEN /hpf (ref 0–10)
Renal Epithel, UA: NONE SEEN /hpf
WBC, UA: NONE SEEN /hpf (ref 0–5)

## 2020-07-27 LAB — BLADDER SCAN AMB NON-IMAGING: Scan Result: 52

## 2020-07-27 MED ORDER — SILODOSIN 8 MG PO CAPS: 8.0000 mg | ORAL_CAPSULE | Freq: Two times a day (BID) | ORAL | 3 refills | Status: DC

## 2020-07-27 NOTE — Patient Instructions (Signed)

## 2020-07-27 NOTE — Progress Notes (Signed)
07/27/2020 3:51 PM   Jordan [name] March 24, 1923 [id number]  Referring provider: Asencion Noble, MD [address] [place],  [place] [address]  Urinary frequency  HPI: Jordan [name] is a 87yo here for followup for BPh with urinary frequency and nocturia. He remains on rapaflo 8mg  daily. He has urinary frequency every 2 hours. He has nocturia 3-4x which is stable. He denies any other worsening LUTS. Overall he is unhappy with his urination.    PMH: Past Medical History:  Diagnosis Date  . Aortic stenosis   . Arthritis   . BPH (benign prostatic hypertrophy)   . CAD (coronary artery disease)    DES to RCA 2009  . Diverticulosis of colon   . Essential hypertension   . Fecal urgency   . History of adenomatous polyp of colon    Tubular adenomas 2016  . Hyperlipidemia   . Lower urinary tract symptoms (LUTS)   . Type 2 diabetes mellitus ([place])   . Wears glasses     Surgical History: Past Surgical History:  Procedure Laterality Date  . BIOPSY  10/02/2019   Procedure: BIOPSY;  Surgeon: Rogene Houston, MD;  [facility];  Service: Endoscopy;;  . CATARACT EXTRACTION W/ INTRAOCULAR LENS  IMPLANT, BILATERAL    . COLONOSCOPY  last one 11/16/2014   polpectomy  . CORONARY ANGIOPLASTY WITH STENT PLACEMENT  12-23-2007  dr hochrein   (abnormal myoview)  PCI w/ DES x1 pRCA (95%),  pCFX 25%,  mCFX 40-50%,  after PDA 30%,  prox, mid, and ostial LAD  30% calcification,  LM diffuse 25% calcification,  ef 60%  . EVALUATION UNDER ANESTHESIA WITH ANAL FISTULECTOMY [name] 06/03/2015   Procedure: ANAL EXAM UNDER ANESTHESIA ,FISTULOTOMY ;  Surgeon: [name], MD;  Location: [facility];  Service: General;  Laterality: [name];  . FLEXIBLE SIGMOIDOSCOPY [name] 10/02/2019   Procedure: FLEXIBLE SIGMOIDOSCOPY;  Surgeon: Rogene Houston, MD;  [facility];  Service: Endoscopy;  Laterality: [name];  230  . INGUINAL HERNIA REPAIR Right 2001  . TRANSTHORACIC  ECHOCARDIOGRAM  12-06-2007   normal LVF, ef 60%/  mild to moderate  AV calcification without stenosis,  mild AR and TR,  mild LAE    Home Medications:  Allergies as of 07/27/2020   No Known Allergies     Medication List       Accurate as of July 27, 2020  3:51 PM. If you have any questions, ask your nurse or doctor.        Accu-Chek FastClix Lancets Misc USE 1 TO CHECK GLUCOSE ONCE DAILY   Accu-Chek Guide test strip Generic drug: glucose blood Accu-Chek Guide test strips  USE 1 STRIP TO CHECK GLUCOSE ONCE DAILY   Accu-Chek Guide test strip Generic drug: glucose blood USE 1 STRIP TO CHECK GLUCOSE ONCE DAILY   alfuzosin 10 MG 24 hr tablet Commonly known as: UROXATRAL Take 1 tablet (10 mg total) by mouth at bedtime.   aspirin 81 MG tablet Take 1 tablet (81 mg total) by mouth every evening.   Benefiber Powd Take 4 g by mouth at bedtime.   budesonide 3 MG 24 hr capsule Commonly known as: ENTOCORT EC Take 1 capsule (3 mg total) by mouth daily.   cephALEXin 500 MG capsule Commonly known as: KEFLEX Take 1 capsule (500 mg total) by mouth 4 (four) times daily.   enalapril 10 MG tablet Commonly known as: VASOTEC Take 10 mg by mouth daily.   finasteride 5  MG tablet Commonly known as: PROSCAR Take 1 tablet (5 mg total) by mouth daily.   hydrochlorothiazide 12.5 MG capsule Commonly known as: MICROZIDE Take 12.5 mg by mouth daily.   memantine 5 MG tablet Commonly known as: NAMENDA Take 5 mg by mouth daily.   metFORMIN 500 MG tablet Commonly known as: GLUCOPHAGE Take 500 mg by mouth at bedtime.   mirabegron ER 25 MG Tb24 tablet Commonly known as: MYRBETRIQ Take 1 tablet (25 mg total) by mouth daily.   mupirocin ointment 2 % Commonly known as: Bactroban Apply 1 application topically 2 (two) times daily.   pravastatin 80 MG tablet Commonly known as: PRAVACHOL Take 80 mg by mouth daily.   PRESERVISION AREDS PO Take 1 tablet by mouth daily.    multivitamin-lutein Caps capsule Take 1 capsule by mouth daily.   silodosin 8 MG Caps capsule Commonly known as: RAPAFLO Take 1 capsule (8 mg total) by mouth daily with breakfast.   timolol 0.5 % ophthalmic solution Commonly known as: TIMOPTIC Apply to eye.       Allergies: No Known Allergies  Family History: Family History  Problem Relation Age of Onset  . Heart disease Brother   . Colon cancer Neg Hx     Social History:  reports that he quit smoking about 37 years ago. His smoking use included cigarettes. He quit after 30.00 years of use. He has never used smokeless tobacco. He reports that he does not drink alcohol and does not use drugs.  ROS: All other review of systems were reviewed and are negative except what is noted above in HPI  Physical Exam: BP (!) 120/49   Pulse 74   Temp 98.3 F (36.8 C)   Constitutional:  Alert and oriented, No acute distress. HEENT: [place] AT, moist mucus membranes.  Trachea midline, no masses. Cardiovascular: No clubbing, cyanosis, or edema. Respiratory: Normal respiratory effort, no increased work of breathing. GI: Abdomen is soft, nontender, nondistended, no abdominal masses GU: No CVA tenderness.  Lymph: No cervical or inguinal lymphadenopathy. Skin: No rashes, bruises or suspicious lesions. Neurologic: Grossly intact, no focal deficits, moving all 4 extremities. Psychiatric: Normal mood and affect.  Laboratory Data: Lab Results  Component Value Date   WBC 7.6 09/08/2019   HGB 11.9 (L) 09/08/2019   HCT 35.7 (L) 09/08/2019   MCV 89.5 09/08/2019   PLT 363 09/08/2019    Lab Results  Component Value Date   CREATININE 1.09 09/08/2019    No results found for: PSA  No results found for: TESTOSTERONE  No results found for: HGBA1C  Urinalysis    Component Value Date/Time   COLORURINE YELLOW 06/13/2016 2050   APPEARANCEUR Clear 04/28/2020 0939   LABSPEC 1.020 06/13/2016 2050   PHURINE 5.5 06/13/2016 2050   GLUCOSEU  Trace (A) 04/28/2020 0939   HGBUR NEGATIVE 06/13/2016 2050   BILIRUBINUR Negative 04/28/2020 0939   KETONESUR 15 (A) 06/13/2016 2050   PROTEINUR Trace (A) 04/28/2020 0939   PROTEINUR NEGATIVE 06/13/2016 2050   NITRITE Negative 04/28/2020 0939   NITRITE NEGATIVE 06/13/2016 2050   LEUKOCYTESUR Negative 04/28/2020 0939    Lab Results  Component Value Date   LABMICR Comment 04/28/2020    Pertinent Imaging:  No results found for this or any previous visit.  No results found for this or any previous visit.  No results found for this or any previous visit.  No results found for this or any previous visit.  No results found for this or any previous  visit.  No results found for this or any previous visit.  No results found for this or any previous visit.  No results found for this or any previous visit.   Assessment & Plan:    1. Benign prostatic hyperplasia with urinary obstruction -increase rapaflo to 8mg  BID. If he fails to improve his LUTS we will proceed with cystoscopy - Urinalysis, Routine w reflex microscopic - Bladder Scan (Post Void Residual) in office  2. Nocturia Increase rapaflo to 8mg  BID   No follow-ups on file.  Nicolette Bang, MD  [facility] [place]

## 2020-07-27 NOTE — Progress Notes (Signed)
Bladder Scan Patient can void: 52 ml Performed By: Kirra Verga,lpn    Urological Symptom Review  Patient is experiencing the following symptoms: Get up at night to urinate  Weak stream   Review of Systems  Gastrointestinal (upper)  : negative  Gastrointestinal (lower) : Negative for lower GI symptoms  Constitutional : Negative for symptoms  Skin: Itching  Eyes: Negative for eye symptoms  Ear/Nose/Throat : Negative for Ear/Nose/Throat symptoms  Hematologic/Lymphatic: Negative for Hematologic/Lymphatic symptoms  Cardiovascular : Negative for cardiovascular symptoms  Respiratory : Negative for respiratory symptoms  Endocrine: Negative for endocrine symptoms  Musculoskeletal: Negative for musculoskeletal symptoms  Neurological: Negative for neurological symptoms  Psychologic: Negative for psychiatric symptoms

## 2020-07-29 DIAGNOSIS — D034 Melanoma in situ of scalp and neck: Secondary | ICD-10-CM | POA: Diagnosis not present

## 2020-07-29 DIAGNOSIS — L98499 Non-pressure chronic ulcer of skin of other sites with unspecified severity: Secondary | ICD-10-CM | POA: Diagnosis not present

## 2020-09-28 DIAGNOSIS — E1129 Type 2 diabetes mellitus with other diabetic kidney complication: Secondary | ICD-10-CM | POA: Diagnosis not present

## 2020-10-05 DIAGNOSIS — R7309 Other abnormal glucose: Secondary | ICD-10-CM | POA: Diagnosis not present

## 2020-10-05 DIAGNOSIS — I10 Essential (primary) hypertension: Secondary | ICD-10-CM | POA: Diagnosis not present

## 2020-10-05 DIAGNOSIS — E1122 Type 2 diabetes mellitus with diabetic chronic kidney disease: Secondary | ICD-10-CM | POA: Diagnosis not present

## 2020-10-05 DIAGNOSIS — N401 Enlarged prostate with lower urinary tract symptoms: Secondary | ICD-10-CM | POA: Diagnosis not present

## 2020-10-14 DIAGNOSIS — I10 Essential (primary) hypertension: Secondary | ICD-10-CM | POA: Diagnosis not present

## 2020-10-14 DIAGNOSIS — E785 Hyperlipidemia, unspecified: Secondary | ICD-10-CM | POA: Diagnosis not present

## 2020-10-14 DIAGNOSIS — E119 Type 2 diabetes mellitus without complications: Secondary | ICD-10-CM | POA: Diagnosis not present

## 2020-10-15 ENCOUNTER — Telehealth (INDEPENDENT_AMBULATORY_CARE_PROVIDER_SITE_OTHER): Payer: Self-pay | Admitting: Internal Medicine

## 2020-10-15 NOTE — Telephone Encounter (Signed)
Patient called the office stated he has had loose bowels for about 5 days - is there anything that can be called into his pharmacy - please advise - [phone]

## 2020-10-15 NOTE — Telephone Encounter (Signed)
I called and left a detailed message that Tammy had called in the medication Budesonide to [place] and gave instructions on how to take. Also when he gets down to the 3 mg call the office with a progress report. He also may take imodium 2 mg daily but make sure he does not become constipated with taking. I asked that he please call back to the office to confirm he did receive this message.

## 2020-10-15 NOTE — Telephone Encounter (Signed)
Per Dr.Rehman the patient may take the Budesonide 9 mg by mouth for 2 weeks, 6 mg by mouth for 2 weeks , 3 mg by mouth for 2 week. He may also take the Imodium 2 mg by mouth daily. Want to make sure he does not become impacted. Per Dr. Laural Golden when patient gets down to the 3 mg he is to call the office with a progress report.  Prescription called to the patient's pharmacy and patient made aware.

## 2020-10-18 NOTE — Telephone Encounter (Signed)
Patient dropped by the office and I explained to him the directions. He states he noticed his symptoms had began right after he started taking Boost. He decided on Last Thursday 10/14/2020 to stop them. Wants to know if the boost could have caused his symptoms. Would like to know if he needs to resume the boost.

## 2020-10-18 NOTE — Telephone Encounter (Signed)
I called and left a vm with family asked that the patient please call the office.

## 2020-10-18 NOTE — Telephone Encounter (Signed)
Patient returned your call.

## 2020-10-19 NOTE — Telephone Encounter (Signed)
Per Dr. Laural Golden yes, the boost can cause loose stools. Jordan [name] may drink the boost,but Jordan [name] will need to sip on it over two hours. Patient aware of all.

## 2020-10-28 DIAGNOSIS — D225 Melanocytic nevi of trunk: Secondary | ICD-10-CM | POA: Diagnosis not present

## 2020-10-28 DIAGNOSIS — Z1283 Encounter for screening for malignant neoplasm of skin: Secondary | ICD-10-CM | POA: Diagnosis not present

## 2020-10-28 DIAGNOSIS — Z85820 Personal history of malignant melanoma of skin: Secondary | ICD-10-CM | POA: Diagnosis not present

## 2020-10-28 DIAGNOSIS — Z08 Encounter for follow-up examination after completed treatment for malignant neoplasm: Secondary | ICD-10-CM | POA: Diagnosis not present

## 2020-11-01 ENCOUNTER — Other Ambulatory Visit: Payer: Self-pay | Admitting: Urology

## 2020-11-03 ENCOUNTER — Encounter: Payer: Self-pay | Admitting: Urology

## 2020-11-03 ENCOUNTER — Ambulatory Visit (INDEPENDENT_AMBULATORY_CARE_PROVIDER_SITE_OTHER): Payer: Medicare Other | Admitting: Urology

## 2020-11-03 ENCOUNTER — Other Ambulatory Visit: Payer: Self-pay

## 2020-11-03 DIAGNOSIS — N138 Other obstructive and reflux uropathy: Secondary | ICD-10-CM | POA: Diagnosis not present

## 2020-11-03 DIAGNOSIS — N401 Enlarged prostate with lower urinary tract symptoms: Secondary | ICD-10-CM

## 2020-11-03 DIAGNOSIS — R351 Nocturia: Secondary | ICD-10-CM | POA: Diagnosis not present

## 2020-11-03 LAB — URINALYSIS, ROUTINE W REFLEX MICROSCOPIC
Bilirubin, UA: NEGATIVE
Glucose, UA: NEGATIVE
Ketones, UA: NEGATIVE
Leukocytes,UA: NEGATIVE
Nitrite, UA: NEGATIVE
Protein,UA: NEGATIVE
Specific Gravity, UA: 1.015 (ref 1.005–1.030)
Urobilinogen, Ur: 0.2 mg/dL (ref 0.2–1.0)
pH, UA: 5.5 (ref 5.0–7.5)

## 2020-11-03 LAB — BLADDER SCAN AMB NON-IMAGING: Scan Result: 60

## 2020-11-03 MED ORDER — FINASTERIDE 5 MG PO TABS: 5.0000 mg | ORAL_TABLET | Freq: Every day | ORAL | 0 refills | Status: DC

## 2020-11-03 MED ORDER — SILODOSIN 8 MG PO CAPS: 8.0000 mg | ORAL_CAPSULE | Freq: Two times a day (BID) | ORAL | 3 refills | Status: DC

## 2020-11-03 NOTE — Patient Instructions (Signed)

## 2020-11-03 NOTE — Progress Notes (Signed)
post void residual=60  Urological Symptom Review  Patient is experiencing the following symptoms: none   Review of Systems  Gastrointestinal (upper)  : Negative for upper GI symptoms  Gastrointestinal (lower) : Negative for lower GI symptoms  Constitutional : Negative for symptoms  Skin: Negative for skin symptoms  Eyes: Negative for eye symptoms  Ear/Nose/Throat : negative  Hematologic/Lymphatic: Negative for Hematologic/Lymphatic symptoms  Cardiovascular : Negative for cardiovascular symptoms  Respiratory : Negative for respiratory symptoms  Endocrine: Negative for endocrine symptoms  Musculoskeletal: Negative for musculoskeletal symptoms  Neurological: Negative for neurological symptoms  Psychologic: Negative for psychiatric symptoms

## 2020-11-03 NOTE — Progress Notes (Signed)
11/03/2020 3:11 PM   Jordan [name] 1923-05-07 [id number]  Referring provider: Asencion Noble, MD [address] [place],  [place] [address]  followup nocturia  HPI: Jordan [name] is a 87yo here for followup for BPh and nocturia. Nocturia improved to 3x on rapaflo 8mg  BID. Urinary frequency every 3 hours. Overall he has improved with the increase rapaflo to BID. He does not wish to pursue surgery at this time.    PMH: Past Medical History:  Diagnosis Date  . Aortic stenosis   . Arthritis   . BPH (benign prostatic hypertrophy)   . CAD (coronary artery disease)    DES to RCA 2009  . Diverticulosis of colon   . Essential hypertension   . Fecal urgency   . History of adenomatous polyp of colon    Tubular adenomas 2016  . Hyperlipidemia   . Lower urinary tract symptoms (LUTS)   . Type 2 diabetes mellitus ([place])   . Wears glasses     Surgical History: Past Surgical History:  Procedure Laterality Date  . BIOPSY  10/02/2019   Procedure: BIOPSY;  Surgeon: Rogene Houston, MD;  [facility];  Service: Endoscopy;;  . CATARACT EXTRACTION W/ INTRAOCULAR LENS  IMPLANT, BILATERAL    . COLONOSCOPY  last one 11/16/2014   polpectomy  . CORONARY ANGIOPLASTY WITH STENT PLACEMENT  12-23-2007  dr hochrein   (abnormal myoview)  PCI w/ DES x1 pRCA (95%),  pCFX 25%,  mCFX 40-50%,  after PDA 30%,  prox, mid, and ostial LAD  30% calcification,  LM diffuse 25% calcification,  ef 60%  . EVALUATION UNDER ANESTHESIA WITH ANAL FISTULECTOMY [name] 06/03/2015   Procedure: ANAL EXAM UNDER ANESTHESIA ,FISTULOTOMY ;  Surgeon: [name], MD;  Location: [place];  Service: General;  Laterality: [name];  . FLEXIBLE SIGMOIDOSCOPY [name] 10/02/2019   Procedure: FLEXIBLE SIGMOIDOSCOPY;  Surgeon: Rogene Houston, MD;  [facility];  Service: Endoscopy;  Laterality: [name];  230  . INGUINAL HERNIA REPAIR Right 2001  . TRANSTHORACIC ECHOCARDIOGRAM  12-06-2007   normal LVF,  ef 60%/  mild to moderate  AV calcification without stenosis,  mild AR and TR,  mild LAE    Home Medications:  Allergies as of 11/03/2020   No Known Allergies     Medication List       Accurate as of November 03, 2020  3:11 PM. If you have any questions, ask your nurse or doctor.        STOP taking these medications   budesonide 3 MG 24 hr capsule Commonly known as: ENTOCORT EC Stopped by: Nicolette Bang, MD   cephALEXin 500 MG capsule Commonly known as: KEFLEX Stopped by: Nicolette Bang, MD     TAKE these medications   Accu-Chek FastClix Lancets Misc USE 1 TO CHECK GLUCOSE ONCE DAILY   Accu-Chek Guide test strip Generic drug: glucose blood Accu-Chek Guide test strips  USE 1 STRIP TO CHECK GLUCOSE ONCE DAILY   Accu-Chek Guide test strip Generic drug: glucose blood USE 1 STRIP TO CHECK GLUCOSE ONCE DAILY   alfuzosin 10 MG 24 hr tablet Commonly known as: UROXATRAL Take 1 tablet (10 mg total) by mouth at bedtime.   aspirin 81 MG tablet Take 1 tablet (81 mg total) by mouth every evening.   Benefiber Powd Take 4 g by mouth at bedtime.   enalapril 10 MG tablet Commonly known as: VASOTEC Take 10 mg by mouth daily.   finasteride 5 MG  tablet Commonly known as: PROSCAR TAKE 1 TABLET BY MOUTH DAILY AS DIRECTED.   hydrochlorothiazide 12.5 MG capsule Commonly known as: MICROZIDE Take 12.5 mg by mouth daily.   memantine 5 MG tablet Commonly known as: NAMENDA Take 5 mg by mouth daily.   metFORMIN 500 MG tablet Commonly known as: GLUCOPHAGE Take 500 mg by mouth at bedtime.   mirabegron ER 25 MG Tb24 tablet Commonly known as: MYRBETRIQ Take 1 tablet (25 mg total) by mouth daily.   mupirocin ointment 2 % Commonly known as: Bactroban Apply 1 application topically 2 (two) times daily.   pravastatin 80 MG tablet Commonly known as: PRAVACHOL Take 80 mg by mouth daily.   PRESERVISION AREDS PO Take 1 tablet by mouth daily.   multivitamin-lutein Caps  capsule Take 1 capsule by mouth daily.   silodosin 8 MG Caps capsule Commonly known as: RAPAFLO Take 1 capsule (8 mg total) by mouth in the morning and at bedtime.   timolol 0.5 % ophthalmic solution Commonly known as: TIMOPTIC Apply to eye.       Allergies: No Known Allergies  Family History: Family History  Problem Relation Age of Onset  . Heart disease Brother   . Colon cancer Neg Hx     Social History:  reports that he quit smoking about 37 years ago. His smoking use included cigarettes. He quit after 30.00 years of use. He has never used smokeless tobacco. He reports that he does not drink alcohol and does not use drugs.  ROS: All other review of systems were reviewed and are negative except what is noted above in HPI  Physical Exam: There were no vitals taken for this visit.  Constitutional:  Alert and oriented, No acute distress. HEENT:  AT, moist mucus membranes.  Trachea midline, no masses. Cardiovascular: No clubbing, cyanosis, or edema. Respiratory: Normal respiratory effort, no increased work of breathing. GI: Abdomen is soft, nontender, nondistended, no abdominal masses GU: No CVA tenderness.  Lymph: No cervical or inguinal lymphadenopathy. Skin: No rashes, bruises or suspicious lesions. Neurologic: Grossly intact, no focal deficits, moving all 4 extremities. Psychiatric: Normal mood and affect.  Laboratory Data: Lab Results  Component Value Date   WBC 7.6 09/08/2019   HGB 11.9 (L) 09/08/2019   HCT 35.7 (L) 09/08/2019   MCV 89.5 09/08/2019   PLT 363 09/08/2019    Lab Results  Component Value Date   CREATININE 1.09 09/08/2019    No results found for: PSA  No results found for: TESTOSTERONE  No results found for: HGBA1C  Urinalysis    Component Value Date/Time   COLORURINE YELLOW 06/13/2016 2050   APPEARANCEUR Clear 07/27/2020 1526   LABSPEC 1.020 06/13/2016 2050   PHURINE 5.5 06/13/2016 2050   GLUCOSEU 1+ (A) 07/27/2020 [place] 06/13/2016 2050   BILIRUBINUR Negative 07/27/2020 1526   KETONESUR 15 (A) 06/13/2016 2050   PROTEINUR Negative 07/27/2020 [place] 06/13/2016 2050   NITRITE Negative 07/27/2020 1526   NITRITE NEGATIVE 06/13/2016 2050   LEUKOCYTESUR Negative 07/27/2020 1526    Lab Results  Component Value Date   LABMICR See below: 07/27/2020   WBCUA None seen 07/27/2020   LABEPIT None seen 07/27/2020   BACTERIA None seen 07/27/2020    Pertinent Imaging:  No results found for this or any previous visit.  No results found for this or any previous visit.  No results found for this or any previous visit.  No results found for this or  any previous visit.  No results found for this or any previous visit.  No results found for this or any previous visit.  No results found for this or any previous visit.  No results found for this or any previous visit.   Assessment & Plan:    1. Benign prostatic hyperplasia with urinary obstruction -Continue rapaflo 8mg  BID, finasteride 5mg   - BLADDER SCAN AMB NON-IMAGING - Urinalysis, Routine w reflex microscopic  2. Nocturia -continue rapaflo 8mg  BID and finasteride 5mg  daily   No follow-ups on file.  Nicolette Bang, MD  [facility] [place]

## 2020-12-09 ENCOUNTER — Telehealth (INDEPENDENT_AMBULATORY_CARE_PROVIDER_SITE_OTHER): Payer: Self-pay | Admitting: Internal Medicine

## 2020-12-09 NOTE — Telephone Encounter (Signed)
Patient called the office stated he has taken the pills prescribed for 6 weeks - he is still having diarrhea - please advise - [phone]

## 2020-12-09 NOTE — Telephone Encounter (Signed)
Patient was called. He is not at home at this time. The person answering the phone said that he is due home later this afternoon. They will have the patient call our office back.

## 2020-12-10 NOTE — Telephone Encounter (Signed)
Per Dr. Laural Golden the patient may try Pepto Bismol 2 tablets twice a day. If this does not help the patient will need to make an appointment..  Patient was called . He was not home spoke with Gershon Mussel who states that he will give him the message.

## 2020-12-14 NOTE — Telephone Encounter (Signed)
Patient was called. I explained that he had commented that he had not heard from the call from 06/11/21. I shared with him that I had called on the 26 and 27 of May, both times I talked with Gershon Mussel. On the 27th I gave Tom the message for his Dad as we were closing at 11:30.  I went over Dr. Olevia Perches recommendation about the Pepto Bismol .  When the patient presented to the office this morning he made an appointment to see Dr. Jenetta Downer on Thursday, June 2. He says that he will try the Pepto as recommended.

## 2020-12-16 ENCOUNTER — Other Ambulatory Visit: Payer: Self-pay

## 2020-12-16 ENCOUNTER — Ambulatory Visit (INDEPENDENT_AMBULATORY_CARE_PROVIDER_SITE_OTHER): Payer: Medicare Other | Admitting: Gastroenterology

## 2020-12-16 ENCOUNTER — Encounter (INDEPENDENT_AMBULATORY_CARE_PROVIDER_SITE_OTHER): Payer: Self-pay | Admitting: Gastroenterology

## 2020-12-16 VITALS — BP 151/64 | HR 83 | Temp 97.0°F | Ht 67.0 in | Wt 132.0 lb

## 2020-12-16 DIAGNOSIS — K52832 Lymphocytic colitis: Secondary | ICD-10-CM

## 2020-12-16 MED ORDER — BUDESONIDE 3 MG PO CPEP: ORAL_CAPSULE | ORAL | 0 refills | Status: DC

## 2020-12-16 NOTE — Progress Notes (Signed)
Jordan [name], M.D. Gastroenterology & Hepatology [facility] [address] [place], [place] [address]  Primary Care Physician: Asencion Noble, MD [address] [place] [address]  I will communicate my assessment and recommendations to the referring MD via EMR.  Problems: 1. Lymphocytic colitis  History of Present Illness: Jordan [name] is a 87 y.o. male with past medical history of lymphocytic colitis, aortic stenosis, BPH, coronary artery disease status post stent placement, hypertension, diabetes, hyperlipidemia, who presents for follow up of diarrhea.  The patient was last seen on 10/13/2019. At that time, the patient was given a 6-week course of budesonide taper to which he responded adequately.  He had recurrence of his diarrhea and he was sent a 6-week course of budesonide on 10/15/2020.  Patient reports that he recently finished the 6 week course of budesonide. After finishing this medication, he presented recurrence of loose bowel movements. He is now having 2 BMs per day denies any melena or hematochezia.  Has not presented any watery bowel movements. Sometimes he does not know if he will just pass gas or if stool will come out. He reports that while he was taking his budesonide his stool was formed.  The patient denies having any nausea, vomiting, fever, chills, hematochezia, melena, hematemesis, abdominal distention, abdominal pain, diarrhea, jaundice, pruritus or weight loss.  Past Medical History: Past Medical History:  Diagnosis Date  . Aortic stenosis   . Arthritis   . BPH (benign prostatic hypertrophy)   . CAD (coronary artery disease)    DES to RCA 2009  . Diverticulosis of colon   . Essential hypertension   . Fecal urgency   . History of adenomatous polyp of colon    Tubular adenomas 2016  . Hyperlipidemia   . Lower urinary tract symptoms (LUTS)   . Type 2 diabetes mellitus ([place])   . Wears  glasses     Past Surgical History: Past Surgical History:  Procedure Laterality Date  . BIOPSY  10/02/2019   Procedure: BIOPSY;  Surgeon: Rogene Houston, MD;  [facility];  Service: Endoscopy;;  . CATARACT EXTRACTION W/ INTRAOCULAR LENS  IMPLANT, BILATERAL    . COLONOSCOPY  last one 11/16/2014   polpectomy  . CORONARY ANGIOPLASTY WITH STENT PLACEMENT  12-23-2007  dr hochrein   (abnormal myoview)  PCI w/ DES x1 pRCA (95%),  pCFX 25%,  mCFX 40-50%,  after PDA 30%,  prox, mid, and ostial LAD  30% calcification,  LM diffuse 25% calcification,  ef 60%  . EVALUATION UNDER ANESTHESIA WITH ANAL FISTULECTOMY [name] 06/03/2015   Procedure: ANAL EXAM UNDER ANESTHESIA ,FISTULOTOMY ;  Surgeon: [name], MD;  Location: [place];  Service: General;  Laterality: [name];  . FLEXIBLE SIGMOIDOSCOPY [name] 10/02/2019   Procedure: FLEXIBLE SIGMOIDOSCOPY;  Surgeon: Rogene Houston, MD;  [facility];  Service: Endoscopy;  Laterality: [name];  230  . INGUINAL HERNIA REPAIR Right 2001  . TRANSTHORACIC ECHOCARDIOGRAM  12-06-2007   normal LVF, ef 60%/  mild to moderate  AV calcification without stenosis,  mild AR and TR,  mild LAE    Family History: Family History  Problem Relation Age of Onset  . Heart disease Brother   . Colon cancer Neg Hx     Social History: Social History   Tobacco Use  Smoking Status Former Smoker  . Years: 30.00  . Types: Cigarettes  . Quit date: 06/01/1983  . Years since quitting:  37.5  Smokeless Tobacco Never Used   Social History   Substance and Sexual Activity  Alcohol Use No   Social History   Substance and Sexual Activity  Drug Use No    Allergies: No Known Allergies  Medications: Current Outpatient Medications  Medication Sig Dispense Refill  . Accu-Chek FastClix Lancets MISC USE 1 TO CHECK GLUCOSE ONCE DAILY    . ACCU-CHEK GUIDE test strip USE 1 STRIP TO CHECK GLUCOSE ONCE DAILY    . aspirin 81 MG tablet Take 81 mg by  mouth every evening. 30 tablet   . budesonide (ENTOCORT EC) 3 MG 24 hr capsule Take 3 capsules (9 mg total) by mouth daily for 60 days, THEN 2 capsules (6 mg total) daily for 60 days, THEN 1 capsule (3 mg total) daily. 90 capsule 0  . enalapril (VASOTEC) 10 MG tablet Take 10 mg by mouth daily.    . finasteride (PROSCAR) 5 MG tablet Take 1 tablet (5 mg total) by mouth daily. as directed 90 tablet 0  . glucose blood (ACCU-CHEK GUIDE) test strip Accu-Chek Guide test strips  USE 1 STRIP TO CHECK GLUCOSE ONCE DAILY    . hydrochlorothiazide (MICROZIDE) 12.5 MG capsule Take 12.5 mg by mouth daily.    . memantine (NAMENDA) 5 MG tablet Take 5 mg by mouth daily.   11  . metFORMIN (GLUCOPHAGE) 500 MG tablet Take 500 mg by mouth at bedtime.     . Multiple Vitamins-Minerals (PRESERVISION AREDS PO) Take 1 tablet by mouth daily.    . multivitamin-lutein (OCUVITE-LUTEIN) CAPS capsule Take 1 capsule by mouth daily.    . pravastatin (PRAVACHOL) 80 MG tablet Take 80 mg by mouth daily.    . silodosin (RAPAFLO) 8 MG CAPS capsule Take 1 capsule (8 mg total) by mouth in the morning and at bedtime. 180 capsule 3  . timolol (TIMOPTIC) 0.5 % ophthalmic solution Apply to eye.    . Wheat Dextrin (BENEFIBER) POWD Take 4 g by mouth at bedtime.  0   Current Facility-Administered Medications  Medication Dose Route Frequency Provider Last Rate Last Admin  . ciprofloxacin (CIPRO) tablet 500 mg  500 mg Oral Once Cleon Gustin, MD        Review of Systems: GENERAL: negative for malaise, night sweats HEENT: No changes in hearing or vision, no nose bleeds or other nasal problems. NECK: Negative for lumps, goiter, pain and significant neck swelling RESPIRATORY: Negative for cough, wheezing CARDIOVASCULAR: Negative for chest pain, leg swelling, palpitations, orthopnea GI: SEE HPI MUSCULOSKELETAL: Negative for joint pain or swelling, back pain, and muscle pain. SKIN: Negative for lesions, rash PSYCH: Negative for sleep  disturbance, mood disorder and recent psychosocial stressors. HEMATOLOGY Negative for prolonged bleeding, bruising easily, and swollen nodes. ENDOCRINE: Negative for cold or heat intolerance, polyuria, polydipsia and goiter. NEURO: negative for tremor, gait imbalance, syncope and seizures. The remainder of the review of systems is noncontributory.   Physical Exam: BP (!) 151/64 (BP Location: Left Arm, Patient Position: Sitting, Cuff Size: Small)   Pulse 83   Temp (!) 97 F (36.1 C) (Oral)   Ht 5\' 7"  (1.702 m)   Wt 132 lb (59.9 kg)   BMI 20.67 kg/m  GENERAL: The patient is AO x3, in no acute distress. HEENT: Head is normocephalic and atraumatic. EOMI are intact. Mouth is well hydrated and without lesions. NECK: Supple. No masses LUNGS: Clear to auscultation. No presence of rhonchi/wheezing/rales. Adequate chest expansion HEART: RRR, normal s1 and s2. ABDOMEN: Soft,  nontender, no guarding, no peritoneal signs, and nondistended. BS +. No masses. EXTREMITIES: Without any cyanosis, clubbing, rash, lesions or edema. NEUROLOGIC: AOx3, no focal motor deficit. SKIN: no jaundice, no rashes  Imaging/Labs: as above  I personally reviewed and interpreted the available labs, imaging and endoscopic files.  Impression and Plan: Jordan [name] is a 87 y.o. male with past medical history of lymphocytic colitis, aortic stenosis, BPH, coronary artery disease status post stent placement, hypertension, diabetes, hyperlipidemia, who presents for follow up of diarrhea.  Patient has presented with recurrence of his diarrhea which is likely related to recurrent lymphocytic colitis.  He does not have high stool output but he has presented some fecal soiling and his stools have loose consistency.  I explained to the patient that 70% of patients may have recurrence of lymphocytic colitis after finishing a budesonide taper.  In this situation, the need to be on the lowest dose of budesonide that controls  the symptoms.  Due to this, I will prescribe again obviously taper we will plan to decrease by 3 mg every 2 months.  He likely will need to remain on budesonide 3 mg indefinitely.  Patient understood and agreed.  - Restart budesonide 9 mg every day for 2 months, then decrease to 6 mg every day for 2 months, then decrease to 3 mg every day indefinitely - RTC 4 months  All questions were answered.      [name], MD Gastroenterology and Hepatology [facility]

## 2020-12-16 NOTE — Patient Instructions (Signed)
Restart budesonide 9 mg every day for 2 months, then decrease to 6 mg every day for 2 months, then decrease to 3 mg every day indefinitely

## 2020-12-17 ENCOUNTER — Telehealth (INDEPENDENT_AMBULATORY_CARE_PROVIDER_SITE_OTHER): Payer: Self-pay

## 2020-12-17 NOTE — Telephone Encounter (Signed)
[place] [facility] called again and states when the patient came to pick up his medication the cost was too much and he told them he thought the increments of treatment would be Q two weeks not Q 60 days. Please advise and resend it to [place] or call the pharmacist directly at [phone]. Thanks

## 2020-12-17 NOTE — Telephone Encounter (Signed)
Spoke with patient's daughter today who agreed that the patient had clear benefit from taking the budesonide in the past.  She will insist her father to take the medication as she knows he can afford it.  I also encouraged her to increase the use of protein supplements up to 3 times per day to increase his weight.  She agreed understood.

## 2021-01-07 DIAGNOSIS — M5416 Radiculopathy, lumbar region: Secondary | ICD-10-CM | POA: Diagnosis not present

## 2021-01-10 ENCOUNTER — Encounter: Payer: Self-pay | Admitting: Cardiology

## 2021-01-10 ENCOUNTER — Ambulatory Visit: Payer: Medicare Other | Admitting: Cardiology

## 2021-01-10 ENCOUNTER — Other Ambulatory Visit: Payer: Self-pay

## 2021-01-10 VITALS — BP 126/58 | HR 66 | Ht 68.0 in | Wt 144.0 lb

## 2021-01-10 DIAGNOSIS — I350 Nonrheumatic aortic (valve) stenosis: Secondary | ICD-10-CM

## 2021-01-10 DIAGNOSIS — I25119 Atherosclerotic heart disease of native coronary artery with unspecified angina pectoris: Secondary | ICD-10-CM | POA: Diagnosis not present

## 2021-01-10 DIAGNOSIS — I10 Essential (primary) hypertension: Secondary | ICD-10-CM | POA: Diagnosis not present

## 2021-01-10 NOTE — Progress Notes (Signed)
Cardiology Office Note  Date: 01/10/2021   ID: Jordan [name], DOB 18-Dec-1922, MRN [id number]  PCP:  Asencion Noble, MD  Cardiologist:  Rozann Lesches, MD Electrophysiologist:  None   Chief Complaint  Patient presents with   Cardiac follow-up    History of Present Illness: Jordan [name] is a 87 y.o. male last assessed via telehealth encounter in May 2021.  He presents for a follow-up visit.  He does not report any active angina at this time, NYHA class II dyspnea with basic ADLs.  He still lives in his own home.  He has had some trouble with a "pinched nerve" in his hip, started on course of steroids by PCP.  Also using a cane, does not report any falls or injuries.  His last echocardiogram was in January 2020 at which point he had evidence of mild aortic stenosis and regurgitation with mean gradient 16 mmHg.  We discussed getting a follow-up study in 6 months.  I personally reviewed his ECG today which shows sinus rhythm with right bundle branch block and left anterior fascicular block.  I reviewed his medications which are stable from a cardiac perspective and outlined below.  Past Medical History:  Diagnosis Date   Aortic stenosis    Arthritis    BPH (benign prostatic hypertrophy)    CAD (coronary artery disease)    DES to RCA 2009   Diverticulosis of colon    Essential hypertension    Fecal urgency    History of adenomatous polyp of colon    Tubular adenomas 2016   Hyperlipidemia    Lower urinary tract symptoms (LUTS)    Type 2 diabetes mellitus ([place])    Wears glasses     Past Surgical History:  Procedure Laterality Date   BIOPSY  10/02/2019   Procedure: BIOPSY;  Surgeon: Rogene Houston, MD;  [facility];  Service: Endoscopy;;   CATARACT EXTRACTION W/ INTRAOCULAR LENS  IMPLANT, BILATERAL     COLONOSCOPY  last one 11/16/2014   polpectomy   CORONARY ANGIOPLASTY WITH STENT PLACEMENT  12-23-2007  dr hochrein   (abnormal myoview)  PCI w/  DES x1 pRCA (95%),  pCFX 25%,  mCFX 40-50%,  after PDA 30%,  prox, mid, and ostial LAD  30% calcification,  LM diffuse 25% calcification,  ef 60%   EVALUATION UNDER ANESTHESIA WITH ANAL FISTULECTOMY [name] 06/03/2015   Procedure: ANAL EXAM UNDER ANESTHESIA ,FISTULOTOMY ;  Surgeon: [name], MD;  Location: [facility];  Service: General;  Laterality: [name];   FLEXIBLE SIGMOIDOSCOPY [name] 10/02/2019   Procedure: FLEXIBLE SIGMOIDOSCOPY;  Surgeon: Rogene Houston, MD;  [facility];  Service: Endoscopy;  Laterality: [name];  230   INGUINAL HERNIA REPAIR Right 2001   TRANSTHORACIC ECHOCARDIOGRAM  12-06-2007   normal LVF, ef 60%/  mild to moderate  AV calcification without stenosis,  mild AR and TR,  mild LAE    Current Outpatient Medications  Medication Sig Dispense Refill   Accu-Chek FastClix Lancets MISC USE 1 TO CHECK GLUCOSE ONCE DAILY     ACCU-CHEK GUIDE test strip USE 1 STRIP TO CHECK GLUCOSE ONCE DAILY     aspirin 81 MG tablet Take 81 mg by mouth every evening. 30 tablet    enalapril (VASOTEC) 10 MG tablet Take 10 mg by mouth daily.     finasteride (PROSCAR) 5 MG tablet Take 1 tablet (5 mg total) by mouth daily. as directed 90 tablet 0   glucose  blood (ACCU-CHEK GUIDE) test strip Accu-Chek Guide test strips  USE 1 STRIP TO CHECK GLUCOSE ONCE DAILY     hydrochlorothiazide (MICROZIDE) 12.5 MG capsule Take 12.5 mg by mouth daily.     memantine (NAMENDA) 5 MG tablet Take 5 mg by mouth daily.   11   metFORMIN (GLUCOPHAGE) 500 MG tablet Take 500 mg by mouth at bedtime.      Multiple Vitamins-Minerals (PRESERVISION AREDS PO) Take 1 tablet by mouth daily.     multivitamin-lutein (OCUVITE-LUTEIN) CAPS capsule Take 1 capsule by mouth daily.     pravastatin (PRAVACHOL) 80 MG tablet Take 80 mg by mouth daily.     silodosin (RAPAFLO) 8 MG CAPS capsule Take 1 capsule (8 mg total) by mouth in the morning and at bedtime. 180 capsule 3   timolol (TIMOPTIC) 0.5 % ophthalmic solution  Apply to eye.     Wheat Dextrin (BENEFIBER) POWD Take 4 g by mouth at bedtime.  0   predniSONE (DELTASONE) 10 MG tablet Take by mouth.     Current Facility-Administered Medications  Medication Dose Route Frequency Provider Last Rate Last Admin   ciprofloxacin (CIPRO) tablet 500 mg  500 mg Oral Once [name], [name], MD       Allergies:  Patient has no known allergies.   ROS: Hearing loss.  Physical Exam: VS:  BP (!) 126/58   Pulse 66   Ht 5\' 8"  (1.727 m)   Wt 144 lb (65.3 kg)   SpO2 96%   BMI 21.90 kg/m , BMI Body mass index is 21.9 kg/m.  Wt Readings from Last 3 Encounters:  01/10/21 144 lb (65.3 kg)  12/16/20 132 lb (59.9 kg)  04/28/20 140 lb (63.5 kg)    General: Elderly male, appears comfortable at rest. HEENT: Conjunctiva and lids normal, wearing a mask. Neck: Supple, no elevated JVP or carotid bruits, no thyromegaly. Lungs: Clear to auscultation, nonlabored breathing at rest. Cardiac: Regular rate and rhythm, no S3, 4-1/2 systolic murmur, no pericardial rub. Extremities: No pitting edema.  ECG:  An ECG dated 04/16/2017 was personally reviewed today and demonstrated:  Sinus rhythm with left anterior fascicular block.  Recent Labwork:  09/08/2019: ALT 13; BUN 23; Creat 1.09; Hemoglobin 11.9; Platelets 363; Potassium 4.7; Sodium 138; TSH 2.75   Other Studies Reviewed Today:  Echocardiogram 07/22/2018: Study Conclusions   - Left ventricle: The cavity size was normal. There was focal basal    hypertrophy. Systolic function was normal. The estimated ejection    fraction was in the range of 60% to 65%. Wall motion was normal;    there were no regional wall motion abnormalities. Doppler    parameters are consistent with abnormal left ventricular    relaxation (grade 1 diastolic dysfunction).  - Aortic valve: Mildly calcified annulus. Trileaflet; mildly    thickened leaflets. There was mild stenosis. There was mild    regurgitation. Mean gradient (S): 16 mm Hg. Valve  area (VTI):    1.61 cm^2. Valve area (Vmax): 1.57 cm^2. Valve area (Vmean): 1.61    cm^2.  - Left atrium: The atrium was mildly dilated.  Assessment and Plan:  1.  Aortic stenosis, mild by echocardiogram in January 2020.  Murmur is somewhat more prominent, we will obtain a follow-up echocardiogram for his next clinical assessment in 6 months.  2.  CAD status post DES to the RCA in 2009.  Continue medical therapy and observation in the absence of angina symptoms.  He is on aspirin, Vasotec, and Pravachol.  3.  Essential hypertension, blood pressure is well controlled today, no changes made to current regimen.  Medication Adjustments/Labs and Tests Ordered: Current medicines are reviewed at length with the patient today.  Concerns regarding medicines are outlined above.   Tests Ordered: Orders Placed This Encounter  Procedures   EKG 12-Lead   ECHOCARDIOGRAM COMPLETE    Medication Changes: No orders of the defined types were placed in this encounter.   Disposition:  Follow up  6 months.  Signed, Satira Sark, MD, [facility] 01/10/2021 2:14 PM    [facility] at [facility] [address], [place], [place] [address] Phone: [phone]; Fax: [phone]

## 2021-01-10 NOTE — Patient Instructions (Signed)
Medication Instructions:  Your physician recommends that you continue on your current medications as directed. Please refer to the Current Medication list given to you today.  *If you need a refill on your cardiac medications before your next appointment, please call your pharmacy*   Lab Work: None If you have labs (blood work) drawn today and your tests are completely normal, you will receive your results only by: [place] (if you have MyChart) OR A paper copy in the mail If you have any lab test that is abnormal or we need to change your treatment, we will call you to review the results.   Testing/Procedures: Your physician has requested that you have an echocardiogram. Echocardiography is a painless test that uses sound waves to create images of your heart. It provides your doctor with information about the size and shape of your heart and how well your heart's chambers and valves are working. This procedure takes approximately one hour. There are no restrictions for this procedure.    Follow-Up: At [facility], you and your health needs are our priority.  As part of our continuing mission to provide you with exceptional heart care, we have created designated Provider Care Teams.  These Care Teams include your primary Cardiologist (physician) and Advanced Practice Providers (APPs -  Physician Assistants and Nurse Practitioners) who all work together to provide you with the care you need, when you need it.  We recommend signing up for the patient portal called "MyChart".  Sign up information is provided on this After Visit Summary.  MyChart is used to connect with patients for Virtual Visits (Telemedicine).  Patients are able to view lab/test results, encounter notes, upcoming appointments, etc.  Non-urgent messages can be sent to your provider as well.   To learn more about what you can do with MyChart, go to [email or web address].    Your next appointment:   6  month(s)  The format for your next appointment:   In Person  Provider:   Rozann Lesches, MD   Other Instructions

## 2021-01-13 DIAGNOSIS — E785 Hyperlipidemia, unspecified: Secondary | ICD-10-CM | POA: Diagnosis not present

## 2021-01-13 DIAGNOSIS — I10 Essential (primary) hypertension: Secondary | ICD-10-CM | POA: Diagnosis not present

## 2021-01-13 DIAGNOSIS — E119 Type 2 diabetes mellitus without complications: Secondary | ICD-10-CM | POA: Diagnosis not present

## 2021-01-28 DIAGNOSIS — E1129 Type 2 diabetes mellitus with other diabetic kidney complication: Secondary | ICD-10-CM | POA: Diagnosis not present

## 2021-01-28 DIAGNOSIS — Z79899 Other long term (current) drug therapy: Secondary | ICD-10-CM | POA: Diagnosis not present

## 2021-01-28 DIAGNOSIS — I10 Essential (primary) hypertension: Secondary | ICD-10-CM | POA: Diagnosis not present

## 2021-01-28 DIAGNOSIS — I350 Nonrheumatic aortic (valve) stenosis: Secondary | ICD-10-CM | POA: Diagnosis not present

## 2021-02-04 DIAGNOSIS — I10 Essential (primary) hypertension: Secondary | ICD-10-CM | POA: Diagnosis not present

## 2021-02-04 DIAGNOSIS — E1122 Type 2 diabetes mellitus with diabetic chronic kidney disease: Secondary | ICD-10-CM | POA: Diagnosis not present

## 2021-02-04 DIAGNOSIS — I700 Atherosclerosis of aorta: Secondary | ICD-10-CM | POA: Diagnosis not present

## 2021-02-04 DIAGNOSIS — E785 Hyperlipidemia, unspecified: Secondary | ICD-10-CM | POA: Diagnosis not present

## 2021-02-28 ENCOUNTER — Other Ambulatory Visit: Payer: Self-pay

## 2021-02-28 ENCOUNTER — Ambulatory Visit (HOSPITAL_COMMUNITY)
Admission: RE | Admit: 2021-02-28 | Discharge: 2021-02-28 | Disposition: A | Discharge status: Home / Self Care | Payer: Medicare Other | Source: Ambulatory Visit | Attending: Dermatology | Admitting: Dermatology

## 2021-02-28 ENCOUNTER — Other Ambulatory Visit (HOSPITAL_COMMUNITY): Payer: Self-pay | Admitting: Dermatology

## 2021-02-28 DIAGNOSIS — L80 Vitiligo: Secondary | ICD-10-CM | POA: Insufficient documentation

## 2021-02-28 DIAGNOSIS — Z08 Encounter for follow-up examination after completed treatment for malignant neoplasm: Secondary | ICD-10-CM | POA: Diagnosis not present

## 2021-02-28 DIAGNOSIS — I700 Atherosclerosis of aorta: Secondary | ICD-10-CM | POA: Diagnosis not present

## 2021-02-28 DIAGNOSIS — Z1283 Encounter for screening for malignant neoplasm of skin: Secondary | ICD-10-CM | POA: Diagnosis not present

## 2021-02-28 DIAGNOSIS — Z85820 Personal history of malignant melanoma of skin: Secondary | ICD-10-CM | POA: Diagnosis not present

## 2021-04-15 DIAGNOSIS — E785 Hyperlipidemia, unspecified: Secondary | ICD-10-CM | POA: Diagnosis not present

## 2021-04-15 DIAGNOSIS — I10 Essential (primary) hypertension: Secondary | ICD-10-CM | POA: Diagnosis not present

## 2021-04-15 DIAGNOSIS — E119 Type 2 diabetes mellitus without complications: Secondary | ICD-10-CM | POA: Diagnosis not present

## 2021-04-18 ENCOUNTER — Encounter (INDEPENDENT_AMBULATORY_CARE_PROVIDER_SITE_OTHER): Payer: Self-pay | Admitting: Gastroenterology

## 2021-04-18 ENCOUNTER — Other Ambulatory Visit: Payer: Self-pay

## 2021-04-18 ENCOUNTER — Ambulatory Visit (INDEPENDENT_AMBULATORY_CARE_PROVIDER_SITE_OTHER): Payer: Medicare Other | Admitting: Gastroenterology

## 2021-04-18 VITALS — BP 138/70 | HR 99 | Temp 97.7°F | Ht 68.0 in | Wt 137.9 lb

## 2021-04-18 DIAGNOSIS — K52832 Lymphocytic colitis: Secondary | ICD-10-CM | POA: Diagnosis not present

## 2021-04-18 MED ORDER — BUDESONIDE 3 MG PO CPEP: 3.0000 mg | ORAL_CAPSULE | Freq: Every day | ORAL | 3 refills | Status: DC

## 2021-04-18 NOTE — Progress Notes (Signed)
Maylon Peppers, M.D. Gastroenterology & Hepatology [facility] [address] [place], [place] [address]  Primary Care Physician: Asencion Noble, MD [address] [place] [address]  I will communicate my assessment and recommendations to the referring MD via EMR.  Problems: Lymphocytic colitis  History of Present Illness: Jordan [name] is a 87 y.o. male with past medical history of lymphocytic colitis, aortic stenosis, BPH, coronary artery disease status post stent placement, hypertension, diabetes, hyperlipidemia, who presents for follow up of lymphocytic colitis.  The patient was last seen on 12/16/2020. At that time, the patient was restarted on budesonide taper.  The patient comes today to the office with his daughter.  Patient is currently on budesonide 3 mg qday.  He decreased the dosage from 6 mg to 3 mg today.  States that he has presented major improvement of his diarrhea with this medication, report that he has 1 firm bowel movement every day.  Denies having any side effects with the use of budesonide.  Has no complaints today.  The patient denies having any nausea, vomiting, fever, chills, hematochezia, melena, hematemesis, abdominal distention, abdominal pain, diarrhea, jaundice, pruritus or weight loss.  Past Medical History: Past Medical History:  Diagnosis Date   Aortic stenosis    Arthritis    BPH (benign prostatic hypertrophy)    CAD (coronary artery disease)    DES to RCA 2009   Diverticulosis of colon    Essential hypertension    Fecal urgency    History of adenomatous polyp of colon    Tubular adenomas 2016   Hyperlipidemia    Lower urinary tract symptoms (LUTS)    Type 2 diabetes mellitus ([place])    Wears glasses     Past Surgical History: Past Surgical History:  Procedure Laterality Date   BIOPSY  10/02/2019   Procedure: BIOPSY;  Surgeon: Rogene Houston, MD;  [facility];   Service: Endoscopy;;   CATARACT EXTRACTION W/ INTRAOCULAR LENS  IMPLANT, BILATERAL     COLONOSCOPY  last one 11/16/2014   polpectomy   CORONARY ANGIOPLASTY WITH STENT PLACEMENT  12-23-2007  dr hochrein   (abnormal myoview)  PCI w/ DES x1 pRCA (95%),  pCFX 25%,  mCFX 40-50%,  after PDA 30%,  prox, mid, and ostial LAD  30% calcification,  LM diffuse 25% calcification,  ef 60%   EVALUATION UNDER ANESTHESIA WITH ANAL FISTULECTOMY [name] 06/03/2015   Procedure: ANAL EXAM UNDER ANESTHESIA ,FISTULOTOMY ;  Surgeon: [name], MD;  Location: [facility];  Service: General;  Laterality: [name];   FLEXIBLE SIGMOIDOSCOPY [name] 10/02/2019   Procedure: FLEXIBLE SIGMOIDOSCOPY;  Surgeon: Rogene Houston, MD;  [facility];  Service: Endoscopy;  Laterality: [name];  230   INGUINAL HERNIA REPAIR Right 2001   TRANSTHORACIC ECHOCARDIOGRAM  12-06-2007   normal LVF, ef 60%/  mild to moderate  AV calcification without stenosis,  mild AR and TR,  mild LAE    Family History: Family History  Problem Relation Age of Onset   Heart disease Brother    Colon cancer Neg Hx     Social History: Social History   Tobacco Use  Smoking Status Former   Years: 30.00   Types: Cigarettes   Quit date: 06/01/1983   Years since quitting: 37.9  Smokeless Tobacco Never   Social History   Substance and Sexual Activity  Alcohol Use No   Social History   Substance and Sexual Activity  Drug Use No    Allergies: No Known Allergies  Medications: Current Outpatient Medications  Medication Sig Dispense Refill   Accu-Chek FastClix Lancets MISC USE 1 TO CHECK GLUCOSE ONCE DAILY     ACCU-CHEK GUIDE test strip USE 1 STRIP TO CHECK GLUCOSE ONCE DAILY     aspirin 81 MG tablet Take 81 mg by mouth every evening. 30 tablet    budesonide (ENTOCORT EC) 3 MG 24 hr capsule Take 3 mg by mouth daily.     enalapril (VASOTEC) 10 MG tablet Take 10 mg by mouth daily.     finasteride (PROSCAR) 5 MG tablet Take 1  tablet (5 mg total) by mouth daily. as directed 90 tablet 0   glucose blood (ACCU-CHEK GUIDE) test strip Accu-Chek Guide test strips  USE 1 STRIP TO CHECK GLUCOSE ONCE DAILY     hydrochlorothiazide (MICROZIDE) 12.5 MG capsule Take 12.5 mg by mouth daily.     memantine (NAMENDA) 5 MG tablet Take 5 mg by mouth daily.   11   metFORMIN (GLUCOPHAGE) 500 MG tablet Take 500 mg by mouth at bedtime.      Multiple Vitamins-Minerals (PRESERVISION AREDS PO) Take 1 tablet by mouth daily.     multivitamin-lutein (OCUVITE-LUTEIN) CAPS capsule Take 1 capsule by mouth in the morning and at bedtime.     OVER THE COUNTER MEDICATION Melatonin 3 mg prn Fiber daily Miralax QHS.     pravastatin (PRAVACHOL) 80 MG tablet Take 80 mg by mouth daily.     silodosin (RAPAFLO) 8 MG CAPS capsule Take 1 capsule (8 mg total) by mouth in the morning and at bedtime. 180 capsule 3   timolol (TIMOPTIC) 0.5 % ophthalmic solution Apply to eye.     Current Facility-Administered Medications  Medication Dose Route Frequency Provider Last Rate Last Admin   ciprofloxacin (CIPRO) tablet 500 mg  500 mg Oral Once Cleon Gustin, MD        Review of Systems: GENERAL: negative for malaise, night sweats HEENT: No changes in hearing or vision, no nose bleeds or other nasal problems. NECK: Negative for lumps, goiter, pain and significant neck swelling RESPIRATORY: Negative for cough, wheezing CARDIOVASCULAR: Negative for chest pain, leg swelling, palpitations, orthopnea GI: SEE HPI MUSCULOSKELETAL: Negative for joint pain or swelling, back pain, and muscle pain. SKIN: Negative for lesions, rash PSYCH: Negative for sleep disturbance, mood disorder and recent psychosocial stressors. HEMATOLOGY Negative for prolonged bleeding, bruising easily, and swollen nodes. ENDOCRINE: Negative for cold or heat intolerance, polyuria, polydipsia and goiter. NEURO: negative for tremor, gait imbalance, syncope and seizures. The remainder of the  review of systems is noncontributory.   Physical Exam: BP 138/70 (BP Location: Left Arm, Patient Position: Sitting, Cuff Size: Small)   Pulse 99   Temp 97.7 F (36.5 C) (Oral)   Ht 5\' 8"  (1.727 m)   Wt 137 lb 14.4 oz (62.6 kg)   BMI 20.97 kg/m  GENERAL: The patient is AO x3, in no acute distress. Elder, uses cane. HEENT: Head is normocephalic and atraumatic. EOMI are intact. Mouth is well hydrated and without lesions. NECK: Supple. No masses LUNGS: Clear to auscultation. No presence of rhonchi/wheezing/rales. Adequate chest expansion HEART: RRR, normal s1 and s2. ABDOMEN: Soft, nontender, no guarding, no peritoneal signs, and nondistended. BS +. No masses. EXTREMITIES: Without any cyanosis, clubbing, rash, lesions or edema. NEUROLOGIC: AOx3, no focal motor deficit. SKIN: no jaundice, no rashes  Imaging/Labs: as above  I personally reviewed and interpreted the available labs, imaging  and endoscopic files.  Impression and Plan: Jordan [name] is a 87 y.o. male with past medical history of lymphocytic colitis, aortic stenosis, BPH, coronary artery disease status post stent placement, hypertension, diabetes, hyperlipidemia, who presents for follow up of lymphocytic colitis.  The patient has presented major improvement of his symptoms after restarting his budesonide taper.  He had symptom control while on the mid dose of budesonide and has recently decreased to 3 mg daily.  I advised the daughter that he should continue this dosage indefinitely but if he were to have recurrent symptoms within the next 2 to 3 weeks, the dose issue should be increased to 6 mg every day indefinitely.  I also advised her that if he were to have recurrent diarrhea months from today, she should also let us know to rule out or causes including infectious etiologies.  Both understood and agreed.  - Continue budesonide 3 mg qday  All questions were answered.      [name],  MD Gastroenterology and Hepatology [facility]

## 2021-04-18 NOTE — Patient Instructions (Signed)
Continue budesonide 3 mg qday

## 2021-04-26 DIAGNOSIS — Z23 Encounter for immunization: Secondary | ICD-10-CM | POA: Diagnosis not present

## 2021-04-29 ENCOUNTER — Other Ambulatory Visit: Payer: Self-pay | Admitting: Urology

## 2021-05-04 ENCOUNTER — Other Ambulatory Visit: Payer: Self-pay

## 2021-05-04 ENCOUNTER — Ambulatory Visit: Payer: Medicare Other | Admitting: Urology

## 2021-05-04 ENCOUNTER — Encounter: Payer: Self-pay | Admitting: Urology

## 2021-05-04 VITALS — BP 144/55 | HR 72

## 2021-05-04 DIAGNOSIS — N138 Other obstructive and reflux uropathy: Secondary | ICD-10-CM | POA: Diagnosis not present

## 2021-05-04 DIAGNOSIS — N401 Enlarged prostate with lower urinary tract symptoms: Secondary | ICD-10-CM | POA: Diagnosis not present

## 2021-05-04 DIAGNOSIS — R351 Nocturia: Secondary | ICD-10-CM | POA: Diagnosis not present

## 2021-05-04 LAB — BLADDER SCAN AMB NON-IMAGING: Scan Result: 49

## 2021-05-04 MED ORDER — FINASTERIDE 5 MG PO TABS: 5.0000 mg | ORAL_TABLET | Freq: Every day | ORAL | 3 refills | Status: DC

## 2021-05-04 MED ORDER — SILODOSIN 8 MG PO CAPS: 8.0000 mg | ORAL_CAPSULE | Freq: Two times a day (BID) | ORAL | 3 refills | Status: DC

## 2021-05-04 NOTE — Progress Notes (Signed)
05/04/2021 3:02 PM   Jordan [name] 1922-11-20 [id number]  Referring provider: Asencion Noble, MD [address] [place],  [place] [address]  Followup BPh and nocturia  HPI: [name] is a 87yo here for followup for BPH with nocturia. IPSS 17 QOl 3. She has urinary frequency every 2 hours. His urine stream is fair. Nocturia 2-3x. No urinary urgency. No straining to urinate. No urinary hesitancy. He has post void dribbling. No other complaints today.    PMH: Past Medical History:  Diagnosis Date   Aortic stenosis    Arthritis    BPH (benign prostatic hypertrophy)    CAD (coronary artery disease)    DES to RCA 2009   Diverticulosis of colon    Essential hypertension    Fecal urgency    History of adenomatous polyp of colon    Tubular adenomas 2016   Hyperlipidemia    Lower urinary tract symptoms (LUTS)    Type 2 diabetes mellitus ([place])    Wears glasses     Surgical History: Past Surgical History:  Procedure Laterality Date   BIOPSY  10/02/2019   Procedure: BIOPSY;  Surgeon: Rogene Houston, MD;  [facility];  Service: Endoscopy;;   CATARACT EXTRACTION W/ INTRAOCULAR LENS  IMPLANT, BILATERAL     COLONOSCOPY  last one 11/16/2014   polpectomy   CORONARY ANGIOPLASTY WITH STENT PLACEMENT  12-23-2007  dr hochrein   (abnormal myoview)  PCI w/ DES x1 pRCA (95%),  pCFX 25%,  mCFX 40-50%,  after PDA 30%,  prox, mid, and ostial LAD  30% calcification,  LM diffuse 25% calcification,  ef 60%   EVALUATION UNDER ANESTHESIA WITH ANAL FISTULECTOMY [name] 06/03/2015   Procedure: ANAL EXAM UNDER ANESTHESIA ,FISTULOTOMY ;  Surgeon: [name], MD;  Location: [place];  Service: General;  Laterality: [name];   FLEXIBLE SIGMOIDOSCOPY [name] 10/02/2019   Procedure: FLEXIBLE SIGMOIDOSCOPY;  Surgeon: Rogene Houston, MD;  [facility];  Service: Endoscopy;  Laterality: [name];  230   INGUINAL HERNIA REPAIR Right 2001   TRANSTHORACIC ECHOCARDIOGRAM   12-06-2007   normal LVF, ef 60%/  mild to moderate  AV calcification without stenosis,  mild AR and TR,  mild LAE    Home Medications:  Allergies as of 05/04/2021   No Known Allergies      Medication List        Accurate as of May 04, 2021  3:02 PM. If you have any questions, ask your nurse or doctor.          Accu-Chek FastClix Lancets Misc USE 1 TO CHECK GLUCOSE ONCE DAILY   Accu-Chek Guide test strip Generic drug: glucose blood Accu-Chek Guide test strips  USE 1 STRIP TO CHECK GLUCOSE ONCE DAILY   Accu-Chek Guide test strip Generic drug: glucose blood USE 1 STRIP TO CHECK GLUCOSE ONCE DAILY   aspirin 81 MG tablet Take 81 mg by mouth every evening.   budesonide 3 MG 24 hr capsule Commonly known as: ENTOCORT EC Take 1 capsule (3 mg total) by mouth daily.   enalapril 10 MG tablet Commonly known as: VASOTEC Take 10 mg by mouth daily.   finasteride 5 MG tablet Commonly known as: PROSCAR TAKE 1 TABLET BY MOUTH DAILY AS DIRECTED.   hydrochlorothiazide 12.5 MG capsule Commonly known as: MICROZIDE Take 12.5 mg by mouth daily.   memantine 5 MG tablet Commonly known as: NAMENDA Take 5 mg by mouth daily.   metFORMIN 500 MG  tablet Commonly known as: GLUCOPHAGE Take 500 mg by mouth at bedtime.   OVER THE COUNTER MEDICATION Melatonin 3 mg prn Fiber daily Miralax QHS.   pravastatin 80 MG tablet Commonly known as: PRAVACHOL Take 80 mg by mouth daily.   PRESERVISION AREDS PO Take 1 tablet by mouth daily.   multivitamin-lutein Caps capsule Take 1 capsule by mouth in the morning and at bedtime.   silodosin 8 MG Caps capsule Commonly known as: RAPAFLO Take 1 capsule (8 mg total) by mouth in the morning and at bedtime.   timolol 0.5 % ophthalmic solution Commonly known as: TIMOPTIC Apply to eye.        Allergies: No Known Allergies  Family History: Family History  Problem Relation Age of Onset   Heart disease Brother    Colon cancer Neg Hx      Social History:  reports that he quit smoking about 37 years ago. His smoking use included cigarettes. He has never used smokeless tobacco. He reports that he does not drink alcohol and does not use drugs.  ROS: All other review of systems were reviewed and are negative except what is noted above in HPI  Physical Exam: BP (!) 144/55   Pulse 72   Constitutional:  Alert and oriented, No acute distress. HEENT: [place] AT, moist mucus membranes.  Trachea midline, no masses. Cardiovascular: No clubbing, cyanosis, or edema. Respiratory: Normal respiratory effort, no increased work of breathing. GI: Abdomen is soft, nontender, nondistended, no abdominal masses GU: No CVA tenderness.  Lymph: No cervical or inguinal lymphadenopathy. Skin: No rashes, bruises or suspicious lesions. Neurologic: Grossly intact, no focal deficits, moving all 4 extremities. Psychiatric: Normal mood and affect.  Laboratory Data: Lab Results  Component Value Date   WBC 7.6 09/08/2019   HGB 11.9 (L) 09/08/2019   HCT 35.7 (L) 09/08/2019   MCV 89.5 09/08/2019   PLT 363 09/08/2019    Lab Results  Component Value Date   CREATININE 1.09 09/08/2019    No results found for: PSA  No results found for: TESTOSTERONE  No results found for: HGBA1C  Urinalysis    Component Value Date/Time   COLORURINE YELLOW 06/13/2016 2050   APPEARANCEUR Clear 11/03/2020 1506   LABSPEC 1.020 06/13/2016 2050   PHURINE 5.5 06/13/2016 2050   GLUCOSEU Negative 11/03/2020 1506   HGBUR NEGATIVE 06/13/2016 2050   BILIRUBINUR Negative 11/03/2020 1506   KETONESUR 15 (A) 06/13/2016 2050   PROTEINUR Negative 11/03/2020 [place] 06/13/2016 2050   NITRITE Negative 11/03/2020 1506   NITRITE NEGATIVE 06/13/2016 2050   LEUKOCYTESUR Negative 11/03/2020 1506    Lab Results  Component Value Date   LABMICR See below: 07/27/2020   WBCUA None seen 07/27/2020   LABEPIT None seen 07/27/2020   BACTERIA None seen 07/27/2020     Pertinent Imaging:  No results found for this or any previous visit.  No results found for this or any previous visit.  No results found for this or any previous visit.  No results found for this or any previous visit.  No results found for this or any previous visit.  No results found for this or any previous visit.  No results found for this or any previous visit.  No results found for this or any previous visit.   Assessment & Plan:    1. Benign prostatic hyperplasia with urinary obstruction -continue rapaflo 8mg  BID and finasteride 5mg  daily - Urinalysis, Routine w reflex microscopic - BLADDER SCAN AMB NON-IMAGING  2. Nocturia -Continue rapaflo 8mg  BID and finasteride.    No follow-ups on file.  Nicolette Bang, MD  [facility] [place]

## 2021-05-04 NOTE — Progress Notes (Signed)
post void residual=49 Urological Symptom Review  Patient is experiencing the following symptoms: Frequent urination Get up at night to urinate   Review of Systems  Gastrointestinal (upper)  : Negative for upper GI symptoms  Gastrointestinal (lower) : Negative for lower GI symptoms  Constitutional : Negative for symptoms  Skin: Negative for skin symptoms  Eyes: Negative for eye symptoms  Ear/Nose/Throat : Negative for Ear/Nose/Throat symptoms  Hematologic/Lymphatic: Easy bruising  Cardiovascular : Negative for cardiovascular symptoms  Respiratory : Negative for respiratory symptoms  Endocrine: Negative for endocrine symptoms  Musculoskeletal: Negative for musculoskeletal symptoms  Neurological: Negative for neurological symptoms  Psychologic: Negative for psychiatric symptoms

## 2021-05-04 NOTE — Patient Instructions (Signed)
Benign Prostatic Hyperplasia Benign prostatic hyperplasia (BPH) is an enlarged prostate gland that is caused by the normal aging process and not by cancer. The prostate is a walnut-sized gland that is involved in the production of semen. It is located in front of the rectum and below the bladder. The bladder stores urine and the urethra is the tube that carries the urine out of the body. The prostate may get bigger as a man gets older. An enlarged prostate can press on the urethra. This can make it harder to pass urine. The build-up of urine in the bladder can cause infection. Back pressure and infection may progress to bladder damage and kidney (renal) failure. What are the causes? This condition is part of a normal aging process. However, not all men develop problems from this condition. If the prostate enlarges away from the urethra, urine flow will not be blocked. If it enlarges toward the urethra and compresses it, there will be problems passing urine. What increases the risk? This condition is more likely to develop in men over the age of 50 years. What are the signs or symptoms? Symptoms of this condition include: Getting up often during the night to urinate. Needing to urinate frequently during the day. Difficulty starting urine flow. Decrease in size and strength of your urine stream. Leaking (dribbling) after urinating. Inability to pass urine. This needs immediate treatment. Inability to completely empty your bladder. Pain when you pass urine. This is more common if there is also an infection. Urinary tract infection (UTI). How is this diagnosed? This condition is diagnosed based on your medical history, a physical exam, and your symptoms. Tests will also be done, such as: A post-void bladder scan. This measures any amount of urine that may remain in your bladder after you finish urinating. A digital rectal exam. In a rectal exam, your health care provider checks your prostate by  putting a lubricated, gloved finger into your rectum to feel the back of your prostate gland. This exam detects the size of your gland and any abnormal lumps or growths. An exam of your urine (urinalysis). A prostate specific antigen (PSA) screening. This is a blood test used to screen for prostate cancer. An ultrasound. This test uses sound waves to electronically produce a picture of your prostate gland. Your health care provider may refer you to a specialist in kidney and prostate diseases (urologist). How is this treated? Once symptoms begin, your health care provider will monitor your condition (active surveillance or watchful waiting). Treatment for this condition will depend on the severity of your condition. Treatment may include: Observation and yearly exams. This may be the only treatment needed if your condition and symptoms are mild. Medicines to relieve your symptoms, including: Medicines to shrink the prostate. Medicines to relax the muscle of the prostate. Surgery in severe cases. Surgery may include: Prostatectomy. In this procedure, the prostate tissue is removed completely through an open incision or with a laparoscope or robotics. Transurethral resection of the prostate (TURP). In this procedure, a tool is inserted through the opening at the tip of the penis (urethra). It is used to cut away tissue of the inner core of the prostate. The pieces are removed through the same opening of the penis. This removes the blockage. Transurethral incision (TUIP). In this procedure, small cuts are made in the prostate. This lessens the prostate's pressure on the urethra. Transurethral microwave thermotherapy (TUMT). This procedure uses microwaves to create heat. The heat destroys and removes a   small amount of prostate tissue. Transurethral needle ablation (TUNA). This procedure uses radio frequencies to destroy and remove a small amount of prostate tissue. Interstitial laser coagulation (ILC).  This procedure uses a laser to destroy and remove a small amount of prostate tissue. Transurethral electrovaporization (TUVP). This procedure uses electrodes to destroy and remove a small amount of prostate tissue. Prostatic urethral lift. This procedure inserts an implant to push the lobes of the prostate away from the urethra. Follow these instructions at home: Take over-the-counter and prescription medicines only as told by your health care provider. Monitor your symptoms for any changes. Contact your health care provider with any changes. Avoid drinking large amounts of liquid before going to bed or out in public. Avoid or reduce how much caffeine or alcohol you drink. Give yourself time when you urinate. Keep all follow-up visits as told by your health care provider. This is important. Contact a health care provider if: You have unexplained back pain. Your symptoms do not get better with treatment. You develop side effects from the medicine you are taking. Your urine becomes very dark or has a bad smell. Your lower abdomen becomes distended and you have trouble passing your urine. Get help right away if: You have a fever or chills. You suddenly cannot urinate. You feel lightheaded, or very dizzy, or you faint. There are large amounts of blood or clots in the urine. Your urinary problems become hard to manage. You develop moderate to severe low back or flank pain. The flank is the side of your body between the ribs and the hip. These symptoms may represent a serious problem that is an emergency. Do not wait to see if the symptoms will go away. Get medical help right away. Call your local emergency services (911 in the [place] Do not drive yourself to the hospital. Summary Benign prostatic hyperplasia (BPH) is an enlarged prostate that is caused by the normal aging process and not by cancer. An enlarged prostate can press on the urethra. This can make it hard to pass urine. This  condition is part of a normal aging process and is more likely to develop in men over the age of 50 years. Get help right away if you suddenly cannot urinate. This information is not intended to replace advice given to you by your health care provider. Make sure you discuss any questions you have with your health care provider. Document Revised: 10/13/2020 Document Reviewed: 03/11/2020 Elsevier Patient Education  2022 Elsevier Inc.  

## 2021-05-05 LAB — URINALYSIS, ROUTINE W REFLEX MICROSCOPIC
Bilirubin, UA: NEGATIVE
Glucose, UA: NEGATIVE
Ketones, UA: NEGATIVE
Leukocytes,UA: NEGATIVE
Nitrite, UA: NEGATIVE
Protein,UA: NEGATIVE
RBC, UA: NEGATIVE
Specific Gravity, UA: 1.01 (ref 1.005–1.030)
Urobilinogen, Ur: 0.2 mg/dL (ref 0.2–1.0)
pH, UA: 5.5 (ref 5.0–7.5)

## 2021-05-17 DIAGNOSIS — H3589 Other specified retinal disorders: Secondary | ICD-10-CM | POA: Diagnosis not present

## 2021-05-31 DIAGNOSIS — E785 Hyperlipidemia, unspecified: Secondary | ICD-10-CM | POA: Diagnosis not present

## 2021-05-31 DIAGNOSIS — I2510 Atherosclerotic heart disease of native coronary artery without angina pectoris: Secondary | ICD-10-CM | POA: Diagnosis not present

## 2021-05-31 DIAGNOSIS — I10 Essential (primary) hypertension: Secondary | ICD-10-CM | POA: Diagnosis not present

## 2021-05-31 DIAGNOSIS — E1129 Type 2 diabetes mellitus with other diabetic kidney complication: Secondary | ICD-10-CM | POA: Diagnosis not present

## 2021-06-02 DIAGNOSIS — Z1283 Encounter for screening for malignant neoplasm of skin: Secondary | ICD-10-CM | POA: Diagnosis not present

## 2021-06-02 DIAGNOSIS — Z08 Encounter for follow-up examination after completed treatment for malignant neoplasm: Secondary | ICD-10-CM | POA: Diagnosis not present

## 2021-06-02 DIAGNOSIS — Z85820 Personal history of malignant melanoma of skin: Secondary | ICD-10-CM | POA: Diagnosis not present

## 2021-06-02 DIAGNOSIS — D225 Melanocytic nevi of trunk: Secondary | ICD-10-CM | POA: Diagnosis not present

## 2021-06-07 DIAGNOSIS — E1122 Type 2 diabetes mellitus with diabetic chronic kidney disease: Secondary | ICD-10-CM | POA: Diagnosis not present

## 2021-06-07 DIAGNOSIS — I350 Nonrheumatic aortic (valve) stenosis: Secondary | ICD-10-CM | POA: Diagnosis not present

## 2021-06-07 DIAGNOSIS — R413 Other amnesia: Secondary | ICD-10-CM | POA: Diagnosis not present

## 2021-06-07 DIAGNOSIS — I10 Essential (primary) hypertension: Secondary | ICD-10-CM | POA: Diagnosis not present

## 2021-07-12 ENCOUNTER — Other Ambulatory Visit: Payer: Self-pay

## 2021-07-12 ENCOUNTER — Ambulatory Visit (HOSPITAL_COMMUNITY)
Admission: RE | Admit: 2021-07-12 | Discharge: 2021-07-12 | Disposition: A | Discharge status: Home / Self Care | Payer: Medicare Other | Source: Ambulatory Visit | Attending: Internal Medicine | Admitting: Internal Medicine

## 2021-07-12 DIAGNOSIS — I350 Nonrheumatic aortic (valve) stenosis: Secondary | ICD-10-CM | POA: Insufficient documentation

## 2021-07-12 LAB — ECHOCARDIOGRAM COMPLETE
AR max vel: 1.08 cm2
AV Area VTI: 1.13 cm2
AV Area mean vel: 1.05 cm2
AV Mean grad: 28 mmHg
AV Peak grad: 44.4 mmHg
Ao pk vel: 3.33 m/s
Area-P 1/2: 2.11 cm2
P 1/2 time: 460 msec
S' Lateral: 1.7 cm

## 2021-07-12 NOTE — Progress Notes (Signed)
*  PRELIMINARY RESULTS* Echocardiogram 2D Echocardiogram has been performed.  Jordan [name] 07/12/2021, 3:00 PM

## 2021-07-15 DIAGNOSIS — Z0000 Encounter for general adult medical examination without abnormal findings: Secondary | ICD-10-CM | POA: Diagnosis not present

## 2021-07-15 DIAGNOSIS — I10 Essential (primary) hypertension: Secondary | ICD-10-CM | POA: Diagnosis not present

## 2021-07-15 DIAGNOSIS — E785 Hyperlipidemia, unspecified: Secondary | ICD-10-CM | POA: Diagnosis not present

## 2021-07-25 ENCOUNTER — Telehealth (INDEPENDENT_AMBULATORY_CARE_PROVIDER_SITE_OTHER): Payer: Self-pay | Admitting: *Deleted

## 2021-07-25 NOTE — Telephone Encounter (Signed)
Patients daughter [name] left voicemail that patient needs PA for budesonide. I sent in PA through cover my meds. Await decision

## 2021-07-26 NOTE — Telephone Encounter (Signed)
Pt.notified

## 2021-07-26 NOTE — Telephone Encounter (Signed)
[employer] called and states budesonide is approve for one year.

## 2021-07-27 NOTE — Telephone Encounter (Signed)
error 

## 2021-09-01 DIAGNOSIS — E1129 Type 2 diabetes mellitus with other diabetic kidney complication: Secondary | ICD-10-CM | POA: Diagnosis not present

## 2021-10-24 DIAGNOSIS — H903 Sensorineural hearing loss, bilateral: Secondary | ICD-10-CM | POA: Diagnosis not present

## 2021-11-24 DIAGNOSIS — C44629 Squamous cell carcinoma of skin of left upper limb, including shoulder: Secondary | ICD-10-CM | POA: Diagnosis not present

## 2021-12-06 DIAGNOSIS — I10 Essential (primary) hypertension: Secondary | ICD-10-CM | POA: Diagnosis not present

## 2021-12-06 DIAGNOSIS — E1129 Type 2 diabetes mellitus with other diabetic kidney complication: Secondary | ICD-10-CM | POA: Diagnosis not present

## 2021-12-06 DIAGNOSIS — C44629 Squamous cell carcinoma of skin of left upper limb, including shoulder: Secondary | ICD-10-CM | POA: Diagnosis not present

## 2021-12-06 DIAGNOSIS — G301 Alzheimer's disease with late onset: Secondary | ICD-10-CM | POA: Diagnosis not present

## 2021-12-06 DIAGNOSIS — Z79899 Other long term (current) drug therapy: Secondary | ICD-10-CM | POA: Diagnosis not present

## 2021-12-06 DIAGNOSIS — L980 Pyogenic granuloma: Secondary | ICD-10-CM | POA: Diagnosis not present

## 2021-12-08 DIAGNOSIS — C44629 Squamous cell carcinoma of skin of left upper limb, including shoulder: Secondary | ICD-10-CM | POA: Diagnosis not present

## 2021-12-14 DIAGNOSIS — H903 Sensorineural hearing loss, bilateral: Secondary | ICD-10-CM | POA: Diagnosis not present

## 2022-01-05 DIAGNOSIS — Z85828 Personal history of other malignant neoplasm of skin: Secondary | ICD-10-CM | POA: Diagnosis not present

## 2022-01-05 DIAGNOSIS — Z85820 Personal history of malignant melanoma of skin: Secondary | ICD-10-CM | POA: Diagnosis not present

## 2022-01-05 DIAGNOSIS — Z08 Encounter for follow-up examination after completed treatment for malignant neoplasm: Secondary | ICD-10-CM | POA: Diagnosis not present

## 2022-01-05 DIAGNOSIS — Z1283 Encounter for screening for malignant neoplasm of skin: Secondary | ICD-10-CM | POA: Diagnosis not present

## 2022-01-26 DIAGNOSIS — Z85828 Personal history of other malignant neoplasm of skin: Secondary | ICD-10-CM | POA: Diagnosis not present

## 2022-01-26 DIAGNOSIS — Z08 Encounter for follow-up examination after completed treatment for malignant neoplasm: Secondary | ICD-10-CM | POA: Diagnosis not present

## 2022-02-03 DIAGNOSIS — G309 Alzheimer's disease, unspecified: Secondary | ICD-10-CM | POA: Diagnosis not present

## 2022-02-03 DIAGNOSIS — R269 Unspecified abnormalities of gait and mobility: Secondary | ICD-10-CM | POA: Diagnosis not present

## 2022-03-08 ENCOUNTER — Telehealth: Payer: Self-pay

## 2022-03-08 NOTE — Telephone Encounter (Signed)
Patients daughter called advising that patient has recently begun to complain of urinary incontinence. They wanted to know if there was any medication that could be prescribed to help with those symptoms.

## 2022-03-13 NOTE — Telephone Encounter (Signed)
Daughter [name] calling to check back on previous question.  Call back at:  [phone]  Thanks, Helene Kelp

## 2022-03-14 ENCOUNTER — Ambulatory Visit: Payer: Medicare Other | Admitting: Urology

## 2022-03-14 DIAGNOSIS — Z79899 Other long term (current) drug therapy: Secondary | ICD-10-CM | POA: Diagnosis not present

## 2022-03-14 DIAGNOSIS — E1129 Type 2 diabetes mellitus with other diabetic kidney complication: Secondary | ICD-10-CM | POA: Diagnosis not present

## 2022-03-14 DIAGNOSIS — I10 Essential (primary) hypertension: Secondary | ICD-10-CM | POA: Diagnosis not present

## 2022-03-14 DIAGNOSIS — G301 Alzheimer's disease with late onset: Secondary | ICD-10-CM | POA: Diagnosis not present

## 2022-03-14 NOTE — Telephone Encounter (Signed)
Appt scheduled with daughter for 09/01 for 12pm

## 2022-03-17 ENCOUNTER — Encounter: Payer: Self-pay | Admitting: Urology

## 2022-03-17 ENCOUNTER — Ambulatory Visit (INDEPENDENT_AMBULATORY_CARE_PROVIDER_SITE_OTHER): Payer: Medicare Other | Admitting: Urology

## 2022-03-17 VITALS — BP 114/55 | HR 97

## 2022-03-17 DIAGNOSIS — N138 Other obstructive and reflux uropathy: Secondary | ICD-10-CM

## 2022-03-17 DIAGNOSIS — I10 Essential (primary) hypertension: Secondary | ICD-10-CM | POA: Diagnosis not present

## 2022-03-17 DIAGNOSIS — N401 Enlarged prostate with lower urinary tract symptoms: Secondary | ICD-10-CM

## 2022-03-17 DIAGNOSIS — E1122 Type 2 diabetes mellitus with diabetic chronic kidney disease: Secondary | ICD-10-CM | POA: Diagnosis not present

## 2022-03-17 DIAGNOSIS — R351 Nocturia: Secondary | ICD-10-CM | POA: Diagnosis not present

## 2022-03-17 DIAGNOSIS — G309 Alzheimer's disease, unspecified: Secondary | ICD-10-CM | POA: Diagnosis not present

## 2022-03-17 DIAGNOSIS — R32 Unspecified urinary incontinence: Secondary | ICD-10-CM

## 2022-03-17 DIAGNOSIS — I493 Ventricular premature depolarization: Secondary | ICD-10-CM | POA: Diagnosis not present

## 2022-03-17 LAB — URINALYSIS, ROUTINE W REFLEX MICROSCOPIC
Bilirubin, UA: NEGATIVE
Glucose, UA: NEGATIVE
Leukocytes,UA: NEGATIVE
Nitrite, UA: NEGATIVE
RBC, UA: NEGATIVE
Specific Gravity, UA: 1.025 (ref 1.005–1.030)
Urobilinogen, Ur: 0.2 mg/dL (ref 0.2–1.0)
pH, UA: 5 (ref 5.0–7.5)

## 2022-03-17 LAB — BLADDER SCAN AMB NON-IMAGING: Scan Result: 12

## 2022-03-17 MED ORDER — SILODOSIN 8 MG PO CAPS: 8.0000 mg | ORAL_CAPSULE | Freq: Two times a day (BID) | ORAL | 3 refills | Status: DC

## 2022-03-17 MED ORDER — MIRABEGRON ER 25 MG PO TB24: 25.0000 mg | ORAL_TABLET | Freq: Every day | ORAL | 0 refills | Status: DC

## 2022-03-17 NOTE — Patient Instructions (Signed)

## 2022-03-17 NOTE — Progress Notes (Signed)
03/17/2022 12:45 PM   Jordan [name] 1923/02/04 [id number]  Referring provider: Asencion Noble, MD [address] [place],  [place] [address]  Urinary incontinence   HPI: Jordan [name] is a 88yo here for evaluation of urinary incontinence and dysuria. Starting 1 month ago he developed unaware urinary incontinence and dysuria. PVR 12cc. He reamins of rapaflo 8ng daily. Urine stream fair. No straining to urinate.    PMH: Past Medical History:  Diagnosis Date   Aortic stenosis    Arthritis    BPH (benign prostatic hypertrophy)    CAD (coronary artery disease)    DES to RCA 2009   Diverticulosis of colon    Essential hypertension    Fecal urgency    History of adenomatous polyp of colon    Tubular adenomas 2016   Hyperlipidemia    Lower urinary tract symptoms (LUTS)    Type 2 diabetes mellitus ([place])    Wears glasses     Surgical History: Past Surgical History:  Procedure Laterality Date   BIOPSY  10/02/2019   Procedure: BIOPSY;  Surgeon: Rogene Houston, MD;  [facility];  Service: Endoscopy;;   CATARACT EXTRACTION W/ INTRAOCULAR LENS  IMPLANT, BILATERAL     COLONOSCOPY  last one 11/16/2014   polpectomy   CORONARY ANGIOPLASTY WITH STENT PLACEMENT  12-23-2007  dr hochrein   (abnormal myoview)  PCI w/ DES x1 pRCA (95%),  pCFX 25%,  mCFX 40-50%,  after PDA 30%,  prox, mid, and ostial LAD  30% calcification,  LM diffuse 25% calcification,  ef 60%   EVALUATION UNDER ANESTHESIA WITH ANAL FISTULECTOMY [name] 06/03/2015   Procedure: ANAL EXAM UNDER ANESTHESIA ,FISTULOTOMY ;  Surgeon: [name], MD;  Location: [facility];  Service: General;  Laterality: [name];   FLEXIBLE SIGMOIDOSCOPY [name] 10/02/2019   Procedure: FLEXIBLE SIGMOIDOSCOPY;  Surgeon: Rogene Houston, MD;  [facility];  Service: Endoscopy;  Laterality: [name];  230   INGUINAL HERNIA REPAIR Right 2001   TRANSTHORACIC ECHOCARDIOGRAM  12-06-2007   normal LVF, ef 60%/  mild to  moderate  AV calcification without stenosis,  mild AR and TR,  mild LAE    Home Medications:  Allergies as of 03/17/2022   No Known Allergies      Medication List        Accurate as of March 17, 2022 12:45 PM. If you have any questions, ask your nurse or doctor.          Accu-Chek FastClix Lancets Misc USE 1 TO CHECK GLUCOSE ONCE DAILY   Accu-Chek Guide test strip Generic drug: glucose blood Accu-Chek Guide test strips  USE 1 STRIP TO CHECK GLUCOSE ONCE DAILY   Accu-Chek Guide test strip Generic drug: glucose blood USE 1 STRIP TO CHECK GLUCOSE ONCE DAILY   aspirin 81 MG tablet Take 81 mg by mouth every evening.   budesonide 3 MG 24 hr capsule Commonly known as: ENTOCORT EC Take 1 capsule (3 mg total) by mouth daily.   enalapril 10 MG tablet Commonly known as: VASOTEC Take 10 mg by mouth daily.   finasteride 5 MG tablet Commonly known as: PROSCAR Take 1 tablet (5 mg total) by mouth daily. as directed   hydrochlorothiazide 12.5 MG capsule Commonly known as: MICROZIDE Take 12.5 mg by mouth daily.   memantine 5 MG tablet Commonly known as: NAMENDA Take 5 mg by mouth daily.   metFORMIN 500 MG tablet Commonly known as: GLUCOPHAGE Take 500 mg by  mouth at bedtime.   OVER THE COUNTER MEDICATION Melatonin 3 mg prn Fiber daily Miralax QHS.   pravastatin 80 MG tablet Commonly known as: PRAVACHOL Take 80 mg by mouth daily.   PRESERVISION AREDS PO Take 1 tablet by mouth daily.   multivitamin-lutein Caps capsule Take 1 capsule by mouth in the morning and at bedtime.   silodosin 8 MG Caps capsule Commonly known as: RAPAFLO Take 1 capsule (8 mg total) by mouth in the morning and at bedtime.   timolol 0.5 % ophthalmic solution Commonly known as: TIMOPTIC Apply to eye.        Allergies: No Known Allergies  Family History: Family History  Problem Relation Age of Onset   Heart disease Brother    Colon cancer Neg Hx     Social History:  reports  that he quit smoking about 38 years ago. His smoking use included cigarettes. He has never used smokeless tobacco. He reports that he does not drink alcohol and does not use drugs.  ROS: All other review of systems were reviewed and are negative except what is noted above in HPI  Physical Exam: BP (!) 114/55   Pulse 97   Constitutional:  Alert and oriented, No acute distress. HEENT: [place] AT, moist mucus membranes.  Trachea midline, no masses. Cardiovascular: No clubbing, cyanosis, or edema. Respiratory: Normal respiratory effort, no increased work of breathing. GI: Abdomen is soft, nontender, nondistended, no abdominal masses GU: No CVA tenderness.  Lymph: No cervical or inguinal lymphadenopathy. Skin: No rashes, bruises or suspicious lesions. Neurologic: Grossly intact, no focal deficits, moving all 4 extremities. Psychiatric: Normal mood and affect.  Laboratory Data: Lab Results  Component Value Date   WBC 7.6 09/08/2019   HGB 11.9 (L) 09/08/2019   HCT 35.7 (L) 09/08/2019   MCV 89.5 09/08/2019   PLT 363 09/08/2019    Lab Results  Component Value Date   CREATININE 1.09 09/08/2019    No results found for: "PSA"  No results found for: "TESTOSTERONE"  No results found for: "HGBA1C"  Urinalysis    Component Value Date/Time   COLORURINE YELLOW 06/13/2016 2050   APPEARANCEUR Clear 05/04/2021 1506   LABSPEC 1.020 06/13/2016 2050   PHURINE 5.5 06/13/2016 2050   GLUCOSEU Negative 05/04/2021 [place] 06/13/2016 2050   BILIRUBINUR Negative 05/04/2021 1506   KETONESUR 15 (A) 06/13/2016 2050   PROTEINUR Negative 05/04/2021 [place] 06/13/2016 2050   NITRITE Negative 05/04/2021 1506   NITRITE NEGATIVE 06/13/2016 2050   LEUKOCYTESUR Negative 05/04/2021 1506    Lab Results  Component Value Date   LABMICR Comment 05/04/2021   WBCUA None seen 07/27/2020   LABEPIT None seen 07/27/2020   BACTERIA None seen 07/27/2020    Pertinent  Imaging:  No results found for this or any previous visit.  No results found for this or any previous visit.  No results found for this or any previous visit.  No results found for this or any previous visit.  No results found for this or any previous visit.  No results found for this or any previous visit.  No results found for this or any previous visit.  No results found for this or any previous visit.   Assessment & Plan:    1. Benign prostatic hyperplasia with urinary obstruction -contnue rapaflo 8mg  BID - Urinalysis, Routine w reflex microscopic - BLADDER SCAN AMB NON-IMAGING  2. Nocturia -Rapaflo 8mg  BID  3. Urinary incontinence, unspecified type -we will  trial mirabegron 25mg  daily   No follow-ups on file.  Nicolette Bang, MD  [facility] [place]

## 2022-03-17 NOTE — Progress Notes (Unsigned)
post void residual=12

## 2022-03-19 LAB — URINE CULTURE

## 2022-03-21 ENCOUNTER — Telehealth: Payer: Self-pay

## 2022-03-21 NOTE — Telephone Encounter (Signed)
-----   Message from Cleon Gustin, MD sent at 03/21/2022  9:33 AM EDT ----- negative ----- Message ----- From: Dorisann Frames, RN Sent: 03/21/2022   9:16 AM EDT To: Cleon Gustin, MD  Please review

## 2022-03-21 NOTE — Telephone Encounter (Signed)
Left message for patient to return call to office. 

## 2022-04-06 DIAGNOSIS — Z08 Encounter for follow-up examination after completed treatment for malignant neoplasm: Secondary | ICD-10-CM | POA: Diagnosis not present

## 2022-04-06 DIAGNOSIS — L821 Other seborrheic keratosis: Secondary | ICD-10-CM | POA: Diagnosis not present

## 2022-04-06 DIAGNOSIS — Z1283 Encounter for screening for malignant neoplasm of skin: Secondary | ICD-10-CM | POA: Diagnosis not present

## 2022-04-06 DIAGNOSIS — Z23 Encounter for immunization: Secondary | ICD-10-CM | POA: Diagnosis not present

## 2022-04-06 DIAGNOSIS — Z85820 Personal history of malignant melanoma of skin: Secondary | ICD-10-CM | POA: Diagnosis not present

## 2022-04-13 ENCOUNTER — Ambulatory Visit: Payer: Medicare Other | Admitting: Physician Assistant

## 2022-04-13 VITALS — BP 138/69 | HR 73

## 2022-04-13 DIAGNOSIS — R32 Unspecified urinary incontinence: Secondary | ICD-10-CM | POA: Diagnosis not present

## 2022-04-13 DIAGNOSIS — R351 Nocturia: Secondary | ICD-10-CM

## 2022-04-13 DIAGNOSIS — N138 Other obstructive and reflux uropathy: Secondary | ICD-10-CM

## 2022-04-13 DIAGNOSIS — N401 Enlarged prostate with lower urinary tract symptoms: Secondary | ICD-10-CM

## 2022-04-13 LAB — POCT URINALYSIS DIPSTICK
Bilirubin, UA: NEGATIVE
Blood, UA: NEGATIVE
Glucose, UA: NEGATIVE
Ketones, UA: NEGATIVE
Leukocytes, UA: NEGATIVE
Nitrite, UA: NEGATIVE
Protein, UA: POSITIVE — AB
Spec Grav, UA: 1.025 (ref 1.010–1.025)
Urobilinogen, UA: 1 E.U./dL
pH, UA: 6 (ref 5.0–8.0)

## 2022-04-13 LAB — BLADDER SCAN AMB NON-IMAGING: Scan Result: 49

## 2022-04-13 NOTE — Patient Instructions (Addendum)
Continue Rapaflo twice daily  Continue Myrbetriq daily  Continue Proscar daily

## 2022-04-13 NOTE — Progress Notes (Signed)
2:44 PM  post void residual =49

## 2022-04-13 NOTE — Progress Notes (Signed)
Assessment: 1. Benign prostatic hyperplasia with urinary obstruction - POCT urinalysis dipstick - BLADDER SCAN AMB NON-IMAGING  2. Urinary incontinence, unspecified type  3. Nocturia    Plan: Patient will continue Myrbetriq and silodosin as previously prescribed.  Follow-up in 6 months for UA and PVR, sooner if he has as concerns.    Chief Complaint: No chief complaint on file.   HPI: Jordan [name] is a 88 y.o. male who presents for continued evaluation of BPH with frequency, nocturia, urinary incontinence.  The patient's visit on 9 1, he was placed on Myrbetriq 25 mg daily and continues on Rapaflo 8 mg twice daily. Culture obtained at office visit 3 weeks ago grew few colonies of mixed urogenital flora.  States he has no leakage and denies LUTs. Frequency improved and only 1-2 voids at night.[name] PVR=39ml  03/17/2022 Jordan [name] is a 89yo here for evaluation of urinary incontinence and dysuria. Starting 1 month ago he developed unaware urinary incontinence and dysuria. PVR 12cc. He reamins of rapaflo 8ng daily. Urine stream fair. No straining to urinate.   05/04/2021 Jordan [name] is a 88yo here for followup for BPH with nocturia. IPSS 17 QOl 3. She has urinary frequency every 2 hours. His urine stream is fair. Nocturia 2-3x. No urinary urgency. No straining to urinate. No urinary hesitancy. He has post void dribbling. No other complaints today.    Portions of the above documentation were copied from a prior visit for review purposes only.  Allergies: No Known Allergies  PMH: Past Medical History:  Diagnosis Date   Aortic stenosis    Arthritis    BPH (benign prostatic hypertrophy)    CAD (coronary artery disease)    DES to RCA 2009   Diverticulosis of colon    Essential hypertension    Fecal urgency    History of adenomatous polyp of colon    Tubular adenomas 2016   Hyperlipidemia    Lower urinary tract symptoms (LUTS)    Type 2 diabetes mellitus  ([place])    Wears glasses     PSH: Past Surgical History:  Procedure Laterality Date   BIOPSY  10/02/2019   Procedure: BIOPSY;  Surgeon: Rogene Houston, MD;  [facility];  Service: Endoscopy;;   CATARACT EXTRACTION W/ INTRAOCULAR LENS  IMPLANT, BILATERAL     COLONOSCOPY  last one 11/16/2014   polpectomy   CORONARY ANGIOPLASTY WITH STENT PLACEMENT  12-23-2007  dr hochrein   (abnormal myoview)  PCI w/ DES x1 pRCA (95%),  pCFX 25%,  mCFX 40-50%,  after PDA 30%,  prox, mid, and ostial LAD  30% calcification,  LM diffuse 25% calcification,  ef 60%   EVALUATION UNDER ANESTHESIA WITH ANAL FISTULECTOMY [name] 06/03/2015   Procedure: ANAL EXAM UNDER ANESTHESIA ,FISTULOTOMY ;  Surgeon: [name], MD;  Location: [facility];  Service: General;  Laterality: [name];   FLEXIBLE SIGMOIDOSCOPY [name] 10/02/2019   Procedure: FLEXIBLE SIGMOIDOSCOPY;  Surgeon: Rogene Houston, MD;  [facility];  Service: Endoscopy;  Laterality: [name];  230   INGUINAL HERNIA REPAIR Right 2001   TRANSTHORACIC ECHOCARDIOGRAM  12-06-2007   normal LVF, ef 60%/  mild to moderate  AV calcification without stenosis,  mild AR and TR,  mild LAE    SH: Social History   Tobacco Use   Smoking status: Former    Years: 30.00    Types: Cigarettes    Quit date: 06/01/1983    Years since quitting:  38.8   Smokeless tobacco: Never  Vaping Use   Vaping Use: Never used  Substance Use Topics   Alcohol use: No   Drug use: No    ROS: All other review of systems were reviewed and are negative except what is noted above in HPI  PE: BP 138/69   Pulse 73  GENERAL APPEARANCE:  Well appearing, well developed, well nourished, NAD HEENT:  Atraumatic, normocephalic. Pt is extremely HOH. NECK:  Supple. Trachea midline ABDOMEN:  Soft, non-tender, no masses EXTREMITIES:  Moves all extremities well, without clubbing, cyanosis, or edema NEUROLOGIC:  Alert and oriented x 3, shuffling gait with walker MENTAL  STATUS:  appropriate BACK:  Non-tender to palpation, No CVAT SKIN:  Warm, dry, and intact   Results: Laboratory Data: Lab Results  Component Value Date   WBC 7.6 09/08/2019   HGB 11.9 (L) 09/08/2019   HCT 35.7 (L) 09/08/2019   MCV 89.5 09/08/2019   PLT 363 09/08/2019    Lab Results  Component Value Date   CREATININE 1.09 09/08/2019     Urinalysis    Component Value Date/Time   COLORURINE YELLOW 06/13/2016 2050   APPEARANCEUR Clear 03/17/2022 1240   LABSPEC 1.020 06/13/2016 2050   PHURINE 5.5 06/13/2016 2050   GLUCOSEU Negative 03/17/2022 1240   HGBUR NEGATIVE 06/13/2016 2050   BILIRUBINUR Negative 03/17/2022 1240   KETONESUR 15 (A) 06/13/2016 2050   PROTEINUR Trace (A) 03/17/2022 1240   PROTEINUR NEGATIVE 06/13/2016 2050   NITRITE Negative 03/17/2022 1240   NITRITE NEGATIVE 06/13/2016 2050   LEUKOCYTESUR Negative 03/17/2022 1240    Lab Results  Component Value Date   LABMICR Comment 03/17/2022   WBCUA None seen 07/27/2020   LABEPIT None seen 07/27/2020   BACTERIA None seen 07/27/2020    Pertinent Imaging: No results found for this or any previous visit.  No results found for this or any previous visit.  No results found for this or any previous visit.  No results found for this or any previous visit.  No results found for this or any previous visit.  No valid procedures specified. No results found for this or any previous visit.  No results found for this or any previous visit.  No results found for this or any previous visit (from the past 24 hour(s)).

## 2022-04-23 NOTE — Progress Notes (Unsigned)
Cardiology Office Note  Date: 04/24/2022   ID: Jordan [name], DOB [birth year], MRN [id number]  PCP:  Asencion Noble, MD  Cardiologist:  Rozann Lesches, MD Electrophysiologist:  None   Chief Complaint  Patient presents with   Cardiac follow-up    History of Present Illness: Michiel Sivley Heiberger is a 88 y.o. male last seen in June 2022.  He is here today with his daughter for follow-up visit.  He was referred back to the office by Dr. Willey Blade for follow-up of his aortic stenosis.  Follow-up echocardiogram in December 2022 revealed LVEF greater than 75% with moderate basal septal hypertrophy, mild diastolic dysfunction with normal estimated RVSP, and functionally bicuspid aortic valve with moderate to severe aortic stenosis, mean gradient 28 mmHg and dimensionless index 0.36.  He still lives at home, functional with basic ADLs.  His daughter has observed that he is slower and with less stamina over the last year.  He gets short of breath walking out to the mailbox.  No angina or syncope.  I reviewed his medications which are noted below.  I personally reviewed his ECG today which shows sinus rhythm with right bundle branch block.  Dr. Willey Blade discussed question of TAVR with the patient and his daughter.  Past Medical History:  Diagnosis Date   Aortic stenosis    Arthritis    BPH (benign prostatic hypertrophy)    CAD (coronary artery disease)    DES to RCA 2009   Diverticulosis of colon    Essential hypertension    Fecal urgency    History of adenomatous polyp of colon    Tubular adenomas 2016   Hyperlipidemia    Lower urinary tract symptoms (LUTS)    Type 2 diabetes mellitus ([place])    Wears glasses     Past Surgical History:  Procedure Laterality Date   BIOPSY  10/02/2019   Procedure: BIOPSY;  Surgeon: Rogene Houston, MD;  [facility];  Service: Endoscopy;;   CATARACT EXTRACTION W/ INTRAOCULAR LENS  IMPLANT, BILATERAL     COLONOSCOPY  last one 11/16/2014    polpectomy   CORONARY ANGIOPLASTY WITH STENT PLACEMENT  12-23-2007  dr hochrein   (abnormal myoview)  PCI w/ DES x1 pRCA (95%),  pCFX 25%,  mCFX 40-50%,  after PDA 30%,  prox, mid, and ostial LAD  30% calcification,  LM diffuse 25% calcification,  ef 60%   EVALUATION UNDER ANESTHESIA WITH ANAL FISTULECTOMY [name] 06/03/2015   Procedure: ANAL EXAM UNDER ANESTHESIA ,FISTULOTOMY ;  Surgeon: [name], MD;  Location: [facility];  Service: General;  Laterality: [name];   FLEXIBLE SIGMOIDOSCOPY [name] 10/02/2019   Procedure: FLEXIBLE SIGMOIDOSCOPY;  Surgeon: Rogene Houston, MD;  [facility];  Service: Endoscopy;  Laterality: [name];  230   INGUINAL HERNIA REPAIR Right 2001   TRANSTHORACIC ECHOCARDIOGRAM  12-06-2007   normal LVF, ef 60%/  mild to moderate  AV calcification without stenosis,  mild AR and TR,  mild LAE    Current Outpatient Medications  Medication Sig Dispense Refill   Accu-Chek FastClix Lancets MISC USE 1 TO CHECK GLUCOSE ONCE DAILY     ACCU-CHEK GUIDE test strip USE 1 STRIP TO CHECK GLUCOSE ONCE DAILY     aspirin 81 MG tablet Take 81 mg by mouth every evening. 30 tablet    budesonide (ENTOCORT EC) 3 MG 24 hr capsule Take 1 capsule (3 mg total) by mouth daily. 90 capsule 3   enalapril (VASOTEC) 10  MG tablet Take 10 mg by mouth daily.     finasteride (PROSCAR) 5 MG tablet Take 1 tablet (5 mg total) by mouth daily. as directed 90 tablet 3   glucose blood (ACCU-CHEK GUIDE) test strip Accu-Chek Guide test strips  USE 1 STRIP TO CHECK GLUCOSE ONCE DAILY     hydrochlorothiazide (MICROZIDE) 12.5 MG capsule Take 12.5 mg by mouth daily.     memantine (NAMENDA) 5 MG tablet Take 5 mg by mouth daily.   11   metFORMIN (GLUCOPHAGE) 500 MG tablet Take 500 mg by mouth at bedtime.      mirabegron ER (MYRBETRIQ) 25 MG TB24 tablet Take 1 tablet (25 mg total) by mouth daily. 30 tablet 0   Multiple Vitamins-Minerals (PRESERVISION AREDS PO) Take 1 tablet by mouth daily.      multivitamin-lutein (OCUVITE-LUTEIN) CAPS capsule Take 1 capsule by mouth in the morning and at bedtime.     OVER THE COUNTER MEDICATION Melatonin 3 mg prn Fiber daily Miralax QHS.     pravastatin (PRAVACHOL) 80 MG tablet Take 80 mg by mouth daily.     silodosin (RAPAFLO) 8 MG CAPS capsule Take 1 capsule (8 mg total) by mouth in the morning and at bedtime. 180 capsule 3   timolol (TIMOPTIC) 0.5 % ophthalmic solution Apply to eye.     No current facility-administered medications for this visit.   Allergies:  Patient has no known allergies.   ROS: Hearing loss.  Physical Exam: VS:  BP (!) 142/60   Pulse 71   Ht 5\' 8"  (1.727 m)   Wt 126 lb 6.4 oz (57.3 kg)   SpO2 97%   BMI 19.22 kg/m , BMI Body mass index is 19.22 kg/m.  Wt Readings from Last 3 Encounters:  04/24/22 126 lb 6.4 oz (57.3 kg)  04/18/21 137 lb 14.4 oz (62.6 kg)  01/10/21 144 lb (65.3 kg)    General: Patient appears comfortable at rest. HEENT: Conjunctiva and lids normal. Neck: Supple, no elevated JVP or carotid bruits. Lungs: Clear to auscultation, nonlabored breathing at rest. Cardiac: Regular rate and rhythm, no S3, 3/6 systolic murmur, no pericardial rub. Abdomen: Soft, bowel sounds present. Extremities: No pitting edema, distal pulses 2+.  ECG:  An ECG dated 03/17/2022 was personally reviewed today and demonstrated:  Sinus rhythm with right bundle branch block, left anterior fascicular block, and PVCs.  Recent Labwork:  August 2023: BUN 43, creatinine 1.12, potassium 4.3, hemoglobin A1c 7.1%  Other Studies Reviewed Today:  Echocardiogram 07/12/2021:  1. Left ventricular ejection fraction, by estimation, is >75%. The left  ventricle has hyperdynamic function. The left ventricle has no regional  wall motion abnormalities. There is moderate asymmetric left ventricular  hypertrophy of the basal-septal  segment. Left ventricular diastolic parameters are consistent with Grade I  diastolic dysfunction (impaired  relaxation). Elevated left ventricular  end-diastolic pressure. The E/e' is 29.   2. Right ventricular systolic function is normal. The right ventricular  size is normal. There is normal pulmonary artery systolic pressure. The  estimated right ventricular systolic pressure is 71.2 mmHg.   3. The mitral valve is abnormal. Trivial mitral valve regurgitation.  Moderate mitral annular calcification.   4. The aortic valve appears functionally bicuspid. There is moderate  calcification of the aortic valve. Aortic valve regurgitation is mild.  Moderate to severe aortic valve stenosis. Aortic regurgitation PHT  measures 460 msec. Aortic valve area, by VTI  measures 1.13 cm. Aortic valve mean gradient measures 28.0 mmHg. Aortic  valve  Vmax measures 3.33 m/s. DI is 0.36.  Assessment and Plan:  1.  Calcific aortic stenosis, moderate to severe with functionally bicuspid aortic valve and mean gradient 28 mmHg (dimensionless index 0.36) as of December 2022.  He has had functional decline over the last several months, likely has had progressive valvular disease, but multifactorial at age 59 as well.  We will get a follow-up echocardiogram.  They did inquire about evaluation for TAVR and we can certainly consider referral to the structural heart team at least for discussion.  I did have a fairly frank discussion with him today regarding relative risk and also diminishing returns with this type of invasive procedure at very advanced age.  2.  CAD status post DES to the RCA in 2009.  ECG reviewed showing sinus rhythm with right bundle branch block and left anterior fascicular block, old.  Continue aspirin, Vasotec, and Pravachol.  Medication Adjustments/Labs and Tests Ordered: Current medicines are reviewed at length with the patient today.  Concerns regarding medicines are outlined above.   Tests Ordered: Orders Placed This Encounter  Procedures   EKG 12-Lead   ECHOCARDIOGRAM COMPLETE    Medication  Changes: No orders of the defined types were placed in this encounter.   Disposition:  Follow up  test results.  Signed, Satira Sark, MD, [facility] 04/24/2022 12:27 PM    [facility] [place], [place], [place] [address] Phone: [phone]; Fax: [phone]

## 2022-04-24 ENCOUNTER — Encounter: Payer: Self-pay | Admitting: Cardiology

## 2022-04-24 ENCOUNTER — Ambulatory Visit: Payer: Medicare Other | Attending: Cardiology | Admitting: Cardiology

## 2022-04-24 DIAGNOSIS — I25119 Atherosclerotic heart disease of native coronary artery with unspecified angina pectoris: Secondary | ICD-10-CM | POA: Diagnosis not present

## 2022-04-24 DIAGNOSIS — I350 Nonrheumatic aortic (valve) stenosis: Secondary | ICD-10-CM | POA: Diagnosis not present

## 2022-04-24 DIAGNOSIS — I10 Essential (primary) hypertension: Secondary | ICD-10-CM

## 2022-04-24 NOTE — Patient Instructions (Addendum)
Medication Instructions:  Your physician recommends that you continue on your current medications as directed. Please refer to the Current Medication list given to you today.   Labwork: none  Testing/Procedures: Your physician has requested that you have an echocardiogram. Echocardiography is a painless test that uses sound waves to create images of your heart. It provides your doctor with information about the size and shape of your heart and how well your heart's chambers and valves are working. This procedure takes approximately one hour. There are no restrictions for this procedure.   Follow-Up:  Your physician recommends that you schedule a follow-up appointment in: Pending  Any Other Special Instructions Will Be Listed Below (If Applicable).  If you need a refill on your cardiac medications before your next appointment, please call your pharmacy.

## 2022-05-02 ENCOUNTER — Other Ambulatory Visit: Payer: Medicare Other

## 2022-05-03 ENCOUNTER — Ambulatory Visit: Payer: Medicare Other | Attending: Cardiology

## 2022-05-03 DIAGNOSIS — I350 Nonrheumatic aortic (valve) stenosis: Secondary | ICD-10-CM

## 2022-05-04 LAB — ECHOCARDIOGRAM COMPLETE
AR max vel: 1.02 cm2
AV Area VTI: 1.01 cm2
AV Area mean vel: 0.97 cm2
AV Mean grad: 23.2 mmHg
AV Peak grad: 38.5 mmHg
AV Vena cont: 0.45 cm
Ao pk vel: 3.1 m/s
Area-P 1/2: 1.96 cm2
Calc EF: 61.1 %
MV M vel: 3 m/s
MV Peak grad: 36 mmHg
P 1/2 time: 605 msec
S' Lateral: 2.37 cm
Single Plane A2C EF: 61.7 %
Single Plane A4C EF: 60.9 %

## 2022-05-05 ENCOUNTER — Encounter: Payer: Self-pay | Admitting: Cardiology

## 2022-05-05 ENCOUNTER — Telehealth: Payer: Self-pay | Admitting: *Deleted

## 2022-05-05 DIAGNOSIS — I350 Nonrheumatic aortic (valve) stenosis: Secondary | ICD-10-CM

## 2022-05-05 NOTE — Telephone Encounter (Signed)
Patient informed and verbalized understanding of plan. Copy sent to PCP 

## 2022-05-05 NOTE — Telephone Encounter (Signed)
-----   Message from Satira Sark, MD sent at 05/04/2022  2:15 PM EDT ----- Results reviewed.  Aortic stenosis is relatively stable, moderate to severe range with mean gradient 23 mmHg and dimensionless index 0.32.  As per recent office note discussion, we could offer a consultation with the structural heart team for discussion of TAVR, although this may ultimately be for informational purposes and discussion as a way of understanding reasonable options for treatment versus conservative management at this point.

## 2022-05-05 NOTE — Telephone Encounter (Signed)
Error

## 2022-05-10 ENCOUNTER — Ambulatory Visit: Payer: Medicare Other | Admitting: Urology

## 2022-05-11 NOTE — Progress Notes (Signed)
Structural Heart Clinic Consult Note  Chief Complaint  Patient presents with   New Patient (Initial Visit)    Aortic stenosis   History of Present Illness: 88 yo male with history of arthritis, BPH, CAD, HTN, hyperlipidemia, diabetes and severe aortic stenosis who is here today as a new consult, referred by Dr. Domenic Polite, for further discussion regarding his aortic stenosis and candidacy for TAVR. He has CAD and had a drug eluting stent placed in the RCA in 2009. He has been followed for moderate AS. Echo 05/03/22 with LVEF=60-65%, moderate LVH. The aortic valve is thickened and calcified with at least moderate stenosis, mean gradient 23 mmHg, AVA 1.0 cm2, DI 0.32. He is limited in his mobility and uses a walker. He was able to walk back to the exam room very slowly from the waiting room with his walker. His daughter is here with him today.   He tells me today that he is very comfortable during the day. He only ambulates from room to room. He has no dyspnea while sitting. He does have mild dyspnea if he walks for several minutes. No chest pain, dizziness, near syncope or LE edema. He lives in [place], [place] alone but his daughter does his cooking and cleaning. He is a retired from [employer].   [place]: Asencion Noble, MD Primary Cardiologist: Domenic Polite Referring Cardiologist: Domenic Polite  Past Medical History:  Diagnosis Date   Aortic stenosis    Arthritis    BPH (benign prostatic hypertrophy)    CAD (coronary artery disease)    DES to RCA 2009   Diverticulosis of colon    Essential hypertension    Fecal urgency    History of adenomatous polyp of colon    Tubular adenomas 2016   Hyperlipidemia    Lower urinary tract symptoms (LUTS)    Type 2 diabetes mellitus ([place])    Wears glasses     Past Surgical History:  Procedure Laterality Date   BIOPSY  10/02/2019   Procedure: BIOPSY;  Surgeon: Rogene Houston, MD;  [facility];  Service: Endoscopy;;    CATARACT EXTRACTION W/ INTRAOCULAR LENS  IMPLANT, BILATERAL     COLONOSCOPY  last one 11/16/2014   polpectomy   CORONARY ANGIOPLASTY WITH STENT PLACEMENT  12-23-2007  dr hochrein   (abnormal myoview)  PCI w/ DES x1 pRCA (95%),  pCFX 25%,  mCFX 40-50%,  after PDA 30%,  prox, mid, and ostial LAD  30% calcification,  LM diffuse 25% calcification,  ef 60%   EVALUATION UNDER ANESTHESIA WITH ANAL FISTULECTOMY [name] 06/03/2015   Procedure: ANAL EXAM UNDER ANESTHESIA ,FISTULOTOMY ;  Surgeon: [name], MD;  Location: [facility];  Service: General;  Laterality: [name];   FLEXIBLE SIGMOIDOSCOPY [name] 10/02/2019   Procedure: FLEXIBLE SIGMOIDOSCOPY;  Surgeon: Rogene Houston, MD;  [facility];  Service: Endoscopy;  Laterality: [name];  230   INGUINAL HERNIA REPAIR Right 2001   TRANSTHORACIC ECHOCARDIOGRAM  12-06-2007   normal LVF, ef 60%/  mild to moderate  AV calcification without stenosis,  mild AR and TR,  mild LAE    Current Outpatient Medications  Medication Sig Dispense Refill   Accu-Chek FastClix Lancets MISC USE 1 TO CHECK GLUCOSE ONCE DAILY     ACCU-CHEK GUIDE test strip USE 1 STRIP TO CHECK GLUCOSE ONCE DAILY     aspirin 81 MG tablet Take 81 mg by mouth every evening. 30 tablet    budesonide (ENTOCORT EC) 3 MG  24 hr capsule Take 1 capsule (3 mg total) by mouth daily. 90 capsule 3   enalapril (VASOTEC) 10 MG tablet Take 10 mg by mouth daily.     finasteride (PROSCAR) 5 MG tablet Take 1 tablet (5 mg total) by mouth daily. as directed 90 tablet 3   glucose blood (ACCU-CHEK GUIDE) test strip Accu-Chek Guide test strips  USE 1 STRIP TO CHECK GLUCOSE ONCE DAILY     memantine (NAMENDA) 5 MG tablet Take 5 mg by mouth daily.   11   metFORMIN (GLUCOPHAGE) 500 MG tablet Take 500 mg by mouth at bedtime.      mirabegron ER (MYRBETRIQ) 25 MG TB24 tablet Take 1 tablet (25 mg total) by mouth daily. 30 tablet 0   Multiple Vitamins-Minerals (PRESERVISION AREDS PO) Take 1 tablet by mouth  daily.     multivitamin-lutein (OCUVITE-LUTEIN) CAPS capsule Take 1 capsule by mouth in the morning and at bedtime.     OVER THE COUNTER MEDICATION Melatonin 3 mg prn Fiber daily Miralax QHS.     pravastatin (PRAVACHOL) 80 MG tablet Take 80 mg by mouth daily.     silodosin (RAPAFLO) 8 MG CAPS capsule Take 1 capsule (8 mg total) by mouth in the morning and at bedtime. 180 capsule 3   timolol (TIMOPTIC) 0.5 % ophthalmic solution Apply to eye.     No current facility-administered medications for this visit.    No Known Allergies  Social History   Socioeconomic History   Marital status: Widowed    Spouse name: Not on file   Number of children: 2   Years of education: Not on file   Highest education level: Not on file  Occupational History   Occupation: Retired as Associate Professor of Optometrist tobacco  Tobacco Use   Smoking status: Former    Years: 30.00    Types: Cigarettes    Quit date: 06/01/1983    Years since quitting: 38.9   Smokeless tobacco: Never  Vaping Use   Vaping Use: Never used  Substance and Sexual Activity   Alcohol use: No   Drug use: No   Sexual activity: Not on file  Other Topics Concern   Not on file  Social History Narrative   Not on file   Social Determinants of Health   Financial Resource Strain: Not on file  Food Insecurity: Not on file  Transportation Needs: Not on file  Physical Activity: Not on file  Stress: Not on file  Social Connections: Not on file  Intimate Partner Violence: Not on file    Family History  Problem Relation Age of Onset   Heart disease Brother    Colon cancer Neg Hx     Review of Systems:  As stated in the HPI and otherwise negative.   BP 138/60   Pulse 75   Ht 5\' 8"  (1.727 m)   Wt 129 lb 9.6 oz (58.8 kg)   SpO2 97%   BMI 19.71 kg/m   Physical Examination: General: Well developed, well nourished, NAD  HEENT: OP clear, mucus membranes moist  SKIN: warm, dry. No rashes. Neuro: No focal deficits   Musculoskeletal: Muscle strength 5/5 all ext  Psychiatric: Mood and affect normal  Neck: No JVD, no carotid bruits, no thyromegaly, no lymphadenopathy.  Lungs:Clear bilaterally, no wheezes, rhonci, crackles Cardiovascular: Regular rate and rhythm. Loud, harsh, late peaking systolic murmur.  Abdomen:Soft. Bowel sounds present. Non-tender.  Extremities: No lower extremity edema. Pulses are 2 + in the bilateral DP/PT.  Echo  05/03/22:  1. Left ventricular ejection fraction, by estimation, is 60 to 65%. The  left ventricle has normal function. The left ventricle has no regional  wall motion abnormalities. There is moderate left ventricular hypertrophy.  Left ventricular diastolic  parameters are consistent with Grade I diastolic dysfunction (impaired  relaxation). Elevated left atrial pressure.   2. Right ventricular systolic function is normal. The right ventricular  size is normal.   3. The mitral valve is normal in structure. No evidence of mitral valve  regurgitation. No evidence of mitral stenosis.   4. The tricuspid valve is abnormal.   5. The aortic valve is tricuspid. There is severe calcifcation of the  aortic valve. There is severe thickening of the aortic valve. Aortic valve  regurgitation is moderate. Moderate aortic valve stenosis. Aortic valve  mean gradient measures 23.2 mmHg.  Aortic valve peak gradient measures 38.5 mmHg. Aortic valve area, by VTI  measures 1.01 cm.   6. The inferior vena cava is normal in size with greater than 50%  respiratory variability, suggesting right atrial pressure of 3 mmHg.   Comparison(s): Echocardiogram done 07/12/21 showed an EF of >75% with  moderate to severe AS and an AV Mean Grad of 28 mmHg.    Left Ventricle: Left ventricular ejection fraction, by estimation, is 60  to 65%. The left ventricle has normal function. The left ventricle has no  regional wall motion abnormalities. The left ventricular internal cavity  size was normal in  size. There is   moderate left ventricular hypertrophy. Left ventricular diastolic  parameters are consistent with Grade I diastolic dysfunction (impaired  relaxation). Elevated left atrial pressure.   Right Ventricle: The right ventricular size is normal. Right vetricular  wall thickness was not well visualized. Right ventricular systolic  function is normal.   Left Atrium: Left atrial size was normal in size.   Right Atrium: Right atrial size was normal in size.   Pericardium: There is no evidence of pericardial effusion.   Mitral Valve: The mitral valve is normal in structure. No evidence of  mitral valve regurgitation. No evidence of mitral valve stenosis.   Tricuspid Valve: The tricuspid valve is abnormal. Tricuspid valve  regurgitation is mild . No evidence of tricuspid stenosis.   Aortic Valve: The aortic valve is tricuspid. There is severe calcifcation  of the aortic valve. There is severe thickening of the aortic valve. There  is severe aortic valve annular calcification. Aortic valve regurgitation  is moderate. Aortic  regurgitation PHT measures 605 msec. Moderate aortic stenosis is present.  Aortic valve mean gradient measures 23.2 mmHg. Aortic valve peak gradient  measures 38.5 mmHg. Aortic valve area, by VTI measures 1.01 cm.   Pulmonic Valve: The pulmonic valve was not well visualized. Pulmonic valve  regurgitation is not visualized. No evidence of pulmonic stenosis.   Aorta: The aortic root is normal in size and structure.   Venous: The inferior vena cava is normal in size with greater than 50%  respiratory variability, suggesting right atrial pressure of 3 mmHg.   IAS/Shunts: No atrial level shunt detected by color flow Doppler.      LEFT VENTRICLE  PLAX 2D  LVIDd:         3.62 cm     Diastology  LVIDs:         2.37 cm     LV e' medial:    3.70 cm/s  LV PW:         1.20 cm  LV E/e' medial:  22.2  LV IVS:        1.32 cm     LV e' lateral:   5.22 cm/s   LVOT diam:     2.00 cm     LV E/e' lateral: 15.7  LV SV:         75  LV SV Index:   44  LVOT Area:     3.14 cm     LV Volumes (MOD)  LV vol d, MOD A2C: 54.6 ml  LV vol d, MOD A4C: 62.7 ml  LV vol s, MOD A2C: 20.9 ml  LV vol s, MOD A4C: 24.5 ml  LV SV MOD A2C:     33.7 ml  LV SV MOD A4C:     62.7 ml  LV SV MOD BP:      35.9 ml   RIGHT VENTRICLE  RV S prime:     12.10 cm/s  TAPSE (M-mode): 2.3 cm   LEFT ATRIUM             Index        RIGHT ATRIUM           Index  LA diam:        3.20 cm 1.90 cm/m   RA Area:     13.80 cm  LA Vol (A2C):   51.2 ml 30.45 ml/m  RA Volume:   33.60 ml  19.98 ml/m  LA Vol (A4C):   54.1 ml 32.17 ml/m  LA Biplane Vol: 52.9 ml 31.46 ml/m   AORTIC VALVE  AV Area (Vmax):    1.02 cm  AV Area (Vmean):   0.97 cm  AV Area (VTI):     1.01 cm  AV Vmax:           310.40 cm/s  AV Vmean:          229.200 cm/s  AV VTI:            0.739 m  AV Peak Grad:      38.5 mmHg  AV Mean Grad:      23.2 mmHg  LVOT Vmax:         101.00 cm/s  LVOT Vmean:        70.500 cm/s  LVOT VTI:          0.238 m  LVOT/AV VTI ratio: 0.32  AI PHT:            605 msec  AR Vena Contracta: 0.45 cm     AORTA  Ao Root diam: 3.40 cm   MITRAL VALVE                TRICUSPID VALVE  MV Area (PHT): 1.96 cm     TR Peak grad:   25.0 mmHg  MV Decel Time: 388 msec     TR Vmax:        250.00 cm/s  MR Peak grad: 36.0 mmHg  MR Vmax:      300.00 cm/s   SHUNTS  MV E velocity: 82.00 cm/s   Systemic VTI:  0.24 m  MV A velocity: 154.00 cm/s  Systemic Diam: 2.00 cm  MV E/A ratio:  0.53   Recent Labs: No results found for requested labs within last 365 days.   Lipid Panel No results found for: "CHOL", "TRIG", "HDL", "CHOLHDL", "VLDL", "LDLCALC", "LDLDIRECT"   Wt Readings from Last 3 Encounters:  05/12/22 129 lb 9.6 oz (58.8 kg)  04/24/22 126 lb 6.4 oz (57.3  kg)  04/18/21 137 lb 14.4 oz (62.6 kg)     Assessment and Plan:   1. Severe Aortic Valve Stenosis: He has moderate to severe  aortic valve stenosis. I have personally reviewed the echo images. The aortic valve is thickened and calcified but the leaflets seem to open. His measurements are consistent with moderate stenosis and this is consistent with the visual appearance of his valve. I would not consider TAVR at this point given his advanced age and degree of AS. I do not think TAVR would improve his overall quality of life and in fact may shorten his life if there were complications with the procedure. The patient and his daughter understand this. I would not repeat his echo as he is not a candidate for future invasive cardiac procedures given age and functional status.   Labs/ tests ordered today include:  No orders of the defined types were placed in this encounter.  Disposition:   F/U will not be arranged in the structural heart clinic.  Signed, Lauree Chandler, MD, [facility] 05/12/2022 2:12 PM    [facility], [place], [place]  [address] Phone: [phone]; Fax: [phone]

## 2022-05-12 ENCOUNTER — Ambulatory Visit: Payer: Medicare Other | Attending: Cardiovascular Disease | Admitting: Cardiovascular Disease

## 2022-05-12 ENCOUNTER — Encounter: Payer: Self-pay | Admitting: Cardiovascular Disease

## 2022-05-12 VITALS — BP 138/60 | HR 75 | Ht 68.0 in | Wt 129.6 lb

## 2022-05-12 DIAGNOSIS — I350 Nonrheumatic aortic (valve) stenosis: Secondary | ICD-10-CM | POA: Diagnosis not present

## 2022-05-12 NOTE — Patient Instructions (Signed)
Medication Instructions:  No changes *If you need a refill on your cardiac medications before your next appointment, please call your pharmacy*   Lab Work: none If you have labs (blood work) drawn today and your tests are completely normal, you will receive your results only by: [place] (if you have MyChart) OR A paper copy in the mail If you have any lab test that is abnormal or we need to change your treatment, we will call you to review the results.   Testing/Procedures: none   Follow-Up: As planned with Dr. Domenic Polite

## 2022-05-15 ENCOUNTER — Telehealth: Payer: Self-pay

## 2022-05-15 NOTE — Telephone Encounter (Signed)
Made daughter aware that dad will continued taking Myrbetriq 25mg  and I have put samples up front for her to pick up at her earliest convince. Daughter voiced understanding.

## 2022-05-15 NOTE — Telephone Encounter (Signed)
Patient's daughter left a voice message 05-15-2022.  Wants to know if pt needs to keep taking mirabegron ER [facility]) ?  If so, almost done with samples.  Please let daughter know if she needs to come by and pick up samples.  Call back:  [phone]  Thanks, Helene Kelp

## 2022-06-09 ENCOUNTER — Other Ambulatory Visit (INDEPENDENT_AMBULATORY_CARE_PROVIDER_SITE_OTHER): Payer: Self-pay | Admitting: Gastroenterology

## 2022-06-09 DIAGNOSIS — K52832 Lymphocytic colitis: Secondary | ICD-10-CM

## 2022-06-12 NOTE — Telephone Encounter (Signed)
Will refill medication for 3 months, needs follow up appointment with any provider in order to receive any refills.  Thanks,  Winfrey Chillemi Castaneda, MD Gastroenterology and Hepatology [facility]  

## 2022-06-15 DIAGNOSIS — N1831 Chronic kidney disease, stage 3a: Secondary | ICD-10-CM | POA: Diagnosis not present

## 2022-06-15 DIAGNOSIS — G301 Alzheimer's disease with late onset: Secondary | ICD-10-CM | POA: Diagnosis not present

## 2022-06-15 DIAGNOSIS — E1129 Type 2 diabetes mellitus with other diabetic kidney complication: Secondary | ICD-10-CM | POA: Diagnosis not present

## 2022-06-15 DIAGNOSIS — I10 Essential (primary) hypertension: Secondary | ICD-10-CM | POA: Diagnosis not present

## 2022-06-20 ENCOUNTER — Encounter (HOSPITAL_COMMUNITY): Payer: Self-pay | Admitting: Radiology

## 2022-06-20 ENCOUNTER — Inpatient Hospital Stay (HOSPITAL_COMMUNITY)
Admit: 2022-06-20 | Discharge: 2022-06-20 | Disposition: A | Discharge status: Home / Self Care | Payer: Medicare Other | Attending: Family Medicine | Admitting: Family Medicine

## 2022-06-20 ENCOUNTER — Emergency Department (HOSPITAL_COMMUNITY): Payer: Medicare Other

## 2022-06-20 ENCOUNTER — Inpatient Hospital Stay (HOSPITAL_COMMUNITY): Payer: Medicare Other

## 2022-06-20 ENCOUNTER — Emergency Department (EMERGENCY_DEPARTMENT_HOSPITAL)
Admission: EM | Admit: 2022-06-20 | Discharge: 2022-06-20 | Disposition: A | Discharge status: Home / Self Care | Payer: Medicare Other | Source: Home / Self Care | Attending: Emergency Medicine | Admitting: Emergency Medicine

## 2022-06-20 DIAGNOSIS — G9341 Metabolic encephalopathy: Secondary | ICD-10-CM | POA: Diagnosis not present

## 2022-06-20 DIAGNOSIS — I10 Essential (primary) hypertension: Secondary | ICD-10-CM | POA: Diagnosis not present

## 2022-06-20 DIAGNOSIS — R627 Adult failure to thrive: Secondary | ICD-10-CM | POA: Diagnosis not present

## 2022-06-20 DIAGNOSIS — R918 Other nonspecific abnormal finding of lung field: Secondary | ICD-10-CM | POA: Diagnosis not present

## 2022-06-20 DIAGNOSIS — R2689 Other abnormalities of gait and mobility: Secondary | ICD-10-CM | POA: Diagnosis present

## 2022-06-20 DIAGNOSIS — E119 Type 2 diabetes mellitus without complications: Secondary | ICD-10-CM | POA: Insufficient documentation

## 2022-06-20 DIAGNOSIS — K52832 Lymphocytic colitis: Secondary | ICD-10-CM | POA: Diagnosis present

## 2022-06-20 DIAGNOSIS — C3490 Malignant neoplasm of unspecified part of unspecified bronchus or lung: Secondary | ICD-10-CM | POA: Diagnosis not present

## 2022-06-20 DIAGNOSIS — J929 Pleural plaque without asbestos: Secondary | ICD-10-CM | POA: Diagnosis not present

## 2022-06-20 DIAGNOSIS — N400 Enlarged prostate without lower urinary tract symptoms: Secondary | ICD-10-CM | POA: Diagnosis not present

## 2022-06-20 DIAGNOSIS — R531 Weakness: Secondary | ICD-10-CM | POA: Diagnosis not present

## 2022-06-20 DIAGNOSIS — I6523 Occlusion and stenosis of bilateral carotid arteries: Secondary | ICD-10-CM | POA: Diagnosis not present

## 2022-06-20 DIAGNOSIS — Z87891 Personal history of nicotine dependence: Secondary | ICD-10-CM | POA: Diagnosis not present

## 2022-06-20 DIAGNOSIS — J432 Centrilobular emphysema: Secondary | ICD-10-CM | POA: Diagnosis not present

## 2022-06-20 DIAGNOSIS — G319 Degenerative disease of nervous system, unspecified: Secondary | ICD-10-CM | POA: Diagnosis not present

## 2022-06-20 DIAGNOSIS — R29810 Facial weakness: Secondary | ICD-10-CM | POA: Diagnosis not present

## 2022-06-20 DIAGNOSIS — I4891 Unspecified atrial fibrillation: Secondary | ICD-10-CM | POA: Diagnosis not present

## 2022-06-20 DIAGNOSIS — Z7982 Long term (current) use of aspirin: Secondary | ICD-10-CM | POA: Insufficient documentation

## 2022-06-20 DIAGNOSIS — R410 Disorientation, unspecified: Secondary | ICD-10-CM | POA: Diagnosis not present

## 2022-06-20 DIAGNOSIS — Z79899 Other long term (current) drug therapy: Secondary | ICD-10-CM | POA: Insufficient documentation

## 2022-06-20 DIAGNOSIS — Z7984 Long term (current) use of oral hypoglycemic drugs: Secondary | ICD-10-CM | POA: Insufficient documentation

## 2022-06-20 DIAGNOSIS — F03918 Unspecified dementia, unspecified severity, with other behavioral disturbance: Secondary | ICD-10-CM | POA: Diagnosis present

## 2022-06-20 DIAGNOSIS — R29818 Other symptoms and signs involving the nervous system: Secondary | ICD-10-CM | POA: Insufficient documentation

## 2022-06-20 DIAGNOSIS — E785 Hyperlipidemia, unspecified: Secondary | ICD-10-CM | POA: Diagnosis not present

## 2022-06-20 DIAGNOSIS — R636 Underweight: Secondary | ICD-10-CM | POA: Diagnosis not present

## 2022-06-20 DIAGNOSIS — G9340 Encephalopathy, unspecified: Secondary | ICD-10-CM

## 2022-06-20 DIAGNOSIS — Z8249 Family history of ischemic heart disease and other diseases of the circulatory system: Secondary | ICD-10-CM | POA: Diagnosis not present

## 2022-06-20 DIAGNOSIS — I350 Nonrheumatic aortic (valve) stenosis: Secondary | ICD-10-CM | POA: Diagnosis not present

## 2022-06-20 DIAGNOSIS — R4182 Altered mental status, unspecified: Secondary | ICD-10-CM | POA: Diagnosis not present

## 2022-06-20 DIAGNOSIS — Z66 Do not resuscitate: Secondary | ICD-10-CM | POA: Diagnosis not present

## 2022-06-20 DIAGNOSIS — Z7189 Other specified counseling: Secondary | ICD-10-CM | POA: Diagnosis not present

## 2022-06-20 DIAGNOSIS — Z681 Body mass index (BMI) 19 or less, adult: Secondary | ICD-10-CM | POA: Diagnosis not present

## 2022-06-20 DIAGNOSIS — I4510 Unspecified right bundle-branch block: Secondary | ICD-10-CM | POA: Diagnosis not present

## 2022-06-20 DIAGNOSIS — R509 Fever, unspecified: Secondary | ICD-10-CM | POA: Diagnosis not present

## 2022-06-20 DIAGNOSIS — M1990 Unspecified osteoarthritis, unspecified site: Secondary | ICD-10-CM | POA: Diagnosis present

## 2022-06-20 DIAGNOSIS — R456 Violent behavior: Secondary | ICD-10-CM | POA: Diagnosis not present

## 2022-06-20 DIAGNOSIS — E118 Type 2 diabetes mellitus with unspecified complications: Secondary | ICD-10-CM | POA: Diagnosis not present

## 2022-06-20 DIAGNOSIS — N138 Other obstructive and reflux uropathy: Secondary | ICD-10-CM | POA: Diagnosis present

## 2022-06-20 DIAGNOSIS — Z955 Presence of coronary angioplasty implant and graft: Secondary | ICD-10-CM | POA: Diagnosis not present

## 2022-06-20 DIAGNOSIS — I7143 Infrarenal abdominal aortic aneurysm, without rupture: Secondary | ICD-10-CM | POA: Diagnosis not present

## 2022-06-20 DIAGNOSIS — Z515 Encounter for palliative care: Secondary | ICD-10-CM | POA: Diagnosis not present

## 2022-06-20 DIAGNOSIS — I2510 Atherosclerotic heart disease of native coronary artery without angina pectoris: Secondary | ICD-10-CM | POA: Insufficient documentation

## 2022-06-20 DIAGNOSIS — J90 Pleural effusion, not elsewhere classified: Secondary | ICD-10-CM | POA: Diagnosis not present

## 2022-06-20 LAB — DIFFERENTIAL
Abs Immature Granulocytes: 0.02 10*3/uL (ref 0.00–0.07)
Basophils Absolute: 0 10*3/uL (ref 0.0–0.1)
Basophils Relative: 1 %
Eosinophils Absolute: 0.1 10*3/uL (ref 0.0–0.5)
Eosinophils Relative: 1 %
Immature Granulocytes: 0 %
Lymphocytes Relative: 20 %
Lymphs Abs: 1.3 10*3/uL (ref 0.7–4.0)
Monocytes Absolute: 0.6 10*3/uL (ref 0.1–1.0)
Monocytes Relative: 9 %
Neutro Abs: 4.5 10*3/uL (ref 1.7–7.7)
Neutrophils Relative %: 69 %

## 2022-06-20 LAB — COMPREHENSIVE METABOLIC PANEL
ALT: 21 U/L (ref 0–44)
AST: 21 U/L (ref 15–41)
Albumin: 3.4 g/dL — ABNORMAL LOW (ref 3.5–5.0)
Alkaline Phosphatase: 103 U/L (ref 38–126)
Anion gap: 9 (ref 5–15)
BUN: 32 mg/dL — ABNORMAL HIGH (ref 8–23)
CO2: 27 mmol/L (ref 22–32)
Calcium: 9 mg/dL (ref 8.9–10.3)
Chloride: 102 mmol/L (ref 98–111)
Creatinine, Ser: 1.09 mg/dL (ref 0.61–1.24)
GFR, Estimated: >60 mL/min (ref >60)
Glucose, Bld: 151 mg/dL — ABNORMAL HIGH (ref 70–99)
Potassium: 4.6 mmol/L (ref 3.5–5.1)
Sodium: 138 mmol/L (ref 135–145)
Total Bilirubin: 0.5 mg/dL (ref 0.3–1.2)
Total Protein: 6.8 g/dL (ref 6.5–8.1)

## 2022-06-20 LAB — APTT: aPTT: 31 seconds (ref 24–36)

## 2022-06-20 LAB — RAPID URINE DRUG SCREEN, HOSP PERFORMED
Amphetamines: NOT DETECTED
Barbiturates: NOT DETECTED
Benzodiazepines: NOT DETECTED
Cocaine: NOT DETECTED
Opiates: NOT DETECTED
Tetrahydrocannabinol: NOT DETECTED

## 2022-06-20 LAB — CBG MONITORING, ED: Glucose-Capillary: 135 mg/dL — ABNORMAL HIGH (ref 70–99)

## 2022-06-20 LAB — URINALYSIS, ROUTINE W REFLEX MICROSCOPIC
Bacteria, UA: NONE SEEN
Bilirubin Urine: NEGATIVE
Glucose, UA: NEGATIVE mg/dL
Hgb urine dipstick: NEGATIVE
Ketones, ur: NEGATIVE mg/dL
Leukocytes,Ua: NEGATIVE
Nitrite: NEGATIVE
Protein, ur: 30 mg/dL — AB
Specific Gravity, Urine: 1.028 (ref 1.005–1.030)
pH: 7 (ref 5.0–8.0)

## 2022-06-20 LAB — ETHANOL: Alcohol, Ethyl (B): <10 mg/dL (ref <10)

## 2022-06-20 LAB — I-STAT CHEM 8, ED
BUN: 30 mg/dL — ABNORMAL HIGH (ref 8–23)
Calcium, Ion: 1.17 mmol/L (ref 1.15–1.40)
Chloride: 103 mmol/L (ref 98–111)
Creatinine, Ser: 1.3 mg/dL — ABNORMAL HIGH (ref 0.61–1.24)
Glucose, Bld: 149 mg/dL — ABNORMAL HIGH (ref 70–99)
HCT: 35 % — ABNORMAL LOW (ref 39.0–52.0)
Hemoglobin: 11.9 g/dL — ABNORMAL LOW (ref 13.0–17.0)
Potassium: 4.7 mmol/L (ref 3.5–5.1)
Sodium: 138 mmol/L (ref 135–145)
TCO2: 26 mmol/L (ref 22–32)

## 2022-06-20 LAB — CBC
HCT: 37.5 % — ABNORMAL LOW (ref 39.0–52.0)
Hemoglobin: 12 g/dL — ABNORMAL LOW (ref 13.0–17.0)
MCH: 29.5 pg (ref 26.0–34.0)
MCHC: 32 g/dL (ref 30.0–36.0)
MCV: 92.1 fL (ref 80.0–100.0)
Platelets: 366 10*3/uL (ref 150–400)
RBC: 4.07 MIL/uL — ABNORMAL LOW (ref 4.22–5.81)
RDW: 13.6 % (ref 11.5–15.5)
WBC: 6.5 10*3/uL (ref 4.0–10.5)
nRBC: 0 % (ref 0.0–0.2)

## 2022-06-20 LAB — PROTIME-INR
INR: 1.1 (ref 0.8–1.2)
Prothrombin Time: 13.8 seconds (ref 11.4–15.2)

## 2022-06-20 LAB — AMMONIA: Ammonia: 15 umol/L (ref 9–35)

## 2022-06-20 LAB — TSH: TSH: 2.942 u[IU]/mL (ref 0.350–4.500)

## 2022-06-20 MED ORDER — LORAZEPAM 0.5 MG PO TABS: 0.5000 mg | ORAL_TABLET | Freq: Two times a day (BID) | ORAL | 0 refills | Status: DC | PRN

## 2022-06-20 MED ORDER — HALOPERIDOL LACTATE 5 MG/ML IJ SOLN
5.0000 mg | Freq: Once | INTRAMUSCULAR | Status: AC
  Administered 2022-06-20: 5 mg via INTRAMUSCULAR
  Filled 2022-06-20: qty 1

## 2022-06-20 MED ORDER — MIRTAZAPINE 15 MG PO TABS: 7.5000 mg | ORAL_TABLET | Freq: Every day | ORAL | 2 refills | Status: DC

## 2022-06-20 MED ORDER — HALOPERIDOL LACTATE 5 MG/ML IJ SOLN
1.0000 mg | Freq: Once | INTRAMUSCULAR | Status: AC
  Administered 2022-06-20: 1 mg via INTRAVENOUS
  Filled 2022-06-20: qty 1

## 2022-06-20 MED ORDER — ASPIRIN 81 MG PO TABS: 81.0000 mg | ORAL_TABLET | Freq: Every day | ORAL | 2 refills | Status: DC

## 2022-06-20 MED ORDER — HALOPERIDOL LACTATE 5 MG/ML IJ SOLN
5.0000 mg | Freq: Four times a day (QID) | INTRAMUSCULAR | Status: DC | PRN
  Administered 2022-06-20: 5 mg via INTRAMUSCULAR
  Filled 2022-06-20: qty 1

## 2022-06-20 MED ORDER — IOHEXOL 350 MG/ML SOLN
75.0000 mL | Freq: Once | INTRAVENOUS | Status: AC | PRN
  Administered 2022-06-20: 75 mL via INTRAVENOUS

## 2022-06-20 MED ORDER — HALOPERIDOL LACTATE 5 MG/ML IJ SOLN: 2.0000 mg | Freq: Once | INTRAMUSCULAR | Status: DC

## 2022-06-20 NOTE — Consult Note (Signed)
[facility] Telemedicine Consult  Requesting Provider: Robyn Haber Consult Participants: Patient, daughter  Location of the provider: [place] Location of the patient: [facility]  This consult was provided via telemedicine with 2-way video and audio communication. The patient/family was informed that care would be provided in this way and agreed to receive care in this manner.    Chief Complaint: Altered mental status  HPI: 88 year old male who at baseline is able to walk with a walker, but needs regular assistance from his daughter who stays with him most of the time.  He does live alone, but she is there "all the time."  This morning, when she got him up, she noticed that he was quite confused.  This is atypical, and he had no antecedent symptoms.  Over the course of the morning, he has been able to answer some simple questions, but has remained quite confused and they noticed left facial droop and due to the symptoms I code stroke was activated.    LKW: 12/4 prior to bed tpa given?: No, out of window IR Thrombectomy? No, poor baseline Modified Rankin Scale: 4-Needs assistance to walk and tend to bodily needs Time of teleneurologist evaluation: 11:26  Exam: Vitals:   06/20/22 1055 06/20/22 1112  BP: (!) 225/76   Pulse: 86   Resp: 12   Temp:  97.7 F (36.5 C)  SpO2: 99%     General: in bed, NAD  1A: Level of Consciousness - 0 1B: Ask Month and Age - 2 1C: 'Blink Eyes' & 'Squeeze Hands' - 1 2: Test Horizontal Extraocular Movements - 1(does not look to either side on command, but I do see him look to the right) 3: Test Visual Fields - 1(question less reaction on the left, but due to compliance and patient keeping his eyes tightly shut, this is unclear 4: Test Facial Palsy - 1 5A: Test Left Arm Motor Drift - 0 5B: Test Right Arm Motor Drift - 0 6A: Test Left Leg Motor Drift - 0 6B: Test Right Leg Motor Drift - 0 7: Test Limb Ataxia - 0 8: Test Sensation  - 0 9: Test Language/Aphasia- 1(perseveration) 10: Test Dysarthria - 0 11: Test Extinction/Inattention - 0 NIHSS score: 6  Throughout the exam, the patient was counting, beginning in the teens and counting upwards.  He would stop counting in order to answer the question "what is your name?"  To which he answered initially ted, and subsequently wane when it was repeated.  He does not answer any other questions.  Imaging Reviewed: CT/CTA-no clear LVO or acute  Labs reviewed in epic and pertinent values follow: Creatinine 1.09   Assessment: 88 year old male who presents with altered mental status with perseveration.  He does appear to have some left facial weakness, and stroke is certainly a possibility.  With this prominent of perseveration, I do think an EEG would be reasonable as well, though delirium would also be a possibility.  The precipitant of the delirium is not clear, but in a 88 year old, even without clear precipitant this needs to be consideration and he needs to be screened for systemic infections that could be contributing  Recommendations:  1) MRI brain without contrast 2) EEG 3) ammonia, TSH 4) UA, complete infectious screen per ED/IM 5) neurology will continue to follow.  Roland Rack, MD [facility] [phone]  If 7pm- 7am, please page neurology on call as listed in [place].

## 2022-06-20 NOTE — ED Triage Notes (Signed)
Pt brought in by [facility] for c/o ams; pt is usually alert and oriented x 4 but when family checked on him at 6am pt was altered; pt having trouble with urination, only urinates small amounts  Pt lives by himself and has family that checks on him a few times throughout the day; family last saw pt normal at 6pm last night  Pt fell x 2 weeks ago and has healing bruises to left side of face

## 2022-06-20 NOTE — Progress Notes (Signed)
EEG complete - results pending 

## 2022-06-20 NOTE — ED Provider Notes (Signed)
[facility] EMERGENCY DEPARTMENT Provider Note   CSN: [id number] Arrival date & time: 06/20/22  1047  An emergency department physician performed an initial assessment on this suspected stroke patient at 1116.  History  Chief Complaint  Patient presents with   Altered Mental Status    Artez Regis Guadalupe is a 88 y.o. male.   Altered Mental Status Patient brought in a mental status change.  Reportedly normal at last night around bedtime.  Family saw him this morning and found him confused.  Confusion.  Urinating less.  Facial droop.  Does have hypertension.  Saw patient with localized deficits not moving left side of this much.  Code stroke called.  Difficult to get history though due to mental status change    Past Medical History:  Diagnosis Date   Aortic stenosis    Arthritis    BPH (benign prostatic hypertrophy)    CAD (coronary artery disease)    DES to RCA 2009   Diverticulosis of colon    Essential hypertension    Fecal urgency    History of adenomatous polyp of colon    Tubular adenomas 2016   Hyperlipidemia    Lower urinary tract symptoms (LUTS)    Type 2 diabetes mellitus ([place])    Wears glasses     Home Medications Prior to Admission medications   Medication Sig Start Date End Date Taking? Authorizing Provider  aspirin 81 MG tablet Take 81 mg by mouth every evening. 10/03/19  Yes Rehman, Mechele Dawley, MD  budesonide (ENTOCORT EC) 3 MG 24 hr capsule TAKE 1 CAPSULE BY MOUTH DAILY. 06/12/22  Yes [name], MD  enalapril (VASOTEC) 10 MG tablet Take 10 mg by mouth daily.   Yes [provider]  finasteride (PROSCAR) 5 MG tablet Take 1 tablet (5 mg total) by mouth daily. as directed 05/04/21  Yes [name], [name], MD  memantine (NAMENDA) 10 MG tablet Take 10 mg by mouth 2 (two) times daily. 04/03/22  Yes [provider]  metFORMIN (GLUCOPHAGE) 500 MG tablet Take 500 mg by mouth at bedtime.    Yes [provider]  mirabegron  ER (MYRBETRIQ) 25 MG TB24 tablet Take 1 tablet (25 mg total) by mouth daily. 03/17/22  Yes [name], [name], MD  Multiple Vitamins-Minerals (PRESERVISION AREDS PO) Take 1 tablet by mouth daily.   Yes [provider]  pravastatin (PRAVACHOL) 80 MG tablet Take 80 mg by mouth daily.   Yes [provider]  silodosin (RAPAFLO) 8 MG CAPS capsule Take 1 capsule (8 mg total) by mouth in the morning and at bedtime. 03/17/22  Yes [name], [name], MD  Accu-Chek FastClix Lancets MISC USE 1 TO CHECK GLUCOSE ONCE DAILY 03/19/19   [provider]  ACCU-CHEK GUIDE test strip USE 1 STRIP TO [place] DAILY 03/19/19   [provider]  glucose blood (ACCU-CHEK GUIDE) test strip Accu-Chek Guide test strips  USE 1 STRIP TO CHECK GLUCOSE ONCE DAILY    [provider]      Allergies    Patient has no known allergies.    Review of Systems   Review of Systems  Physical Exam Updated Vital Signs BP (!) 114/95   Pulse 91   Temp 97.7 F (36.5 C) (Oral)   Resp (!) 21   SpO2 99%  Physical Exam Vitals and nursing note reviewed.  Constitutional:      Appearance: Normal appearance.  Abdominal:     Tenderness: There is no  abdominal tenderness.  Musculoskeletal:        General: No tenderness.  Skin:    Coloration: Skin is not pale.  Neurological:     Comments: Potential left facial droop.  Moving left upper extremity less compared to right.  Also left movement of left leg.  Confused.  Did answer some yes/no questions and tried to speak some words with those words were mostly gibberish.  Unsure about the quality of the yes or no answer also.     ED Results / Procedures / Treatments   Labs (all labs ordered are listed, but only abnormal results are displayed) Labs Reviewed  CBC - Abnormal; Notable for the following components:      Result Value   RBC 4.07 (*)    Hemoglobin 12.0 (*)    HCT 37.5 (*)    All other components within normal limits   COMPREHENSIVE METABOLIC PANEL - Abnormal; Notable for the following components:   Glucose, Bld 151 (*)    BUN 32 (*)    Albumin 3.4 (*)    All other components within normal limits  URINALYSIS, ROUTINE W REFLEX MICROSCOPIC - Abnormal; Notable for the following components:   Protein, ur 30 (*)    All other components within normal limits  I-STAT CHEM 8, ED - Abnormal; Notable for the following components:   BUN 30 (*)    Creatinine, Ser 1.30 (*)    Glucose, Bld 149 (*)    Hemoglobin 11.9 (*)    HCT 35.0 (*)    All other components within normal limits  CBG MONITORING, ED - Abnormal; Notable for the following components:   Glucose-Capillary 135 (*)    All other components within normal limits  ETHANOL  PROTIME-INR  APTT  DIFFERENTIAL  RAPID URINE DRUG SCREEN, HOSP PERFORMED  AMMONIA  TSH    EKG EKG Interpretation  Date/Time:  Tuesday June 20 2022 10:55:18 EST Ventricular Rate:  86 PR Interval:  194 QRS Duration: 136 QT Interval:  386 QTC Calculation: 462 R Axis:   -81 Text Interpretation: Sinus arrhythmia Ventricular bigeminy RBBB and LAFB Borderline ST elevation, lateral leads Baseline wander in lead(s) V5 V6 pvcs are new Confirmed by Davonna Belling [phone]) on 06/20/2022 11:19:33 AM  Radiology CT HEAD CODE STROKE WO CONTRAST  Result Date: 06/20/2022 CLINICAL DATA:  Code stroke. Neuro deficit, acute, stroke suspected. Left-sided facial droop. Generalized weakness. Altered mental status. EXAM: CT HEAD WITHOUT CONTRAST TECHNIQUE: Contiguous axial images were obtained from the base of the skull through the vertex without intravenous contrast. RADIATION DOSE REDUCTION: This exam was performed according to the departmental dose-optimization program which includes automated exposure control, adjustment of the mA and/or kV according to patient size and/or use of iterative reconstruction technique. COMPARISON:  Head CT 08/20/2009. FINDINGS: Brain: Moderate cerebral atrophy.  Mild patchy and ill-defined hypoattenuation within the cerebral white matter, nonspecific but compatible with chronic small vessel ischemic disease. Redemonstrated chronic lacunar infarct within the right caudate nucleus. Redemonstrated small chronic lacunar infarct versus prominent perivascular space within the left lentiform nucleus. There is no acute intracranial hemorrhage. No demarcated cortical infarct. No extra-axial fluid collection. No evidence of an intracranial mass. No midline shift. Vascular: Atherosclerotic calcifications. Small calcified focus at the anterior aspect of the right sylvian fissure, new from the prior head CT head CT of 08/20/2009 and possibly reflecting calcified embolic plaque (series 2, image 16) (series 5, image 19). Skull: No fracture or aggressive osseous lesion. Sinuses/Orbits: No mass or acute finding within  the imaged orbits. Small volume frothy secretions within the left sphenoid sinus. Other: Small left forehead hematoma. Subcutaneous right forehead lipoma measuring 1.6 x 0.4 cm (series 2, image 18). ASPECTS ([place] Stroke Program Early CT Score) - Ganglionic level infarction (caudate, lentiform nuclei, internal capsule, insula, M1-M3 cortex): 7 - Supraganglionic infarction (M4-M6 cortex): 3 Total score (0-10 with 10 being normal): 10 Impressions #1 and #2 called by telephone at the time of interpretation on 06/20/2022 at 11:40 am to provider Davonna Belling , who verbally acknowledged these results. IMPRESSION: 1. No evidence of acute intracranial hemorrhage or acute infarct. 2. Small calcified focus at the anterior aspect of the right sylvian fissure, new from the prior head CT of 08/20/2009 and possibly reflecting calcified embolic plaque. 3. Mild chronic small vessel ischemic changes within the cerebral white matter. 4. Redemonstrated small chronic lacunar infarct within the right caudate nucleus, and possible small chronic lacunar infarct within the left lentiform  nucleus. 5. Moderate cerebral atrophy. 6. Small left forehead hematoma. 7. 1.6 cm subcutaneous right forehead lipoma. 8. Left sphenoid sinusitis. Electronically Signed   By: Kellie Simmering D.O.   On: 06/20/2022 11:42    Procedures Procedures    Medications Ordered in ED Medications  iohexol (OMNIPAQUE) 350 MG/ML injection 75 mL (75 mLs Intravenous Contrast Given 06/20/22 1139)    ED Course/ Medical Decision Making/ A&P                           Medical Decision Making Amount and/or Complexity of Data Reviewed Labs: ordered. Radiology: ordered.  Risk Prescription drug management.   Patient with mental status change.  Is lateralizing with neurodeficits.  Code stroke called.  Seen by Dr. Leonel Ramsay by teleneurology.  Head CT reassuring and CTA did not show definite large vessel occlusion that could be intervened on.  Urine does not show infection.  Chest x-ray reassuring.  However the cervical CT did show potentially a lung mass suspicious for malignancy.  Creatinine at baseline.  No clear sign of infection.  Will require admission to the hospital.  Not a TNK candidate due to time of onset.  Will admit to hospitalist.  Neurology will be following.   I discussed with neurology and independently interpreted the chest or x-ray EKG and blood work. CRITICAL CARE Performed by: Davonna Belling Total critical care time: 30 minutes Critical care time was exclusive of separately billable procedures and treating other patients. Critical care was necessary to treat or prevent imminent or life-threatening deterioration. Critical care was time spent personally by me on the following activities: development of treatment plan with patient and/or surrogate as well as nursing, discussions with consultants, evaluation of patient's response to treatment, examination of patient, obtaining history from patient or surrogate, ordering and performing treatments and interventions, ordering and review of laboratory  studies, ordering and review of radiographic studies, pulse oximetry and re-evaluation of patient's condition.         Final Clinical Impression(s) / ED Diagnoses Final diagnoses:  Focal neurological deficit  Encephalopathy    Rx / DC Orders ED Discharge Orders     None         Davonna Belling, MD 06/20/22 1351

## 2022-06-20 NOTE — Procedures (Signed)
[facility]  Routine EEG Report   Demographics: Patient Name:   Jordan, [name]  Date of Birth:   01-Feb-1923  Identification Number:   MRN - [id number]  Study Times: Duration:   24 minutes  Indication(s): Encephalopathy  Technical Summary:  This EEG was performed utilizing standard International 10-20 System of electrode placement. Data were obtained, stored, and interpreted utilizing referential montage recording, with reformatting to longitudinal, transverse bipolar, and referential montages as necessary for interpretation.  State(s):       Awake      Drowsy      Asleep   Activation Procedures: Hyperventilation: Not performed Photic Stimulation: Not performed   EEG Description:  No normal patterns of awake or sleep were present during this recording. Intermittent generalized theta-delta slowing was present. Excess continuous generalized theta-delta slowing was present. Occasional severe myogenic artifact precluded a more thorough interpretation of this recording.  Impression:  Abnormal EEG due to:    1) No normal patterns of awake or sleep  2) Intermittent generalized theta-delta slowing  3) Continuous generalized theta-delta slowing     Clinical Correlation:  This EEG is suggestive of a moderate encephalopathy, but is nonspecific as to etiology. The absence of epileptiform abnormalities does not preclude a clinical diagnosis of seizures.  Nicolette Bang) [place] Board of Psychiatry and Neurology (ABPN); American Board of Clinical Neurophysiology  (ABCN)       [facility] For Inpatient follow-up with [facility] physician please call [facility] [phone]. This is not an outpatient service. Post hospital discharge, please contact hospital directly.

## 2022-06-20 NOTE — Consult Note (Signed)
Patient Demographics:    Jordan [name], is a 88 y.o. male  MRN: [id number]   DOB - 13-Oct-1922  Admit Date - 06/20/2022  Outpatient Primary MD for the patient is Asencion Noble, MD   Assessment & Plan:   Assessment and Plan: 1)Generalized weakness/fall at home/confusional episodes with disorientation and agitation-- Additional history obtained from patient's daughter [name] and granddaughter Danae Chen at bedside -Code stroke was activated on presentation due to concerns about possible left-sided facial droop and global weakness..  Not a tPA candidate as he was last known well the night before -Teleneuro consult from Dr. Kathrynn Speed appreciated -Patient is hard of hearing so he may not answer questions clearly and accurately due to poor hearing --UA is not suggestive of UTI -Chest x-ray is not suggestive of pneumonia -Serum ammonia is not elevated -UDS is negative -Blood alcohol level negative -WBC 6.5, TSH 2.9 Sodium potassium chloride bicarb , calcium and LFTs are WNL -Initial creatinine was 1.09 -EEG without epileptiform findings -MRI brain without acute stroke or other acute findings- -CT head without contrast and CTA head without acute stroke or LVO,  --Extensive imaging and lab workup failed to demonstrate any evidence of infection or acute neurological process -Serial examination failed to demonstrate any new acute neurofindings -Okay to discharge home on Remeron and as needed lorazepam -Follow-up with PCP for recheck -Home health physical therapy social worker and RN as been requested -  2)dementia --did not tolerate Aricept currently on Namenda -Dementia may be contributing to #1 above -Patient is a few weeks from being 88 years old  3)CTA head shows Peripheral subsolid pulmonary opacity has  increased in size compared to 2017 and is suspicious for malignancy. Recommend further evaluation with a dedicated chest CT -Discussed with daughter [name] and granddaughter Danae Chen at bedside -Did not want to pursue further workup for this suspicious pulmonary opacity at this time   Discharge instructions 1)Generalized weakness/Fall at home and Confusional episodes with agitation-----we have requested home health physical therapy, registered nurse service and social worker services--- to help you with your needs at home- -social worker should call you in a day or so to set this up -If you do not hear from [place] in the next 24 to 48 hours please call [phone]  2) please use Remeron/mirtazapine and lorazepam/Ativan as prescribed and as needed for anxiety restlessness and confusional episodes  3) follow-up with primary care physician within a week for recheck and reevaluation your medications may have to be adjusted further  Dispo: The patient is from: Home              Anticipated d/c is to: Home with [facility] services               With History of - Reviewed by me  Past Medical History:  Diagnosis Date   Aortic stenosis    Arthritis    BPH (benign prostatic hypertrophy)    CAD (coronary artery  disease)    DES to RCA 2009   Diverticulosis of colon    Essential hypertension    Fecal urgency    History of adenomatous polyp of colon    Tubular adenomas 2016   Hyperlipidemia    Lower urinary tract symptoms (LUTS)    Type 2 diabetes mellitus ([place])    Wears glasses       Past Surgical History:  Procedure Laterality Date   BIOPSY  10/02/2019   Procedure: BIOPSY;  Surgeon: Rogene Houston, MD;  [facility];  Service: Endoscopy;;   CATARACT EXTRACTION W/ INTRAOCULAR LENS  IMPLANT, BILATERAL     COLONOSCOPY  last one 11/16/2014   polpectomy   CORONARY ANGIOPLASTY WITH STENT PLACEMENT  12-23-2007  dr hochrein   (abnormal myoview)  PCI w/ DES x1 pRCA (95%),  pCFX 25%,  mCFX  40-50%,  after PDA 30%,  prox, mid, and ostial LAD  30% calcification,  LM diffuse 25% calcification,  ef 60%   EVALUATION UNDER ANESTHESIA WITH ANAL FISTULECTOMY [name] 06/03/2015   Procedure: ANAL EXAM UNDER ANESTHESIA ,FISTULOTOMY ;  Surgeon: [name], MD;  Location: [place];  Service: General;  Laterality: [name];   FLEXIBLE SIGMOIDOSCOPY [name] 10/02/2019   Procedure: FLEXIBLE SIGMOIDOSCOPY;  Surgeon: Rogene Houston, MD;  [facility];  Service: Endoscopy;  Laterality: [name];  230   INGUINAL HERNIA REPAIR Right 2001   TRANSTHORACIC ECHOCARDIOGRAM  12-06-2007   normal LVF, ef 60%/  mild to moderate  AV calcification without stenosis,  mild AR and TR,  mild LAE      Chief Complaint  Patient presents with   Altered Mental Status      HPI:    Jordan [name]  is a 88 y.o. male reformed smoker with past medical history asthma for HTN, dementia did not tolerate Aricept currently on Namenda, BPH currently on Proscar and Rapaflo, and Myrbetriq, DM2, HLD, and history of lymphocytic colitis on tapering doses of Entocort steroid presents to the ED accompanied by daughter [name] and granddaughter Danae Chen with concerns about acute confusional episode that started in a.m. of 06/20/2022 -Patient apparently had a fall couple weeks ago when he went to the bathroom without his walker, at baseline usually walks with his walker -PTA patient lived alone but his daughter [name] checks on him very frequently -Additional history obtained from patient's daughter [name] and granddaughter Danae Chen at bedside -Code stroke was activated on presentation due to concerns about possible left-sided facial droop and global weakness..  Not a tPA candidate as he was last known well the night before -Teleneuro consult from Dr. Kathrynn Speed appreciated -Patient is hard of hearing so he may not answer questions clearly and accurately due to poor hearing - -UA is not suggestive of UTI -Chest x-ray is  not suggestive of pneumonia -Serum ammonia is not elevated -UDS is negative -Blood alcohol level negative -WBC 6.5, TSH 2.9 -Hemoglobin 12.0 which is about patient's baseline platelets 366 which is about patient's baseline -Sodium potassium chloride bicarb , calcium and LFTs are WNL -Initial creatinine was 1.09 -EEG without epileptiform findings -MRI brain without acute stroke or other acute findings- -CT head without contrast and CTA head without acute stroke or LVO,  CTA head shows Peripheral subsolid pulmonary opacity has increased in size compared to 2017 and is suspicious for malignancy. Recommend further evaluation with a dedicated chest CT -Chest x-ray without acute cardiopulmonary findings    Review of systems:    In  addition to the HPI above,   A full Review of  Systems was done, all other systems reviewed are negative except as noted above in HPI , .    Social History:  Reviewed by me    Social History   Tobacco Use   Smoking status: Former    Years: 30.00    Types: Cigarettes    Quit date: 06/01/1983    Years since quitting: 39.0   Smokeless tobacco: Never  Substance Use Topics   Alcohol use: No    Family History :  Reviewed by me    Family History  Problem Relation Age of Onset   Heart disease Brother    Colon cancer Neg Hx      Home Medications:   Prior to Admission medications   Medication Sig Start Date End Date Taking? Authorizing Provider  budesonide (ENTOCORT EC) 3 MG 24 hr capsule TAKE 1 CAPSULE BY MOUTH DAILY. 06/12/22  Yes [name], MD  enalapril (VASOTEC) 10 MG tablet Take 10 mg by mouth daily.   Yes [provider]  finasteride (PROSCAR) 5 MG tablet Take 1 tablet (5 mg total) by mouth daily. as directed 05/04/21  Yes [name], [name], MD  LORazepam (ATIVAN) 0.5 MG tablet Take 1 tablet (0.5 mg total) by mouth every 12 (twelve) hours as needed for anxiety or sleep. 06/20/22 06/20/23 Yes Penina Reisner, MD   memantine (NAMENDA) 10 MG tablet Take 10 mg by mouth 2 (two) times daily. 04/03/22  Yes [provider]  metFORMIN (GLUCOPHAGE) 500 MG tablet Take 500 mg by mouth at bedtime.    Yes [provider]  mirabegron ER (MYRBETRIQ) 25 MG TB24 tablet Take 1 tablet (25 mg total) by mouth daily. 03/17/22  Yes [name], [name], MD  mirtazapine (REMERON) 15 MG tablet Take 0.5 tablets (7.5 mg total) by mouth at bedtime. 06/20/22  Yes Kadeidra Coryell, MD  Multiple Vitamins-Minerals (PRESERVISION AREDS PO) Take 1 tablet by mouth daily.   Yes [provider]  pravastatin (PRAVACHOL) 80 MG tablet Take 80 mg by mouth daily.   Yes [provider]  silodosin (RAPAFLO) 8 MG CAPS capsule Take 1 capsule (8 mg total) by mouth in the morning and at bedtime. 03/17/22  Yes [name], [name], MD  aspirin 81 MG tablet Take 1 tablet (81 mg total) by mouth daily with breakfast. 06/20/22   Roxan Hockey, MD     Allergies:    No Known Allergies   Physical Exam:   Vitals  Blood pressure (!) 170/68, pulse 87, temperature 97.8 F (36.6 C), temperature source Oral, resp. rate 18, SpO2 99 %.  Physical Examination: General appearance - alert,  in no distress  Mental status - alert, oriented to person, he relates appropriately to his daughter [name] and also relates appropriately with his granddaughter Danae Chen at bedside Ears-HOH Eyes - sclera anicteric Neck - supple, no JVD elevation , Chest - clear  to auscultation bilaterally, symmetrical air movement,  Heart - S1 and S2 normal, regular  Abdomen - soft, nontender, nondistended, +BS Neurological -globally confused, disoriented, agitated at times, , only oriented to self  -cranial nerves II through XII intact, DTR's normal and symmetric, moving all extremities well -no new focal deficits noted, speech appears clear Skin/extremities-intact peripheral pulses, skin is warm, dry, extremities with ecchymosis, bruises, hemostatic small  abrasions, left periumbilical area resolving ecchymosis at the different stages of color Changes    Data Review:    CBC Recent Labs  Lab 06/20/22 1108 06/20/22 1125  WBC 6.5  --   HGB 12.0* 11.9*  HCT 37.5* 35.0*  PLT 366  --   MCV 92.1  --   MCH 29.5  --   MCHC 32.0  --   RDW 13.6  --   LYMPHSABS 1.3  --   MONOABS 0.6  --   EOSABS 0.1  --   BASOSABS 0.0  --    ------------------------------------------------------------------------------------------------------------------  Chemistries  Recent Labs  Lab 06/20/22 1108 06/20/22 1125  NA 138 138  K 4.6 4.7  CL 102 103  CO2 27  --   GLUCOSE 151* 149*  BUN 32* 30*  CREATININE 1.09 1.30*  CALCIUM 9.0  --   AST 21  --   ALT 21  --   ALKPHOS 103  --   BILITOT 0.5  --    ------------------------------------------------------------------------------------------------------------------ CrCl cannot be calculated (Unknown ideal weight.). ------------------------------------------------------------------------------------------------------------------ Recent Labs    06/20/22 1108  TSH 2.942     Coagulation profile Recent Labs  Lab 06/20/22 1108  INR 1.1   ---------------------------------------------------------------------------------------------------------------  Urinalysis    Component Value Date/Time   COLORURINE YELLOW 06/20/2022 1118   APPEARANCEUR CLEAR 06/20/2022 1118   APPEARANCEUR Clear 03/17/2022 1240   LABSPEC 1.028 06/20/2022 1118   PHURINE 7.0 06/20/2022 1118   GLUCOSEU NEGATIVE 06/20/2022 1118   HGBUR NEGATIVE 06/20/2022 1118   [place] 06/20/2022 1118   BILIRUBINUR negative 04/13/2022 1525   BILIRUBINUR Negative 03/17/2022 1240   [place] 06/20/2022 1118   PROTEINUR 30 (A) 06/20/2022 1118   UROBILINOGEN 1.0 04/13/2022 1525   NITRITE NEGATIVE 06/20/2022 1118   LEUKOCYTESUR NEGATIVE 06/20/2022 1118     ----------------------------------------------------------------------------------------------------------------   Imaging Results:    MR BRAIN WO CONTRAST  Result Date: 06/20/2022 CLINICAL DATA:  Left-sided facial droop, generalized weakness, stroke suspected EXAM: MRI HEAD WITHOUT CONTRAST TECHNIQUE: Multiplanar, multiecho pulse sequences of the brain and surrounding structures were obtained without intravenous contrast. COMPARISON:  No prior MRI, correlation is made with CT head 06/20/2022 FINDINGS: Brain: No restricted diffusion to suggest acute or subacute infarct. No acute hemorrhage, mass, mass effect, or midline shift. No hydrocephalus or extra-axial collection. Redemonstrated advanced cerebral atrophy, with ex vacuo dilatation the ventricles. Vascular: Normal arterial flow voids. Skull and upper cervical spine: Evaluation is somewhat motion limited, but marrow signal is grossly normal. Sinuses/Orbits: Mild mucosal thickening in the ethmoid air cells. Other: The mastoids are well aerated. IMPRESSION: No acute intracranial process. No evidence of acute or subacute infarct. Electronically Signed   By: Merilyn Baba M.D.   On: 06/20/2022 16:39   EEG adult  Result Date: 06/20/2022 Landry Corporal, MD     06/20/2022  6:10 PM [facility] Consult Services Routine EEG Report Demographics: Patient Name:   [name] Date of Birth:   1923/06/25 Identification Number:   MRN - [id number] Study Times: Duration:   24 minutes Indication(s): Encephalopathy Technical Summary: This EEG was performed utilizing standard International 10-20 System of electrode placement. Data were obtained, stored, and interpreted utilizing referential montage recording, with reformatting to longitudinal, transverse bipolar, and referential montages as necessary for interpretation. State(s):       Awake      Drowsy      Asleep Activation Procedures: Hyperventilation: Not performed Photic  Stimulation: Not performed EEG Description: No normal patterns of awake or sleep were present during this recording. Intermittent generalized theta-delta slowing was present. Excess continuous generalized theta-delta slowing was present. Occasional severe myogenic artifact  precluded a more thorough interpretation of this recording. Impression: Abnormal EEG due to:   1) No normal patterns of awake or sleep  2) Intermittent generalized theta-delta slowing  3) Continuous generalized theta-delta slowing  Clinical Correlation: This EEG is suggestive of a moderate encephalopathy, but is nonspecific as to etiology. The absence of epileptiform abnormalities does not preclude a clinical diagnosis of seizures. Nicolette Bang) [place] Board of Psychiatry and Neurology (ABPN); American Board of Clinical Neurophysiology  (ABCN) [facility] For Inpatient follow-up with [facility] physician please call [facility] [phone]. This is not an outpatient service. Post hospital discharge, please contact hospital directly.   DG Chest Portable 1 View  Result Date: 06/20/2022 CLINICAL DATA:  Altered mental status. EXAM: PORTABLE CHEST 1 VIEW COMPARISON:  02/28/2021 FINDINGS: Midline trachea. Mild cardiomegaly. Atherosclerosis in the transverse aorta. No pleural effusion or pneumothorax. Nonspecific mild interstitial prominence could be related to remote smoking history. No lobar consolidation. IMPRESSION: No acute cardiopulmonary disease. Aortic Atherosclerosis (ICD10-I70.0). Electronically Signed   By: Abigail Miyamoto M.D.   On: 06/20/2022 12:29   CT ANGIO HEAD NECK W WO CM (CODE STROKE)  Result Date: 06/20/2022 CLINICAL DATA:  Left-sided facial droop. EXAM: CT ANGIOGRAPHY HEAD AND NECK TECHNIQUE: Multidetector CT imaging of the head and neck was performed using the standard protocol during bolus administration of intravenous contrast. Multiplanar CT image reconstructions and MIPs were obtained to evaluate the  vascular anatomy. Carotid stenosis measurements (when applicable) are obtained utilizing NASCET criteria, using the distal internal carotid diameter as the denominator. RADIATION DOSE REDUCTION: This exam was performed according to the departmental dose-optimization program which includes automated exposure control, adjustment of the mA and/or kV according to patient size and/or use of iterative reconstruction technique. CONTRAST:  86mL OMNIPAQUE IOHEXOL 350 MG/ML SOLN COMPARISON:  Same day CT brain FINDINGS: CT HEAD FINDINGS See same day CT brain for additional findings. CTA NECK FINDINGS Aortic arch: Standard branching. Imaged portion shows no evidence of aneurysm or dissection. No significant stenosis of the major arch vessel origins. Right carotid system: Approximately 75% stenosis at the origin of the right ICA. Left carotid system: Mild stenosis of the origin of the left ICA. Vertebral arteries: Codominant. No evidence of dissection, stenosis (50% or greater), or occlusion. Skeleton: There is large degenerative pannus at C1-C2 that results in of the spinal canal. There is also a calcified disc extrusion at C5-C6 that results in mild-to-moderate spinal canal narrowing. Other neck: Negative. Upper chest: Peripheral subsolid pulmonary opacity has increased in size compared to 2017 and is suspicious for malignancy. Review of the MIP images confirms the above findings CTA HEAD FINDINGS Anterior circulation: Hyperdense focus seen on same day CT brain correlates with a calcified focal plaque of uncertain clinical significance. Posterior circulation: There is moderate to severe focal stenosis in the distal P1 and proximal P2 segments of the left PCA (series 5, image 218, 2-5). Venous sinuses: As permitted by contrast timing, patent. Anatomic variants: None Review of the MIP images confirms the above findings IMPRESSION: 1. No evidence of large vessel occlusion. 2. Severe focal stenoses in the distal P1 and proximal P2  segments of the left PCA. 3. Approximately 75% stenosis at the origin of the right ICA. 4. Peripheral subsolid pulmonary opacity has increased in size compared to 2017 and is suspicious for malignancy. Recommend further evaluation with a dedicated chest CT. Electronically Signed   By: Marin Roberts M.D.   On: 06/20/2022 12:19   CT HEAD CODE STROKE WO  CONTRAST  Result Date: 06/20/2022 CLINICAL DATA:  Code stroke. Neuro deficit, acute, stroke suspected. Left-sided facial droop. Generalized weakness. Altered mental status. EXAM: CT HEAD WITHOUT CONTRAST TECHNIQUE: Contiguous axial images were obtained from the base of the skull through the vertex without intravenous contrast. RADIATION DOSE REDUCTION: This exam was performed according to the departmental dose-optimization program which includes automated exposure control, adjustment of the mA and/or kV according to patient size and/or use of iterative reconstruction technique. COMPARISON:  Head CT 08/20/2009. FINDINGS: Brain: Moderate cerebral atrophy. Mild patchy and ill-defined hypoattenuation within the cerebral white matter, nonspecific but compatible with chronic small vessel ischemic disease. Redemonstrated chronic lacunar infarct within the right caudate nucleus. Redemonstrated small chronic lacunar infarct versus prominent perivascular space within the left lentiform nucleus. There is no acute intracranial hemorrhage. No demarcated cortical infarct. No extra-axial fluid collection. No evidence of an intracranial mass. No midline shift. Vascular: Atherosclerotic calcifications. Small calcified focus at the anterior aspect of the right sylvian fissure, new from the prior head CT head CT of 08/20/2009 and possibly reflecting calcified embolic plaque (series 2, image 16) (series 5, image 19). Skull: No fracture or aggressive osseous lesion. Sinuses/Orbits: No mass or acute finding within the imaged orbits. Small volume frothy secretions within the left sphenoid  sinus. Other: Small left forehead hematoma. Subcutaneous right forehead lipoma measuring 1.6 x 0.4 cm (series 2, image 18). ASPECTS ([place] Stroke Program Early CT Score) - Ganglionic level infarction (caudate, lentiform nuclei, internal capsule, insula, M1-M3 cortex): 7 - Supraganglionic infarction (M4-M6 cortex): 3 Total score (0-10 with 10 being normal): 10 Impressions #1 and #2 called by telephone at the time of interpretation on 06/20/2022 at 11:40 am to provider Davonna Belling , who verbally acknowledged these results. IMPRESSION: 1. No evidence of acute intracranial hemorrhage or acute infarct. 2. Small calcified focus at the anterior aspect of the right sylvian fissure, new from the prior head CT of 08/20/2009 and possibly reflecting calcified embolic plaque. 3. Mild chronic small vessel ischemic changes within the cerebral white matter. 4. Redemonstrated small chronic lacunar infarct within the right caudate nucleus, and possible small chronic lacunar infarct within the left lentiform nucleus. 5. Moderate cerebral atrophy. 6. Small left forehead hematoma. 7. 1.6 cm subcutaneous right forehead lipoma. 8. Left sphenoid sinusitis. Electronically Signed   By: Kellie Simmering D.O.   On: 06/20/2022 11:42    Radiological Exams on Admission: MR BRAIN WO CONTRAST  Result Date: 06/20/2022 CLINICAL DATA:  Left-sided facial droop, generalized weakness, stroke suspected EXAM: MRI HEAD WITHOUT CONTRAST TECHNIQUE: Multiplanar, multiecho pulse sequences of the brain and surrounding structures were obtained without intravenous contrast. COMPARISON:  No prior MRI, correlation is made with CT head 06/20/2022 FINDINGS: Brain: No restricted diffusion to suggest acute or subacute infarct. No acute hemorrhage, mass, mass effect, or midline shift. No hydrocephalus or extra-axial collection. Redemonstrated advanced cerebral atrophy, with ex vacuo dilatation the ventricles. Vascular: Normal arterial flow voids. Skull and upper  cervical spine: Evaluation is somewhat motion limited, but marrow signal is grossly normal. Sinuses/Orbits: Mild mucosal thickening in the ethmoid air cells. Other: The mastoids are well aerated. IMPRESSION: No acute intracranial process. No evidence of acute or subacute infarct. Electronically Signed   By: Merilyn Baba M.D.   On: 06/20/2022 16:39   EEG adult  Result Date: 06/20/2022 Landry Corporal, MD     06/20/2022  6:10 PM [facility] Consult Services Routine EEG Report Demographics: Patient Name:   [name] Date of Birth:  May 30, 1923 Identification Number:   MRN - [id number] Study Times: Duration:   24 minutes Indication(s): Encephalopathy Technical Summary: This EEG was performed utilizing standard International 10-20 System of electrode placement. Data were obtained, stored, and interpreted utilizing referential montage recording, with reformatting to longitudinal, transverse bipolar, and referential montages as necessary for interpretation. State(s):       Awake      Drowsy      Asleep Activation Procedures: Hyperventilation: Not performed Photic Stimulation: Not performed EEG Description: No normal patterns of awake or sleep were present during this recording. Intermittent generalized theta-delta slowing was present. Excess continuous generalized theta-delta slowing was present. Occasional severe myogenic artifact precluded a more thorough interpretation of this recording. Impression: Abnormal EEG due to:   1) No normal patterns of awake or sleep  2) Intermittent generalized theta-delta slowing  3) Continuous generalized theta-delta slowing  Clinical Correlation: This EEG is suggestive of a moderate encephalopathy, but is nonspecific as to etiology. The absence of epileptiform abnormalities does not preclude a clinical diagnosis of seizures. Nicolette Bang) [place] Board of Psychiatry and Neurology (ABPN); American Board of Clinical  Neurophysiology  (ABCN) [facility] For Inpatient follow-up with [facility] physician please call [facility] [phone]. This is not an outpatient service. Post hospital discharge, please contact hospital directly.   DG Chest Portable 1 View  Result Date: 06/20/2022 CLINICAL DATA:  Altered mental status. EXAM: PORTABLE CHEST 1 VIEW COMPARISON:  02/28/2021 FINDINGS: Midline trachea. Mild cardiomegaly. Atherosclerosis in the transverse aorta. No pleural effusion or pneumothorax. Nonspecific mild interstitial prominence could be related to remote smoking history. No lobar consolidation. IMPRESSION: No acute cardiopulmonary disease. Aortic Atherosclerosis (ICD10-I70.0). Electronically Signed   By: Abigail Miyamoto M.D.   On: 06/20/2022 12:29   CT ANGIO HEAD NECK W WO CM (CODE STROKE)  Result Date: 06/20/2022 CLINICAL DATA:  Left-sided facial droop. EXAM: CT ANGIOGRAPHY HEAD AND NECK TECHNIQUE: Multidetector CT imaging of the head and neck was performed using the standard protocol during bolus administration of intravenous contrast. Multiplanar CT image reconstructions and MIPs were obtained to evaluate the vascular anatomy. Carotid stenosis measurements (when applicable) are obtained utilizing NASCET criteria, using the distal internal carotid diameter as the denominator. RADIATION DOSE REDUCTION: This exam was performed according to the departmental dose-optimization program which includes automated exposure control, adjustment of the mA and/or kV according to patient size and/or use of iterative reconstruction technique. CONTRAST:  32mL OMNIPAQUE IOHEXOL 350 MG/ML SOLN COMPARISON:  Same day CT brain FINDINGS: CT HEAD FINDINGS See same day CT brain for additional findings. CTA NECK FINDINGS Aortic arch: Standard branching. Imaged portion shows no evidence of aneurysm or dissection. No significant stenosis of the major arch vessel origins. Right carotid system: Approximately 75% stenosis at the origin of the  right ICA. Left carotid system: Mild stenosis of the origin of the left ICA. Vertebral arteries: Codominant. No evidence of dissection, stenosis (50% or greater), or occlusion. Skeleton: There is large degenerative pannus at C1-C2 that results in of the spinal canal. There is also a calcified disc extrusion at C5-C6 that results in mild-to-moderate spinal canal narrowing. Other neck: Negative. Upper chest: Peripheral subsolid pulmonary opacity has increased in size compared to 2017 and is suspicious for malignancy. Review of the MIP images confirms the above findings CTA HEAD FINDINGS Anterior circulation: Hyperdense focus seen on same day CT brain correlates with a calcified focal plaque of uncertain clinical significance. Posterior circulation: There is moderate to severe focal stenosis in the distal P1  and proximal P2 segments of the left PCA (series 5, image 218, 2-5). Venous sinuses: As permitted by contrast timing, patent. Anatomic variants: None Review of the MIP images confirms the above findings IMPRESSION: 1. No evidence of large vessel occlusion. 2. Severe focal stenoses in the distal P1 and proximal P2 segments of the left PCA. 3. Approximately 75% stenosis at the origin of the right ICA. 4. Peripheral subsolid pulmonary opacity has increased in size compared to 2017 and is suspicious for malignancy. Recommend further evaluation with a dedicated chest CT. Electronically Signed   By: Marin Roberts M.D.   On: 06/20/2022 12:19   CT HEAD CODE STROKE WO CONTRAST  Result Date: 06/20/2022 CLINICAL DATA:  Code stroke. Neuro deficit, acute, stroke suspected. Left-sided facial droop. Generalized weakness. Altered mental status. EXAM: CT HEAD WITHOUT CONTRAST TECHNIQUE: Contiguous axial images were obtained from the base of the skull through the vertex without intravenous contrast. RADIATION DOSE REDUCTION: This exam was performed according to the departmental dose-optimization program which includes automated  exposure control, adjustment of the mA and/or kV according to patient size and/or use of iterative reconstruction technique. COMPARISON:  Head CT 08/20/2009. FINDINGS: Brain: Moderate cerebral atrophy. Mild patchy and ill-defined hypoattenuation within the cerebral white matter, nonspecific but compatible with chronic small vessel ischemic disease. Redemonstrated chronic lacunar infarct within the right caudate nucleus. Redemonstrated small chronic lacunar infarct versus prominent perivascular space within the left lentiform nucleus. There is no acute intracranial hemorrhage. No demarcated cortical infarct. No extra-axial fluid collection. No evidence of an intracranial mass. No midline shift. Vascular: Atherosclerotic calcifications. Small calcified focus at the anterior aspect of the right sylvian fissure, new from the prior head CT head CT of 08/20/2009 and possibly reflecting calcified embolic plaque (series 2, image 16) (series 5, image 19). Skull: No fracture or aggressive osseous lesion. Sinuses/Orbits: No mass or acute finding within the imaged orbits. Small volume frothy secretions within the left sphenoid sinus. Other: Small left forehead hematoma. Subcutaneous right forehead lipoma measuring 1.6 x 0.4 cm (series 2, image 18). ASPECTS ([place] Stroke Program Early CT Score) - Ganglionic level infarction (caudate, lentiform nuclei, internal capsule, insula, M1-M3 cortex): 7 - Supraganglionic infarction (M4-M6 cortex): 3 Total score (0-10 with 10 being normal): 10 Impressions #1 and #2 called by telephone at the time of interpretation on 06/20/2022 at 11:40 am to provider Davonna Belling , who verbally acknowledged these results. IMPRESSION: 1. No evidence of acute intracranial hemorrhage or acute infarct. 2. Small calcified focus at the anterior aspect of the right sylvian fissure, new from the prior head CT of 08/20/2009 and possibly reflecting calcified embolic plaque. 3. Mild chronic small vessel  ischemic changes within the cerebral white matter. 4. Redemonstrated small chronic lacunar infarct within the right caudate nucleus, and possible small chronic lacunar infarct within the left lentiform nucleus. 5. Moderate cerebral atrophy. 6. Small left forehead hematoma. 7. 1.6 cm subcutaneous right forehead lipoma. 8. Left sphenoid sinusitis. Electronically Signed   By: Kellie Simmering D.O.   On: 06/20/2022 11:42     Family Communication:  patients condition and plan of care including tests being ordered have been discussed with the patient and daughter [name] and Lurline Hare who indicate understanding and agree with the plan   Condition  -stable  Roxan Hockey M.D on 06/20/2022 at 6:38 PM Go to [email or web address] -  for contact info  [facility] - Office  [phone]

## 2022-06-20 NOTE — ED Notes (Signed)
Pt agitated, pulling at lines and tubes. Pt difficult to redirect. MD notified and orders for haldol IV obtained. See MAR.

## 2022-06-20 NOTE — ED Notes (Signed)
Dr. Denton Brick updated on pt's blood in urine, skin tears, and bruising. Pt is not on blood thinners per family. NAD noted at this time.

## 2022-06-20 NOTE — Consult Note (Signed)
Telestroke cart was activated at 1117. Per Primary RN, pt via EMS with c/o AMS, L sided facial droop and global weakness. Pt was normal last night at bed time at approx 2200. Family noted that pt got up to use restroom at 0000 with no issues. Pt hypertensive on arrival. Leonel Ramsay,, Neurologist was paged at 1124. Pt was transported to CT at 1123 and returned at 1134. Based on neurologist assessment, pt does not meet criteria for emergent interventions at this time. Neurologist to f/u with EDP regarding recommendations for this patient.

## 2022-06-20 NOTE — Discharge Instructions (Signed)
1)Generalized weakness/Fall at home and Confusional episodes with agitation-----we have requested home health physical therapy, registered nurse service and social worker services--- to help you with your needs at home- -social worker should call you in a day or so to set this up -If you do not hear from [place] in the next 24 to 48 hours please call [phone]  2) please use Remeron/mirtazapine and lorazepam/Ativan as prescribed and as needed for anxiety restlessness and confusional episodes  3) follow-up with primary care physician within a week for recheck and reevaluation your medications may have to be adjusted further

## 2022-06-21 ENCOUNTER — Inpatient Hospital Stay (HOSPITAL_COMMUNITY)
Admission: EM | Admit: 2022-06-21 | Discharge: 2022-07-06 | DRG: 884 | Disposition: A | Discharge status: Skilled Nursing Facility | Payer: Medicare Other | Attending: Internal Medicine | Admitting: Internal Medicine

## 2022-06-21 DIAGNOSIS — M1990 Unspecified osteoarthritis, unspecified site: Secondary | ICD-10-CM | POA: Diagnosis present

## 2022-06-21 DIAGNOSIS — K52832 Lymphocytic colitis: Secondary | ICD-10-CM | POA: Diagnosis present

## 2022-06-21 DIAGNOSIS — I10 Essential (primary) hypertension: Secondary | ICD-10-CM | POA: Diagnosis present

## 2022-06-21 DIAGNOSIS — R627 Adult failure to thrive: Secondary | ICD-10-CM | POA: Diagnosis present

## 2022-06-21 DIAGNOSIS — Z681 Body mass index (BMI) 19 or less, adult: Secondary | ICD-10-CM

## 2022-06-21 DIAGNOSIS — E119 Type 2 diabetes mellitus without complications: Secondary | ICD-10-CM | POA: Diagnosis present

## 2022-06-21 DIAGNOSIS — R636 Underweight: Secondary | ICD-10-CM | POA: Diagnosis present

## 2022-06-21 DIAGNOSIS — Z8249 Family history of ischemic heart disease and other diseases of the circulatory system: Secondary | ICD-10-CM

## 2022-06-21 DIAGNOSIS — Z79899 Other long term (current) drug therapy: Secondary | ICD-10-CM

## 2022-06-21 DIAGNOSIS — Z955 Presence of coronary angioplasty implant and graft: Secondary | ICD-10-CM

## 2022-06-21 DIAGNOSIS — E785 Hyperlipidemia, unspecified: Secondary | ICD-10-CM | POA: Diagnosis present

## 2022-06-21 DIAGNOSIS — R29810 Facial weakness: Secondary | ICD-10-CM | POA: Diagnosis present

## 2022-06-21 DIAGNOSIS — F03918 Unspecified dementia, unspecified severity, with other behavioral disturbance: Principal | ICD-10-CM | POA: Diagnosis present

## 2022-06-21 DIAGNOSIS — C3490 Malignant neoplasm of unspecified part of unspecified bronchus or lung: Secondary | ICD-10-CM | POA: Diagnosis present

## 2022-06-21 DIAGNOSIS — E118 Type 2 diabetes mellitus with unspecified complications: Secondary | ICD-10-CM | POA: Diagnosis present

## 2022-06-21 DIAGNOSIS — I4510 Unspecified right bundle-branch block: Secondary | ICD-10-CM | POA: Diagnosis present

## 2022-06-21 DIAGNOSIS — Z66 Do not resuscitate: Secondary | ICD-10-CM | POA: Diagnosis present

## 2022-06-21 DIAGNOSIS — G9340 Encephalopathy, unspecified: Secondary | ICD-10-CM | POA: Diagnosis present

## 2022-06-21 DIAGNOSIS — Z7982 Long term (current) use of aspirin: Secondary | ICD-10-CM

## 2022-06-21 DIAGNOSIS — I2510 Atherosclerotic heart disease of native coronary artery without angina pectoris: Secondary | ICD-10-CM | POA: Diagnosis present

## 2022-06-21 DIAGNOSIS — G9341 Metabolic encephalopathy: Secondary | ICD-10-CM | POA: Diagnosis present

## 2022-06-21 DIAGNOSIS — N400 Enlarged prostate without lower urinary tract symptoms: Secondary | ICD-10-CM | POA: Diagnosis present

## 2022-06-21 DIAGNOSIS — I350 Nonrheumatic aortic (valve) stenosis: Secondary | ICD-10-CM | POA: Diagnosis present

## 2022-06-21 DIAGNOSIS — Z7984 Long term (current) use of oral hypoglycemic drugs: Secondary | ICD-10-CM

## 2022-06-21 DIAGNOSIS — I7143 Infrarenal abdominal aortic aneurysm, without rupture: Secondary | ICD-10-CM | POA: Diagnosis present

## 2022-06-21 DIAGNOSIS — Z87891 Personal history of nicotine dependence: Secondary | ICD-10-CM

## 2022-06-21 DIAGNOSIS — R4182 Altered mental status, unspecified: Principal | ICD-10-CM

## 2022-06-21 LAB — RPR: RPR Ser Ql: NONREACTIVE

## 2022-06-21 NOTE — ED Triage Notes (Signed)
Pt to ED via [facility] from home for altered mental status. Pt was seen for same yesterday.  Per EMS pt states that pt was sent to ED for being altered and combative and they state that he is the same this morning.  Pt is alert, not responding to question. Pt has multiple skin tears and bruising in various states of healing over face and body.

## 2022-06-21 NOTE — TOC Progression Note (Signed)
Transition of Care [facility]) - Progression Note    Patient Details  Name: Jordan [name] MRN: [id number] Date of Birth: 06-30-1923  [facility] CM/SW Contact  Boneta Lucks, RN Phone Number: 06/21/2022, 1:53 PM  Clinical Narrative:   Patient discharged from ED last night, TOC got a message to set up home health. Multiple agencies contact for [place] with [facility] accepted the referral.       Expected Discharge Plan and Services      Expected Discharge Date: 06/20/22

## 2022-06-22 ENCOUNTER — Encounter (HOSPITAL_COMMUNITY): Payer: Self-pay | Admitting: Emergency Medicine

## 2022-06-22 ENCOUNTER — Emergency Department (HOSPITAL_COMMUNITY): Payer: Medicare Other

## 2022-06-22 ENCOUNTER — Other Ambulatory Visit: Payer: Self-pay

## 2022-06-22 DIAGNOSIS — F03918 Unspecified dementia, unspecified severity, with other behavioral disturbance: Secondary | ICD-10-CM

## 2022-06-22 DIAGNOSIS — I10 Essential (primary) hypertension: Secondary | ICD-10-CM

## 2022-06-22 DIAGNOSIS — E785 Hyperlipidemia, unspecified: Secondary | ICD-10-CM | POA: Diagnosis not present

## 2022-06-22 DIAGNOSIS — R4182 Altered mental status, unspecified: Secondary | ICD-10-CM

## 2022-06-22 DIAGNOSIS — Z7189 Other specified counseling: Secondary | ICD-10-CM | POA: Diagnosis not present

## 2022-06-22 DIAGNOSIS — E118 Type 2 diabetes mellitus with unspecified complications: Secondary | ICD-10-CM

## 2022-06-22 DIAGNOSIS — G9341 Metabolic encephalopathy: Secondary | ICD-10-CM

## 2022-06-22 LAB — CBC WITH DIFFERENTIAL/PLATELET
Abs Immature Granulocytes: 0.02 10*3/uL (ref 0.00–0.07)
Abs Immature Granulocytes: 0.02 10*3/uL (ref 0.00–0.07)
Basophils Absolute: 0 10*3/uL (ref 0.0–0.1)
Basophils Absolute: 0 10*3/uL (ref 0.0–0.1)
Basophils Relative: 0 %
Basophils Relative: 1 %
Eosinophils Absolute: 0 10*3/uL (ref 0.0–0.5)
Eosinophils Absolute: 0.1 10*3/uL (ref 0.0–0.5)
Eosinophils Relative: 1 %
Eosinophils Relative: 1 %
HCT: 32.1 % — ABNORMAL LOW (ref 39.0–52.0)
HCT: 32.5 % — ABNORMAL LOW (ref 39.0–52.0)
Hemoglobin: 10.3 g/dL — ABNORMAL LOW (ref 13.0–17.0)
Hemoglobin: 10.3 g/dL — ABNORMAL LOW (ref 13.0–17.0)
Immature Granulocytes: 0 %
Immature Granulocytes: 0 %
Lymphocytes Relative: 15 %
Lymphocytes Relative: 17 %
Lymphs Abs: 1 10*3/uL (ref 0.7–4.0)
Lymphs Abs: 1.1 10*3/uL (ref 0.7–4.0)
MCH: 29.2 pg (ref 26.0–34.0)
MCH: 29.7 pg (ref 26.0–34.0)
MCHC: 31.7 g/dL (ref 30.0–36.0)
MCHC: 32.1 g/dL (ref 30.0–36.0)
MCV: 92.1 fL (ref 80.0–100.0)
MCV: 92.5 fL (ref 80.0–100.0)
Monocytes Absolute: 0.6 10*3/uL (ref 0.1–1.0)
Monocytes Absolute: 0.8 10*3/uL (ref 0.1–1.0)
Monocytes Relative: 10 %
Monocytes Relative: 12 %
Neutro Abs: 4.4 10*3/uL (ref 1.7–7.7)
Neutro Abs: 4.8 10*3/uL (ref 1.7–7.7)
Neutrophils Relative %: 71 %
Neutrophils Relative %: 72 %
Platelets: 292 10*3/uL (ref 150–400)
Platelets: 319 10*3/uL (ref 150–400)
RBC: 3.47 MIL/uL — ABNORMAL LOW (ref 4.22–5.81)
RBC: 3.53 MIL/uL — ABNORMAL LOW (ref 4.22–5.81)
RDW: 13.6 % (ref 11.5–15.5)
RDW: 13.6 % (ref 11.5–15.5)
WBC: 6.2 10*3/uL (ref 4.0–10.5)
WBC: 6.7 10*3/uL (ref 4.0–10.5)
nRBC: 0 % (ref 0.0–0.2)
nRBC: 0 % (ref 0.0–0.2)

## 2022-06-22 LAB — BASIC METABOLIC PANEL
Anion gap: 11 (ref 5–15)
Anion gap: 5 (ref 5–15)
BUN: 31 mg/dL — ABNORMAL HIGH (ref 8–23)
BUN: 33 mg/dL — ABNORMAL HIGH (ref 8–23)
CO2: 23 mmol/L (ref 22–32)
CO2: 27 mmol/L (ref 22–32)
Calcium: 8.5 mg/dL — ABNORMAL LOW (ref 8.9–10.3)
Calcium: 8.6 mg/dL — ABNORMAL LOW (ref 8.9–10.3)
Chloride: 102 mmol/L (ref 98–111)
Chloride: 106 mmol/L (ref 98–111)
Creatinine, Ser: 1.06 mg/dL (ref 0.61–1.24)
Creatinine, Ser: 1.27 mg/dL — ABNORMAL HIGH (ref 0.61–1.24)
GFR, Estimated: 51 mL/min — ABNORMAL LOW (ref >60)
GFR, Estimated: >60 mL/min (ref >60)
Glucose, Bld: 173 mg/dL — ABNORMAL HIGH (ref 70–99)
Glucose, Bld: 92 mg/dL (ref 70–99)
Potassium: 4.2 mmol/L (ref 3.5–5.1)
Potassium: 4.3 mmol/L (ref 3.5–5.1)
Sodium: 136 mmol/L (ref 135–145)
Sodium: 138 mmol/L (ref 135–145)

## 2022-06-22 LAB — COMPREHENSIVE METABOLIC PANEL
ALT: 22 U/L (ref 0–44)
AST: 44 U/L — ABNORMAL HIGH (ref 15–41)
Albumin: 2.9 g/dL — ABNORMAL LOW (ref 3.5–5.0)
Alkaline Phosphatase: 90 U/L (ref 38–126)
Anion gap: 8 (ref 5–15)
BUN: 32 mg/dL — ABNORMAL HIGH (ref 8–23)
CO2: 23 mmol/L (ref 22–32)
Calcium: 8.3 mg/dL — ABNORMAL LOW (ref 8.9–10.3)
Chloride: 105 mmol/L (ref 98–111)
Creatinine, Ser: 1.17 mg/dL (ref 0.61–1.24)
GFR, Estimated: 57 mL/min — ABNORMAL LOW (ref >60)
Glucose, Bld: 167 mg/dL — ABNORMAL HIGH (ref 70–99)
Potassium: 5.7 mmol/L — ABNORMAL HIGH (ref 3.5–5.1)
Sodium: 136 mmol/L (ref 135–145)
Total Bilirubin: 1.2 mg/dL (ref 0.3–1.2)
Total Protein: 5.9 g/dL — ABNORMAL LOW (ref 6.5–8.1)

## 2022-06-22 LAB — URINALYSIS, COMPLETE (UACMP) WITH MICROSCOPIC
Bilirubin Urine: NEGATIVE
Glucose, UA: NEGATIVE mg/dL
Hgb urine dipstick: NEGATIVE
Ketones, ur: NEGATIVE mg/dL
Leukocytes,Ua: NEGATIVE
Nitrite: NEGATIVE
Protein, ur: NEGATIVE mg/dL
Specific Gravity, Urine: 1.008 (ref 1.005–1.030)
pH: 7 (ref 5.0–8.0)

## 2022-06-22 LAB — TSH: TSH: 1.357 u[IU]/mL (ref 0.350–4.500)

## 2022-06-22 LAB — GLUCOSE, CAPILLARY
Glucose-Capillary: 100 mg/dL — ABNORMAL HIGH (ref 70–99)
Glucose-Capillary: 151 mg/dL — ABNORMAL HIGH (ref 70–99)

## 2022-06-22 LAB — CBG MONITORING, ED
Glucose-Capillary: 145 mg/dL — ABNORMAL HIGH (ref 70–99)
Glucose-Capillary: 72 mg/dL (ref 70–99)

## 2022-06-22 LAB — HEMOGLOBIN A1C
Hgb A1c MFr Bld: 7.2 % — ABNORMAL HIGH (ref 4.8–5.6)
Mean Plasma Glucose: 160 mg/dL

## 2022-06-22 LAB — MAGNESIUM: Magnesium: 2.5 mg/dL — ABNORMAL HIGH (ref 1.7–2.4)

## 2022-06-22 MED ORDER — ENALAPRIL MALEATE 10 MG PO TABS
10.0000 mg | ORAL_TABLET | Freq: Every day | ORAL | Status: DC
  Administered 2022-06-22 – 2022-07-06 (×11): 10 mg via ORAL
  Filled 2022-06-22 (×3): qty 1
  Filled 2022-06-22 (×2): qty 4
  Filled 2022-06-22 (×2): qty 1
  Filled 2022-06-22: qty 4
  Filled 2022-06-22 (×6): qty 1
  Filled 2022-06-22 (×2): qty 4

## 2022-06-22 MED ORDER — ACETAMINOPHEN 650 MG RE SUPP: 650.0000 mg | Freq: Four times a day (QID) | RECTAL | Status: DC | PRN

## 2022-06-22 MED ORDER — HYDRALAZINE HCL 20 MG/ML IJ SOLN: 5.0000 mg | Freq: Three times a day (TID) | INTRAMUSCULAR | Status: DC | PRN

## 2022-06-22 MED ORDER — TAMSULOSIN HCL 0.4 MG PO CAPS
0.4000 mg | ORAL_CAPSULE | Freq: Every day | ORAL | Status: DC
  Administered 2022-06-22 – 2022-07-06 (×13): 0.4 mg via ORAL
  Filled 2022-06-22 (×13): qty 1

## 2022-06-22 MED ORDER — ONDANSETRON HCL 4 MG/2ML IJ SOLN: 4.0000 mg | Freq: Four times a day (QID) | INTRAMUSCULAR | Status: DC | PRN

## 2022-06-22 MED ORDER — MORPHINE SULFATE (PF) 2 MG/ML IV SOLN
1.0000 mg | INTRAVENOUS | Status: DC | PRN
  Administered 2022-06-22 – 2022-06-25 (×6): 1 mg via INTRAVENOUS
  Filled 2022-06-22 (×7): qty 1

## 2022-06-22 MED ORDER — LORAZEPAM 0.5 MG PO TABS
0.5000 mg | ORAL_TABLET | Freq: Two times a day (BID) | ORAL | Status: DC | PRN
  Administered 2022-06-22: 0.5 mg via ORAL
  Filled 2022-06-22 (×2): qty 1

## 2022-06-22 MED ORDER — PRAVASTATIN SODIUM 40 MG PO TABS
80.0000 mg | ORAL_TABLET | Freq: Every day | ORAL | Status: DC
  Administered 2022-06-22 – 2022-07-06 (×13): 80 mg via ORAL
  Filled 2022-06-22 (×15): qty 2

## 2022-06-22 MED ORDER — ACETAMINOPHEN 325 MG PO TABS
650.0000 mg | ORAL_TABLET | Freq: Four times a day (QID) | ORAL | Status: DC | PRN
  Administered 2022-06-25 – 2022-06-26 (×2): 650 mg via ORAL
  Filled 2022-06-22 (×2): qty 2

## 2022-06-22 MED ORDER — BUDESONIDE 3 MG PO CPEP
3.0000 mg | ORAL_CAPSULE | Freq: Every day | ORAL | Status: DC
  Administered 2022-06-22 – 2022-07-06 (×13): 3 mg via ORAL
  Filled 2022-06-22 (×17): qty 1

## 2022-06-22 MED ORDER — FINASTERIDE 5 MG PO TABS
5.0000 mg | ORAL_TABLET | Freq: Every day | ORAL | Status: DC
  Administered 2022-06-22 – 2022-07-06 (×13): 5 mg via ORAL
  Filled 2022-06-22 (×15): qty 1

## 2022-06-22 MED ORDER — LORAZEPAM 2 MG/ML IJ SOLN
0.5000 mg | Freq: Once | INTRAMUSCULAR | Status: AC
  Administered 2022-06-22: 0.5 mg via INTRAVENOUS
  Filled 2022-06-22: qty 1

## 2022-06-22 MED ORDER — HEPARIN SODIUM (PORCINE) 5000 UNIT/ML IJ SOLN
5000.0000 [IU] | Freq: Three times a day (TID) | INTRAMUSCULAR | Status: DC
  Administered 2022-06-22 – 2022-07-06 (×43): 5000 [IU] via SUBCUTANEOUS
  Filled 2022-06-22 (×42): qty 1

## 2022-06-22 MED ORDER — INSULIN ASPART 100 UNIT/ML IJ SOLN
0.0000 [IU] | Freq: Three times a day (TID) | INTRAMUSCULAR | Status: DC
  Administered 2022-06-22 – 2022-06-23 (×3): 2 [IU] via SUBCUTANEOUS
  Administered 2022-06-27: 3 [IU] via SUBCUTANEOUS
  Administered 2022-06-27: 2 [IU] via SUBCUTANEOUS
  Administered 2022-06-28: 3 [IU] via SUBCUTANEOUS
  Administered 2022-06-29: 2 [IU] via SUBCUTANEOUS
  Administered 2022-06-29: 3 [IU] via SUBCUTANEOUS
  Administered 2022-06-30: 5 [IU] via SUBCUTANEOUS
  Administered 2022-06-30 – 2022-07-01 (×2): 3 [IU] via SUBCUTANEOUS
  Administered 2022-07-01: 2 [IU] via SUBCUTANEOUS
  Administered 2022-07-01: 3 [IU] via SUBCUTANEOUS
  Administered 2022-07-02 – 2022-07-03 (×2): 2 [IU] via SUBCUTANEOUS
  Administered 2022-07-03 (×2): 3 [IU] via SUBCUTANEOUS
  Administered 2022-07-04: 5 [IU] via SUBCUTANEOUS
  Administered 2022-07-05: 2 [IU] via SUBCUTANEOUS
  Administered 2022-07-05: 3 [IU] via SUBCUTANEOUS
  Administered 2022-07-06: 11 [IU] via SUBCUTANEOUS
  Filled 2022-06-22: qty 1

## 2022-06-22 MED ORDER — ONDANSETRON HCL 4 MG PO TABS: 4.0000 mg | ORAL_TABLET | Freq: Four times a day (QID) | ORAL | Status: DC | PRN

## 2022-06-22 MED ORDER — MIRABEGRON ER 25 MG PO TB24
25.0000 mg | ORAL_TABLET | Freq: Every day | ORAL | Status: DC
  Administered 2022-06-22 – 2022-07-06 (×12): 25 mg via ORAL
  Filled 2022-06-22 (×15): qty 1

## 2022-06-22 MED ORDER — SODIUM CHLORIDE 0.9 % IV SOLN: INTRAVENOUS | Status: DC

## 2022-06-22 MED ORDER — ASPIRIN 81 MG PO TBEC
81.0000 mg | DELAYED_RELEASE_TABLET | Freq: Every day | ORAL | Status: DC
  Administered 2022-06-22 – 2022-07-06 (×12): 81 mg via ORAL
  Filled 2022-06-22 (×15): qty 1

## 2022-06-22 MED ORDER — MEMANTINE HCL 10 MG PO TABS
10.0000 mg | ORAL_TABLET | Freq: Two times a day (BID) | ORAL | Status: DC
  Administered 2022-06-22 – 2022-07-06 (×26): 10 mg via ORAL
  Filled 2022-06-22 (×28): qty 1

## 2022-06-22 MED ORDER — OXYCODONE HCL 5 MG PO TABS
5.0000 mg | ORAL_TABLET | ORAL | Status: DC | PRN
  Administered 2022-06-26: 5 mg via ORAL
  Filled 2022-06-22: qty 1

## 2022-06-22 NOTE — Assessment & Plan Note (Signed)
-   Hold metformin - Sliding scale coverage - Continue to monitor

## 2022-06-22 NOTE — ED Notes (Signed)
Dr. Xu at bedside

## 2022-06-22 NOTE — ED Notes (Signed)
Mittens placed on pt hands, seizure pads placed on stretcher in attempt to protect pt from getting legs stuck in side rails, pt repositioned in bed, warm blanket applied, pt lying in bed with eyes closed at this time. Will continue to monitor.

## 2022-06-22 NOTE — Assessment & Plan Note (Signed)
-   Agitation and behavioral disturbance for 3 days - CT head today shows no acute intracranial abnormality - MRI brain on December 5 shows no acute intracranial process.  No evidence of acute or subacute infarct. - Patient failed outpatient management with Remeron, Ativan, Namenda - Consult neuro - Control pain, attempt to reorient as needed, as needed Ativan - Continue to monitor

## 2022-06-22 NOTE — Progress Notes (Addendum)
Patient admitted early this morning, detail please refer HPI. Briefly history of dementia, on Aricept and Namenda, baseline lives at home perform ADLs independently, history of hypertension, hyperlipidemia, CAD status post DES stent, NIDDM2 sent to the hospital the second time in two days due to behavioral disturbance.It is reported that he has been combative and agitated for 3 days. He was brought to the ER on the fifth,    12/5: ct head showed  small chronic lacunar infarct within the right caudate nucleus, and possible small chronic lacunar infarct within the left lentiform nucleus. Moderate cerebral atrophy.  Small left forehead hematoma. Mri brain no acute findings,  12/5 CT angio headneck1. No evidence of large vessel occlusion. 2. Severe focal stenoses in the distal P1 and proximal P2 segments of the left PCA. 3. Approximately 75% stenosis at the origin of the right ICA. Peripheral  4. subsolid pulmonary opacity has increased in size compared to 2017 and is suspicious for malignancy. Recommend further evaluation with a dedicated chest CT. 12/5 cxr no acute findings  He with  was discharged home on 12/5  but brought back due to the same complaints, family request placement.  Ua no sign of infection WBC 6.7, hemoglobin 10.3 Potassium 5.7, repeat 4.2 Glucose 167 BUN 33, creatinine 1.27, AST 44 Urine is clear in canister, bladder scan is 200  He is weak and deconditioned, diffuse bruising ( left facial bruising as well), skin tears, he is oriented to self, does attempt to follow command intermittently, no agitation currently,   Plan , repeat bmp, start hydration, check bladder scan,  Check blood culture for completeness.  will have speech to see, will have Pt to see, palliative care consulted .  Daughter reports patient has macular degeneration, legally blind, started to loose weight about a year ago, becomeweaknes about two months ago when he started to use a walker, he does has  short term memory issues but has been very social and going to  church once a month, last a month ago, he fell on thanksgiving day , that was his only fall. Daughter reports he is usually with a sound mind and able to discuss stock market with her but he work up confused on 12/5 this is a sudden change. Add EEG   Called daughter [name] and updated her .

## 2022-06-22 NOTE — Assessment & Plan Note (Signed)
-   Continue Vasotec

## 2022-06-22 NOTE — H&P (Signed)
History and Physical    Patient: Jordan [name] WCH:[id number] DOB: 1922/09/02 DOA: 06/21/2022 DOS: the patient was seen and examined on 06/22/2022 PCP: Asencion Noble, MD  Patient coming from: Home  Chief Complaint:  Chief Complaint  Patient presents with   Altered Mental Status   HPI: Jordan [name] is a 87 y.o. male with medical history significant of aortic stenosis, BPH, CAD, essential hypertension, hyperlipidemia, diabetes mellitus type 2, and more presents the ED with a chief complaint of behavioral disturbance.  Patient is unfortunately not able to provide any history.  It is reported that he has been combative and agitated for 3 days.  He was brought to the ER on the fifth, which day he had a workup for stroke.  Hospitalist was consulted on patient but patient was ultimately discharged home that day.  Returns to the ER last night for the same complaint.  Per his last cardiology note patient lives at home and is functional with basic ADLs.  His daughter had noted that he was getting slower and with less stamina over the last 2 years.  He is short of breath walking to the mailbox.  No angina or syncope.  Family may ultimately need to discuss placement.  Review of systems cannot be obtained secondary to patient's altered mental status. Past Medical History:  Diagnosis Date   Aortic stenosis    Arthritis    BPH (benign prostatic hypertrophy)    CAD (coronary artery disease)    DES to RCA 2009   Diverticulosis of colon    Essential hypertension    Fecal urgency    History of adenomatous polyp of colon    Tubular adenomas 2016   Hyperlipidemia    Lower urinary tract symptoms (LUTS)    Type 2 diabetes mellitus ([place])    Wears glasses    Past Surgical History:  Procedure Laterality Date   BIOPSY  10/02/2019   Procedure: BIOPSY;  Surgeon: Rogene Houston, MD;  [facility];  Service: Endoscopy;;   CATARACT EXTRACTION W/ INTRAOCULAR LENS  IMPLANT, BILATERAL      COLONOSCOPY  last one 11/16/2014   polpectomy   CORONARY ANGIOPLASTY WITH STENT PLACEMENT  12-23-2007  dr hochrein   (abnormal myoview)  PCI w/ DES x1 pRCA (95%),  pCFX 25%,  mCFX 40-50%,  after PDA 30%,  prox, mid, and ostial LAD  30% calcification,  LM diffuse 25% calcification,  ef 60%   EVALUATION UNDER ANESTHESIA WITH ANAL FISTULECTOMY [name] 06/03/2015   Procedure: ANAL EXAM UNDER ANESTHESIA ,FISTULOTOMY ;  Surgeon: [name], MD;  Location: [place];  Service: General;  Laterality: [name];   FLEXIBLE SIGMOIDOSCOPY [name] 10/02/2019   Procedure: FLEXIBLE SIGMOIDOSCOPY;  Surgeon: Rogene Houston, MD;  [facility];  Service: Endoscopy;  Laterality: [name];  230   INGUINAL HERNIA REPAIR Right 2001   TRANSTHORACIC ECHOCARDIOGRAM  12-06-2007   normal LVF, ef 60%/  mild to moderate  AV calcification without stenosis,  mild AR and TR,  mild LAE   Social History:  reports that he quit smoking about 39 years ago. His smoking use included cigarettes. He has never used smokeless tobacco. He reports that he does not drink alcohol and does not use drugs.  No Known Allergies  Family History  Problem Relation Age of Onset   Heart disease Brother    Colon cancer Neg Hx     Prior to Admission medications   Medication Sig Start Date End  Date Taking? Authorizing Provider  aspirin 81 MG tablet Take 1 tablet (81 mg total) by mouth daily with breakfast. 06/20/22   Emokpae, Courage, MD  budesonide (ENTOCORT EC) 3 MG 24 hr capsule TAKE 1 CAPSULE BY MOUTH DAILY. 06/12/22   [name], MD  enalapril (VASOTEC) 10 MG tablet Take 10 mg by mouth daily.    [provider]  finasteride (PROSCAR) 5 MG tablet Take 1 tablet (5 mg total) by mouth daily. as directed 05/04/21   [name], [name], MD  LORazepam (ATIVAN) 0.5 MG tablet Take 1 tablet (0.5 mg total) by mouth every 12 (twelve) hours as needed for anxiety or sleep. 06/20/22 06/20/23  Roxan Hockey, MD  memantine  (NAMENDA) 10 MG tablet Take 10 mg by mouth 2 (two) times daily. 04/03/22   [provider]  metFORMIN (GLUCOPHAGE) 500 MG tablet Take 500 mg by mouth at bedtime.     [provider]  mirabegron ER (MYRBETRIQ) 25 MG TB24 tablet Take 1 tablet (25 mg total) by mouth daily. 03/17/22   [name], [name], MD  mirtazapine (REMERON) 15 MG tablet Take 0.5 tablets (7.5 mg total) by mouth at bedtime. 06/20/22   Roxan Hockey, MD  Multiple Vitamins-Minerals (PRESERVISION AREDS PO) Take 1 tablet by mouth daily.    [provider]  pravastatin (PRAVACHOL) 80 MG tablet Take 80 mg by mouth daily.    [provider]  silodosin (RAPAFLO) 8 MG CAPS capsule Take 1 capsule (8 mg total) by mouth in the morning and at bedtime. 03/17/22   Cleon Gustin, MD    Physical Exam: Vitals:   06/22/22 0330 06/22/22 0349 06/22/22 0400 06/22/22 0502  BP: (!) 145/51 (!) 150/69 (!) 142/58 (!) 138/56  Pulse: 92 86 86 (!) 59  Resp: 18 17 18 17   Temp: 97.9 F (36.6 C)     TempSrc: Oral     SpO2: 98% 100% 100% 100%  Weight:      Height:       1.  General: Patient lying supine in bed,  no acute distress   2. Psychiatric: Hypersomnolence secondary to Ativan   3. Neurologic: Hypersomnolent, nonverbal, not following commands   4. HEENMT:  Head is atraumatic, normocephalic, pupils reactive to light, neck is supple, trachea is midline, mucous membranes are moist   5. Respiratory : Lungs are clear to auscultation bilaterally without wheezing, rhonchi, rales, no cyanosis, no increase in work of breathing or accessory muscle use   6. Cardiovascular : Heart rate normal, rhythm is regular, murmur present, rubs or gallops, no peripheral edema, peripheral pulses palpated   7. Gastrointestinal:  Abdomen is soft, nondistended, nontender to palpation bowel sounds active, no masses or organomegaly palpated   8. Skin:  Skin tears present   9.Musculoskeletal:  No acute deformities or  trauma, no asymmetry in tone, no peripheral edema, peripheral pulses palpated, no tenderness to palpation in the extremities  Data Reviewed: In the ED Temp 97.4, heart rate 31-88, respiratory rate 12-16, blood pressure 130/46-135/87 No leukocytosis with white blood cell count of 6.2, hemoglobin 10.3, platelets 319 Chemistry shows elevated creatinine at 1.27, but is actually lower than last measured, prior to that patient creatinine is generally between 0.7 and 0.8 - Slight hyperglycemia 173 CT head shows no acute intracranial abnormality.  Advanced parenchymal volume loss with ex vacuo dilation of the ventricles again noted with white matter changes that likely reflect small vessel ischemia Admission was requested to the hospitalist service as patient  failed patient treatment, and family is unable to take care of him when he is in this agitated state Patient given Ativan prior to admission unable to provide any history at all It sounds like when he was awake he would not been able to provide history anyways Admitted for acute metabolic encephalopathy  Assessment and Plan: * Acute metabolic encephalopathy - Agitation and behavioral disturbance for 3 days - CT head today shows no acute intracranial abnormality - MRI brain on December 5 shows no acute intracranial process.  No evidence of acute or subacute infarct. - Patient failed outpatient management with Remeron, Ativan, Namenda - Consult neuro - Control pain, attempt to reorient as needed, as needed Ativan - Continue to monitor  Dementia with behavioral disturbance (HCC) - Apparently patient did not tolerate Aricept - Continue Namenda - Holding Remeron at this time - Continue Ativan Continue to monitor  Hyperlipidemia - Continue statin  Hypertension - Continue Vasotec  Type 2 diabetes mellitus with complication ([place]) - Hold metformin - Sliding scale coverage - Continue to monitor      Advance Care Planning:   Code  Status: Full Code   Consults: Neuro  Family Communication: No family at bedside  Severity of Illness: The appropriate patient status for this patient is OBSERVATION. Observation status is judged to be reasonable and necessary in order to provide the required intensity of service to ensure the patient's safety. The patient's presenting symptoms, physical exam findings, and initial radiographic and laboratory data in the context of their medical condition is felt to place them at decreased risk for further clinical deterioration. Furthermore, it is anticipated that the patient will be medically stable for discharge from the hospital within 2 midnights of admission.   Author: Rolla Plate, DO 06/22/2022 5:39 AM  For on call review [email or web address].

## 2022-06-22 NOTE — Assessment & Plan Note (Signed)
Continue statin. 

## 2022-06-22 NOTE — ED Notes (Signed)
Pt given lunch tray.

## 2022-06-22 NOTE — ED Notes (Addendum)
Pt attempting to get out of bed, has pulled off all monitoring devices and gown, pt family (daughter) is at bedside, says pt was seen here last night for the same, and was acting this way last night as well, reports that "2 days ago he was not like this"- pt has several skin tears to both arms, bruising to left side of face, various stages of healing as evidence by variety of colors including tan, brown, blue, pt also has discoloration and bruising to arms bilaterally. Daughter asking why he was discharged last night, informed daughter that I did not know the answer to that question as I was not here and the computers have been down, so I cannot look anything up at this time. Pt redirected several times, pt now lying in bed covered with warm blanket, pt was not hooked back up to monitor as he will not keep in place.

## 2022-06-22 NOTE — Assessment & Plan Note (Signed)
-   Apparently patient did not tolerate Aricept - Continue Namenda - Holding Remeron at this time - Continue Ativan Continue to monitor

## 2022-06-22 NOTE — ED Notes (Signed)
Pt has hx of dementia, when asked daughter if pt has dementia, she says no one has told me that, reports Dr Willey Blade put pt on Aricept years ago, but gave him nightmares, so it was changed to [place], pt daughter reports Dr Willey Blade told her it was for short term memory problems, but never said he had dementia or explained anything.

## 2022-06-22 NOTE — Progress Notes (Signed)
Patient sleeping since being admitted to the floor and mittens are on. Tamsulosin not given due to patient not being alert enough to take oral medications.

## 2022-06-22 NOTE — Progress Notes (Signed)
Patient bed alarm going off and he was yelling "Thayer Jew" when I walked in room he had legs over bedrail and daughter was standing at his side. Assisted legs back over rail and into bed. Vitals checked and Ativan was given. Will re-check BP when patient calms down as it was elevated and patient would not stop moving arm.

## 2022-06-22 NOTE — ED Notes (Signed)
Pt still sleeping, remains restless while sleeping, will intermittently attempt to remove monitoring devices. Eyes remain closed.

## 2022-06-22 NOTE — ED Notes (Signed)
Report given to [facility], LPN on 354.

## 2022-06-22 NOTE — ED Notes (Signed)
Pt awake but drowsy, unable to safely take PO at this time. MD made aware.

## 2022-06-22 NOTE — ED Notes (Addendum)
Pt continues to get out of bed, pulling off gown and monitoring devices. Pt putting legs through side rails of bed- redirection attempted multiple times- Dr Stark Jock made aware- new orders received.

## 2022-06-22 NOTE — ED Notes (Addendum)
Pt family member stuck head out of door and stated, "He is trying to get out of bed again". This nurse goes to room,This nurse redirects pt  Pt lays back down in bed and closes eyes.

## 2022-06-22 NOTE — Consult Note (Signed)
Consultation Note Date: 06/22/2022   Patient Name: Jordan [name]  DOB: 25-May-1923  MRN: [id number]  Age / Sex: 88 y.o., male  PCP: Asencion Noble, MD Referring Physician: Florencia Reasons, MD  Reason for Consultation:  "goals of care discussion"  HPI/Patient Profile: 88 y.o. male  with past medical history of mild cognitive dysfunction with short-term memory loss, aortic stenosis not a candidate for TAVR, BPH, coronary artery disease, hypertension, DM 2, diastolic dysfunction, bilateral hearing loss, macular degeneration, admitted on 06/21/2022 with altered mental status.  He has been agitated and combative for several days.  CT scan and MRI did not show any acute findings although there is advanced cerebral atrophy.  There was evidence of small chronic lacunar infarcts, and small left forehead hematoma.  CT angio of the head and neck showed 75% stenosis of his right ICA as well as a peripheral subsolid pulmonary opacity that has increased in size compared to 2017 and is suspicious for malignancy. Urine is clear in no acute findings on chest x-ray, no urinary retention, last bowel movement was yesterday.  EEG was suggestive of a moderate encephalopathy but nonspecific as to etiology there was no epileptiform abnormalities.  Palliative medicine consulted for goals of care discussion.  Primary Decision Maker HCPOA -daughter, [name] will bring a copy of the HCPOA in give to nursing to make a copy and placed in his chart  Discussion:  I have reviewed medical records including [place], progress notes from this and prior admissions, labs and imaging from this and previous encounters.    On evaluation patient is sleeping.  He appears very frail.  His skin is fragile he has many skin tears.  He has a large bruise on the left side of his face going from his forehead to below his eye.  He is sleeping but he  appears uncomfortable with a furrowed brow.  I introduced Palliative Medicine as specialized medical care for people living with serious illness. It focuses on providing relief from the symptoms and stress of a serious illness. The goal is to improve quality of life for both the patient and the family.  I called his daughter Jordan [name] for further discussion.  We discussed a brief life review of the patient.  He works as a Associate Professor for [employer].  He is known to be very kindhearted and compassionate.  Always willing to help anyone in need.  Very loving.  As far as functional and nutritional status-prior to the sudden change in his mental status approximately 5 days ago patient was able to ambulate and complete most of his ADLs on his own.  He did have a fall on Thanksgiving day, due to not using his walker this is where his facial hematoma came from.  His daughter reports short-term memory loss but not advanced dementia.  He is normally able to have a meaningful conversation. 2 weeks ago he was giving her sound instructions on what to do with his stocks should something happen to him.  He has  had some decline in his functional status over this last year, with increasing shortness of breath with ambulation.  He has also had loss of appetite and unintentional weight loss weight loss.  We discussed patient's current illness and what it means in the larger context of patient's on-going co-morbidities.  Jordan [name] is understandably frustrated at the lack of clear findings regarding what is causing Mr. Caver's changes in his mental status. We discussed the fact that he potentially has lung cancer- Jordan [name] notes that he would not likely tolerate treatment for lung cancer so has not requested further workup.   Natural disease trajectory and expectations at EOL were discussed. Jordan [name] is clear that she does not want her Dad to suffer. She shares that he is frail and wouldn't want prolonged illness or  suffering.   Advance directives, scope of care, and concepts specific to code status were discussed. Patient would not want resuscitation if his heart were to stop and he were to stop breathing.  If he had a life-threatening complication he would not want advanced medical interventions, he would be preferred to be kept comfortable until end-of-life.  Discussed with patient/family the importance of continued conversation with family and the medical providers regarding overall plan of care and treatment options, ensuring decisions are within the context of the patient's values and GOCs.      SUMMARY OF RECOMMENDATIONS -DNR - G0C-continue to attempt to find underlying cause of patient's sudden change in mental status and treat what is treatable with goal be being for patient to return to his previous functional state -Given his history of fall and agitation I am concerned he has untreated pain- I have ordered IV morphine as needed low-dose 1 mg as needed for nonverbal signs of pain including agitation, furrowed brow, or other nonverbal signs of discomfort  Code Status/Advance Care Planning: DNR   Prognosis:   Unable to determine  Discharge Planning: To Be Determined  Primary Diagnoses: Present on Admission:  Acute metabolic encephalopathy  Dementia with behavioral disturbance ([place])  Hypertension  Hyperlipidemia  Type 2 diabetes mellitus with complication ([place])   Review of Systems  Unable to perform ROS: Mental status change    Physical Exam Vitals and nursing note reviewed.  Constitutional:      Comments: Frail, thin  HENT:     Head:     Comments: Large hematoma on left side of face from forehead to below his eye Skin:    Comments: Fragile with multiple skin tears  Neurological:     Comments: Sleeping     Vital Signs: BP (!) 128/36   Pulse 61   Temp 97.6 F (36.4 C) (Axillary)   Resp 17   Ht 5\' 9"  (1.753 m)   Wt 60 kg   SpO2 100%   BMI 19.53 kg/m  Pain Scale:  0-10   Pain Score: Asleep   SpO2: SpO2: 100 % O2 Device:SpO2: 100 % O2 Flow Rate: .   IO: Intake/output summary: No intake or output data in the 24 hours ending 06/22/22 1238  LBM:   Baseline Weight: Weight: 60 kg Most recent weight: Weight: 60 kg       Thank you for this consult. Palliative medicine will continue to follow and assist as needed.  Time Total: 100 minutes  Greater than 50%  of this time was spent counseling and coordinating care related to the above assessment and plan.  Signed by: Mariana Kaufman, AGNP-C Palliative Medicine    Please contact Palliative Medicine Team  phone at [phone] for questions and concerns.  For individual provider: See Shea Evans

## 2022-06-22 NOTE — ED Notes (Signed)
Pt too drowsy to take PO meds at this time. Breathing even and non labored, VSS. Will cont to monitor.

## 2022-06-22 NOTE — ED Notes (Signed)
Pt continues kicking covers off and attempting to sit up, placing legs through side rails, pt also swinging at staff on occasion when trying to get pt to lay back down. Daughter informed nurse that she is going home at this time, to call if anything changes with pt.

## 2022-06-22 NOTE — ED Provider Notes (Signed)
[facility] EMERGENCY DEPARTMENT Provider Note   CSN: [id number] Arrival date & time: 06/21/22  2347     History  Chief Complaint  Patient presents with   Altered Mental Status    Jordan [name] is a 88 y.o. male.  Patient is a 88 year old male with past medical history of hypertension, type 2 diabetes, dementia, hyperlipidemia.  Patient brought by EMS for evaluation of altered mental status.  According to family, patient was more confused and combative this evening.  He was just discharged yesterday after being admitted with similar complaints.  He underwent work-up which was unremarkable.  He became confused and agitated again this evening.  Patient has a little additional history secondary to dementia.  The history is provided by the patient.       Home Medications Prior to Admission medications   Medication Sig Start Date End Date Taking? Authorizing Provider  aspirin 81 MG tablet Take 1 tablet (81 mg total) by mouth daily with breakfast. 06/20/22   Emokpae, Courage, MD  budesonide (ENTOCORT EC) 3 MG 24 hr capsule TAKE 1 CAPSULE BY MOUTH DAILY. 06/12/22   [name], MD  enalapril (VASOTEC) 10 MG tablet Take 10 mg by mouth daily.    [provider]  finasteride (PROSCAR) 5 MG tablet Take 1 tablet (5 mg total) by mouth daily. as directed 05/04/21   [name], [name], MD  LORazepam (ATIVAN) 0.5 MG tablet Take 1 tablet (0.5 mg total) by mouth every 12 (twelve) hours as needed for anxiety or sleep. 06/20/22 06/20/23  Roxan Hockey, MD  memantine (NAMENDA) 10 MG tablet Take 10 mg by mouth 2 (two) times daily. 04/03/22   [provider]  metFORMIN (GLUCOPHAGE) 500 MG tablet Take 500 mg by mouth at bedtime.     [provider]  mirabegron ER (MYRBETRIQ) 25 MG TB24 tablet Take 1 tablet (25 mg total) by mouth daily. 03/17/22   [name], [name], MD  mirtazapine (REMERON) 15 MG tablet Take 0.5 tablets (7.5 mg total) by mouth at bedtime.  06/20/22   Roxan Hockey, MD  Multiple Vitamins-Minerals (PRESERVISION AREDS PO) Take 1 tablet by mouth daily.    [provider]  pravastatin (PRAVACHOL) 80 MG tablet Take 80 mg by mouth daily.    [provider]  silodosin (RAPAFLO) 8 MG CAPS capsule Take 1 capsule (8 mg total) by mouth in the morning and at bedtime. 03/17/22   [name], [name], MD      Allergies    Patient has no known allergies.    Review of Systems   Review of Systems  Unable to perform ROS: Dementia    Physical Exam Updated Vital Signs BP 135/87 (BP Location: Left Arm)   Pulse 88   Temp (!) 97.4 F (36.3 C) (Axillary)   Resp 12   Ht 5\' 9"  (1.753 m)   Wt 60 kg   SpO2 100%   BMI 19.53 kg/m  Physical Exam Vitals and nursing note reviewed.  Constitutional:      General: He is not in acute distress.    Appearance: He is well-developed. He is not diaphoretic.     Comments: Patient is somewhat somnolent, but arousable.  He will answer questions appropriately and follow commands appropriately.  HENT:     Head: Normocephalic and atraumatic.  Cardiovascular:     Rate and Rhythm: Normal rate and regular rhythm.     Heart sounds: No murmur heard.    No friction rub.  Pulmonary:  Effort: Pulmonary effort is normal. No respiratory distress.     Breath sounds: Normal breath sounds. No wheezing or rales.  Abdominal:     General: Bowel sounds are normal. There is no distension.     Palpations: Abdomen is soft.     Tenderness: There is no abdominal tenderness.  Musculoskeletal:        General: Normal range of motion.     Cervical back: Normal range of motion and neck supple.  Skin:    General: Skin is warm and dry.  Neurological:     General: No focal deficit present.     Coordination: Coordination normal.     Comments: Patient moves all 4 extremities with purpose and appears neurologically intact.  Neurologic exam somewhat limited secondary to dementia.     ED Results / Procedures  / Treatments   Labs (all labs ordered are listed, but only abnormal results are displayed) Labs Reviewed - No data to display  EKG ED ECG REPORT   Date: 06/22/2022  Rate: 80  Rhythm: normal sinus rhythm with ventricular trigeminy  QRS Axis: left  Intervals: normal  ST/T Wave abnormalities: nonspecific T wave changes  Conduction Disutrbances:right bundle branch block  Narrative Interpretation:   Old EKG Reviewed: none available and unchanged  I have personally reviewed the EKG tracing and agree with the computerized printout as noted.   Radiology MR BRAIN WO CONTRAST  Result Date: 06/20/2022 CLINICAL DATA:  Left-sided facial droop, generalized weakness, stroke suspected EXAM: MRI HEAD WITHOUT CONTRAST TECHNIQUE: Multiplanar, multiecho pulse sequences of the brain and surrounding structures were obtained without intravenous contrast. COMPARISON:  No prior MRI, correlation is made with CT head 06/20/2022 FINDINGS: Brain: No restricted diffusion to suggest acute or subacute infarct. No acute hemorrhage, mass, mass effect, or midline shift. No hydrocephalus or extra-axial collection. Redemonstrated advanced cerebral atrophy, with ex vacuo dilatation the ventricles. Vascular: Normal arterial flow voids. Skull and upper cervical spine: Evaluation is somewhat motion limited, but marrow signal is grossly normal. Sinuses/Orbits: Mild mucosal thickening in the ethmoid air cells. Other: The mastoids are well aerated. IMPRESSION: No acute intracranial process. No evidence of acute or subacute infarct. Electronically Signed   By: Merilyn Baba M.D.   On: 06/20/2022 16:39   EEG adult  Result Date: 06/20/2022 Landry Corporal, MD     06/20/2022  6:10 PM [facility] Services Routine EEG Report Demographics: Patient Name:   Jordan [name] Date of Birth:   May 11, 1923 Identification Number:   MRN - [id number] Study Times: Duration:   24 minutes Indication(s):  Encephalopathy Technical Summary: This EEG was performed utilizing standard International 10-20 System of electrode placement. Data were obtained, stored, and interpreted utilizing referential montage recording, with reformatting to longitudinal, transverse bipolar, and referential montages as necessary for interpretation. State(s):       Awake      Drowsy      Asleep Activation Procedures: Hyperventilation: Not performed Photic Stimulation: Not performed EEG Description: No normal patterns of awake or sleep were present during this recording. Intermittent generalized theta-delta slowing was present. Excess continuous generalized theta-delta slowing was present. Occasional severe myogenic artifact precluded a more thorough interpretation of this recording. Impression: Abnormal EEG due to:   1) No normal patterns of awake or sleep  2) Intermittent generalized theta-delta slowing  3) Continuous generalized theta-delta slowing  Clinical Correlation: This EEG is suggestive of a moderate encephalopathy, but is nonspecific as to etiology. The absence of epileptiform abnormalities does not  preclude a clinical diagnosis of seizures. Nicolette Bang) [place] Board of Psychiatry and Neurology (ABPN); American Board of Clinical Neurophysiology  (ABCN) [facility] For Inpatient follow-up with [facility] physician please call [facility] [phone]. This is not an outpatient service. Post hospital discharge, please contact hospital directly.   DG Chest Portable 1 View  Result Date: 06/20/2022 CLINICAL DATA:  Altered mental status. EXAM: PORTABLE CHEST 1 VIEW COMPARISON:  02/28/2021 FINDINGS: Midline trachea. Mild cardiomegaly. Atherosclerosis in the transverse aorta. No pleural effusion or pneumothorax. Nonspecific mild interstitial prominence could be related to remote smoking history. No lobar consolidation. IMPRESSION: No acute cardiopulmonary disease. Aortic Atherosclerosis (ICD10-I70.0). Electronically  Signed   By: Abigail Miyamoto M.D.   On: 06/20/2022 12:29   CT ANGIO HEAD NECK W WO CM (CODE STROKE)  Result Date: 06/20/2022 CLINICAL DATA:  Left-sided facial droop. EXAM: CT ANGIOGRAPHY HEAD AND NECK TECHNIQUE: Multidetector CT imaging of the head and neck was performed using the standard protocol during bolus administration of intravenous contrast. Multiplanar CT image reconstructions and MIPs were obtained to evaluate the vascular anatomy. Carotid stenosis measurements (when applicable) are obtained utilizing NASCET criteria, using the distal internal carotid diameter as the denominator. RADIATION DOSE REDUCTION: This exam was performed according to the departmental dose-optimization program which includes automated exposure control, adjustment of the mA and/or kV according to patient size and/or use of iterative reconstruction technique. CONTRAST:  5mL OMNIPAQUE IOHEXOL 350 MG/ML SOLN COMPARISON:  Same day CT brain FINDINGS: CT HEAD FINDINGS See same day CT brain for additional findings. CTA NECK FINDINGS Aortic arch: Standard branching. Imaged portion shows no evidence of aneurysm or dissection. No significant stenosis of the major arch vessel origins. Right carotid system: Approximately 75% stenosis at the origin of the right ICA. Left carotid system: Mild stenosis of the origin of the left ICA. Vertebral arteries: Codominant. No evidence of dissection, stenosis (50% or greater), or occlusion. Skeleton: There is large degenerative pannus at C1-C2 that results in of the spinal canal. There is also a calcified disc extrusion at C5-C6 that results in mild-to-moderate spinal canal narrowing. Other neck: Negative. Upper chest: Peripheral subsolid pulmonary opacity has increased in size compared to 2017 and is suspicious for malignancy. Review of the MIP images confirms the above findings CTA HEAD FINDINGS Anterior circulation: Hyperdense focus seen on same day CT brain correlates with a calcified focal plaque of  uncertain clinical significance. Posterior circulation: There is moderate to severe focal stenosis in the distal P1 and proximal P2 segments of the left PCA (series 5, image 218, 2-5). Venous sinuses: As permitted by contrast timing, patent. Anatomic variants: None Review of the MIP images confirms the above findings IMPRESSION: 1. No evidence of large vessel occlusion. 2. Severe focal stenoses in the distal P1 and proximal P2 segments of the left PCA. 3. Approximately 75% stenosis at the origin of the right ICA. 4. Peripheral subsolid pulmonary opacity has increased in size compared to 2017 and is suspicious for malignancy. Recommend further evaluation with a dedicated chest CT. Electronically Signed   By: Marin Roberts M.D.   On: 06/20/2022 12:19   CT HEAD CODE STROKE WO CONTRAST  Result Date: 06/20/2022 CLINICAL DATA:  Code stroke. Neuro deficit, acute, stroke suspected. Left-sided facial droop. Generalized weakness. Altered mental status. EXAM: CT HEAD WITHOUT CONTRAST TECHNIQUE: Contiguous axial images were obtained from the base of the skull through the vertex without intravenous contrast. RADIATION DOSE REDUCTION: This exam was performed according to the departmental dose-optimization program  which includes automated exposure control, adjustment of the mA and/or kV according to patient size and/or use of iterative reconstruction technique. COMPARISON:  Head CT 08/20/2009. FINDINGS: Brain: Moderate cerebral atrophy. Mild patchy and ill-defined hypoattenuation within the cerebral white matter, nonspecific but compatible with chronic small vessel ischemic disease. Redemonstrated chronic lacunar infarct within the right caudate nucleus. Redemonstrated small chronic lacunar infarct versus prominent perivascular space within the left lentiform nucleus. There is no acute intracranial hemorrhage. No demarcated cortical infarct. No extra-axial fluid collection. No evidence of an intracranial mass. No midline  shift. Vascular: Atherosclerotic calcifications. Small calcified focus at the anterior aspect of the right sylvian fissure, new from the prior head CT head CT of 08/20/2009 and possibly reflecting calcified embolic plaque (series 2, image 16) (series 5, image 19). Skull: No fracture or aggressive osseous lesion. Sinuses/Orbits: No mass or acute finding within the imaged orbits. Small volume frothy secretions within the left sphenoid sinus. Other: Small left forehead hematoma. Subcutaneous right forehead lipoma measuring 1.6 x 0.4 cm (series 2, image 18). ASPECTS ([place] Stroke Program Early CT Score) - Ganglionic level infarction (caudate, lentiform nuclei, internal capsule, insula, M1-M3 cortex): 7 - Supraganglionic infarction (M4-M6 cortex): 3 Total score (0-10 with 10 being normal): 10 Impressions #1 and #2 called by telephone at the time of interpretation on 06/20/2022 at 11:40 am to provider Davonna Belling , who verbally acknowledged these results. IMPRESSION: 1. No evidence of acute intracranial hemorrhage or acute infarct. 2. Small calcified focus at the anterior aspect of the right sylvian fissure, new from the prior head CT of 08/20/2009 and possibly reflecting calcified embolic plaque. 3. Mild chronic small vessel ischemic changes within the cerebral white matter. 4. Redemonstrated small chronic lacunar infarct within the right caudate nucleus, and possible small chronic lacunar infarct within the left lentiform nucleus. 5. Moderate cerebral atrophy. 6. Small left forehead hematoma. 7. 1.6 cm subcutaneous right forehead lipoma. 8. Left sphenoid sinusitis. Electronically Signed   By: Kellie Simmering D.O.   On: 06/20/2022 11:42    Procedures Procedures    Medications Ordered in ED Medications - No data to display  ED Course/ Medical Decision Making/ A&P  Patient is a 88 year old male presenting with complaints of mental status change/confusion as described in the HPI.  Patient was seen here  yesterday with similar complaints, but ultimately discharged from the ER after negative work-up.  He returns with similar issues.  According to the daughter at bedside, he has never behaved like this in the past.  Patient arrives here with stable vital signs and is afebrile.  He appears neurologically intact, but is somewhat confused and provides only very brief responses to questions.  Work-up initiated including CBC, metabolic panel, and CT scan of the head.  All of these are essentially unremarkable and unchanged from 2 days ago.  At this point, the daughter is uncomfortable with the patient returning home.  She is concerned there may be something more that is wrong with him.  Have discussed care with the hospitalist who agrees to admit.  Final Clinical Impression(s) / ED Diagnoses Final diagnoses:  None    Rx / DC Orders ED Discharge Orders     None         Veryl Speak, MD 06/22/22 [phone]

## 2022-06-22 NOTE — TOC Initial Note (Signed)
Transition of Care [facility]) - Initial/Assessment Note    Patient Details  Name: Jordan [name] MRN: [id number] Date of Birth: [birth year]  Transition of Care [facility]) CM/SW Contact:    Iona Beard, [place] Phone Number: 06/22/2022, 11:37 AM  Clinical Narrative:                 CSW spoke with pts daughter as pt was set up with [facility] services yesterday. Pts daughter provides care in the home. Pt was set up with [facility] yesterday 12/6. Pt has a walker to use when ambulating. TOC to follow.   Expected Discharge Plan: [place] Barriers to Discharge: Continued Medical Work up   Patient Goals and CMS Choice Patient states their goals for this hospitalization and ongoing recovery are:: get better CMS Medicare.gov Compare Post Acute Care list provided to:: Patient Represenative (must comment) Choice offered to / list presented to : Patient, Adult Children  Expected Discharge Plan and Services Expected Discharge Plan: [place] In-house Referral: Clinical Social Work Discharge Planning Services: CM Consult Post Acute Care Choice: [place] arrangements for the past 2 months: Single Family Home                                      Prior Living Arrangements/Services Living arrangements for the past 2 months: Single Family Home Lives with:: Adult Children Patient language and need for interpreter reviewed:: Yes Do you feel safe going back to the place where you live?: Yes      Need for Family Participation in Patient Care: Yes (Comment) Care giver support system in place?: Yes (comment) Current home services: DME Criminal Activity/Legal Involvement Pertinent to Current Situation/Hospitalization: No - Comment as needed  Activities of Daily Living      Permission Sought/Granted                  Emotional Assessment Appearance:: Appears stated age Attitude/Demeanor/Rapport: Engaged Affect (typically observed):  Accepting   Alcohol / Substance Use: Not Applicable Psych Involvement: No (comment)  Admission diagnosis:  Acute metabolic encephalopathy [Q76.22] Patient Active Problem List   Diagnosis Date Noted   Acute metabolic encephalopathy 63/33/5456   Confusion with nonfocal neurological examination 06/20/2022   Dementia with behavioral disturbance ([place]) 06/20/2022   Lymphocytic colitis 10/13/2019   Benign prostatic hyperplasia with urinary obstruction 08/20/2019   Nocturia 08/20/2019   Pancreatitis 06/13/2016   Type 2 diabetes mellitus with complication (HCC)    S/P drug eluting coronary stent placement    Hypertension    Hyperlipidemia    History of colonic polyps    Diverticulosis of colon without hemorrhage    Hemorrhoids 10/23/2014   Constipation 06/03/2012   Balance disorder/Fall at home 05/29/2012   PCP:  Asencion Noble, MD Pharmacy:   [facility] [place] [address] Phone: [phone] Fax: [phone]     Social Determinants of Health (SDOH) Interventions    Readmission Risk Interventions     No data to display

## 2022-06-23 ENCOUNTER — Observation Stay (HOSPITAL_COMMUNITY)
Admit: 2022-06-23 | Discharge: 2022-06-23 | Disposition: A | Discharge status: Home / Self Care | Payer: Medicare Other | Attending: Internal Medicine | Admitting: Internal Medicine

## 2022-06-23 DIAGNOSIS — R29810 Facial weakness: Secondary | ICD-10-CM | POA: Diagnosis present

## 2022-06-23 DIAGNOSIS — I7143 Infrarenal abdominal aortic aneurysm, without rupture: Secondary | ICD-10-CM | POA: Diagnosis present

## 2022-06-23 DIAGNOSIS — Z681 Body mass index (BMI) 19 or less, adult: Secondary | ICD-10-CM | POA: Diagnosis not present

## 2022-06-23 DIAGNOSIS — G9340 Encephalopathy, unspecified: Secondary | ICD-10-CM | POA: Diagnosis not present

## 2022-06-23 DIAGNOSIS — Z515 Encounter for palliative care: Secondary | ICD-10-CM | POA: Diagnosis not present

## 2022-06-23 DIAGNOSIS — N400 Enlarged prostate without lower urinary tract symptoms: Secondary | ICD-10-CM | POA: Diagnosis present

## 2022-06-23 DIAGNOSIS — C3490 Malignant neoplasm of unspecified part of unspecified bronchus or lung: Secondary | ICD-10-CM | POA: Diagnosis present

## 2022-06-23 DIAGNOSIS — G9341 Metabolic encephalopathy: Secondary | ICD-10-CM | POA: Diagnosis not present

## 2022-06-23 DIAGNOSIS — E118 Type 2 diabetes mellitus with unspecified complications: Secondary | ICD-10-CM | POA: Diagnosis not present

## 2022-06-23 DIAGNOSIS — I2510 Atherosclerotic heart disease of native coronary artery without angina pectoris: Secondary | ICD-10-CM | POA: Diagnosis present

## 2022-06-23 DIAGNOSIS — I10 Essential (primary) hypertension: Secondary | ICD-10-CM | POA: Diagnosis present

## 2022-06-23 DIAGNOSIS — K52832 Lymphocytic colitis: Secondary | ICD-10-CM | POA: Diagnosis present

## 2022-06-23 DIAGNOSIS — I350 Nonrheumatic aortic (valve) stenosis: Secondary | ICD-10-CM | POA: Diagnosis present

## 2022-06-23 DIAGNOSIS — R627 Adult failure to thrive: Secondary | ICD-10-CM | POA: Diagnosis present

## 2022-06-23 DIAGNOSIS — R918 Other nonspecific abnormal finding of lung field: Secondary | ICD-10-CM | POA: Diagnosis not present

## 2022-06-23 DIAGNOSIS — Z8249 Family history of ischemic heart disease and other diseases of the circulatory system: Secondary | ICD-10-CM | POA: Diagnosis not present

## 2022-06-23 DIAGNOSIS — I4510 Unspecified right bundle-branch block: Secondary | ICD-10-CM | POA: Diagnosis present

## 2022-06-23 DIAGNOSIS — E119 Type 2 diabetes mellitus without complications: Secondary | ICD-10-CM | POA: Diagnosis present

## 2022-06-23 DIAGNOSIS — E785 Hyperlipidemia, unspecified: Secondary | ICD-10-CM | POA: Diagnosis present

## 2022-06-23 DIAGNOSIS — Z7982 Long term (current) use of aspirin: Secondary | ICD-10-CM | POA: Diagnosis not present

## 2022-06-23 DIAGNOSIS — Z87891 Personal history of nicotine dependence: Secondary | ICD-10-CM | POA: Diagnosis not present

## 2022-06-23 DIAGNOSIS — Z79899 Other long term (current) drug therapy: Secondary | ICD-10-CM | POA: Diagnosis not present

## 2022-06-23 DIAGNOSIS — Z66 Do not resuscitate: Secondary | ICD-10-CM | POA: Diagnosis present

## 2022-06-23 DIAGNOSIS — R4182 Altered mental status, unspecified: Secondary | ICD-10-CM | POA: Diagnosis present

## 2022-06-23 DIAGNOSIS — M1990 Unspecified osteoarthritis, unspecified site: Secondary | ICD-10-CM | POA: Diagnosis present

## 2022-06-23 DIAGNOSIS — R636 Underweight: Secondary | ICD-10-CM | POA: Diagnosis present

## 2022-06-23 DIAGNOSIS — Z955 Presence of coronary angioplasty implant and graft: Secondary | ICD-10-CM | POA: Diagnosis not present

## 2022-06-23 DIAGNOSIS — Z7984 Long term (current) use of oral hypoglycemic drugs: Secondary | ICD-10-CM | POA: Diagnosis not present

## 2022-06-23 DIAGNOSIS — R531 Weakness: Secondary | ICD-10-CM | POA: Diagnosis not present

## 2022-06-23 DIAGNOSIS — F03918 Unspecified dementia, unspecified severity, with other behavioral disturbance: Secondary | ICD-10-CM | POA: Diagnosis present

## 2022-06-23 LAB — BLOOD CULTURE ID PANEL (REFLEXED) - BCID2

## 2022-06-23 LAB — CK: Total CK: 164 U/L (ref 49–397)

## 2022-06-23 LAB — COMPREHENSIVE METABOLIC PANEL
ALT: 20 U/L (ref 0–44)
AST: 27 U/L (ref 15–41)
Albumin: 3.1 g/dL — ABNORMAL LOW (ref 3.5–5.0)
Alkaline Phosphatase: 97 U/L (ref 38–126)
Anion gap: 9 (ref 5–15)
BUN: 27 mg/dL — ABNORMAL HIGH (ref 8–23)
CO2: 25 mmol/L (ref 22–32)
Calcium: 8.8 mg/dL — ABNORMAL LOW (ref 8.9–10.3)
Chloride: 110 mmol/L (ref 98–111)
Creatinine, Ser: 1.1 mg/dL (ref 0.61–1.24)
GFR, Estimated: >60 mL/min (ref >60)
Glucose, Bld: 141 mg/dL — ABNORMAL HIGH (ref 70–99)
Potassium: 5.3 mmol/L — ABNORMAL HIGH (ref 3.5–5.1)
Sodium: 144 mmol/L (ref 135–145)
Total Bilirubin: 0.4 mg/dL (ref 0.3–1.2)
Total Protein: 6.1 g/dL — ABNORMAL LOW (ref 6.5–8.1)

## 2022-06-23 LAB — CBC
HCT: 35.8 % — ABNORMAL LOW (ref 39.0–52.0)
Hemoglobin: 11.2 g/dL — ABNORMAL LOW (ref 13.0–17.0)
MCH: 29.2 pg (ref 26.0–34.0)
MCHC: 31.3 g/dL (ref 30.0–36.0)
MCV: 93.2 fL (ref 80.0–100.0)
Platelets: 346 10*3/uL (ref 150–400)
RBC: 3.84 MIL/uL — ABNORMAL LOW (ref 4.22–5.81)
RDW: 13.4 % (ref 11.5–15.5)
WBC: 6.9 10*3/uL (ref 4.0–10.5)
nRBC: 0 % (ref 0.0–0.2)

## 2022-06-23 LAB — GLUCOSE, CAPILLARY
Glucose-Capillary: 149 mg/dL — ABNORMAL HIGH (ref 70–99)
Glucose-Capillary: 135 mg/dL — ABNORMAL HIGH (ref 70–99)
Glucose-Capillary: 98 mg/dL (ref 70–99)
Glucose-Capillary: 148 mg/dL — ABNORMAL HIGH (ref 70–99)

## 2022-06-23 LAB — VITAMIN B12: Vitamin B-12: 398 pg/mL (ref 180–914)

## 2022-06-23 LAB — MRSA NEXT GEN BY PCR, NASAL: MRSA by PCR Next Gen: NOT DETECTED

## 2022-06-23 MED ORDER — ORAL CARE MOUTH RINSE: 15.0000 mL | OROMUCOSAL | Status: DC | PRN

## 2022-06-23 MED ORDER — METOPROLOL TARTRATE 5 MG/5ML IV SOLN
5.0000 mg | Freq: Three times a day (TID) | INTRAVENOUS | Status: DC
  Administered 2022-06-23 – 2022-07-03 (×23): 5 mg via INTRAVENOUS
  Filled 2022-06-23 (×26): qty 5

## 2022-06-23 MED ORDER — SODIUM CHLORIDE 0.9 % IV SOLN
2.0000 g | INTRAVENOUS | Status: DC
  Administered 2022-06-24: 2 g via INTRAVENOUS
  Filled 2022-06-23: qty 20

## 2022-06-23 MED ORDER — VANCOMYCIN HCL 1250 MG/250ML IV SOLN: 1250.0000 mg | INTRAVENOUS | Status: DC

## 2022-06-23 MED ORDER — VANCOMYCIN HCL 750 MG/150ML IV SOLN: 750.0000 mg | INTRAVENOUS | Status: DC

## 2022-06-23 MED ORDER — VANCOMYCIN HCL 750 MG/150ML IV SOLN: 750.0000 mg | Freq: Once | INTRAVENOUS | Status: DC

## 2022-06-23 MED ORDER — SODIUM CHLORIDE 0.9 % IV SOLN: INTRAVENOUS | Status: AC

## 2022-06-23 MED ORDER — THIAMINE HCL 100 MG/ML IJ SOLN
500.0000 mg | Freq: Every day | INTRAVENOUS | Status: DC
  Administered 2022-06-23 – 2022-06-24 (×2): 500 mg via INTRAVENOUS
  Filled 2022-06-23 (×4): qty 5

## 2022-06-23 MED ORDER — HALOPERIDOL LACTATE 5 MG/ML IJ SOLN
1.0000 mg | Freq: Four times a day (QID) | INTRAMUSCULAR | Status: DC | PRN
  Administered 2022-06-23 – 2022-06-24 (×3): 1 mg via INTRAVENOUS
  Filled 2022-06-23: qty 0.2
  Filled 2022-06-23 (×3): qty 1

## 2022-06-23 MED ORDER — VANCOMYCIN HCL 1250 MG/250ML IV SOLN: 1250.0000 mg | Freq: Once | INTRAVENOUS | Status: DC

## 2022-06-23 MED ORDER — QUETIAPINE FUMARATE 25 MG PO TABS
25.0000 mg | ORAL_TABLET | Freq: Two times a day (BID) | ORAL | Status: DC
  Administered 2022-06-23 – 2022-06-25 (×4): 25 mg via ORAL
  Filled 2022-06-23 (×7): qty 1

## 2022-06-23 MED ORDER — LABETALOL HCL 5 MG/ML IV SOLN: 5.0000 mg | INTRAVENOUS | Status: DC | PRN

## 2022-06-23 MED ORDER — VANCOMYCIN HCL 1250 MG/250ML IV SOLN
1250.0000 mg | Freq: Once | INTRAVENOUS | Status: AC
  Administered 2022-06-23: 1250 mg via INTRAVENOUS
  Filled 2022-06-23: qty 250

## 2022-06-23 NOTE — Progress Notes (Signed)
EEG complete - results pending 

## 2022-06-23 NOTE — Progress Notes (Signed)
PROGRESS NOTE    Jordan [name]  ZOX:[id number] DOB: 28-Jun-1923 DOA: 06/21/2022 PCP: Asencion Noble, MD     Brief Narrative:   Briefly history of memory issue for several years, h/o aricept intolerance, now on Namenda, baseline lives at home perform ADLs independently, history of HTN, HLD,, CAD status post DES stent, NIDDM2 sent to the hospital the second time in two days due to behavioral disturbance.It is reported that he has been combative and agitated for 3 days. He was brought to the ER on the fifth,     12/5: ct head showed  small chronic lacunar infarct within the right caudate nucleus, and possible small chronic lacunar infarct within the left lentiform nucleus. Moderate cerebral atrophy.  Small left forehead hematoma. Mri brain no acute findings,  12/5 CT angio headneck1. No evidence of large vessel occlusion. 2. Severe focal stenoses in the distal P1 and proximal P2 segments of the left PCA. 3. Approximately 75% stenosis at the origin of the right ICA. Peripheral  4. subsolid pulmonary opacity has increased in size compared to 2017 and is suspicious for malignancy. Recommend further evaluation with a dedicated chest CT. 12/5 cxr no acute findings   He with  was discharged home on 12/5  but brought back due to the same complaints, family request placement.   Ua no sign of infection WBC 6.7, hemoglobin 10.3 Potassium 5.7, repeat 4.2 Glucose 167 BUN 33, creatinine 1.27, AST 44 Urine is clear in canister, bladder scan is 200  Subjective:  Confused and intermittent combative, ordered safety sitter Continue hydration, start seroquel add prn haldol, labetalol Repeat bmp  Assessment & Plan:  Principal Problem:   Acute metabolic encephalopathy Active Problems:   Dementia with behavioral disturbance (HCC)   Type 2 diabetes mellitus with complication (HCC)   Hypertension   Hyperlipidemia    Assessment and Plan:   * Acute metabolic encephalopathy vs progressive  of dementia - CT head this admission shows no acute intracranial abnormality - MRI brain on December 5 shows no acute intracranial process.  No evidence of acute or subacute infarct. -uds negative --will need to rule out secondary causes, blood culture 2/4 + GPC, empiric start on antibiotics, repeat blood culture -check EEG, check RPR, ammonia, tsh -As needed Haldol ordered , or Seroquel ,   avoid Ativan ., ordered safety sitter, family is encouraged to stay in the Evening/night if able    Hyperlipidemia - Continue statin  Hypertension - Continue Vasotec  NIDDM2 -controlled, A1c 7.2 - Hold home meds metformin - Sliding scale coverage    Dementia with behavioral disturbance (HCC) - Apparently patient did not tolerate Aricept, has been on Namenda for several years  FTT: Daughter reports patient has macular degeneration, legally blind, started to loose weight about a year ago, becomeweaknes about two months ago when he started to use a walker, he does has short term memory issues but has been very social and going to  church once a month, last a month ago, he fell on thanksgiving day , that was his only fall. Daughter reports he is usually with a sound mind and able to discuss stock market with her but he work up confused on 12/5 this is a sudden change.      Body mass index is 18.98 kg/m..     I have Reviewed nursing notes, Vitals, pain scores, I/o's, Lab results and  imaging results since pt's last encounter, details please see discussion above  I ordered the following labs:  Unresulted Labs (From admission, onward)     Start     Ordered   06/24/22 0500  RPR  Tomorrow morning,   R        06/23/22 1711   06/24/22 0500  HIV Antibody (routine testing w rflx)  (HIV Antibody (Routine testing w reflex) panel)  Tomorrow morning,   R        06/23/22 1711   06/24/22 0500  Ammonia  Tomorrow morning,   R        06/23/22 1711   06/24/22 0500  CBC with Differential/Platelet  Tomorrow  morning,   R        06/23/22 1719   06/24/22 3295  Basic metabolic panel  Daily,   R      06/23/22 1719   06/23/22 1704  Culture, blood (Routine X 2) w Reflex to ID Panel  BLOOD CULTURE X 2,   R      06/23/22 1703   06/23/22 1236  MRSA Next Gen by PCR, Nasal  Once,   R        06/23/22 1235             DVT prophylaxis: heparin injection 5,000 Units Start: 06/22/22 0600 SCDs Start: 06/22/22 0509   Code Status:   Code Status: DNR  Family Communication: Daughter over the phone daily Disposition:   Status is: Observation  Dispo: The patient is from: home, lives with daughter               Anticipated d/c is to: TBD              Anticipated d/c date is: TBD  Antimicrobials:   Anti-infectives (From admission, onward)    Start     Dose/Rate Route Frequency Ordered Stop   06/24/22 1345  vancomycin (VANCOREADY) IVPB 1250 mg/250 mL  Status:  Discontinued       See Hyperspace for full Linked Orders Report.   1,250 mg 166.7 mL/hr over 90 Minutes Intravenous Every 24 hours 06/23/22 1248 06/23/22 1250   06/24/22 1345  vancomycin (VANCOREADY) IVPB 1250 mg/250 mL  Status:  Discontinued       See Hyperspace for full Linked Orders Report.   1,250 mg 166.7 mL/hr over 90 Minutes Intravenous Every 24 hours 06/23/22 1250 06/23/22 1251   06/24/22 1345  vancomycin (VANCOREADY) IVPB 750 mg/150 mL  Status:  Discontinued       See Hyperspace for full Linked Orders Report.   750 mg 150 mL/hr over 60 Minutes Intravenous Every 24 hours 06/23/22 1251 06/23/22 1653   06/24/22 1200  cefTRIAXone (ROCEPHIN) 2 g in sodium chloride 0.9 % 100 mL IVPB        2 g 200 mL/hr over 30 Minutes Intravenous Every 24 hours 06/23/22 1653     06/23/22 1345  vancomycin (VANCOREADY) IVPB 1250 mg/250 mL  Status:  Discontinued       See Hyperspace for full Linked Orders Report.   1,250 mg 166.7 mL/hr over 90 Minutes Intravenous  Once 06/23/22 1248 06/23/22 1250   06/23/22 1345  vancomycin (VANCOREADY) IVPB 750 mg/150  mL  Status:  Discontinued       See Hyperspace for full Linked Orders Report.   750 mg 150 mL/hr over 60 Minutes Intravenous  Once 06/23/22 1250 06/23/22 1251   06/23/22 1345  vancomycin (VANCOREADY) IVPB 1250 mg/250 mL       See Hyperspace for full Linked Orders Report.   1,250 mg 166.7 mL/hr over  90 Minutes Intravenous  Once 06/23/22 1251 06/23/22 1542           Objective: Vitals:   06/23/22 0324 06/23/22 0500 06/23/22 1349 06/23/22 1500  BP: (!) 178/80  (!) 165/74 (!) 174/71  Pulse: (!) 102  (!) 105 100  Resp: 19  20   Temp: 98.2 F (36.8 C)  98 F (36.7 C)   TempSrc:      SpO2:   96%   Weight:  58.3 kg    Height:        Intake/Output Summary (Last 24 hours) at 06/23/2022 1719 Last data filed at 06/23/2022 1500 Gross per 24 hour  Intake 681.12 ml  Output 800 ml  Net -118.88 ml   Filed Weights   06/22/22 0002 06/23/22 0500  Weight: 60 kg 58.3 kg    Examination:  General exam: awake, very confused, intermittent combative  Respiratory system: Clear to auscultation. Respiratory effort normal. Cardiovascular system:  RRR.  Gastrointestinal system: Abdomen is nondistended, soft and nontender.  Normal bowel sounds heard. Central nervous system: Alert and confused. No focal neurological deficits. Extremities:  no edema Skin: Diffuse ecchymosis Psychiatry: Confused    Data Reviewed: I have personally reviewed  labs and visualized  imaging studies since the last encounter and formulate the plan        Scheduled Meds:  aspirin EC  81 mg Oral Q breakfast   budesonide  3 mg Oral Daily   enalapril  10 mg Oral Daily   finasteride  5 mg Oral Daily   heparin  5,000 Units Subcutaneous Q8H   insulin aspart  0-15 Units Subcutaneous TID WC   memantine  10 mg Oral BID   metoprolol tartrate  5 mg Intravenous Q8H   mirabegron ER  25 mg Oral Daily   pravastatin  80 mg Oral Daily   QUEtiapine  25 mg Oral BID   tamsulosin  0.4 mg Oral QPC supper   Continuous  Infusions:  sodium chloride 75 mL/hr at 06/23/22 0810   [START ON 06/24/2022] cefTRIAXone (ROCEPHIN)  IV     thiamine (VITAMIN B1) injection       LOS: 0 days     Florencia Reasons, MD PhD FACP [facility]  Available via Epic secure chat 7am-7pm for nonurgent issues Please page for urgent issues To page the attending provider between 7A-7P or the covering provider during after hours 7P-7A, please log into the web site [email or web address] and access using universal [place] password for that web site. If you do not have the password, please call the hospital operator.    06/23/2022, 5:19 PM

## 2022-06-23 NOTE — Progress Notes (Signed)
Patient attempting to punch staff, actively kicking at staff, unable to give any PO medications due to agitation, Notified Dr. Annamaria Boots . Patient beating on the siderails of bed skin tear noted he will not allow staff to access at this time.

## 2022-06-23 NOTE — Evaluation (Addendum)
Clinical/Bedside Swallow Evaluation Patient Details  Name: Jordan [name] MRN: [id number] Date of Birth: March 11, 1923  Today's Date: 06/23/2022 Time: SLP Start Time (ACUTE ONLY): 1200 SLP Stop Time (ACUTE ONLY): 2993 SLP Time Calculation (min) (ACUTE ONLY): 36 min  Past Medical History:  Past Medical History:  Diagnosis Date   Aortic stenosis    Arthritis    BPH (benign prostatic hypertrophy)    CAD (coronary artery disease)    DES to RCA 2009   Diverticulosis of colon    Essential hypertension    Fecal urgency    History of adenomatous polyp of colon    Tubular adenomas 2016   Hyperlipidemia    Lower urinary tract symptoms (LUTS)    Type 2 diabetes mellitus ([place])    Wears glasses    Past Surgical History:  Past Surgical History:  Procedure Laterality Date   BIOPSY  10/02/2019   Procedure: BIOPSY;  Surgeon: Rogene Houston, MD;  Location: [facility];  Service: Endoscopy;;   CATARACT EXTRACTION W/ INTRAOCULAR LENS  IMPLANT, BILATERAL     COLONOSCOPY  last one 11/16/2014   polpectomy   CORONARY ANGIOPLASTY WITH STENT PLACEMENT  12-23-2007  dr hochrein   (abnormal myoview)  PCI w/ DES x1 pRCA (95%),  pCFX 25%,  mCFX 40-50%,  after PDA 30%,  prox, mid, and ostial LAD  30% calcification,  LM diffuse 25% calcification,  ef 60%   EVALUATION UNDER ANESTHESIA WITH ANAL FISTULECTOMY [name] 06/03/2015   Procedure: ANAL EXAM UNDER ANESTHESIA ,FISTULOTOMY ;  Surgeon: [name], MD;  Location: [facility];  Service: General;  Laterality: [name];   FLEXIBLE SIGMOIDOSCOPY [name] 10/02/2019   Procedure: FLEXIBLE SIGMOIDOSCOPY;  Surgeon: Rogene Houston, MD;  Location: [facility];  Service: Endoscopy;  Laterality: [name];  230   INGUINAL HERNIA REPAIR Right 2001   TRANSTHORACIC ECHOCARDIOGRAM  12-06-2007   normal LVF, ef 60%/  mild to moderate  AV calcification without stenosis,  mild AR and TR,  mild LAE   HPI:  88 y.o. male  with past medical history of mild cognitive  dysfunction with short-term memory loss, aortic stenosis not a candidate for TAVR, BPH, coronary artery disease, hypertension, DM 2, diastolic dysfunction, bilateral hearing loss, macular degeneration, admitted on 06/21/2022 with altered mental status.  He has been agitated and combative for several days.  CT scan and MRI did not show any acute findings although there is advanced cerebral atrophy.  There was evidence of small chronic lacunar infarcts, and small left forehead hematoma.  CT angio of the head and neck showed 75% stenosis of his right ICA as well as a peripheral subsolid pulmonary opacity that has increased in size compared to 2017 and is suspicious for malignancy. No acute findings on Chest X-ray. BSE requested.    Assessment / Plan / Recommendation  Clinical Impression  Clinical swallowing evaluation completed while Pt was sitting upright in bed; Pt presents with primary cognitive based dysphagia. Pt is confused and somewhat agitated asking SLP to "please call the police".When repositioned, Pt consumed thin liquids via straw. With initial sip note immediate coughing but with verbal cues and increased awareness as evaluation continued there was no further coghing with thin liquid trials. Pt consumed mech soft and puree trials with prolonged mastication but no overt s/sx of aspiration,. recommend continue with current diet (regular/thin) with strategies: ensure Pt is sitting upright for PO, ensure Pt is aware and prepared for food/liquid going into his mouth. Recommend crush  meds. If Pt is highly agitated or not alert, please do not feed patient. ST will f/u X1 to ensure diet tolerance SLP Visit Diagnosis: Dysphagia, unspecified (R13.10)    Aspiration Risk  Mild aspiration risk;Risk for inadequate nutrition/hydration    Diet Recommendation Dysphagia 3 (Mech soft);Thin liquid   Liquid Administration via: Cup;Straw Medication Administration: Whole meds with liquid Supervision: Patient able to  self feed;Staff to assist with self feeding;Full supervision/cueing for compensatory strategies Postural Changes: Seated upright at 90 degrees    Other  Recommendations Oral Care Recommendations: Oral care BID    Recommendations for follow up therapy are one component of a multi-disciplinary discharge planning process, led by the attending physician.  Recommendations may be updated based on patient status, additional functional criteria and insurance authorization.  Follow up Recommendations No SLP follow up            Frequency and Duration min 1 x/week  1 week       Prognosis Prognosis for Safe Diet Advancement: Fair Barriers to Reach Goals: Cognitive deficits      Swallow Study   General Date of Onset: 06/21/22 HPI: 88 y.o. male  with past medical history of mild cognitive dysfunction with short-term memory loss, aortic stenosis not a candidate for TAVR, BPH, coronary artery disease, hypertension, DM 2, diastolic dysfunction, bilateral hearing loss, macular degeneration, admitted on 06/21/2022 with altered mental status.  He has been agitated and combative for several days.  CT scan and MRI did not show any acute findings although there is advanced cerebral atrophy.  There was evidence of small chronic lacunar infarcts, and small left forehead hematoma.  CT angio of the head and neck showed 75% stenosis of his right ICA as well as a peripheral subsolid pulmonary opacity that has increased in size compared to 2017 and is suspicious for malignancy. No acute findings on Chest X-ray. BSE requested. Type of Study: Bedside Swallow Evaluation Previous Swallow Assessment: none in chart Diet Prior to this Study: Dysphagia 3 (soft);Thin liquids Temperature Spikes Noted: No Respiratory Status: Room air History of Recent Intubation: No Behavior/Cognition: Alert;Confused;Agitated Oral Cavity Assessment: Within Functional Limits;Dry Oral Care Completed by SLP: Recent completion by staff Oral  Cavity - Dentition: Adequate natural dentition Self-Feeding Abilities: Needs assist;Needs set up;Total assist Patient Positioning: Upright in bed Baseline Vocal Quality: Normal Volitional Cough: Strong Volitional Swallow: Able to elicit    Oral/Motor/Sensory Function Overall Oral Motor/Sensory Function: Within functional limits   Ice Chips Ice chips: Within functional limits   Thin Liquid Thin Liquid: Within functional limits    Nectar Thick Nectar Thick Liquid: Not tested   Honey Thick Honey Thick Liquid: Not tested   Puree Puree: Within functional limits   Solid     Solid: Within functional limits     Wadie Mattie H. Roddie Mc, [place] Speech Language Pathologist  Wende Bushy 06/23/2022,12:36 PM

## 2022-06-23 NOTE — Progress Notes (Addendum)
Pharmacy Antibiotic Note  Jordan [name] is a 88 y.o. male admitted on 06/21/2022 with bacteremia.  Pharmacy has been consulted for Vancomcycin dosing. + BCX bottle for GPC Plan: Vancomycin 1250mg  IV loading dose then 750mg   mg IV Q 24 hrs. Goal AUC 400-550. Expected AUC: 508 SCr used: 1.1  F/U cxs and clinical progress Monitor V/S, labs and levels as indicate  Height: 5\' 9"  (175.3 cm) Weight: 58.3 kg (128 lb 8.5 oz) IBW/kg (Calculated) : 70.7  Temp (24hrs), Avg:98.2 F (36.8 C), Min:98 F (36.7 C), Max:98.5 F (36.9 C)  Recent Labs  Lab 06/20/22 1108 06/20/22 1125 06/22/22 0008 06/22/22 0521 06/22/22 0820 06/23/22 0457  WBC 6.5  --  6.2 6.7  --  6.9  CREATININE 1.09 1.30* 1.27* 1.17 1.06 1.10    Estimated Creatinine Clearance: 31.7 mL/min (by C-G formula based on SCr of 1.1 mg/dL).    No Known Allergies  Antimicrobials this admission: Vancomycin 12/8 >>   Microbiology results: 12/7 BCx: GPC in aerobic and anaerobic bottles  Thank you for allowing pharmacy to be a part of this patient's care.  Isac Sarna, BS Pharm D, BCPS Clinical Pharmacist 06/23/2022 12:43 PM

## 2022-06-23 NOTE — Procedures (Signed)
[facility]  Routine EEG Report   Demographics: Patient Name:   Jordan, [name]  Date of Birth:   Dec 19, 1922  Identification Number:   MRN - [id number]  Study Times:  Study Start Time:   06/23/2022 14:19:56 Study End Time:   06/23/2022 14:43:35  Duration:   23 minutes  Indication(s): Encephalopathy  Technical Summary:  This EEG was performed utilizing standard International 10-20 System of electrode placement. Data were obtained and interpreted utilizing referential montage recording, with reformatting to longitudinal, transverse bipolar, and referential montages as necessary for interpretation.  State(s):       Awake   Activation Procedures: Hyperventilation: Not performed Photic Stimulation: Not performed   EEG Description:  No normal patterns of awake or sleep were present during this recording. Intermittent generalized theta-delta slowing was present. Excess continuous generalized theta-delta slowing was present. Minor muscle, motion, electrode, and eye movement artifacts were occasionally noted.  Impression:  Abnormal EEG due to:    1) No normal patterns of awake or sleep  2) Intermittent generalized theta-delta slowing  3) Continuous generalized theta-delta slowing     Clinical Correlation:  This EEG is suggestive of a moderate encephalopathy, but is nonspecific as to etiology. The absence of epileptiform abnormalities does not preclude a clinical diagnosis of seizures.    [facility] For Inpatient follow-up with [facility] physician please call [facility] [phone]. This is not an outpatient service. Post hospital discharge, please contact hospital directly.  Please do not communicate with [facility] physicians via secure chat. If you have any questions, Please contact [facility].

## 2022-06-23 NOTE — Progress Notes (Signed)
Pharmacy Antibiotic Note  Jordan [name] is a 88 y.o. male admitted on 06/21/2022 with bacteremia.    + BCX bottle for GPC=> identified as staph species and no resistance. Reviewed with MD and will change to ceftriaxone  Plan: D/C Vancomycin Ceftriaxone 2gm IV q24h  (starting tomorrow, vancomycin given today) F/U cxs and clinical progress Monitor V/S, labs   Height: 5\' 9"  (175.3 cm) Weight: 58.3 kg (128 lb 8.5 oz) IBW/kg (Calculated) : 70.7  Temp (24hrs), Avg:98.1 F (36.7 C), Min:98 F (36.7 C), Max:98.2 F (36.8 C)  Recent Labs  Lab 06/20/22 1108 06/20/22 1125 06/22/22 0008 06/22/22 0521 06/22/22 0820 06/23/22 0457  WBC 6.5  --  6.2 6.7  --  6.9  CREATININE 1.09 1.30* 1.27* 1.17 1.06 1.10     Estimated Creatinine Clearance: 31.7 mL/min (by C-G formula based on SCr of 1.1 mg/dL).    No Known Allergies  Antimicrobials this admission: Vancomycin 12/8 >> 12/8 Ceftriaxone 12/9>>  Microbiology results: 12/7 BCx: BCID show staph species, no resistance   Thank you for allowing pharmacy to be a part of this patient's care.  Isac Sarna, BS Pharm D, BCPS Clinical Pharmacist 06/23/2022 4:49 PM

## 2022-06-23 NOTE — Progress Notes (Signed)
Pt threw legs over railing several times throughtout night. Re-directed and assisted back over in bed. 1cm X 1cm skin tear noted to back of left lower leg, cleansed and applied bandage. Medicated for pain as pt confused and unable to rate, BP was elevated, pt calmer after pain medication. Will continue to monitor.

## 2022-06-23 NOTE — Progress Notes (Signed)
Patients Blood culture showed Gram positive Cocci on both aerobic and anaerobic cultures. Notified Dr. Annamaria Boots .

## 2022-06-23 NOTE — Progress Notes (Signed)
Patient has large Skin tear to RFA , covered with mepilex .

## 2022-06-24 DIAGNOSIS — G9341 Metabolic encephalopathy: Secondary | ICD-10-CM | POA: Diagnosis not present

## 2022-06-24 LAB — GLUCOSE, CAPILLARY
Glucose-Capillary: 89 mg/dL (ref 70–99)
Glucose-Capillary: 88 mg/dL (ref 70–99)
Glucose-Capillary: 109 mg/dL — ABNORMAL HIGH (ref 70–99)
Glucose-Capillary: 135 mg/dL — ABNORMAL HIGH (ref 70–99)

## 2022-06-24 LAB — CBC WITH DIFFERENTIAL/PLATELET
Abs Immature Granulocytes: 0 10*3/uL (ref 0.00–0.07)
Basophils Absolute: 0 10*3/uL (ref 0.0–0.1)
Basophils Relative: 1 %
Eosinophils Absolute: 0.1 10*3/uL (ref 0.0–0.5)
Eosinophils Relative: 2 %
HCT: 31.4 % — ABNORMAL LOW (ref 39.0–52.0)
Hemoglobin: 10 g/dL — ABNORMAL LOW (ref 13.0–17.0)
Immature Granulocytes: 0 %
Lymphocytes Relative: 18 %
Lymphs Abs: 0.7 10*3/uL (ref 0.7–4.0)
MCH: 29.2 pg (ref 26.0–34.0)
MCHC: 31.8 g/dL (ref 30.0–36.0)
MCV: 91.5 fL (ref 80.0–100.0)
Monocytes Absolute: 0.6 10*3/uL (ref 0.1–1.0)
Monocytes Relative: 15 %
Neutro Abs: 2.5 10*3/uL (ref 1.7–7.7)
Neutrophils Relative %: 64 %
Platelets: 289 10*3/uL (ref 150–400)
RBC: 3.43 MIL/uL — ABNORMAL LOW (ref 4.22–5.81)
RDW: 13.6 % (ref 11.5–15.5)
WBC: 3.9 10*3/uL — ABNORMAL LOW (ref 4.0–10.5)
nRBC: 0 % (ref 0.0–0.2)

## 2022-06-24 LAB — BASIC METABOLIC PANEL
Anion gap: 9 (ref 5–15)
BUN: 27 mg/dL — ABNORMAL HIGH (ref 8–23)
CO2: 22 mmol/L (ref 22–32)
Calcium: 8.3 mg/dL — ABNORMAL LOW (ref 8.9–10.3)
Chloride: 110 mmol/L (ref 98–111)
Creatinine, Ser: 1.12 mg/dL (ref 0.61–1.24)
GFR, Estimated: 60 mL/min — ABNORMAL LOW (ref >60)
Glucose, Bld: 96 mg/dL (ref 70–99)
Potassium: 4 mmol/L (ref 3.5–5.1)
Sodium: 141 mmol/L (ref 135–145)

## 2022-06-24 LAB — AMMONIA: Ammonia: 13 umol/L (ref 9–35)

## 2022-06-24 LAB — HIV ANTIBODY (ROUTINE TESTING W REFLEX): HIV Screen 4th Generation wRfx: NONREACTIVE

## 2022-06-24 LAB — RPR: RPR Ser Ql: NONREACTIVE

## 2022-06-24 MED ORDER — LACTATED RINGERS IV SOLN: INTRAVENOUS | Status: DC

## 2022-06-24 MED ORDER — ADULT MULTIVITAMIN W/MINERALS CH
1.0000 | ORAL_TABLET | Freq: Every day | ORAL | Status: DC
  Administered 2022-06-24 – 2022-07-06 (×12): 1 via ORAL
  Filled 2022-06-24 (×13): qty 1

## 2022-06-24 MED ORDER — ORAL CARE MOUTH RINSE: 15.0000 mL | OROMUCOSAL | Status: DC | PRN

## 2022-06-24 NOTE — Progress Notes (Signed)
Patient awake and confused the entire night. 1:1 sitter at bedside. Patient with multiple attempts to climb out of bed, pull out IV and remove EKG leads. Patient swings and kicks at staff when we reposition him for safety, Medicated twice this shift as per PRN orders with short term relief. Will continue to monitor closely.

## 2022-06-24 NOTE — Progress Notes (Signed)
PROGRESS NOTE    Jordan [name]  CXK:[id number] DOB: Jul 13, 1923 DOA: 06/21/2022 PCP: Asencion Noble, MD     Brief Narrative:   Briefly history of memory issue for several years, h/o aricept intolerance, now on Namenda, baseline lives at home perform ADLs independently, history of HTN, HLD,, CAD status post DES stent, NIDDM2 sent to the hospital the second time in two days due to behavioral disturbance.It is reported that he has been combative and agitated for 3 days. He was brought to the ER on the fifth,     12/5: ct head showed  small chronic lacunar infarct within the right caudate nucleus, and possible small chronic lacunar infarct within the left lentiform nucleus. Moderate cerebral atrophy.  Small left forehead hematoma. Mri brain no acute findings,  12/5 CT angio headneck1. No evidence of large vessel occlusion. 2. Severe focal stenoses in the distal P1 and proximal P2 segments of the left PCA. 3. Approximately 75% stenosis at the origin of the right ICA. Peripheral  4. subsolid pulmonary opacity has increased in size compared to 2017 and is suspicious for malignancy. Recommend further evaluation with a dedicated chest CT. 12/5 cxr no acute findings   He with  was discharged home on 12/5  but brought back due to the same complaints,   Remain confused, case discussed with neurology Dr Leonel Ramsay who recommended transfer to [place] for long term EEG and LP. Daughter in agreement    Subjective:  Was agitated last night , received one dose haldol at 4;35am, had morning meds, currently sleeping, sitter at bedside   Getting iv abx and high dose iv thiamine , started on seroquel bid , continue hydration as oral intake is minimal, may need  to discuss short term ng tube if patient does not  wake up to eat   as his oral intake has been poor since 12/5 per daughter   Assessment & Plan:  Principal Problem:   Acute metabolic encephalopathy Active Problems:   Dementia with  behavioral disturbance (HCC)   Type 2 diabetes mellitus with complication (HCC)   Hypertension   Hyperlipidemia   Acute encephalopathy    Assessment and Plan:    Acute metabolic encephalopathy vs progressive of dementia -Daughter reports this is a sudden change - CT head this admission shows no acute intracranial abnormality - MRI brain on December 5 shows no acute intracranial process.  No evidence of acute or subacute infarct. -uds negative --will need to rule out secondary causes -blood culture 2/4 + GPC, contamination? empiric start on antibiotics, repeat blood culture -short term EEG,  RPR, ammonia, tsh unremarkable  -case discussed with neurology Dr Leonel Ramsay who recommended transfer patient to [place] for long term EEG and LP, daughter is in agreement  -started oral Seroquel ,  prn haldol,  avoid Ativan, continue  safety sitter  Hyperlipidemia - Continue statin  Hypertension - bp stable on current regimen   NIDDM2 -controlled, A1c 7.2 - Hold home meds metformin - Sliding scale coverage   Dementia with behavioral disturbance ([place]) - Apparently patient did not tolerate Aricept, has been on Namenda for several years  FTT: Daughter reports patient has macular degeneration, legally blind, he started to loose weight about a year ago, he started to use a walker about two months ago due to weakness.  he does has short term memory issues but has been very social and going to  church once a month, last a month ago.  he fell on thanksgiving day ,  that was his only fall. Daughter reports he is usually with a sound mind and able to discuss stock market with her but he woke up confused on 12/5 , this is a sudden change.   Seen by palliative care, he is DNR, full scope treatment currently.    Body mass index is 18.95 kg/m..     I have Reviewed nursing notes, Vitals, pain scores, I/o's, Lab results and  imaging results since pt's last encounter, details please see  discussion above  I ordered the following labs:  Unresulted Labs (From admission, onward)     Start     Ordered   06/25/22 0500  CBC with Differential/Platelet  Tomorrow morning,   R        06/24/22 1659   06/25/22 0500  Phosphorus  Tomorrow morning,   R        06/24/22 1659   06/24/22 0500  HIV Antibody (routine testing w rflx)  (HIV Antibody (Routine testing w reflex) panel)  Tomorrow morning,   R        06/23/22 1711   06/24/22 7425  Basic metabolic panel  Daily,   R      06/23/22 1719             DVT prophylaxis: heparin injection 5,000 Units Start: 06/22/22 0600 SCDs Start: 06/22/22 0509   Code Status:   Code Status: DNR  Family Communication: Daughter updated  daily Disposition:   Dispo: The patient is from: home, lives with daughter               Anticipated d/c is to: TBD              Anticipated d/c date is: TBD  Antimicrobials:   Anti-infectives (From admission, onward)    Start     Dose/Rate Route Frequency Ordered Stop   06/24/22 1345  vancomycin (VANCOREADY) IVPB 1250 mg/250 mL  Status:  Discontinued       See Hyperspace for full Linked Orders Report.   1,250 mg 166.7 mL/hr over 90 Minutes Intravenous Every 24 hours 06/23/22 1248 06/23/22 1250   06/24/22 1345  vancomycin (VANCOREADY) IVPB 1250 mg/250 mL  Status:  Discontinued       See Hyperspace for full Linked Orders Report.   1,250 mg 166.7 mL/hr over 90 Minutes Intravenous Every 24 hours 06/23/22 1250 06/23/22 1251   06/24/22 1345  vancomycin (VANCOREADY) IVPB 750 mg/150 mL  Status:  Discontinued       See Hyperspace for full Linked Orders Report.   750 mg 150 mL/hr over 60 Minutes Intravenous Every 24 hours 06/23/22 1251 06/23/22 1653   06/24/22 1200  cefTRIAXone (ROCEPHIN) 2 g in sodium chloride 0.9 % 100 mL IVPB        2 g 200 mL/hr over 30 Minutes Intravenous Every 24 hours 06/23/22 1653     06/23/22 1345  vancomycin (VANCOREADY) IVPB 1250 mg/250 mL  Status:  Discontinued       See Hyperspace  for full Linked Orders Report.   1,250 mg 166.7 mL/hr over 90 Minutes Intravenous  Once 06/23/22 1248 06/23/22 1250   06/23/22 1345  vancomycin (VANCOREADY) IVPB 750 mg/150 mL  Status:  Discontinued       See Hyperspace for full Linked Orders Report.   750 mg 150 mL/hr over 60 Minutes Intravenous  Once 06/23/22 1250 06/23/22 1251   06/23/22 1345  vancomycin (VANCOREADY) IVPB 1250 mg/250 mL       See Hyperspace for  full Linked Orders Report.   1,250 mg 166.7 mL/hr over 90 Minutes Intravenous  Once 06/23/22 1251 06/23/22 1630           Objective: Vitals:   06/23/22 2029 06/24/22 0500 06/24/22 0532 06/24/22 1325  BP: (!) 180/70  (!) 142/58 134/62  Pulse: (!) 108  95 74  Resp: 20  18 18   Temp: 98.9 F (37.2 C)  98.4 F (36.9 C) 98.8 F (37.1 C)  TempSrc: Axillary  Axillary Oral  SpO2:   96% 96%  Weight:  58.2 kg    Height:        Intake/Output Summary (Last 24 hours) at 06/24/2022 1659 Last data filed at 06/24/2022 1500 Gross per 24 hour  Intake 259.06 ml  Output 650 ml  Net -390.94 ml   Filed Weights   06/22/22 0002 06/23/22 0500 06/24/22 0500  Weight: 60 kg 58.3 kg 58.2 kg    Examination:  General exam: awake, very confused, intermittent combative , today he is very lethargic  Respiratory system: Clear to auscultation. Respiratory effort normal. Cardiovascular system:  RRR.  Gastrointestinal system: Abdomen is nondistended, soft and nontender.  Normal bowel sounds heard. Central nervous system: lethargic  Extremities:  no edema Skin: Diffuse ecchymosis Psychiatry: lethargic     Data Reviewed: I have personally reviewed  labs and visualized  imaging studies since the last encounter and formulate the plan        Scheduled Meds:  aspirin EC  81 mg Oral Q breakfast   budesonide  3 mg Oral Daily   enalapril  10 mg Oral Daily   finasteride  5 mg Oral Daily   heparin  5,000 Units Subcutaneous Q8H   insulin aspart  0-15 Units Subcutaneous TID WC    memantine  10 mg Oral BID   metoprolol tartrate  5 mg Intravenous Q8H   mirabegron ER  25 mg Oral Daily   pravastatin  80 mg Oral Daily   QUEtiapine  25 mg Oral BID   tamsulosin  0.4 mg Oral QPC supper   Continuous Infusions:  cefTRIAXone (ROCEPHIN)  IV 2 g (06/24/22 1316)   lactated ringers 100 mL/hr at 06/24/22 1453   thiamine (VITAMIN B1) injection 500 mg (06/24/22 1129)     LOS: 1 day     Florencia Reasons, MD PhD FACP [facility]  Available via Epic secure chat 7am-7pm for nonurgent issues Please page for urgent issues To page the attending provider between 7A-7P or the covering provider during after hours 7P-7A, please log into the web site [email or web address] and access using universal [place] password for that web site. If you do not have the password, please call the hospital operator.    06/24/2022, 4:59 PM

## 2022-06-24 NOTE — Progress Notes (Signed)
Initial Nutrition Assessment  DOCUMENTATION CODES:   Not applicable  INTERVENTION:   If enteral nutrition is within [place] and access is determined; Initiate Osmolite 1.2 at 20 mL/hr and advance by 10 mL every 8 hours to goal rate of 60 mL/hr 60 mL ProSource TF20 - daily 130 mL free water flush q6h  Provides 1808 kcal, 100 gm protein, and 1701 mL total free water daily. Liberalize pt diet to regular due to ongoing poor PO intake Multivitamin w/ minerals daily  NUTRITION DIAGNOSIS:   Inadequate oral intake related to decreased appetite as evidenced by meal completion < 25%.  GOAL:   Patient will meet greater than or equal to 90% of their needs  MONITOR:   PO intake, Labs, Weight trends  REASON FOR ASSESSMENT:   Consult Enteral/tube feeding initiation and management  ASSESSMENT:   88 y.o. male presented to the ED with behavioral disturbance, noted combative and agitation x 3 days. PMH includes CAD, T2DM, HTN, HLD, dementia, and diverticulosis. Pt admitted with acute metabolic encephalopathy.   RD working remotely at time of assessment. Pt noted with confusion and agitation, requiring a sitter for safety.  Meal intake includes 0% x 3 meals. Pt currently with no enteral nutritional access.  No weights recorded over the past year to assess weight loss.   Per MD note, plan to transfer to [facility] for long-term EEG and LP. Possible NG placement for poor PO intake.   Medications reviewed and include: Budesonide, NovoLog SSI, IV antibiotics, IV thiamine Labs reviewed: BUN 27, Vitamin B12 398, Hgb A1c 7.2%,  24 hr CBGs 88-148  NUTRITION - FOCUSED PHYSICAL EXAM:  Deferred to follow-up.   Diet Order:   Diet Order             Diet regular Room service appropriate? No; Fluid consistency: Thin  Diet effective now                   EDUCATION NEEDS:   Not appropriate for education at this time  Skin:  Skin Assessment: Reviewed RN Assessment  Last BM:   12/6  Height:   Ht Readings from Last 1 Encounters:  06/22/22 5\' 9"  (1.753 m)    Weight:   Wt Readings from Last 1 Encounters:  06/24/22 58.2 kg    Ideal Body Weight:  72.7 kg  BMI:  Body mass index is 18.95 kg/m.  Estimated Nutritional Needs:   Kcal:  1700-1900  Protein:  85-100 grams  Fluid:  >/= 1.7 L    Hermina Barters RD, LDN Clinical Dietitian See [facility] for contact information.

## 2022-06-25 ENCOUNTER — Inpatient Hospital Stay (HOSPITAL_COMMUNITY): Payer: Medicare Other

## 2022-06-25 DIAGNOSIS — E118 Type 2 diabetes mellitus with unspecified complications: Secondary | ICD-10-CM | POA: Diagnosis not present

## 2022-06-25 DIAGNOSIS — I10 Essential (primary) hypertension: Secondary | ICD-10-CM | POA: Diagnosis not present

## 2022-06-25 DIAGNOSIS — F03918 Unspecified dementia, unspecified severity, with other behavioral disturbance: Secondary | ICD-10-CM | POA: Diagnosis not present

## 2022-06-25 DIAGNOSIS — G9341 Metabolic encephalopathy: Secondary | ICD-10-CM | POA: Diagnosis not present

## 2022-06-25 LAB — CULTURE, BLOOD (ROUTINE X 2): Special Requests: ADEQUATE

## 2022-06-25 LAB — GLUCOSE, CAPILLARY
Glucose-Capillary: 129 mg/dL — ABNORMAL HIGH (ref 70–99)
Glucose-Capillary: 139 mg/dL — ABNORMAL HIGH (ref 70–99)
Glucose-Capillary: 106 mg/dL — ABNORMAL HIGH (ref 70–99)
Glucose-Capillary: 124 mg/dL — ABNORMAL HIGH (ref 70–99)

## 2022-06-25 MED ORDER — SODIUM CHLORIDE 0.9 % IV SOLN: INTRAVENOUS | Status: AC

## 2022-06-25 MED ORDER — IOHEXOL 350 MG/ML SOLN
75.0000 mL | Freq: Once | INTRAVENOUS | Status: AC | PRN
  Administered 2022-06-25: 75 mL via INTRAVENOUS

## 2022-06-25 MED ORDER — LORAZEPAM 2 MG/ML IJ SOLN
1.0000 mg | Freq: Four times a day (QID) | INTRAMUSCULAR | Status: DC | PRN
  Administered 2022-06-25: 1 mg via INTRAVENOUS
  Filled 2022-06-25: qty 1

## 2022-06-25 MED ORDER — ENSURE ENLIVE PO LIQD
237.0000 mL | Freq: Three times a day (TID) | ORAL | Status: DC
  Administered 2022-06-26 – 2022-07-06 (×23): 237 mL via ORAL
  Filled 2022-06-25: qty 237

## 2022-06-25 NOTE — Progress Notes (Signed)
Neurology Progress Note  Brief HPI: 88 year old Jordan [name] with history of memory impairment, hypertension, hyperlipidemia, stent and diabetes initially presented with behavioral disturbance, confusion and left facial droop.  According to Jordan [name]'s daughter, he was alert and oriented several days prior to admission and was discussing the stock market.  He is normally able to perform his own ADLs.  He has been afebrile, and white blood cell count was 3.9 yesterday morning.  Blood culture, however was positive for Staphylococcus species.  Infection could be the cause of Jordan [name]'s delirium.  Routine EEG is negative for seizures, will obtain long-term EEG to rule out nonconvulsive seizures causing confusion, as well as lumbar puncture to rule out infectious or autoimmune encephalitis or meningitis.    Subjective: Jordan [name] appears calm but drowsy.  He has recently received morphine for pain.  He is able to state his name but believes that he is at home.  He is cooperative with exam and can only follow simple commands  Exam: Vitals:   06/25/22 0544 06/25/22 0802  BP: 126/66 (!) 154/75  Pulse: 66 93  Resp:  18  Temp:  98.4 F (36.9 C)  SpO2:  92%   Gen: In bed, NAD Resp: non-labored breathing, no acute distress  Neuro: Mental Status: Oriented to self only, disoriented to place time and situation, able to follow simple but not two-step commands Cranial Nerves: Pupils equal round and reactive, extraocular movements intact, face symmetrical, phonation normal, hearing intact to voice, tongue midline, shoulder shrug symmetrical Motor: Able to move all extremities to command with good antigravity strength Sensory: Appears intact to light touch throughout DTR: Unable to elicit Gait: Deferred  Pertinent Labs:    Latest Ref Rng & Units 06/24/2022    8:55 AM 06/23/2022    4:57 AM 06/22/2022    5:21 AM  CBC  WBC 4.0 - 10.5 K/uL 3.9  6.9  6.7   Hemoglobin 13.0 - 17.0 g/dL 10.0  11.2  10.3   Hematocrit  39.0 - 52.0 % 31.4  35.8  32.5   Platelets 150 - 400 K/uL 289  346  292        Latest Ref Rng & Units 06/24/2022    8:55 AM 06/23/2022    4:57 AM 06/22/2022    8:20 AM  BMP  Glucose 70 - 99 mg/dL 96  141  92   BUN 8 - 23 mg/dL 27  27  31    Creatinine 0.61 - 1.24 mg/dL 1.12  1.10  1.06   Sodium 135 - 145 mmol/L 141  144  138   Potassium 3.5 - 5.1 mmol/L 4.0  5.3  4.2   Chloride 98 - 111 mmol/L 110  110  106   CO2 22 - 32 mmol/L 22  25  27    Calcium 8.9 - 10.3 mg/dL 8.3  8.8  8.6   RPR: Negative TSH: 1.357 Blood culture: Positive for Staphylococcus species, likely contaminant B12: 398 Ammonia: 13 Thiamine not performed as supplementation already started  Imaging Reviewed:  CT head: No acute abnormality, atrophy and chronic microvascular ischemic disease  Assessment: 88 year old Jordan [name] who is normally alert and oriented and performs his own ADLs with history of hypertension, hyperlipidemia, CAD and diabetes presents with a few days of confusion.  According to his daughter, he is normally alert and oriented and able to discuss the stock market, but he has had memory issues for several years and is on memantine.  It is currently alert and oriented x 1 only. Recent  administration of benzodiazepines and opiates may have contributed to Jordan [name]'s delirium.  There is a questions of a lung mass, and CT CAP will be performed to investigate for malignancy.  There is a question of a paraneoplastic process causing altered mental status if malignancy is found on CT.  Impression: Altered mental status due to delirium and Jordan [name] with positive blood cultures  Recommendations: 1) await results of CT CAP, concern for paraneoplastic process 2) avoid opiates and benzodiazepines as these are deliriogenic, would treat agitation with low-dose antipsychotic 3) overnight EEG to rule out seizure activity  4) consider lumbar puncture to rule out paraneoplastic encephalitis if this is in line with goals of  care  [place] , MSN, AGACNP-BC [facility] See Amion for schedule and pager information 06/25/2022 9:14 AM    NEUROHOSPITALIST ADDENDUM Performed a face to face diagnostic evaluation.   I have reviewed the contents of history and physical exam as documented by PA/ARNP/Resident and agree with above documentation.  I have discussed and formulated the above plan as documented. Edits to the note have been made as needed.  Impression/Key exam findings/Plan: 33M with confusion and perseveration. Initially presented on 12/5 and discharged home from ED. Brought in again and admitted. No obvious focal deficit on exam and seems to be more consistent with potential dementia. However, workup so far with no obvious infection or other metabolic etiology to his presentation. MRI Brain is negative. Routine EEG with no seizures. Encephlaopathy labs are non revealing. He was transferred to [facility] for cEEG and did undergo several hours of LTM which was negative for seizures. He was noted to have single positive blood culture with Gram positive bacteria and felt to be contaminant.  He had CT Chest, Abd. Pelvis yesterday for a malignancy screen which is concerning for a solid lung nodule which has increased in size from prior and concerning for a indolent adenocarcinoma. Paraneoplastic process is certainly on differential but given his advanced age and fraility, will need to clarify goals of care with daughter before further workup with LP or empiric steroids.  Would also recommend strict delirium precautions. Donnetta Simpers, MD [facility] [phone]   If 7pm to 7am, please call on call as listed on AMION.

## 2022-06-25 NOTE — Plan of Care (Addendum)
Still been unable to get patient to eat and/or swallow pills. Have attempted numerous times throughout the day and family even attempted when they were here.   MD Ghimire was informed that family was at bedside and he stated that "Will update family when we have done a bit more work up. Nothing has changed from yesterday to today". Informed the family of this and explained the tests that we had ordered and what we had done for the patient today. Daughter seemed very concerned over patient not getting oral nutrition. Explained to the daughter that she could talk to the Dr about this tomorrow as he was wanting to see the test results for the tests we have ordered in order to make a proper plan of care. Daughter stated that they ordered a feeding tube yesterday for the patient but I could not find any order stating to insert/maintain a feeding tube. I let the daughter know this and told her she could talk to the doctor in the morning and they could discuss goals of care and how to proceed. MD also notified of daughter's concerns including asking about feeding tube. No new orders given at this time.    Found the order for the Dobbhoff tube that was placed yesterday. It is still active. Reached out to MD to see if he still wants this placed or wanted to wait until in the morning. Awaiting response.   MD responded saying that the [facility] team is not in house until tomorrow (Monday). He had me reach out to the daughter and see if she could be at the bedside at 8am tomorrow morning to discuss patient's care. Called the daughter, Jordan [name], and let her know. She stated that she would be here at 8am.

## 2022-06-25 NOTE — Progress Notes (Signed)
Report given to [facility] and [facility].  Patient left floor in stable condition. Vitals stable, Patient alert x 1, but responsive and answers one word questions.

## 2022-06-25 NOTE — Progress Notes (Signed)
LTM EEG hooked up and running - no initial skin breakdown - push button tested - [facility] monitoring.  

## 2022-06-25 NOTE — Progress Notes (Signed)
LTM EEG discontinued - no skin breakdown at unhook.   

## 2022-06-25 NOTE — Progress Notes (Addendum)
PROGRESS NOTE        PATIENT DETAILS Name: Jordan [name] Age: 88 y.o. Sex: male Date of Birth: [birth year] Admit Date: 06/21/2022 Admitting Physician Florencia Reasons, MD [id number], [name], MD  Brief Summary: Patient is a 88 y.o.  male with history of aortic stenosis (not TAVR candidate) dementia, HTN, BPH, DM-2, HLD, lymphocytic colitis-who presented to altered mental status.  Patient was transferred to [facility] for LTM EEG/LP/neurology evaluation.  Workup in progress-see details below.  Significant events: 12/05>> [facility] ED visit for weakness/falls/confusion.  Discharged home. 12/07>> admit to [facility] for worsening confusion 12/10>> transferred to [facility] for neurology/LTM EEG/possible LP-given ongoing confusion.  Significant studies: 12/05>> CT head: No acute intracranial abnormality 12/05>> CTA head/neck: No LVO, 75% stenosis of right ICA at the origin. 12/05>> MRI brain: No acute/subacute CVA show 12/05>> EEG: No seizures.   12/07>> CT head: No acute intracranial abnormality. 12/07>> TSH: 1.35 12/08>> EEG: No seizures. 12/08>> vitamin B12: 398 (normal) 12/09>> ammonia: 13 (normal) 12/09>> RPR: Nonreactive  Significant microbiology data: 12/07>> blood culture: Gram-positive cocci (likely contamination) 12/09>> blood culture: No growth  Procedures: None  Consults: Palliative care Neurology  Subjective: Confused-able to tell me his name and age.  No family at bedside.  Moving all 4 extremities.  Objective: Vitals: Blood pressure (!) 154/75, pulse 93, temperature 98.4 F (36.9 C), temperature source Oral, resp. rate 18, height 5\' 9"  (1.753 m), weight 58.3 kg, SpO2 92 %.   Exam: Gen Exam: Confused HEENT:atraumatic, normocephalic Chest: B/L clear to auscultation anteriorly CVS:S1S2 regular 3/6 systolic murmur heard all over the precordium. Abdomen:soft non tender, non distended Extremities:no edema Neurology: Appears nonfocal Skin: no rash  Pertinent  Labs/Radiology:    Latest Ref Rng & Units 06/24/2022    8:55 AM 06/23/2022    4:57 AM 06/22/2022    5:21 AM  CBC  WBC 4.0 - 10.5 K/uL 3.9  6.9  6.7   Hemoglobin 13.0 - 17.0 g/dL 10.0  11.2  10.3   Hematocrit 39.0 - 52.0 % 31.4  35.8  32.5   Platelets 150 - 400 K/uL 289  346  292     Lab Results  Component Value Date   NA 141 06/24/2022   K 4.0 06/24/2022   CL 110 06/24/2022   CO2 22 06/24/2022      Assessment/Plan: Acute metabolic encephalopathy Seems to have advanced dementia-worsening confusion over the past 1 week of unclear etiology.  Not clear if this is terminal dementia or some other process. Workup negative so far Neurology planning LTM EEG/possible LP Appears to have a enlarging lung mass-unclear if AMS represents a paraneoplastic process-will get a CT chest to evaluate possible lung mass better.  Gram-positive bacteremia Likely contamination Repeat blood cultures negative Stop Rocephin and monitor  History of lymphocytic colitis On Entocort  HLD Continue statin  HTN BP stable-continue enalapril, metoprolol  Moderate to severe aortic stenosis Recently evaluated by structural cardiology-not a candidate for TAVR  DM-2 (A1c 7.2 on 12/07) CBGs stable on SSI  Recent Labs    06/24/22 1611 06/24/22 2200 06/25/22 0747  GLUCAP 109* 135* 129*     Peripheral subsolid pulmonary opacity Per radiology report-increasing in size-suspicious for malignancy Obtain CT chest-see above documentation  BPH Continue Flomax/finasteride Frequent bladder scans to ensure no retention.  Dementia See above On Namenda  Nutrition Status: Nutrition Problem: Inadequate oral intake  Etiology: decreased appetite Signs/Symptoms: meal completion < 25% Interventions: Refer to RD note for recommendations  Underweight: Estimated body mass index is 18.98 kg/m as calculated from the following:   Height as of this encounter: 5\' 9"  (1.753 m).   Weight as of this encounter: 58.3  kg.   Code status:   Code Status: DNR   DVT Prophylaxis: heparin injection 5,000 Units Start: 06/22/22 0600 SCDs Start: 06/22/22 7408    Family Communication: none at bedside  Addendum: after d/w RN-family meeting scheduled for 8 am-hopefully we will have some of the pending work up ordered today, to set up a treatment plan.   Disposition Plan: Status is: Inpatient Remains inpatient appropriate because: Severe encephalopathy-workup in progress.   Planned Discharge Destination:Skilled nursing facility versus home health.   Diet: Diet Order             Diet regular Room service appropriate? No; Fluid consistency: Thin  Diet effective now                     Antimicrobial agents: Anti-infectives (From admission, onward)    Start     Dose/Rate Route Frequency Ordered Stop   06/24/22 1345  vancomycin (VANCOREADY) IVPB 1250 mg/250 mL  Status:  Discontinued       See Hyperspace for full Linked Orders Report.   1,250 mg 166.7 mL/hr over 90 Minutes Intravenous Every 24 hours 06/23/22 1248 06/23/22 1250   06/24/22 1345  vancomycin (VANCOREADY) IVPB 1250 mg/250 mL  Status:  Discontinued       See Hyperspace for full Linked Orders Report.   1,250 mg 166.7 mL/hr over 90 Minutes Intravenous Every 24 hours 06/23/22 1250 06/23/22 1251   06/24/22 1345  vancomycin (VANCOREADY) IVPB 750 mg/150 mL  Status:  Discontinued       See Hyperspace for full Linked Orders Report.   750 mg 150 mL/hr over 60 Minutes Intravenous Every 24 hours 06/23/22 1251 06/23/22 1653   06/24/22 1200  cefTRIAXone (ROCEPHIN) 2 g in sodium chloride 0.9 % 100 mL IVPB        2 g 200 mL/hr over 30 Minutes Intravenous Every 24 hours 06/23/22 1653     06/23/22 1345  vancomycin (VANCOREADY) IVPB 1250 mg/250 mL  Status:  Discontinued       See Hyperspace for full Linked Orders Report.   1,250 mg 166.7 mL/hr over 90 Minutes Intravenous  Once 06/23/22 1248 06/23/22 1250   06/23/22 1345  vancomycin (VANCOREADY)  IVPB 750 mg/150 mL  Status:  Discontinued       See Hyperspace for full Linked Orders Report.   750 mg 150 mL/hr over 60 Minutes Intravenous  Once 06/23/22 1250 06/23/22 1251   06/23/22 1345  vancomycin (VANCOREADY) IVPB 1250 mg/250 mL       See Hyperspace for full Linked Orders Report.   1,250 mg 166.7 mL/hr over 90 Minutes Intravenous  Once 06/23/22 1251 06/23/22 1630        MEDICATIONS: Scheduled Meds:  aspirin EC  81 mg Oral Q breakfast   budesonide  3 mg Oral Daily   enalapril  10 mg Oral Daily   feeding supplement  237 mL Oral TID PC   finasteride  5 mg Oral Daily   heparin  5,000 Units Subcutaneous Q8H   insulin aspart  0-15 Units Subcutaneous TID WC   memantine  10 mg Oral BID   metoprolol tartrate  5 mg Intravenous Q8H   mirabegron ER  25  mg Oral Daily   multivitamin with minerals  1 tablet Oral Daily   pravastatin  80 mg Oral Daily   QUEtiapine  25 mg Oral BID   tamsulosin  0.4 mg Oral QPC supper   Continuous Infusions:  cefTRIAXone (ROCEPHIN)  IV 2 g (06/24/22 1316)   lactated ringers 100 mL/hr at 06/25/22 0758   thiamine (VITAMIN B1) injection 500 mg (06/24/22 1129)   PRN Meds:.acetaminophen **OR** acetaminophen, haloperidol lactate, hydrALAZINE, labetalol, LORazepam, morphine injection, ondansetron **OR** ondansetron (ZOFRAN) IV, mouth rinse, oxyCODONE   I have personally reviewed following labs and imaging studies  LABORATORY DATA: CBC: Recent Labs  Lab 06/20/22 1108 06/20/22 1125 06/22/22 0008 06/22/22 0521 06/23/22 0457 06/24/22 0855  WBC 6.5  --  6.2 6.7 6.9 3.9*  NEUTROABS 4.5  --  4.4 4.8  --  2.5  HGB 12.0* 11.9* 10.3* 10.3* 11.2* 10.0*  HCT 37.5* 35.0* 32.1* 32.5* 35.8* 31.4*  MCV 92.1  --  92.5 92.1 93.2 91.5  PLT 366  --  319 292 346 681    Basic Metabolic Panel: Recent Labs  Lab 06/22/22 0008 06/22/22 0521 06/22/22 0820 06/23/22 0457 06/24/22 0855  NA 136 136 138 144 141  K 4.3 5.7* 4.2 5.3* 4.0  CL 102 105 106 110 110  CO2  23 23 27 25 22   GLUCOSE 173* 167* 92 141* 96  BUN 33* 32* 31* 27* 27*  CREATININE 1.27* 1.17 1.06 1.10 1.12  CALCIUM 8.5* 8.3* 8.6* 8.8* 8.3*  MG  --  2.5*  --   --   --     GFR: Estimated Creatinine Clearance: 31.1 mL/min (by C-G formula based on SCr of 1.12 mg/dL).  Liver Function Tests: Recent Labs  Lab 06/20/22 1108 06/22/22 0521 06/23/22 0457  AST 21 44* 27  ALT 21 22 20   ALKPHOS 103 90 97  BILITOT 0.5 1.2 0.4  PROT 6.8 5.9* 6.1*  ALBUMIN 3.4* 2.9* 3.1*   No results for input(s): "LIPASE", "AMYLASE" in the last 168 hours. Recent Labs  Lab 06/20/22 1407 06/24/22 0855  AMMONIA 15 13    Coagulation Profile: Recent Labs  Lab 06/20/22 1108  INR 1.1    Cardiac Enzymes: Recent Labs  Lab 06/23/22 0457  CKTOTAL 164    BNP (last 3 results) No results for input(s): "PROBNP" in the last 8760 hours.  Lipid Profile: No results for input(s): "CHOL", "HDL", "LDLCALC", "TRIG", "CHOLHDL", "LDLDIRECT" in the last 72 hours.  Thyroid Function Tests: No results for input(s): "TSH", "T4TOTAL", "FREET4", "T3FREE", "THYROIDAB" in the last 72 hours.  Anemia Panel: Recent Labs    06/23/22 0457  VITAMINB12 398    Urine analysis:    Component Value Date/Time   COLORURINE STRAW (A) 06/22/2022 0509   APPEARANCEUR CLEAR 06/22/2022 0509   APPEARANCEUR Clear 03/17/2022 1240   LABSPEC 1.008 06/22/2022 0509   PHURINE 7.0 06/22/2022 0509   GLUCOSEU NEGATIVE 06/22/2022 0509   HGBUR NEGATIVE 06/22/2022 0509   BILIRUBINUR NEGATIVE 06/22/2022 0509   BILIRUBINUR negative 04/13/2022 1525   BILIRUBINUR Negative 03/17/2022 1240   KETONESUR NEGATIVE 06/22/2022 0509   PROTEINUR NEGATIVE 06/22/2022 0509   UROBILINOGEN 1.0 04/13/2022 1525   NITRITE NEGATIVE 06/22/2022 0509   LEUKOCYTESUR NEGATIVE 06/22/2022 0509    Sepsis Labs: Lactic Acid, Venous No results found for: "LATICACIDVEN"  MICROBIOLOGY: Recent Results (from the past 240 hour(s))  Culture, blood (Routine X 2) w  Reflex to ID Panel     Status: None (Preliminary result)   Collection  Time: 06/22/22  5:03 PM   Specimen: Left Antecubital; Blood  Result Value Ref Range Status   Specimen Description   Final    LEFT ANTECUBITAL Performed at [facility], [address] [place], [place] [address]    Special Requests   Final    BOTTLES DRAWN AEROBIC AND ANAEROBIC Blood Culture adequate volume Performed at [facility], [address], [place], [place] [address]    Culture  Setup Time   Final    GRAM POSITIVE COCCI IN BOTH AEROBIC AND ANAEROBIC BOTTLES Gram Stain Report Called to,Read Back By and Verified With: COLEMAN @ 0277 ON [id number] BY HENDERSON L CRITICAL RESULT CALLED TO, READ BACK BY AND VERIFIED WITH: [place] ON 06/23/22 @ 1619 BY DRT    Culture   Final    GRAM POSITIVE COCCI IDENTIFICATION TO FOLLOW Performed at [facility], [place] [address] [place], [place] [address]    Report Status PENDING  Incomplete  Culture, blood (Routine X 2) w Reflex to ID Panel     Status: None (Preliminary result)   Collection Time: 06/22/22  5:03 PM   Specimen: Right Antecubital; Blood  Result Value Ref Range Status   Specimen Description RIGHT ANTECUBITAL  Final   Special Requests   Final    BOTTLES DRAWN AEROBIC AND ANAEROBIC Blood Culture adequate volume   Culture   Final    NO GROWTH 3 DAYS Performed at [facility], [address], [place], [place] [address]    Report Status PENDING  Incomplete  Blood Culture ID Panel (Reflexed)     Status: Abnormal   Collection Time: 06/22/22  5:03 PM  Result Value Ref Range Status   Enterococcus faecalis NOT DETECTED NOT DETECTED Final   Enterococcus Faecium NOT DETECTED NOT DETECTED Final   Listeria monocytogenes NOT DETECTED NOT DETECTED Final   Staphylococcus species DETECTED (A) NOT DETECTED Final    Comment: CRITICAL RESULT CALLED TO, READ BACK BY AND VERIFIED WITH: [place] ON 06/23/22 @ 1619 BY DRT    Staphylococcus aureus (BCID)  NOT DETECTED NOT DETECTED Final   Staphylococcus epidermidis NOT DETECTED NOT DETECTED Final   Staphylococcus lugdunensis NOT DETECTED NOT DETECTED Final   Streptococcus species NOT DETECTED NOT DETECTED Final   Streptococcus agalactiae NOT DETECTED NOT DETECTED Final   Streptococcus pneumoniae NOT DETECTED NOT DETECTED Final   Streptococcus pyogenes NOT DETECTED NOT DETECTED Final   A.calcoaceticus-baumannii NOT DETECTED NOT DETECTED Final   Bacteroides fragilis NOT DETECTED NOT DETECTED Final   Enterobacterales NOT DETECTED NOT DETECTED Final   Enterobacter cloacae complex NOT DETECTED NOT DETECTED Final   Escherichia coli NOT DETECTED NOT DETECTED Final   Klebsiella aerogenes NOT DETECTED NOT DETECTED Final   Klebsiella oxytoca NOT DETECTED NOT DETECTED Final   Klebsiella pneumoniae NOT DETECTED NOT DETECTED Final   Proteus species NOT DETECTED NOT DETECTED Final   Salmonella species NOT DETECTED NOT DETECTED Final   Serratia marcescens NOT DETECTED NOT DETECTED Final   Haemophilus influenzae NOT DETECTED NOT DETECTED Final   Neisseria meningitidis NOT DETECTED NOT DETECTED Final   Pseudomonas aeruginosa NOT DETECTED NOT DETECTED Final   Stenotrophomonas maltophilia NOT DETECTED NOT DETECTED Final   Candida albicans NOT DETECTED NOT DETECTED Final   Candida auris NOT DETECTED NOT DETECTED Final   Candida glabrata NOT DETECTED NOT DETECTED Final   Candida krusei NOT DETECTED NOT DETECTED Final   Candida parapsilosis NOT DETECTED NOT DETECTED Final   Candida tropicalis NOT  DETECTED NOT DETECTED Final   Cryptococcus neoformans/gattii NOT DETECTED NOT DETECTED Final    Comment: Performed at [facility], [place] [address], [place], [place] [address]  MRSA Next Gen by PCR, Nasal     Status: None   Collection Time: 06/23/22  5:27 PM   Specimen: Nasal Mucosa; Nasal Swab  Result Value Ref Range Status   MRSA by PCR Next Gen NOT DETECTED NOT DETECTED Final    Comment: (NOTE) The  GeneXpert MRSA Assay (FDA approved for NASAL specimens only), is one component of a comprehensive MRSA colonization surveillance program. It is not intended to diagnose MRSA infection nor to guide or monitor treatment for MRSA infections. Test performance is not FDA approved in patients less than 53 years old. Performed at [facility], [address], [place], [place] [address]   Culture, blood (Routine X 2) w Reflex to ID Panel     Status: None (Preliminary result)   Collection Time: 06/24/22  8:55 AM   Specimen: Right Antecubital  Result Value Ref Range Status   Specimen Description   Final    RIGHT ANTECUBITAL BOTTLES DRAWN AEROBIC AND ANAEROBIC   Special Requests   Final    Blood Culture results may not be optimal due to an excessive volume of blood received in culture bottles   Culture   Final    NO GROWTH < 24 HOURS Performed at [facility], [address], [place], [place] [address]    Report Status PENDING  Incomplete  Culture, blood (Routine X 2) w Reflex to ID Panel     Status: None (Preliminary result)   Collection Time: 06/24/22  8:55 AM   Specimen: BLOOD RIGHT HAND  Result Value Ref Range Status   Specimen Description   Final    BLOOD RIGHT HAND BOTTLES DRAWN AEROBIC AND ANAEROBIC   Special Requests   Final    Blood Culture results may not be optimal due to an excessive volume of blood received in culture bottles   Culture   Final    NO GROWTH < 24 HOURS Performed at [facility], [address] [place], [place] [address]    Report Status PENDING  Incomplete    RADIOLOGY STUDIES/RESULTS: EEG adult  Result Date: 06/23/2022 Landry Corporal, MD     06/23/2022  3:32 PM [facility] Routine EEG Report Demographics: Patient Name:   [name] Date of Birth:   02/26/1923 Identification Number:   MRN - [id number] Study Times: Study Start Time:   06/23/2022 14:19:56 Study End Time:   06/23/2022 14:43:35 Duration:   23  minutes Indication(s): Encephalopathy Technical Summary: This EEG was performed utilizing standard International 10-20 System of electrode placement. Data were obtained and interpreted utilizing referential montage recording, with reformatting to longitudinal, transverse bipolar, and referential montages as necessary for interpretation. State(s):       Awake Activation Procedures: Hyperventilation: Not performed Photic Stimulation: Not performed EEG Description: No normal patterns of awake or sleep were present during this recording. Intermittent generalized theta-delta slowing was present. Excess continuous generalized theta-delta slowing was present. Minor muscle, motion, electrode, and eye movement artifacts were occasionally noted. Impression: Abnormal EEG due to:   1) No normal patterns of awake or sleep  2) Intermittent generalized theta-delta slowing  3) Continuous generalized theta-delta slowing  Clinical Correlation: This EEG is suggestive of a moderate encephalopathy, but is nonspecific as to etiology. The absence of epileptiform abnormalities does not preclude a clinical diagnosis of  seizures. [facility] For Inpatient follow-up with [facility] physician please call [facility] [phone]. This is not an outpatient service. Post hospital discharge, please contact hospital directly. Please do not communicate with [facility] physicians via secure chat. If you have any questions, Please contact [facility].    LOS: 2 days   Oren Binet, MD  [facility]    To contact the attending provider between 7A-7P or the covering provider during after hours 7P-7A, please log into the web site [email or web address] and access using universal [place] password for that web site. If you do not have the password, please call the hospital operator.  06/25/2022, 9:11 AM

## 2022-06-25 NOTE — Plan of Care (Signed)

## 2022-06-25 NOTE — Progress Notes (Signed)
Per RN pt is really agitated and in pain, she will let me know when it is a good time for EEG. Left # with RN

## 2022-06-25 NOTE — Plan of Care (Signed)
Attempted to give patient oral medications around 0930. Patient was unable to wake up and follow commands. MD notified and will attempt later.   Attempted to give patient oral medications again around 1230. Patient was awake but would still not follow commands enough to safely swallow. MD notified and orders received to continue trying when possible.

## 2022-06-25 NOTE — Progress Notes (Signed)
Notified daughter [name] of transfer. All questions answered and verbalized understanding.

## 2022-06-26 DIAGNOSIS — Z515 Encounter for palliative care: Secondary | ICD-10-CM

## 2022-06-26 DIAGNOSIS — F03918 Unspecified dementia, unspecified severity, with other behavioral disturbance: Secondary | ICD-10-CM | POA: Diagnosis not present

## 2022-06-26 DIAGNOSIS — I10 Essential (primary) hypertension: Secondary | ICD-10-CM | POA: Diagnosis not present

## 2022-06-26 DIAGNOSIS — E118 Type 2 diabetes mellitus with unspecified complications: Secondary | ICD-10-CM | POA: Diagnosis not present

## 2022-06-26 DIAGNOSIS — G9341 Metabolic encephalopathy: Secondary | ICD-10-CM | POA: Diagnosis not present

## 2022-06-26 DIAGNOSIS — Z66 Do not resuscitate: Secondary | ICD-10-CM | POA: Diagnosis not present

## 2022-06-26 DIAGNOSIS — R4182 Altered mental status, unspecified: Secondary | ICD-10-CM | POA: Diagnosis not present

## 2022-06-26 LAB — BASIC METABOLIC PANEL
Anion gap: 11 (ref 5–15)
BUN: 23 mg/dL (ref 8–23)
CO2: 21 mmol/L — ABNORMAL LOW (ref 22–32)
Calcium: 8.2 mg/dL — ABNORMAL LOW (ref 8.9–10.3)
Chloride: 107 mmol/L (ref 98–111)
Creatinine, Ser: 1.06 mg/dL (ref 0.61–1.24)
GFR, Estimated: >60 mL/min (ref >60)
Glucose, Bld: 97 mg/dL (ref 70–99)
Potassium: 3.9 mmol/L (ref 3.5–5.1)
Sodium: 139 mmol/L (ref 135–145)

## 2022-06-26 LAB — GLUCOSE, CAPILLARY
Glucose-Capillary: 91 mg/dL (ref 70–99)
Glucose-Capillary: 148 mg/dL — ABNORMAL HIGH (ref 70–99)
Glucose-Capillary: 128 mg/dL — ABNORMAL HIGH (ref 70–99)
Glucose-Capillary: 115 mg/dL — ABNORMAL HIGH (ref 70–99)

## 2022-06-26 MED ORDER — QUETIAPINE FUMARATE 25 MG PO TABS
50.0000 mg | ORAL_TABLET | Freq: Every day | ORAL | Status: DC
  Administered 2022-06-26: 50 mg via ORAL
  Filled 2022-06-26: qty 2

## 2022-06-26 MED ORDER — QUETIAPINE FUMARATE 25 MG PO TABS
25.0000 mg | ORAL_TABLET | Freq: Every morning | ORAL | Status: DC
  Administered 2022-06-26: 25 mg via ORAL
  Filled 2022-06-26: qty 1

## 2022-06-26 MED ORDER — MELATONIN 3 MG PO TABS
3.0000 mg | ORAL_TABLET | Freq: Every day | ORAL | Status: DC
  Administered 2022-06-26: 3 mg via ORAL
  Filled 2022-06-26: qty 1

## 2022-06-26 MED ORDER — SODIUM CHLORIDE 0.9 % IV SOLN: INTRAVENOUS | Status: AC

## 2022-06-26 NOTE — Progress Notes (Addendum)
PROGRESS NOTE        PATIENT DETAILS Name: Jordan [name] Age: 88 y.o. Sex: male Date of Birth: 08-18-1922 Admit Date: 06/21/2022 Admitting Physician Florencia Reasons, MD VZD:GLOVF, [name], MD  Brief Summary: Patient is a 88 y.o.  male with history of aortic stenosis (not TAVR candidate) dementia, HTN, BPH, DM-2, HLD, lymphocytic colitis-who presented to altered mental status.  Patient was transferred to [facility] for LTM EEG/LP/neurology evaluation.  Workup in progress-see details below.  Significant events: 12/05>> [facility] ED visit for weakness/falls/confusion.  Discharged home. 12/07>> admit to [facility] for worsening confusion 12/10>> transferred to [facility] for neurology/LTM EEG/possible LP-given ongoing confusion.  Significant studies: 12/05>> CT head: No acute intracranial abnormality 12/05>> CTA head/neck: No LVO, 75% stenosis of right ICA at the origin. 12/05>> MRI brain: No acute/subacute CVA show 12/05>> EEG: No seizures.   12/07>> CT head: No acute intracranial abnormality. 12/07>> TSH: 1.35 12/08>> EEG: No seizures. 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  Significant microbiology data: 12/07>> blood culture:Staph warneri (likely contamination) 12/09>> blood culture: No growth  Procedures: None  Consults: Palliative care Neurology  Subjective: Seems to be much more awake alert compared to yesterday.  Able to tell me his name, age, his daughter's name.  Understood he was in the hospital.  Followed some of morning commands-moving legs when asked,shakes my hand when asked.  Ate applesauce when given this morning-when I left the room-bedside RN was feeding him breakfast which she was very cooperative  Objective: Vitals: Blood pressure 111/80, pulse 74, temperature 98.4 F (36.9 C), temperature source Oral, resp.  rate 18, height 5\' 9"  (1.753 m), weight 58.4 kg, SpO2 98 %.   Exam: Gen Exam:not in any distress HEENT:atraumatic, normocephalic Chest: B/L clear to auscultation anteriorly CVS:S1S2 regular- 3/6 systolic murmur Abdomen:soft non tender, non distended Extremities:no edema Neurology: Non focal Skin: no rash  Pertinent Labs/Radiology:    Latest Ref Rng & Units 06/24/2022    8:55 AM 06/23/2022    4:57 AM 06/22/2022    5:21 AM  CBC  WBC 4.0 - 10.5 K/uL 3.9  6.9  6.7   Hemoglobin 13.0 - 17.0 g/dL 10.0  11.2  10.3   Hematocrit 39.0 - 52.0 % 31.4  35.8  32.5   Platelets 150 - 400 K/uL 289  346  292     Lab Results  Component Value Date   NA 139 06/26/2022   K 3.9 06/26/2022   CL 107 06/26/2022   CO2 21 (L) 06/26/2022      Assessment/Plan: Acute metabolic encephalopathy Seems to have improved overnight No reversible causes identified so far-no seizures detected on LTM EEG Either this is delirium related to dementia or possible paraneoplastic encephalopathy related to lung cancer. Difficult situation-advanced age-frailty-do not know that performing LP (will be challenging with the degree of delirium that he has)-and trying to diagnose paraneoplastic encephalitis is going to change outcome or management. Long discussion with daughter [name] over the phone this morning by this MD-we went over different treatment options-before proceeding any further-we have agreed to have a goals of care discussion with the palliative care team.  Staph Warneri bacteremia Likely contamination Repeat blood cultures negative No longer on Rocephin.  History of lymphocytic colitis On Entocort  HLD Continue statin  HTN BP stable-continue enalapril, metoprolol  Moderate to severe aortic stenosis Recently evaluated by structural cardiology-not a candidate for TAVR  DM-2 (A1c 7.2 on 12/07) CBGs stable on SSI  Recent Labs    06/25/22 1643 06/25/22 2105 06/26/22 0744  GLUCAP 106* 124* 91       Left upper lobe lung mass-2.6 x 2.2 cm in size CT chest on 12/10-suspicious for indolent malignancy-likely adenocarcinoma Given dementia/delirium-advanced age-do not think getting a tissue diagnosis-is going to change the outcome of management.  Do not think he will be a candidate for chemo/radiation. Suspect best option is to hospice  BPH Continue Flomax/finasteride Frequent bladder scans to ensure no retention.  Dementia Has been trialed on Aricept several years ago and had side effects-has been maintained on Namenda for the past several years Per daughter-this apparently was done for "short-term memory issues"-but suspect he has significant amount of dementia at baseline.  Tried to explain to patient's daughter that at times when patients are at the familiar surroundings-it could mask the severity of her underlying dementia.  Severe failure to thrive syndrome Underlying dementia-delirium Continue to work with PT/OT/SLP Seems to be much more awake/alert this morning-did eat quite a bit of his breakfast compared to yesterday. Daughter [name]-worried about nutrition status-explained that cortrak tube placement will be quite challenging given delirium-he will remain at significant risk of it being pulled out.  Thankfully he seems to have eaten quite a bit today-suspect we can hold off placing it for now-await further goals of care discussion with the palliative care team. As outlined above-suspect best option is going home with hospice care.  Suspect that further investigation/treatment may cause more harm with side effects than change the outcome here.   Infrarenal abdominal aortic aneurysm Supportive care Doubt any benefit from surveillance in the outpatient at this point given advanced age.   Nutrition Status: Nutrition Problem: Inadequate oral intake Etiology: decreased appetite Signs/Symptoms: meal completion < 25% Interventions: Refer to RD note for  recommendations  Underweight: Estimated body mass index is 19.01 kg/m as calculated from the following:   Height as of this encounter: 5\' 9"  (1.753 m).   Weight as of this encounter: 58.4 kg.   Code status:   Code Status: DNR   DVT Prophylaxis: heparin injection 5,000 Units Start: 06/22/22 0600 SCDs Start: 06/22/22 6834    Family Communication: Daugther [name] over the phone-[phone]  Disposition Plan: Status is: Inpatient Remains inpatient appropriate because: Severe encephalopathy-workup in progress.   Planned Discharge Destination:Skilled nursing facility versus home health.   Diet: Diet Order             DIET DYS 3 Room service appropriate? No; Fluid consistency: Thin  Diet effective now                     Antimicrobial agents: Anti-infectives (From admission, onward)    Start     Dose/Rate Route Frequency Ordered Stop   06/24/22 1345  vancomycin (VANCOREADY) IVPB 1250 mg/250 mL  Status:  Discontinued       See Hyperspace for full Linked Orders Report.   1,250 mg 166.7 mL/hr over 90 Minutes Intravenous Every 24 hours 06/23/22 1248 06/23/22 1250   06/24/22 1345  vancomycin (VANCOREADY) IVPB 1250 mg/250 mL  Status:  Discontinued       See Hyperspace for full Linked Orders Report.   1,250 mg 166.7 mL/hr over 90 Minutes Intravenous Every 24 hours 06/23/22 1250 06/23/22 1251   06/24/22 1345  vancomycin (VANCOREADY)  IVPB 750 mg/150 mL  Status:  Discontinued       See Hyperspace for full Linked Orders Report.   750 mg 150 mL/hr over 60 Minutes Intravenous Every 24 hours 06/23/22 1251 06/23/22 1653   06/24/22 1200  cefTRIAXone (ROCEPHIN) 2 g in sodium chloride 0.9 % 100 mL IVPB  Status:  Discontinued        2 g 200 mL/hr over 30 Minutes Intravenous Every 24 hours 06/23/22 1653 06/25/22 0929   06/23/22 1345  vancomycin (VANCOREADY) IVPB 1250 mg/250 mL  Status:  Discontinued       See Hyperspace for full Linked Orders Report.   1,250 mg 166.7 mL/hr over 90  Minutes Intravenous  Once 06/23/22 1248 06/23/22 1250   06/23/22 1345  vancomycin (VANCOREADY) IVPB 750 mg/150 mL  Status:  Discontinued       See Hyperspace for full Linked Orders Report.   750 mg 150 mL/hr over 60 Minutes Intravenous  Once 06/23/22 1250 06/23/22 1251   06/23/22 1345  vancomycin (VANCOREADY) IVPB 1250 mg/250 mL       See Hyperspace for full Linked Orders Report.   1,250 mg 166.7 mL/hr over 90 Minutes Intravenous  Once 06/23/22 1251 06/23/22 1630        MEDICATIONS: Scheduled Meds:  aspirin EC  81 mg Oral Q breakfast   budesonide  3 mg Oral Daily   enalapril  10 mg Oral Daily   feeding supplement  237 mL Oral TID PC   finasteride  5 mg Oral Daily   heparin  5,000 Units Subcutaneous Q8H   insulin aspart  0-15 Units Subcutaneous TID WC   memantine  10 mg Oral BID   metoprolol tartrate  5 mg Intravenous Q8H   mirabegron ER  25 mg Oral Daily   multivitamin with minerals  1 tablet Oral Daily   pravastatin  80 mg Oral Daily   QUEtiapine  25 mg Oral q morning   QUEtiapine  50 mg Oral QHS   tamsulosin  0.4 mg Oral QPC supper   Continuous Infusions:   PRN Meds:.acetaminophen **OR** acetaminophen, haloperidol lactate, hydrALAZINE, labetalol, LORazepam, ondansetron **OR** ondansetron (ZOFRAN) IV, mouth rinse, oxyCODONE   I have personally reviewed following labs and imaging studies  LABORATORY DATA: CBC: Recent Labs  Lab 06/20/22 1108 06/20/22 1125 06/22/22 0008 06/22/22 0521 06/23/22 0457 06/24/22 0855  WBC 6.5  --  6.2 6.7 6.9 3.9*  NEUTROABS 4.5  --  4.4 4.8  --  2.5  HGB 12.0* 11.9* 10.3* 10.3* 11.2* 10.0*  HCT 37.5* 35.0* 32.1* 32.5* 35.8* 31.4*  MCV 92.1  --  92.5 92.1 93.2 91.5  PLT 366  --  319 292 346 289     Basic Metabolic Panel: Recent Labs  Lab 06/22/22 0521 06/22/22 0820 06/23/22 0457 06/24/22 0855 06/26/22 0423  NA 136 138 144 141 139  K 5.7* 4.2 5.3* 4.0 3.9  CL 105 106 110 110 107  CO2 23 27 25 22  21*  GLUCOSE 167* 92 141*  96 97  BUN 32* 31* 27* 27* 23  CREATININE 1.17 1.06 1.10 1.12 1.06  CALCIUM 8.3* 8.6* 8.8* 8.3* 8.2*  MG 2.5*  --   --   --   --      GFR: Estimated Creatinine Clearance: 32.9 mL/min (by C-G formula based on SCr of 1.06 mg/dL).  Liver Function Tests: Recent Labs  Lab 06/20/22 1108 06/22/22 0521 06/23/22 0457  AST 21 44* 27  ALT 21 22 20  ALKPHOS 103 90 97  BILITOT 0.5 1.2 0.4  PROT 6.8 5.9* 6.1*  ALBUMIN 3.4* 2.9* 3.1*    No results for input(s): "LIPASE", "AMYLASE" in the last 168 hours. Recent Labs  Lab 06/20/22 1407 06/24/22 0855  AMMONIA 15 13     Coagulation Profile: Recent Labs  Lab 06/20/22 1108  INR 1.1     Cardiac Enzymes: Recent Labs  Lab 06/23/22 0457  CKTOTAL 164     BNP (last 3 results) No results for input(s): "PROBNP" in the last 8760 hours.  Lipid Profile: No results for input(s): "CHOL", "HDL", "LDLCALC", "TRIG", "CHOLHDL", "LDLDIRECT" in the last 72 hours.  Thyroid Function Tests: No results for input(s): "TSH", "T4TOTAL", "FREET4", "T3FREE", "THYROIDAB" in the last 72 hours.  Anemia Panel: No results for input(s): "VITAMINB12", "FOLATE", "FERRITIN", "TIBC", "IRON", "RETICCTPCT" in the last 72 hours.   Urine analysis:    Component Value Date/Time   COLORURINE STRAW (A) 06/22/2022 0509   APPEARANCEUR CLEAR 06/22/2022 0509   APPEARANCEUR Clear 03/17/2022 1240   LABSPEC 1.008 06/22/2022 0509   PHURINE 7.0 06/22/2022 0509   GLUCOSEU NEGATIVE 06/22/2022 0509   HGBUR NEGATIVE 06/22/2022 0509   BILIRUBINUR NEGATIVE 06/22/2022 0509   BILIRUBINUR negative 04/13/2022 1525   BILIRUBINUR Negative 03/17/2022 1240   KETONESUR NEGATIVE 06/22/2022 0509   PROTEINUR NEGATIVE 06/22/2022 0509   UROBILINOGEN 1.0 04/13/2022 1525   NITRITE NEGATIVE 06/22/2022 0509   LEUKOCYTESUR NEGATIVE 06/22/2022 0509    Sepsis Labs: Lactic Acid, Venous No results found for: "LATICACIDVEN"  MICROBIOLOGY: Recent Results (from the past 240 hour(s))   Culture, blood (Routine X 2) w Reflex to ID Panel     Status: Abnormal   Collection Time: 06/22/22  5:03 PM   Specimen: Left Antecubital; Blood  Result Value Ref Range Status   Specimen Description   Final    LEFT ANTECUBITAL Performed at [facility], [address], [place], [place] [address]    Special Requests   Final    BOTTLES DRAWN AEROBIC AND ANAEROBIC Blood Culture adequate volume Performed at [facility], [address], [place], [place] [address]    Culture  Setup Time   Final    GRAM POSITIVE COCCI IN BOTH AEROBIC AND ANAEROBIC BOTTLES Gram Stain Report Called to,Read Back By and Verified With: COLEMAN @ [place] ON [id number] BY HENDERSON L CRITICAL RESULT CALLED TO, READ BACK BY AND VERIFIED WITH: [place] ON 06/23/22 @ 1619 BY DRT    Culture (A)  Final    STAPHYLOCOCCUS WARNERI THE SIGNIFICANCE OF ISOLATING THIS ORGANISM FROM A SINGLE SET OF BLOOD CULTURES WHEN MULTIPLE SETS ARE DRAWN IS UNCERTAIN. PLEASE NOTIFY THE MICROBIOLOGY DEPARTMENT WITHIN ONE WEEK IF SPECIATION AND SENSITIVITIES ARE REQUIRED. Performed at [facility], [place] [address] [place], [place] [address]    Report Status 06/25/2022 FINAL  Final  Culture, blood (Routine X 2) w Reflex to ID Panel     Status: None (Preliminary result)   Collection Time: 06/22/22  5:03 PM   Specimen: Right Antecubital; Blood  Result Value Ref Range Status   Specimen Description RIGHT ANTECUBITAL  Final   Special Requests   Final    BOTTLES DRAWN AEROBIC AND ANAEROBIC Blood Culture adequate volume   Culture   Final    NO GROWTH 4 DAYS Performed at [facility], [address], [place], [place] [address]    Report Status PENDING  Incomplete  Blood Culture ID Panel (Reflexed)     Status: Abnormal  Collection Time: 06/22/22  5:03 PM  Result Value Ref Range Status   Enterococcus faecalis NOT DETECTED NOT DETECTED Final   Enterococcus Faecium NOT DETECTED NOT DETECTED Final   Listeria monocytogenes NOT DETECTED NOT  DETECTED Final   Staphylococcus species DETECTED (A) NOT DETECTED Final    Comment: CRITICAL RESULT CALLED TO, READ BACK BY AND VERIFIED WITH: PHARMD LAURIE POOLE ON 06/23/22 @ 1619 BY DRT    Staphylococcus aureus (BCID) NOT DETECTED NOT DETECTED Final   Staphylococcus epidermidis NOT DETECTED NOT DETECTED Final   Staphylococcus lugdunensis NOT DETECTED NOT DETECTED Final   Streptococcus species NOT DETECTED NOT DETECTED Final   Streptococcus agalactiae NOT DETECTED NOT DETECTED Final   Streptococcus pneumoniae NOT DETECTED NOT DETECTED Final   Streptococcus pyogenes NOT DETECTED NOT DETECTED Final   A.calcoaceticus-baumannii NOT DETECTED NOT DETECTED Final   Bacteroides fragilis NOT DETECTED NOT DETECTED Final   Enterobacterales NOT DETECTED NOT DETECTED Final   Enterobacter cloacae complex NOT DETECTED NOT DETECTED Final   Escherichia coli NOT DETECTED NOT DETECTED Final   Klebsiella aerogenes NOT DETECTED NOT DETECTED Final   Klebsiella oxytoca NOT DETECTED NOT DETECTED Final   Klebsiella pneumoniae NOT DETECTED NOT DETECTED Final   Proteus species NOT DETECTED NOT DETECTED Final   Salmonella species NOT DETECTED NOT DETECTED Final   Serratia marcescens NOT DETECTED NOT DETECTED Final   Haemophilus influenzae NOT DETECTED NOT DETECTED Final   Neisseria meningitidis NOT DETECTED NOT DETECTED Final   Pseudomonas aeruginosa NOT DETECTED NOT DETECTED Final   Stenotrophomonas maltophilia NOT DETECTED NOT DETECTED Final   Candida albicans NOT DETECTED NOT DETECTED Final   Candida auris NOT DETECTED NOT DETECTED Final   Candida glabrata NOT DETECTED NOT DETECTED Final   Candida krusei NOT DETECTED NOT DETECTED Final   Candida parapsilosis NOT DETECTED NOT DETECTED Final   Candida tropicalis NOT DETECTED NOT DETECTED Final   Cryptococcus neoformans/gattii NOT DETECTED NOT DETECTED Final    Comment: Performed at [facility], [address] [place], [place] [address]  MRSA Next Gen  by PCR, Nasal     Status: None   Collection Time: 06/23/22  5:27 PM   Specimen: Nasal Mucosa; Nasal Swab  Result Value Ref Range Status   MRSA by PCR Next Gen NOT DETECTED NOT DETECTED Final    Comment: (NOTE) The GeneXpert MRSA Assay (FDA approved for NASAL specimens only), is one component of a comprehensive MRSA colonization surveillance program. It is not intended to diagnose MRSA infection nor to guide or monitor treatment for MRSA infections. Test performance is not FDA approved in patients less than 42 years old. Performed at [facility], [address], [place], [place] [address]   Culture, blood (Routine X 2) w Reflex to ID Panel     Status: None (Preliminary result)   Collection Time: 06/24/22  8:55 AM   Specimen: Right Antecubital  Result Value Ref Range Status   Specimen Description   Final    RIGHT ANTECUBITAL BOTTLES DRAWN AEROBIC AND ANAEROBIC   Special Requests   Final    Blood Culture results may not be optimal due to an excessive volume of blood received in culture bottles   Culture   Final    NO GROWTH 2 DAYS Performed at [facility], [address] [place], [place] [address]    Report Status PENDING  Incomplete  Culture, blood (Routine X 2) w Reflex to ID Panel     Status: None (Preliminary result)  Collection Time: 06/24/22  8:55 AM   Specimen: BLOOD RIGHT HAND  Result Value Ref Range Status   Specimen Description   Final    BLOOD RIGHT HAND BOTTLES DRAWN AEROBIC AND ANAEROBIC   Special Requests   Final    Blood Culture results may not be optimal due to an excessive volume of blood received in culture bottles   Culture   Final    NO GROWTH 2 DAYS Performed at [facility], [address], [place], [place] [address]    Report Status PENDING  Incomplete    RADIOLOGY STUDIES/RESULTS: Overnight EEG with video  Result Date: 06/26/2022 Samuella Cota, MD     06/26/2022  7:58 AM EEG Procedure CPT/Type of Study: [address]; 2-12hr EEG with video Referring Provider:  Tawni Melkonian Primary Neurological Diagnosis: AMS History: This is a 88 yr old patient, undergoing an EEG to evaluate for AMS. Clinical State: disoriented Technical Description: The EEG was performed using standard setting per the guidelines of American Clinical Neurophysiology Society (ACNS). A minimum of 21 electrodes were placed on scalp according to the International 10-20 or/and 10-10 Systems. Supplemental electrodes were placed as needed. Single EKG electrode was also used to detect cardiac arrhythmia. Patient's behavior was continuously recorded on video simultaneously with EEG. A minimum of 16 channels were used for data display. Each epoch of study was reviewed manually daily and as needed using standard referential and bipolar montages. Computerized quantitative EEG analysis (such as compressed spectral array analysis, trending, automated spike & seizure detection) were used as indicated. Day 1: from 1324 06/25/22 to 1856 06/26/22 EEG Description: Overall Amplitude:Normal Predominant Frequency: The background activity showed theta/delta slowing occurring frequently Superimposed Frequencies: sparse beta activity bilaterally The background was symmetric Background Abnormalities: Generalized slowing as above Rhythmic or periodic pattern: No Epileptiform activity: no Electrographic seizures: no Events: no Breach rhythm: no Reactivity: Present Stimulation procedures: Hyperventilation: not done Photic stimulation: not done Sleep Background: Stage I EKG:no significant arrhythmia Impression: This was an abnormal continuous EEG due to diffuse background slowing, indicative of a non-specific encephalopathy pattern. No seizures or epileptiform discharges were seen.   CT CHEST ABDOMEN PELVIS W CONTRAST  Result Date: 06/25/2022 CLINICAL DATA:  Lung mass identified by CTA head and neck, evaluate for malignancy * Tracking Code: BO * EXAM: CT CHEST, ABDOMEN, AND PELVIS WITH CONTRAST TECHNIQUE: Multidetector CT imaging of  the chest, abdomen and pelvis was performed following the standard protocol during bolus administration of intravenous contrast. RADIATION DOSE REDUCTION: This exam was performed according to the departmental dose-optimization program which includes automated exposure control, adjustment of the mA and/or kV according to patient size and/or use of iterative reconstruction technique. CONTRAST:  28mL OMNIPAQUE IOHEXOL 350 MG/ML SOLN COMPARISON:  CT chest angiogram, 06/13/2016, CT abdomen pelvis, 06/17/2016 FINDINGS: CT CHEST FINDINGS Cardiovascular: Aortic atherosclerosis. Aortic valve calcifications. Normal heart size. Three-vessel coronary artery calcifications. No pericardial effusion. Mediastinum/Nodes: No enlarged mediastinal, hilar, or axillary lymph nodes. Thyroid gland, trachea, and esophagus demonstrate no significant findings. Lungs/Pleura: In comparison to most recent examination of the chest dated 06/13/2016, there has been significant interval increase in size and solid character of a previously seen subsolid opacity of the peripheral left upper lobe, now measuring 2.6 x 2.2 cm, previously 2.1 x 1.4 cm (series 5, image 37). Mild underlying centrilobular emphysema. Diffuse bilateral bronchial wall thickening. Small bilateral pleural effusions. No pleural effusion or pneumothorax. Musculoskeletal: No chest wall abnormality. No acute osseous findings. CT ABDOMEN PELVIS FINDINGS Hepatobiliary: No solid liver abnormality  is seen. No gallstones, gallbladder wall thickening, or biliary dilatation. Pancreas: Unremarkable. No pancreatic ductal dilatation or surrounding inflammatory changes. Spleen: Normal in size without significant abnormality. Adrenals/Urinary Tract: Adrenal glands are unremarkable. Kidneys are normal, without renal calculi, solid lesion, or hydronephrosis. Bladder is unremarkable. Stomach/Bowel: Stomach is within normal limits. Diverticulum of the descending duodenum. Appendix appears normal. No  evidence of bowel wall thickening, distention, or inflammatory changes. Pancolonic diverticulosis. Vascular/Lymphatic: Aortic atherosclerosis. Interval enlargement of an aneurysm of the infrarenal abdominal aorta measuring up to 4.4 x 3.8 cm, previously 3.8 x 3.7 cm (series 3, image 68). No enlarged abdominal or pelvic lymph nodes. Reproductive: Prostatomegaly. Other: Small, fat containing left inguinal hernia.  No ascites. Musculoskeletal: No acute osseous findings. IMPRESSION: 1. In comparison to most recent examination of the chest dated 06/13/2016, there has been significant interval increase in size and solid character of a previously seen subsolid opacity of the peripheral left upper lobe, now measuring 2.6 x 2.2 cm, previously 2.1 x 1.4 cm. This is highly concerning for indolent adenocarcinoma. PET-CT characterization and tissue sampling can be considered if clinically appropriate given advanced patient age. 2. No evidence of lymphadenopathy or metastatic disease in the chest, abdomen, or pelvis. 3. Mild emphysema and diffuse bilateral bronchial wall thickening. 4. Small bilateral pleural effusions. 5. Interval enlargement of an aneurysm of the infrarenal abdominal aorta measuring up to 4.4 x 3.8 cm, previously 3.8 x 3.7 cm. Recommend follow-up every 12 months and vascular consultation if clinically appropriate. This recommendation follows ACR consensus guidelines: White Paper of the ACR Incidental Findings Committee II on Vascular Findings. J Am Coll Radiol 2013; 10:789-794. 6. Prostatomegaly. 7. Coronary artery disease. Aortic Atherosclerosis (ICD10-I70.0) and Emphysema (ICD10-J43.9). Electronically Signed   By: Delanna Ahmadi M.D.   On: 06/25/2022 16:44     LOS: 3 days   Oren Binet, MD  [facility]    To contact the attending provider between 7A-7P or the covering provider during after hours 7P-7A, please log into the web site [email or web address] and access using universal [place]  password for that web site. If you do not have the password, please call the hospital operator.  06/26/2022, 11:01 AM

## 2022-06-26 NOTE — Evaluation (Signed)
Occupational Therapy Evaluation Patient Details Name: Jordan [name] MRN: [id number] DOB: [birth year] Today's Date: 06/26/2022   History of Present Illness 88 yo male presents to [facility] on 12/5 with AMS, neuro and UA clear so d/c. Pt back to ED on 12/7 with continued AMS with combativeness. PMH includes HTN, DMII, dementia, HLD,  aortic stenosis, BPH, CAD, macular degeneration and is legally blind.   Clinical Impression   Pt admitted with the above diagnoses and presents with below problem list. Pt will benefit from continued acute OT to address the below listed deficits and maximize independence with basic ADLs prior to d/c to venue below. PTA, per chart review pt was living alone with PRN assist from family. Pt currently needs max-total assist with UB ADLs, max-total assist +2 for LB ADLs and functional transfers. Zero standing balance, impulsive, and posterior lean requiring min to mod A in static sitting. High fall risk.       Recommendations for follow up therapy are one component of a multi-disciplinary discharge planning process, led by the attending physician.  Recommendations may be updated based on patient status, additional functional criteria and insurance authorization.   Follow Up Recommendations  Skilled nursing-short term rehab (<3 hours/day)     Assistance Recommended at Discharge Frequent or constant Supervision/Assistance  Patient can return home with the following Two people to help with walking and/or transfers;Two people to help with bathing/dressing/bathroom;Assistance with cooking/housework;Assistance with feeding;Direct supervision/assist for medications management;Direct supervision/assist for financial management;Assist for transportation;Help with stairs or ramp for entrance    Functional Status Assessment  Patient has had a recent decline in their functional status and demonstrates the ability to make significant improvements in function in a reasonable and  predictable amount of time.  Equipment Recommendations  Other (comment) (defer to next venue)    Recommendations for Other Services       Precautions / Restrictions Precautions Precautions: Fall Restrictions Weight Bearing Restrictions: No      Mobility Bed Mobility Overal bed mobility: Needs Assistance Bed Mobility: Supine to Sit, Sit to Supine     Supine to sit: Mod assist, HOB elevated Sit to supine: Max assist, +2 for physical assistance   General bed mobility comments: mod assist for supine>sit for trunk elevation via HHA and scooting to EOB, max +2 for return to supine for LE and truncal management, pt able to perform partial bridge in supine to assist with boost up in bed.    Transfers Overall transfer level: Needs assistance Equipment used: Rolling walker (2 wheels), 2 person hand held assist Transfers: Sit to/from Stand, Bed to chair/wheelchair/BSC Sit to Stand: Mod assist, +2 physical assistance Stand pivot transfers: Max assist, +2 safety/equipment         General transfer comment: mod +2 for power up, rise, steady, feet blocking as pt tends to slide feet anteriorly, and correct heavy posterior bias. STS x4, from EOB x2 with RW, EOB x1 with +2, and x1 from [facility].      Balance Overall balance assessment: Needs assistance, History of Falls Sitting-balance support: Bilateral upper extremity supported, Feet supported Sitting balance-Leahy Scale: Poor Sitting balance - Comments: posterior bias requiring assist to correct Postural control: Posterior lean Standing balance support: Bilateral upper extremity supported, During functional activity Standing balance-Leahy Scale: Zero Standing balance comment: max assist +2                           ADL either performed or assessed with clinical  judgement   ADL Overall ADL's : Needs assistance/impaired Eating/Feeding: Total assistance;Maximal assistance;Bed level   Grooming: Maximal assistance;Bed level    Upper Body Bathing: Total assistance;Bed level;Sitting;Maximal assistance   Lower Body Bathing: Maximal assistance;+2 for safety/equipment;+2 for physical assistance;Sit to/from stand   Upper Body Dressing : Maximal assistance;Total assistance;Sitting   Lower Body Dressing: Maximal assistance;+2 for physical assistance;+2 for safety/equipment;Sit to/from stand;Total assistance   Toilet Transfer: Maximal assistance;+2 for safety/equipment;+2 for physical assistance;Stand-pivot;BSC/3in1 Toilet Transfer Details (indicate cue type and reason): initially used rw then switched to +2 HHA for safety Toileting- Clothing Manipulation and Hygiene: Maximal assistance;Total assistance;+2 for physical assistance;+2 for safety/equipment;Sit to/from stand               Vision         Perception     Praxis      Pertinent Vitals/Pain Pain Assessment Pain Assessment: Faces Pain Score: 0-No pain Faces Pain Scale: No hurt Pain Intervention(s): Monitored during session     Hand Dominance Right   Extremity/Trunk Assessment Upper Extremity Assessment Upper Extremity Assessment: Generalized weakness;Difficult to assess due to impaired cognition   Lower Extremity Assessment Lower Extremity Assessment: Defer to PT evaluation   Cervical / Trunk Assessment Cervical / Trunk Assessment: Kyphotic   Communication Communication Communication: HOH   Cognition Arousal/Alertness: Awake/alert Behavior During Therapy: Impulsive Overall Cognitive Status: History of cognitive impairments - at baseline                                 General Comments: history of dementia, oriented to self and being in a hospital but not why. Pt tries to stand multiple times without staff assist and is VERY high fall risk, pt with no awareness of this     General Comments  pt with abrasions, skin tears, and bruises all over, most notably bruising to L cheek, skin tears on bilat UEs and LEs     Exercises     Shoulder Instructions      Home Living Family/patient expects to be discharged to:: Private residence Living Arrangements: Alone Available Help at Discharge: Family;Available PRN/intermittently Type of Home: House                       Home Equipment: Rolling Walker (2 wheels)          Prior Functioning/Environment Prior Level of Function : Needs assist;Patient poor historian/Family not available;History of Falls (last six months)             Mobility Comments: pt reports he walks with a RW, unsure of true baseline and pt cannot state. Pt states he has had "one or two" falls ADLs Comments: unsure of assist he needs from family, per chart review pt is independent in ADLs        OT Problem List:        OT Treatment/Interventions:      OT Goals(Current goals can be found in the care plan section) Acute Rehab OT Goals Patient Stated Goal: did not state. OT Goal Formulation: With patient Time For Goal Achievement: 07/10/22 Potential to Achieve Goals: Fair ADL Goals Pt Will Perform Eating: with min assist;bed level Pt Will Transfer to Toilet: with max assist;stand pivot transfer;bedside commode Additional ADL Goal #1: Pt will complete bed mobility at mod A level to prepare for EOB/OOB ADLs. Additional ADL Goal #2: Pt will sit statically for 1 minute at min guard  level to faciliate EOB ADLs.  OT Frequency:      Co-evaluation PT/OT/SLP Co-Evaluation/Treatment: Yes Reason for Co-Treatment: To address functional/ADL transfers;For patient/therapist safety;Necessary to address cognition/behavior during functional activity   OT goals addressed during session: ADL's and self-care;Proper use of Adaptive equipment and DME      AM-PAC OT "6 Clicks" Daily Activity     Outcome Measure                 End of Session Equipment Utilized During Treatment: Gait belt;Rolling walker (2 wheels);Other (comment) (initially rw then switched to +2 HHA) Nurse  Communication: Mobility status  Activity Tolerance: Patient limited by fatigue Patient left: in bed;with call bell/phone within reach;with bed alarm set  OT Visit Diagnosis: Unsteadiness on feet (R26.81);Muscle weakness (generalized) (M62.81);Adult, failure to thrive (R62.7);Other symptoms and signs involving cognitive function;History of falling (Z91.81);Other abnormalities of gait and mobility (R26.89)                Time: 2811-8867 OT Time Calculation (min): 19 min Charges:  OT General Charges $OT Visit: 1 Visit OT Evaluation $OT Eval Moderate Complexity: [place], OT Acute Rehabilitation Services Office: [phone]   Hortencia Pilar 06/26/2022, 12:11 PM

## 2022-06-26 NOTE — Progress Notes (Addendum)
Neurology Progress Note  Brief HPI: 88 year old patient with history of memory impairment, hypertension, hyperlipidemia, stent and diabetes initially presented with behavioral disturbance, confusion and left facial droop.  According to patient's daughter, he was alert and oriented several days prior to admission and was discussing the stock market.  He is normally able to perform his own ADLs.  He has been afebrile, no leukocytosis, no headache, no nuchal rigidity. No seizures on EEG.  Blood cultures negitive (initial result was likely contaminant with staphylococcus). Do not feel that CNS infection is cause. CT c/a/p shows lung mass and lymphedema, which has increased in size from prior and concerning for an indolent adenocarcinoma. Paraneoplastic process is certainly on the differential but given his advanced age and fraility, will need to clarify goals of care with daughter before further workup.  Subjective: Very sleepy on exam, noted that he just got Seroquel 25 mg for agitation is able to state his name but believes that he is at home. He is cooperative with exam and can only follow simple commands with much encouragement to stay awake. [place] meeting planned for tomorrow with family.   Exam: Vitals:   06/26/22 0455 06/26/22 0743  BP: (!) 160/99 111/80  Pulse: 77 74  Resp: 16 18  Temp: (!) 97.5 F (36.4 C) 98.4 F (36.9 C)  SpO2: 98% 98%   Gen: In bed, NAD Resp: non-labored breathing, no acute distress  Neuro: Mental Status: Sleepy after Seroquel given, will awaken and open eyes, poorly attends. Oriented to self only, gave his DOB, but not age or other details. Able to follow simple but not two-step commands with much encouragement to stay awake. Speech is hypophonic and sparse, difficult to fully test as he cannot stay awake to name objects.  Cranial Nerves: Pupils equal round and reactive, face symmetrical, very HOH. Face appears symmetric.  Motor: Able to move all extremities to  command; diffusely weak, non focal Sensory: Appears intact to light touch throughout DTR: Unable to elicit Gait: Deferred  Pertinent Labs:    Latest Ref Rng & Units 06/24/2022    8:55 AM 06/23/2022    4:57 AM 06/22/2022    5:21 AM  CBC  WBC 4.0 - 10.5 K/uL 3.9  6.9  6.7   Hemoglobin 13.0 - 17.0 g/dL 10.0  11.2  10.3   Hematocrit 39.0 - 52.0 % 31.4  35.8  32.5   Platelets 150 - 400 K/uL 289  346  292        Latest Ref Rng & Units 06/26/2022    4:23 AM 06/24/2022    8:55 AM 06/23/2022    4:57 AM  BMP  Glucose 70 - 99 mg/dL 97  96  141   BUN 8 - 23 mg/dL 23  27  27    Creatinine 0.61 - 1.24 mg/dL 1.06  1.12  1.10   Sodium 135 - 145 mmol/L 139  141  144   Potassium 3.5 - 5.1 mmol/L 3.9  4.0  5.3   Chloride 98 - 111 mmol/L 107  110  110   CO2 22 - 32 mmol/L 21  22  25    Calcium 8.9 - 10.3 mg/dL 8.2  8.3  8.8    RPR: Negative TSH: 1.357 Blood culture: Positive for Staphylococcus species, likely contaminant B12: 398 Ammonia: 13 Thiamine not performed as supplementation already started  Imaging Reviewed:  CT head: No acute abnormality, atrophy and chronic microvascular ischemic disease  Assessment: 88 year old patient who is normally alert and oriented  and performs his own ADLs with history of hypertension, hyperlipidemia, CAD and diabetes presents with a few days of confusion.  According to his daughter, he is normally alert and oriented and able to discuss the stock market, but he has had memory issues for several years and is on memantine.  - On exam today, he is currently alert and oriented x 1 only.  - Recent administration of benzodiazepines and opiates may have contributed to patient's delirium.   - There is a questions of a lung mass, and CT CAP will be performed to investigate for malignancy.   - Given the possible lung malignancy, there is a question of a paraneoplastic process causing altered mental status. - EEG did  not show seizures; generalized slowing only.    Impression: Altered mental status due to delirium versus paraneoplastic encephalopathy due to possible underlying malignancy  Recommendations: -GOC needed prior to further paraneoplastic wk up -Avoid opiates and benzodiazepines as these are deliriogenic, would treat agitation with low-dose antipsychotic -Sleep hygine -DC EEG   Desiree Metzger-Cihelka, ARNP-C, ANVP-BC [facility] See Amion for schedule and pager information 06/26/2022 8:17 AM  Electronically signed: Dr. Kerney Elbe

## 2022-06-26 NOTE — Plan of Care (Signed)
Patient had to be fed this morning. He ate 1 cup of applesauce, 1 bowl of cream of wheat and two small bites of sausage with 1/4th of an ensure.

## 2022-06-26 NOTE — Care Management Important Message (Signed)
Important Message  Patient Details  Name: Jordan [name] MRN: [id number] Date of Birth: Dec 29, 1922   Medicare Important Message Given:  Yes     Evamaria Detore Montine Circle 06/26/2022, 3:32 PM

## 2022-06-26 NOTE — Plan of Care (Signed)
  Problem: Coping: Goal: Ability to adjust to condition or change in health will improve Outcome: Not Progressing   Problem: Fluid Volume: Goal: Ability to maintain a balanced intake and output will improve Outcome: Not Progressing

## 2022-06-26 NOTE — Progress Notes (Signed)
Patient ID: Jordan [name], male   DOB: 1923/01/13, 88 y.o.   MRN: [id number]    Progress Note from the Palliative Medicine Team at [facility]   Patient Name: Jordan [name]        Date: 06/26/2022 DOB: [birth year]  Age: 88 y.o. MRN#: [id number] Attending Physician: Jonetta Osgood, MD Primary Care Physician: Asencion Noble, MD Admit Date: 06/21/2022   Medical records reviewed, discussed with Dr Sloan Leiter     88 y.o.  male with history of aortic stenosis (not TAVR candidate) dementia, HTN, BPH, DM-2, HLD, lymphocytic colitis-who presented to altered mental status.  Patient was transferred to [facility] for LTM EEG/LP/neurology evaluation.     Significant events: 12/05>> [facility] ED visit for weakness/falls/confusion.  Discharged home. 12/07>> admit to [facility] for worsening confusion 12/10>> transferred to [facility] for neurology/LTM EEG/possible LP-given ongoing confusion.   Significant studies: 12/05>> CT head: No acute intracranial abnormality 12/05>> CTA head/neck: No LVO, 75% stenosis of right ICA at the origin. 12/05>> MRI brain: No acute/subacute CVA show 12/05>> EEG: No seizures.   12/07>> CT head: No acute intracranial abnormality. 12/07>> TSH: 1.35 12/08>> EEG: No seizures. 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   Significant microbiology data: 12/07>> blood culture:Staph warneri (likely contamination) 12/09>> blood culture: No growth   This NP assessed patient at the bedside as a follow up for palliative medicine needs and emotional support. Patient is difficult to arouse.  He appears comfortable. Daughter/ [name] at bedside  Created space and opportunity for daughter to explore her thoughts and feeling regarding her father's current medical situation. She is very surprised at the dramatic change; two weeks ago he was  living along with a lot of support from her, ambulating and getting out of the house for appointments.  He had short term memory loss but maintained high quality of care and life skills. She reports significant weight loss over the past year.  She understands "he is aging" and is naturally declining. She knows he would not want aggressive life prolonging measures; ie "machines", artificial feeding.    She does however feel that her father needs a little more time to declare himself.    She is asking for a few more days of supportive care, treat the treatable, hydrate, eliminate sedating medications and see how he responds.  Discussed with Dr Rayburn Go  Education offered on hospice benefit; philosophy and eligibility.   Education offered today regarding  the importance of continued conversation with the  medical providers regarding overall plan of care and treatment options,  ensuring decisions are within the context of the patients values and GOCs.  Daughter agrees to meet again tomorrow to further discuss plan of care.  Questions and concerns addressed      50 minutes  Wadie Lessen NP  Palliative Medicine Team Team Phone # [phone] Pager [phone]

## 2022-06-26 NOTE — Procedures (Signed)
EEG Procedure CPT/Type of Study: 35465; 2-12hr EEG with video Referring Provider: Ghimire Primary Neurological Diagnosis: AMS  History: This is a 88 yr old patient, undergoing an EEG to evaluate for AMS. Clinical State: disoriented  Technical Description:  The EEG was performed using standard setting per the guidelines of American Clinical Neurophysiology Society (ACNS).  A minimum of 21 electrodes were placed on scalp according to the International 10-20 or/and 10-10 Systems. Supplemental electrodes were placed as needed. Single EKG electrode was also used to detect cardiac arrhythmia. Patient's behavior was continuously recorded on video simultaneously with EEG. A minimum of 16 channels were used for data display. Each epoch of study was reviewed manually daily and as needed using standard referential and bipolar montages. Computerized quantitative EEG analysis (such as compressed spectral array analysis, trending, automated spike & seizure detection) were used as indicated.   Day 1: from 1324 06/25/22 to 1856 06/26/22  EEG Description: Overall Amplitude:Normal Predominant Frequency: The background activity showed theta/delta slowing occurring frequently Superimposed Frequencies: sparse beta activity bilaterally The background was symmetric  Background Abnormalities: Generalized slowing as above Rhythmic or periodic pattern: No Epileptiform activity: no Electrographic seizures: no Events: no   Breach rhythm: no  Reactivity: Present  Stimulation procedures:  Hyperventilation: not done Photic stimulation: not done  Sleep Background: Stage I  EKG:no significant arrhythmia  Impression: This was an abnormal continuous EEG due to diffuse background slowing, indicative of a non-specific encephalopathy pattern. No seizures or epileptiform discharges were seen.

## 2022-06-26 NOTE — Evaluation (Signed)
Physical Therapy Evaluation Patient Details Name: Jordan [name] Danser MRN: [id number] DOB: 01/10/1923 Today's Date: 06/26/2022  History of Present Illness  88 yo male presents to [facility] on 12/5 with AMS, neuro and UA clear so d/c. Pt back to ED on 12/7 with continued AMS with combativeness. PMH includes HTN, DMII, dementia, HLD,  aortic stenosis, BPH, CAD, macular degeneration and is legally blind.  Clinical Impression   Pt presents with generalized weakness, impaired balance with heavy posterior bias, AMS with baseline of dementia, impaired activity tolerance. Pt to benefit from acute PT to address deficits. Pt requiring mod-max +2 assist for transfer-level mobility at this time and is very high fall risk. PT recommending SNF level of care post-acutely, if family decides to take pt home will need 24/7 assist and significant physical assist for all mobility. PT to progress mobility as tolerated, and will continue to follow acutely.         Recommendations for follow up therapy are one component of a multi-disciplinary discharge planning process, led by the attending physician.  Recommendations may be updated based on patient status, additional functional criteria and insurance authorization.  Follow Up Recommendations Skilled nursing-short term rehab (<3 hours/day) Can patient physically be transported by private vehicle: No    Assistance Recommended at Discharge Frequent or constant Supervision/Assistance  Patient can return home with the following  Two people to help with walking and/or transfers;Two people to help with bathing/dressing/bathroom    Equipment Recommendations None recommended by PT  Recommendations for Other Services       Functional Status Assessment Patient has had a recent decline in their functional status and/or demonstrates limited ability to make significant improvements in function in a reasonable and predictable amount of time     Precautions / Restrictions  Precautions Precautions: Fall Restrictions Weight Bearing Restrictions: No      Mobility  Bed Mobility Overal bed mobility: Needs Assistance Bed Mobility: Supine to Sit, Sit to Supine     Supine to sit: Mod assist, HOB elevated Sit to supine: Max assist, +2 for physical assistance   General bed mobility comments: mod assist for supine>sit for trunk elevation via HHA and scooting to EOB, max +2 for return to supine for LE and truncal management, pt able to perform partial bridge in supine to assist with boost up in bed.    Transfers Overall transfer level: Needs assistance Equipment used: Rolling walker (2 wheels), 2 person hand held assist Transfers: Sit to/from Stand, Bed to chair/wheelchair/BSC Sit to Stand: Mod assist, +2 physical assistance Stand pivot transfers: Max assist, +2 safety/equipment         General transfer comment: mod +2 for power up, rise, steady, feet blocking as pt tends to slide feet anteriorly, and correct heavy posterior bias. STS x4, from EOB x2 with RW, EOB x1 with +2, and x1 from [facility].    Ambulation/Gait                  Stairs            Wheelchair Mobility    Modified Rankin (Stroke Patients Only)       Balance Overall balance assessment: Needs assistance, History of Falls Sitting-balance support: Bilateral upper extremity supported, Feet supported Sitting balance-Leahy Scale: Poor Sitting balance - Comments: posterior bias requiring assist to correct   Standing balance support: Bilateral upper extremity supported, During functional activity Standing balance-Leahy Scale: Zero Standing balance comment: max assist +2  Pertinent Vitals/Pain Pain Assessment Pain Assessment: Faces Faces Pain Scale: No hurt Pain Intervention(s): Limited activity within patient's tolerance, Monitored during session, Repositioned    Home Living Family/patient expects to be discharged to:: Private  residence Living Arrangements: Alone Available Help at Discharge: Family;Available PRN/intermittently Type of Home: House           Home Equipment: Rolling Walker (2 wheels)      Prior Function Prior Level of Function : Needs assist;Patient poor historian/Family not available;History of Falls (last six months)             Mobility Comments: pt reports he walks with a RW, unsure of true baseline and pt cannot state. Pt states he has had "one or two" falls ADLs Comments: unsure of assist he needs from family, per chart review pt is independent in ADLs     Hand Dominance   Dominant Hand: Right    Extremity/Trunk Assessment   Upper Extremity Assessment Upper Extremity Assessment: Defer to OT evaluation    Lower Extremity Assessment Lower Extremity Assessment: Generalized weakness    Cervical / Trunk Assessment Cervical / Trunk Assessment: Kyphotic  Communication   Communication: HOH  Cognition Arousal/Alertness: Awake/alert Behavior During Therapy: Impulsive Overall Cognitive Status: History of cognitive impairments - at baseline                                 General Comments: history of dementia, oriented to self and being in a hospital but not why. Pt tries to stand multiple times without staff assist and is VERY high fall risk, pt with no awareness of this        General Comments General comments (skin integrity, edema, etc.): pt with abrasions, skin tears, and bruises all over, most notably bruising to L cheek, skin tears on bilat UEs and LEs    Exercises     Assessment/Plan    PT Assessment Patient needs continued PT services  PT Problem List Decreased strength;Decreased mobility;Decreased safety awareness;Decreased activity tolerance;Decreased balance;Decreased knowledge of use of DME;Pain;Decreased skin integrity       PT Treatment Interventions DME instruction;Therapeutic activities;Therapeutic exercise;Gait training;Patient/family  education;Balance training;Stair training;Functional mobility training;Neuromuscular re-education    PT Goals (Current goals can be found in the Care Plan section)  Acute Rehab PT Goals Patient Stated Goal: none stated PT Goal Formulation: With patient Time For Goal Achievement: 07/10/22 Potential to Achieve Goals: Fair    Frequency Min 2X/week     Co-evaluation PT/OT/SLP Co-Evaluation/Treatment: Yes Reason for Co-Treatment: To address functional/ADL transfers;For patient/therapist safety;Necessary to address cognition/behavior during functional activity           AM-PAC PT "6 Clicks" Mobility  Outcome Measure Help needed turning from your back to your side while in a flat bed without using bedrails?: A Lot Help needed moving from lying on your back to sitting on the side of a flat bed without using bedrails?: A Lot Help needed moving to and from a bed to a chair (including a wheelchair)?: Total Help needed standing up from a chair using your arms (e.g., wheelchair or bedside chair)?: Total Help needed to walk in hospital room?: Total Help needed climbing 3-5 steps with a railing? : Total 6 Click Score: 8    End of Session Equipment Utilized During Treatment: Gait belt Activity Tolerance: Patient limited by fatigue Patient left: in bed;with call bell/phone within reach;with bed alarm set Nurse Communication: Mobility status PT Visit  Diagnosis: Other abnormalities of gait and mobility (R26.89);Muscle weakness (generalized) (M62.81);Difficulty in walking, not elsewhere classified (R26.2)    Time: 0177-9390 PT Time Calculation (min) (ACUTE ONLY): 21 min   Charges:   PT Evaluation $PT Eval Low Complexity: 1 Low         Jai Bear S, PT DPT Acute Rehabilitation Services Pager [phone]  Office [phone]    AFB E Ruffin Pyo 06/26/2022, 10:43 AM

## 2022-06-26 NOTE — Plan of Care (Signed)

## 2022-06-27 DIAGNOSIS — R4182 Altered mental status, unspecified: Secondary | ICD-10-CM | POA: Diagnosis not present

## 2022-06-27 DIAGNOSIS — G9341 Metabolic encephalopathy: Secondary | ICD-10-CM | POA: Diagnosis not present

## 2022-06-27 LAB — GLUCOSE, CAPILLARY
Glucose-Capillary: 91 mg/dL (ref 70–99)
Glucose-Capillary: 173 mg/dL — ABNORMAL HIGH (ref 70–99)
Glucose-Capillary: 148 mg/dL — ABNORMAL HIGH (ref 70–99)
Glucose-Capillary: 188 mg/dL — ABNORMAL HIGH (ref 70–99)

## 2022-06-27 LAB — CULTURE, BLOOD (ROUTINE X 2)
Culture: NO GROWTH
Special Requests: ADEQUATE

## 2022-06-27 MED ORDER — SODIUM CHLORIDE 0.9 % IV SOLN: Freq: Every day | INTRAVENOUS | Status: AC | PRN

## 2022-06-27 MED ORDER — QUETIAPINE FUMARATE 25 MG PO TABS
75.0000 mg | ORAL_TABLET | Freq: Every day | ORAL | Status: DC
  Administered 2022-06-27 – 2022-07-05 (×9): 75 mg via ORAL
  Filled 2022-06-27 (×9): qty 3

## 2022-06-27 MED ORDER — MELATONIN 5 MG PO TABS
5.0000 mg | ORAL_TABLET | Freq: Every day | ORAL | Status: DC
  Administered 2022-06-27 – 2022-07-05 (×9): 5 mg via ORAL
  Filled 2022-06-27 (×9): qty 1

## 2022-06-27 MED ORDER — HALOPERIDOL LACTATE 5 MG/ML IJ SOLN: 2.0000 mg | Freq: Four times a day (QID) | INTRAMUSCULAR | Status: DC | PRN

## 2022-06-27 NOTE — Progress Notes (Signed)
Speech Language Pathology Treatment: Dysphagia  Patient Details Name: Jordan [name] MRN: [id number] DOB: 1923/03/14 Today's Date: 06/27/2022 Time: 6629-4765 SLP Time Calculation (min) (ACUTE ONLY): 28 min  Assessment / Plan / Recommendation Clinical Impression  Patient seen for f/u diet tolerance assessment after initial evaluation. Patient sleeping upon arrival to room but aroused easily with verbal stimulation. He was pleasant and calm throughout session, initially disoriented to place but then making appropriate comments about nursing staff, etc to demonstrate intact orientation, at least in the moment. He was able to consume dysphagia 3 solids and thin liquids via self feeding with moderate physical assistance. Mastication of solids mildly prolonged but functional. Liquids consumed via straw and frequently via multiple consecutive sips. One cough post swallow noted coinciding with belching which likely caused a backflow of bolus from the esophagus and/or slight dis coordination. Noted audible mild upper airway congestion however vocal quality remained clear throughout pos which included also meds provided via RN one at a time whole with liquid. Overall, patient appears to be tolerating current diet. Given fluctuating mentation documented and reported by nursing staff with resultant impact on po intake (decreased, requiring meds crushed, etc), SLP will continue to f/u briefly to ensure safest and more efficient diet to maximize po intake.     HPI HPI: 88 y.o. male  with past medical history of mild cognitive dysfunction with short-term memory loss, aortic stenosis not a candidate for TAVR, BPH, coronary artery disease, hypertension, DM 2, diastolic dysfunction, bilateral hearing loss, macular degeneration, admitted on 06/21/2022 with altered mental status.  He has been agitated and combative for several days.  CT scan and MRI did not show any acute findings although there is advanced cerebral  atrophy.  There was evidence of small chronic lacunar infarcts, and small left forehead hematoma.  CT angio of the head and neck showed 75% stenosis of his right ICA as well as a peripheral subsolid pulmonary opacity that has increased in size compared to 2017 and is suspicious for malignancy. No acute findings on Chest X-ray. BSE requested.      SLP Plan  Continue with current plan of care      Recommendations for follow up therapy are one component of a multi-disciplinary discharge planning process, led by the attending physician.  Recommendations may be updated based on patient status, additional functional criteria and insurance authorization.    Recommendations  Diet recommendations: Thin liquid;Dysphagia 3 (mechanical soft) Liquids provided via: Cup;Straw Medication Administration: Whole meds with liquid (or whole in puree, crushed in puree if needed) Supervision: Staff to assist with self feeding Compensations: Slow rate;Small sips/bites Postural Changes and/or Swallow Maneuvers: Seated upright 90 degrees                Oral Care Recommendations: Oral care BID Follow Up Recommendations: No SLP follow up Assistance recommended at discharge: Frequent or constant Supervision/Assistance Plan: Continue with current plan of care         [facility] MA, [place]  06/27/2022, 11:07 AM

## 2022-06-27 NOTE — Progress Notes (Signed)
Pt awake and alert, A&O to self. Pt happily drank chocolate ensure. Pt face washed and oral care done. Mitts remain in place.

## 2022-06-27 NOTE — Progress Notes (Addendum)
Nutrition Follow-up  DOCUMENTATION CODES:   Not applicable  INTERVENTION:  - Continue Ensure Enlive po TID, each supplement provides 350 kcal and 20 grams of protein.   NUTRITION DIAGNOSIS:   Inadequate oral intake related to decreased appetite as evidenced by meal completion < 25%.  GOAL:   Patient will meet greater than or equal to 90% of their needs  MONITOR:   PO intake, Labs, Weight trends  REASON FOR ASSESSMENT:   Consult Enteral/tube feeding initiation and management  ASSESSMENT:   88 y.o. male presented to the ED with behavioral disturbance, noted combative and agitation x 3 days. PMH includes CAD, T2DM, HTN, HLD, dementia, and diverticulosis. Pt admitted with acute metabolic encephalopathy.  Meds reviewed: sliding scale insulin, MVI. Labs reviewed.  Consult for calorie count this am. Palliative care has met with pt's family who states that the pt would not want artificial means of nutrition (I.e feeding tube). Discussed discontinuing calorie count with MD, who agreed.   Per record, pt ate 50% of his breakfast this am and drank his Ensure shake.   Feeding tubes are not recommended in pt's with end stage dementia. RD recommends continuing supplements and encouraging PO intakes. Will continue to monitor POC.   Diet Order:   Diet Order             DIET DYS 3 Room service appropriate? No; Fluid consistency: Thin  Diet effective now                   EDUCATION NEEDS:   Not appropriate for education at this time  Skin:  Skin Assessment: Reviewed RN Assessment  Last BM:  12/6  Height:   Ht Readings from Last 1 Encounters:  06/22/22 _0  (1.753 m)    Weight:   Wt Readings from Last 1 Encounters:  06/27/22 58.3 kg    Ideal Body Weight:  72.7 kg  BMI:  Body mass index is 18.98 kg/m.  Estimated Nutritional Needs:   Kcal:  1700-1900  Protein:  85-100 grams  Fluid:  >/= 1.7 L  Thalia Bloodgood, RD, LDN, CNSC.

## 2022-06-27 NOTE — Progress Notes (Addendum)
PROGRESS NOTE        PATIENT DETAILS Name: Jordan [name] Unrein Age: 88 y.o. Sex: male Date of Birth: 25-Mar-1923 Admit Date: 06/21/2022 Admitting Physician Florencia Reasons, MD NTI:[id number], [name], MD  Brief Summary: Patient is a 88 y.o.  male with history of aortic stenosis (not TAVR candidate) dementia, HTN, BPH, DM-2, HLD, lymphocytic colitis-who presented to altered mental status.  Patient was transferred to [facility] for LTM EEG/LP/neurology evaluation.  Workup in progress-see details below.  Significant events: 12/05>> [facility] ED visit for weakness/falls/confusion.  Discharged home. 12/07>> admit to [facility] for worsening confusion 12/10>> transferred to [facility] for neurology/LTM EEG/possible LP-given ongoing confusion.  Significant studies: 12/05>> CT head: No acute intracranial abnormality 12/05>> CTA head/neck: No LVO, 75% stenosis of right ICA at the origin. 12/05>> MRI brain: No acute/subacute CVA show 12/05>> EEG: No seizures.   12/07>> CT head: No acute intracranial abnormality. 12/07>> TSH: 1.35 12/08>> EEG: No seizures. 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  Significant microbiology data: 12/07>> blood culture:Staph warneri (likely contamination) 12/09>> blood culture: No growth  Procedures: None  Consults: Palliative care Neurology  Subjective: Had some intermittent delirium last night-now has a sitter in place. Easily woke up-answered all my questions appropriately Was wanting to know what was for breakfast this morning.  Objective: Vitals: Blood pressure (!) 157/67, pulse 73, temperature 97.8 F (36.6 C), temperature source Oral, resp. rate 16, height 5\' 9"  (1.753 m), weight 58.3 kg, SpO2 99 %.   Exam: Gen Exam:not in any distress HEENT:atraumatic, normocephalic Chest: B/L clear to auscultation  anteriorly CVS:S1S2 regular Abdomen:soft non tender, non distended Extremities:no edema Neurology: Non focal Skin: no rash  Pertinent Labs/Radiology:    Latest Ref Rng & Units 06/24/2022    8:55 AM 06/23/2022    4:57 AM 06/22/2022    5:21 AM  CBC  WBC 4.0 - 10.5 K/uL 3.9  6.9  6.7   Hemoglobin 13.0 - 17.0 g/dL 10.0  11.2  10.3   Hematocrit 39.0 - 52.0 % 31.4  35.8  32.5   Platelets 150 - 400 K/uL 289  346  292     Lab Results  Component Value Date   NA 139 06/26/2022   K 3.9 06/26/2022   CL 107 06/26/2022   CO2 21 (L) 06/26/2022      Assessment/Plan: Acute metabolic encephalopathy Seems to have waxing/waning mentation Some delirium last night but awake/alert this morning-similar to what he was yesterday morning. 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 Extensive workup-negative so far-see above. Discussed with neurologist-Dr. Ennis Forts then with palliative care-Mary Larach NP-goals of care Etta Grandchild will discuss with daughter today as well.  Options include LP-with send out labs for paraneoplastic syndrome and then empiric steroids, or empiric steroids without LP or supportive care/hospice care. Difficult situation-as per neurology-patient could get temporarily better with steroids if this is indeed paraneoplastic encephalopathy, but is encephalopathy could recur-as patient would still have underlying cancer Will plan accordingly after ongoing goals of care discussion.  Staph Warneri bacteremia Likely contamination Repeat blood cultures negative No longer on Rocephin.  History of lymphocytic colitis On Entocort  HLD Continue statin  HTN BP stable-continue enalapril, metoprolol  Moderate to severe aortic stenosis Recently evaluated by structural cardiology-not a candidate for TAVR  DM-2 (A1c 7.2 on 12/07) CBGs stable on  SSI  Recent Labs    06/26/22 1539 06/26/22 2108 06/27/22 0752  GLUCAP 128* 115* 91     Left upper lobe lung mass-2.6 x 2.2 cm in size CT chest on 12/10-suspicious for indolent malignancy-likely adenocarcinoma Given dementia/delirium-advanced age-do not think getting a tissue diagnosis-is going to change the outcome of management.  Do not think he will be a candidate for chemo/radiation. Suspect best option is to hospice  BPH Continue Flomax/finasteride Frequent bladder scans to ensure no retention.  Dementia Has been trialed on Aricept several years ago and had side effects-has been maintained on Namenda for the past several years Per daughter on 12/11-this apparently was done for "short-term memory issues"-but suspect he has significant amount of dementia at baseline.  Tried to explain to patient's daughter that at times when patients are at the familiar surroundings-it could mask the severity of her underlying dementia.  Severe failure to thrive syndrome Underlying dementia-delirium Continue to work with PT/OT/SLP Encourage oral intake-hopefully we can avoid sedating medication that he stays awake during the daytime and will be able to consume significant intake orally. Discussed with daughter yesterday-There will be extensive challenges with cortrak tube-with not only placing a cortrak tube but also keeping it in-as patient will likely need to be restrained/sedated to prevent him from pulling it out.  Infrarenal abdominal aortic aneurysm Supportive care Doubt any benefit from surveillance in the outpatient at this point given advanced age.  Nutrition Status: Nutrition Problem: Inadequate oral intake Etiology: decreased appetite Signs/Symptoms: meal completion < 25% Interventions: Refer to RD note for recommendations  Underweight: Estimated body mass index is 18.98 kg/m as calculated from the following:   Height as of this encounter: 5\' 9"  (1.753 m).   Weight as of this  encounter: 58.3 kg.   Code status:   Code Status: DNR   DVT Prophylaxis: heparin injection 5,000 Units Start: 06/22/22 0600 SCDs Start: 06/22/22 1610    Family Communication: Daugther [name] over the [phone] on 12/12-no answer-mailbox full-unable to leave voicemail  Addendum: was able to get hold off the daugter this afternoon-extensive discussion-regarding treatment options-described above, she asks [place] to give her some time to think/pray, we will touch base tomorrow.  Disposition Plan: Status is: Inpatient Remains inpatient appropriate because: Severe encephalopathy-workup in progress.   Planned Discharge Destination:Skilled nursing facility versus home health.   Diet: Diet Order             DIET DYS 3 Room service appropriate? No; Fluid consistency: Thin  Diet effective now                     Antimicrobial agents: Anti-infectives (From admission, onward)    Start     Dose/Rate Route Frequency Ordered Stop   06/24/22 1345  vancomycin (VANCOREADY) IVPB 1250 mg/250 mL  Status:  Discontinued       See Hyperspace for full Linked Orders Report.   1,250 mg 166.7 mL/hr over 90 Minutes Intravenous Every 24 hours 06/23/22 1248 06/23/22 1250   06/24/22 1345  vancomycin (VANCOREADY) IVPB 1250 mg/250 mL  Status:  Discontinued       See Hyperspace for full Linked Orders Report.   1,250 mg 166.7 mL/hr over 90 Minutes Intravenous Every 24 hours 06/23/22 1250 06/23/22 1251   06/24/22 1345  vancomycin (VANCOREADY) IVPB 750 mg/150 mL  Status:  Discontinued  See Hyperspace for full Linked Orders Report.   750 mg 150 mL/hr over 60 Minutes Intravenous Every 24 hours 06/23/22 1251 06/23/22 1653   06/24/22 1200  cefTRIAXone (ROCEPHIN) 2 g in sodium chloride 0.9 % 100 mL IVPB  Status:  Discontinued        2 g 200 mL/hr over 30 Minutes Intravenous Every 24 hours 06/23/22 1653 06/25/22 0929   06/23/22 1345  vancomycin (VANCOREADY) IVPB 1250 mg/250 mL  Status:   Discontinued       See Hyperspace for full Linked Orders Report.   1,250 mg 166.7 mL/hr over 90 Minutes Intravenous  Once 06/23/22 1248 06/23/22 1250   06/23/22 1345  vancomycin (VANCOREADY) IVPB 750 mg/150 mL  Status:  Discontinued       See Hyperspace for full Linked Orders Report.   750 mg 150 mL/hr over 60 Minutes Intravenous  Once 06/23/22 1250 06/23/22 1251   06/23/22 1345  vancomycin (VANCOREADY) IVPB 1250 mg/250 mL       See Hyperspace for full Linked Orders Report.   1,250 mg 166.7 mL/hr over 90 Minutes Intravenous  Once 06/23/22 1251 06/23/22 1630        MEDICATIONS: Scheduled Meds:  aspirin EC  81 mg Oral Q breakfast   budesonide  3 mg Oral Daily   enalapril  10 mg Oral Daily   feeding supplement  237 mL Oral TID PC   finasteride  5 mg Oral Daily   heparin  5,000 Units Subcutaneous Q8H   insulin aspart  0-15 Units Subcutaneous TID WC   melatonin  5 mg Oral QHS   memantine  10 mg Oral BID   metoprolol tartrate  5 mg Intravenous Q8H   mirabegron ER  25 mg Oral Daily   multivitamin with minerals  1 tablet Oral Daily   pravastatin  80 mg Oral Daily   QUEtiapine  75 mg Oral QHS   tamsulosin  0.4 mg Oral QPC supper   Continuous Infusions:   PRN Meds:.acetaminophen **OR** acetaminophen, haloperidol lactate, hydrALAZINE, labetalol, ondansetron **OR** ondansetron (ZOFRAN) IV, mouth rinse   I have personally reviewed following labs and imaging studies  LABORATORY DATA: CBC: Recent Labs  Lab 06/20/22 1108 06/20/22 1125 06/22/22 0008 06/22/22 0521 06/23/22 0457 06/24/22 0855  WBC 6.5  --  6.2 6.7 6.9 3.9*  NEUTROABS 4.5  --  4.4 4.8  --  2.5  HGB 12.0* 11.9* 10.3* 10.3* 11.2* 10.0*  HCT 37.5* 35.0* 32.1* 32.5* 35.8* 31.4*  MCV 92.1  --  92.5 92.1 93.2 91.5  PLT 366  --  319 292 346 289     Basic Metabolic Panel: Recent Labs  Lab 06/22/22 0521 06/22/22 0820 06/23/22 0457 06/24/22 0855 06/26/22 0423  NA 136 138 144 141 139  K 5.7* 4.2 5.3* 4.0 3.9   CL 105 106 110 110 107  CO2 23 27 25 22  21*  GLUCOSE 167* 92 141* 96 97  BUN 32* 31* 27* 27* 23  CREATININE 1.17 1.06 1.10 1.12 1.06  CALCIUM 8.3* 8.6* 8.8* 8.3* 8.2*  MG 2.5*  --   --   --   --      GFR: Estimated Creatinine Clearance: 32.8 mL/min (by C-G formula based on SCr of 1.06 mg/dL).  Liver Function Tests: Recent Labs  Lab 06/20/22 1108 06/22/22 0521 06/23/22 0457  AST 21 44* 27  ALT 21 22 20   ALKPHOS 103 90 97  BILITOT 0.5 1.2 0.4  PROT 6.8 5.9* 6.1*  ALBUMIN 3.4*  2.9* 3.1*    No results for input(s): "LIPASE", "AMYLASE" in the last 168 hours. Recent Labs  Lab 06/20/22 1407 06/24/22 0855  AMMONIA 15 13     Coagulation Profile: Recent Labs  Lab 06/20/22 1108  INR 1.1     Cardiac Enzymes: Recent Labs  Lab 06/23/22 0457  CKTOTAL 164     BNP (last 3 results) No results for input(s): "PROBNP" in the last 8760 hours.  Lipid Profile: No results for input(s): "CHOL", "HDL", "LDLCALC", "TRIG", "CHOLHDL", "LDLDIRECT" in the last 72 hours.  Thyroid Function Tests: No results for input(s): "TSH", "T4TOTAL", "FREET4", "T3FREE", "THYROIDAB" in the last 72 hours.  Anemia Panel: No results for input(s): "VITAMINB12", "FOLATE", "FERRITIN", "TIBC", "IRON", "RETICCTPCT" in the last 72 hours.   Urine analysis:    Component Value Date/Time   COLORURINE STRAW (A) 06/22/2022 0509   APPEARANCEUR CLEAR 06/22/2022 0509   APPEARANCEUR Clear 03/17/2022 1240   LABSPEC 1.008 06/22/2022 0509   PHURINE 7.0 06/22/2022 0509   GLUCOSEU NEGATIVE 06/22/2022 0509   HGBUR NEGATIVE 06/22/2022 0509   BILIRUBINUR NEGATIVE 06/22/2022 0509   BILIRUBINUR negative 04/13/2022 1525   BILIRUBINUR Negative 03/17/2022 1240   KETONESUR NEGATIVE 06/22/2022 0509   PROTEINUR NEGATIVE 06/22/2022 0509   UROBILINOGEN 1.0 04/13/2022 1525   NITRITE NEGATIVE 06/22/2022 0509   LEUKOCYTESUR NEGATIVE 06/22/2022 0509    Sepsis Labs: Lactic Acid, Venous No results found for:  "LATICACIDVEN"  MICROBIOLOGY: Recent Results (from the past 240 hour(s))  Culture, blood (Routine X 2) w Reflex to ID Panel     Status: Abnormal   Collection Time: 06/22/22  5:03 PM   Specimen: Left Antecubital; Blood  Result Value Ref Range Status   Specimen Description   Final    LEFT ANTECUBITAL Performed at [facility], [address], [place], [place] [address]    Special Requests   Final    BOTTLES DRAWN AEROBIC AND ANAEROBIC Blood Culture adequate volume Performed at [facility], [address], [place], [place] [address]    Culture  Setup Time   Final    GRAM POSITIVE COCCI IN BOTH AEROBIC AND ANAEROBIC BOTTLES Gram Stain Report Called to,Read Back By and Verified With: COLEMAN @ [place] ON [id number] BY HENDERSON L CRITICAL RESULT CALLED TO, READ BACK BY AND VERIFIED WITH: [place] ON 06/23/22 @ 1619 BY DRT    Culture (A)  Final    STAPHYLOCOCCUS WARNERI THE SIGNIFICANCE OF ISOLATING THIS ORGANISM FROM A SINGLE SET OF BLOOD CULTURES WHEN MULTIPLE SETS ARE DRAWN IS UNCERTAIN. PLEASE NOTIFY THE MICROBIOLOGY DEPARTMENT WITHIN ONE WEEK IF SPECIATION AND SENSITIVITIES ARE REQUIRED. Performed at [facility], [place] [address] [place], [place] [address]    Report Status 06/25/2022 FINAL  Final  Culture, blood (Routine X 2) w Reflex to ID Panel     Status: None   Collection Time: 06/22/22  5:03 PM   Specimen: Right Antecubital; Blood  Result Value Ref Range Status   Specimen Description RIGHT ANTECUBITAL  Final   Special Requests   Final    BOTTLES DRAWN AEROBIC AND ANAEROBIC Blood Culture adequate volume   Culture   Final    NO GROWTH 5 DAYS Performed at [facility], [address], [place], [place] [address]    Report Status 06/27/2022 FINAL  Final  Blood Culture ID Panel (Reflexed)     Status: Abnormal   Collection Time: 06/22/22  5:03 PM  Result Value Ref Range Status   Enterococcus faecalis NOT  DETECTED NOT DETECTED Final   Enterococcus Faecium NOT DETECTED  NOT DETECTED Final   Listeria monocytogenes NOT DETECTED NOT DETECTED Final   Staphylococcus species DETECTED (A) NOT DETECTED Final    Comment: CRITICAL RESULT CALLED TO, READ BACK BY AND VERIFIED WITH: [place] ON 06/23/22 @ 1619 BY DRT    Staphylococcus aureus (BCID) NOT DETECTED NOT DETECTED Final   Staphylococcus epidermidis NOT DETECTED NOT DETECTED Final   Staphylococcus lugdunensis NOT DETECTED NOT DETECTED Final   Streptococcus species NOT DETECTED NOT DETECTED Final   Streptococcus agalactiae NOT DETECTED NOT DETECTED Final   Streptococcus pneumoniae NOT DETECTED NOT DETECTED Final   Streptococcus pyogenes NOT DETECTED NOT DETECTED Final   A.calcoaceticus-baumannii NOT DETECTED NOT DETECTED Final   Bacteroides fragilis NOT DETECTED NOT DETECTED Final   Enterobacterales NOT DETECTED NOT DETECTED Final   Enterobacter cloacae complex NOT DETECTED NOT DETECTED Final   Escherichia coli NOT DETECTED NOT DETECTED Final   Klebsiella aerogenes NOT DETECTED NOT DETECTED Final   Klebsiella oxytoca NOT DETECTED NOT DETECTED Final   Klebsiella pneumoniae NOT DETECTED NOT DETECTED Final   Proteus species NOT DETECTED NOT DETECTED Final   Salmonella species NOT DETECTED NOT DETECTED Final   Serratia marcescens NOT DETECTED NOT DETECTED Final   Haemophilus influenzae NOT DETECTED NOT DETECTED Final   Neisseria meningitidis NOT DETECTED NOT DETECTED Final   Pseudomonas aeruginosa NOT DETECTED NOT DETECTED Final   Stenotrophomonas maltophilia NOT DETECTED NOT DETECTED Final   Candida albicans NOT DETECTED NOT DETECTED Final   Candida auris NOT DETECTED NOT DETECTED Final   Candida glabrata NOT DETECTED NOT DETECTED Final   Candida krusei NOT DETECTED NOT DETECTED Final   Candida parapsilosis NOT DETECTED NOT DETECTED Final   Candida tropicalis NOT DETECTED NOT DETECTED Final   Cryptococcus neoformans/gattii NOT DETECTED NOT DETECTED Final    Comment: Performed at [facility], [address] [place], [place] [address]  MRSA Next Gen by PCR, Nasal     Status: None   Collection Time: 06/23/22  5:27 PM   Specimen: Nasal Mucosa; Nasal Swab  Result Value Ref Range Status   MRSA by PCR Next Gen NOT DETECTED NOT DETECTED Final    Comment: (NOTE) The GeneXpert MRSA Assay (FDA approved for NASAL specimens only), is one component of a comprehensive MRSA colonization surveillance program. It is not intended to diagnose MRSA infection nor to guide or monitor treatment for MRSA infections. Test performance is not FDA approved in patients less than 56 years old. Performed at [facility], [address], [place], [place] [address]   Culture, blood (Routine X 2) w Reflex to ID Panel     Status: None (Preliminary result)   Collection Time: 06/24/22  8:55 AM   Specimen: Right Antecubital  Result Value Ref Range Status   Specimen Description   Final    RIGHT ANTECUBITAL BOTTLES DRAWN AEROBIC AND ANAEROBIC   Special Requests   Final    Blood Culture results may not be optimal due to an excessive volume of blood received in culture bottles   Culture   Final    NO GROWTH 3 DAYS Performed at [facility], [address], [place], [place] [address]    Report Status PENDING  Incomplete  Culture, blood (Routine X 2) w Reflex to ID Panel     Status: None (Preliminary result)   Collection Time: 06/24/22  8:55 AM   Specimen: BLOOD RIGHT HAND  Result Value Ref  Range Status   Specimen Description   Final    BLOOD RIGHT HAND BOTTLES DRAWN AEROBIC AND ANAEROBIC   Special Requests   Final    Blood Culture results may not be optimal due to an excessive volume of blood received in culture bottles   Culture   Final    NO GROWTH 3 DAYS Performed at [facility], [address] [place], [place] [address]    Report Status PENDING  Incomplete    RADIOLOGY STUDIES/RESULTS: Overnight EEG with video  Result Date: 06/26/2022 Samuella Cota, MD     06/26/2022  7:58 AM EEG Procedure  CPT/Type of Study: 27517; 2-12hr EEG with video Referring Provider: Tykesha Konicki Primary Neurological Diagnosis: AMS History: This is a 88 yr old patient, undergoing an EEG to evaluate for AMS. Clinical State: disoriented Technical Description: The EEG was performed using standard setting per the guidelines of American Clinical Neurophysiology Society (ACNS). A minimum of 21 electrodes were placed on scalp according to the International 10-20 or/and 10-10 Systems. Supplemental electrodes were placed as needed. Single EKG electrode was also used to detect cardiac arrhythmia. Patient's behavior was continuously recorded on video simultaneously with EEG. A minimum of 16 channels were used for data display. Each epoch of study was reviewed manually daily and as needed using standard referential and bipolar montages. Computerized quantitative EEG analysis (such as compressed spectral array analysis, trending, automated spike & seizure detection) were used as indicated. Day 1: from 1324 06/25/22 to 1856 06/26/22 EEG Description: Overall Amplitude:Normal Predominant Frequency: The background activity showed theta/delta slowing occurring frequently Superimposed Frequencies: sparse beta activity bilaterally The background was symmetric Background Abnormalities: Generalized slowing as above Rhythmic or periodic pattern: No Epileptiform activity: no Electrographic seizures: no Events: no Breach rhythm: no Reactivity: Present Stimulation procedures: Hyperventilation: not done Photic stimulation: not done Sleep Background: Stage I EKG:no significant arrhythmia Impression: This was an abnormal continuous EEG due to diffuse background slowing, indicative of a non-specific encephalopathy pattern. No seizures or epileptiform discharges were seen.   CT CHEST ABDOMEN PELVIS W CONTRAST  Result Date: 06/25/2022 CLINICAL DATA:  Lung mass identified by CTA head and neck, evaluate for malignancy * Tracking Code: BO * EXAM: CT CHEST,  ABDOMEN, AND PELVIS WITH CONTRAST TECHNIQUE: Multidetector CT imaging of the chest, abdomen and pelvis was performed following the standard protocol during bolus administration of intravenous contrast. RADIATION DOSE REDUCTION: This exam was performed according to the departmental dose-optimization program which includes automated exposure control, adjustment of the mA and/or kV according to patient size and/or use of iterative reconstruction technique. CONTRAST:  8mL OMNIPAQUE IOHEXOL 350 MG/ML SOLN COMPARISON:  CT chest angiogram, 06/13/2016, CT abdomen pelvis, 06/17/2016 FINDINGS: CT CHEST FINDINGS Cardiovascular: Aortic atherosclerosis. Aortic valve calcifications. Normal heart size. Three-vessel coronary artery calcifications. No pericardial effusion. Mediastinum/Nodes: No enlarged mediastinal, hilar, or axillary lymph nodes. Thyroid gland, trachea, and esophagus demonstrate no significant findings. Lungs/Pleura: In comparison to most recent examination of the chest dated 06/13/2016, there has been significant interval increase in size and solid character of a previously seen subsolid opacity of the peripheral left upper lobe, now measuring 2.6 x 2.2 cm, previously 2.1 x 1.4 cm (series 5, image 37). Mild underlying centrilobular emphysema. Diffuse bilateral bronchial wall thickening. Small bilateral pleural effusions. No pleural effusion or pneumothorax. Musculoskeletal: No chest wall abnormality. No acute osseous findings. CT ABDOMEN PELVIS FINDINGS Hepatobiliary: No solid liver abnormality is seen. No gallstones, gallbladder wall thickening, or biliary dilatation. Pancreas: Unremarkable. No pancreatic ductal dilatation  or surrounding inflammatory changes. Spleen: Normal in size without significant abnormality. Adrenals/Urinary Tract: Adrenal glands are unremarkable. Kidneys are normal, without renal calculi, solid lesion, or hydronephrosis. Bladder is unremarkable. Stomach/Bowel: Stomach is within normal  limits. Diverticulum of the descending duodenum. Appendix appears normal. No evidence of bowel wall thickening, distention, or inflammatory changes. Pancolonic diverticulosis. Vascular/Lymphatic: Aortic atherosclerosis. Interval enlargement of an aneurysm of the infrarenal abdominal aorta measuring up to 4.4 x 3.8 cm, previously 3.8 x 3.7 cm (series 3, image 68). No enlarged abdominal or pelvic lymph nodes. Reproductive: Prostatomegaly. Other: Small, fat containing left inguinal hernia.  No ascites. Musculoskeletal: No acute osseous findings. IMPRESSION: 1. In comparison to most recent examination of the chest dated 06/13/2016, there has been significant interval increase in size and solid character of a previously seen subsolid opacity of the peripheral left upper lobe, now measuring 2.6 x 2.2 cm, previously 2.1 x 1.4 cm. This is highly concerning for indolent adenocarcinoma. PET-CT characterization and tissue sampling can be considered if clinically appropriate given advanced patient age. 2. No evidence of lymphadenopathy or metastatic disease in the chest, abdomen, or pelvis. 3. Mild emphysema and diffuse bilateral bronchial wall thickening. 4. Small bilateral pleural effusions. 5. Interval enlargement of an aneurysm of the infrarenal abdominal aorta measuring up to 4.4 x 3.8 cm, previously 3.8 x 3.7 cm. Recommend follow-up every 12 months and vascular consultation if clinically appropriate. This recommendation follows ACR consensus guidelines: White Paper of the ACR Incidental Findings Committee II on Vascular Findings. J Am Coll Radiol 2013; 10:789-794. 6. Prostatomegaly. 7. Coronary artery disease. Aortic Atherosclerosis (ICD10-I70.0) and Emphysema (ICD10-J43.9). Electronically Signed   By: Delanna Ahmadi M.D.   On: 06/25/2022 16:44     LOS: 4 days   Oren Binet, MD  [facility]    To contact the attending provider between 7A-7P or the covering provider during after hours 7P-7A, please log  into the web site [email or web address] and access using universal [place] password for that web site. If you do not have the password, please call the hospital operator.  06/27/2022, 9:11 AM

## 2022-06-27 NOTE — Progress Notes (Signed)
Yes, yelling out intermittently, attempting to get out of bed, fluctuates between sleeping soundly and then wakes up and begans disrobing, pulling at condom cath, turning self around in bed. Dr. Sloan Leiter updated this morning on pt's progress throughout the night.

## 2022-06-28 DIAGNOSIS — G9341 Metabolic encephalopathy: Secondary | ICD-10-CM | POA: Diagnosis not present

## 2022-06-28 DIAGNOSIS — I10 Essential (primary) hypertension: Secondary | ICD-10-CM | POA: Diagnosis not present

## 2022-06-28 DIAGNOSIS — G9340 Encephalopathy, unspecified: Secondary | ICD-10-CM

## 2022-06-28 DIAGNOSIS — F03918 Unspecified dementia, unspecified severity, with other behavioral disturbance: Secondary | ICD-10-CM | POA: Diagnosis not present

## 2022-06-28 DIAGNOSIS — E118 Type 2 diabetes mellitus with unspecified complications: Secondary | ICD-10-CM | POA: Diagnosis not present

## 2022-06-28 LAB — GLUCOSE, CAPILLARY
Glucose-Capillary: 117 mg/dL — ABNORMAL HIGH (ref 70–99)
Glucose-Capillary: 194 mg/dL — ABNORMAL HIGH (ref 70–99)
Glucose-Capillary: 117 mg/dL — ABNORMAL HIGH (ref 70–99)
Glucose-Capillary: 186 mg/dL — ABNORMAL HIGH (ref 70–99)

## 2022-06-28 LAB — BASIC METABOLIC PANEL
Anion gap: 8 (ref 5–15)
BUN: 30 mg/dL — ABNORMAL HIGH (ref 8–23)
CO2: 24 mmol/L (ref 22–32)
Calcium: 8.1 mg/dL — ABNORMAL LOW (ref 8.9–10.3)
Chloride: 104 mmol/L (ref 98–111)
Creatinine, Ser: 1.05 mg/dL (ref 0.61–1.24)
GFR, Estimated: >60 mL/min (ref >60)
Glucose, Bld: 139 mg/dL — ABNORMAL HIGH (ref 70–99)
Potassium: 3.8 mmol/L (ref 3.5–5.1)
Sodium: 136 mmol/L (ref 135–145)

## 2022-06-28 NOTE — Progress Notes (Signed)
PROGRESS NOTE        PATIENT DETAILS Name: Jordan [name] Age: 88 y.o. Sex: male Date of Birth: 1922-12-20 Admit Date: 06/21/2022 Admitting Physician Florencia Reasons, MD OIN:OMVEH, [name], MD  Brief Summary: Patient is a 88 y.o.  male with history of aortic stenosis (not TAVR candidate) dementia, HTN, BPH, DM-2, HLD, lymphocytic colitis-who presented to altered mental status.  Patient was transferred to [facility] for LTM EEG/LP/neurology evaluation.  Workup in progress-see details below.  Significant events: 12/05>> [facility] ED visit for weakness/falls/confusion.  Discharged home. 12/07>> admit to [facility] for worsening confusion 12/10>> transferred to [facility] for neurology/LTM EEG/possible LP-given ongoing confusion.  Significant studies: 12/05>> CT head: No acute intracranial abnormality 12/05>> CTA head/neck: No LVO, 75% stenosis of right ICA at the origin. 12/05>> MRI brain: No acute/subacute CVA show 12/05>> EEG: No seizures.   12/07>> CT head: No acute intracranial abnormality. 12/07>> TSH: 1.35 12/08>> EEG: No seizures. 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  Significant microbiology data: 12/07>> blood culture:Staph warneri (likely contamination) 12/09>> blood culture: No growth  Procedures: None  Consults: Palliative care Neurology  Subjective: Uneventful night-no major delirium like the night before. Sitting up in bed-sitter at bedside-and is eating breakfast.  More questions today compared to yesterday.  Objective: Vitals: Blood pressure (!) 106/57, pulse 72, temperature 98.5 F (36.9 C), temperature source Oral, resp. rate 15, height 5\' 9"  (1.753 m), weight 58.3 kg, SpO2 95 %.   Exam: Gen Exam:not in any distress HEENT:atraumatic, normocephalic Chest: B/L clear to auscultation anteriorly CVS:S1S2  regular Abdomen:soft non tender, non distended Extremities:no edema Neurology: Non focal Skin: no rash  Pertinent Labs/Radiology:    Latest Ref Rng & Units 06/24/2022    8:55 AM 06/23/2022    4:57 AM 06/22/2022    5:21 AM  CBC  WBC 4.0 - 10.5 K/uL 3.9  6.9  6.7   Hemoglobin 13.0 - 17.0 g/dL 10.0  11.2  10.3   Hematocrit 39.0 - 52.0 % 31.4  35.8  32.5   Platelets 150 - 400 K/uL 289  346  292     Lab Results  Component Value Date   NA 136 06/28/2022   K 3.8 06/28/2022   CL 104 06/28/2022   CO2 24 06/28/2022      Assessment/Plan: Acute metabolic encephalopathy Improving-but continues to have waxing/waning mentation. Uneventful night-minimal delirium but mostly slept through it per nursing staff. Please see my note from 12/12-Long discussion with daughter-palliative care to follow-up with her later today.  Ongoing goals of care discussion to determine next course of action.  Staph Warneri bacteremia Likely contamination Repeat blood cultures negative No longer on Rocephin.  History of lymphocytic colitis On Entocort  HLD Continue statin  HTN BP stable-continue enalapril, metoprolol  Moderate to severe aortic stenosis Recently evaluated by structural cardiology-not a candidate for TAVR  DM-2 (A1c 7.2 on 12/07) CBGs stable on SSI  Recent Labs    06/27/22 1850 06/27/22 2208 06/28/22 0737  GLUCAP 148* 188* 117*     Left upper lobe lung mass-2.6 x 2.2 cm in size CT chest on 12/10-suspicious for indolent malignancy-likely adenocarcinoma Given dementia/delirium-advanced age-do not think getting a tissue diagnosis-is going to change the outcome of management.  Do not think he will be a  candidate for chemo/radiation. Suspect best option is to hospice  BPH Continue Flomax/finasteride Frequent bladder scans to ensure no retention.  Dementia Has been trialed on Aricept several years ago and had side effects-has been maintained on Namenda for the past several  years Per daughter on 12/11-this apparently was done for "short-term memory issues"-but suspect he has significant amount of dementia at baseline.  Tried to explain to patient's daughter that at times when patients are at the familiar surroundings-it could mask the severity of her underlying dementia.  Severe failure to thrive syndrome Underlying dementia-delirium Continue to work with PT/OT/SLP Thankfully with some improvement in mental status-he is now starting to eat Do not think a tube is required at this point.  Infrarenal abdominal aortic aneurysm Supportive care Doubt any benefit from surveillance in the outpatient at this point given advanced age.  Nutrition Status: Nutrition Problem: Inadequate oral intake Etiology: decreased appetite Signs/Symptoms: meal completion < 25% Interventions: Refer to RD note for recommendations  Underweight: Estimated body mass index is 18.98 kg/m as calculated from the following:   Height as of this encounter: 5\' 9"  (1.753 m).   Weight as of this encounter: 58.3 kg.   Code status:   Code Status: DNR   DVT Prophylaxis: heparin injection 5,000 Units Start: 06/22/22 0600 SCDs Start: 06/22/22 1027    Family Communication: Daugther [name] over the [phone] on 12/12-palliative care to continue goals of care discussion.  Disposition Plan: Status is: Inpatient Remains inpatient appropriate because: Severe encephalopathy-workup in progress.   Planned Discharge Destination:Skilled nursing facility versus home health.   Diet: Diet Order             DIET DYS 3 Room service appropriate? No; Fluid consistency: Thin  Diet effective now                     Antimicrobial agents: Anti-infectives (From admission, onward)    Start     Dose/Rate Route Frequency Ordered Stop   06/24/22 1345  vancomycin (VANCOREADY) IVPB 1250 mg/250 mL  Status:  Discontinued       See Hyperspace for full Linked Orders Report.   1,250 mg 166.7  mL/hr over 90 Minutes Intravenous Every 24 hours 06/23/22 1248 06/23/22 1250   06/24/22 1345  vancomycin (VANCOREADY) IVPB 1250 mg/250 mL  Status:  Discontinued       See Hyperspace for full Linked Orders Report.   1,250 mg 166.7 mL/hr over 90 Minutes Intravenous Every 24 hours 06/23/22 1250 06/23/22 1251   06/24/22 1345  vancomycin (VANCOREADY) IVPB 750 mg/150 mL  Status:  Discontinued       See Hyperspace for full Linked Orders Report.   750 mg 150 mL/hr over 60 Minutes Intravenous Every 24 hours 06/23/22 1251 06/23/22 1653   06/24/22 1200  cefTRIAXone (ROCEPHIN) 2 g in sodium chloride 0.9 % 100 mL IVPB  Status:  Discontinued        2 g 200 mL/hr over 30 Minutes Intravenous Every 24 hours 06/23/22 1653 06/25/22 0929   06/23/22 1345  vancomycin (VANCOREADY) IVPB 1250 mg/250 mL  Status:  Discontinued       See Hyperspace for full Linked Orders Report.   1,250 mg 166.7 mL/hr over 90 Minutes Intravenous  Once 06/23/22 1248 06/23/22 1250   06/23/22 1345  vancomycin (VANCOREADY) IVPB 750 mg/150 mL  Status:  Discontinued       See Hyperspace for full Linked Orders Report.   750 mg 150 mL/hr over 60 Minutes Intravenous  Once 06/23/22 1250 06/23/22 1251   06/23/22 1345  vancomycin (VANCOREADY) IVPB 1250 mg/250 mL       See Hyperspace for full Linked Orders Report.   1,250 mg 166.7 mL/hr over 90 Minutes Intravenous  Once 06/23/22 1251 06/23/22 1630        MEDICATIONS: Scheduled Meds:  aspirin EC  81 mg Oral Q breakfast   budesonide  3 mg Oral Daily   enalapril  10 mg Oral Daily   feeding supplement  237 mL Oral TID PC   finasteride  5 mg Oral Daily   heparin  5,000 Units Subcutaneous Q8H   insulin aspart  0-15 Units Subcutaneous TID WC   melatonin  5 mg Oral QHS   memantine  10 mg Oral BID   metoprolol tartrate  5 mg Intravenous Q8H   mirabegron ER  25 mg Oral Daily   multivitamin with minerals  1 tablet Oral Daily   pravastatin  80 mg Oral Daily   QUEtiapine  75 mg Oral QHS    tamsulosin  0.4 mg Oral QPC supper   Continuous Infusions:   PRN Meds:.acetaminophen **OR** acetaminophen, haloperidol lactate, hydrALAZINE, labetalol, ondansetron **OR** ondansetron (ZOFRAN) IV, mouth rinse   I have personally reviewed following labs and imaging studies  LABORATORY DATA: CBC: Recent Labs  Lab 06/22/22 0008 06/22/22 0521 06/23/22 0457 06/24/22 0855  WBC 6.2 6.7 6.9 3.9*  NEUTROABS 4.4 4.8  --  2.5  HGB 10.3* 10.3* 11.2* 10.0*  HCT 32.1* 32.5* 35.8* 31.4*  MCV 92.5 92.1 93.2 91.5  PLT 319 292 346 289     Basic Metabolic Panel: Recent Labs  Lab 06/22/22 0521 06/22/22 0820 06/23/22 0457 06/24/22 0855 06/26/22 0423 06/28/22 0304  NA 136 138 144 141 139 136  K 5.7* 4.2 5.3* 4.0 3.9 3.8  CL 105 106 110 110 107 104  CO2 23 27 25 22  21* 24  GLUCOSE 167* 92 141* 96 97 139*  BUN 32* 31* 27* 27* 23 30*  CREATININE 1.17 1.06 1.10 1.12 1.06 1.05  CALCIUM 8.3* 8.6* 8.8* 8.3* 8.2* 8.1*  MG 2.5*  --   --   --   --   --      GFR: Estimated Creatinine Clearance: 33.2 mL/min (by C-G formula based on SCr of 1.05 mg/dL).  Liver Function Tests: Recent Labs  Lab 06/22/22 0521 06/23/22 0457  AST 44* 27  ALT 22 20  ALKPHOS 90 97  BILITOT 1.2 0.4  PROT 5.9* 6.1*  ALBUMIN 2.9* 3.1*    No results for input(s): "LIPASE", "AMYLASE" in the last 168 hours. Recent Labs  Lab 06/24/22 0855  AMMONIA 13     Coagulation Profile: No results for input(s): "INR", "PROTIME" in the last 168 hours.   Cardiac Enzymes: Recent Labs  Lab 06/23/22 0457  CKTOTAL 164     BNP (last 3 results) No results for input(s): "PROBNP" in the last 8760 hours.  Lipid Profile: No results for input(s): "CHOL", "HDL", "LDLCALC", "TRIG", "CHOLHDL", "LDLDIRECT" in the last 72 hours.  Thyroid Function Tests: No results for input(s): "TSH", "T4TOTAL", "FREET4", "T3FREE", "THYROIDAB" in the last 72 hours.  Anemia Panel: No results for input(s): "VITAMINB12", "FOLATE",  "FERRITIN", "TIBC", "IRON", "RETICCTPCT" in the last 72 hours.   Urine analysis:    Component Value Date/Time   COLORURINE STRAW (A) 06/22/2022 0509   APPEARANCEUR CLEAR 06/22/2022 0509   APPEARANCEUR Clear 03/17/2022 1240   LABSPEC 1.008 06/22/2022 0509   PHURINE 7.0 06/22/2022 0509  GLUCOSEU NEGATIVE 06/22/2022 0509   HGBUR NEGATIVE 06/22/2022 0509   BILIRUBINUR NEGATIVE 06/22/2022 0509   BILIRUBINUR negative 04/13/2022 1525   BILIRUBINUR Negative 03/17/2022 [place] 06/22/2022 0509   PROTEINUR NEGATIVE 06/22/2022 0509   UROBILINOGEN 1.0 04/13/2022 1525   NITRITE NEGATIVE 06/22/2022 0509   LEUKOCYTESUR NEGATIVE 06/22/2022 0509    Sepsis Labs: Lactic Acid, Venous No results found for: "LATICACIDVEN"  MICROBIOLOGY: Recent Results (from the past 240 hour(s))  Culture, blood (Routine X 2) w Reflex to ID Panel     Status: Abnormal   Collection Time: 06/22/22  5:03 PM   Specimen: Left Antecubital; Blood  Result Value Ref Range Status   Specimen Description   Final    LEFT ANTECUBITAL Performed at [facility], [address] [place], [place] [address]    Special Requests   Final    BOTTLES DRAWN AEROBIC AND ANAEROBIC Blood Culture adequate volume Performed at [facility], [address] [place], [place] [address]    Culture  Setup Time   Final    GRAM POSITIVE COCCI IN BOTH AEROBIC AND ANAEROBIC BOTTLES Gram Stain Report Called to,Read Back By and Verified With: COLEMAN @ 0037 ON [id number] BY HENDERSON L CRITICAL RESULT CALLED TO, READ BACK BY AND VERIFIED WITH: [place] ON 06/23/22 @ 1619 BY DRT    Culture (A)  Final    STAPHYLOCOCCUS WARNERI THE SIGNIFICANCE OF ISOLATING THIS ORGANISM FROM A SINGLE SET OF BLOOD CULTURES WHEN MULTIPLE SETS ARE DRAWN IS UNCERTAIN. PLEASE NOTIFY THE MICROBIOLOGY DEPARTMENT WITHIN ONE WEEK IF SPECIATION AND SENSITIVITIES ARE REQUIRED. Performed at [facility], [place] [address], [place], [place] [address]     Report Status 06/25/2022 FINAL  Final  Culture, blood (Routine X 2) w Reflex to ID Panel     Status: None   Collection Time: 06/22/22  5:03 PM   Specimen: Right Antecubital; Blood  Result Value Ref Range Status   Specimen Description RIGHT ANTECUBITAL  Final   Special Requests   Final    BOTTLES DRAWN AEROBIC AND ANAEROBIC Blood Culture adequate volume   Culture   Final    NO GROWTH 5 DAYS Performed at [facility], [address] [place], [place] [address]    Report Status 06/27/2022 FINAL  Final  Blood Culture ID Panel (Reflexed)     Status: Abnormal   Collection Time: 06/22/22  5:03 PM  Result Value Ref Range Status   Enterococcus faecalis NOT DETECTED NOT DETECTED Final   Enterococcus Faecium NOT DETECTED NOT DETECTED Final   Listeria monocytogenes NOT DETECTED NOT DETECTED Final   Staphylococcus species DETECTED (A) NOT DETECTED Final    Comment: CRITICAL RESULT CALLED TO, READ BACK BY AND VERIFIED WITH: PHARMD LAURIE POOLE ON 06/23/22 @ 1619 BY DRT    Staphylococcus aureus (BCID) NOT DETECTED NOT DETECTED Final   Staphylococcus epidermidis NOT DETECTED NOT DETECTED Final   Staphylococcus lugdunensis NOT DETECTED NOT DETECTED Final   Streptococcus species NOT DETECTED NOT DETECTED Final   Streptococcus agalactiae NOT DETECTED NOT DETECTED Final   Streptococcus pneumoniae NOT DETECTED NOT DETECTED Final   Streptococcus pyogenes NOT DETECTED NOT DETECTED Final   A.calcoaceticus-baumannii NOT DETECTED NOT DETECTED Final   Bacteroides fragilis NOT DETECTED NOT DETECTED Final   Enterobacterales NOT DETECTED NOT DETECTED Final   Enterobacter cloacae complex NOT DETECTED NOT DETECTED Final   Escherichia coli NOT DETECTED NOT DETECTED Final   Klebsiella aerogenes NOT DETECTED NOT DETECTED Final  Klebsiella oxytoca NOT DETECTED NOT DETECTED Final   Klebsiella pneumoniae NOT DETECTED NOT DETECTED Final   Proteus species NOT DETECTED NOT DETECTED Final   Salmonella species NOT  DETECTED NOT DETECTED Final   Serratia marcescens NOT DETECTED NOT DETECTED Final   Haemophilus influenzae NOT DETECTED NOT DETECTED Final   Neisseria meningitidis NOT DETECTED NOT DETECTED Final   Pseudomonas aeruginosa NOT DETECTED NOT DETECTED Final   Stenotrophomonas maltophilia NOT DETECTED NOT DETECTED Final   Candida albicans NOT DETECTED NOT DETECTED Final   Candida auris NOT DETECTED NOT DETECTED Final   Candida glabrata NOT DETECTED NOT DETECTED Final   Candida krusei NOT DETECTED NOT DETECTED Final   Candida parapsilosis NOT DETECTED NOT DETECTED Final   Candida tropicalis NOT DETECTED NOT DETECTED Final   Cryptococcus neoformans/gattii NOT DETECTED NOT DETECTED Final    Comment: Performed at [facility], [place] [address], [place], [place] [address]  MRSA Next Gen by PCR, Nasal     Status: None   Collection Time: 06/23/22  5:27 PM   Specimen: Nasal Mucosa; Nasal Swab  Result Value Ref Range Status   MRSA by PCR Next Gen NOT DETECTED NOT DETECTED Final    Comment: (NOTE) The GeneXpert MRSA Assay (FDA approved for NASAL specimens only), is one component of a comprehensive MRSA colonization surveillance program. It is not intended to diagnose MRSA infection nor to guide or monitor treatment for MRSA infections. Test performance is not FDA approved in patients less than 46 years old. Performed at [facility], [address], [place], [place] [address]   Culture, blood (Routine X 2) w Reflex to ID Panel     Status: None (Preliminary result)   Collection Time: 06/24/22  8:55 AM   Specimen: Right Antecubital  Result Value Ref Range Status   Specimen Description   Final    RIGHT ANTECUBITAL BOTTLES DRAWN AEROBIC AND ANAEROBIC   Special Requests   Final    Blood Culture results may not be optimal due to an excessive volume of blood received in culture bottles   Culture   Final    NO GROWTH 4 DAYS Performed at [facility], [address], [place], [place] [address]     Report Status PENDING  Incomplete  Culture, blood (Routine X 2) w Reflex to ID Panel     Status: None (Preliminary result)   Collection Time: 06/24/22  8:55 AM   Specimen: BLOOD RIGHT HAND  Result Value Ref Range Status   Specimen Description   Final    BLOOD RIGHT HAND BOTTLES DRAWN AEROBIC AND ANAEROBIC   Special Requests   Final    Blood Culture results may not be optimal due to an excessive volume of blood received in culture bottles   Culture   Final    NO GROWTH 4 DAYS Performed at [facility], [address] [place], [place] [address]    Report Status PENDING  Incomplete    RADIOLOGY STUDIES/RESULTS: No results found.   LOS: 5 days   Oren Binet, MD  [facility]    To contact the attending provider between 7A-7P or the covering provider during after hours 7P-7A, please log into the web site [email or web address] and access using universal [place] password for that web site. If you do not have the password, please call the hospital operator.  06/28/2022, 10:32 AM

## 2022-06-28 NOTE — Progress Notes (Signed)
Patient ID: [name], male   DOB: 1923/07/13, 88 y.o.   MRN: [id number]    Progress Note from the Palliative Medicine Team at [facility]   Patient Name: Jordan [name]        Date: 06/28/2022 DOB: May 05, 1923  Age: 88 y.o. MRN#: [id number] Attending Physician: Jonetta Osgood, MD Primary Care Physician: Asencion Noble, MD Admit Date: 06/21/2022   Medical records reviewed, assessed patient at bedside discussed with Dr Sloan Leiter and bedside nursing staff    88 y.o.  male with history of aortic stenosis (not TAVR candidate) dementia( family report mild), HTN, BPH, DM-2, HLD, lymphocytic colitis-who presented to altered mental status.  Patient was transferred to [facility] for LTM EEG/LP/neurology evaluation.     Significant events: 12/05>> AP ED visit for weakness/falls/confusion.  Discharged home. 12/07>> admit to [facility] for worsening confusion 12/10>> transferred to [facility] for neurology/LTM EEG/possible LP-given ongoing confusion.   Significant studies: 12/05>> CT head: No acute intracranial abnormality 12/05>> CTA head/neck: No LVO, 75% stenosis of right ICA at the origin. 12/05>> MRI brain: No acute/subacute CVA show 12/05>> EEG: No seizures.   12/07>> CT head: No acute intracranial abnormality. 12/07>> TSH: 1.35 12/08>> EEG: No seizures. 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   Significant microbiology data: 12/07>> blood culture:Staph warneri (likely contamination) 12/09>> blood culture: No growth   This NP assessed patient at the bedside as a follow up for palliative medicine needs and emotional support. Patient  arouses easily to name and gentle touch. CBA reports him enjoying meals at 50%.  Unfortunately daughter dn I were unable to met in person.    Daughter describes overwhelmed  getting many things in place for  her father future needs and anticipatory care needs.   I spoke  with daughter/ [name] by phone.  She reflects on discussion with Dr Estell Harpin regarding possible treatment options and need for further workup.   She again shares an understanding for her father's best interest; she knows he would not want aggressive life prolonging measures;  "machines", artificial feeding.   Today she expresses no interest in LP or aggressive work-up.    Education offered on the difference between a full medical support/life prolonging path vs a palliative comfort path.  Education offered on hospice benefit in the home.  She hopes  to figure out a way to take her father home, "but will need help."  Discussed with Dr Rayburn Go   Education offered today regarding  the importance of continued conversation with the  medical providers regarding overall plan of care and treatment options,  ensuring decisions are within the context of the patients values and GOCs.  Daughter agrees to meet again tomorrow at 10:00 am   Questions and concerns addressed      109 minutes  Jordan Lessen NP  Palliative Medicine Team Team Phone # [phone] Pager [phone]

## 2022-06-29 DIAGNOSIS — R918 Other nonspecific abnormal finding of lung field: Secondary | ICD-10-CM

## 2022-06-29 DIAGNOSIS — E118 Type 2 diabetes mellitus with unspecified complications: Secondary | ICD-10-CM | POA: Diagnosis not present

## 2022-06-29 DIAGNOSIS — F03918 Unspecified dementia, unspecified severity, with other behavioral disturbance: Secondary | ICD-10-CM | POA: Diagnosis not present

## 2022-06-29 DIAGNOSIS — R531 Weakness: Secondary | ICD-10-CM | POA: Diagnosis not present

## 2022-06-29 DIAGNOSIS — G9341 Metabolic encephalopathy: Secondary | ICD-10-CM | POA: Diagnosis not present

## 2022-06-29 DIAGNOSIS — I10 Essential (primary) hypertension: Secondary | ICD-10-CM | POA: Diagnosis not present

## 2022-06-29 LAB — GLUCOSE, CAPILLARY
Glucose-Capillary: 149 mg/dL — ABNORMAL HIGH (ref 70–99)
Glucose-Capillary: 106 mg/dL — ABNORMAL HIGH (ref 70–99)
Glucose-Capillary: 200 mg/dL — ABNORMAL HIGH (ref 70–99)
Glucose-Capillary: 171 mg/dL — ABNORMAL HIGH (ref 70–99)

## 2022-06-29 LAB — CULTURE, BLOOD (ROUTINE X 2)
Culture: NO GROWTH
Culture: NO GROWTH

## 2022-06-29 NOTE — Progress Notes (Signed)
CSW spoke with patient's daughter [name] and Stanton Kidney, NP to discuss discharge plan. [name] states she is agreeable for short term rehab at discharge. [name] states the patient has never been to a facility in the past but would prefer [facility].  CSW completed FL2 and sent patient's clinical information out for review to obtain bed offers.  Madilyn Fireman, MSW, LCSW Transitions of Care  Clinical Social Worker II [phone]

## 2022-06-29 NOTE — Progress Notes (Signed)
Patient ID: Jordan [name], male   DOB: May 27, 1923, 88 y.o.   MRN: [id number]    Progress Note from the Palliative Medicine Team at [facility]   Patient Name: Jordan [name]        Date: 06/29/2022 DOB: 02-23-1923  Age: 88 y.o. MRN#: [id number] Attending Physician: Jonetta Osgood, MD Primary Care Physician: Asencion Noble, MD Admit Date: 06/21/2022   Medical records reviewed, assessed patient at bedside discussed with Dr Sloan Leiter and bedside nursing staff    88 y.o.  male with history of aortic stenosis (not TAVR candidate) dementia( family report mild), HTN, BPH, DM-2, HLD, lymphocytic colitis-who presented to altered mental status.  Patient was transferred to [facility] for LTM EEG/LP/neurology evaluation.     Significant events: 12/05>> AP ED visit for weakness/falls/confusion.  Discharged home. 12/07>> admit to [facility] for worsening confusion 12/10>> transferred to [facility] for neurology/LTM EEG/possible LP-given ongoing confusion.   Significant studies: 12/05>> CT head: No acute intracranial abnormality 12/05>> CTA head/neck: No LVO, 75% stenosis of right ICA at the origin. 12/05>> MRI brain: No acute/subacute CVA show 12/05>> EEG: No seizures.   12/07>> CT head: No acute intracranial abnormality. 12/07>> TSH: 1.35 12/08>> EEG: No seizures. 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   Significant microbiology data: 12/07>> blood culture:Staph warneri (likely contamination) 12/09>> blood culture: No growth   This NP assessed patient at the bedside as a follow up for palliative medicine needs and emotional support.   Patient is out of bed to the chair , eating 50% of his meals, arouses easily to verbal stimulation.  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     I  spoke  with daughter/ [name] at bedside.  Continued exploration and consideration of next steps in the plan of care.   She  again reiterates that she knows her father would not want aggressive life-prolonging measures such as ventilation or artificial feeding.  She hopes for comfort and dignity at this time in his life.    She expresses no interest in LP or aggressive work-up for underlying cancer.     Education offered on the difference between a full medical support/life prolonging path vs a palliative comfort path.  Education offered on hospice benefit in the home.  She hopes  to figure out a way to take her father home, "but will need help."  Plan of Care:      -DNR/DNI -no artificial feeding now or in the future -Transition to SNF for short-term rehab, with intention of ultimately returning home with daughter        Education offered on the likely trajectory of continued physical, functional and cognitive decline and the importance of preparation and consideration for anticipatory care needs in the home.   Education on self-referral for in-home hospice  Education offered today regarding  the importance of continued conversation with the medical providers regarding overall plan of care and treatment options,  ensuring decisions are within the context of the patients values and GOCs.   Questions and concerns addressed     50 minutes  Wadie Lessen NP  Palliative Medicine Team Team Phone # [phone] Pager [phone]

## 2022-06-29 NOTE — Progress Notes (Signed)
PROGRESS NOTE        PATIENT DETAILS Name: Jordan [name] Age: 88 y.o. Sex: male Date of Birth: 1923/04/24 Admit Date: 06/21/2022 Admitting Physician Florencia Reasons, MD ENI:[id number], [name], MD  Brief Summary: Patient is a 88 y.o.  male with history of aortic stenosis (not TAVR candidate) dementia, HTN, BPH, DM-2, HLD, lymphocytic colitis-who presented to altered mental status.  Patient was transferred to [facility] for LTM EEG/LP/neurology evaluation.  Workup in progress-see details below.  Significant events: 12/05>> [facility] ED visit for weakness/falls/confusion.  Discharged home. 12/07>> admit to [facility] for worsening confusion 12/10>> transferred to [facility] for neurology/LTM EEG/possible LP-given ongoing confusion.  Significant studies: 12/05>> CT head: No acute intracranial abnormality 12/05>> CTA head/neck: No LVO, 75% stenosis of right ICA at the origin. 12/05>> MRI brain: No acute/subacute CVA show 12/05>> EEG: No seizures.   12/07>> CT head: No acute intracranial abnormality. 12/07>> TSH: 1.35 12/08>> EEG: No seizures. 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  Significant microbiology data: 12/07>> blood culture:Staph warneri (likely contamination) 12/09>> blood culture: No growth  Procedures: None  Consults: Palliative care Neurology  Subjective: Much more awake/alert No major issues overnight.  Objective: Vitals: Blood pressure (!) 126/42, pulse 78, temperature 97.6 F (36.4 C), temperature source Oral, resp. rate 13, height 5\' 9"  (1.753 m), weight 58.4 kg, SpO2 97 %.   Exam: Gen Exam:not in any distress HEENT:atraumatic, normocephalic Chest: B/L clear to auscultation anteriorly CVS:S1S2 regular Abdomen:soft non tender, non distended Extremities:no edema Neurology: Non focal Skin: no  rash  Pertinent Labs/Radiology:    Latest Ref Rng & Units 06/24/2022    8:55 AM 06/23/2022    4:57 AM 06/22/2022    5:21 AM  CBC  WBC 4.0 - 10.5 K/uL 3.9  6.9  6.7   Hemoglobin 13.0 - 17.0 g/dL 10.0  11.2  10.3   Hematocrit 39.0 - 52.0 % 31.4  35.8  32.5   Platelets 150 - 400 K/uL 289  346  292     Lab Results  Component Value Date   NA 136 06/28/2022   K 3.8 06/28/2022   CL 104 06/28/2022   CO2 24 06/28/2022      Assessment/Plan: Acute metabolic encephalopathy Overall much better  Tolerating Seroquel/melatonin  Sitter at bedside  After extensive discussion over the past several days by this MD and palliative care-daughter does not desire LP (to rule out paraneoplastic encephalitis) and further workup.  Suspect this is likely delirium related to dementia-as noted in prior notes-patient appears to have a lung mass and paraneoplastic encephalitis is in the differential.  Staph Warneri bacteremia Likely contamination Repeat blood cultures negative No longer on Rocephin.  History of lymphocytic colitis On Entocort  HLD Continue statin  HTN BP stable-continue enalapril, metoprolol  Moderate to severe aortic stenosis Recently evaluated by structural cardiology-not a candidate for TAVR  DM-2 (A1c 7.2 on 12/07) CBGs stable on SSI  Recent Labs    06/28/22 1559 06/28/22 2133 06/29/22 0758  GLUCAP 117* 186* 149*     Left upper lobe lung mass-2.6 x 2.2 cm in size CT chest on 12/10-suspicious for indolent malignancy-likely adenocarcinoma After extensive goals of care discussion-no further workup planned.  Daughter agreeable.  BPH Continue Flomax/finasteride Frequent bladder scans to ensure no retention.  Dementia Has been trialed on Aricept several years ago and had side effects-has been maintained on Namenda for the past several years Per daughter on 12/11-this apparently was done for "short-term memory issues"-but suspect he has significant amount of dementia at  baseline.  Tried to explain to patient's daughter that at times when patients are at the familiar surroundings-it could mask the severity of her underlying dementia.  Severe failure to thrive syndrome Underlying dementia-delirium Continue to work with PT/OT/SLP Thankfully appetite has improved and is tolerating a fair amount of his meals.  Infrarenal abdominal aortic aneurysm Supportive care Doubt any benefit from surveillance in the outpatient at this point given advanced age.  Nutrition Status: Nutrition Problem: Inadequate oral intake Etiology: decreased appetite Signs/Symptoms: meal completion < 25% Interventions: Refer to RD note for recommendations  Underweight: Estimated body mass index is 19.01 kg/m as calculated from the following:   Height as of this encounter: 5\' 9"  (1.753 m).   Weight as of this encounter: 58.4 kg.   Code status:   Code Status: DNR   DVT Prophylaxis: heparin injection 5,000 Units Start: 06/22/22 0600 SCDs Start: 06/22/22 0509    Family Communication: Daugther [name] -[phone] on 12/14-  Disposition Plan: Status is: Inpatient Remains inpatient appropriate because: Severe encephalopathy   Planned Discharge Destination:Skilled nursing facility    Diet: Diet Order             DIET DYS 3 Room service appropriate? No; Fluid consistency: Thin  Diet effective now                     Antimicrobial agents: Anti-infectives (From admission, onward)    Start     Dose/Rate Route Frequency Ordered Stop   06/24/22 1345  vancomycin (VANCOREADY) IVPB 1250 mg/250 mL  Status:  Discontinued       See Hyperspace for full Linked Orders Report.   1,250 mg 166.7 mL/hr over 90 Minutes Intravenous Every 24 hours 06/23/22 1248 06/23/22 1250   06/24/22 1345  vancomycin (VANCOREADY) IVPB 1250 mg/250 mL  Status:  Discontinued       See Hyperspace for full Linked Orders Report.   1,250 mg 166.7 mL/hr over 90 Minutes Intravenous Every 24 hours 06/23/22  1250 06/23/22 1251   06/24/22 1345  vancomycin (VANCOREADY) IVPB 750 mg/150 mL  Status:  Discontinued       See Hyperspace for full Linked Orders Report.   750 mg 150 mL/hr over 60 Minutes Intravenous Every 24 hours 06/23/22 1251 06/23/22 1653   06/24/22 1200  cefTRIAXone (ROCEPHIN) 2 g in sodium chloride 0.9 % 100 mL IVPB  Status:  Discontinued        2 g 200 mL/hr over 30 Minutes Intravenous Every 24 hours 06/23/22 1653 06/25/22 0929   06/23/22 1345  vancomycin (VANCOREADY) IVPB 1250 mg/250 mL  Status:  Discontinued       See Hyperspace for full Linked Orders Report.   1,250 mg 166.7 mL/hr over 90 Minutes Intravenous  Once 06/23/22 1248 06/23/22 1250   06/23/22 1345  vancomycin (VANCOREADY) IVPB 750 mg/150 mL  Status:  Discontinued       See Hyperspace for full Linked Orders Report.   750 mg 150 mL/hr over 60 Minutes Intravenous  Once 06/23/22 1250 06/23/22 1251   06/23/22 1345  vancomycin (VANCOREADY) IVPB 1250 mg/250 mL       See Hyperspace for full Linked Orders Report.   1,250 mg 166.7 mL/hr over 90 Minutes Intravenous  Once 06/23/22  1251 06/23/22 1630        MEDICATIONS: Scheduled Meds:  aspirin EC  81 mg Oral Q breakfast   budesonide  3 mg Oral Daily   enalapril  10 mg Oral Daily   feeding supplement  237 mL Oral TID PC   finasteride  5 mg Oral Daily   heparin  5,000 Units Subcutaneous Q8H   insulin aspart  0-15 Units Subcutaneous TID WC   melatonin  5 mg Oral QHS   memantine  10 mg Oral BID   metoprolol tartrate  5 mg Intravenous Q8H   mirabegron ER  25 mg Oral Daily   multivitamin with minerals  1 tablet Oral Daily   pravastatin  80 mg Oral Daily   QUEtiapine  75 mg Oral QHS   tamsulosin  0.4 mg Oral QPC supper   Continuous Infusions:   PRN Meds:.acetaminophen **OR** acetaminophen, haloperidol lactate, hydrALAZINE, labetalol, ondansetron **OR** ondansetron (ZOFRAN) IV, mouth rinse   I have personally reviewed following labs and imaging studies  LABORATORY  DATA: CBC: Recent Labs  Lab 06/23/22 0457 06/24/22 0855  WBC 6.9 3.9*  NEUTROABS  --  2.5  HGB 11.2* 10.0*  HCT 35.8* 31.4*  MCV 93.2 91.5  PLT 346 289     Basic Metabolic Panel: Recent Labs  Lab 06/23/22 0457 06/24/22 0855 06/26/22 0423 06/28/22 0304  NA 144 141 139 136  K 5.3* 4.0 3.9 3.8  CL 110 110 107 104  CO2 25 22 21* 24  GLUCOSE 141* 96 97 139*  BUN 27* 27* 23 30*  CREATININE 1.10 1.12 1.06 1.05  CALCIUM 8.8* 8.3* 8.2* 8.1*     GFR: Estimated Creatinine Clearance: 33.2 mL/min (by C-G formula based on SCr of 1.05 mg/dL).  Liver Function Tests: Recent Labs  Lab 06/23/22 0457  AST 27  ALT 20  ALKPHOS 97  BILITOT 0.4  PROT 6.1*  ALBUMIN 3.1*    No results for input(s): "LIPASE", "AMYLASE" in the last 168 hours. Recent Labs  Lab 06/24/22 0855  AMMONIA 13     Coagulation Profile: No results for input(s): "INR", "PROTIME" in the last 168 hours.   Cardiac Enzymes: Recent Labs  Lab 06/23/22 0457  CKTOTAL 164     BNP (last 3 results) No results for input(s): "PROBNP" in the last 8760 hours.  Lipid Profile: No results for input(s): "CHOL", "HDL", "LDLCALC", "TRIG", "CHOLHDL", "LDLDIRECT" in the last 72 hours.  Thyroid Function Tests: No results for input(s): "TSH", "T4TOTAL", "FREET4", "T3FREE", "THYROIDAB" in the last 72 hours.  Anemia Panel: No results for input(s): "VITAMINB12", "FOLATE", "FERRITIN", "TIBC", "IRON", "RETICCTPCT" in the last 72 hours.   Urine analysis:    Component Value Date/Time   COLORURINE STRAW (A) 06/22/2022 0509   APPEARANCEUR CLEAR 06/22/2022 0509   APPEARANCEUR Clear 03/17/2022 1240   LABSPEC 1.008 06/22/2022 0509   PHURINE 7.0 06/22/2022 0509   GLUCOSEU NEGATIVE 06/22/2022 0509   HGBUR NEGATIVE 06/22/2022 0509   BILIRUBINUR NEGATIVE 06/22/2022 0509   BILIRUBINUR negative 04/13/2022 1525   BILIRUBINUR Negative 03/17/2022 1240   KETONESUR NEGATIVE 06/22/2022 0509   PROTEINUR NEGATIVE 06/22/2022 0509    UROBILINOGEN 1.0 04/13/2022 1525   NITRITE NEGATIVE 06/22/2022 0509   LEUKOCYTESUR NEGATIVE 06/22/2022 0509    Sepsis Labs: Lactic Acid, Venous No results found for: "LATICACIDVEN"  MICROBIOLOGY: Recent Results (from the past 240 hour(s))  Culture, blood (Routine X 2) w Reflex to ID Panel     Status: Abnormal   Collection Time: 06/22/22  5:03 PM   Specimen: Left Antecubital; Blood  Result Value Ref Range Status   Specimen Description   Final    LEFT ANTECUBITAL Performed at [facility], [address] [place], [place] [address]    Special Requests   Final    BOTTLES DRAWN AEROBIC AND ANAEROBIC Blood Culture adequate volume Performed at [facility], [address] [place], [place] [address]    Culture  Setup Time   Final    GRAM POSITIVE COCCI IN BOTH AEROBIC AND ANAEROBIC BOTTLES Gram Stain Report Called to,Read Back By and Verified With: COLEMAN @ 3220 ON [id number] BY HENDERSON L CRITICAL RESULT CALLED TO, READ BACK BY AND VERIFIED WITH: Naalehu ON 06/23/22 @ 1619 BY DRT    Culture (A)  Final    STAPHYLOCOCCUS WARNERI THE SIGNIFICANCE OF ISOLATING THIS ORGANISM FROM A SINGLE SET OF BLOOD CULTURES WHEN MULTIPLE SETS ARE DRAWN IS UNCERTAIN. PLEASE NOTIFY THE MICROBIOLOGY DEPARTMENT WITHIN ONE WEEK IF SPECIATION AND SENSITIVITIES ARE REQUIRED. Performed at [facility], [place] [address] [place], [place] [address]    Report Status 06/25/2022 FINAL  Final  Culture, blood (Routine X 2) w Reflex to ID Panel     Status: None   Collection Time: 06/22/22  5:03 PM   Specimen: Right Antecubital; Blood  Result Value Ref Range Status   Specimen Description RIGHT ANTECUBITAL  Final   Special Requests   Final    BOTTLES DRAWN AEROBIC AND ANAEROBIC Blood Culture adequate volume   Culture   Final    NO GROWTH 5 DAYS Performed at [facility], [address] [place], [place] [address]    Report Status 06/27/2022 FINAL  Final  Blood Culture ID Panel (Reflexed)     Status:  Abnormal   Collection Time: 06/22/22  5:03 PM  Result Value Ref Range Status   Enterococcus faecalis NOT DETECTED NOT DETECTED Final   Enterococcus Faecium NOT DETECTED NOT DETECTED Final   Listeria monocytogenes NOT DETECTED NOT DETECTED Final   Staphylococcus species DETECTED (A) NOT DETECTED Final    Comment: CRITICAL RESULT CALLED TO, READ BACK BY AND VERIFIED WITH: Crary ON 06/23/22 @ 1619 BY DRT    Staphylococcus aureus (BCID) NOT DETECTED NOT DETECTED Final   Staphylococcus epidermidis NOT DETECTED NOT DETECTED Final   Staphylococcus lugdunensis NOT DETECTED NOT DETECTED Final   Streptococcus species NOT DETECTED NOT DETECTED Final   Streptococcus agalactiae NOT DETECTED NOT DETECTED Final   Streptococcus pneumoniae NOT DETECTED NOT DETECTED Final   Streptococcus pyogenes NOT DETECTED NOT DETECTED Final   A.calcoaceticus-baumannii NOT DETECTED NOT DETECTED Final   Bacteroides fragilis NOT DETECTED NOT DETECTED Final   Enterobacterales NOT DETECTED NOT DETECTED Final   Enterobacter cloacae complex NOT DETECTED NOT DETECTED Final   Escherichia coli NOT DETECTED NOT DETECTED Final   Klebsiella aerogenes NOT DETECTED NOT DETECTED Final   Klebsiella oxytoca NOT DETECTED NOT DETECTED Final   Klebsiella pneumoniae NOT DETECTED NOT DETECTED Final   Proteus species NOT DETECTED NOT DETECTED Final   Salmonella species NOT DETECTED NOT DETECTED Final   Serratia marcescens NOT DETECTED NOT DETECTED Final   Haemophilus influenzae NOT DETECTED NOT DETECTED Final   Neisseria meningitidis NOT DETECTED NOT DETECTED Final   Pseudomonas aeruginosa NOT DETECTED NOT DETECTED Final   Stenotrophomonas maltophilia NOT DETECTED NOT DETECTED Final   Candida albicans NOT DETECTED NOT DETECTED Final   Candida auris NOT DETECTED NOT DETECTED Final   Candida glabrata NOT  DETECTED NOT DETECTED Final   Candida krusei NOT DETECTED NOT DETECTED Final   Candida parapsilosis NOT DETECTED NOT  DETECTED Final   Candida tropicalis NOT DETECTED NOT DETECTED Final   Cryptococcus neoformans/gattii NOT DETECTED NOT DETECTED Final    Comment: Performed at [facility], [place] [address], [place], [place] [address]  MRSA Next Gen by PCR, Nasal     Status: None   Collection Time: 06/23/22  5:27 PM   Specimen: Nasal Mucosa; Nasal Swab  Result Value Ref Range Status   MRSA by PCR Next Gen NOT DETECTED NOT DETECTED Final    Comment: (NOTE) The GeneXpert MRSA Assay (FDA approved for NASAL specimens only), is one component of a comprehensive MRSA colonization surveillance program. It is not intended to diagnose MRSA infection nor to guide or monitor treatment for MRSA infections. Test performance is not FDA approved in patients less than 106 years old. Performed at [facility], [address] [place], [place] [address]   Culture, blood (Routine X 2) w Reflex to ID Panel     Status: None   Collection Time: 06/24/22  8:55 AM   Specimen: Right Antecubital  Result Value Ref Range Status   Specimen Description   Final    RIGHT ANTECUBITAL BOTTLES DRAWN AEROBIC AND ANAEROBIC   Special Requests   Final    Blood Culture results may not be optimal due to an excessive volume of blood received in culture bottles   Culture   Final    NO GROWTH 5 DAYS Performed at [facility], [address] [place], [place] [address]    Report Status 06/29/2022 FINAL  Final  Culture, blood (Routine X 2) w Reflex to ID Panel     Status: None   Collection Time: 06/24/22  8:55 AM   Specimen: BLOOD RIGHT HAND  Result Value Ref Range Status   Specimen Description   Final    BLOOD RIGHT HAND BOTTLES DRAWN AEROBIC AND ANAEROBIC   Special Requests   Final    Blood Culture results may not be optimal due to an excessive volume of blood received in culture bottles   Culture   Final    NO GROWTH 5 DAYS Performed at [facility], [address] [place],  [address]    Report Status 06/29/2022 FINAL  Final     RADIOLOGY STUDIES/RESULTS: No results found.   LOS: 6 days   Oren Binet, MD  [facility]    To contact the attending provider between 7A-7P or the covering provider during after hours 7P-7A, please log into the web site [email or web address] and access using universal [place] password for that web site. If you do not have the password, please call the hospital operator.  06/29/2022, 11:57 AM

## 2022-06-29 NOTE — NC FL2 (Signed)
[facility] LEVEL OF CARE FORM     IDENTIFICATION  Patient Name: Jordan [name] Birthdate: Sep 06, 1922 Sex: male Admission Date (Current Location): 06/21/2022  [facility] and [place] Number:  [employer] and Address:  [facility] [place]. [facility], [place] [address], [place], [place] [address]      Provider Number: [id number]  Attending Physician Name and Address:  Jonetta Osgood, MD  Relative Name and Phone Number:       Current Level of Care: Hospital Recommended Level of Care: [place] Prior Approval Number:    Date Approved/Denied:   PASRR Number: [id number]  Discharge Plan: SNF    Current Diagnoses: Patient Active Problem List   Diagnosis Date Noted   Acute encephalopathy 78/46/9629   Acute metabolic encephalopathy 52/84/1324   Confusion with nonfocal neurological examination 06/20/2022   Dementia with behavioral disturbance ([place]) 06/20/2022   Lymphocytic colitis 10/13/2019   Benign prostatic hyperplasia with urinary obstruction 08/20/2019   Nocturia 08/20/2019   Pancreatitis 06/13/2016   Type 2 diabetes mellitus with complication ([place])    S/P drug eluting coronary stent placement    Hypertension    Hyperlipidemia    History of colonic polyps    Diverticulosis of colon without hemorrhage    Hemorrhoids 10/23/2014   Constipation 06/03/2012   Balance disorder/Fall at home 05/29/2012    Orientation RESPIRATION BLADDER Height & Weight     Self  Normal Continent, External catheter Weight: 128 lb 12 oz (58.4 kg) Height:  5\' 9"  (175.3 cm)  BEHAVIORAL SYMPTOMS/MOOD NEUROLOGICAL BOWEL NUTRITION STATUS      Continent Diet (See discharge summary)  AMBULATORY STATUS COMMUNICATION OF NEEDS Skin   Extensive Assist Verbally Normal, Skin abrasions                       Personal Care Assistance Level of Assistance  Bathing, Feeding, Dressing Bathing Assistance: Limited assistance Feeding assistance: Limited  assistance Dressing Assistance: Limited assistance     Functional Limitations Info  Sight, Hearing, Speech Sight Info: Adequate (Glasses) Hearing Info: Adequate Speech Info: Adequate    SPECIAL CARE FACTORS FREQUENCY  PT (By licensed PT), OT (By licensed OT)     PT Frequency: 5x weekly OT Frequency: 5x weekly            Contractures Contractures Info: Not present    Additional Factors Info  Allergies, Code Status Code Status Info: DNR Allergies Info: No known allergies           Current Medications (06/29/2022):  This is the current hospital active medication list Current Facility-Administered Medications  Medication Dose Route Frequency Provider Last Rate Last Admin   acetaminophen (TYLENOL) tablet 650 mg  650 mg Oral Q6H PRN Zierle-Ghosh, Asia B, DO   650 mg at 06/26/22 2053   Or   acetaminophen (TYLENOL) suppository 650 mg  650 mg Rectal Q6H PRN Zierle-Ghosh, Asia B, DO       aspirin EC tablet 81 mg  81 mg Oral Q breakfast Zierle-Ghosh, Asia B, DO   81 mg at 06/29/22 0937   budesonide (ENTOCORT EC) 24 hr capsule 3 mg  3 mg Oral Daily Zierle-Ghosh, Asia B, DO   3 mg at 06/29/22 0937   enalapril (VASOTEC) tablet 10 mg  10 mg Oral Daily Zierle-Ghosh, Asia B, DO   10 mg at 06/28/22 0904   feeding supplement (ENSURE ENLIVE / ENSURE PLUS) liquid 237 mL  237 mL Oral TID  PC Barb Merino, MD   237 mL at 06/29/22 0938   finasteride (PROSCAR) tablet 5 mg  5 mg Oral Daily Zierle-Ghosh, Asia B, DO   5 mg at 06/29/22 1006   haloperidol lactate (HALDOL) injection 2 mg  2 mg Intravenous Q6H PRN Jonetta Osgood, MD       heparin injection 5,000 Units  5,000 Units Subcutaneous Q8H Zierle-Ghosh, Asia B, DO   5,000 Units at 06/29/22 0517   hydrALAZINE (APRESOLINE) injection 5 mg  5 mg Intravenous Q8H PRN Florencia Reasons, MD       insulin aspart (novoLOG) injection 0-15 Units  0-15 Units Subcutaneous TID WC Zierle-Ghosh, Asia B, DO   2 Units at 06/29/22 0937   labetalol (NORMODYNE)  injection 5 mg  5 mg Intravenous Q2H PRN Florencia Reasons, MD       melatonin tablet 5 mg  5 mg Oral QHS Jonetta Osgood, MD   5 mg at 06/28/22 2027   memantine (NAMENDA) tablet 10 mg  10 mg Oral BID Zierle-Ghosh, Asia B, DO   10 mg at 06/29/22 0937   metoprolol tartrate (LOPRESSOR) injection 5 mg  5 mg Intravenous Blair Promise, MD   5 mg at 06/29/22 0521   mirabegron ER (MYRBETRIQ) tablet 25 mg  25 mg Oral Daily Zierle-Ghosh, Asia B, DO   25 mg at 06/29/22 1093   multivitamin with minerals tablet 1 tablet  1 tablet Oral Daily Florencia Reasons, MD   1 tablet at 06/29/22 0937   ondansetron (ZOFRAN) tablet 4 mg  4 mg Oral Q6H PRN Zierle-Ghosh, Asia B, DO       Or   ondansetron (ZOFRAN) injection 4 mg  4 mg Intravenous Q6H PRN Zierle-Ghosh, Asia B, DO       Oral care mouth rinse  15 mL Mouth Rinse PRN Florencia Reasons, MD       pravastatin (PRAVACHOL) tablet 80 mg  80 mg Oral Daily Zierle-Ghosh, Asia B, DO   80 mg at 06/29/22 2355   QUEtiapine (SEROQUEL) tablet 75 mg  75 mg Oral QHS Jonetta Osgood, MD   75 mg at 06/28/22 2028   tamsulosin (FLOMAX) capsule 0.4 mg  0.4 mg Oral QPC supper Zierle-Ghosh, Asia B, DO   0.4 mg at 06/28/22 1749     Discharge Medications: Please see discharge summary for a list of discharge medications.  Relevant Imaging Results:  Relevant Lab Results:   Additional Information SSN: [id number]  Archie Endo, LCSW

## 2022-06-29 NOTE — Progress Notes (Signed)
Physical Therapy Treatment Patient Details Name: Jordan [name] MRN: [id number] DOB: [birth year] Today's Date: 06/29/2022   History of Present Illness 88 yo male presents to [facility] on 12/5 with AMS, neuro and UA clear so d/c. Pt back to ED on 12/7 with continued AMS with combativeness. PMH includes HTN, DMII, dementia, HLD,  aortic stenosis, BPH, CAD, macular degeneration and is legally blind.    PT Comments    Pt with significant progression in mobility able to walk in hall with mod +2 assist. Pt with impaired vision and balance impacting his need for physical assist with all mobility and transfers. Pt could D/C home with HHPT if family feels comfortable providing needing physical assist vs ST-SNF. Pt will continue to benefit from increased mobility and activity acutely to return to PLOF.     Recommendations for follow up therapy are one component of a multi-disciplinary discharge planning process, led by the attending physician.  Recommendations may be updated based on patient status, additional functional criteria and insurance authorization.  Follow Up Recommendations  Skilled nursing-short term rehab (<3 hours/day) Can patient physically be transported by private vehicle: Yes   Assistance Recommended at Discharge Frequent or constant Supervision/Assistance  Patient can return home with the following Two people to help with walking and/or transfers;A lot of help with bathing/dressing/bathroom;Assist for transportation;Assistance with feeding;Direct supervision/assist for financial management;Direct supervision/assist for medications management;Assistance with cooking/housework   Equipment Recommendations  None recommended by PT    Recommendations for Other Services       Precautions / Restrictions Precautions Precautions: Fall     Mobility  Bed Mobility Overal bed mobility: Needs Assistance Bed Mobility: Supine to Sit     Supine to sit: Min assist     General bed  mobility comments: min assist to advance legs to EOB and initiate movement for transition to sitting. Cues for scooting to EOB and anterior translation    Transfers Overall transfer level: Needs assistance   Transfers: Sit to/from Stand Sit to Stand: Min assist           General transfer comment: min assist for anterior translation and rise with physical assist for balance.    Ambulation/Gait Ambulation/Gait assistance: Mod assist, +2 safety/equipment Gait Distance (Feet): 80 Feet Assistive device: Rolling walker (2 wheels) Gait Pattern/deviations: Step-through pattern, Decreased stride length, Narrow base of support   Gait velocity interpretation: <1.31 ft/sec, indicative of household ambulator   General Gait Details: very narrow BOS with pt stepping on his own feet repeatedly with cues for increased BOS, direction, safety and balance. Physical assist to guide and control RW as well as for balance with close chair follow.   Stairs             Wheelchair Mobility    Modified Rankin (Stroke Patients Only)       Balance Overall balance assessment: Needs assistance Sitting-balance support: Bilateral upper extremity supported, Feet supported Sitting balance-Leahy Scale: Poor Sitting balance - Comments: posterior bias with assist to correct   Standing balance support: Bilateral upper extremity supported, During functional activity, Reliant on assistive device for balance Standing balance-Leahy Scale: Poor Standing balance comment: mod assist with bil UE on RW                            Cognition Arousal/Alertness: Awake/alert Behavior During Therapy: WFL for tasks assessed/performed Overall Cognitive Status: No family/caregiver present to determine baseline cognitive functioning  General Comments: pt oriented to self, month and year. Initially stating he is in [place]. [place] but once educated for [employer] aware  of city as [place].        Exercises      General Comments        Pertinent Vitals/Pain Pain Assessment Pain Assessment: No/denies pain    Home Living                          Prior Function            PT Goals (current goals can now be found in the care plan section) Progress towards PT goals: Progressing toward goals    Frequency    Min 2X/week      PT Plan Current plan remains appropriate    Co-evaluation              AM-PAC PT "6 Clicks" Mobility   Outcome Measure  Help needed turning from your back to your side while in a flat bed without using bedrails?: A Little Help needed moving from lying on your back to sitting on the side of a flat bed without using bedrails?: A Little Help needed moving to and from a bed to a chair (including a wheelchair)?: A Lot Help needed standing up from a chair using your arms (e.g., wheelchair or bedside chair)?: A Little Help needed to walk in hospital room?: Total Help needed climbing 3-5 steps with a railing? : Total 6 Click Score: 13    End of Session Equipment Utilized During Treatment: Gait belt Activity Tolerance: Patient tolerated treatment well Patient left: in chair;with call bell/phone within reach;with nursing/sitter in room Nurse Communication: Mobility status PT Visit Diagnosis: Other abnormalities of gait and mobility (R26.89);Muscle weakness (generalized) (M62.81);Difficulty in walking, not elsewhere classified (R26.2)     Time: 3846-6599 PT Time Calculation (min) (ACUTE ONLY): 19 min  Charges:  $Gait Training: 8-22 mins                     Bayard Males, PT Acute Rehabilitation Services Office: [place] 06/29/2022, 8:39 AM

## 2022-06-29 NOTE — Progress Notes (Signed)
Occupational Therapy Treatment Patient Details Name: Jordan [name] MRN: [id number] DOB: 1922/08/17 Today's Date: 06/29/2022   History of present illness 88 yo male presents to [facility] on 12/5 with AMS, neuro and UA clear so d/c. Pt back to ED on 12/7 with continued AMS with combativeness. PMH includes HTN, DMII, dementia, HLD,  aortic stenosis, BPH, CAD, macular degeneration and is legally blind.   OT comments  Patient up in recliner upon entry and making good progress with self feeding. Patient able to use spoon to feed self with setup and verbal cues but requires assistance to load fork due to poor vision. Patient able to manage cup. Patient making good gains with standing with patient able to stand at sink for grooming tasks with mod assist for balance and to complete grooming tasks. Patient continues to be a good candidate for SNF due to level of care needed to continue progressing with OT. Acute OT to continue to follow.    Recommendations for follow up therapy are one component of a multi-disciplinary discharge planning process, led by the attending physician.  Recommendations may be updated based on patient status, additional functional criteria and insurance authorization.    Follow Up Recommendations  Skilled nursing-short term rehab (<3 hours/day)     Assistance Recommended at Discharge Frequent or constant Supervision/Assistance  Patient can return home with the following  Two people to help with walking and/or transfers;Assistance with cooking/housework;Assistance with feeding;Direct supervision/assist for medications management;Direct supervision/assist for financial management;Assist for transportation;Help with stairs or ramp for entrance;A lot of help with bathing/dressing/bathroom   Equipment Recommendations  Other (comment) (defer to next venue)    Recommendations for Other Services      Precautions / Restrictions Precautions Precautions: Fall Restrictions Weight  Bearing Restrictions: No       Mobility Bed Mobility Overal bed mobility: Needs Assistance             General bed mobility comments: OOB in recliner    Transfers Overall transfer level: Needs assistance Equipment used: None Transfers: Sit to/from Stand Sit to Stand: Min assist           General transfer comment: stood at sink for grooming with sink for support     Balance Overall balance assessment: Needs assistance Sitting-balance support: Feet supported Sitting balance-Leahy Scale: Poor Sitting balance - Comments: up in recliner Postural control: Posterior lean Standing balance support: Single extremity supported, Bilateral upper extremity supported, During functional activity Standing balance-Leahy Scale: Poor Standing balance comment: reliant on sink and therapist for balance while standing at sink for grooming due to posterior leaning                           ADL either performed or assessed with clinical judgement   ADL Overall ADL's : Needs assistance/impaired Eating/Feeding: Moderate assistance;Minimal assistance;Sitting Eating/Feeding Details (indicate cue type and reason): patient required assistance with self feeding using fork due to poor vision. Patient able to feed self with spoon Grooming: Moderate assistance;Sitting;Standing;Wash/dry hands;Wash/dry face;Oral care Grooming Details (indicate cue type and reason): Patient stood at sink for brushing teeth and completed washing hands and face while seated due to fatigue                               General ADL Comments: demonstated progress with self feeding and grooming    Extremity/Trunk Assessment  Vision       Perception     Praxis      Cognition Arousal/Alertness: Awake/alert Behavior During Therapy: WFL for tasks assessed/performed Overall Cognitive Status: No family/caregiver present to determine baseline cognitive functioning                                  General Comments: followed directions well with increased time        Exercises      Shoulder Instructions       General Comments      Pertinent Vitals/ Pain       Pain Assessment Pain Assessment: No/denies pain Pain Intervention(s): Monitored during session  Home Living                                          Prior Functioning/Environment              Frequency  Min 2X/week        Progress Toward Goals  OT Goals(current goals can now be found in the care plan section)  Progress towards OT goals: Progressing toward goals  Acute Rehab OT Goals Patient Stated Goal: get better OT Goal Formulation: With patient Time For Goal Achievement: 07/10/22 Potential to Achieve Goals: Fair ADL Goals Pt Will Perform Eating: with min assist;bed level Pt Will Transfer to Toilet: with max assist;stand pivot transfer;bedside commode Additional ADL Goal #1: Pt will complete bed mobility at mod A level to prepare for EOB/OOB ADLs. Additional ADL Goal #2: Pt will sit statically for 1 minute at min guard level to faciliate EOB ADLs.  Plan Discharge plan remains appropriate    Co-evaluation                 AM-PAC OT "6 Clicks" Daily Activity     Outcome Measure   Help from another person eating meals?: A Lot Help from another person taking care of personal grooming?: A Lot Help from another person toileting, which includes using toliet, bedpan, or urinal?: A Lot Help from another person bathing (including washing, rinsing, drying)?: A Lot Help from another person to put on and taking off regular upper body clothing?: A Lot Help from another person to put on and taking off regular lower body clothing?: Total 6 Click Score: 11    End of Session Equipment Utilized During Treatment: Gait belt  OT Visit Diagnosis: Unsteadiness on feet (R26.81);Muscle weakness (generalized) (M62.81);Adult, failure to thrive (R62.7);Other  symptoms and signs involving cognitive function;History of falling (Z91.81);Other abnormalities of gait and mobility (R26.89)   Activity Tolerance Patient tolerated treatment well   Patient Left in chair;with call bell/phone within reach;with nursing/sitter in room   Nurse Communication Mobility status        Time: 4383-8184 OT Time Calculation (min): 23 min  Charges: OT General Charges $OT Visit: 1 Visit OT Treatments $Self Care/Home Management : 23-37 mins  Lodema Hong, [place]  Office [place] 06/29/2022, 10:48 AM

## 2022-06-30 DIAGNOSIS — I10 Essential (primary) hypertension: Secondary | ICD-10-CM | POA: Diagnosis not present

## 2022-06-30 DIAGNOSIS — G9340 Encephalopathy, unspecified: Secondary | ICD-10-CM | POA: Diagnosis not present

## 2022-06-30 DIAGNOSIS — G9341 Metabolic encephalopathy: Secondary | ICD-10-CM | POA: Diagnosis not present

## 2022-06-30 DIAGNOSIS — F03918 Unspecified dementia, unspecified severity, with other behavioral disturbance: Secondary | ICD-10-CM | POA: Diagnosis not present

## 2022-06-30 LAB — GLUCOSE, CAPILLARY
Glucose-Capillary: 109 mg/dL — ABNORMAL HIGH (ref 70–99)
Glucose-Capillary: 219 mg/dL — ABNORMAL HIGH (ref 70–99)
Glucose-Capillary: 160 mg/dL — ABNORMAL HIGH (ref 70–99)
Glucose-Capillary: 152 mg/dL — ABNORMAL HIGH (ref 70–99)

## 2022-06-30 NOTE — Plan of Care (Signed)
  Neurology Plan of Care Note   Brief HPI: 89 year old patient with history of memory impairment, hypertension, hyperlipidemia, stent and diabetes initially presented with behavioral disturbance, confusion and left facial droop.  According to patient's daughter, he was alert and oriented several days prior to admission and was discussing the stock market.  He is normally able to perform his own ADLs.  He has been afebrile, no leukocytosis, no headache, no nuchal rigidity. No seizures on EEG.  Blood cultures negitive (initial result was likely contaminant with staphylococcus). Do not feel that CNS infection is cause. CT c/a/p shows lung mass and lymphedema, which has increased in size from prior and concerning for an indolent adenocarcinoma. Paraneoplastic process is certainly on the differential but given his advanced age and fraility, will need to clarify goals of care with daughter before further workup.   Interval History: Palliative following; their discussion with patient's daughter 12/14 led to a DNR status. Patients daughter expressed that she knows her father would not want aggressive life-prolonging measures such as ventilation or artificial feeding.  She hopes for comfort and dignity at this time in his life. She expresses no interest in LP or aggressive work-up for underlying cancer.   Neurology Plan: To comply with daughter's wishes of no further work-up including no further paraneoplastic testing, neurology will discontinue further interventions. We will sign off. Please call with any further questions or needs. Plan has been relayed to hospitalist.   Vivi Ferns, DNP, AGACNP-BC [facility] Please see AMION for pager information

## 2022-06-30 NOTE — Progress Notes (Addendum)
1:15pm: CSW spoke with patient's daughter [name] to discuss bed offer from [facility]. [name] to complete her research before making a decision.  11am: CSW attempted to reach patient's daughter [name] via phone without success - no voicemail option available so a text was sent requesting a return call.  Madilyn Fireman, MSW, LCSW Transitions of Care  Clinical Social Worker II [phone]

## 2022-06-30 NOTE — Progress Notes (Signed)
PROGRESS NOTE        PATIENT DETAILS Name: Jordan [name] Age: 88 y.o. Sex: male Date of Birth: 20-Apr-1923 Admit Date: 06/21/2022 Admitting Physician Florencia Reasons, MD WUX:[id number], [name], MD  Brief Summary: Patient is a 88 y.o.  male with history of aortic stenosis (not TAVR candidate) dementia, HTN, BPH, DM-2, HLD, lymphocytic colitis-who presented to altered mental status.  Patient was transferred to [facility] for LTM EEG/LP/neurology evaluation.  Workup in progress-see details below.  Significant events: 12/05>> [facility] ED visit for weakness/falls/confusion.  Discharged home. 12/07>> admit to [facility] for worsening confusion 12/10>> transferred to [facility] for neurology/LTM EEG/possible LP-given ongoing confusion.  Significant studies: 12/05>> CT head: No acute intracranial abnormality 12/05>> CTA head/neck: No LVO, 75% stenosis of right ICA at the origin. 12/05>> MRI brain: No acute/subacute CVA show 12/05>> EEG: No seizures.   12/07>> CT head: No acute intracranial abnormality. 12/07>> TSH: 1.35 12/08>> EEG: No seizures. 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  Significant microbiology data: 12/07>> blood culture:Staph warneri (likely contamination) 12/09>> blood culture: No growth  Procedures: None  Consults: Palliative care Neurology  Subjective: No major issues overnight-minimally confused-this morning-answering most of my questions appropriately.  Objective: Vitals: Blood pressure 125/67, pulse 73, temperature 97.7 F (36.5 C), temperature source Oral, resp. rate 14, height 5\' 9"  (1.753 m), weight 58.3 kg, SpO2 99 %.   Exam: Gen Exam:not in any distress HEENT:atraumatic, normocephalic Chest: B/L clear to auscultation anteriorly CVS:S1S2 regular Abdomen:soft non tender, non distended Extremities:no  edema Neurology: Non focal Skin: no rash  Pertinent Labs/Radiology:    Latest Ref Rng & Units 06/24/2022    8:55 AM 06/23/2022    4:57 AM 06/22/2022    5:21 AM  CBC  WBC 4.0 - 10.5 K/uL 3.9  6.9  6.7   Hemoglobin 13.0 - 17.0 g/dL 10.0  11.2  10.3   Hematocrit 39.0 - 52.0 % 31.4  35.8  32.5   Platelets 150 - 400 K/uL 289  346  292     Lab Results  Component Value Date   NA 136 06/28/2022   K 3.8 06/28/2022   CL 104 06/28/2022   CO2 24 06/28/2022      Assessment/Plan: Acute metabolic encephalopathy Overall much better  Tolerating Seroquel/melatonin  Sitter at bedside  After extensive discussion over the past several days by this MD and palliative care-daughter does not desire LP (to rule out paraneoplastic encephalitis) and further workup.  Suspect this is likely delirium related to dementia-as noted in prior notes-patient appears to have a lung mass and paraneoplastic encephalitis is in the differential.  Staph Warneri bacteremia Likely contamination Repeat blood cultures negative No longer on Rocephin.  History of lymphocytic colitis On Entocort  HLD Continue statin  HTN BP stable-continue enalapril, metoprolol  Moderate to severe aortic stenosis Recently evaluated by structural cardiology-not a candidate for TAVR  DM-2 (A1c 7.2 on 12/07) CBGs stable on SSI  Recent Labs    06/29/22 1646 06/29/22 1945 06/30/22 0753  GLUCAP 200* 171* 109*     Left upper lobe lung mass-2.6 x 2.2 cm in size CT chest on 12/10-suspicious for indolent malignancy-likely adenocarcinoma After extensive goals of care discussion-no further workup planned.  Daughter agreeable.  BPH Continue Flomax/finasteride Frequent bladder scans to  ensure no retention.  Dementia Has been trialed on Aricept several years ago and had side effects-has been maintained on Namenda for the past several years Per daughter on 12/11-this apparently was done for "short-term memory issues"-but suspect he  has significant amount of dementia at baseline.  Tried to explain to patient's daughter that at times when patients are at the familiar surroundings-it could mask the severity of her underlying dementia.  Severe failure to thrive syndrome Underlying dementia-delirium Continue to work with PT/OT/SLP Thankfully appetite has improved and is tolerating a fair amount of his meals.  Infrarenal abdominal aortic aneurysm Supportive care Doubt any benefit from surveillance in the outpatient at this point given advanced age.  Nutrition Status: Nutrition Problem: Inadequate oral intake Etiology: decreased appetite Signs/Symptoms: meal completion < 25% Interventions: Refer to RD note for recommendations  Underweight: Estimated body mass index is 18.98 kg/m as calculated from the following:   Height as of this encounter: 5\' 9"  (1.753 m).   Weight as of this encounter: 58.3 kg.   Code status:   Code Status: DNR   DVT Prophylaxis: heparin injection 5,000 Units Start: 06/22/22 0600 SCDs Start: 06/22/22 0509    Family Communication: Daugther [name] [phone] on 12/14-none at bedside today.  Disposition Plan: Status is: Inpatient Remains inpatient appropriate because: Severe encephalopathy   Planned Discharge Destination:Skilled nursing facility when bed available-stable for discharge.  Per social [employer] authorization.   Diet: Diet Order             DIET DYS 3 Room service appropriate? No; Fluid consistency: Thin  Diet effective now                     Antimicrobial agents: Anti-infectives (From admission, onward)    Start     Dose/Rate Route Frequency Ordered Stop   06/24/22 1345  vancomycin (VANCOREADY) IVPB 1250 mg/250 mL  Status:  Discontinued       See Hyperspace for full Linked Orders Report.   1,250 mg 166.7 mL/hr over 90 Minutes Intravenous Every 24 hours 06/23/22 1248 06/23/22 1250   06/24/22 1345  vancomycin (VANCOREADY) IVPB 1250 mg/250 mL   Status:  Discontinued       See Hyperspace for full Linked Orders Report.   1,250 mg 166.7 mL/hr over 90 Minutes Intravenous Every 24 hours 06/23/22 1250 06/23/22 1251   06/24/22 1345  vancomycin (VANCOREADY) IVPB 750 mg/150 mL  Status:  Discontinued       See Hyperspace for full Linked Orders Report.   750 mg 150 mL/hr over 60 Minutes Intravenous Every 24 hours 06/23/22 1251 06/23/22 1653   06/24/22 1200  cefTRIAXone (ROCEPHIN) 2 g in sodium chloride 0.9 % 100 mL IVPB  Status:  Discontinued        2 g 200 mL/hr over 30 Minutes Intravenous Every 24 hours 06/23/22 1653 06/25/22 0929   06/23/22 1345  vancomycin (VANCOREADY) IVPB 1250 mg/250 mL  Status:  Discontinued       See Hyperspace for full Linked Orders Report.   1,250 mg 166.7 mL/hr over 90 Minutes Intravenous  Once 06/23/22 1248 06/23/22 1250   06/23/22 1345  vancomycin (VANCOREADY) IVPB 750 mg/150 mL  Status:  Discontinued       See Hyperspace for full Linked Orders Report.   750 mg 150 mL/hr over 60 Minutes Intravenous  Once 06/23/22 1250 06/23/22 1251   06/23/22 1345  vancomycin (VANCOREADY) IVPB 1250 mg/250 mL       See Hyperspace for  full Linked Orders Report.   1,250 mg 166.7 mL/hr over 90 Minutes Intravenous  Once 06/23/22 1251 06/23/22 1630        MEDICATIONS: Scheduled Meds:  aspirin EC  81 mg Oral Q breakfast   budesonide  3 mg Oral Daily   enalapril  10 mg Oral Daily   feeding supplement  237 mL Oral TID PC   finasteride  5 mg Oral Daily   heparin  5,000 Units Subcutaneous Q8H   insulin aspart  0-15 Units Subcutaneous TID WC   melatonin  5 mg Oral QHS   memantine  10 mg Oral BID   metoprolol tartrate  5 mg Intravenous Q8H   mirabegron ER  25 mg Oral Daily   multivitamin with minerals  1 tablet Oral Daily   pravastatin  80 mg Oral Daily   QUEtiapine  75 mg Oral QHS   tamsulosin  0.4 mg Oral QPC supper   Continuous Infusions:   PRN Meds:.acetaminophen **OR** acetaminophen, haloperidol lactate,  hydrALAZINE, labetalol, ondansetron **OR** ondansetron (ZOFRAN) IV, mouth rinse   I have personally reviewed following labs and imaging studies  LABORATORY DATA: CBC: Recent Labs  Lab 06/24/22 0855  WBC 3.9*  NEUTROABS 2.5  HGB 10.0*  HCT 31.4*  MCV 91.5  PLT 289     Basic Metabolic Panel: Recent Labs  Lab 06/24/22 0855 06/26/22 0423 06/28/22 0304  NA 141 139 136  K 4.0 3.9 3.8  CL 110 107 104  CO2 22 21* 24  GLUCOSE 96 97 139*  BUN 27* 23 30*  CREATININE 1.12 1.06 1.05  CALCIUM 8.3* 8.2* 8.1*     GFR: Estimated Creatinine Clearance: 33.2 mL/min (by C-G formula based on SCr of 1.05 mg/dL).  Liver Function Tests: No results for input(s): "AST", "ALT", "ALKPHOS", "BILITOT", "PROT", "ALBUMIN" in the last 168 hours.  No results for input(s): "LIPASE", "AMYLASE" in the last 168 hours. Recent Labs  Lab 06/24/22 0855  AMMONIA 13     Coagulation Profile: No results for input(s): "INR", "PROTIME" in the last 168 hours.   Cardiac Enzymes: No results for input(s): "CKTOTAL", "CKMB", "CKMBINDEX", "TROPONINI" in the last 168 hours.   BNP (last 3 results) No results for input(s): "PROBNP" in the last 8760 hours.  Lipid Profile: No results for input(s): "CHOL", "HDL", "LDLCALC", "TRIG", "CHOLHDL", "LDLDIRECT" in the last 72 hours.  Thyroid Function Tests: No results for input(s): "TSH", "T4TOTAL", "FREET4", "T3FREE", "THYROIDAB" in the last 72 hours.  Anemia Panel: No results for input(s): "VITAMINB12", "FOLATE", "FERRITIN", "TIBC", "IRON", "RETICCTPCT" in the last 72 hours.   Urine analysis:    Component Value Date/Time   COLORURINE STRAW (A) 06/22/2022 0509   APPEARANCEUR CLEAR 06/22/2022 0509   APPEARANCEUR Clear 03/17/2022 1240   LABSPEC 1.008 06/22/2022 0509   PHURINE 7.0 06/22/2022 0509   GLUCOSEU NEGATIVE 06/22/2022 0509   HGBUR NEGATIVE 06/22/2022 0509   BILIRUBINUR NEGATIVE 06/22/2022 0509   BILIRUBINUR negative 04/13/2022 1525    BILIRUBINUR Negative 03/17/2022 1240   KETONESUR NEGATIVE 06/22/2022 0509   PROTEINUR NEGATIVE 06/22/2022 0509   UROBILINOGEN 1.0 04/13/2022 1525   NITRITE NEGATIVE 06/22/2022 0509   LEUKOCYTESUR NEGATIVE 06/22/2022 0509    Sepsis Labs: Lactic Acid, Venous No results found for: "LATICACIDVEN"  MICROBIOLOGY: Recent Results (from the past 240 hour(s))  Culture, blood (Routine X 2) w Reflex to ID Panel     Status: Abnormal   Collection Time: 06/22/22  5:03 PM   Specimen: Left Antecubital; Blood  Result Value Ref  Range Status   Specimen Description   Final    LEFT ANTECUBITAL Performed at [facility], [address], [place], [place] [address]    Special Requests   Final    BOTTLES DRAWN AEROBIC AND ANAEROBIC Blood Culture adequate volume Performed at [facility], [address], [place], [place] [address]    Culture  Setup Time   Final    GRAM POSITIVE COCCI IN BOTH AEROBIC AND ANAEROBIC BOTTLES Gram Stain Report Called to,Read Back By and Verified With: COLEMAN @ 1884 ON [id number] BY HENDERSON L CRITICAL RESULT CALLED TO, READ BACK BY AND VERIFIED WITH: PHARMD LAURIE POOLE ON 06/23/22 @ 1619 BY DRT    Culture (A)  Final    STAPHYLOCOCCUS WARNERI THE SIGNIFICANCE OF ISOLATING THIS ORGANISM FROM A SINGLE SET OF BLOOD CULTURES WHEN MULTIPLE SETS ARE DRAWN IS UNCERTAIN. PLEASE NOTIFY THE MICROBIOLOGY DEPARTMENT WITHIN ONE WEEK IF SPECIATION AND SENSITIVITIES ARE REQUIRED. Performed at [facility], [place] [address] [place], [place] [address]    Report Status 06/25/2022 FINAL  Final  Culture, blood (Routine X 2) w Reflex to ID Panel     Status: None   Collection Time: 06/22/22  5:03 PM   Specimen: Right Antecubital; Blood  Result Value Ref Range Status   Specimen Description RIGHT ANTECUBITAL  Final   Special Requests   Final    BOTTLES DRAWN AEROBIC AND ANAEROBIC Blood Culture adequate volume   Culture   Final    NO GROWTH 5 DAYS Performed at [facility], [address] [place], [place] [address]    Report Status 06/27/2022 FINAL  Final  Blood Culture ID Panel (Reflexed)     Status: Abnormal   Collection Time: 06/22/22  5:03 PM  Result Value Ref Range Status   Enterococcus faecalis NOT DETECTED NOT DETECTED Final   Enterococcus Faecium NOT DETECTED NOT DETECTED Final   Listeria monocytogenes NOT DETECTED NOT DETECTED Final   Staphylococcus species DETECTED (A) NOT DETECTED Final    Comment: CRITICAL RESULT CALLED TO, READ BACK BY AND VERIFIED WITH: [place] ON 06/23/22 @ 1619 BY DRT    Staphylococcus aureus (BCID) NOT DETECTED NOT DETECTED Final   Staphylococcus epidermidis NOT DETECTED NOT DETECTED Final   Staphylococcus lugdunensis NOT DETECTED NOT DETECTED Final   Streptococcus species NOT DETECTED NOT DETECTED Final   Streptococcus agalactiae NOT DETECTED NOT DETECTED Final   Streptococcus pneumoniae NOT DETECTED NOT DETECTED Final   Streptococcus pyogenes NOT DETECTED NOT DETECTED Final   A.calcoaceticus-baumannii NOT DETECTED NOT DETECTED Final   Bacteroides fragilis NOT DETECTED NOT DETECTED Final   Enterobacterales NOT DETECTED NOT DETECTED Final   Enterobacter cloacae complex NOT DETECTED NOT DETECTED Final   Escherichia coli NOT DETECTED NOT DETECTED Final   Klebsiella aerogenes NOT DETECTED NOT DETECTED Final   Klebsiella oxytoca NOT DETECTED NOT DETECTED Final   Klebsiella pneumoniae NOT DETECTED NOT DETECTED Final   Proteus species NOT DETECTED NOT DETECTED Final   Salmonella species NOT DETECTED NOT DETECTED Final   Serratia marcescens NOT DETECTED NOT DETECTED Final   Haemophilus influenzae NOT DETECTED NOT DETECTED Final   Neisseria meningitidis NOT DETECTED NOT DETECTED Final   Pseudomonas aeruginosa NOT DETECTED NOT DETECTED Final   Stenotrophomonas maltophilia NOT DETECTED NOT DETECTED Final   Candida albicans NOT DETECTED NOT DETECTED Final   Candida auris NOT DETECTED NOT DETECTED Final   Candida glabrata NOT  DETECTED NOT DETECTED Final   Candida krusei NOT DETECTED NOT  DETECTED Final   Candida parapsilosis NOT DETECTED NOT DETECTED Final   Candida tropicalis NOT DETECTED NOT DETECTED Final   Cryptococcus neoformans/gattii NOT DETECTED NOT DETECTED Final    Comment: Performed at [facility], [place] [address], [place], [place] [address]  MRSA Next Gen by PCR, Nasal     Status: None   Collection Time: 06/23/22  5:27 PM   Specimen: Nasal Mucosa; Nasal Swab  Result Value Ref Range Status   MRSA by PCR Next Gen NOT DETECTED NOT DETECTED Final    Comment: (NOTE) The GeneXpert MRSA Assay (FDA approved for NASAL specimens only), is one component of a comprehensive MRSA colonization surveillance program. It is not intended to diagnose MRSA infection nor to guide or monitor treatment for MRSA infections. Test performance is not FDA approved in patients less than 73 years old. Performed at [facility], [address] [place], [place] [address]   Culture, blood (Routine X 2) w Reflex to ID Panel     Status: None   Collection Time: 06/24/22  8:55 AM   Specimen: Right Antecubital  Result Value Ref Range Status   Specimen Description   Final    RIGHT ANTECUBITAL BOTTLES DRAWN AEROBIC AND ANAEROBIC   Special Requests   Final    Blood Culture results may not be optimal due to an excessive volume of blood received in culture bottles   Culture   Final    NO GROWTH 5 DAYS Performed at [facility], [address] [place], [place] [address]    Report Status 06/29/2022 FINAL  Final  Culture, blood (Routine X 2) w Reflex to ID Panel     Status: None   Collection Time: 06/24/22  8:55 AM   Specimen: BLOOD RIGHT HAND  Result Value Ref Range Status   Specimen Description   Final    BLOOD RIGHT HAND BOTTLES DRAWN AEROBIC AND ANAEROBIC   Special Requests   Final    Blood Culture results may not be optimal due to an excessive volume of blood received in culture bottles   Culture   Final    NO GROWTH 5  DAYS Performed at [facility], [address] [place], [place] [address]    Report Status 06/29/2022 FINAL  Final    RADIOLOGY STUDIES/RESULTS: No results found.   LOS: 7 days   Oren Binet, MD  [facility]    To contact the attending provider between 7A-7P or the covering provider during after hours 7P-7A, please log into the web site [email or web address] and access using universal [place] password for that web site. If you do not have the password, please call the hospital operator.  06/30/2022, 10:41 AM

## 2022-06-30 NOTE — Progress Notes (Signed)
Speech Language Pathology Treatment: Dysphagia  Patient Details Name: Zaiden Ludlum Loss MRN: [id number] DOB: 16-Feb-1923 Today's Date: 06/30/2022 Time: 5520-8022 SLP Time Calculation (min) (ACUTE ONLY): 13 min  Assessment / Plan / Recommendation Clinical Impression  Pt was seen during lunch meal with NT assisting with feeding. Pt continues to demonstrate prolonged mastication, but seems to take the time to chew the food and swallow before he will accept more. NT shares that this is consistent with breakfast meal today too, at which time he was much drowsier. Pt this afternoon is very alert, and responds to questions but otherwise is not very communicative. He seems appropriate to stay on Dys 3 diet and thin liquids with assist during meals. SLP to sign off.    HPI HPI: 88 y.o. male  with past medical history of mild cognitive dysfunction with short-term memory loss, aortic stenosis not a candidate for TAVR, BPH, coronary artery disease, hypertension, DM 2, diastolic dysfunction, bilateral hearing loss, macular degeneration, admitted on 06/21/2022 with altered mental status.  He has been agitated and combative for several days.  CT scan and MRI did not show any acute findings although there is advanced cerebral atrophy.  There was evidence of small chronic lacunar infarcts, and small left forehead hematoma.  CT angio of the head and neck showed 75% stenosis of his right ICA as well as a peripheral subsolid pulmonary opacity that has increased in size compared to 2017 and is suspicious for malignancy. No acute findings on Chest X-ray. BSE requested.      SLP Plan  All goals met      Recommendations for follow up therapy are one component of a multi-disciplinary discharge planning process, led by the attending physician.  Recommendations may be updated based on patient status, additional functional criteria and insurance authorization.    Recommendations  Diet recommendations: Dysphagia 3  (mechanical soft);Thin liquid Liquids provided via: Cup;Straw Medication Administration: Whole meds with liquid Supervision: Staff to assist with self feeding Compensations: Slow rate;Small sips/bites Postural Changes and/or Swallow Maneuvers: Seated upright 90 degrees                Oral Care Recommendations: Oral care BID Follow Up Recommendations: No SLP follow up Assistance recommended at discharge: Frequent or constant Supervision/Assistance SLP Visit Diagnosis: Dysphagia, unspecified (R13.10) Plan: All goals met           Osie Bond., M.A. [place] Office [phone]  Secure chat preferred   06/30/2022, 1:56 PM

## 2022-07-01 DIAGNOSIS — I10 Essential (primary) hypertension: Secondary | ICD-10-CM | POA: Diagnosis not present

## 2022-07-01 DIAGNOSIS — F03918 Unspecified dementia, unspecified severity, with other behavioral disturbance: Secondary | ICD-10-CM | POA: Diagnosis not present

## 2022-07-01 DIAGNOSIS — G9340 Encephalopathy, unspecified: Secondary | ICD-10-CM | POA: Diagnosis not present

## 2022-07-01 DIAGNOSIS — G9341 Metabolic encephalopathy: Secondary | ICD-10-CM | POA: Diagnosis not present

## 2022-07-01 LAB — GLUCOSE, CAPILLARY
Glucose-Capillary: 122 mg/dL — ABNORMAL HIGH (ref 70–99)
Glucose-Capillary: 170 mg/dL — ABNORMAL HIGH (ref 70–99)
Glucose-Capillary: 151 mg/dL — ABNORMAL HIGH (ref 70–99)
Glucose-Capillary: 155 mg/dL — ABNORMAL HIGH (ref 70–99)

## 2022-07-01 NOTE — Progress Notes (Signed)
PROGRESS NOTE        PATIENT DETAILS Name: Jordan [name] Age: 88 y.o. Sex: male Date of Birth: 1922-10-01 Admit Date: 06/21/2022 Admitting Physician Jordan Reasons, MD DJT:[id number], Jordan Manner, MD  Brief Summary: Patient is a 88 y.o.  male with history of aortic stenosis (not TAVR candidate) dementia, HTN, BPH, DM-2, HLD, lymphocytic colitis-who presented to altered mental status.  Patient was transferred to [facility] for LTM EEG/LP/neurology evaluation.  Workup in progress-see details below.  Significant events: 12/05>> [facility] ED visit for weakness/falls/confusion.  Discharged home. 12/07>> admit to [facility] for worsening confusion 12/10>> transferred to [facility] for neurology/LTM EEG/possible LP-given ongoing confusion. 12/14>> goals of care discussion with daughter by this MD/palliative care team-no aggressive care including LP.  Daughter requests SNF on discharge.  Significant studies: 12/05>> CT head: No acute intracranial abnormality 12/05>> CTA head/neck: No LVO, 75% stenosis of right ICA at the origin. 12/05>> MRI brain: No acute/subacute CVA show 12/05>> EEG: No seizures.   12/07>> CT head: No acute intracranial abnormality. 12/07>> TSH: 1.35 12/08>> EEG: No seizures. 12/08>> vitamin B12: 398 (normal) 12/09>> ammonia: 13 (normal) 12/09>> RPR: Nonreactive 12/10>> LTM EEG: No seizures 12/10>> CT chest/abdomen/pelvis: Concerning for indolent lung adenocarcinoma.  Significant microbiology data: 12/07>> blood culture:Staph warneri (likely contamination) 12/09>> blood culture: No growth  Procedures: None  Consults: Palliative care Neurology  Subjective: Pleasantly confused but following simple commands.  Mental status continues to slowly improve.  Per nursing staff-no major issues overnight.  Objective: Vitals: Blood pressure 131/76, pulse 81, temperature 97.8 F (36.6 C), temperature source Oral, resp. rate 16, height 5\' 9"  (1.753 m), weight 56.5 kg, SpO2 97 %.    Exam: Gen Exam:not in any distress HEENT:atraumatic, normocephalic Chest: B/L clear to auscultation anteriorly CVS:S1S2 regular Abdomen:soft non tender, non distended Extremities:no edema Neurology: Non focal Skin: no rash  Pertinent Labs/Radiology:    Latest Ref Rng & Units 06/24/2022    8:55 AM 06/23/2022    4:57 AM 06/22/2022    5:21 AM  CBC  WBC 4.0 - 10.5 K/uL 3.9  6.9  6.7   Hemoglobin 13.0 - 17.0 g/dL 10.0  11.2  10.3   Hematocrit 39.0 - 52.0 % 31.4  35.8  32.5   Platelets 150 - 400 K/uL 289  346  292     Lab Results  Component Value Date   NA 136 06/28/2022   K 3.8 06/28/2022   CL 104 06/28/2022   CO2 24 06/28/2022      Assessment/Plan: Acute metabolic encephalopathy Overall much better  Tolerating Seroquel/melatonin  Etiology unclear but suspect this is delirium related to dementia No significant electrolyte abnormalities/infection/medications identified as provoking factors No seizures seen on LTM EEG Only other differential at this point would be paraneoplastic encephalitis given lung mass-after extensive goals of care discussion with daughter-no LP-does not desire further workup. Continue delirium precautions.  Staph Warneri bacteremia Likely contamination Repeat blood cultures negative No longer on Rocephin.  History of lymphocytic colitis On Entocort  HLD Continue statin  HTN BP stable-continue enalapril, metoprolol  Moderate to severe aortic stenosis Recently evaluated by structural cardiology-not a candidate for TAVR  DM-2 (A1c 7.2 on 12/07) CBGs stable on SSI  Recent Labs    06/30/22 1645 06/30/22 2055 07/01/22 0817  GLUCAP 160* 152* 122*     Left upper lobe lung mass-2.6 x 2.2 cm in size  CT chest on 12/10-suspicious for indolent malignancy-likely adenocarcinoma After extensive goals of care discussion-no further workup planned.  BPH Continue Flomax/finasteride Frequent bladder scans to ensure no retention.  Dementia Has  been trialed on Aricept several years ago and had side effects-has been maintained on Namenda for the past several years Per daughter on 12/11-this apparently was done for "short-term memory issues"-but suspect he has significant amount of dementia at baseline.  Tried to explain to patient's daughter that at times when patients are at the familiar surroundings-it could mask the severity of her underlying dementia.  Severe failure to thrive syndrome Underlying dementia-delirium Continue to work with PT/OT/SLP Thankfully appetite has improved and is tolerating a fair amount of his meals.  Infrarenal abdominal aortic aneurysm Supportive care Doubt any benefit from surveillance in the outpatient at this point given advanced age.  Nutrition Status: Nutrition Problem: Inadequate oral intake Etiology: decreased appetite Signs/Symptoms: meal completion < 25% Interventions: Refer to RD note for recommendations  Underweight: Estimated body mass index is 18.39 kg/m as calculated from the following:   Height as of this encounter: 5\' 9"  (1.753 m).   Weight as of this encounter: 56.5 kg.   Code status:   Code Status: DNR   DVT Prophylaxis: heparin injection 5,000 Units Start: 06/22/22 0600 SCDs Start: 06/22/22 0509    Family Communication: Daugther Jordan [name] [phone] called on 12/16-voicemail full-unable to leave voicemail.    Disposition Plan: Status is: Inpatient Remains inpatient appropriate because: Severe encephalopathy   Planned Discharge Destination:Skilled nursing facility when bed available-stable for discharge.  Per social [employer] authorization.   Diet: Diet Order             DIET DYS 3 Room service appropriate? No; Fluid consistency: Thin  Diet effective now                     Antimicrobial agents: Anti-infectives (From admission, onward)    Start     Dose/Rate Route Frequency Ordered Stop   06/24/22 1345  vancomycin (VANCOREADY) IVPB 1250  mg/250 mL  Status:  Discontinued       See Hyperspace for full Linked Orders Report.   1,250 mg 166.7 mL/hr over 90 Minutes Intravenous Every 24 hours 06/23/22 1248 06/23/22 1250   06/24/22 1345  vancomycin (VANCOREADY) IVPB 1250 mg/250 mL  Status:  Discontinued       See Hyperspace for full Linked Orders Report.   1,250 mg 166.7 mL/hr over 90 Minutes Intravenous Every 24 hours 06/23/22 1250 06/23/22 1251   06/24/22 1345  vancomycin (VANCOREADY) IVPB 750 mg/150 mL  Status:  Discontinued       See Hyperspace for full Linked Orders Report.   750 mg 150 mL/hr over 60 Minutes Intravenous Every 24 hours 06/23/22 1251 06/23/22 1653   06/24/22 1200  cefTRIAXone (ROCEPHIN) 2 g in sodium chloride 0.9 % 100 mL IVPB  Status:  Discontinued        2 g 200 mL/hr over 30 Minutes Intravenous Every 24 hours 06/23/22 1653 06/25/22 0929   06/23/22 1345  vancomycin (VANCOREADY) IVPB 1250 mg/250 mL  Status:  Discontinued       See Hyperspace for full Linked Orders Report.   1,250 mg 166.7 mL/hr over 90 Minutes Intravenous  Once 06/23/22 1248 06/23/22 1250   06/23/22 1345  vancomycin (VANCOREADY) IVPB 750 mg/150 mL  Status:  Discontinued       See Hyperspace for full Linked Orders Report.   750 mg 150 mL/hr over  60 Minutes Intravenous  Once 06/23/22 1250 06/23/22 1251   06/23/22 1345  vancomycin (VANCOREADY) IVPB 1250 mg/250 mL       See Hyperspace for full Linked Orders Report.   1,250 mg 166.7 mL/hr over 90 Minutes Intravenous  Once 06/23/22 1251 06/23/22 1630        MEDICATIONS: Scheduled Meds:  aspirin EC  81 mg Oral Q breakfast   budesonide  3 mg Oral Daily   enalapril  10 mg Oral Daily   feeding supplement  237 mL Oral TID PC   finasteride  5 mg Oral Daily   heparin  5,000 Units Subcutaneous Q8H   insulin aspart  0-15 Units Subcutaneous TID WC   melatonin  5 mg Oral QHS   memantine  10 mg Oral BID   metoprolol tartrate  5 mg Intravenous Q8H   mirabegron ER  25 mg Oral Daily    multivitamin with minerals  1 tablet Oral Daily   pravastatin  80 mg Oral Daily   QUEtiapine  75 mg Oral QHS   tamsulosin  0.4 mg Oral QPC supper   Continuous Infusions:   PRN Meds:.acetaminophen **OR** acetaminophen, haloperidol lactate, hydrALAZINE, labetalol, ondansetron **OR** ondansetron (ZOFRAN) IV, mouth rinse   I have personally reviewed following labs and imaging studies  LABORATORY DATA: CBC: No results for input(s): "WBC", "NEUTROABS", "HGB", "HCT", "MCV", "PLT" in the last 168 hours.   Basic Metabolic Panel: Recent Labs  Lab 06/26/22 0423 06/28/22 0304  NA 139 136  K 3.9 3.8  CL 107 104  CO2 21* 24  GLUCOSE 97 139*  BUN 23 30*  CREATININE 1.06 1.05  CALCIUM 8.2* 8.1*     GFR: Estimated Creatinine Clearance: 32.1 mL/min (by C-G formula based on SCr of 1.05 mg/dL).  Liver Function Tests: No results for input(s): "AST", "ALT", "ALKPHOS", "BILITOT", "PROT", "ALBUMIN" in the last 168 hours.  No results for input(s): "LIPASE", "AMYLASE" in the last 168 hours. No results for input(s): "AMMONIA" in the last 168 hours.   Coagulation Profile: No results for input(s): "INR", "PROTIME" in the last 168 hours.   Cardiac Enzymes: No results for input(s): "CKTOTAL", "CKMB", "CKMBINDEX", "TROPONINI" in the last 168 hours.   BNP (last 3 results) No results for input(s): "PROBNP" in the last 8760 hours.  Lipid Profile: No results for input(s): "CHOL", "HDL", "LDLCALC", "TRIG", "CHOLHDL", "LDLDIRECT" in the last 72 hours.  Thyroid Function Tests: No results for input(s): "TSH", "T4TOTAL", "FREET4", "T3FREE", "THYROIDAB" in the last 72 hours.  Anemia Panel: No results for input(s): "VITAMINB12", "FOLATE", "FERRITIN", "TIBC", "IRON", "RETICCTPCT" in the last 72 hours.   Urine analysis:    Component Value Date/Time   COLORURINE STRAW (A) 06/22/2022 0509   APPEARANCEUR CLEAR 06/22/2022 0509   APPEARANCEUR Clear 03/17/2022 1240   LABSPEC 1.008 06/22/2022 0509    PHURINE 7.0 06/22/2022 0509   GLUCOSEU NEGATIVE 06/22/2022 0509   HGBUR NEGATIVE 06/22/2022 0509   BILIRUBINUR NEGATIVE 06/22/2022 0509   BILIRUBINUR negative 04/13/2022 1525   BILIRUBINUR Negative 03/17/2022 1240   KETONESUR NEGATIVE 06/22/2022 0509   PROTEINUR NEGATIVE 06/22/2022 0509   UROBILINOGEN 1.0 04/13/2022 1525   NITRITE NEGATIVE 06/22/2022 0509   LEUKOCYTESUR NEGATIVE 06/22/2022 0509    Sepsis Labs: Lactic Acid, Venous No results found for: "LATICACIDVEN"  MICROBIOLOGY: Recent Results (from the past 240 hour(s))  Culture, blood (Routine X 2) w Reflex to ID Panel     Status: Abnormal   Collection Time: 06/22/22  5:03 PM   Specimen:  Left Antecubital; Blood  Result Value Ref Range Status   Specimen Description   Final    LEFT ANTECUBITAL Performed at [facility], [address], [place], [place] [address]    Special Requests   Final    BOTTLES DRAWN AEROBIC AND ANAEROBIC Blood Culture adequate volume Performed at [facility], [address], [place], [place] [address]    Culture  Setup Time   Final    GRAM POSITIVE COCCI IN BOTH AEROBIC AND ANAEROBIC BOTTLES Gram Stain Report Called to,Read Back By and Verified With: COLEMAN @ [id number] ON [id number] BY HENDERSON L CRITICAL RESULT CALLED TO, READ BACK BY AND VERIFIED WITH: [place] ON 06/23/22 @ 1619 BY DRT    Culture (A)  Final    STAPHYLOCOCCUS WARNERI THE SIGNIFICANCE OF ISOLATING THIS ORGANISM FROM A SINGLE SET OF BLOOD CULTURES WHEN MULTIPLE SETS ARE DRAWN IS UNCERTAIN. PLEASE NOTIFY THE MICROBIOLOGY DEPARTMENT WITHIN ONE WEEK IF SPECIATION AND SENSITIVITIES ARE REQUIRED. Performed at [facility], [place] [address], [place], [place] [address]    Report Status 06/25/2022 FINAL  Final  Culture, blood (Routine X 2) w Reflex to ID Panel     Status: None   Collection Time: 06/22/22  5:03 PM   Specimen: Right Antecubital; Blood  Result Value Ref Range Status   Specimen Description RIGHT ANTECUBITAL  Final    Special Requests   Final    BOTTLES DRAWN AEROBIC AND ANAEROBIC Blood Culture adequate volume   Culture   Final    NO GROWTH 5 DAYS Performed at [facility], [address], [place], [place] [address]    Report Status 06/27/2022 FINAL  Final  Blood Culture ID Panel (Reflexed)     Status: Abnormal   Collection Time: 06/22/22  5:03 PM  Result Value Ref Range Status   Enterococcus faecalis NOT DETECTED NOT DETECTED Final   Enterococcus Faecium NOT DETECTED NOT DETECTED Final   Listeria monocytogenes NOT DETECTED NOT DETECTED Final   Staphylococcus species DETECTED (A) NOT DETECTED Final    Comment: CRITICAL RESULT CALLED TO, READ BACK BY AND VERIFIED WITH: Dawn ON 06/23/22 @ 1619 BY DRT    Staphylococcus aureus (BCID) NOT DETECTED NOT DETECTED Final   Staphylococcus epidermidis NOT DETECTED NOT DETECTED Final   Staphylococcus lugdunensis NOT DETECTED NOT DETECTED Final   Streptococcus species NOT DETECTED NOT DETECTED Final   Streptococcus agalactiae NOT DETECTED NOT DETECTED Final   Streptococcus pneumoniae NOT DETECTED NOT DETECTED Final   Streptococcus pyogenes NOT DETECTED NOT DETECTED Final   A.calcoaceticus-baumannii NOT DETECTED NOT DETECTED Final   Bacteroides fragilis NOT DETECTED NOT DETECTED Final   Enterobacterales NOT DETECTED NOT DETECTED Final   Enterobacter cloacae complex NOT DETECTED NOT DETECTED Final   Escherichia coli NOT DETECTED NOT DETECTED Final   Klebsiella aerogenes NOT DETECTED NOT DETECTED Final   Klebsiella oxytoca NOT DETECTED NOT DETECTED Final   Klebsiella pneumoniae NOT DETECTED NOT DETECTED Final   Proteus species NOT DETECTED NOT DETECTED Final   Salmonella species NOT DETECTED NOT DETECTED Final   Serratia marcescens NOT DETECTED NOT DETECTED Final   Haemophilus influenzae NOT DETECTED NOT DETECTED Final   Neisseria meningitidis NOT DETECTED NOT DETECTED Final   Pseudomonas aeruginosa NOT DETECTED NOT DETECTED Final    Stenotrophomonas maltophilia NOT DETECTED NOT DETECTED Final   Candida albicans NOT DETECTED NOT DETECTED Final   Candida auris NOT DETECTED NOT DETECTED Final   Candida glabrata NOT DETECTED NOT DETECTED Final  Candida krusei NOT DETECTED NOT DETECTED Final   Candida parapsilosis NOT DETECTED NOT DETECTED Final   Candida tropicalis NOT DETECTED NOT DETECTED Final   Cryptococcus neoformans/gattii NOT DETECTED NOT DETECTED Final    Comment: Performed at [facility], [place] [address] [place], [place] [address]  MRSA Next Gen by PCR, Nasal     Status: None   Collection Time: 06/23/22  5:27 PM   Specimen: Nasal Mucosa; Nasal Swab  Result Value Ref Range Status   MRSA by PCR Next Gen NOT DETECTED NOT DETECTED Final    Comment: (NOTE) The GeneXpert MRSA Assay (FDA approved for NASAL specimens only), is one component of a comprehensive MRSA colonization surveillance program. It is not intended to diagnose MRSA infection nor to guide or monitor treatment for MRSA infections. Test performance is not FDA approved in patients less than 50 years old. Performed at [facility], [address], [place], [place] [address]   Culture, blood (Routine X 2) w Reflex to ID Panel     Status: None   Collection Time: 06/24/22  8:55 AM   Specimen: Right Antecubital  Result Value Ref Range Status   Specimen Description   Final    RIGHT ANTECUBITAL BOTTLES DRAWN AEROBIC AND ANAEROBIC   Special Requests   Final    Blood Culture results may not be optimal due to an excessive volume of blood received in culture bottles   Culture   Final    NO GROWTH 5 DAYS Performed at [facility], [address] [place], [place] [address]    Report Status 06/29/2022 FINAL  Final  Culture, blood (Routine X 2) w Reflex to ID Panel     Status: None   Collection Time: 06/24/22  8:55 AM   Specimen: BLOOD RIGHT HAND  Result Value Ref Range Status   Specimen Description   Final    BLOOD RIGHT HAND BOTTLES DRAWN AEROBIC  AND ANAEROBIC   Special Requests   Final    Blood Culture results may not be optimal due to an excessive volume of blood received in culture bottles   Culture   Final    NO GROWTH 5 DAYS Performed at [facility], [address], [place],  [address]    Report Status 06/29/2022 FINAL  Final    RADIOLOGY STUDIES/RESULTS: No results found.   LOS: 8 days   Oren Binet, MD  [facility]    To contact the attending provider between 7A-7P or the covering provider during after hours 7P-7A, please log into the web site [email or web address] and access using universal [place] password for that web site. If you do not have the password, please call the hospital operator.  07/01/2022, 9:51 AM

## 2022-07-01 NOTE — Plan of Care (Signed)

## 2022-07-02 DIAGNOSIS — G9341 Metabolic encephalopathy: Secondary | ICD-10-CM | POA: Diagnosis not present

## 2022-07-02 LAB — GLUCOSE, CAPILLARY
Glucose-Capillary: 147 mg/dL — ABNORMAL HIGH (ref 70–99)
Glucose-Capillary: 142 mg/dL — ABNORMAL HIGH (ref 70–99)
Glucose-Capillary: 110 mg/dL — ABNORMAL HIGH (ref 70–99)

## 2022-07-02 NOTE — Progress Notes (Signed)
PROGRESS NOTE        PATIENT DETAILS Name: Jordan [name] Age: 88 y.o. Sex: male Date of Birth: 03-29-1923 Admit Date: 06/21/2022 Admitting Physician Florencia Reasons, MD JOI:NOMVE, [name], MD  Brief Summary: Patient is a 88 y.o.  male with history of aortic stenosis (not TAVR candidate) dementia, HTN, BPH, DM-2, HLD, lymphocytic colitis-who presented to altered mental status.  Patient was transferred to [facility] for LTM EEG/LP/neurology evaluation.  Workup in progress-see details below.  07/12/2022: As per collateral information, there are no plans to pursue lumbar puncture.  Patient is awaiting SNF placement.  Patient is needing one-to-one observation.  Significant events: 12/05>> [facility] ED visit for weakness/falls/confusion.  Discharged home. 12/07>> admit to [facility] for worsening confusion 12/10>> transferred to [facility] for neurology/LTM EEG/possible LP-given ongoing confusion. 12/14>> goals of care discussion with daughter by this MD/palliative care team-no aggressive care including LP.  Daughter requests SNF on discharge.  Significant studies: 12/05>> CT head: No acute intracranial abnormality 12/05>> CTA head/neck: No LVO, 75% stenosis of right ICA at the origin. 12/05>> MRI brain: No acute/subacute CVA show 12/05>> EEG: No seizures.   12/07>> CT head: No acute intracranial abnormality. 12/07>> TSH: 1.35 12/08>> EEG: No seizures. 12/08>> vitamin B12: 398 (normal) 12/09>> ammonia: 13 (normal) 12/09>> RPR: Nonreactive 12/10>> LTM EEG: No seizures 12/10>> CT chest/abdomen/pelvis: Concerning for indolent lung adenocarcinoma.  Significant microbiology data: 12/07>> blood culture:Staph warneri (likely contamination) 12/09>> blood culture: No growth  Procedures: None  Consults: Palliative care Neurology  Subjective: No history from patient. Objective: Vitals: Blood pressure (!) 108/54, pulse 83, temperature (!) 97.5 F (36.4 C), temperature source Oral, resp.  rate 18, height 5\' 9"  (1.753 m), weight 55.4 kg, SpO2 97 %.   Exam: Gen Exam: Patient is needing one-to-one observation.  Not in any distress.   HEENT: Patient is pale.   Chest: Clear to auscultation anteriorly.  CVS:S1S2 regular Abdomen:soft non tender, non distended CVS: S1-S2, with ejection systolic murmur. Extremities:no edema Neurology: No swelling extremities.  Patient is demented.  Pertinent Labs/Radiology:    Latest Ref Rng & Units 06/24/2022    8:55 AM 06/23/2022    4:57 AM 06/22/2022    5:21 AM  CBC  WBC 4.0 - 10.5 K/uL 3.9  6.9  6.7   Hemoglobin 13.0 - 17.0 g/dL 10.0  11.2  10.3   Hematocrit 39.0 - 52.0 % 31.4  35.8  32.5   Platelets 150 - 400 K/uL 289  346  292     Lab Results  Component Value Date   NA 136 06/28/2022   K 3.8 06/28/2022   CL 104 06/28/2022   CO2 24 06/28/2022      Assessment/Plan: Acute metabolic encephalopathy Overall much better  Tolerating Seroquel/melatonin  Etiology unclear but suspect this is delirium related to dementia No significant electrolyte abnormalities/infection/medications identified as provoking factors No seizures seen on LTM EEG Only other differential at this point would be paraneoplastic encephalitis given lung mass-after extensive goals of care discussion with daughter-no LP-does not desire further workup. Continue delirium precautions. 07/12/2022: Pursue disposition.  Staph Warneri bacteremia Likely contamination Repeat blood cultures negative No longer on Rocephin.  History of lymphocytic colitis On Entocort  HLD Continue statin  HTN BP stable-continue enalapril, metoprolol  Moderate to severe aortic stenosis Recently evaluated by structural cardiology-not a candidate for TAVR  DM-2 (A1c 7.2 on 12/07)  CBGs stable on SSI  Recent Labs    07/02/22 0739 07/02/22 1129 07/02/22 1601  GLUCAP 147* 142* 110*     Left upper lobe lung mass-2.6 x 2.2 cm in size CT chest on 12/10-suspicious for indolent  malignancy-likely adenocarcinoma After extensive goals of care discussion-no further workup planned.  BPH Continue Flomax/finasteride Frequent bladder scans to ensure no retention.  Dementia Has been trialed on Aricept several years ago and had side effects-has been maintained on Namenda for the past several years Per daughter on 12/11-this apparently was done for "short-term memory issues"-but suspect he has significant amount of dementia at baseline.  Tried to explain to patient's daughter that at times when patients are at the familiar surroundings-it could mask the severity of her underlying dementia.  Severe failure to thrive syndrome Underlying dementia-delirium Continue to work with PT/OT/SLP Thankfully appetite has improved and is tolerating a fair amount of his meals.  Infrarenal abdominal aortic aneurysm Supportive care Doubt any benefit from surveillance in the outpatient at this point given advanced age.  Nutrition Status: Nutrition Problem: Inadequate oral intake Etiology: decreased appetite Signs/Symptoms: meal completion < 25% Interventions: Refer to RD note for recommendations  Underweight: Estimated body mass index is 18.04 kg/m as calculated from the following:   Height as of this encounter: 5\' 9"  (1.753 m).   Weight as of this encounter: 55.4 kg.   Code status:   Code Status: DNR   DVT Prophylaxis: heparin injection 5,000 Units Start: 06/22/22 0600 SCDs Start: 06/22/22 0509    Family Communication: Daugther [name] [phone] called on 12/16-voicemail full-unable to leave voicemail.    Disposition Plan: Status is: Inpatient Remains inpatient appropriate because: Severe encephalopathy   Planned Discharge Destination:Skilled nursing facility when bed available-stable for discharge.  Per social [employer] authorization.   Diet: Diet Order             DIET DYS 3 Room service appropriate? No; Fluid consistency: Thin  Diet effective now                      Antimicrobial agents: Anti-infectives (From admission, onward)    Start     Dose/Rate Route Frequency Ordered Stop   06/24/22 1345  vancomycin (VANCOREADY) IVPB 1250 mg/250 mL  Status:  Discontinued       See Hyperspace for full Linked Orders Report.   1,250 mg 166.7 mL/hr over 90 Minutes Intravenous Every 24 hours 06/23/22 1248 06/23/22 1250   06/24/22 1345  vancomycin (VANCOREADY) IVPB 1250 mg/250 mL  Status:  Discontinued       See Hyperspace for full Linked Orders Report.   1,250 mg 166.7 mL/hr over 90 Minutes Intravenous Every 24 hours 06/23/22 1250 06/23/22 1251   06/24/22 1345  vancomycin (VANCOREADY) IVPB 750 mg/150 mL  Status:  Discontinued       See Hyperspace for full Linked Orders Report.   750 mg 150 mL/hr over 60 Minutes Intravenous Every 24 hours 06/23/22 1251 06/23/22 1653   06/24/22 1200  cefTRIAXone (ROCEPHIN) 2 g in sodium chloride 0.9 % 100 mL IVPB  Status:  Discontinued        2 g 200 mL/hr over 30 Minutes Intravenous Every 24 hours 06/23/22 1653 06/25/22 0929   06/23/22 1345  vancomycin (VANCOREADY) IVPB 1250 mg/250 mL  Status:  Discontinued       See Hyperspace for full Linked Orders Report.   1,250 mg 166.7 mL/hr over 90 Minutes Intravenous  Once 06/23/22 1248 06/23/22 1250  06/23/22 1345  vancomycin (VANCOREADY) IVPB 750 mg/150 mL  Status:  Discontinued       See Hyperspace for full Linked Orders Report.   750 mg 150 mL/hr over 60 Minutes Intravenous  Once 06/23/22 1250 06/23/22 1251   06/23/22 1345  vancomycin (VANCOREADY) IVPB 1250 mg/250 mL       See Hyperspace for full Linked Orders Report.   1,250 mg 166.7 mL/hr over 90 Minutes Intravenous  Once 06/23/22 1251 06/23/22 1630        MEDICATIONS: Scheduled Meds:  aspirin EC  81 mg Oral Q breakfast   budesonide  3 mg Oral Daily   enalapril  10 mg Oral Daily   feeding supplement  237 mL Oral TID PC   finasteride  5 mg Oral Daily   heparin  5,000 Units Subcutaneous Q8H    insulin aspart  0-15 Units Subcutaneous TID WC   melatonin  5 mg Oral QHS   memantine  10 mg Oral BID   metoprolol tartrate  5 mg Intravenous Q8H   mirabegron ER  25 mg Oral Daily   multivitamin with minerals  1 tablet Oral Daily   pravastatin  80 mg Oral Daily   QUEtiapine  75 mg Oral QHS   tamsulosin  0.4 mg Oral QPC supper   Continuous Infusions:   PRN Meds:.acetaminophen **OR** acetaminophen, haloperidol lactate, hydrALAZINE, labetalol, ondansetron **OR** ondansetron (ZOFRAN) IV, mouth rinse   I have personally reviewed following labs and imaging studies  LABORATORY DATA: CBC: No results for input(s): "WBC", "NEUTROABS", "HGB", "HCT", "MCV", "PLT" in the last 168 hours.   Basic Metabolic Panel: Recent Labs  Lab 06/26/22 0423 06/28/22 0304  NA 139 136  K 3.9 3.8  CL 107 104  CO2 21* 24  GLUCOSE 97 139*  BUN 23 30*  CREATININE 1.06 1.05  CALCIUM 8.2* 8.1*     GFR: Estimated Creatinine Clearance: 31.5 mL/min (by C-G formula based on SCr of 1.05 mg/dL).  Liver Function Tests: No results for input(s): "AST", "ALT", "ALKPHOS", "BILITOT", "PROT", "ALBUMIN" in the last 168 hours.  No results for input(s): "LIPASE", "AMYLASE" in the last 168 hours. No results for input(s): "AMMONIA" in the last 168 hours.   Coagulation Profile: No results for input(s): "INR", "PROTIME" in the last 168 hours.   Cardiac Enzymes: No results for input(s): "CKTOTAL", "CKMB", "CKMBINDEX", "TROPONINI" in the last 168 hours.   BNP (last 3 results) No results for input(s): "PROBNP" in the last 8760 hours.  Lipid Profile: No results for input(s): "CHOL", "HDL", "LDLCALC", "TRIG", "CHOLHDL", "LDLDIRECT" in the last 72 hours.  Thyroid Function Tests: No results for input(s): "TSH", "T4TOTAL", "FREET4", "T3FREE", "THYROIDAB" in the last 72 hours.  Anemia Panel: No results for input(s): "VITAMINB12", "FOLATE", "FERRITIN", "TIBC", "IRON", "RETICCTPCT" in the last 72 hours.   Urine  analysis:    Component Value Date/Time   COLORURINE STRAW (A) 06/22/2022 0509   APPEARANCEUR CLEAR 06/22/2022 0509   APPEARANCEUR Clear 03/17/2022 1240   LABSPEC 1.008 06/22/2022 0509   PHURINE 7.0 06/22/2022 0509   GLUCOSEU NEGATIVE 06/22/2022 0509   HGBUR NEGATIVE 06/22/2022 0509   BILIRUBINUR NEGATIVE 06/22/2022 0509   BILIRUBINUR negative 04/13/2022 1525   BILIRUBINUR Negative 03/17/2022 1240   KETONESUR NEGATIVE 06/22/2022 0509   PROTEINUR NEGATIVE 06/22/2022 0509   UROBILINOGEN 1.0 04/13/2022 1525   NITRITE NEGATIVE 06/22/2022 0509   LEUKOCYTESUR NEGATIVE 06/22/2022 0509    Sepsis Labs: Lactic Acid, Venous No results found for: "LATICACIDVEN"  MICROBIOLOGY: Recent Results (  from the past 240 hour(s))  MRSA Next Gen by PCR, Nasal     Status: None   Collection Time: 06/23/22  5:27 PM   Specimen: Nasal Mucosa; Nasal Swab  Result Value Ref Range Status   MRSA by PCR Next Gen NOT DETECTED NOT DETECTED Final    Comment: (NOTE) The GeneXpert MRSA Assay (FDA approved for NASAL specimens only), is one component of a comprehensive MRSA colonization surveillance program. It is not intended to diagnose MRSA infection nor to guide or monitor treatment for MRSA infections. Test performance is not FDA approved in patients less than 86 years old. Performed at [facility], [address] [place], [place] [address]   Culture, blood (Routine X 2) w Reflex to ID Panel     Status: None   Collection Time: 06/24/22  8:55 AM   Specimen: Right Antecubital  Result Value Ref Range Status   Specimen Description   Final    RIGHT ANTECUBITAL BOTTLES DRAWN AEROBIC AND ANAEROBIC   Special Requests   Final    Blood Culture results may not be optimal due to an excessive volume of blood received in culture bottles   Culture   Final    NO GROWTH 5 DAYS Performed at [facility], [address], [place], [place] [address]    Report Status 06/29/2022 FINAL  Final  Culture, blood (Routine X 2) w  Reflex to ID Panel     Status: None   Collection Time: 06/24/22  8:55 AM   Specimen: BLOOD RIGHT HAND  Result Value Ref Range Status   Specimen Description   Final    BLOOD RIGHT HAND BOTTLES DRAWN AEROBIC AND ANAEROBIC   Special Requests   Final    Blood Culture results may not be optimal due to an excessive volume of blood received in culture bottles   Culture   Final    NO GROWTH 5 DAYS Performed at [facility], [address] [place],  [address]    Report Status 06/29/2022 FINAL  Final    RADIOLOGY STUDIES/RESULTS: No results found.   LOS: 9 days   Bonnell Public, MD  [facility]    To contact the attending provider between 7A-7P or the covering provider during after hours 7P-7A, please log into the web site [email or web address] and access using universal [place] password for that web site. If you do not have the password, please call the hospital operator.  07/02/2022, 5:10 PM

## 2022-07-03 DIAGNOSIS — G9341 Metabolic encephalopathy: Secondary | ICD-10-CM | POA: Diagnosis not present

## 2022-07-03 LAB — GLUCOSE, CAPILLARY
Glucose-Capillary: 125 mg/dL — ABNORMAL HIGH (ref 70–99)
Glucose-Capillary: 172 mg/dL — ABNORMAL HIGH (ref 70–99)
Glucose-Capillary: 159 mg/dL — ABNORMAL HIGH (ref 70–99)

## 2022-07-03 MED ORDER — METOPROLOL TARTRATE 12.5 MG HALF TABLET
12.5000 mg | ORAL_TABLET | Freq: Two times a day (BID) | ORAL | Status: DC
  Administered 2022-07-03 – 2022-07-06 (×6): 12.5 mg via ORAL
  Filled 2022-07-03 (×6): qty 1

## 2022-07-03 NOTE — Progress Notes (Signed)
PROGRESS NOTE    Jordan [name]  IZT:[id number] DOB: [birth year] DOA: 06/21/2022 PCP: Asencion Noble, MD   Brief Narrative:  This 88 y.o.  male with PMH significant for Aortic stenosis (not TAVR candidate) dementia, HTN, BPH, DM-2, HLD, lymphocytic colitis-who presented to altered mental status.  Patient was transferred to [facility] for LTM EEG/LP/ Neurology evaluation. As per family, there are no plans to pursue LP.  Patient is awaiting SNF placement.  Patient is needing one-to-one observation.  Significant events: 12/05>> AP ED visit for weakness/falls/confusion.  Discharged home. 12/07>> Admit to [facility] for worsening confusion 12/10>> transferred to [facility] for neurology/LTM EEG/possible LP-given ongoing confusion. 12/14>> goals of care discussion with daughter by  MD/palliative care team-no aggressive care including LP.  Daughter requests SNF on discharge.   Significant studies: 12/05>> CT head: No acute intracranial abnormality 12/05>> CTA head/neck: No LVO, 75% stenosis of right ICA at the origin. 12/05>> MRI brain: No acute/subacute CVA show 12/05>> EEG: No seizures.   12/07>> CT head: No acute intracranial abnormality. 12/07>> TSH: 1.35 12/08>> EEG: No seizures. 12/08>> vitamin B12: 398 (normal) 12/09>> ammonia: 13 (normal) 12/09>> RPR: Nonreactive 12/10>> LTM EEG: No seizures 12/10>> CT chest/abdomen/pelvis: Concerning for indolent lung adenocarcinoma.   Significant microbiology data: 12/07>> blood culture:Staph warneri (likely contamination) 12/09>> blood culture: No growth   Assessment & Plan:   Principal Problem:   Acute metabolic encephalopathy Active Problems:   Dementia with behavioral disturbance (HCC)   Type 2 diabetes mellitus with complication (HCC)   Hypertension   Hyperlipidemia   Acute encephalopathy  Acute metabolic encephalopathy: Overall much improved. Tolerating Seroquel / Melatonin. Etiology unclear but suspect this is delirium related to dementia. No  significant electrolyte abnormalities / Infection/ Medications identified as provoking factors No seizures seen on LTM EEG Only other differential at this point would be paraneoplastic encephalitis given lung mass-after extensive goals of care discussion with daughter- No LP- does not desire further workup. Continue delirium precautions. TOC notified for placement.  Staph Werneri  bacteremia Likely contamination. Repeat blood cultures negative. Antibiotics discontinued.   History of lymphocytic colitis Continue Entocort   Hyperlipidemia: Continue statin   Essential HTN: Enalapril, metoprolol. Blood pressure stable.   Moderate to severe aortic stenosis: Recently evaluated by structural cardiology Not a candidate for TAVR   DM II: Hb A1c 7.2  Continue  RISS.    Left upper lobe lung mass: CT chest on 12/10-suspicious for indolent malignancy-likely adenocarcinoma After extensive goals of care discussion > No further workup planned.   BPH: Continue Flomax/finasteride Frequent bladder scans to ensure no retention.   Dementia: He was tried on Aricept several years ago but had side effects-has been maintained on Namenda for the past several years Per daughter on 12/11-this apparently was done for "short-term memory issues"-but suspect he has significant amount of dementia at baseline.  Tried to explain to patient's daughter that at times when patients are at the familiar surroundings - it could mask the severity of her underlying dementia.   Failure to Thrive syndrome: Underlying dementia-delirium Continue to work with PT/OT/SLP Now appetite has improved and is tolerating a fair amount of his meals.   Infrarenal abdominal aortic aneurysm: Continue supportive care.  Doubt any benefit from surveillance in the outpatient at this point given advanced age.   Nutrition Status: Nutrition Problem: Inadequate oral intake Etiology: decreased appetite Signs/Symptoms: meal completion  < 25% Interventions: Refer to RD note for recommendations   Underweight: Estimated body mass index is 18.04 kg/m as calculated  from the following:   Height as of this encounter: 5\' 9"  (1.753 m).   Weight as of this encounter: 55.4 kg.     DVT prophylaxis: Heparin sq Code Status: DNR Family Communication: No family at bed side. Disposition Plan:    Status is: Inpatient Remains inpatient appropriate because: Severe encephalopathy.   TOC working on finding SNF.  Consultants:  None  Procedures:None  Antimicrobials:   Anti-infectives (From admission, onward)    Start     Dose/Rate Route Frequency Ordered Stop   06/24/22 1345  vancomycin (VANCOREADY) IVPB 1250 mg/250 mL  Status:  Discontinued       See Hyperspace for full Linked Orders Report.   1,250 mg 166.7 mL/hr over 90 Minutes Intravenous Every 24 hours 06/23/22 1248 06/23/22 1250   06/24/22 1345  vancomycin (VANCOREADY) IVPB 1250 mg/250 mL  Status:  Discontinued       See Hyperspace for full Linked Orders Report.   1,250 mg 166.7 mL/hr over 90 Minutes Intravenous Every 24 hours 06/23/22 1250 06/23/22 1251   06/24/22 1345  vancomycin (VANCOREADY) IVPB 750 mg/150 mL  Status:  Discontinued       See Hyperspace for full Linked Orders Report.   750 mg 150 mL/hr over 60 Minutes Intravenous Every 24 hours 06/23/22 1251 06/23/22 1653   06/24/22 1200  cefTRIAXone (ROCEPHIN) 2 g in sodium chloride 0.9 % 100 mL IVPB  Status:  Discontinued        2 g 200 mL/hr over 30 Minutes Intravenous Every 24 hours 06/23/22 1653 06/25/22 0929   06/23/22 1345  vancomycin (VANCOREADY) IVPB 1250 mg/250 mL  Status:  Discontinued       See Hyperspace for full Linked Orders Report.   1,250 mg 166.7 mL/hr over 90 Minutes Intravenous  Once 06/23/22 1248 06/23/22 1250   06/23/22 1345  vancomycin (VANCOREADY) IVPB 750 mg/150 mL  Status:  Discontinued       See Hyperspace for full Linked Orders Report.   750 mg 150 mL/hr over 60 Minutes Intravenous   Once 06/23/22 1250 06/23/22 1251   06/23/22 1345  vancomycin (VANCOREADY) IVPB 1250 mg/250 mL       See Hyperspace for full Linked Orders Report.   1,250 mg 166.7 mL/hr over 90 Minutes Intravenous  Once 06/23/22 1251 06/23/22 1630       Subjective: Patient was seen and examined at bedside.  Overnight events noted. Patient was sitting comfortably on the chair,  states he is doing better,  denies any pain.  Objective: Vitals:   07/02/22 2229 07/03/22 0329 07/03/22 0842 07/03/22 1052  BP: (!) 149/64 123/69 (!) 93/47 117/69  Pulse: 78 81 86 77  Resp: 18 19 16    Temp: 98.4 F (36.9 C) (!) 97.5 F (36.4 C) 97.8 F (36.6 C)   TempSrc: Oral Oral Oral   SpO2: 97% 97% 97%   Weight:      Height:        Intake/Output Summary (Last 24 hours) at 07/03/2022 1404 Last data filed at 07/03/2022 0641 Gross per 24 hour  Intake 120 ml  Output 750 ml  Net -630 ml   Filed Weights   06/30/22 0500 07/01/22 0500 07/02/22 0407  Weight: 58.3 kg 56.5 kg 55.4 kg    Examination:  General exam: Appears comfortable, chronically ill looking, deconditioned. Respiratory system: CTA bilaterally, respiratory effort normal, no accessory muscle use, RR 15 Cardiovascular system: S1 & S2 heard, regular rate and rhythm, murmur+ Gastrointestinal system: Abdomen is soft, non  tender, nondistended, BS+ Central nervous system: Alert and oriented x 2. No focal neurological deficits. Extremities: No edema, no cyanosis, no clubbing Skin: No rashes, lesions or ulcers Psychiatry:  Mood & affect appropriate.     Data Reviewed: I have personally reviewed following labs and imaging studies  CBC: No results for input(s): "WBC", "NEUTROABS", "HGB", "HCT", "MCV", "PLT" in the last 168 hours. Basic Metabolic Panel: Recent Labs  Lab 06/28/22 0304  NA 136  K 3.8  CL 104  CO2 24  GLUCOSE 139*  BUN 30*  CREATININE 1.05  CALCIUM 8.1*   GFR: Estimated Creatinine Clearance: 31.5 mL/min (by C-G formula based on  SCr of 1.05 mg/dL). Liver Function Tests: No results for input(s): "AST", "ALT", "ALKPHOS", "BILITOT", "PROT", "ALBUMIN" in the last 168 hours. No results for input(s): "LIPASE", "AMYLASE" in the last 168 hours. No results for input(s): "AMMONIA" in the last 168 hours. Coagulation Profile: No results for input(s): "INR", "PROTIME" in the last 168 hours. Cardiac Enzymes: No results for input(s): "CKTOTAL", "CKMB", "CKMBINDEX", "TROPONINI" in the last 168 hours. BNP (last 3 results) No results for input(s): "PROBNP" in the last 8760 hours. HbA1C: No results for input(s): "HGBA1C" in the last 72 hours. CBG: Recent Labs  Lab 07/02/22 0739 07/02/22 1129 07/02/22 1601 07/03/22 0838 07/03/22 1137  GLUCAP 147* 142* 110* 125* 172*   Lipid Profile: No results for input(s): "CHOL", "HDL", "LDLCALC", "TRIG", "CHOLHDL", "LDLDIRECT" in the last 72 hours. Thyroid Function Tests: No results for input(s): "TSH", "T4TOTAL", "FREET4", "T3FREE", "THYROIDAB" in the last 72 hours. Anemia Panel: No results for input(s): "VITAMINB12", "FOLATE", "FERRITIN", "TIBC", "IRON", "RETICCTPCT" in the last 72 hours. Sepsis Labs: No results for input(s): "PROCALCITON", "LATICACIDVEN" in the last 168 hours.  Recent Results (from the past 240 hour(s))  MRSA Next Gen by PCR, Nasal     Status: None   Collection Time: 06/23/22  5:27 PM   Specimen: Nasal Mucosa; Nasal Swab  Result Value Ref Range Status   MRSA by PCR Next Gen NOT DETECTED NOT DETECTED Final    Comment: (NOTE) The GeneXpert MRSA Assay (FDA approved for NASAL specimens only), is one component of a comprehensive MRSA colonization surveillance program. It is not intended to diagnose MRSA infection nor to guide or monitor treatment for MRSA infections. Test performance is not FDA approved in patients less than 12 years old. Performed at [facility], [address] [place], [place] [address]   Culture, blood (Routine X 2) w Reflex to ID Panel      Status: None   Collection Time: 06/24/22  8:55 AM   Specimen: Right Antecubital  Result Value Ref Range Status   Specimen Description   Final    RIGHT ANTECUBITAL BOTTLES DRAWN AEROBIC AND ANAEROBIC   Special Requests   Final    Blood Culture results may not be optimal due to an excessive volume of blood received in culture bottles   Culture   Final    NO GROWTH 5 DAYS Performed at [facility], [address] [place], [place] [address]    Report Status 06/29/2022 FINAL  Final  Culture, blood (Routine X 2) w Reflex to ID Panel     Status: None   Collection Time: 06/24/22  8:55 AM   Specimen: BLOOD RIGHT HAND  Result Value Ref Range Status   Specimen Description   Final    BLOOD RIGHT HAND BOTTLES DRAWN AEROBIC AND ANAEROBIC   Special Requests   Final    Blood Culture  results may not be optimal due to an excessive volume of blood received in culture bottles   Culture   Final    NO GROWTH 5 DAYS Performed at [facility], [address] [place], [place] [address]    Report Status 06/29/2022 FINAL  Final    Radiology Studies: No results found.  Scheduled Meds:  aspirin EC  81 mg Oral Q breakfast   budesonide  3 mg Oral Daily   enalapril  10 mg Oral Daily   feeding supplement  237 mL Oral TID PC   finasteride  5 mg Oral Daily   heparin  5,000 Units Subcutaneous Q8H   insulin aspart  0-15 Units Subcutaneous TID WC   melatonin  5 mg Oral QHS   memantine  10 mg Oral BID   metoprolol tartrate  5 mg Intravenous Q8H   mirabegron ER  25 mg Oral Daily   multivitamin with minerals  1 tablet Oral Daily   pravastatin  80 mg Oral Daily   QUEtiapine  75 mg Oral QHS   tamsulosin  0.4 mg Oral QPC supper   Continuous Infusions:   LOS: 10 days    Time spent: 50 mins    Tiyanna Larcom, MD [facility]   If 7PM-7AM, please contact night-coverage

## 2022-07-03 NOTE — TOC Progression Note (Signed)
Transition of Care [facility]) - Initial/Assessment Note    Patient Details  Name: Jordan [name] MRN: [id number] Date of Birth: 23-Jul-1922  Transition of Care Jordan [name]) CM/SW Contact:    Jordan [name], LCSWA Phone Number: 07/03/2022, 11:42 AM  Clinical Narrative:                 LCSW contacted patient's daughter, Jordan [name], to inquire about facility choice. The daughter reports that she does not want the patient to go to the facility in [place].  The family member requested that the patient go to [facility].  LCSW explained that [facility] is an ALF and private pay.  LCSW presented the only other facility that has accepted the patient, [facility].  Jordan [name] reports that she will make a decision soon about [facility].  TOC following.    Expected Discharge Plan: [place] Barriers to Discharge: Continued Medical Work up   Patient Goals and CMS Choice Patient states their goals for this hospitalization and ongoing recovery are:: get better CMS Medicare.gov Compare Post Acute Care list provided to:: Patient Represenative (must comment) Choice offered to / list presented to : Patient, Adult Children  Expected Discharge Plan and Services Expected Discharge Plan: [place] In-house Referral: Clinical Social Work Discharge Planning Services: CM Consult Post Acute Care Choice: [place] arrangements for the past 2 months: Single Family Home                                      Prior Living Arrangements/Services Living arrangements for the past 2 months: Single Family Home Lives with:: Adult Children Patient language and need for interpreter reviewed:: Yes Do you feel safe going back to the place where you live?: Yes      Need for Family Participation in Patient Care: Yes (Comment) Care giver support system in place?: Yes (comment) Current home services: DME Criminal Activity/Legal Involvement Pertinent to Current  Situation/Hospitalization: No - Comment as needed  Activities of Daily Living Home Assistive Devices/Equipment: Environmental consultant (specify type), Eyeglasses, Hearing aid, Other (Comment) (lift chari) ADL Screening (condition at time of admission) Patient's cognitive ability adequate to safely complete daily activities?: No Is the patient deaf or have difficulty hearing?: Yes Does the patient have difficulty seeing, even when wearing glasses/contacts?: Yes (legally blind per daughter) Does the patient have difficulty concentrating, remembering, or making decisions?: Yes Patient able to express need for assistance with ADLs?: No Does the patient have difficulty dressing or bathing?: Yes Independently performs ADLs?: No Communication: Independent Dressing (OT): Dependent Is this a change from baseline?: Change from baseline, expected to last >3 days Grooming: Dependent Is this a change from baseline?: Change from baseline, expected to last >3 days Feeding: Dependent Is this a change from baseline?: Change from baseline, expected to last >3 days Bathing: Dependent Is this a change from baseline?: Change from baseline, expected to last >3 days Toileting: Dependent Is this a change from baseline?: Change from baseline, expected to last >3days In/Out Bed: Dependent Is this a change from baseline?: Change from baseline, expected to last >3 days Walks in Home: Independent with device (comment) Does the patient have difficulty walking or climbing stairs?: Yes Weakness of Legs: Both Weakness of Arms/Hands: Both  Permission Sought/Granted                  Emotional Assessment Appearance:: Appears stated age Attitude/Demeanor/Rapport:  Engaged Affect (typically observed): Accepting   Alcohol / Substance Use: Not Applicable Psych Involvement: No (comment)  Admission diagnosis:  Altered mental status, unspecified altered mental status type [V40.08] Acute metabolic encephalopathy [Q76.19] Acute  encephalopathy [G93.40] Patient Active Problem List   Diagnosis Date Noted   Acute encephalopathy 50/93/2671   Acute metabolic encephalopathy 24/58/0998   Confusion with nonfocal neurological examination 06/20/2022   Dementia with behavioral disturbance ([place]) 06/20/2022   Lymphocytic colitis 10/13/2019   Benign prostatic hyperplasia with urinary obstruction 08/20/2019   Nocturia 08/20/2019   Pancreatitis 06/13/2016   Type 2 diabetes mellitus with complication (HCC)    S/P drug eluting coronary stent placement    Hypertension    Hyperlipidemia    History of colonic polyps    Diverticulosis of colon without hemorrhage    Hemorrhoids 10/23/2014   Constipation 06/03/2012   Balance disorder/Fall at home 05/29/2012   PCP:  Jordan Noble, MD Pharmacy:   Jordan [name], [address] [place] [address] Phone: [phone] Fax: [phone]     Social Determinants of Health (SDOH) Interventions    Readmission Risk Interventions     No data to display

## 2022-07-03 NOTE — Progress Notes (Signed)
Physical Therapy Treatment Patient Details Name: Jordan [name] MRN: [id number] DOB: 1922/08/20 Today's Date: 07/03/2022   History of Present Illness 88 yo male presents to [facility] on [facility]5 with AMS, neuro and UA clear so d/c. Pt back to ED on 12/7 with continued AMS with combativeness. PMH includes HTN, DMII, dementia, HLD,  aortic stenosis, BPH, CAD, macular degeneration and is legally blind.    PT Comments    Pt pleasant, asking the day of the week several times during session. Pt following commands with increased time and demonstrating increased gait tolerance. Pt OOB to chair for breakfast with RN aware of need for assist with feeding. Will continue to follow with D/C plan appropriate.     Recommendations for follow up therapy are one component of a multi-disciplinary discharge planning process, led by the attending physician.  Recommendations may be updated based on patient status, additional functional criteria and insurance authorization.  Follow Up Recommendations  Skilled nursing-short term rehab (<3 hours/day) Can patient physically be transported by private vehicle: Yes   Assistance Recommended at Discharge Frequent or constant Supervision/Assistance  Patient can return home with the following A lot of help with bathing/dressing/bathroom;Assist for transportation;Assistance with feeding;Direct supervision/assist for financial management;Direct supervision/assist for medications management;Assistance with cooking/housework;A lot of help with walking and/or transfers   Equipment Recommendations  None recommended by PT    Recommendations for Other Services       Precautions / Restrictions Precautions Precautions: Fall Precaution Comments: incontinent, legally blind     Mobility  Bed Mobility Overal bed mobility: Needs Assistance Bed Mobility: Supine to Sit     Supine to sit: Min assist     General bed mobility comments: min assist with cues to initiate pivot to  left and assist to fully elevate trunk from surface.    Transfers Overall transfer level: Needs assistance   Transfers: Sit to/from Stand Sit to Stand: Min assist           General transfer comment: cues for hand placement and safety with assist to stabilize and rise    Ambulation/Gait Ambulation/Gait assistance: Mod assist Gait Distance (Feet): 130 Feet Assistive device: Rolling walker (2 wheels) Gait Pattern/deviations: Step-through pattern, Decreased stride length, Narrow base of support   Gait velocity interpretation: <1.31 ft/sec, indicative of household ambulator   General Gait Details: very narrow BOS with mod cues for increased BOS, position in RW , posture and direction. Pt able to walk 130' with standing rest grossly half way and physical assist to control and direct RW.   Stairs             Wheelchair Mobility    Modified Rankin (Stroke Patients Only)       Balance Overall balance assessment: Needs assistance   Sitting balance-Leahy Scale: Fair Sitting balance - Comments: static sitting with supervision   Standing balance support: Bilateral upper extremity supported Standing balance-Leahy Scale: Poor Standing balance comment: bil UE support on RW with physical assist for balance                            Cognition Arousal/Alertness: Awake/alert Behavior During Therapy: WFL for tasks assessed/performed Overall Cognitive Status: No family/caregiver present to determine baseline cognitive functioning  Exercises General Exercises - Lower Extremity Long Arc Quad: AROM, Both, 15 reps, Seated Hip Flexion/Marching: AROM, Both, 15 reps, Seated    General Comments        Pertinent Vitals/Pain Pain Assessment Pain Assessment: No/denies pain    Home Living                          Prior Function            PT Goals (current goals can now be found in the care  plan section) Progress towards PT goals: Progressing toward goals    Frequency    Min 2X/week      PT Plan Current plan remains appropriate    Co-evaluation              AM-PAC PT "6 Clicks" Mobility   Outcome Measure  Help needed turning from your back to your side while in a flat bed without using bedrails?: A Little Help needed moving from lying on your back to sitting on the side of a flat bed without using bedrails?: A Little Help needed moving to and from a bed to a chair (including a wheelchair)?: A Lot Help needed standing up from a chair using your arms (e.g., wheelchair or bedside chair)?: A Lot Help needed to walk in hospital room?: A Lot Help needed climbing 3-5 steps with a railing? : Total 6 Click Score: 13    End of Session Equipment Utilized During Treatment: Gait belt Activity Tolerance: Patient tolerated treatment well Patient left: in chair;with call bell/phone within reach;with chair alarm set Nurse Communication: Mobility status PT Visit Diagnosis: Other abnormalities of gait and mobility (R26.89);Muscle weakness (generalized) (M62.81);Difficulty in walking, not elsewhere classified (R26.2)     Time: 1856-3149 PT Time Calculation (min) (ACUTE ONLY): 25 min  Charges:  $Gait Training: 8-22 mins $Therapeutic Activity: 8-22 mins                     Bayard Males, PT Acute Rehabilitation Services Office: [place] 07/03/2022, 9:15 AM

## 2022-07-04 DIAGNOSIS — R918 Other nonspecific abnormal finding of lung field: Secondary | ICD-10-CM | POA: Diagnosis not present

## 2022-07-04 DIAGNOSIS — G9341 Metabolic encephalopathy: Secondary | ICD-10-CM | POA: Diagnosis not present

## 2022-07-04 DIAGNOSIS — R531 Weakness: Secondary | ICD-10-CM | POA: Diagnosis not present

## 2022-07-04 LAB — GLUCOSE, CAPILLARY
Glucose-Capillary: 165 mg/dL — ABNORMAL HIGH (ref 70–99)
Glucose-Capillary: 117 mg/dL — ABNORMAL HIGH (ref 70–99)
Glucose-Capillary: 222 mg/dL — ABNORMAL HIGH (ref 70–99)
Glucose-Capillary: 70 mg/dL (ref 70–99)
Glucose-Capillary: 211 mg/dL — ABNORMAL HIGH (ref 70–99)

## 2022-07-04 NOTE — Progress Notes (Addendum)
2:18pm: CSW spoke with [name] who states she wants to tour [facility] before proceeding with accepting the bed offer. CSW emphasized the urgency of this matter due to the patient being medically stable for discharge to SNF. [name] to tour facility and return call to CSW once complete.  CSW informed Janie at [employer] of information.  11:50am: CSW attempted to reach patient's sister [name] without success - no voicemail option available. CSW sent text requesting a return call.  Madilyn Fireman, MSW, LCSW Transitions of Care  Clinical Social Worker II [phone]

## 2022-07-04 NOTE — Progress Notes (Addendum)
PROGRESS NOTE    Jordan [name]  XAJ:[id number] DOB: [birth year] DOA: 06/21/2022 PCP: Asencion Noble, MD   Brief Narrative:  This 88 yrs old male with PMH significant for Aortic stenosis (not TAVR candidate) dementia, HTN, BPH, DM-2, HLD, lymphocytic colitis-who presented to the ED with altered mental status.  Patient was transferred to [facility] for LTM EEG / LP/ Neurology evaluation. As per family, there are no plans to pursue LP.  Patient is awaiting SNF placement.  Patient is needing one-to-one observation.  Significant events: 12/05>> [facility] ED visit for weakness/falls/confusion.  Discharged home. 12/07>> Admit to [facility] for worsening confusion 12/10>> transferred to [facility] for neurology/LTM EEG/possible LP-given ongoing confusion. 12/14>> goals of care discussion with daughter by  MD/palliative care team-no aggressive care including LP.   12/ 15:   Daughter requests SNF on discharge.   Significant studies: 12/05>> CT head: No acute intracranial abnormality 12/05>> CTA head/neck: No LVO, 75% stenosis of right ICA at the origin. 12/05>> MRI brain: No acute/subacute CVA 12/05>> EEG: No seizures.   12/07>> CT head: No acute intracranial abnormality. 12/07>> TSH: 1.35 12/08>> EEG: No seizures. 12/08>> vitamin B12: 398 (normal) 12/09>> ammonia: 13 (normal) 12/09>> RPR: Nonreactive 12/10>> LTM EEG: No seizures 12/10>> CT chest/abdomen/pelvis: Concerning for indolent lung adenocarcinoma.   Significant microbiology data: 12/07>> blood culture:Staph warneri (likely contamination) 12/09>> blood culture: No growth  Assessment & Plan:   Principal Problem:   Acute metabolic encephalopathy Active Problems:   Dementia with behavioral disturbance (HCC)   Type 2 diabetes mellitus with complication (HCC)   Hypertension   Hyperlipidemia   Acute encephalopathy  Acute metabolic encephalopathy: Overall he has much improved. Tolerating Seroquel / Melatonin. Etiology unclear but suspect this is  delirium related to dementia. No significant electrolyte abnormalities / Infection/ Medications identified as provoking factors. No seizures seen on LTM EEG Only other differential at this point would be paraneoplastic encephalitis syndrome given lung mass. After extensive goals of care discussion with daughter- No LP- does not desire further workup. Continue delirium precautions. TOC notified for SNF placement.  Staph Werneri  bacteremia Likely contamination. Repeat blood cultures negative. Antibiotics discontinued.   History of lymphocytic colitis Continue Entocort   Hyperlipidemia: Continue statin.   Essential HTN: Enalapril, metoprolol. Blood pressure stable.   Moderate to severe aortic stenosis: Recently evaluated by structural cardiology Not a candidate for TAVR   DM II: Hb A1c 7.2, well controlled. Continue  RISS.    Left upper lobe lung mass: CT chest on 12/10-suspicious for indolent malignancy-likely adenocarcinoma After extensive goals of care discussion > No further workup planned.   BPH: Continue Flomax/finasteride Frequent bladder scans to ensure no retention.   Dementia: He was tried on Aricept several years ago but had side effects-has been maintained on Namenda for the past several years Per daughter on 12/11-this apparently was done for "short-term memory issues"-but suspect he has significant amount of dementia at baseline.  Tried to explain to patient's daughter that at times when patients are at the familiar surroundings - it could mask the severity of her underlying dementia. Continue Nemenda.   Failure to Thrive syndrome: Underlying dementia-delirium Continue to work with PT/OT/SLP Now appetite has improved and is tolerating a fair amount of his meals.   Infrarenal abdominal aortic aneurysm: Continue supportive care.  Doubt any benefit from surveillance in the outpatient at this point given advanced age.   Nutrition Status: Nutrition  Problem: Inadequate oral intake Etiology: decreased appetite Signs/Symptoms: meal completion < 25% Interventions: Refer to  RD note for recommendations   Underweight: Estimated body mass index is 18.04 kg/m as calculated from the following:   Height as of this encounter: 5\' 9"  (1.753 m).   Weight as of this encounter: 55.4 kg.     DVT prophylaxis: Heparin sq Code Status: DNR Family Communication: No family at bed side. Disposition Plan:    Status is: Inpatient Remains inpatient appropriate because: Severe encephalopathy.   TOC working on finding SNF. Patient is medically clear.  Consultants:  None  Procedures:None  Antimicrobials:   Anti-infectives (From admission, onward)    Start     Dose/Rate Route Frequency Ordered Stop   06/24/22 1345  vancomycin (VANCOREADY) IVPB 1250 mg/250 mL  Status:  Discontinued       See Hyperspace for full Linked Orders Report.   1,250 mg 166.7 mL/hr over 90 Minutes Intravenous Every 24 hours 06/23/22 1248 06/23/22 1250   06/24/22 1345  vancomycin (VANCOREADY) IVPB 1250 mg/250 mL  Status:  Discontinued       See Hyperspace for full Linked Orders Report.   1,250 mg 166.7 mL/hr over 90 Minutes Intravenous Every 24 hours 06/23/22 1250 06/23/22 1251   06/24/22 1345  vancomycin (VANCOREADY) IVPB 750 mg/150 mL  Status:  Discontinued       See Hyperspace for full Linked Orders Report.   750 mg 150 mL/hr over 60 Minutes Intravenous Every 24 hours 06/23/22 1251 06/23/22 1653   06/24/22 1200  cefTRIAXone (ROCEPHIN) 2 g in sodium chloride 0.9 % 100 mL IVPB  Status:  Discontinued        2 g 200 mL/hr over 30 Minutes Intravenous Every 24 hours 06/23/22 1653 06/25/22 0929   06/23/22 1345  vancomycin (VANCOREADY) IVPB 1250 mg/250 mL  Status:  Discontinued       See Hyperspace for full Linked Orders Report.   1,250 mg 166.7 mL/hr over 90 Minutes Intravenous  Once 06/23/22 1248 06/23/22 1250   06/23/22 1345  vancomycin (VANCOREADY) IVPB 750 mg/150 mL   Status:  Discontinued       See Hyperspace for full Linked Orders Report.   750 mg 150 mL/hr over 60 Minutes Intravenous  Once 06/23/22 1250 06/23/22 1251   06/23/22 1345  vancomycin (VANCOREADY) IVPB 1250 mg/250 mL       See Hyperspace for full Linked Orders Report.   1,250 mg 166.7 mL/hr over 90 Minutes Intravenous  Once 06/23/22 1251 06/23/22 1630       Subjective: Patient was seen and examined at bedside.  Overnight events noted. Patient was sitting comfortably on the chair. He states that he is doing better,  denies any pain.   Patient has participated in physical therapy,  awaiting SNF placement.   Objective: Vitals:   07/04/22 0127 07/04/22 0508 07/04/22 0700 07/04/22 1229  BP:  (!) 172/67 (!) 159/65 123/63  Pulse:  69 71 76  Resp:  16 16 16   Temp:  (!) 97.5 F (36.4 C) (!) 96 F (35.6 C) 98.4 F (36.9 C)  TempSrc:  Oral Oral Oral  SpO2:  100%  98%  Weight: 54.9 kg     Height:        Intake/Output Summary (Last 24 hours) at 07/04/2022 1232 Last data filed at 07/04/2022 0900 Gross per 24 hour  Intake 2036 ml  Output 720 ml  Net 1316 ml   Filed Weights   07/01/22 0500 07/02/22 0407 07/04/22 0127  Weight: 56.5 kg 55.4 kg 54.9 kg    Examination:  General exam:  Appears comfortable, chronically ill looking, deconditioned. Respiratory system: CTA bilaterally, no accessory muscle use, respiratory effort normal, RR 15. Cardiovascular system: S1 & S2 heard, regular rate and rhythm, murmur+ Gastrointestinal system: Abdomen is soft, non tender, non distended, BS+ Central nervous system: Alert and oriented x 2, no focal neurological deficits. Extremities: No edema, no cyanosis, no clubbing Skin: No rashes, lesions or ulcers Psychiatry:  Mood & affect appropriate.     Data Reviewed: I have personally reviewed following labs and imaging studies  CBC: No results for input(s): "WBC", "NEUTROABS", "HGB", "HCT", "MCV", "PLT" in the last 168 hours. Basic Metabolic  Panel: Recent Labs  Lab 06/28/22 0304  NA 136  K 3.8  CL 104  CO2 24  GLUCOSE 139*  BUN 30*  CREATININE 1.05  CALCIUM 8.1*   GFR: Estimated Creatinine Clearance: 31.2 mL/min (by C-G formula based on SCr of 1.05 mg/dL). Liver Function Tests: No results for input(s): "AST", "ALT", "ALKPHOS", "BILITOT", "PROT", "ALBUMIN" in the last 168 hours. No results for input(s): "LIPASE", "AMYLASE" in the last 168 hours. No results for input(s): "AMMONIA" in the last 168 hours. Coagulation Profile: No results for input(s): "INR", "PROTIME" in the last 168 hours. Cardiac Enzymes: No results for input(s): "CKTOTAL", "CKMB", "CKMBINDEX", "TROPONINI" in the last 168 hours. BNP (last 3 results) No results for input(s): "PROBNP" in the last 8760 hours. HbA1C: No results for input(s): "HGBA1C" in the last 72 hours. CBG: Recent Labs  Lab 07/03/22 1137 07/03/22 1658 07/03/22 2149 07/04/22 0604 07/04/22 1157  GLUCAP 172* 159* 165* 117* 222*   Lipid Profile: No results for input(s): "CHOL", "HDL", "LDLCALC", "TRIG", "CHOLHDL", "LDLDIRECT" in the last 72 hours. Thyroid Function Tests: No results for input(s): "TSH", "T4TOTAL", "FREET4", "T3FREE", "THYROIDAB" in the last 72 hours. Anemia Panel: No results for input(s): "VITAMINB12", "FOLATE", "FERRITIN", "TIBC", "IRON", "RETICCTPCT" in the last 72 hours. Sepsis Labs: No results for input(s): "PROCALCITON", "LATICACIDVEN" in the last 168 hours.  No results found for this or any previous visit (from the past 240 hour(s)).   Radiology Studies: No results found.  Scheduled Meds:  aspirin EC  81 mg Oral Q breakfast   budesonide  3 mg Oral Daily   enalapril  10 mg Oral Daily   feeding supplement  237 mL Oral TID PC   finasteride  5 mg Oral Daily   heparin  5,000 Units Subcutaneous Q8H   insulin aspart  0-15 Units Subcutaneous TID WC   melatonin  5 mg Oral QHS   memantine  10 mg Oral BID   metoprolol tartrate  12.5 mg Oral BID    mirabegron ER  25 mg Oral Daily   multivitamin with minerals  1 tablet Oral Daily   pravastatin  80 mg Oral Daily   QUEtiapine  75 mg Oral QHS   tamsulosin  0.4 mg Oral QPC supper   Continuous Infusions:   LOS: 11 days    Time spent: 35 mins    Shanielle Correll, MD [facility]   If 7PM-7AM, please contact night-coverage

## 2022-07-04 NOTE — Progress Notes (Signed)
Occupational Therapy Treatment Patient Details Name: Dixon Luczak Faul MRN: [id number] DOB: 1923/02/06 Today's Date: 07/04/2022   History of present illness 88 yo male presents to [facility] on 12/5 with AMS, neuro and UA clear so d/c. Pt back to ED on 12/7 with continued AMS with combativeness. PMH includes HTN, DMII, dementia, HLD,  aortic stenosis, BPH, CAD, macular degeneration and is legally blind.   OT comments  Pt in bed upon therapy arrival and agreeable to participate in OT treatment. Session focused on bed mobility, sit to stand transitions and functional mobility with safety/awareness pertaining to RW management and use in order to increase functional performance during toilet transfers and standing self care tasks. Pt presents with posterior lean when transitioning to standing from EOB requiring min a to maintain balance. Does demonstrate improvement with sit to stand technique with second trail from arm less chair. Pt requested to sit up in recliner. Safety belt on and communicated with nursing.    Recommendations for follow up therapy are one component of a multi-disciplinary discharge planning process, led by the attending physician.  Recommendations may be updated based on patient status, additional functional criteria and insurance authorization.    Follow Up Recommendations  Skilled nursing-short term rehab (<3 hours/day)     Assistance Recommended at Discharge Frequent or constant Supervision/Assistance  Patient can return home with the following  Assistance with cooking/housework;Assistance with feeding;Direct supervision/assist for medications management;Direct supervision/assist for financial management;Assist for transportation;Help with stairs or ramp for entrance;A lot of help with bathing/dressing/bathroom;A little help with walking and/or transfers   Equipment Recommendations  Other (comment) (defer to next venue of care)       Precautions / Restrictions  Precautions Precautions: Fall Precaution Comments: incontinent, legally blind Restrictions Weight Bearing Restrictions: No       Mobility Bed Mobility Overal bed mobility: Needs Assistance Bed Mobility: Sidelying to Sit   Sidelying to sit: Min assist       General bed mobility comments: Set-up of right hand on bed railing as well as VC for techinque to roll to side prior to sitting up on EOB. Very minimal physical assist needed to bring trunk up off the bed. Patient Response: Cooperative  Transfers Overall transfer level: Needs assistance Equipment used: Rolling walker (2 wheels) Transfers: Sit to/from Stand, Bed to chair/wheelchair/BSC Sit to Stand: Min assist, From elevated surface Stand pivot transfers: Min assist         General transfer comment: cues for hand placement and safety with RW management     Balance Overall balance assessment: Needs assistance Sitting-balance support: Feet supported Sitting balance-Leahy Scale: Fair Sitting balance - Comments: static sitting EOB   Standing balance support: Bilateral upper extremity supported, During functional activity Standing balance-Leahy Scale: Poor Standing balance comment: Min A for balance while standing. Initially with posterior lean, rocking back on heels.           ADL either performed or assessed with clinical judgement      Cognition Arousal/Alertness: Awake/alert Behavior During Therapy: WFL for tasks assessed/performed Overall Cognitive Status: No family/caregiver present to determine baseline cognitive functioning         General Comments: followed directions well with increased time and/or repeated directions.                   Pertinent Vitals/ Pain       Pain Assessment Pain Assessment: Faces Faces Pain Scale: No hurt         Frequency  Min 2X/week  Progress Toward Goals  OT Goals(current goals can now be found in the care plan section)  Progress towards OT  goals: Progressing toward goals     Plan Discharge plan remains appropriate;Frequency remains appropriate       AM-PAC OT "6 Clicks" Daily Activity     Outcome Measure   Help from another person eating meals?: A Lot Help from another person taking care of personal grooming?: A Lot Help from another person toileting, which includes using toliet, bedpan, or urinal?: A Lot Help from another person bathing (including washing, rinsing, drying)?: A Lot Help from another person to put on and taking off regular upper body clothing?: A Lot Help from another person to put on and taking off regular lower body clothing?: Total 6 Click Score: 11    End of Session Equipment Utilized During Treatment: Gait belt;Rolling walker (2 wheels)  OT Visit Diagnosis: Unsteadiness on feet (R26.81);Muscle weakness (generalized) (M62.81);Adult, failure to thrive (R62.7);Other symptoms and signs involving cognitive function;History of falling (Z91.81);Other abnormalities of gait and mobility (R26.89)   Activity Tolerance Patient tolerated treatment well   Patient Left in chair;with call bell/phone within reach;with chair alarm set;with nursing/sitter in room   Nurse Communication Mobility status        Time: 4580-9983 OT Time Calculation (min): 18 min  Charges: OT General Charges $OT Visit: 1 Visit OT Treatments $Therapeutic Activity: 8-22 mins  Ailene Ravel, OTR/L,CBIS  Supplemental OT - [facility] and [facility]   Mylin Hirano, Clarene Duke 07/04/2022, 4:00 PM

## 2022-07-04 NOTE — Progress Notes (Signed)
Metoprolol held this shift per Crosley MD. HR in the 40s and 50s. Patient remains asymptomatic. 1:1 sitter remains at bedside.

## 2022-07-04 NOTE — Progress Notes (Signed)
Patient ID: [name], male   DOB: 27-Mar-1923, 88 y.o.   MRN: [id number]    Progress Note from the Palliative Medicine Team at [facility]   Patient Name: Jordan [name]        Date: 07/04/2022 DOB: 1923/01/06  Age: 88 y.o. MRN#: [id number] Attending Physician: Shawna Clamp, MD Primary Care Physician: Asencion Noble, MD Admit Date: 06/21/2022   Medical records reviewed, assessed patient at bedside discussed with Daughter/[name]  by telephone     88 y.o.  male with history of aortic stenosis (not TAVR candidate) dementia( family report mild), HTN, BPH, DM-2, HLD, lymphocytic colitis-who presented to altered mental status.  Patient was transferred to [facility] for LTM EEG/LP/neurology evaluation.     Significant events: 12/05>> [facility] ED visit for weakness/falls/confusion.  Discharged home. 12/07>> admit to [facility] for worsening confusion 12/10>> transferred to [facility] for neurology/LTM EEG/possible LP-given ongoing confusion.   Significant studies: 12/05>> CT head: No acute intracranial abnormality 12/05>> CTA head/neck: No LVO, 75% stenosis of right ICA at the origin. 12/05>> MRI brain: No acute/subacute CVA show 12/05>> EEG: No seizures.   12/07>> CT head: No acute intracranial abnormality. 12/07>> TSH: 1.35 12/08>> EEG: No seizures. 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  12/19- stable for discharge, family having difficult time making decision for SNF- she wants to ensure quality of facility before transfer   Significant microbiology data: 12/07>> blood culture:Staph warneri (likely contamination) 12/09>> blood culture: No growth   This NP assessed patient at the bedside as a follow up for palliative medicine needs and emotional support.   Patient is out of bed to the chair , eating 50% of his meals, arouses easily to verbal  stimulation.   I spoke  with daughter/ Jordan [name] by telephone.  Continued exploration and consideration of next steps in the plan of care.   Education offered on the natural trajectory and expectations of the diagnosis of dementia.    Education offered on the difference between a full medical support/life prolonging path vs a palliative comfort path.  Education offered on hospice benefit; philosophy and eligibility.   Plan of Care:      -DNR/DNI -no artificial feeding now or in the future -Transition to SNF for short-term rehab, with intention of ultimately returning home with daughter        Education offered on the likely trajectory of continued physical, functional and cognitive decline and the importance of preparation and consideration for anticipatory care needs in the home.   Education on self-referral for in-home hospice  Education offered today regarding  the importance of continued conversation with the medical providers regarding overall plan of care and treatment options,  ensuring decisions are within the context of the patients values and GOCs.  Family encouraged to call with questions or concerns   Questions and concerns addressed     35 minutes  Wadie Lessen NP  Palliative Medicine Team Team Phone # [phone] Pager [phone]

## 2022-07-04 NOTE — Progress Notes (Signed)
Mobility Specialist Progress Note    07/04/22 1135  Mobility  Activity Ambulated with assistance in hallway  Level of Assistance Minimal assist, patient does 75% or more  Assistive Device Front wheel walker  Distance Ambulated (ft) 115 ft  Activity Response Tolerated well  Mobility Referral Yes  $Mobility charge 1 Mobility   Pt received in chair and agreeable. No complaints on walk. Required assist to guide RW. Returned to chair with call bell in reach and chair alarm on.    Hildred Alamin Mobility Specialist  Please Psychologist, sport and exercise or Rehab Office at [phone]

## 2022-07-04 NOTE — Progress Notes (Signed)
Initial Nutrition Assessment  DOCUMENTATION CODES:   Not applicable  INTERVENTION:  -Continue Ensure Enlive po TID, each supplement provides 350 kcal and 20 grams of protein.    NUTRITION DIAGNOSIS:   Inadequate oral intake related to decreased appetite as evidenced by meal completion < 25%.  GOAL:   Patient will meet greater than or equal to 90% of their needs  MONITOR:   PO intake, Labs, Weight trends  REASON FOR ASSESSMENT:   Consult Enteral/tube feeding initiation and management  ASSESSMENT:   88 y.o. male presented to the ED with behavioral disturbance, noted combative and agitation x 3 days. PMH includes CAD, T2DM, HTN, HLD, dementia, and diverticulosis. Pt admitted with acute metabolic encephalopathy.  Meds reviewed: sliding scale insulin, MVI. Labs reviewed: WNL.   Pt ate 100% of his breakfast this am. Per record, pt has eaten 50-100% of his meals.   Pt is awaiting SNF placement.   Diet Order:   Diet Order             DIET DYS 3 Room service appropriate? No; Fluid consistency: Thin  Diet effective now                   EDUCATION NEEDS:   Not appropriate for education at this time  Skin:  Skin Assessment: Reviewed RN Assessment  Last BM:  07/03/22  Height:   Ht Readings from Last 1 Encounters:  06/22/22 5\' 9"  (1.753 m)    Weight:   Wt Readings from Last 1 Encounters:  07/04/22 54.9 kg    Ideal Body Weight:  72.7 kg  BMI:  Body mass index is 17.87 kg/m.  Estimated Nutritional Needs:   Kcal:  1700-1900  Protein:  85-100 grams  Fluid:  >/= 1.7 L  Thalia Bloodgood, RD, LDN, CNSC.

## 2022-07-05 DIAGNOSIS — G9341 Metabolic encephalopathy: Secondary | ICD-10-CM | POA: Diagnosis not present

## 2022-07-05 LAB — GLUCOSE, CAPILLARY
Glucose-Capillary: 122 mg/dL — ABNORMAL HIGH (ref 70–99)
Glucose-Capillary: 87 mg/dL (ref 70–99)
Glucose-Capillary: 154 mg/dL — ABNORMAL HIGH (ref 70–99)
Glucose-Capillary: 160 mg/dL — ABNORMAL HIGH (ref 70–99)

## 2022-07-05 LAB — BASIC METABOLIC PANEL
Anion gap: 10 (ref 5–15)
BUN: 36 mg/dL — ABNORMAL HIGH (ref 8–23)
CO2: 25 mmol/L (ref 22–32)
Calcium: 8.6 mg/dL — ABNORMAL LOW (ref 8.9–10.3)
Chloride: 103 mmol/L (ref 98–111)
Creatinine, Ser: 1.25 mg/dL — ABNORMAL HIGH (ref 0.61–1.24)
GFR, Estimated: 52 mL/min — ABNORMAL LOW (ref >60)
Glucose, Bld: 120 mg/dL — ABNORMAL HIGH (ref 70–99)
Potassium: 4.3 mmol/L (ref 3.5–5.1)
Sodium: 138 mmol/L (ref 135–145)

## 2022-07-05 LAB — CBC
HCT: 32.5 % — ABNORMAL LOW (ref 39.0–52.0)
Hemoglobin: 11 g/dL — ABNORMAL LOW (ref 13.0–17.0)
MCH: 29.9 pg (ref 26.0–34.0)
MCHC: 33.8 g/dL (ref 30.0–36.0)
MCV: 88.3 fL (ref 80.0–100.0)
Platelets: 318 10*3/uL (ref 150–400)
RBC: 3.68 MIL/uL — ABNORMAL LOW (ref 4.22–5.81)
RDW: 13.2 % (ref 11.5–15.5)
WBC: 4.7 10*3/uL (ref 4.0–10.5)
nRBC: 0 % (ref 0.0–0.2)

## 2022-07-05 LAB — MAGNESIUM: Magnesium: 2.2 mg/dL (ref 1.7–2.4)

## 2022-07-05 LAB — PHOSPHORUS: Phosphorus: 4.1 mg/dL (ref 2.5–4.6)

## 2022-07-05 NOTE — Progress Notes (Signed)
Mobility Specialist Progress Note:   07/05/22 1101  Mobility  Activity Ambulated with assistance in hallway  Level of Assistance Moderate assist, patient does 50-74%  Assistive Device Front wheel walker  Distance Ambulated (ft) 100 ft  Activity Response Tolerated fair  Mobility Referral Yes  $Mobility charge 1 Mobility   Pt received in bed and fatigued but eager to ambulate. Required constant steering of RW and did not follow any verbal cueing. Unsteady with ambulation. Pt returned to bed with all needs met, mittens on, and bed alarm on.   Andrey Campanile Mobility Specialist Please contact via SecureChat or  Rehab office at [phone]

## 2022-07-05 NOTE — Progress Notes (Signed)
CSW spoke with patient's daughter [name] who states she will accept the bed offer from [facility].  CSW notified Janie in admissions at [facility] of acceptance. There are no beds available today but insurance authorization has to be obtained.  CSW will submit for insurance authorization.  Madilyn Fireman, MSW, LCSW Transitions of Care  Clinical Social Worker II [phone]

## 2022-07-05 NOTE — Progress Notes (Signed)
PROGRESS NOTE    Kamsiyochukwu Spickler Franken  URK:[id number] DOB: May 04, 1923 DOA: 06/21/2022 PCP: Asencion Noble, MD   Brief Narrative:  This 88 yrs old male with PMH significant for Aortic stenosis (not TAVR candidate) dementia, HTN, BPH, DM-2, HLD, lymphocytic colitis-who presented to the ED with altered mental status.  Patient was transferred to [facility] for LTM EEG / LP/ Neurology evaluation. As per family, there are no plans to pursue LP.  Patient is awaiting SNF placement and is medically cleared for discharge.  Significant events: 12/05>> AP ED visit for weakness/falls/confusion.  Discharged home. 12/07>> Admit to [facility] for worsening confusion 12/10>> transferred to [facility] for neurology/LTM EEG/possible LP-given ongoing confusion. 12/14>> goals of care discussion with daughter by  MD/palliative care team-no aggressive care including LP.   12/ 15:   Daughter requests SNF on discharge. 12/20: Insurance authorized, no bed available at SNF today.   Significant studies: 12/05>> CT head: No acute intracranial abnormality 12/05>> CTA head/neck: No LVO, 75% stenosis of right ICA at the origin. 12/05>> MRI brain: No acute/subacute CVA 12/05>> EEG: No seizures.   12/07>> CT head: No acute intracranial abnormality. 12/07>> TSH: 1.35 12/08>> EEG: No seizures. 12/08>> vitamin B12: 398 (normal) 12/09>> ammonia: 13 (normal) 12/09>> RPR: Nonreactive 12/10>> LTM EEG: No seizures 12/10>> CT chest/abdomen/pelvis: Concerning for indolent lung adenocarcinoma.   Significant microbiology data: 12/07>> blood culture:Staph warneri (likely contamination) 12/09>> blood culture: No growth  Assessment & Plan:   Principal Problem:   Acute metabolic encephalopathy Active Problems:   Dementia with behavioral disturbance (HCC)   Type 2 diabetes mellitus with complication (HCC)   Hypertension   Hyperlipidemia   Acute encephalopathy  Acute metabolic encephalopathy: Overall he has much improved. Tolerating Seroquel /  Melatonin. Etiology unclear but suspect this is delirium related to dementia. No significant electrolyte abnormalities / Infection/ Medications identified as provoking factors. No seizures seen on LTM EEG Only other differential at this point would be paraneoplastic encephalitis syndrome given lung mass. After extensive goals of care discussion with daughter- No LP- does not desire further workup. Continue delirium precautions. TOC notified for SNF placement, pending bed availability.  Staph Werneri bacteremia Likely contamination. Repeat blood cultures negative. Antibiotics discontinued.   History of lymphocytic colitis Continue Entocort   Hyperlipidemia: Continue statin.   Essential HTN: Enalapril, metoprolol. Blood pressure stable.   Moderate to severe aortic stenosis: Recently evaluated by structural cardiology Not a candidate for TAVR   DM II: Hb A1c 7.2, well controlled. Continue  RISS.    Left upper lobe lung mass: CT chest on 12/10-suspicious for indolent malignancy-likely adenocarcinoma After extensive goals of care discussion > No further workup planned.   BPH: Continue Flomax/finasteride Frequent bladder scans to ensure no retention.   Dementia: He was tried on Aricept several years ago but had side effects-has been maintained on Namenda for the past several years Per daughter on 12/11-this apparently was done for "short-term memory issues"-but suspect he has significant amount of dementia at baseline.  Tried to explain to patient's daughter that at times when patients are at the familiar surroundings - it could mask the severity of her underlying dementia. Continue Nemenda.   Failure to Thrive syndrome: Underlying dementia-delirium Continue to work with PT/OT/SLP Now appetite has improved and is tolerating a fair amount of his meals.   Infrarenal abdominal aortic aneurysm: Continue supportive care.  Doubt any benefit from surveillance in the outpatient  at this point given advanced age.   Nutrition Status: Nutrition Problem: Inadequate oral intake  Etiology: decreased appetite Signs/Symptoms: meal completion < 25% Interventions: Refer to RD note for recommendations   Underweight: Estimated body mass index is 18.04 kg/m as calculated from the following:   Height as of this encounter: 5\' 9"  (1.753 m).   Weight as of this encounter: 55.4 kg.   DVT prophylaxis: Heparin sq Code Status: DNR Family Communication: No family at bed side this AM. Disposition Plan:   Status is: Inpatient Remains inpatient appropriate because: Severe encephalopathy.   TOC working on finding SNF. Patient is medically clear.  Consultants:  None  Procedures: None  Antimicrobials:   Anti-infectives (From admission, onward)    Start     Dose/Rate Route Frequency Ordered Stop   06/24/22 1345  vancomycin (VANCOREADY) IVPB 1250 mg/250 mL  Status:  Discontinued       See Hyperspace for full Linked Orders Report.   1,250 mg 166.7 mL/hr over 90 Minutes Intravenous Every 24 hours 06/23/22 1248 06/23/22 1250   06/24/22 1345  vancomycin (VANCOREADY) IVPB 1250 mg/250 mL  Status:  Discontinued       See Hyperspace for full Linked Orders Report.   1,250 mg 166.7 mL/hr over 90 Minutes Intravenous Every 24 hours 06/23/22 1250 06/23/22 1251   06/24/22 1345  vancomycin (VANCOREADY) IVPB 750 mg/150 mL  Status:  Discontinued       See Hyperspace for full Linked Orders Report.   750 mg 150 mL/hr over 60 Minutes Intravenous Every 24 hours 06/23/22 1251 06/23/22 1653   06/24/22 1200  cefTRIAXone (ROCEPHIN) 2 g in sodium chloride 0.9 % 100 mL IVPB  Status:  Discontinued        2 g 200 mL/hr over 30 Minutes Intravenous Every 24 hours 06/23/22 1653 06/25/22 0929   06/23/22 1345  vancomycin (VANCOREADY) IVPB 1250 mg/250 mL  Status:  Discontinued       See Hyperspace for full Linked Orders Report.   1,250 mg 166.7 mL/hr over 90 Minutes Intravenous  Once 06/23/22 1248  06/23/22 1250   06/23/22 1345  vancomycin (VANCOREADY) IVPB 750 mg/150 mL  Status:  Discontinued       See Hyperspace for full Linked Orders Report.   750 mg 150 mL/hr over 60 Minutes Intravenous  Once 06/23/22 1250 06/23/22 1251   06/23/22 1345  vancomycin (VANCOREADY) IVPB 1250 mg/250 mL       See Hyperspace for full Linked Orders Report.   1,250 mg 166.7 mL/hr over 90 Minutes Intravenous  Once 06/23/22 1251 06/23/22 1630       Subjective: Patient was seen and examined at bedside.  Overnight events noted. Patient was sitting comfortably on the chair. He states that he is doing better,  denies any pain.   Patient has participated in physical therapy,  awaiting SNF placement.   Objective: Vitals:   07/04/22 2049 07/04/22 2220 07/05/22 0340 07/05/22 0818  BP: (!) 142/43  (!) 128/53 (!) 123/57  Pulse: (!) 41 76 67 67  Resp: 16  15 15   Temp: 98 F (36.7 C)  (!) 97.5 F (36.4 C) 97.8 F (36.6 C)  TempSrc: Oral  Oral   SpO2: 98% 100% 100% 95%  Weight:      Height:        Intake/Output Summary (Last 24 hours) at 07/05/2022 1140 Last data filed at 07/04/2022 2200 Gross per 24 hour  Intake 120 ml  Output --  Net 120 ml    Filed Weights   07/01/22 0500 07/02/22 0407 07/04/22 0127  Weight: 56.5 kg  55.4 kg 54.9 kg    Examination:  General exam: Appears comfortable, chronically ill looking, deconditioned. Respiratory system: CTA bilaterally, no accessory muscle use, respiratory effort normal, RR 15. Cardiovascular system: S1 & S2 heard, regular rate and rhythm, murmur+ Gastrointestinal system: Abdomen is soft, non tender, non distended, BS+ Central nervous system: Alert and oriented x 2, no focal neurological deficits. Extremities: No edema, no cyanosis, no clubbing Skin: No rashes, lesions or ulcers  Data Reviewed: I have personally reviewed following labs and imaging studies  CBC: Recent Labs  Lab 07/05/22 0302  WBC 4.7  HGB 11.0*  HCT 32.5*  MCV 88.3  PLT 716    Basic Metabolic Panel: Recent Labs  Lab 07/05/22 0302  NA 138  K 4.3  CL 103  CO2 25  GLUCOSE 120*  BUN 36*  CREATININE 1.25*  CALCIUM 8.6*  MG 2.2  PHOS 4.1    GFR: Estimated Creatinine Clearance: 26.2 mL/min (A) (by C-G formula based on SCr of 1.25 mg/dL (H)). Liver Function Tests: No results for input(s): "AST", "ALT", "ALKPHOS", "BILITOT", "PROT", "ALBUMIN" in the last 168 hours. No results for input(s): "LIPASE", "AMYLASE" in the last 168 hours. No results for input(s): "AMMONIA" in the last 168 hours. Coagulation Profile: No results for input(s): "INR", "PROTIME" in the last 168 hours. Cardiac Enzymes: No results for input(s): "CKTOTAL", "CKMB", "CKMBINDEX", "TROPONINI" in the last 168 hours. BNP (last 3 results) No results for input(s): "PROBNP" in the last 8760 hours. HbA1C: No results for input(s): "HGBA1C" in the last 72 hours. CBG: Recent Labs  Lab 07/04/22 1157 07/04/22 1618 07/04/22 1943 07/05/22 0815 07/05/22 1135  GLUCAP 222* 70 211* 122* 87    Lipid Profile: No results for input(s): "CHOL", "HDL", "LDLCALC", "TRIG", "CHOLHDL", "LDLDIRECT" in the last 72 hours. Thyroid Function Tests: No results for input(s): "TSH", "T4TOTAL", "FREET4", "T3FREE", "THYROIDAB" in the last 72 hours. Anemia Panel: No results for input(s): "VITAMINB12", "FOLATE", "FERRITIN", "TIBC", "IRON", "RETICCTPCT" in the last 72 hours. Sepsis Labs: No results for input(s): "PROCALCITON", "LATICACIDVEN" in the last 168 hours.  No results found for this or any previous visit (from the past 240 hour(s)).   Radiology Studies: No results found.  Scheduled Meds:  aspirin EC  81 mg Oral Q breakfast   budesonide  3 mg Oral Daily   enalapril  10 mg Oral Daily   feeding supplement  237 mL Oral TID PC   finasteride  5 mg Oral Daily   heparin  5,000 Units Subcutaneous Q8H   insulin aspart  0-15 Units Subcutaneous TID WC   melatonin  5 mg Oral QHS   memantine  10 mg Oral BID    metoprolol tartrate  12.5 mg Oral BID   mirabegron ER  25 mg Oral Daily   multivitamin with minerals  1 tablet Oral Daily   pravastatin  80 mg Oral Daily   QUEtiapine  75 mg Oral QHS   tamsulosin  0.4 mg Oral QPC supper   Continuous Infusions:   LOS: 12 days   Time spent: 20 mins  Andreia Gandolfi Marry Guan, MD [facility]   If 7PM-7AM, please contact night-coverage

## 2022-07-05 NOTE — Plan of Care (Signed)

## 2022-07-06 DIAGNOSIS — S0083XA Contusion of other part of head, initial encounter: Secondary | ICD-10-CM | POA: Diagnosis not present

## 2022-07-06 DIAGNOSIS — Z8249 Family history of ischemic heart disease and other diseases of the circulatory system: Secondary | ICD-10-CM | POA: Diagnosis not present

## 2022-07-06 DIAGNOSIS — R069 Unspecified abnormalities of breathing: Secondary | ICD-10-CM | POA: Diagnosis not present

## 2022-07-06 DIAGNOSIS — M47817 Spondylosis without myelopathy or radiculopathy, lumbosacral region: Secondary | ICD-10-CM | POA: Diagnosis not present

## 2022-07-06 DIAGNOSIS — E785 Hyperlipidemia, unspecified: Secondary | ICD-10-CM | POA: Diagnosis not present

## 2022-07-06 DIAGNOSIS — Z7689 Persons encountering health services in other specified circumstances: Secondary | ICD-10-CM | POA: Diagnosis not present

## 2022-07-06 DIAGNOSIS — Y92129 Unspecified place in nursing home as the place of occurrence of the external cause: Secondary | ICD-10-CM | POA: Diagnosis not present

## 2022-07-06 DIAGNOSIS — M160 Bilateral primary osteoarthritis of hip: Secondary | ICD-10-CM | POA: Diagnosis not present

## 2022-07-06 DIAGNOSIS — E876 Hypokalemia: Secondary | ICD-10-CM | POA: Diagnosis not present

## 2022-07-06 DIAGNOSIS — E118 Type 2 diabetes mellitus with unspecified complications: Secondary | ICD-10-CM | POA: Diagnosis not present

## 2022-07-06 DIAGNOSIS — E860 Dehydration: Secondary | ICD-10-CM | POA: Diagnosis not present

## 2022-07-06 DIAGNOSIS — M1990 Unspecified osteoarthritis, unspecified site: Secondary | ICD-10-CM | POA: Diagnosis not present

## 2022-07-06 DIAGNOSIS — R404 Transient alteration of awareness: Secondary | ICD-10-CM | POA: Diagnosis not present

## 2022-07-06 DIAGNOSIS — S0990XA Unspecified injury of head, initial encounter: Secondary | ICD-10-CM | POA: Diagnosis not present

## 2022-07-06 DIAGNOSIS — Z66 Do not resuscitate: Secondary | ICD-10-CM | POA: Diagnosis not present

## 2022-07-06 DIAGNOSIS — D72829 Elevated white blood cell count, unspecified: Secondary | ICD-10-CM | POA: Diagnosis not present

## 2022-07-06 DIAGNOSIS — M47812 Spondylosis without myelopathy or radiculopathy, cervical region: Secondary | ICD-10-CM | POA: Diagnosis not present

## 2022-07-06 DIAGNOSIS — Z79899 Other long term (current) drug therapy: Secondary | ICD-10-CM | POA: Diagnosis not present

## 2022-07-06 DIAGNOSIS — Z781 Physical restraint status: Secondary | ICD-10-CM | POA: Diagnosis not present

## 2022-07-06 DIAGNOSIS — I110 Hypertensive heart disease with heart failure: Secondary | ICD-10-CM | POA: Diagnosis not present

## 2022-07-06 DIAGNOSIS — I350 Nonrheumatic aortic (valve) stenosis: Secondary | ICD-10-CM | POA: Diagnosis not present

## 2022-07-06 DIAGNOSIS — Z7982 Long term (current) use of aspirin: Secondary | ICD-10-CM | POA: Diagnosis not present

## 2022-07-06 DIAGNOSIS — W19XXXA Unspecified fall, initial encounter: Secondary | ICD-10-CM | POA: Diagnosis present

## 2022-07-06 DIAGNOSIS — K52832 Lymphocytic colitis: Secondary | ICD-10-CM | POA: Diagnosis not present

## 2022-07-06 DIAGNOSIS — R918 Other nonspecific abnormal finding of lung field: Secondary | ICD-10-CM | POA: Diagnosis not present

## 2022-07-06 DIAGNOSIS — Z7984 Long term (current) use of oral hypoglycemic drugs: Secondary | ICD-10-CM | POA: Diagnosis not present

## 2022-07-06 DIAGNOSIS — G4489 Other headache syndrome: Secondary | ICD-10-CM | POA: Diagnosis not present

## 2022-07-06 DIAGNOSIS — Z955 Presence of coronary angioplasty implant and graft: Secondary | ICD-10-CM | POA: Diagnosis not present

## 2022-07-06 DIAGNOSIS — F03918 Unspecified dementia, unspecified severity, with other behavioral disturbance: Secondary | ICD-10-CM | POA: Diagnosis not present

## 2022-07-06 DIAGNOSIS — I2510 Atherosclerotic heart disease of native coronary artery without angina pectoris: Secondary | ICD-10-CM | POA: Diagnosis not present

## 2022-07-06 DIAGNOSIS — N400 Enlarged prostate without lower urinary tract symptoms: Secondary | ICD-10-CM | POA: Diagnosis not present

## 2022-07-06 DIAGNOSIS — I5032 Chronic diastolic (congestive) heart failure: Secondary | ICD-10-CM | POA: Diagnosis not present

## 2022-07-06 DIAGNOSIS — G9341 Metabolic encephalopathy: Secondary | ICD-10-CM | POA: Diagnosis not present

## 2022-07-06 DIAGNOSIS — Z043 Encounter for examination and observation following other accident: Secondary | ICD-10-CM | POA: Diagnosis not present

## 2022-07-06 LAB — GLUCOSE, CAPILLARY
Glucose-Capillary: 116 mg/dL — ABNORMAL HIGH (ref 70–99)
Glucose-Capillary: 302 mg/dL — ABNORMAL HIGH (ref 70–99)
Glucose-Capillary: 118 mg/dL — ABNORMAL HIGH (ref 70–99)

## 2022-07-06 MED ORDER — QUETIAPINE FUMARATE 25 MG PO TABS: 75.0000 mg | ORAL_TABLET | Freq: Every day | ORAL | 0 refills | Status: DC

## 2022-07-06 MED ORDER — METOPROLOL TARTRATE 25 MG PO TABS: 12.5000 mg | ORAL_TABLET | Freq: Two times a day (BID) | ORAL | 0 refills | Status: DC

## 2022-07-06 MED ORDER — ENSURE ENLIVE PO LIQD: 237.0000 mL | Freq: Three times a day (TID) | ORAL | 12 refills | Status: DC

## 2022-07-06 NOTE — Progress Notes (Signed)
Attempted to call report on pt to [facility] three times w/o success. On fourth time got answering machine for intended floor. Doctor, general practice . Was told to call [facility] so pt can be transferred before 2000.

## 2022-07-06 NOTE — Progress Notes (Signed)
D/C'd IV site to left FA with catheter intact. Pt tolerated well

## 2022-07-06 NOTE — Discharge Summary (Addendum)
Physician Discharge Summary   Patient: Jordan [name] MRN: [id number] DOB: 06-Jul-1923  Admit date:     06/21/2022  Discharge date: 07/06/22  Discharge Physician: [place]   PCP: Asencion Noble, MD   Recommendations at discharge:    Follow up with PCP within 2 weeks. Consider outpatient Neurology consultation, for questions about dementia. Please check BMP in 1-2 weeks to assess renal function (ACE inhibitor has been held).  Discharge Diagnoses:   Dementia with behavioral disturbance ([place])   Type 2 diabetes mellitus with complication (HCC)   Hypertension   Hyperlipidemia   Acute encephalopathy  Hospital Course: 88 yrs old male with PMH significant for Aortic stenosis (not TAVR candidate) dementia, HTN, BPH, DM-2, HLD, lymphocytic colitis-who presented to the ED with altered mental status.  Patient was transferred to [facility] for LTM EEG / LP/ Neurology evaluation. After previous hospitalist had long conversation with daughter, there are no plans to pursue LP.  Patient is awaiting SNF placement and is medically cleared for discharge. Has been doing well with Seroquel started.   Assessment and Plan: * Encephalopathy - Agitation and behavioral disturbance for 3 days - CT head shows no acute intracranial abnormality - MRI brain on December 5 shows no acute intracranial process.  No evidence of acute or subacute infarct. - Patient failed outpatient management with Remeron, Ativan, Namenda - Continue to monitor, seems to have resolved  Dementia with behavioral disturbance (HCC) - Apparently patient did not tolerate Aricept - Continue Namenda - Holding Remeron at this time -- continue Seroquel at discharge  Hyperlipidemia - Continue statin  Hypertension - holding Vasotec at DC due to marginally rising Cr, will continue metoprolol at DC and recommend recheck BMP in 1-2 weeks  Type 2 diabetes mellitus with complication ([place])     Disposition: Skilled nursing  facility  Diet recommendation:  Discharge Diet Orders (From admission, onward)     Start     Ordered   07/06/22 0000  Diet - low sodium heart healthy       Comments: Dysphagia 3, thin liquids   07/06/22 1158           Dysphagia type 3 thin Liquid  DISCHARGE MEDICATION: Allergies as of 07/06/2022   No Known Allergies      Medication List     STOP taking these medications    enalapril 10 MG tablet Commonly known as: VASOTEC   LORazepam 0.5 MG tablet Commonly known as: Ativan   mirtazapine 15 MG tablet Commonly known as: Remeron       TAKE these medications    aspirin 81 MG tablet Take 1 tablet (81 mg total) by mouth daily with breakfast.   budesonide 3 MG 24 hr capsule Commonly known as: ENTOCORT EC TAKE 1 CAPSULE BY MOUTH DAILY.   feeding supplement Liqd Take 237 mLs by mouth 3 (three) times daily after meals.   finasteride 5 MG tablet Commonly known as: PROSCAR Take 1 tablet (5 mg total) by mouth daily. as directed   memantine 10 MG tablet Commonly known as: NAMENDA Take 10 mg by mouth 2 (two) times daily.   metFORMIN 500 MG tablet Commonly known as: GLUCOPHAGE Take 500 mg by mouth at bedtime.   metoprolol tartrate 25 MG tablet Commonly known as: LOPRESSOR Take 0.5 tablets (12.5 mg total) by mouth 2 (two) times daily.   mirabegron ER 25 MG Tb24 tablet Commonly known as: MYRBETRIQ Take 1 tablet (25 mg total) by mouth daily.   pravastatin 80  MG tablet Commonly known as: PRAVACHOL Take 80 mg by mouth daily.   PRESERVISION AREDS PO Take 1 tablet by mouth daily.   QUEtiapine 25 MG tablet Commonly known as: SEROQUEL Take 3 tablets (75 mg total) by mouth at bedtime.   silodosin 8 MG Caps capsule Commonly known as: RAPAFLO Take 1 capsule (8 mg total) by mouth in the morning and at bedtime.          Contact information for after-discharge care     Destination     [facility] .   Service: Skilled  Nursing Contact information: [place] [phone]                    Discharge Exam: Danley Danker Weights   07/01/22 0500 07/02/22 0407 07/04/22 0127  Weight: 56.5 kg 55.4 kg 54.9 kg   General exam: Appears comfortable, chronically ill looking, deconditioned. Respiratory system: CTA bilaterally, no accessory muscle use, respiratory effort normal, RR 15. Cardiovascular system: S1 & S2 heard, regular rate and rhythm, murmur+ Gastrointestinal system: Abdomen is soft, non tender, non distended, BS+ Central nervous system: Alert and oriented x 2, no focal neurological deficits. Extremities: No edema, no cyanosis, no clubbing Skin: No rashes, lesions or ulcers  Condition at discharge: stable  The results of significant diagnostics from this hospitalization (including imaging, microbiology, ancillary and laboratory) are listed below for reference.   Imaging Studies: Overnight EEG with video  Result Date: 06/26/2022 Samuella Cota, MD     06/26/2022  7:58 AM EEG Procedure CPT/Type of Study: 30160; 2-12hr EEG with video Referring Provider: Ghimire Primary Neurological Diagnosis: AMS History: This is a 88 yr old patient, undergoing an EEG to evaluate for AMS. Clinical State: disoriented Technical Description: The EEG was performed using standard setting per the guidelines of American Clinical Neurophysiology Society (ACNS). A minimum of 21 electrodes were placed on scalp according to the International 10-20 or/and 10-10 Systems. Supplemental electrodes were placed as needed. Single EKG electrode was also used to detect cardiac arrhythmia. Patient's behavior was continuously recorded on video simultaneously with EEG. A minimum of 16 channels were used for data display. Each epoch of study was reviewed manually daily and as needed using standard referential and bipolar montages. Computerized quantitative EEG analysis (such as compressed spectral array  analysis, trending, automated spike & seizure detection) were used as indicated. Day 1: from 1324 06/25/22 to 1856 06/26/22 EEG Description: Overall Amplitude:Normal Predominant Frequency: The background activity showed theta/delta slowing occurring frequently Superimposed Frequencies: sparse beta activity bilaterally The background was symmetric Background Abnormalities: Generalized slowing as above Rhythmic or periodic pattern: No Epileptiform activity: no Electrographic seizures: no Events: no Breach rhythm: no Reactivity: Present Stimulation procedures: Hyperventilation: not done Photic stimulation: not done Sleep Background: Stage I EKG:no significant arrhythmia Impression: This was an abnormal continuous EEG due to diffuse background slowing, indicative of a non-specific encephalopathy pattern. No seizures or epileptiform discharges were seen.   CT CHEST ABDOMEN PELVIS W CONTRAST  Result Date: 06/25/2022 CLINICAL DATA:  Lung mass identified by CTA head and neck, evaluate for malignancy * Tracking Code: BO * EXAM: CT CHEST, ABDOMEN, AND PELVIS WITH CONTRAST TECHNIQUE: Multidetector CT imaging of the chest, abdomen and pelvis was performed following the standard protocol during bolus administration of intravenous contrast. RADIATION DOSE REDUCTION: This exam was performed according to the departmental dose-optimization program which includes automated exposure control, adjustment of the mA and/or kV according to patient size  and/or use of iterative reconstruction technique. CONTRAST:  16mL OMNIPAQUE IOHEXOL 350 MG/ML SOLN COMPARISON:  CT chest angiogram, 06/13/2016, CT abdomen pelvis, 06/17/2016 FINDINGS: CT CHEST FINDINGS Cardiovascular: Aortic atherosclerosis. Aortic valve calcifications. Normal heart size. Three-vessel coronary artery calcifications. No pericardial effusion. Mediastinum/Nodes: No enlarged mediastinal, hilar, or axillary lymph nodes. Thyroid gland, trachea, and esophagus demonstrate no  significant findings. Lungs/Pleura: In comparison to most recent examination of the chest dated 06/13/2016, there has been significant interval increase in size and solid character of a previously seen subsolid opacity of the peripheral left upper lobe, now measuring 2.6 x 2.2 cm, previously 2.1 x 1.4 cm (series 5, image 37). Mild underlying centrilobular emphysema. Diffuse bilateral bronchial wall thickening. Small bilateral pleural effusions. No pleural effusion or pneumothorax. Musculoskeletal: No chest wall abnormality. No acute osseous findings. CT ABDOMEN PELVIS FINDINGS Hepatobiliary: No solid liver abnormality is seen. No gallstones, gallbladder wall thickening, or biliary dilatation. Pancreas: Unremarkable. No pancreatic ductal dilatation or surrounding inflammatory changes. Spleen: Normal in size without significant abnormality. Adrenals/Urinary Tract: Adrenal glands are unremarkable. Kidneys are normal, without renal calculi, solid lesion, or hydronephrosis. Bladder is unremarkable. Stomach/Bowel: Stomach is within normal limits. Diverticulum of the descending duodenum. Appendix appears normal. No evidence of bowel wall thickening, distention, or inflammatory changes. Pancolonic diverticulosis. Vascular/Lymphatic: Aortic atherosclerosis. Interval enlargement of an aneurysm of the infrarenal abdominal aorta measuring up to 4.4 x 3.8 cm, previously 3.8 x 3.7 cm (series 3, image 68). No enlarged abdominal or pelvic lymph nodes. Reproductive: Prostatomegaly. Other: Small, fat containing left inguinal hernia.  No ascites. Musculoskeletal: No acute osseous findings. IMPRESSION: 1. In comparison to most recent examination of the chest dated 06/13/2016, there has been significant interval increase in size and solid character of a previously seen subsolid opacity of the peripheral left upper lobe, now measuring 2.6 x 2.2 cm, previously 2.1 x 1.4 cm. This is highly concerning for indolent adenocarcinoma. PET-CT  characterization and tissue sampling can be considered if clinically appropriate given advanced patient age. 2. No evidence of lymphadenopathy or metastatic disease in the chest, abdomen, or pelvis. 3. Mild emphysema and diffuse bilateral bronchial wall thickening. 4. Small bilateral pleural effusions. 5. Interval enlargement of an aneurysm of the infrarenal abdominal aorta measuring up to 4.4 x 3.8 cm, previously 3.8 x 3.7 cm. Recommend follow-up every 12 months and vascular consultation if clinically appropriate. This recommendation follows ACR consensus guidelines: White Paper of the ACR Incidental Findings Committee II on Vascular Findings. J Am Coll Radiol 2013; 10:789-794. 6. Prostatomegaly. 7. Coronary artery disease. Aortic Atherosclerosis (ICD10-I70.0) and Emphysema (ICD10-J43.9). Electronically Signed   By: Delanna Ahmadi M.D.   On: 06/25/2022 16:44   EEG adult  Result Date: 06/23/2022 Landry Corporal, MD     06/23/2022  3:32 PM [facility] Routine EEG Report Demographics: Patient Name:   [name] Date of Birth:   [birth year] Identification Number:   MRN - [id number] Study Times: Study Start Time:   06/23/2022 14:19:56 Study End Time:   06/23/2022 14:43:35 Duration:   23 minutes Indication(s): Encephalopathy Technical Summary: This EEG was performed utilizing standard International 10-20 System of electrode placement. Data were obtained and interpreted utilizing referential montage recording, with reformatting to longitudinal, transverse bipolar, and referential montages as necessary for interpretation. State(s):       Awake Activation Procedures: Hyperventilation: Not performed Photic Stimulation: Not performed EEG Description: No normal patterns of awake or sleep were present during this recording. Intermittent generalized theta-delta slowing was present.  Excess continuous generalized theta-delta slowing was present. Minor muscle, motion,  electrode, and eye movement artifacts were occasionally noted. Impression: Abnormal EEG due to:   1) No normal patterns of awake or sleep  2) Intermittent generalized theta-delta slowing  3) Continuous generalized theta-delta slowing  Clinical Correlation: This EEG is suggestive of a moderate encephalopathy, but is nonspecific as to etiology. The absence of epileptiform abnormalities does not preclude a clinical diagnosis of seizures. [facility] For Inpatient follow-up with [facility] physician please call [facility] [phone]. This is not an outpatient service. Post hospital discharge, please contact hospital directly. Please do not communicate with [facility] physicians via secure chat. If you have any questions, Please contact [facility].  CT Head Wo Contrast  Result Date: 06/22/2022 CLINICAL DATA:  Altered mental status EXAM: CT HEAD WITHOUT CONTRAST TECHNIQUE: Contiguous axial images were obtained from the base of the skull through the vertex without intravenous contrast. RADIATION DOSE REDUCTION: This exam was performed according to the departmental dose-optimization program which includes automated exposure control, adjustment of the mA and/or kV according to patient size and/or use of iterative reconstruction technique. COMPARISON:  None Available. FINDINGS: Brain: No evidence of acute infarction, hemorrhage, hydrocephalus, extra-axial collection or mass lesion/mass effect. Advanced parenchymal volume loss with ex vacuo dilation of the ventricles again noted. Mild periventricular white matter changes are present likely reflecting the sequela of small vessel ischemia. Vascular: No hyperdense vessel or unexpected calcification. Skull: Normal. Negative for fracture or focal lesion. Sinuses/Orbits: No acute finding. Other: Mastoid air cells and middle ear cavities are clear. IMPRESSION: 1. No acute intracranial abnormality. 2. Advanced parenchymal volume loss with ex vacuo dilation of the ventricles  again noted. 3. Mild periventricular white matter changes likely reflecting the sequela of small vessel ischemia. Electronically Signed   By: Fidela Salisbury M.D.   On: 06/22/2022 01:04   MR BRAIN WO CONTRAST  Result Date: 06/20/2022 CLINICAL DATA:  Left-sided facial droop, generalized weakness, stroke suspected EXAM: MRI HEAD WITHOUT CONTRAST TECHNIQUE: Multiplanar, multiecho pulse sequences of the brain and surrounding structures were obtained without intravenous contrast. COMPARISON:  No prior MRI, correlation is made with CT head 06/20/2022 FINDINGS: Brain: No restricted diffusion to suggest acute or subacute infarct. No acute hemorrhage, mass, mass effect, or midline shift. No hydrocephalus or extra-axial collection. Redemonstrated advanced cerebral atrophy, with ex vacuo dilatation the ventricles. Vascular: Normal arterial flow voids. Skull and upper cervical spine: Evaluation is somewhat motion limited, but marrow signal is grossly normal. Sinuses/Orbits: Mild mucosal thickening in the ethmoid air cells. Other: The mastoids are well aerated. IMPRESSION: No acute intracranial process. No evidence of acute or subacute infarct. Electronically Signed   By: Merilyn Baba M.D.   On: 06/20/2022 16:39   EEG adult  Result Date: 06/20/2022 Landry Corporal, MD     06/20/2022  6:10 PM [facility] Routine EEG Report Demographics: Patient Name:   [name] Date of Birth:   [birth year] Identification Number:   MRN - [id number] Study Times: Duration:   24 minutes Indication(s): Encephalopathy Technical Summary: This EEG was performed utilizing standard International 10-20 System of electrode placement. Data were obtained, stored, and interpreted utilizing referential montage recording, with reformatting to longitudinal, transverse bipolar, and referential montages as necessary for interpretation. State(s):       Awake      Drowsy      Asleep Activation  Procedures: Hyperventilation: Not performed Photic Stimulation: Not performed EEG Description: No normal patterns of awake or sleep were present  during this recording. Intermittent generalized theta-delta slowing was present. Excess continuous generalized theta-delta slowing was present. Occasional severe myogenic artifact precluded a more thorough interpretation of this recording. Impression: Abnormal EEG due to:   1) No normal patterns of awake or sleep  2) Intermittent generalized theta-delta slowing  3) Continuous generalized theta-delta slowing  Clinical Correlation: This EEG is suggestive of a moderate encephalopathy, but is nonspecific as to etiology. The absence of epileptiform abnormalities does not preclude a clinical diagnosis of seizures. Nicolette Bang) [place] Board of Psychiatry and Neurology (ABPN); American Board of Clinical Neurophysiology  (ABCN) [facility] For Inpatient follow-up with [facility] physician please call [facility] [phone]. This is not an outpatient service. Post hospital discharge, please contact hospital directly.   DG Chest Portable 1 View  Result Date: 06/20/2022 CLINICAL DATA:  Altered mental status. EXAM: PORTABLE CHEST 1 VIEW COMPARISON:  02/28/2021 FINDINGS: Midline trachea. Mild cardiomegaly. Atherosclerosis in the transverse aorta. No pleural effusion or pneumothorax. Nonspecific mild interstitial prominence could be related to remote smoking history. No lobar consolidation. IMPRESSION: No acute cardiopulmonary disease. Aortic Atherosclerosis (ICD10-I70.0). Electronically Signed   By: Abigail Miyamoto M.D.   On: 06/20/2022 12:29   CT ANGIO HEAD NECK W WO CM (CODE STROKE)  Result Date: 06/20/2022 CLINICAL DATA:  Left-sided facial droop. EXAM: CT ANGIOGRAPHY HEAD AND NECK TECHNIQUE: Multidetector CT imaging of the head and neck was performed using the standard protocol during bolus administration of intravenous contrast. Multiplanar CT image  reconstructions and MIPs were obtained to evaluate the vascular anatomy. Carotid stenosis measurements (when applicable) are obtained utilizing NASCET criteria, using the distal internal carotid diameter as the denominator. RADIATION DOSE REDUCTION: This exam was performed according to the departmental dose-optimization program which includes automated exposure control, adjustment of the mA and/or kV according to patient size and/or use of iterative reconstruction technique. CONTRAST:  58mL OMNIPAQUE IOHEXOL 350 MG/ML SOLN COMPARISON:  Same day CT brain FINDINGS: CT HEAD FINDINGS See same day CT brain for additional findings. CTA NECK FINDINGS Aortic arch: Standard branching. Imaged portion shows no evidence of aneurysm or dissection. No significant stenosis of the major arch vessel origins. Right carotid system: Approximately 75% stenosis at the origin of the right ICA. Left carotid system: Mild stenosis of the origin of the left ICA. Vertebral arteries: Codominant. No evidence of dissection, stenosis (50% or greater), or occlusion. Skeleton: There is large degenerative pannus at C1-C2 that results in of the spinal canal. There is also a calcified disc extrusion at C5-C6 that results in mild-to-moderate spinal canal narrowing. Other neck: Negative. Upper chest: Peripheral subsolid pulmonary opacity has increased in size compared to 2017 and is suspicious for malignancy. Review of the MIP images confirms the above findings CTA HEAD FINDINGS Anterior circulation: Hyperdense focus seen on same day CT brain correlates with a calcified focal plaque of uncertain clinical significance. Posterior circulation: There is moderate to severe focal stenosis in the distal P1 and proximal P2 segments of the left PCA (series 5, image 218, 2-5). Venous sinuses: As permitted by contrast timing, patent. Anatomic variants: None Review of the MIP images confirms the above findings IMPRESSION: 1. No evidence of large vessel occlusion. 2.  Severe focal stenoses in the distal P1 and proximal P2 segments of the left PCA. 3. Approximately 75% stenosis at the origin of the right ICA. 4. Peripheral subsolid pulmonary opacity has increased in size compared to 2017 and is suspicious for malignancy. Recommend further evaluation with a dedicated chest CT. Electronically  Signed   By: Marin Roberts M.D.   On: 06/20/2022 12:19   CT HEAD CODE STROKE WO CONTRAST  Result Date: 06/20/2022 CLINICAL DATA:  Code stroke. Neuro deficit, acute, stroke suspected. Left-sided facial droop. Generalized weakness. Altered mental status. EXAM: CT HEAD WITHOUT CONTRAST TECHNIQUE: Contiguous axial images were obtained from the base of the skull through the vertex without intravenous contrast. RADIATION DOSE REDUCTION: This exam was performed according to the departmental dose-optimization program which includes automated exposure control, adjustment of the mA and/or kV according to patient size and/or use of iterative reconstruction technique. COMPARISON:  Head CT 08/20/2009. FINDINGS: Brain: Moderate cerebral atrophy. Mild patchy and ill-defined hypoattenuation within the cerebral white matter, nonspecific but compatible with chronic small vessel ischemic disease. Redemonstrated chronic lacunar infarct within the right caudate nucleus. Redemonstrated small chronic lacunar infarct versus prominent perivascular space within the left lentiform nucleus. There is no acute intracranial hemorrhage. No demarcated cortical infarct. No extra-axial fluid collection. No evidence of an intracranial mass. No midline shift. Vascular: Atherosclerotic calcifications. Small calcified focus at the anterior aspect of the right sylvian fissure, new from the prior head CT head CT of 08/20/2009 and possibly reflecting calcified embolic plaque (series 2, image 16) (series 5, image 19). Skull: No fracture or aggressive osseous lesion. Sinuses/Orbits: No mass or acute finding within the imaged orbits.  Small volume frothy secretions within the left sphenoid sinus. Other: Small left forehead hematoma. Subcutaneous right forehead lipoma measuring 1.6 x 0.4 cm (series 2, image 18). ASPECTS ([place] Stroke Program Early CT Score) - Ganglionic level infarction (caudate, lentiform nuclei, internal capsule, insula, M1-M3 cortex): 7 - Supraganglionic infarction (M4-M6 cortex): 3 Total score (0-10 with 10 being normal): 10 Impressions #1 and #2 called by telephone at the time of interpretation on 06/20/2022 at 11:40 am to provider Davonna Belling , who verbally acknowledged these results. IMPRESSION: 1. No evidence of acute intracranial hemorrhage or acute infarct. 2. Small calcified focus at the anterior aspect of the right sylvian fissure, new from the prior head CT of 08/20/2009 and possibly reflecting calcified embolic plaque. 3. Mild chronic small vessel ischemic changes within the cerebral white matter. 4. Redemonstrated small chronic lacunar infarct within the right caudate nucleus, and possible small chronic lacunar infarct within the left lentiform nucleus. 5. Moderate cerebral atrophy. 6. Small left forehead hematoma. 7. 1.6 cm subcutaneous right forehead lipoma. 8. Left sphenoid sinusitis. Electronically Signed   By: Kellie Simmering D.O.   On: 06/20/2022 11:42    Microbiology: Results for orders placed or performed during the hospital encounter of 06/21/22  Culture, blood (Routine X 2) w Reflex to ID Panel     Status: Abnormal   Collection Time: 06/22/22  5:03 PM   Specimen: Left Antecubital; Blood  Result Value Ref Range Status   Specimen Description   Final    LEFT ANTECUBITAL Performed at [facility], [address], [place], [place] [address]    Special Requests   Final    BOTTLES DRAWN AEROBIC AND ANAEROBIC Blood Culture adequate volume Performed at [facility], [address] [place], [place] [address]    Culture  Setup Time   Final    GRAM POSITIVE COCCI IN BOTH AEROBIC AND ANAEROBIC  BOTTLES Gram Stain Report Called to,Read Back By and Verified With: COLEMAN @ 0927 ON [id number] BY HENDERSON L CRITICAL RESULT CALLED TO, READ BACK BY AND VERIFIED WITH: PHARMD LAURIE POOLE ON 06/23/22 @ 1619 BY DRT    Culture (A)  Final  STAPHYLOCOCCUS WARNERI THE SIGNIFICANCE OF ISOLATING THIS ORGANISM FROM A SINGLE SET OF BLOOD CULTURES WHEN MULTIPLE SETS ARE DRAWN IS UNCERTAIN. PLEASE NOTIFY THE MICROBIOLOGY DEPARTMENT WITHIN ONE WEEK IF SPECIATION AND SENSITIVITIES ARE REQUIRED. Performed at [facility], [place] [address], [place], [place] [address]    Report Status 06/25/2022 FINAL  Final  Culture, blood (Routine X 2) w Reflex to ID Panel     Status: None   Collection Time: 06/22/22  5:03 PM   Specimen: Right Antecubital; Blood  Result Value Ref Range Status   Specimen Description RIGHT ANTECUBITAL  Final   Special Requests   Final    BOTTLES DRAWN AEROBIC AND ANAEROBIC Blood Culture adequate volume   Culture   Final    NO GROWTH 5 DAYS Performed at [facility], [address], [place], [place] [address]    Report Status 06/27/2022 FINAL  Final  Blood Culture ID Panel (Reflexed)     Status: Abnormal   Collection Time: 06/22/22  5:03 PM  Result Value Ref Range Status   Enterococcus faecalis NOT DETECTED NOT DETECTED Final   Enterococcus Faecium NOT DETECTED NOT DETECTED Final   Listeria monocytogenes NOT DETECTED NOT DETECTED Final   Staphylococcus species DETECTED (A) NOT DETECTED Final    Comment: CRITICAL RESULT CALLED TO, READ BACK BY AND VERIFIED WITH: [place] ON 06/23/22 @ 1619 BY DRT    Staphylococcus aureus (BCID) NOT DETECTED NOT DETECTED Final   Staphylococcus epidermidis NOT DETECTED NOT DETECTED Final   Staphylococcus lugdunensis NOT DETECTED NOT DETECTED Final   Streptococcus species NOT DETECTED NOT DETECTED Final   Streptococcus agalactiae NOT DETECTED NOT DETECTED Final   Streptococcus pneumoniae NOT DETECTED NOT DETECTED Final   Streptococcus  pyogenes NOT DETECTED NOT DETECTED Final   A.calcoaceticus-baumannii NOT DETECTED NOT DETECTED Final   Bacteroides fragilis NOT DETECTED NOT DETECTED Final   Enterobacterales NOT DETECTED NOT DETECTED Final   Enterobacter cloacae complex NOT DETECTED NOT DETECTED Final   Escherichia coli NOT DETECTED NOT DETECTED Final   Klebsiella aerogenes NOT DETECTED NOT DETECTED Final   Klebsiella oxytoca NOT DETECTED NOT DETECTED Final   Klebsiella pneumoniae NOT DETECTED NOT DETECTED Final   Proteus species NOT DETECTED NOT DETECTED Final   Salmonella species NOT DETECTED NOT DETECTED Final   Serratia marcescens NOT DETECTED NOT DETECTED Final   Haemophilus influenzae NOT DETECTED NOT DETECTED Final   Neisseria meningitidis NOT DETECTED NOT DETECTED Final   Pseudomonas aeruginosa NOT DETECTED NOT DETECTED Final   Stenotrophomonas maltophilia NOT DETECTED NOT DETECTED Final   Candida albicans NOT DETECTED NOT DETECTED Final   Candida auris NOT DETECTED NOT DETECTED Final   Candida glabrata NOT DETECTED NOT DETECTED Final   Candida krusei NOT DETECTED NOT DETECTED Final   Candida parapsilosis NOT DETECTED NOT DETECTED Final   Candida tropicalis NOT DETECTED NOT DETECTED Final   Cryptococcus neoformans/gattii NOT DETECTED NOT DETECTED Final    Comment: Performed at [facility], [place]. [address], [place], [place] [address]  MRSA Next Gen by PCR, Nasal     Status: None   Collection Time: 06/23/22  5:27 PM   Specimen: Nasal Mucosa; Nasal Swab  Result Value Ref Range Status   MRSA by PCR Next Gen NOT DETECTED NOT DETECTED Final    Comment: (NOTE) The GeneXpert MRSA Assay (FDA approved for NASAL specimens only), is one component of a comprehensive MRSA colonization surveillance program. It is not intended to diagnose MRSA infection nor  to guide or monitor treatment for MRSA infections. Test performance is not FDA approved in patients less than 45 years old. Performed at [facility], [address] [place], [place] [address]   Culture, blood (Routine X 2) w Reflex to ID Panel     Status: None   Collection Time: 06/24/22  8:55 AM   Specimen: Right Antecubital  Result Value Ref Range Status   Specimen Description   Final    RIGHT ANTECUBITAL BOTTLES DRAWN AEROBIC AND ANAEROBIC   Special Requests   Final    Blood Culture results may not be optimal due to an excessive volume of blood received in culture bottles   Culture   Final    NO GROWTH 5 DAYS Performed at [facility], [address] [place], [place] [address]    Report Status 06/29/2022 FINAL  Final  Culture, blood (Routine X 2) w Reflex to ID Panel     Status: None   Collection Time: 06/24/22  8:55 AM   Specimen: BLOOD RIGHT HAND  Result Value Ref Range Status   Specimen Description   Final    BLOOD RIGHT HAND BOTTLES DRAWN AEROBIC AND ANAEROBIC   Special Requests   Final    Blood Culture results may not be optimal due to an excessive volume of blood received in culture bottles   Culture   Final    NO GROWTH 5 DAYS Performed at [facility], [address], [place], [place] [address]    Report Status 06/29/2022 FINAL  Final    Labs: CBC: Recent Labs  Lab 07/05/22 0302  WBC 4.7  HGB 11.0*  HCT 32.5*  MCV 88.3  PLT 778   Basic Metabolic Panel: Recent Labs  Lab 07/05/22 0302  NA 138  K 4.3  CL 103  CO2 25  GLUCOSE 120*  BUN 36*  CREATININE 1.25*  CALCIUM 8.6*  MG 2.2  PHOS 4.1   Liver Function Tests: No results for input(s): "AST", "ALT", "ALKPHOS", "BILITOT", "PROT", "ALBUMIN" in the last 168 hours. CBG: Recent Labs  Lab 07/05/22 1135 07/05/22 1645 07/05/22 2200 07/06/22 0750 07/06/22 1133  GLUCAP 87 154* 160* 116* 302*    Discharge time spent: greater than 30 minutes.  Signed: Ryah Cribb Marry Guan, MD [facility] 07/06/2022

## 2022-07-06 NOTE — Plan of Care (Signed)

## 2022-07-06 NOTE — Progress Notes (Addendum)
1pm: CSW spoke with patient's daughter [name] who states she is en route to [facility] now to sign paperwork.  Patient will go to room 206. The patient will be transported via [facility]. The number to call for report is [phone]. RN to call report and PTAR when ready.  10am: Patient's insurance authorization has been approved.   CSW spoke with patient's daughter [name] to inform her of information. [name] will call Janie at the facility to schedule a time to complete paperwork.  Madilyn Fireman, MSW, LCSW Transitions of Care  Clinical Social Worker II [phone]

## 2022-07-06 NOTE — Progress Notes (Incomplete)
Patient ID: [name], male   DOB: [birth year], 88 y.o.   MRN: [id number]    Progress Note from the Palliative Medicine Team at [facility]   Patient Name: [name]        Date: 07/06/2022 DOB: 1923-04-28  Age: 88 y.o. MRN#: [id number] Attending Physician: Ikramullah, [place] Physician: Asencion Noble, MD Admit Date: 06/21/2022   Medical records reviewed, assessed patient at bedside discussed with Daughter/[name]  by telephone     88 y.o.  male with history of aortic stenosis (not TAVR candidate) dementia( family report mild), HTN, BPH, DM-2, HLD, lymphocytic colitis-who presented to altered mental status.  Patient was transferred to [facility] for LTM EEG/LP/neurology evaluation.     Significant events: 12/05>> [facility] ED visit for weakness/falls/confusion.  Discharged home. 12/07>> admit to [facility] for worsening confusion 12/10>> transferred to [facility] for neurology/LTM EEG/possible LP-given ongoing confusion.   Significant studies: 12/05>> CT head: No acute intracranial abnormality 12/05>> CTA head/neck: No LVO, 75% stenosis of right ICA at the origin. 12/05>> MRI brain: No acute/subacute CVA show 12/05>> EEG: No seizures.   12/07>> CT head: No acute intracranial abnormality. 12/07>> TSH: 1.35 12/08>> EEG: No seizures. 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  12/19- stable for discharge, family having difficult time making decision for SNF- she wants to ensure quality of facility before transfer     This NP assessed patient at the bedside as a follow up for palliative medicine needs and emotional support.   Patient is out of bed to the chair , eating 100% of his breakfast, arouses easily to verbal stimulation.   I spoke  with daughter/ [name] by telephone, responding to daughter's request for phone  call  Continued exploration and consideration of next steps in the plan of care.   Education offered on the natural trajectory and expectations of the diagnosis of dementia.    Education offered on the difference between a full medical support/life prolonging path vs a palliative comfort path.  Education offered on hospice benefit; philosophy and eligibility.   Plan of Care:      -DNR/DNI -no artificial feeding now or in the future -Transition to SNF for short-term rehab, with intention of ultimately returning home with daughter        Education offered on the likely trajectory of continued physical, functional and cognitive decline and the importance of preparation and consideration for anticipatory care needs in the home.   Education on self-referral for in-home hospice  Education offered today regarding  the importance of continued conversation with the medical providers regarding overall plan of care and treatment options,  ensuring decisions are within the context of the patients values and GOCs.  Family encouraged to call with questions or concerns   Questions and concerns addressed     5 minutes  Wadie Lessen NP  Palliative Medicine Team Team Phone # [phone] Pager [phone]

## 2022-07-06 NOTE — Progress Notes (Signed)
Physical Therapy Treatment Patient Details Name: Jordan [name] MRN: [id number] DOB: [birth year] Today's Date: 07/06/2022   History of Present Illness 88 yo male presents to [facility] on 12/5 with AMS, neuro and UA clear so d/c. Pt back to ED on 12/7 with continued AMS with combativeness. PMH includes HTN, DMII, dementia, HLD,  aortic stenosis, BPH, CAD, macular degeneration and is legally blind.    PT Comments    Pt pleasant and able to increased ambulation distance with improved posture and balance this session. Pt fatigued after gait trials and has difficulty following single step commands for HEP. In chair with alarm end of session.     Recommendations for follow up therapy are one component of a multi-disciplinary discharge planning process, led by the attending physician.  Recommendations may be updated based on patient status, additional functional criteria and insurance authorization.  Follow Up Recommendations  Skilled nursing-short term rehab (<3 hours/day) Can patient physically be transported by private vehicle: Yes   Assistance Recommended at Discharge Frequent or constant Supervision/Assistance  Patient can return home with the following A lot of help with bathing/dressing/bathroom;Assist for transportation;Assistance with feeding;Direct supervision/assist for financial management;Direct supervision/assist for medications management;Assistance with cooking/housework;A little help with walking and/or transfers   Equipment Recommendations  None recommended by PT    Recommendations for Other Services       Precautions / Restrictions Precautions Precautions: Fall Precaution Comments: incontinent, legally blind     Mobility  Bed Mobility Overal bed mobility: Needs Assistance Bed Mobility: Supine to Sit     Supine to sit: Min assist     General bed mobility comments: min assist to initiate movement toward EOB, cues for safety and sequence    Transfers Overall  transfer level: Needs assistance   Transfers: Sit to/from Stand Sit to Stand: Min assist           General transfer comment: min assist to rise from bed and recliner with cues for hand placement, improved anterior translation    Ambulation/Gait Ambulation/Gait assistance: Min assist Gait Distance (Feet): 140 Feet Assistive device: Rolling walker (2 wheels) Gait Pattern/deviations: Step-through pattern, Decreased stride length   Gait velocity interpretation: <1.8 ft/sec, indicate of risk for recurrent falls   General Gait Details: pt with improved BOS with RW with min assist to control and direct RW. pt walked 140' x 2 with seated rest between trials   Stairs             Wheelchair Mobility    Modified Rankin (Stroke Patients Only)       Balance Overall balance assessment: Needs assistance   Sitting balance-Leahy Scale: Fair Sitting balance - Comments: static sitting EOB   Standing balance support: Bilateral upper extremity supported, Reliant on assistive device for balance Standing balance-Leahy Scale: Poor Standing balance comment: bil UE on RW for standing                            Cognition Arousal/Alertness: Awake/alert Behavior During Therapy: Flat affect Overall Cognitive Status: No family/caregiver present to determine baseline cognitive functioning                                 General Comments: followed directions well with increased time and/or repeated directions.        Exercises General Exercises - Lower Extremity Long Arc Quad: AROM, Both, 15 reps, Seated  General Comments        Pertinent Vitals/Pain Pain Assessment Pain Assessment: No/denies pain    Home Living                          Prior Function            PT Goals (current goals can now be found in the care plan section) Progress towards PT goals: Progressing toward goals    Frequency    Min 2X/week      PT Plan  Current plan remains appropriate    Co-evaluation              AM-PAC PT "6 Clicks" Mobility   Outcome Measure  Help needed turning from your back to your side while in a flat bed without using bedrails?: A Little Help needed moving from lying on your back to sitting on the side of a flat bed without using bedrails?: A Little Help needed moving to and from a bed to a chair (including a wheelchair)?: A Little Help needed standing up from a chair using your arms (e.g., wheelchair or bedside chair)?: A Little Help needed to walk in hospital room?: A Little Help needed climbing 3-5 steps with a railing? : Total 6 Click Score: 16    End of Session Equipment Utilized During Treatment: Gait belt Activity Tolerance: Patient tolerated treatment well Patient left: in chair;with call bell/phone within reach;with chair alarm set Nurse Communication: Mobility status PT Visit Diagnosis: Other abnormalities of gait and mobility (R26.89);Muscle weakness (generalized) (M62.81);Difficulty in walking, not elsewhere classified (R26.2)     Time: 8329-1916 PT Time Calculation (min) (ACUTE ONLY): 18 min  Charges:  $Gait Training: 8-22 mins                     Bayard Males, PT Acute Rehabilitation Services Office: [place] 07/06/2022, 11:30 AM

## 2022-07-07 NOTE — Progress Notes (Signed)
[facility] ID: #[id number] Next review date: 07/11/2022  Madilyn Fireman, MSW, LCSW Transitions of Care  Clinical Social Worker II [phone]

## 2022-07-07 NOTE — Care Management Important Message (Signed)
Important Message  Patient Details  Name: Jordan [name] MRN: [id number] Date of Birth: 26-Apr-1923   Medicare Important Message Given:  Yes  Patient discharge prior to IM delivery will mail ot the patient home address.   [name] 07/07/2022, 8:21 AM

## 2022-07-08 ENCOUNTER — Inpatient Hospital Stay (HOSPITAL_COMMUNITY)
Admission: EM | Admit: 2022-07-08 | Discharge: 2022-07-18 | DRG: 641 | Disposition: A | Discharge status: Home Health | Payer: Medicare Other | Source: Skilled Nursing Facility | Attending: Internal Medicine | Admitting: Internal Medicine

## 2022-07-08 ENCOUNTER — Emergency Department (HOSPITAL_COMMUNITY): Payer: Medicare Other

## 2022-07-08 ENCOUNTER — Encounter (HOSPITAL_COMMUNITY): Payer: Self-pay

## 2022-07-08 ENCOUNTER — Other Ambulatory Visit: Payer: Self-pay

## 2022-07-08 DIAGNOSIS — D72829 Elevated white blood cell count, unspecified: Secondary | ICD-10-CM | POA: Diagnosis present

## 2022-07-08 DIAGNOSIS — E860 Dehydration: Secondary | ICD-10-CM | POA: Diagnosis not present

## 2022-07-08 DIAGNOSIS — S0083XA Contusion of other part of head, initial encounter: Secondary | ICD-10-CM | POA: Diagnosis present

## 2022-07-08 DIAGNOSIS — F03918 Unspecified dementia, unspecified severity, with other behavioral disturbance: Secondary | ICD-10-CM | POA: Diagnosis present

## 2022-07-08 DIAGNOSIS — I110 Hypertensive heart disease with heart failure: Secondary | ICD-10-CM | POA: Diagnosis present

## 2022-07-08 DIAGNOSIS — M1990 Unspecified osteoarthritis, unspecified site: Secondary | ICD-10-CM | POA: Diagnosis not present

## 2022-07-08 DIAGNOSIS — R069 Unspecified abnormalities of breathing: Secondary | ICD-10-CM | POA: Diagnosis not present

## 2022-07-08 DIAGNOSIS — Z955 Presence of coronary angioplasty implant and graft: Secondary | ICD-10-CM | POA: Diagnosis not present

## 2022-07-08 DIAGNOSIS — I10 Essential (primary) hypertension: Secondary | ICD-10-CM | POA: Diagnosis present

## 2022-07-08 DIAGNOSIS — K52832 Lymphocytic colitis: Secondary | ICD-10-CM | POA: Diagnosis present

## 2022-07-08 DIAGNOSIS — M160 Bilateral primary osteoarthritis of hip: Secondary | ICD-10-CM | POA: Diagnosis not present

## 2022-07-08 DIAGNOSIS — Z781 Physical restraint status: Secondary | ICD-10-CM | POA: Diagnosis not present

## 2022-07-08 DIAGNOSIS — E118 Type 2 diabetes mellitus with unspecified complications: Secondary | ICD-10-CM | POA: Diagnosis present

## 2022-07-08 DIAGNOSIS — Z7982 Long term (current) use of aspirin: Secondary | ICD-10-CM

## 2022-07-08 DIAGNOSIS — R404 Transient alteration of awareness: Secondary | ICD-10-CM | POA: Diagnosis not present

## 2022-07-08 DIAGNOSIS — R918 Other nonspecific abnormal finding of lung field: Secondary | ICD-10-CM | POA: Diagnosis not present

## 2022-07-08 DIAGNOSIS — Z79899 Other long term (current) drug therapy: Secondary | ICD-10-CM

## 2022-07-08 DIAGNOSIS — I350 Nonrheumatic aortic (valve) stenosis: Secondary | ICD-10-CM | POA: Diagnosis not present

## 2022-07-08 DIAGNOSIS — M47812 Spondylosis without myelopathy or radiculopathy, cervical region: Secondary | ICD-10-CM | POA: Diagnosis not present

## 2022-07-08 DIAGNOSIS — W19XXXA Unspecified fall, initial encounter: Secondary | ICD-10-CM | POA: Diagnosis not present

## 2022-07-08 DIAGNOSIS — Z8249 Family history of ischemic heart disease and other diseases of the circulatory system: Secondary | ICD-10-CM

## 2022-07-08 DIAGNOSIS — E876 Hypokalemia: Secondary | ICD-10-CM | POA: Diagnosis not present

## 2022-07-08 DIAGNOSIS — Z043 Encounter for examination and observation following other accident: Secondary | ICD-10-CM | POA: Diagnosis not present

## 2022-07-08 DIAGNOSIS — Z7984 Long term (current) use of oral hypoglycemic drugs: Secondary | ICD-10-CM

## 2022-07-08 DIAGNOSIS — Z66 Do not resuscitate: Secondary | ICD-10-CM | POA: Diagnosis not present

## 2022-07-08 DIAGNOSIS — Z7689 Persons encountering health services in other specified circumstances: Secondary | ICD-10-CM

## 2022-07-08 DIAGNOSIS — N400 Enlarged prostate without lower urinary tract symptoms: Secondary | ICD-10-CM | POA: Diagnosis not present

## 2022-07-08 DIAGNOSIS — M47817 Spondylosis without myelopathy or radiculopathy, lumbosacral region: Secondary | ICD-10-CM | POA: Diagnosis not present

## 2022-07-08 DIAGNOSIS — G9341 Metabolic encephalopathy: Secondary | ICD-10-CM | POA: Diagnosis present

## 2022-07-08 DIAGNOSIS — E785 Hyperlipidemia, unspecified: Secondary | ICD-10-CM | POA: Diagnosis not present

## 2022-07-08 DIAGNOSIS — Y92129 Unspecified place in nursing home as the place of occurrence of the external cause: Secondary | ICD-10-CM

## 2022-07-08 DIAGNOSIS — I2510 Atherosclerotic heart disease of native coronary artery without angina pectoris: Secondary | ICD-10-CM | POA: Diagnosis present

## 2022-07-08 DIAGNOSIS — I5032 Chronic diastolic (congestive) heart failure: Secondary | ICD-10-CM | POA: Diagnosis present

## 2022-07-08 DIAGNOSIS — G4489 Other headache syndrome: Secondary | ICD-10-CM | POA: Diagnosis not present

## 2022-07-08 DIAGNOSIS — S0990XA Unspecified injury of head, initial encounter: Secondary | ICD-10-CM | POA: Diagnosis not present

## 2022-07-08 LAB — CBC WITH DIFFERENTIAL/PLATELET
Abs Immature Granulocytes: 0.06 10*3/uL (ref 0.00–0.07)
Basophils Absolute: 0 10*3/uL (ref 0.0–0.1)
Basophils Relative: 0 %
Eosinophils Absolute: 0 10*3/uL (ref 0.0–0.5)
Eosinophils Relative: 0 %
HCT: 35.9 % — ABNORMAL LOW (ref 39.0–52.0)
Hemoglobin: 11.4 g/dL — ABNORMAL LOW (ref 13.0–17.0)
Immature Granulocytes: 1 %
Lymphocytes Relative: 9 %
Lymphs Abs: 1.1 10*3/uL (ref 0.7–4.0)
MCH: 29.5 pg (ref 26.0–34.0)
MCHC: 31.8 g/dL (ref 30.0–36.0)
MCV: 92.8 fL (ref 80.0–100.0)
Monocytes Absolute: 0.7 10*3/uL (ref 0.1–1.0)
Monocytes Relative: 5 %
Neutro Abs: 11 10*3/uL — ABNORMAL HIGH (ref 1.7–7.7)
Neutrophils Relative %: 85 %
Platelets: 354 10*3/uL (ref 150–400)
RBC: 3.87 MIL/uL — ABNORMAL LOW (ref 4.22–5.81)
RDW: 13.5 % (ref 11.5–15.5)
WBC: 12.9 10*3/uL — ABNORMAL HIGH (ref 4.0–10.5)
nRBC: 0 % (ref 0.0–0.2)

## 2022-07-08 LAB — BASIC METABOLIC PANEL
Anion gap: 4 — ABNORMAL LOW (ref 5–15)
BUN: 33 mg/dL — ABNORMAL HIGH (ref 8–23)
CO2: 18 mmol/L — ABNORMAL LOW (ref 22–32)
Calcium: 5.9 mg/dL — CL (ref 8.9–10.3)
Chloride: 119 mmol/L — ABNORMAL HIGH (ref 98–111)
Creatinine, Ser: 0.84 mg/dL (ref 0.61–1.24)
GFR, Estimated: >60 mL/min (ref >60)
Glucose, Bld: 112 mg/dL — ABNORMAL HIGH (ref 70–99)
Potassium: 2.6 mmol/L — CL (ref 3.5–5.1)
Sodium: 141 mmol/L (ref 135–145)

## 2022-07-08 MED ORDER — ASPIRIN 81 MG PO CHEW
81.0000 mg | CHEWABLE_TABLET | Freq: Every day | ORAL | Status: DC
  Administered 2022-07-09 – 2022-07-18 (×9): 81 mg via ORAL
  Filled 2022-07-08 (×10): qty 1

## 2022-07-08 MED ORDER — FINASTERIDE 5 MG PO TABS
5.0000 mg | ORAL_TABLET | Freq: Every day | ORAL | Status: DC
  Administered 2022-07-09 – 2022-07-18 (×9): 5 mg via ORAL
  Filled 2022-07-08 (×10): qty 1

## 2022-07-08 MED ORDER — BUDESONIDE 3 MG PO CPEP: 3.0000 mg | ORAL_CAPSULE | Freq: Every day | ORAL | Status: DC

## 2022-07-08 MED ORDER — ENSURE ENLIVE PO LIQD
237.0000 mL | Freq: Three times a day (TID) | ORAL | Status: DC
  Administered 2022-07-12 – 2022-07-18 (×15): 237 mL via ORAL
  Filled 2022-07-08: qty 237

## 2022-07-08 MED ORDER — LORAZEPAM 2 MG/ML IJ SOLN
0.5000 mg | INTRAMUSCULAR | Status: DC | PRN
  Administered 2022-07-09 – 2022-07-18 (×4): 0.5 mg via INTRAVENOUS
  Filled 2022-07-08 (×4): qty 1

## 2022-07-08 MED ORDER — MIRABEGRON ER 25 MG PO TB24
25.0000 mg | ORAL_TABLET | Freq: Every day | ORAL | Status: DC
  Administered 2022-07-09 – 2022-07-18 (×9): 25 mg via ORAL
  Filled 2022-07-08 (×10): qty 1

## 2022-07-08 MED ORDER — SODIUM CHLORIDE 0.9% FLUSH
3.0000 mL | Freq: Two times a day (BID) | INTRAVENOUS | Status: DC
  Administered 2022-07-09 – 2022-07-17 (×15): 3 mL via INTRAVENOUS

## 2022-07-08 MED ORDER — ENOXAPARIN SODIUM 40 MG/0.4ML IJ SOSY
40.0000 mg | PREFILLED_SYRINGE | INTRAMUSCULAR | Status: DC
  Administered 2022-07-09: 40 mg via SUBCUTANEOUS
  Filled 2022-07-08: qty 0.4

## 2022-07-08 MED ORDER — QUETIAPINE FUMARATE 25 MG PO TABS
75.0000 mg | ORAL_TABLET | Freq: Every day | ORAL | Status: DC
  Administered 2022-07-09 – 2022-07-18 (×10): 75 mg via ORAL
  Filled 2022-07-08 (×11): qty 3

## 2022-07-08 MED ORDER — SODIUM CHLORIDE 0.9% FLUSH: 3.0000 mL | INTRAVENOUS | Status: DC | PRN

## 2022-07-08 MED ORDER — INSULIN ASPART 100 UNIT/ML IJ SOLN
0.0000 [IU] | Freq: Three times a day (TID) | INTRAMUSCULAR | Status: DC
  Administered 2022-07-10: 2 [IU] via SUBCUTANEOUS
  Administered 2022-07-11: 3 [IU] via SUBCUTANEOUS
  Administered 2022-07-13: 2 [IU] via SUBCUTANEOUS
  Administered 2022-07-13 – 2022-07-14 (×2): 3 [IU] via SUBCUTANEOUS
  Administered 2022-07-14: 2 [IU] via SUBCUTANEOUS
  Administered 2022-07-16: 5 [IU] via SUBCUTANEOUS
  Administered 2022-07-17 (×2): 2 [IU] via SUBCUTANEOUS
  Administered 2022-07-18: 3 [IU] via SUBCUTANEOUS
  Filled 2022-07-08: qty 0.15

## 2022-07-08 MED ORDER — POTASSIUM CHLORIDE CRYS ER 20 MEQ PO TBCR
40.0000 meq | EXTENDED_RELEASE_TABLET | Freq: Once | ORAL | Status: AC
  Administered 2022-07-08: 40 meq via ORAL
  Filled 2022-07-08: qty 2

## 2022-07-08 MED ORDER — POTASSIUM CHLORIDE 10 MEQ/100ML IV SOLN
10.0000 meq | INTRAVENOUS | Status: AC
  Administered 2022-07-08: 10 meq via INTRAVENOUS
  Filled 2022-07-08 (×2): qty 100

## 2022-07-08 MED ORDER — CALCIUM GLUCONATE-NACL 1-0.675 GM/50ML-% IV SOLN
1.0000 g | Freq: Once | INTRAVENOUS | Status: AC
  Administered 2022-07-09: 1000 mg via INTRAVENOUS
  Filled 2022-07-08: qty 50

## 2022-07-08 MED ORDER — ACETAMINOPHEN 325 MG PO TABS
650.0000 mg | ORAL_TABLET | Freq: Four times a day (QID) | ORAL | Status: DC | PRN
  Administered 2022-07-13: 650 mg via ORAL
  Filled 2022-07-08: qty 2

## 2022-07-08 MED ORDER — METOPROLOL TARTRATE 12.5 MG HALF TABLET
12.5000 mg | ORAL_TABLET | Freq: Two times a day (BID) | ORAL | Status: DC
  Administered 2022-07-08 – 2022-07-18 (×20): 12.5 mg via ORAL
  Filled 2022-07-08 (×21): qty 1

## 2022-07-08 MED ORDER — PRAVASTATIN SODIUM 20 MG PO TABS: 80.0000 mg | ORAL_TABLET | Freq: Every day | ORAL | Status: DC

## 2022-07-08 MED ORDER — MIRABEGRON ER 25 MG PO TB24: 25.0000 mg | ORAL_TABLET | Freq: Every day | ORAL | Status: DC

## 2022-07-08 MED ORDER — METFORMIN HCL 500 MG PO TABS: 500.0000 mg | ORAL_TABLET | Freq: Every day | ORAL | Status: DC

## 2022-07-08 MED ORDER — ACETAMINOPHEN 650 MG RE SUPP: 650.0000 mg | Freq: Four times a day (QID) | RECTAL | Status: DC | PRN

## 2022-07-08 MED ORDER — HALOPERIDOL LACTATE 5 MG/ML IJ SOLN
1.0000 mg | Freq: Four times a day (QID) | INTRAMUSCULAR | Status: DC | PRN
  Administered 2022-07-09 – 2022-07-14 (×3): 1 mg via INTRAVENOUS
  Filled 2022-07-08 (×3): qty 1

## 2022-07-08 MED ORDER — MEMANTINE HCL 10 MG PO TABS
10.0000 mg | ORAL_TABLET | Freq: Two times a day (BID) | ORAL | Status: DC
  Administered 2022-07-09 – 2022-07-18 (×19): 10 mg via ORAL
  Filled 2022-07-08 (×21): qty 1

## 2022-07-08 MED ORDER — SODIUM CHLORIDE 0.9 % IV SOLN: 250.0000 mL | INTRAVENOUS | Status: DC | PRN

## 2022-07-08 MED ORDER — METFORMIN HCL 500 MG PO TABS
500.0000 mg | ORAL_TABLET | Freq: Every day | ORAL | Status: DC
  Administered 2022-07-09 – 2022-07-18 (×10): 500 mg via ORAL
  Filled 2022-07-08 (×10): qty 1

## 2022-07-08 MED ORDER — FINASTERIDE 5 MG PO TABS: 5.0000 mg | ORAL_TABLET | Freq: Every day | ORAL | Status: DC

## 2022-07-08 MED ORDER — SENNA 8.6 MG PO TABS
1.0000 | ORAL_TABLET | Freq: Two times a day (BID) | ORAL | Status: DC
  Administered 2022-07-09 – 2022-07-18 (×19): 8.6 mg via ORAL
  Filled 2022-07-08 (×20): qty 1

## 2022-07-08 MED ORDER — BUDESONIDE 3 MG PO CPEP
3.0000 mg | ORAL_CAPSULE | Freq: Every day | ORAL | Status: DC
  Administered 2022-07-09 – 2022-07-18 (×9): 3 mg via ORAL
  Filled 2022-07-08 (×11): qty 1

## 2022-07-08 MED ORDER — PRAVASTATIN SODIUM 40 MG PO TABS
80.0000 mg | ORAL_TABLET | Freq: Every day | ORAL | Status: DC
  Administered 2022-07-09 – 2022-07-18 (×9): 80 mg via ORAL
  Filled 2022-07-08 (×10): qty 2

## 2022-07-08 NOTE — ED Triage Notes (Signed)
Patient BIB [facility] from [facility]. Unwitnessed fall around 2 hours ago. Hematoma to the right forehead. Patient takes aspirin. Dementia.

## 2022-07-08 NOTE — Assessment & Plan Note (Signed)
Patient may have had a TIA 06/20/22 with slurred speech and facial droop per daughter. W/U 12/6-12/21/23 negative for CVA but he was noted to have ICA stenosis at 75% and small vessel disease. EEG x 2 revealed non-specific slowing c/w metabolic encephalopathy. He has had mild to moderate memory loss and mild episodes of disorientation that became much worse 06/20/22. He was previously started on Namenda and given a trial of Aricept by PCP. During recent in-patient stay required medical management of agitation.  Plan Continue Namenda  Continue seroquel  Lorazepam IV for agitation  Haloperidol IV for agitation not controlled by ativan

## 2022-07-08 NOTE — Assessment & Plan Note (Signed)
Prior diagnosis. Has been followed by GI in [place].  Plan Continue home medication

## 2022-07-08 NOTE — Assessment & Plan Note (Signed)
BP is stable.  Plan Continue home meds  Rechallenge with ACE-I

## 2022-07-08 NOTE — Assessment & Plan Note (Signed)
No clear cause. Was hyperkalemic at last admission.  Plan K replacement IV  F/u Bmet

## 2022-07-08 NOTE — Subjective & Objective (Signed)
88 yo male with history of arthritis, BPH, CAD, HTN, hyperlipidemia, diabetes and severe aortic stenosis, CAD w/ DES-RCA 2009. Recent admission 12/5-12/21/23 for acute encephalopathy and behavioral disturbance. During that admission CT head-negative, MRI brain w/o acute findings, CTA head/neck with 75% rich ICA, intracranial small vessel stenosis, EEG c/w moderate encephalopathy, overnight EEG with moderate encephalopathy. Patient did have hyperkalemia which at discharge was resolve with K = 4.3 on 07/06/22. Blood Cx's x 2 negative at 5 days.   During his hospitalization he did have agitation and was started on seroquel. He was continued on Namenda. He was discharged to SNF-[facility]. On the day of admission he had an unwitnessed fall. He had a persistent state of confusion. He was transported to [facility] for further evaluation.

## 2022-07-08 NOTE — Assessment & Plan Note (Signed)
Patient by recent ECHO with hyperactive systolic function with EF 60-65%, moderate to severe AoV stenosis, LVH, Grade I diastolic dysfunction. By report he has had DOE, decreased overall energy. He was evaluated for TAVR - not a candidate.  Plan No indication for telemetry  Continue B-blocker  Rechallenge with low dose ACE-I - was stopped 2/2 low BP by PCP

## 2022-07-08 NOTE — H&P (Signed)
History and Physical    Jordan [name] VOH:[id number] DOB: [birth year] DOA: 07/08/2022  DOS: the patient was seen and examined on 07/08/2022  PCP: Asencion Noble, MD   Patient coming from: SNF  I have personally briefly reviewed patient's old medical records in [place]  88 yo male with history of arthritis, BPH, CAD, HTN, hyperlipidemia, diabetes and severe aortic stenosis, CAD w/ DES-RCA 2009. Recent admission 12/5-12/21/23 for acute encephalopathy and behavioral disturbance. During that admission CT head-negative, MRI brain w/o acute findings, CTA head/neck with 75% rich ICA, intracranial small vessel stenosis, EEG c/w moderate encephalopathy, overnight EEG with moderate encephalopathy. Patient did have hyperkalemia which at discharge was resolve with K = 4.3 on 07/06/22. Blood Cx's x 2 negative at 5 days.   During his hospitalization he did have agitation and was started on seroquel. He was continued on Namenda. He was discharged to [facility]. On the day of admission he had an unwitnessed fall. He had a persistent state of confusion. He was transported to [facility] for further evaluation.    ED Course: Afebrile, 121/66  83  22  Per EDP exam - hematoma right forehead otherwise unremarkable. Patient is confused. Lab Cr 0.84, K 2.6, Ca 5.5 WBC 12.9 w/ 85/9/5, CT head- negative, CXR - NAD, R-ray pelvis - no fracture. TRH called to admit for management of hypokalemia and continued confusion  Review of Systems:  Review of Systems  Constitutional: Negative.   HENT: Negative.    Eyes:        Poor vision per daughter's report - macular degeneration  Respiratory: Negative.    Cardiovascular: Negative.   Gastrointestinal: Negative.   Genitourinary:  Positive for frequency.  Musculoskeletal:  Positive for falls.  Skin: Negative.   Neurological: Negative.   Endo/Heme/Allergies: Negative.   Psychiatric/Behavioral:  Positive for hallucinations and memory loss.     Past Medical  History:  Diagnosis Date   Aortic stenosis    Arthritis    BPH (benign prostatic hypertrophy)    CAD (coronary artery disease)    DES to RCA 2009   Diverticulosis of colon    Essential hypertension    Fecal urgency    History of adenomatous polyp of colon    Tubular adenomas 2016   Hyperlipidemia    Lower urinary tract symptoms (LUTS)    Type 2 diabetes mellitus ([place])    Wears glasses     Past Surgical History:  Procedure Laterality Date   BIOPSY  10/02/2019   Procedure: BIOPSY;  Surgeon: Rogene Houston, MD;  Location: [facility];  Service: Endoscopy;;   CATARACT EXTRACTION W/ INTRAOCULAR LENS  IMPLANT, BILATERAL     COLONOSCOPY  last one 11/16/2014   polpectomy   CORONARY ANGIOPLASTY WITH STENT PLACEMENT  12-23-2007  dr hochrein   (abnormal myoview)  PCI w/ DES x1 pRCA (95%),  pCFX 25%,  mCFX 40-50%,  after PDA 30%,  prox, mid, and ostial LAD  30% calcification,  LM diffuse 25% calcification,  ef 60%   EVALUATION UNDER ANESTHESIA WITH ANAL FISTULECTOMY [name] 06/03/2015   Procedure: ANAL EXAM UNDER ANESTHESIA ,FISTULOTOMY ;  Surgeon: [name], MD;  Location: [place];  Service: General;  Laterality: [name];   FLEXIBLE SIGMOIDOSCOPY [name] 10/02/2019   Procedure: FLEXIBLE SIGMOIDOSCOPY;  Surgeon: Rogene Houston, MD;  [facility];  Service: Endoscopy;  Laterality: [name];  230   INGUINAL HERNIA REPAIR Right 2001   TRANSTHORACIC ECHOCARDIOGRAM  12-06-2007   normal LVF,  ef 60%/  mild to moderate  AV calcification without stenosis,  mild AR and TR,  mild LAE    Soc Hx -  widowed. He has two sons, one daughter who is the primary caretaker/POA. 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 Has been very active up until the last year or so: hunts, fishes, golfs, regular gym rat, investor. Lives in his own home alone but his daughter is there every day, does the cooking and housework and assists him as needed.    reports that he  quit smoking about 39 years ago. His smoking use included cigarettes. He has never used smokeless tobacco. He reports that he does not drink alcohol and does not use drugs.  No Known Allergies  Family History  Problem Relation Age of Onset   Heart disease Brother    Colon cancer Neg Hx     Prior to Admission medications   Medication Sig Start Date End Date Taking? Authorizing Provider  aspirin 81 MG tablet Take 1 tablet (81 mg total) by mouth daily with breakfast. 06/20/22   Emokpae, Courage, MD  budesonide (ENTOCORT EC) 3 MG 24 hr capsule TAKE 1 CAPSULE BY MOUTH DAILY. 06/12/22   [name], MD  feeding supplement (ENSURE ENLIVE / ENSURE PLUS) LIQD Take 237 mLs by mouth 3 (three) times daily after meals. 07/06/22   [name], [name], MD  finasteride (PROSCAR) 5 MG tablet Take 1 tablet (5 mg total) by mouth daily. as directed 05/04/21   [name], [name], MD  memantine (NAMENDA) 10 MG tablet Take 10 mg by mouth 2 (two) times daily. 04/03/22   [provider]  metFORMIN (GLUCOPHAGE) 500 MG tablet Take 500 mg by mouth at bedtime.     [provider]  metoprolol tartrate (LOPRESSOR) 25 MG tablet Take 0.5 tablets (12.5 mg total) by mouth 2 (two) times daily. 07/06/22 08/05/22  [name], [name], MD  mirabegron ER (MYRBETRIQ) 25 MG TB24 tablet Take 1 tablet (25 mg total) by mouth daily. 03/17/22   [name], [name], MD  Multiple Vitamins-Minerals (PRESERVISION AREDS PO) Take 1 tablet by mouth daily.    [provider]  pravastatin (PRAVACHOL) 80 MG tablet Take 80 mg by mouth daily.    [provider]  QUEtiapine (SEROQUEL) 25 MG tablet Take 3 tablets (75 mg total) by mouth at bedtime. 07/06/22 08/05/22  [name], MD  silodosin (RAPAFLO) 8 MG CAPS capsule Take 1 capsule (8 mg total) by mouth in the morning and at bedtime. 03/17/22   Cleon Gustin, MD    Physical Exam: Vitals:   07/08/22 1930 07/08/22 2000  07/08/22 2030 07/08/22 2314  BP: (!) 150/64 (!) 150/86 121/66   Pulse: 78 87 83   Resp: 18 (!) 21 (!) 22   Temp:    98.3 F (36.8 C)  TempSrc:      SpO2: 97% 100% 100%     Physical Exam Vitals and nursing note reviewed.  Constitutional:      Comments: Awake, responsive, gaunt. Very cheery, in no distress.  HENT:     Head: Normocephalic.     Comments: 2 cm hematoma right forehead    Nose: Nose normal.     Mouth/Throat:     Mouth: Mucous membranes are moist.     Comments: Native dentition Eyes:     Extraocular Movements: Extraocular movements intact.     Conjunctiva/sclera: Conjunctivae normal.     Pupils: Pupils  are equal, round, and reactive to light.  Neck:     Vascular: Carotid bruit present.     Comments: Bruit left - may be radiation of aortic stenosis Cardiovascular:     Rate and Rhythm: Normal rate and regular rhythm.     Pulses: Normal pulses.     Heart sounds: Murmur heard.     Comments: II/VI systolic mm best at RSB, also at [place]. Pulmonary:     Effort: Pulmonary effort is normal.     Breath sounds: Normal breath sounds.  Abdominal:     General: Abdomen is flat. Bowel sounds are normal.     Palpations: Abdomen is soft.  Musculoskeletal:        General: No deformity. Normal range of motion.     Cervical back: Normal range of motion and neck supple. No rigidity.     Right lower leg: No edema.     Left lower leg: No edema.  Lymphadenopathy:     Cervical: No cervical adenopathy.  Skin:    General: Skin is warm and dry.     Findings: Bruising present.     Comments: Minor bruising, capillary fragility changes  Neurological:     General: No focal deficit present.     Mental Status: He is disoriented.     Cranial Nerves: No cranial nerve deficit.     Deep Tendon Reflexes: Reflexes normal.  Psychiatric:     Comments: Pleasant, disoriented, per daughter has visual hallucinations of deceased people. Not agitated.       Labs on Admission: I have personally  reviewed following labs and imaging studies  CBC: Recent Labs  Lab 07/05/22 0302 07/08/22 1819  WBC 4.7 12.9*  NEUTROABS  --  11.0*  HGB 11.0* 11.4*  HCT 32.5* 35.9*  MCV 88.3 92.8  PLT 318 831   Basic Metabolic Panel: Recent Labs  Lab 07/05/22 0302 07/08/22 1937  NA 138 141  K 4.3 2.6*  CL 103 119*  CO2 25 18*  GLUCOSE 120* 112*  BUN 36* 33*  CREATININE 1.25* 0.84  CALCIUM 8.6* 5.9*  MG 2.2  --   PHOS 4.1  --    GFR: Estimated Creatinine Clearance: 39 mL/min (by C-G formula based on SCr of 0.84 mg/dL). Liver Function Tests: No results for input(s): "AST", "ALT", "ALKPHOS", "BILITOT", "PROT", "ALBUMIN" in the last 168 hours. No results for input(s): "LIPASE", "AMYLASE" in the last 168 hours. No results for input(s): "AMMONIA" in the last 168 hours. Coagulation Profile: No results for input(s): "INR", "PROTIME" in the last 168 hours. Cardiac Enzymes: No results for input(s): "CKTOTAL", "CKMB", "CKMBINDEX", "TROPONINI" in the last 168 hours. BNP (last 3 results) No results for input(s): "PROBNP" in the last 8760 hours. HbA1C: No results for input(s): "HGBA1C" in the last 72 hours. CBG: Recent Labs  Lab 07/05/22 1645 07/05/22 2200 07/06/22 0750 07/06/22 1133 07/06/22 1650  GLUCAP 154* 160* 116* 302* 118*   Lipid Profile: No results for input(s): "CHOL", "HDL", "LDLCALC", "TRIG", "CHOLHDL", "LDLDIRECT" in the last 72 hours. Thyroid Function Tests: No results for input(s): "TSH", "T4TOTAL", "FREET4", "T3FREE", "THYROIDAB" in the last 72 hours. Anemia Panel: No results for input(s): "VITAMINB12", "FOLATE", "FERRITIN", "TIBC", "IRON", "RETICCTPCT" in the last 72 hours. Urine analysis:    Component Value Date/Time   COLORURINE STRAW (A) 06/22/2022 0509   APPEARANCEUR CLEAR 06/22/2022 0509   APPEARANCEUR Clear 03/17/2022 1240   LABSPEC 1.008 06/22/2022 0509   PHURINE 7.0 06/22/2022 0509   GLUCOSEU NEGATIVE 06/22/2022 0509  [place] NEGATIVE 06/22/2022 [place] NEGATIVE 06/22/2022 0509   BILIRUBINUR negative 04/13/2022 1525   BILIRUBINUR Negative 03/17/2022 [place] 06/22/2022 0509   PROTEINUR NEGATIVE 06/22/2022 0509   UROBILINOGEN 1.0 04/13/2022 1525   NITRITE NEGATIVE 06/22/2022 0509   LEUKOCYTESUR NEGATIVE 06/22/2022 0509    Radiological Exams on Admission: I have personally reviewed images DG Chest Portable 1 View  Result Date: 07/08/2022 CLINICAL DATA:  [place] 2 hours ago EXAM: PORTABLE CHEST 1 VIEW COMPARISON:  06/20/2022, 06/25/2022 FINDINGS: Single frontal view of the chest demonstrates an unremarkable cardiac silhouette. Stable atherosclerosis. No airspace disease, effusion, or pneumothorax. Persistent peripheral ground-glass nodularity left upper lobe overlying the lateral aspect second rib. Please see recent CT chest discussion. No acute bony abnormality. IMPRESSION: 1. No acute intrathoracic process. 2. Stable peripheral ground-glass nodularity within the left upper lobe, please refer to recent chest CT 06/25/2022 discussing likely underlying indolent adenocarcinoma. Electronically Signed   By: Randa Ngo M.D.   On: 07/08/2022 18:58   DG Pelvis Portable  Result Date: 07/08/2022 CLINICAL DATA:  [place] 2 hours ago EXAM: PORTABLE PELVIS 1-2 VIEWS COMPARISON:  None Available. FINDINGS: Single frontal view of the pelvis includes both hips. No acute displaced fracture. Symmetrical bilateral hip osteoarthritis. Prominent degenerative changes of the lumbosacral junction. Diffuse atherosclerosis. IMPRESSION: 1. No acute displaced fracture. Electronically Signed   By: Randa Ngo M.D.   On: 07/08/2022 18:56   CT HEAD WO CONTRAST (5MM)  Result Date: 07/08/2022 CLINICAL DATA:  Fall.  Head injury. EXAM: CT HEAD WITHOUT CONTRAST CT CERVICAL SPINE WITHOUT CONTRAST TECHNIQUE: Multidetector CT imaging of the head and cervical spine was performed following the standard protocol without intravenous contrast. Multiplanar CT  image reconstructions of the cervical spine were also generated. RADIATION DOSE REDUCTION: This exam was performed according to the departmental dose-optimization program which includes automated exposure control, adjustment of the mA and/or kV according to patient size and/or use of iterative reconstruction technique. COMPARISON:  06/22/2022. FINDINGS: CT HEAD FINDINGS Brain: No evidence of acute infarction, hemorrhage, hydrocephalus, extra-axial collection or mass lesion/mass effect. Vascular: No hyperdense vessel or unexpected calcification. Skull: Normal. Negative for fracture or focal lesion. Sinuses/Orbits: Globes and orbits are unremarkable. Dependent secretions in the left sphenoid sinus. Remaining sinuses are clear. Other: None. CT CERVICAL SPINE FINDINGS Alignment: Mild kyphosis, apex at C5-C6.  No spondylolisthesis. Skull base and vertebrae: No acute fracture. No primary bone lesion or focal pathologic process. Soft tissues and spinal canal: No prevertebral fluid or swelling. No visible canal hematoma. Disc levels: Moderate loss of disc height at C4-C5, C5-C6 and C6-C7. Disc bulging and endplate spurring noted at these levels, greatest at C5-C6 and C6-C7. No convincing disc herniation. Facet degenerative changes, greatest at C2-C3 and C3-C4. Upper chest: No acute findings. Pleuroparenchymal scarring noted at the lung apices. Other: None. IMPRESSION: HEAD CT 1. No acute intracranial abnormalities. CERVICAL CT 1. No fracture or acute finding. Electronically Signed   By: Lajean Manes M.D.   On: 07/08/2022 18:47   CT Cervical Spine Wo Contrast  Result Date: 07/08/2022 CLINICAL DATA:  Fall.  Head injury. EXAM: CT HEAD WITHOUT CONTRAST CT CERVICAL SPINE WITHOUT CONTRAST TECHNIQUE: Multidetector CT imaging of the head and cervical spine was performed following the standard protocol without intravenous contrast. Multiplanar CT image reconstructions of the cervical spine were also generated. RADIATION DOSE  REDUCTION: This exam was performed according to the departmental dose-optimization program which includes automated exposure control, adjustment of the mA  and/or kV according to patient size and/or use of iterative reconstruction technique. COMPARISON:  06/22/2022. FINDINGS: CT HEAD FINDINGS Brain: No evidence of acute infarction, hemorrhage, hydrocephalus, extra-axial collection or mass lesion/mass effect. Vascular: No hyperdense vessel or unexpected calcification. Skull: Normal. Negative for fracture or focal lesion. Sinuses/Orbits: Globes and orbits are unremarkable. Dependent secretions in the left sphenoid sinus. Remaining sinuses are clear. Other: None. CT CERVICAL SPINE FINDINGS Alignment: Mild kyphosis, apex at C5-C6.  No spondylolisthesis. Skull base and vertebrae: No acute fracture. No primary bone lesion or focal pathologic process. Soft tissues and spinal canal: No prevertebral fluid or swelling. No visible canal hematoma. Disc levels: Moderate loss of disc height at C4-C5, C5-C6 and C6-C7. Disc bulging and endplate spurring noted at these levels, greatest at C5-C6 and C6-C7. No convincing disc herniation. Facet degenerative changes, greatest at C2-C3 and C3-C4. Upper chest: No acute findings. Pleuroparenchymal scarring noted at the lung apices. Other: None. IMPRESSION: HEAD CT 1. No acute intracranial abnormalities. CERVICAL CT 1. No fracture or acute finding. Electronically Signed   By: Lajean Manes M.D.   On: 07/08/2022 18:47    EKG: I have personally reviewed EKG: Sinus rhythm, trigeminy w/o acute changes  Assessment/Plan Principal Problem:   Hypokalemia Active Problems:   Dementia with behavioral disturbance (HCC)   Leukocytosis   Type 2 diabetes mellitus with complication (HCC)   Hypertension   Chronic diastolic CHF (congestive heart failure) (HCC)   Lymphocytic colitis    Assessment and Plan: * Hypokalemia No clear cause. Was hyperkalemic at last admission.  Plan K replacement  IV  F/u Bmet  Dementia with behavioral disturbance [facility]) Patient may have had a TIA 06/20/22 with slurred speech and facial droop per daughter. W/U 12/6-12/21/23 negative for CVA but he was noted to have ICA stenosis at 75% and small vessel disease. EEG x 2 revealed non-specific slowing c/w metabolic encephalopathy. He has had mild to moderate memory loss and mild episodes of disorientation that became much worse 06/20/22. He was previously started on Namenda and given a trial of Aricept by PCP. During recent in-patient stay required medical management of agitation.  Plan Continue Namenda  Continue seroquel  Lorazepam IV for agitation  Haloperidol IV for agitation not controlled by ativan  Leukocytosis Patient is afebrile, CXR - NAD. No apparent source of infection as cause of mild leukocytosis to 12.9.  Plan No indication for Abx at this time  U/A - patient at risk with BPH and poor bladder emptying  F/u CBCD  Chronic diastolic CHF (congestive heart failure) ([place]) Patient by recent ECHO with hyperactive systolic function with EF 60-65%, moderate to severe AoV stenosis, LVH, Grade I diastolic dysfunction. By report he has had DOE, decreased overall energy. He was evaluated for TAVR - not a candidate.  Plan No indication for telemetry  Continue B-blocker  Rechallenge with low dose ACE-I - was stopped 2/2 low BP by PCP  Hypertension BP is stable.  Plan Continue home meds  Rechallenge with ACE-I  Type 2 diabetes mellitus with complication (HCC) Last X9B 06/22/22 7.2%. He is on metformin - renal function normal. This is an acceptable level of control for his age and condition  Plan Continue metformin  Sliding scale coverage AC/HS  Lymphocytic colitis Prior diagnosis. Has been followed by GI in [place].  Plan Continue home medication   Code status - patient is DNR/DNI. Confirmed this with daughter/POA  Disposition - daughter prefers in-home care. She does have a list of pvt  duty  sitters.   Plan TOC consult  PT/OT evaluation    DVT prophylaxis: Lovenox Code Status: DNR/DNI(Do NOT Intubate) Family Communication: talked at length with daughter: full review of all his problems and recent workup. She is comfortable with current Tx plan. She confirms code status  Disposition Plan: TBD  Consults called: none  Admission status: Inpatient, Med-Surg   Adella Hare, MD [facility] 07/08/2022, 11:31 PM

## 2022-07-08 NOTE — ED Provider Notes (Addendum)
[facility] DEPT Provider Note   CSN: [id number] Arrival date & time: 07/08/22  1747     History  Chief Complaint  Patient presents with   Jordan [name] Jordan [name] is a 88 y.o. male.  Patient here after fall.  Unwitnessed at skilled nursing facility.  History of memory issues.  Discharged from hospital couple days ago after extensive workup for memory change, weakness.  Workup was unremarkable per family member.  He was supposed to go to rehab to get some physical therapy and get stronger so he can get back home where patient normally lives with his daughter.  Patient has a hematoma to the right side of the forehead.  He is on aspirin but no other blood thinners.  He denies any pain.  He can move all extremities.  Normal vitals with EMS.  Usually uses a walker to get around.  The history is provided by the patient.       Home Medications Prior to Admission medications   Medication Sig Start Date End Date Taking? Authorizing Provider  aspirin 81 MG tablet Take 1 tablet (81 mg total) by mouth daily with breakfast. 06/20/22   Emokpae, Courage, MD  budesonide (ENTOCORT EC) 3 MG 24 hr capsule TAKE 1 CAPSULE BY MOUTH DAILY. 06/12/22   [name], MD  feeding supplement (ENSURE ENLIVE / ENSURE PLUS) LIQD Take 237 mLs by mouth 3 (three) times daily after meals. 07/06/22   [name], [name], MD  finasteride (PROSCAR) 5 MG tablet Take 1 tablet (5 mg total) by mouth daily. as directed 05/04/21   [name], [name], MD  memantine (NAMENDA) 10 MG tablet Take 10 mg by mouth 2 (two) times daily. 04/03/22   [provider]  metFORMIN (GLUCOPHAGE) 500 MG tablet Take 500 mg by mouth at bedtime.     [provider]  metoprolol tartrate (LOPRESSOR) 25 MG tablet Take 0.5 tablets (12.5 mg total) by mouth 2 (two) times daily. 07/06/22 08/05/22  [name], [name], MD  mirabegron ER (MYRBETRIQ) 25 MG TB24 tablet Take 1 tablet  (25 mg total) by mouth daily. 03/17/22   [name], [name], MD  Multiple Vitamins-Minerals (PRESERVISION AREDS PO) Take 1 tablet by mouth daily.    [provider]  pravastatin (PRAVACHOL) 80 MG tablet Take 80 mg by mouth daily.    [provider]  QUEtiapine (SEROQUEL) 25 MG tablet Take 3 tablets (75 mg total) by mouth at bedtime. 07/06/22 08/05/22  [name], MD  silodosin (RAPAFLO) 8 MG CAPS capsule Take 1 capsule (8 mg total) by mouth in the morning and at bedtime. 03/17/22   [name], [name], MD      Allergies    Patient has no known allergies.    Review of Systems   Review of Systems  Physical Exam Updated Vital Signs BP 121/66   Pulse 83   Temp 98.3 F (36.8 C) (Oral)   Resp (!) 22   SpO2 100%  Physical Exam Vitals and nursing note reviewed.  Constitutional:      General: He is not in acute distress.    Appearance: He is well-developed.  HENT:     Head:     Comments: Hematoma to the right side of the forehead Eyes:     Extraocular Movements: Extraocular movements intact.     Conjunctiva/sclera: Conjunctivae normal.     Pupils: Pupils are equal, round, and reactive to light.  Cardiovascular:  Rate and Rhythm: Normal rate and regular rhythm.     Pulses: Normal pulses.     Heart sounds: Normal heart sounds. No murmur heard. Pulmonary:     Effort: Pulmonary effort is normal. No respiratory distress.     Breath sounds: Normal breath sounds.  Abdominal:     Palpations: Abdomen is soft.     Tenderness: There is no abdominal tenderness.  Musculoskeletal:        General: No swelling.     Cervical back: Normal range of motion and neck supple.  Skin:    General: Skin is warm and dry.     Capillary Refill: Capillary refill takes less than 2 seconds.  Neurological:     General: No focal deficit present.     Mental Status: He is alert.     Cranial Nerves: No cranial nerve deficit.     Sensory: No sensory deficit.     Motor: No  weakness.  Psychiatric:        Mood and Affect: Mood normal.     ED Results / Procedures / Treatments   Labs (all labs ordered are listed, but only abnormal results are displayed) Labs Reviewed  CBC WITH DIFFERENTIAL/PLATELET - Abnormal; Notable for the following components:      Result Value   WBC 12.9 (*)    RBC 3.87 (*)    Hemoglobin 11.4 (*)    HCT 35.9 (*)    Neutro Abs 11.0 (*)    All other components within normal limits  BASIC METABOLIC PANEL - Abnormal; Notable for the following components:   Potassium 2.6 (*)    Chloride 119 (*)    CO2 18 (*)    Glucose, Bld 112 (*)    BUN 33 (*)    Calcium 5.9 (*)    Anion gap 4 (*)    All other components within normal limits  MAGNESIUM  CALCIUM, IONIZED    EKG EKG Interpretation  Date/Time:  Saturday July 08 2022 18:07:56 EST Ventricular Rate:  88 PR Interval:  229 QRS Duration: 143 QT Interval:  399 QTC Calculation: 483 R Axis:   -80 Text Interpretation: Sinus rhythm Ventricular trigeminy Confirmed by Lennice Sites ([id number]) on 07/08/2022 6:12:44 PM  Radiology DG Chest Portable 1 View  Result Date: 07/08/2022 CLINICAL DATA:  [place] 2 hours ago EXAM: PORTABLE CHEST 1 VIEW COMPARISON:  06/20/2022, 06/25/2022 FINDINGS: Single frontal view of the chest demonstrates an unremarkable cardiac silhouette. Stable atherosclerosis. No airspace disease, effusion, or pneumothorax. Persistent peripheral ground-glass nodularity left upper lobe overlying the lateral aspect second rib. Please see recent CT chest discussion. No acute bony abnormality. IMPRESSION: 1. No acute intrathoracic process. 2. Stable peripheral ground-glass nodularity within the left upper lobe, please refer to recent chest CT 06/25/2022 discussing likely underlying indolent adenocarcinoma. Electronically Signed   By: Randa Ngo M.D.   On: 07/08/2022 18:58   DG Pelvis Portable  Result Date: 07/08/2022 CLINICAL DATA:  [place] 2 hours ago EXAM: PORTABLE PELVIS 1-2  VIEWS COMPARISON:  None Available. FINDINGS: Single frontal view of the pelvis includes both hips. No acute displaced fracture. Symmetrical bilateral hip osteoarthritis. Prominent degenerative changes of the lumbosacral junction. Diffuse atherosclerosis. IMPRESSION: 1. No acute displaced fracture. Electronically Signed   By: Randa Ngo M.D.   On: 07/08/2022 18:56   CT HEAD WO CONTRAST (5MM)  Result Date: 07/08/2022 CLINICAL DATA:  Fall.  Head injury. EXAM: CT HEAD WITHOUT CONTRAST CT CERVICAL SPINE WITHOUT CONTRAST TECHNIQUE: Multidetector CT imaging of  the head and cervical spine was performed following the standard protocol without intravenous contrast. Multiplanar CT image reconstructions of the cervical spine were also generated. RADIATION DOSE REDUCTION: This exam was performed according to the departmental dose-optimization program which includes automated exposure control, adjustment of the mA and/or kV according to patient size and/or use of iterative reconstruction technique. COMPARISON:  06/22/2022. FINDINGS: CT HEAD FINDINGS Brain: No evidence of acute infarction, hemorrhage, hydrocephalus, extra-axial collection or mass lesion/mass effect. Vascular: No hyperdense vessel or unexpected calcification. Skull: Normal. Negative for fracture or focal lesion. Sinuses/Orbits: Globes and orbits are unremarkable. Dependent secretions in the left sphenoid sinus. Remaining sinuses are clear. Other: None. CT CERVICAL SPINE FINDINGS Alignment: Mild kyphosis, apex at C5-C6.  No spondylolisthesis. Skull base and vertebrae: No acute fracture. No primary bone lesion or focal pathologic process. Soft tissues and spinal canal: No prevertebral fluid or swelling. No visible canal hematoma. Disc levels: Moderate loss of disc height at C4-C5, C5-C6 and C6-C7. Disc bulging and endplate spurring noted at these levels, greatest at C5-C6 and C6-C7. No convincing disc herniation. Facet degenerative changes, greatest at C2-C3  and C3-C4. Upper chest: No acute findings. Pleuroparenchymal scarring noted at the lung apices. Other: None. IMPRESSION: HEAD CT 1. No acute intracranial abnormalities. CERVICAL CT 1. No fracture or acute finding. Electronically Signed   By: Lajean Manes M.D.   On: 07/08/2022 18:47   CT Cervical Spine Wo Contrast  Result Date: 07/08/2022 CLINICAL DATA:  Fall.  Head injury. EXAM: CT HEAD WITHOUT CONTRAST CT CERVICAL SPINE WITHOUT CONTRAST TECHNIQUE: Multidetector CT imaging of the head and cervical spine was performed following the standard protocol without intravenous contrast. Multiplanar CT image reconstructions of the cervical spine were also generated. RADIATION DOSE REDUCTION: This exam was performed according to the departmental dose-optimization program which includes automated exposure control, adjustment of the mA and/or kV according to patient size and/or use of iterative reconstruction technique. COMPARISON:  06/22/2022. FINDINGS: CT HEAD FINDINGS Brain: No evidence of acute infarction, hemorrhage, hydrocephalus, extra-axial collection or mass lesion/mass effect. Vascular: No hyperdense vessel or unexpected calcification. Skull: Normal. Negative for fracture or focal lesion. Sinuses/Orbits: Globes and orbits are unremarkable. Dependent secretions in the left sphenoid sinus. Remaining sinuses are clear. Other: None. CT CERVICAL SPINE FINDINGS Alignment: Mild kyphosis, apex at C5-C6.  No spondylolisthesis. Skull base and vertebrae: No acute fracture. No primary bone lesion or focal pathologic process. Soft tissues and spinal canal: No prevertebral fluid or swelling. No visible canal hematoma. Disc levels: Moderate loss of disc height at C4-C5, C5-C6 and C6-C7. Disc bulging and endplate spurring noted at these levels, greatest at C5-C6 and C6-C7. No convincing disc herniation. Facet degenerative changes, greatest at C2-C3 and C3-C4. Upper chest: No acute findings. Pleuroparenchymal scarring noted at  the lung apices. Other: None. IMPRESSION: HEAD CT 1. No acute intracranial abnormalities. CERVICAL CT 1. No fracture or acute finding. Electronically Signed   By: Lajean Manes M.D.   On: 07/08/2022 18:47    Procedures Procedures    Medications Ordered in ED Medications  aspirin tablet 81 mg (has no administration in time range)  feeding supplement (ENSURE ENLIVE / ENSURE PLUS) liquid 237 mL (has no administration in time range)  metoprolol tartrate (LOPRESSOR) tablet 12.5 mg (has no administration in time range)  QUEtiapine (SEROQUEL) tablet 75 mg (has no administration in time range)  memantine (NAMENDA) tablet 10 mg (has no administration in time range)  finasteride (PROSCAR) tablet 5 mg (has no administration in time  range)  metFORMIN (GLUCOPHAGE) tablet 500 mg (has no administration in time range)  mirabegron ER (MYRBETRIQ) tablet 25 mg (has no administration in time range)  pravastatin (PRAVACHOL) tablet 80 mg (has no administration in time range)  budesonide (ENTOCORT EC) 24 hr capsule 3 mg (has no administration in time range)  calcium gluconate 1 g/ 50 mL sodium chloride IVPB (has no administration in time range)  potassium chloride 10 mEq in 100 mL IVPB (has no administration in time range)  potassium chloride SA (KLOR-CON M) CR tablet 40 mEq (has no administration in time range)    ED Course/ Medical Decision Making/ A&P                           Medical Decision Making Amount and/or Complexity of Data Reviewed Labs: ordered. Radiology: ordered.  Risk OTC drugs. Prescription drug management. Decision regarding hospitalization.   Maddoxx Burkitt Ludwig is here after unwitnessed fall from nursing home.  Normal vitals.  No fever.  History of diabetes, memory issues, CAD.  EKG shows sinus rhythm.  No obvious ischemic changes.  He is well-appearing.  He has no pain.  Some memory issues at baseline it sounds like.  He just had admission for strokelike symptoms with extensive  workup including MRI and infectious workup that was unremarkable.  He has been at skilled nursing facility to try to get stronger to get back home where he lives with his daughter.  He had an unwitnessed fall he has hematoma to the right side of his forehead but he does not have any pain.  Moves all extremities that any issues.  No obvious deformities.  He is not tender on his extremities.  He denies any headache or neck pain.  Talk with the family on the phone.  Will get CT scan of the head and neck and check basic labs.  Differential diagnosis likely mechanical fall but will evaluate for electrolyte abnormality, traumatic processes.  Per my review and interpretation of patient labs there is no significant anemia. CT scan of the head and neck are unremarkable.  Chest x-ray and pelvic x-ray is unremarkable.  He has no complaints on my repeat exam.  Overall daughter is at the bedside.  She is the POA.  She does confirm patient is DNR.  However she does not want patient sent back to [facility] because she is unhappy with the care there.  She is trying to figure out 24/7 care for home but she is not able to support taking him back home tonight.  He has been struggling with memory issues.  He is having some confusion on exam but per my review of his recent hospital admit the thought was that this was a progression of what was likely dementia.  There is question of may be an adenocarcinoma in the lung but they do not want to pursue any oncology workup.  Did not want to pursue any other neurologic workup that was offered to them including LP and other further diagnostic testing.  Awaiting BMP.  BMP and lead to be reset.  On repeat potassium is 2.6, calcium 5.9.  He is having some PVCs.  Will replete both IV and orally potassium and will give a dose of IV calcium.  Does not appear that this would be medication related.  Not sure if this would be reason why he is having some confusion.  Will check a magnesium and  ionized calcium.  Will talk  to hospitalist about maybe observation stay to help correct these as he will also require new social work/case management placement and evaluation.  Will admit to medicine for further repletion of electrolytes given hypokalemia and hypocalcemia.  Patient admitted to Dr. Linda Hedges with medicine.   This chart was dictated using voice recognition software.  Despite best efforts to proofread,  errors can occur which can change the documentation meaning.    Final Clinical Impression(s) / ED Diagnoses Final diagnoses:  Fall, initial encounter  Encounter for social work intervention  Hypokalemia  Hypocalcemia    Rx / DC Orders ED Discharge Orders          Ordered    Hepatic function panel        07/08/22 2048              Lennice Sites, DO 07/08/22 [place], [place], DO 07/08/22 2108    Lennice Sites, DO 07/08/22 2223

## 2022-07-08 NOTE — Assessment & Plan Note (Signed)
Patient is afebrile, CXR - NAD. No apparent source of infection as cause of mild leukocytosis to 12.9.  Plan No indication for Abx at this time  U/A - patient at risk with BPH and poor bladder emptying  F/u CBCD

## 2022-07-08 NOTE — Assessment & Plan Note (Signed)
Last A1C 06/22/22 7.2%. He is on metformin - renal function normal. This is an acceptable level of control for his age and condition  Plan Continue metformin  Sliding scale coverage AC/HS

## 2022-07-09 DIAGNOSIS — E876 Hypokalemia: Secondary | ICD-10-CM | POA: Diagnosis not present

## 2022-07-09 LAB — CBC WITH DIFFERENTIAL/PLATELET
Abs Immature Granulocytes: 0.02 10*3/uL (ref 0.00–0.07)
Basophils Absolute: 0 10*3/uL (ref 0.0–0.1)
Basophils Relative: 1 %
Eosinophils Absolute: 0.2 10*3/uL (ref 0.0–0.5)
Eosinophils Relative: 2 %
HCT: 34.5 % — ABNORMAL LOW (ref 39.0–52.0)
Hemoglobin: 10.7 g/dL — ABNORMAL LOW (ref 13.0–17.0)
Immature Granulocytes: 0 %
Lymphocytes Relative: 16 %
Lymphs Abs: 1.3 10*3/uL (ref 0.7–4.0)
MCH: 29.3 pg (ref 26.0–34.0)
MCHC: 31 g/dL (ref 30.0–36.0)
MCV: 94.5 fL (ref 80.0–100.0)
Monocytes Absolute: 0.8 10*3/uL (ref 0.1–1.0)
Monocytes Relative: 10 %
Neutro Abs: 5.8 10*3/uL (ref 1.7–7.7)
Neutrophils Relative %: 71 %
Platelets: 336 10*3/uL (ref 150–400)
RBC: 3.65 MIL/uL — ABNORMAL LOW (ref 4.22–5.81)
RDW: 13.5 % (ref 11.5–15.5)
WBC: 8.1 10*3/uL (ref 4.0–10.5)
nRBC: 0 % (ref 0.0–0.2)

## 2022-07-09 LAB — BASIC METABOLIC PANEL
Anion gap: 5 (ref 5–15)
BUN: 43 mg/dL — ABNORMAL HIGH (ref 8–23)
CO2: 26 mmol/L (ref 22–32)
Calcium: 8.7 mg/dL — ABNORMAL LOW (ref 8.9–10.3)
Chloride: 109 mmol/L (ref 98–111)
Creatinine, Ser: 1.15 mg/dL (ref 0.61–1.24)
GFR, Estimated: 58 mL/min — ABNORMAL LOW (ref >60)
Glucose, Bld: 133 mg/dL — ABNORMAL HIGH (ref 70–99)
Potassium: 4.5 mmol/L (ref 3.5–5.1)
Sodium: 140 mmol/L (ref 135–145)

## 2022-07-09 LAB — GLUCOSE, CAPILLARY
Glucose-Capillary: 104 mg/dL — ABNORMAL HIGH (ref 70–99)
Glucose-Capillary: 115 mg/dL — ABNORMAL HIGH (ref 70–99)
Glucose-Capillary: 219 mg/dL — ABNORMAL HIGH (ref 70–99)
Glucose-Capillary: 94 mg/dL (ref 70–99)

## 2022-07-09 LAB — URINALYSIS, ROUTINE W REFLEX MICROSCOPIC
Bilirubin Urine: NEGATIVE
Glucose, UA: 50 mg/dL — AB
Hgb urine dipstick: NEGATIVE
Ketones, ur: NEGATIVE mg/dL
Leukocytes,Ua: NEGATIVE
Nitrite: NEGATIVE
Protein, ur: NEGATIVE mg/dL
Specific Gravity, Urine: 1.021 (ref 1.005–1.030)
pH: 5 (ref 5.0–8.0)

## 2022-07-09 LAB — MAGNESIUM: Magnesium: 2.3 mg/dL (ref 1.7–2.4)

## 2022-07-09 MED ORDER — ENOXAPARIN SODIUM 30 MG/0.3ML IJ SOSY
30.0000 mg | PREFILLED_SYRINGE | INTRAMUSCULAR | Status: DC
  Administered 2022-07-10 – 2022-07-17 (×7): 30 mg via SUBCUTANEOUS
  Filled 2022-07-09 (×8): qty 0.3

## 2022-07-09 NOTE — Plan of Care (Signed)

## 2022-07-09 NOTE — Progress Notes (Signed)
[facility] Progress Note  Patient: Jordan [name]     JSE:[id number]  DOA: 07/08/2022   PCP: Asencion Noble, MD       Brief hospital course: This is a 88 year old male with dementia, CAD, diabetes mellitus, severe aortic stenosis and BPH.  The patient was sent to the ER from the skilled nursing facility due to increasing confusion. The patient had a recent admission to the hospital from 12/5 through 12/21 for 4 acute metabolic encephalopathy.  It was felt that he had dementia with behavioral disturbances, medications were changed and he was eventually discharged to a skilled nursing facility.   He returns only 2 days later with recurrent confusion and agitation and a fall. He was noted to have a potassium of 2.6 and a WBC count of 12.9.  Bicarb noted to be 18 and BUN 33 with a creatinine of 0.84. UA was negative for an infection.  Head and neck CTs obtained in the ED did not show any abnormalities. Subjective:  The patient is sedated and unable to awaken enough to communicate  Assessment and Plan: Principal Problem:   Hypokalemia - Adequately replaced and now 4.5  Active Problems:   Dementia with behavioral disturbance ([place]) - He was discharged 2 days ago with Seroquel but apparently was having behavioral disturbances at the skilled nursing facility - Now requiring Haldol and Ativan PRN to control his agitation - He will need a palliative care     Leukocytosis - has improved to normal    Type 2 diabetes mellitus with complication - on SSI with Novolog - cont Metformin    Hypertension/  Chronic diastolic CHF (congestive heart failure)  Moderate to severe Ao stenosis - Metoprolol is being continued     H/o Lymphocytic colitis - cont Entocort     Code Status: DNR Consultants: none Level of Care: Level of care: Med-Surg Total time on patient care: 35 DVT prophylaxis:  enoxaparin (LOVENOX) injection 40 mg Start: 07/09/22 1000     Objective:   Vitals:    07/09/22 0011 07/09/22 0033 07/09/22 0411 07/09/22 0816  BP: (!) 109/53  121/78 (!) 114/51  Pulse: 62  (!) 110 (!) 46  Resp: 18  20 20   Temp: 98 F (36.7 C)  98.2 F (36.8 C) 98.3 F (36.8 C)  TempSrc: Oral Oral Oral Oral  SpO2: 100%  98% 93%  Weight:  55.7 kg    Height:  5\' 9"  (1.753 m)     Filed Weights   07/09/22 0033  Weight: 55.7 kg   Exam: General exam: Appears comfortable -sedated HEENT: oral mucosa moist Respiratory system: Clear to auscultation.  Cardiovascular system: S1 & S2 heard  Gastrointestinal system: Abdomen soft, non-tender, nondistended. Normal bowel sounds   Extremities: No cyanosis, clubbing or edema  CBC: Recent Labs  Lab 07/05/22 0302 07/08/22 1819 07/09/22 0543  WBC 4.7 12.9* 8.1  NEUTROABS  --  11.0* 5.8  HGB 11.0* 11.4* 10.7*  HCT 32.5* 35.9* 34.5*  MCV 88.3 92.8 94.5  PLT 318 354 073   Basic Metabolic Panel: Recent Labs  Lab 07/05/22 0302 07/08/22 1937 07/09/22 0543  NA 138 141  --   K 4.3 2.6*  --   CL 103 119*  --   CO2 25 18*  --   GLUCOSE 120* 112*  --   BUN 36* 33*  --   CREATININE 1.25* 0.84  --   CALCIUM 8.6* 5.9*  --   MG 2.2  --  2.3  PHOS 4.1  --   --    GFR: Estimated Creatinine Clearance: 39.6 mL/min (by C-G formula based on SCr of 0.84 mg/dL).  Scheduled Meds:  aspirin  81 mg Oral Q breakfast   budesonide  3 mg Oral Daily   enoxaparin (LOVENOX) injection  40 mg Subcutaneous Q24H   feeding supplement  237 mL Oral TID PC   finasteride  5 mg Oral Daily   insulin aspart  0-15 Units Subcutaneous TID WC   memantine  10 mg Oral BID   metFORMIN  500 mg Oral QHS   metoprolol tartrate  12.5 mg Oral BID   mirabegron ER  25 mg Oral Daily   pravastatin  80 mg Oral Daily   QUEtiapine  75 mg Oral QHS   senna  1 tablet Oral BID   sodium chloride flush  3 mL Intravenous Q12H   Continuous Infusions:  sodium chloride     Imaging and lab data was personally reviewed DG Chest Portable 1 View  Result Date:  07/08/2022 CLINICAL DATA:  [place] 2 hours ago EXAM: PORTABLE CHEST 1 VIEW COMPARISON:  06/20/2022, 06/25/2022 FINDINGS: Single frontal view of the chest demonstrates an unremarkable cardiac silhouette. Stable atherosclerosis. No airspace disease, effusion, or pneumothorax. Persistent peripheral ground-glass nodularity left upper lobe overlying the lateral aspect second rib. Please see recent CT chest discussion. No acute bony abnormality. IMPRESSION: 1. No acute intrathoracic process. 2. Stable peripheral ground-glass nodularity within the left upper lobe, please refer to recent chest CT 06/25/2022 discussing likely underlying indolent adenocarcinoma. Electronically Signed   By: Randa Ngo M.D.   On: 07/08/2022 18:58   DG Pelvis Portable  Result Date: 07/08/2022 CLINICAL DATA:  [place] 2 hours ago EXAM: PORTABLE PELVIS 1-2 VIEWS COMPARISON:  None Available. FINDINGS: Single frontal view of the pelvis includes both hips. No acute displaced fracture. Symmetrical bilateral hip osteoarthritis. Prominent degenerative changes of the lumbosacral junction. Diffuse atherosclerosis. IMPRESSION: 1. No acute displaced fracture. Electronically Signed   By: Randa Ngo M.D.   On: 07/08/2022 18:56   CT HEAD WO CONTRAST (5MM)  Result Date: 07/08/2022 CLINICAL DATA:  Fall.  Head injury. EXAM: CT HEAD WITHOUT CONTRAST CT CERVICAL SPINE WITHOUT CONTRAST TECHNIQUE: Multidetector CT imaging of the head and cervical spine was performed following the standard protocol without intravenous contrast. Multiplanar CT image reconstructions of the cervical spine were also generated. RADIATION DOSE REDUCTION: This exam was performed according to the departmental dose-optimization program which includes automated exposure control, adjustment of the mA and/or kV according to patient size and/or use of iterative reconstruction technique. COMPARISON:  06/22/2022. FINDINGS: CT HEAD FINDINGS Brain: No evidence of acute infarction,  hemorrhage, hydrocephalus, extra-axial collection or mass lesion/mass effect. Vascular: No hyperdense vessel or unexpected calcification. Skull: Normal. Negative for fracture or focal lesion. Sinuses/Orbits: Globes and orbits are unremarkable. Dependent secretions in the left sphenoid sinus. Remaining sinuses are clear. Other: None. CT CERVICAL SPINE FINDINGS Alignment: Mild kyphosis, apex at C5-C6.  No spondylolisthesis. Skull base and vertebrae: No acute fracture. No primary bone lesion or focal pathologic process. Soft tissues and spinal canal: No prevertebral fluid or swelling. No visible canal hematoma. Disc levels: Moderate loss of disc height at C4-C5, C5-C6 and C6-C7. Disc bulging and endplate spurring noted at these levels, greatest at C5-C6 and C6-C7. No convincing disc herniation. Facet degenerative changes, greatest at C2-C3 and C3-C4. Upper chest: No acute findings. Pleuroparenchymal scarring noted at the lung apices. Other: None. IMPRESSION: HEAD CT 1. No acute intracranial  abnormalities. CERVICAL CT 1. No fracture or acute finding. Electronically Signed   By: Lajean Manes M.D.   On: 07/08/2022 18:47   CT Cervical Spine Wo Contrast  Result Date: 07/08/2022 CLINICAL DATA:  Fall.  Head injury. EXAM: CT HEAD WITHOUT CONTRAST CT CERVICAL SPINE WITHOUT CONTRAST TECHNIQUE: Multidetector CT imaging of the head and cervical spine was performed following the standard protocol without intravenous contrast. Multiplanar CT image reconstructions of the cervical spine were also generated. RADIATION DOSE REDUCTION: This exam was performed according to the departmental dose-optimization program which includes automated exposure control, adjustment of the mA and/or kV according to patient size and/or use of iterative reconstruction technique. COMPARISON:  06/22/2022. FINDINGS: CT HEAD FINDINGS Brain: No evidence of acute infarction, hemorrhage, hydrocephalus, extra-axial collection or mass lesion/mass effect.  Vascular: No hyperdense vessel or unexpected calcification. Skull: Normal. Negative for fracture or focal lesion. Sinuses/Orbits: Globes and orbits are unremarkable. Dependent secretions in the left sphenoid sinus. Remaining sinuses are clear. Other: None. CT CERVICAL SPINE FINDINGS Alignment: Mild kyphosis, apex at C5-C6.  No spondylolisthesis. Skull base and vertebrae: No acute fracture. No primary bone lesion or focal pathologic process. Soft tissues and spinal canal: No prevertebral fluid or swelling. No visible canal hematoma. Disc levels: Moderate loss of disc height at C4-C5, C5-C6 and C6-C7. Disc bulging and endplate spurring noted at these levels, greatest at C5-C6 and C6-C7. No convincing disc herniation. Facet degenerative changes, greatest at C2-C3 and C3-C4. Upper chest: No acute findings. Pleuroparenchymal scarring noted at the lung apices. Other: None. IMPRESSION: HEAD CT 1. No acute intracranial abnormalities. CERVICAL CT 1. No fracture or acute finding. Electronically Signed   By: Lajean Manes M.D.   On: 07/08/2022 18:47    LOS: 1 day   Author: Debbe Odea  07/09/2022 8:40 AM  To contact [facility]>   Check the care team in [facility] and look for the attending/consulting [place] provider listed  Log into [email or web address] and use [facility]'s universal password   Go to> "[facility]"  and find provider  If you still have difficulty reaching the provider, please page the [facility] (Director on Call) for the Hospitalists listed on amion

## 2022-07-09 NOTE — Progress Notes (Signed)
PT Cancellation Note  Patient Details Name: Jordan [name] MRN: [id number] DOB: July 23, 1922   Cancelled Treatment:     Reason Eval/Treat Not Completed: Fatigue/lethargy limiting ability to participate Patient lethargic with haldol administration this AM per nurse. PT to continue to follow and check back as schedule will allow.    Torre Pikus 07/09/2022, 2:13 PM

## 2022-07-09 NOTE — Progress Notes (Signed)
OT Cancellation Note  Patient Details Name: Jordan [name] MRN: [id number] DOB: 17-Jun-1923   Cancelled Treatment:    Reason Eval/Treat Not Completed: Fatigue/lethargy limiting ability to participate Patient lethargic with haldol administration this AM per nurse. OT to continue to follow and check back as schedule will allow.  Rennie Plowman, [place] [facility] Office# [phone]   07/09/2022, 2:12 PM

## 2022-07-10 DIAGNOSIS — E876 Hypokalemia: Secondary | ICD-10-CM | POA: Diagnosis not present

## 2022-07-10 LAB — GLUCOSE, CAPILLARY
Glucose-Capillary: 120 mg/dL — ABNORMAL HIGH (ref 70–99)
Glucose-Capillary: 133 mg/dL — ABNORMAL HIGH (ref 70–99)
Glucose-Capillary: 111 mg/dL — ABNORMAL HIGH (ref 70–99)
Glucose-Capillary: 155 mg/dL — ABNORMAL HIGH (ref 70–99)

## 2022-07-10 NOTE — Progress Notes (Signed)
[facility] Progress Note  Patient: Jordan [name]     JME:[id number]  DOA: 07/08/2022   PCP: Asencion Noble, MD       Brief hospital course: This is a 88 year old male with dementia, CAD, diabetes mellitus, severe aortic stenosis and BPH.  The patient was sent to the ER from the skilled nursing facility due to increasing confusion. The patient had a recent admission to the hospital from 12/5 through 12/21 for 4 acute metabolic encephalopathy.  It was felt that he had dementia with behavioral disturbances, medications were changed and he was eventually discharged to a skilled nursing facility.   He returns only 2 days later with recurrent confusion and agitation and a fall. He was noted to have a potassium of 2.6 and a WBC count of 12.9.  Bicarb noted to be 18 and BUN 33 with a creatinine of 0.84. UA was negative for an infection.  Head and neck CTs obtained in the ED did not show any abnormalities. Subjective:  Patient awake and is enough to answer questions, take meds and eat but then falls back asleep again.  Assessment and Plan: Principal Problem:   Hypokalemia - Adequately replaced and now 4.5  Active Problems:   Dementia with behavioral disturbance ([place]) - He was discharged 2 days ago with Seroquel but apparently was having behavioral disturbances at the skilled nursing facility -His daughter would like to take him back home with her when he discharges from the hospital    Leukocytosis - has improved to normal    Type 2 diabetes mellitus with complication - on SSI with Novolog - cont Metformin    Hypertension/  Chronic diastolic CHF (congestive heart failure)  Moderate to severe Ao stenosis - Metoprolol is being continued     H/o Lymphocytic colitis - cont Entocort     Code Status: DNR Consultants: none Level of Care: Level of care: Med-Surg Total time on patient care: 35 DVT prophylaxis:  enoxaparin (LOVENOX) injection 30 mg Start: 07/10/22 1000      Objective:   Vitals:   07/09/22 0816 07/09/22 1221 07/09/22 2103 07/10/22 0501  BP: (!) 114/51 122/71 (!) 166/70 (!) 144/51  Pulse: (!) 46 (!) 51 79 65  Resp: 20 18 19 16   Temp: 98.3 F (36.8 C) 97.8 F (36.6 C) 98.2 F (36.8 C) 98.5 F (36.9 C)  TempSrc: Oral Oral Oral Axillary  SpO2: 93% 99% 100% 100%  Weight:      Height:       Filed Weights   07/09/22 0033  Weight: 55.7 kg   Exam: General exam: Appears comfortable -very hard of hearing HEENT: oral mucosa moist Respiratory system: Clear to auscultation.  Cardiovascular system: S1 & S2 heard  Gastrointestinal system: Abdomen soft, non-tender, nondistended. Normal bowel sounds   Extremities: No cyanosis, clubbing or edema  CBC: Recent Labs  Lab 07/05/22 0302 07/08/22 1819 07/09/22 0543  WBC 4.7 12.9* 8.1  NEUTROABS  --  11.0* 5.8  HGB 11.0* 11.4* 10.7*  HCT 32.5* 35.9* 34.5*  MCV 88.3 92.8 94.5  PLT 318 354 229    Basic Metabolic Panel: Recent Labs  Lab 07/05/22 0302 07/08/22 1937 07/09/22 0543  NA 138 141 140  K 4.3 2.6* 4.5  CL 103 119* 109  CO2 25 18* 26  GLUCOSE 120* 112* 133*  BUN 36* 33* 43*  CREATININE 1.25* 0.84 1.15  CALCIUM 8.6* 5.9* 8.7*  MG 2.2  --  2.3  PHOS 4.1  --   --  GFR: Estimated Creatinine Clearance: 28.9 mL/min (by C-G formula based on SCr of 1.15 mg/dL).  Scheduled Meds:  aspirin  81 mg Oral Q breakfast   budesonide  3 mg Oral Daily   enoxaparin (LOVENOX) injection  30 mg Subcutaneous Q24H   feeding supplement  237 mL Oral TID PC   finasteride  5 mg Oral Daily   insulin aspart  0-15 Units Subcutaneous TID WC   memantine  10 mg Oral BID   metFORMIN  500 mg Oral QHS   metoprolol tartrate  12.5 mg Oral BID   mirabegron ER  25 mg Oral Daily   pravastatin  80 mg Oral Daily   QUEtiapine  75 mg Oral QHS   senna  1 tablet Oral BID   sodium chloride flush  3 mL Intravenous Q12H   Continuous Infusions:  sodium chloride     Imaging and lab data was personally  reviewed DG Chest Portable 1 View  Result Date: 07/08/2022 CLINICAL DATA:  [place] 2 hours ago EXAM: PORTABLE CHEST 1 VIEW COMPARISON:  06/20/2022, 06/25/2022 FINDINGS: Single frontal view of the chest demonstrates an unremarkable cardiac silhouette. Stable atherosclerosis. No airspace disease, effusion, or pneumothorax. Persistent peripheral ground-glass nodularity left upper lobe overlying the lateral aspect second rib. Please see recent CT chest discussion. No acute bony abnormality. IMPRESSION: 1. No acute intrathoracic process. 2. Stable peripheral ground-glass nodularity within the left upper lobe, please refer to recent chest CT 06/25/2022 discussing likely underlying indolent adenocarcinoma. Electronically Signed   By: Randa Ngo M.D.   On: 07/08/2022 18:58   DG Pelvis Portable  Result Date: 07/08/2022 CLINICAL DATA:  [place] 2 hours ago EXAM: PORTABLE PELVIS 1-2 VIEWS COMPARISON:  None Available. FINDINGS: Single frontal view of the pelvis includes both hips. No acute displaced fracture. Symmetrical bilateral hip osteoarthritis. Prominent degenerative changes of the lumbosacral junction. Diffuse atherosclerosis. IMPRESSION: 1. No acute displaced fracture. Electronically Signed   By: Randa Ngo M.D.   On: 07/08/2022 18:56   CT HEAD WO CONTRAST (5MM)  Result Date: 07/08/2022 CLINICAL DATA:  Fall.  Head injury. EXAM: CT HEAD WITHOUT CONTRAST CT CERVICAL SPINE WITHOUT CONTRAST TECHNIQUE: Multidetector CT imaging of the head and cervical spine was performed following the standard protocol without intravenous contrast. Multiplanar CT image reconstructions of the cervical spine were also generated. RADIATION DOSE REDUCTION: This exam was performed according to the departmental dose-optimization program which includes automated exposure control, adjustment of the mA and/or kV according to patient size and/or use of iterative reconstruction technique. COMPARISON:  06/22/2022. FINDINGS: CT HEAD  FINDINGS Brain: No evidence of acute infarction, hemorrhage, hydrocephalus, extra-axial collection or mass lesion/mass effect. Vascular: No hyperdense vessel or unexpected calcification. Skull: Normal. Negative for fracture or focal lesion. Sinuses/Orbits: Globes and orbits are unremarkable. Dependent secretions in the left sphenoid sinus. Remaining sinuses are clear. Other: None. CT CERVICAL SPINE FINDINGS Alignment: Mild kyphosis, apex at C5-C6.  No spondylolisthesis. Skull base and vertebrae: No acute fracture. No primary bone lesion or focal pathologic process. Soft tissues and spinal canal: No prevertebral fluid or swelling. No visible canal hematoma. Disc levels: Moderate loss of disc height at C4-C5, C5-C6 and C6-C7. Disc bulging and endplate spurring noted at these levels, greatest at C5-C6 and C6-C7. No convincing disc herniation. Facet degenerative changes, greatest at C2-C3 and C3-C4. Upper chest: No acute findings. Pleuroparenchymal scarring noted at the lung apices. Other: None. IMPRESSION: HEAD CT 1. No acute intracranial abnormalities. CERVICAL CT 1. No fracture or acute finding. Electronically  Signed   By: Lajean Manes M.D.   On: 07/08/2022 18:47   CT Cervical Spine Wo Contrast  Result Date: 07/08/2022 CLINICAL DATA:  Fall.  Head injury. EXAM: CT HEAD WITHOUT CONTRAST CT CERVICAL SPINE WITHOUT CONTRAST TECHNIQUE: Multidetector CT imaging of the head and cervical spine was performed following the standard protocol without intravenous contrast. Multiplanar CT image reconstructions of the cervical spine were also generated. RADIATION DOSE REDUCTION: This exam was performed according to the departmental dose-optimization program which includes automated exposure control, adjustment of the mA and/or kV according to patient size and/or use of iterative reconstruction technique. COMPARISON:  06/22/2022. FINDINGS: CT HEAD FINDINGS Brain: No evidence of acute infarction, hemorrhage, hydrocephalus,  extra-axial collection or mass lesion/mass effect. Vascular: No hyperdense vessel or unexpected calcification. Skull: Normal. Negative for fracture or focal lesion. Sinuses/Orbits: Globes and orbits are unremarkable. Dependent secretions in the left sphenoid sinus. Remaining sinuses are clear. Other: None. CT CERVICAL SPINE FINDINGS Alignment: Mild kyphosis, apex at C5-C6.  No spondylolisthesis. Skull base and vertebrae: No acute fracture. No primary bone lesion or focal pathologic process. Soft tissues and spinal canal: No prevertebral fluid or swelling. No visible canal hematoma. Disc levels: Moderate loss of disc height at C4-C5, C5-C6 and C6-C7. Disc bulging and endplate spurring noted at these levels, greatest at C5-C6 and C6-C7. No convincing disc herniation. Facet degenerative changes, greatest at C2-C3 and C3-C4. Upper chest: No acute findings. Pleuroparenchymal scarring noted at the lung apices. Other: None. IMPRESSION: HEAD CT 1. No acute intracranial abnormalities. CERVICAL CT 1. No fracture or acute finding. Electronically Signed   By: Lajean Manes M.D.   On: 07/08/2022 18:47    LOS: 2 days   Author: Debbe Odea  07/10/2022 10:54 AM  To contact [facility]>   Check the care team in [facility] and look for the attending/consulting [place] provider listed  Log into [email or web address] and use [place]'s universal password   Go to> "[facility]"  and find provider  If you still have difficulty reaching the provider, please page the [facility] (Director on Call) for the Hospitalists listed on amion

## 2022-07-10 NOTE — Evaluation (Signed)
Occupational Therapy Evaluation Patient Details Name: Penny Frisbie Beier MRN: [id number] DOB: 1922-07-24 Today's Date: 07/10/2022   History of Present Illness This is a 88 year old male with dementia, CAD, diabetes mellitus, severe aortic stenosis and BPH.  The patient was sent to the ER from the skilled nursing facility due to increasing confusion.  The patient had a recent admission to the hospital from 12/5 through 12/21 for 4 acute metabolic encephalopathy.  It was felt that he had dementia with behavioral disturbances, medications were changed and he was eventually discharged to a skilled nursing facility. He returns only 2 days later with recurrent confusion and agitation and a fall. Patient admitted for hypokalemia.   Clinical Impression   Mr. Burman Keating is a 88 year old man who presents with confusion, generalized weakness, decreased activity tolerance and impaired balance. Patient's PLOF is unknown and family not present. Today he was able to get out of bed, transfer to bedside commode with min assist , assist in wiping during toileting and ambulate 10 feet with walker. He is HOH and needed multimodal cues to participate. He was left in recliner drinking coffee. Per RN - daughter stated she didn't want him to return to a facility and would take him home. No family to corroborate information therefore therapist recommended SNF. If family refuses SNF - family would need to be able to provide 24/7 physical assistance and probably need a BSC. Patient will benefit from skilled OT services while in hospital to improve deficits and learn compensatory strategies as needed in order to improve functional abilities.      Recommendations for follow up therapy are one component of a multi-disciplinary discharge planning process, led by the attending physician.  Recommendations may be updated based on patient status, additional functional criteria and insurance authorization.   Follow Up  Recommendations  Skilled nursing-short term rehab (<3 hours/day)     Assistance Recommended at Discharge Frequent or constant Supervision/Assistance  Patient can return home with the following Assistance with cooking/housework;Assistance with feeding;Direct supervision/assist for medications management;Direct supervision/assist for financial management;Assist for transportation;Help with stairs or ramp for entrance;A lot of help with bathing/dressing/bathroom;A little help with walking and/or transfers    Functional Status Assessment  Patient has had a recent decline in their functional status and/or demonstrates limited ability to make significant improvements in function in a reasonable and predictable amount of time  Equipment Recommendations  Other (comment) (TBD)    Recommendations for Other Services       Precautions / Restrictions Precautions Precautions: Fall Precaution Comments: incontinent, legally blind Restrictions Weight Bearing Restrictions: No      Mobility Bed Mobility Overal bed mobility: Needs Assistance Bed Mobility: Supine to Sit     Supine to sit: Min assist     General bed mobility comments: Min assist for hand hold to pull himself up on.    Transfers Overall transfer level: Needs assistance Equipment used: Rolling walker (2 wheels) Transfers: Sit to/from Stand Sit to Stand: Min guard, +2 safety/equipment Stand pivot transfers: Min assist         General transfer comment: Min guard to stand and min assist for verbal and tactile cues to transfer to [facility], then stand and ambulate approx 10 feet with walker. Patient reports "I'm about to give out"      Balance Overall balance assessment: Needs assistance Sitting-balance support: Feet supported, No upper extremity supported Sitting balance-Leahy Scale: Fair     Standing balance support: During functional activity, Reliant on assistive device for  balance Standing balance-Leahy Scale: Poor                              ADL either performed or assessed with clinical judgement   ADL Overall ADL's : Needs assistance/impaired Eating/Feeding: Set up;Supervision/ safety;Sitting   Grooming: Set up;Sitting;Supervision/safety   Upper Body Bathing: Moderate assistance;Sitting   Lower Body Bathing: Maximal assistance;Sit to/from stand   Upper Body Dressing : Moderate assistance;Sitting   Lower Body Dressing: Maximal assistance;+2 for safety/equipment;Sit to/from stand   Toilet Transfer: Minimal assistance;+2 for safety/equipment;BSC/3in1 Toilet Transfer Details (indicate cue type and reason): tactile cues for walker and hand placement on BSC Toileting- Clothing Manipulation and Hygiene: Maximal assistance;Sit to/from stand Toileting - Clothing Manipulation Details (indicate cue type and reason): Patient able to wipe himself in seated position, history of incontinence     Functional mobility during ADLs: Minimal assistance;+2 for physical assistance;Rolling walker (2 wheels)       Vision Baseline Vision/History: 2 Legally blind Additional Comments: Legally Blind per prior history.     Perception     Praxis      Pertinent Vitals/Pain Pain Assessment Pain Assessment: No/denies pain     Hand Dominance Right   Extremity/Trunk Assessment Upper Extremity Assessment Upper Extremity Assessment: Difficult to assess due to impaired cognition (generally functional for transfers and to bring cup to mouth)       Cervical / Trunk Assessment Cervical / Trunk Assessment: Normal   Communication Communication Communication: HOH   Cognition Arousal/Alertness: Awake/alert Behavior During Therapy: WFL for tasks assessed/performed Overall Cognitive Status: No family/caregiver present to determine baseline cognitive functioning                                 General Comments: Alert to self, year, month. Knows his birthdate and year. Thinks he is in [place]. [place].      General Comments       Exercises     Shoulder Instructions      Home Living Family/patient expects to be discharged to:: Unsure     Type of Home: House                       Home Equipment: Conservation officer, nature (2 wheels)   Additional Comments: Patient was at SNF for 2  days. Per Nursing his daughter wants to take him home. Unsure of baselien abilities. Needing increased assistance since prior admission.      Prior Functioning/Environment Prior Level of Function : Needs assist;Patient poor historian/Family not available;History of Falls (last six months)                        OT Problem List: Decreased strength;Decreased activity tolerance;Impaired balance (sitting and/or standing);Decreased safety awareness;Decreased cognition;Decreased knowledge of use of DME or AE      OT Treatment/Interventions: Self-care/ADL training;DME and/or AE instruction;Therapeutic activities;Balance training;Patient/family education;Therapeutic exercise    OT Goals(Current goals can be found in the care plan section) Acute Rehab OT Goals OT Goal Formulation: Patient unable to participate in goal setting Time For Goal Achievement: 07/24/22 Potential to Achieve Goals: Fair  OT Frequency: Min 2X/week    Co-evaluation PT/OT/SLP Co-Evaluation/Treatment: Yes   PT goals addressed during session: Mobility/safety with mobility OT goals addressed during session: ADL's and self-care      AM-PAC OT "6 Clicks" Daily Activity  Outcome Measure Help from another person eating meals?: A Little Help from another person taking care of personal grooming?: A Little Help from another person toileting, which includes using toliet, bedpan, or urinal?: A Lot Help from another person bathing (including washing, rinsing, drying)?: A Lot Help from another person to put on and taking off regular upper body clothing?: A Lot Help from another person to put on and taking off regular lower body  clothing?: A Lot 6 Click Score: 14   End of Session Equipment Utilized During Treatment: Gait belt;Rolling walker (2 wheels) Nurse Communication: Mobility status  Activity Tolerance: Patient tolerated treatment well Patient left: in chair;with call bell/phone within reach;with chair alarm set;with nursing/sitter in room  OT Visit Diagnosis: Unsteadiness on feet (R26.81);Muscle weakness (generalized) (M62.81);Adult, failure to thrive (R62.7);Other symptoms and signs involving cognitive function;History of falling (Z91.81);Other abnormalities of gait and mobility (R26.89)                Time: 8159-4707 OT Time Calculation (min): 17 min Charges:  OT General Charges $OT Visit: 1 Visit OT Evaluation $OT Eval Low Complexity: 1 Low  Gustavo Lah, OTR/L [place]  Office [phone]   [name] 07/10/2022, 12:49 PM

## 2022-07-10 NOTE — Plan of Care (Signed)

## 2022-07-10 NOTE — Evaluation (Signed)
Physical Therapy Evaluation Patient Details Name: Jordan [name] MRN: [id number] DOB: 1923/05/04 Today's Date: 07/10/2022  History of Present Illness  This is a 88 year old male with dementia, CAD, diabetes mellitus, severe aortic stenosis and BPH.  The patient was sent to the ER from the skilled nursing facility due to increasing confusion.  The patient had a recent admission to the hospital from 12/5 through 12/21 for 4 acute metabolic encephalopathy.  It was felt that he had dementia with behavioral disturbances, medications were changed and he was eventually discharged to a skilled nursing facility. He returns only 2 days later with recurrent confusion and agitation and a fall. Patient admitted for hypokalemia.  Clinical Impression  Pt admitted with above diagnosis. Pt ambulated 10' with RW with min/guard assist, distance limited by fatigue. Per chart pt ambulated 140' with RW on 07/06/22 during recent admission. Noted decline in activity tolerance today.  Pt currently with functional limitations due to the deficits listed below (see PT Problem List). Pt will benefit from skilled PT to increase their independence and safety with mobility to allow discharge to the venue listed below.          Recommendations for follow up therapy are one component of a multi-disciplinary discharge planning process, led by the attending physician.  Recommendations may be updated based on patient status, additional functional criteria and insurance authorization.  Follow Up Recommendations Skilled nursing-short term rehab (<3 hours/day) Can patient physically be transported by private vehicle: No    Assistance Recommended at Discharge Frequent or constant Supervision/Assistance  Patient can return home with the following  A lot of help with bathing/dressing/bathroom;Assist for transportation;Assistance with feeding;Direct supervision/assist for financial management;Direct supervision/assist for  medications management;Assistance with cooking/housework;A little help with walking and/or transfers    Equipment Recommendations None recommended by PT  Recommendations for Other Services       Functional Status Assessment Patient has had a recent decline in their functional status and demonstrates the ability to make significant improvements in function in a reasonable and predictable amount of time.     Precautions / Restrictions Precautions Precautions: Fall Precaution Comments: incontinent, legally blind Restrictions Weight Bearing Restrictions: No      Mobility  Bed Mobility         Supine to sit: Min assist, HOB elevated     General bed mobility comments: min A to raise trunk, use of bedrail    Transfers                        Ambulation/Gait Ambulation/Gait assistance: Min guard Gait Distance (Feet): 10 Feet Assistive device: Rolling walker (2 wheels) Gait Pattern/deviations: Step-through pattern, Decreased stride length, Trunk flexed Gait velocity: decr     General Gait Details: distance limited by fatigue, VCs to lift head  Stairs            Wheelchair Mobility    Modified Rankin (Stroke Patients Only)       Balance Overall balance assessment: Needs assistance Sitting-balance support: Feet supported, No upper extremity supported Sitting balance-Leahy Scale: Fair     Standing balance support: During functional activity, Reliant on assistive device for balance, Bilateral upper extremity supported Standing balance-Leahy Scale: Poor                               Pertinent Vitals/Pain Pain Assessment Pain Assessment: Faces Faces Pain Scale: No hurt Breathing: normal Negative Vocalization: none  Facial Expression: smiling or inexpressive Body Language: relaxed Consolability: no need to console PAINAD Score: 0 Pain Intervention(s): Monitored during session    Home Living Family/patient expects to be discharged  to:: Unsure     Type of Home: House           Home Equipment: Conservation officer, nature (2 wheels) Additional Comments: Patient was at [facility] for 2  days. Per Nursing his daughter wants to take him home. Unsure of baselien abilities. Needing increased assistance since prior admission.    Prior Function Prior Level of Function : Needs assist;Patient poor historian/Family not available;History of Falls (last six months)                     Hand Dominance   Dominant Hand: Right    Extremity/Trunk Assessment   Upper Extremity Assessment Upper Extremity Assessment: Difficult to assess due to impaired cognition (generally functional for transfers and to bring cup to mouth)         Cervical / Trunk Assessment Cervical / Trunk Assessment: Normal  Communication   Communication: [facility]  Cognition                                                General Comments      Exercises     Assessment/Plan    PT Assessment Patient needs continued PT services  PT Problem List Decreased strength;Decreased mobility;Decreased safety awareness;Decreased activity tolerance;Decreased balance;Decreased knowledge of use of DME;Pain;Decreased skin integrity       PT Treatment Interventions DME instruction;Therapeutic activities;Therapeutic exercise;Gait training;Patient/family education;Balance training;Stair training;Functional mobility training;Neuromuscular re-education    PT Goals (Current goals can be found in the Care Plan section)  Acute Rehab PT Goals Patient Stated Goal: none stated PT Goal Formulation: Patient unable to participate in goal setting Time For Goal Achievement: 07/24/22 Potential to Achieve Goals: Fair    Frequency Min 2X/week     Co-evaluation PT/OT/SLP Co-Evaluation/Treatment: Yes   PT goals addressed during session: Mobility/safety with mobility OT goals addressed during session: ADL's and self-care       AM-PAC PT "6 Clicks" Mobility   Outcome Measure Help needed turning from your back to your side while in a flat bed without using bedrails?: A Little Help needed moving from lying on your back to sitting on the side of a flat bed without using bedrails?: A Little Help needed moving to and from a bed to a chair (including a wheelchair)?: A Little Help needed standing up from a chair using your arms (e.g., wheelchair or bedside chair)?: A Little Help needed to walk in hospital room?: A Little Help needed climbing 3-5 steps with a railing? : A Lot 6 Click Score: 17    End of Session Equipment Utilized During Treatment: Gait belt Activity Tolerance: Patient limited by fatigue;Patient tolerated treatment well Patient left: in chair;with call bell/phone within reach;with chair alarm set Nurse Communication: Mobility status PT Visit Diagnosis: Other abnormalities of gait and mobility (R26.89);Muscle weakness (generalized) (M62.81);Difficulty in walking, not elsewhere classified (R26.2)    Time: 7591-6384 PT Time Calculation (min) (ACUTE ONLY): 18 min   Charges:   PT Evaluation $PT Eval Moderate Complexity: 1 Mod          {Earnest Thalman, Glade [facility] PT 07/10/2022  Acute Rehabilitation Services  Office [phone]

## 2022-07-11 DIAGNOSIS — E876 Hypokalemia: Secondary | ICD-10-CM | POA: Diagnosis not present

## 2022-07-11 LAB — GLUCOSE, CAPILLARY
Glucose-Capillary: 118 mg/dL — ABNORMAL HIGH (ref 70–99)
Glucose-Capillary: 133 mg/dL — ABNORMAL HIGH (ref 70–99)
Glucose-Capillary: 153 mg/dL — ABNORMAL HIGH (ref 70–99)
Glucose-Capillary: 141 mg/dL — ABNORMAL HIGH (ref 70–99)

## 2022-07-11 LAB — CALCIUM, IONIZED: Calcium, Ionized, Serum: 5.1 mg/dL (ref 4.5–5.6)

## 2022-07-11 NOTE — NC FL2 (Signed)
[place] LEVEL OF CARE FORM     IDENTIFICATION  Patient Name: Jordan [name] Birthdate: 01-31-1923 Sex: male Admission Date (Current Location): 07/08/2022  [facility] and [place] Number:  Herbalist and Address:  [facility],  [place], [place]      Provider Number: [id number]  Attending Physician Name and Address:  Debbe Odea, MD  Relative Name and Phone Number:  Daughter, [name] [phone]    Current Level of Care: Hospital Recommended Level of Care: [place] Prior Approval Number:    Date Approved/Denied:   PASRR Number: [id number]  Discharge Plan: SNF    Current Diagnoses: Patient Active Problem List   Diagnosis Date Noted   Hypokalemia 07/08/2022   Leukocytosis 07/08/2022   Chronic diastolic CHF (congestive heart failure) ([place]) 07/08/2022   Acute encephalopathy 81/27/5170   Acute metabolic encephalopathy 01/74/9449   Confusion with nonfocal neurological examination 06/20/2022   Dementia with behavioral disturbance ([place]) 06/20/2022   Lymphocytic colitis 10/13/2019   Benign prostatic hyperplasia with urinary obstruction 08/20/2019   Nocturia 08/20/2019   Pancreatitis 06/13/2016   Type 2 diabetes mellitus with complication ([place])    S/P drug eluting coronary stent placement    Hypertension    Hyperlipidemia    History of colonic polyps    Diverticulosis of colon without hemorrhage    Hemorrhoids 10/23/2014   Constipation 06/03/2012   Balance disorder/Fall at home 05/29/2012    Orientation RESPIRATION BLADDER Height & Weight     Self  Normal Incontinent, External catheter Weight: 122 lb 12.7 oz (55.7 kg) Height:  5\' 9"  (175.3 cm)  BEHAVIORAL SYMPTOMS/MOOD NEUROLOGICAL BOWEL NUTRITION STATUS      Incontinent Diet (Regular)  AMBULATORY STATUS COMMUNICATION OF NEEDS Skin   Limited Assist Verbally Skin abrasions, Other (Comment) (Skin tear- lower arm and left tibial)                        Personal Care Assistance Level of Assistance  Feeding, Dressing Bathing Assistance: Maximum assistance Feeding assistance: Limited assistance Dressing Assistance: Maximum assistance     Functional Limitations Info  Sight, Hearing, Speech Sight Info: Impaired Hearing Info: Impaired Speech Info: Adequate    SPECIAL CARE FACTORS FREQUENCY  OT (By licensed OT), PT (By licensed PT)     PT Frequency: 5x/wk OT Frequency: 5x/wk            Contractures Contractures Info: Not present    Additional Factors Info  Code Status, Allergies, Psychotropic Code Status Info: DNR Allergies Info: No known allergies Psychotropic Info: See MAR         Current Medications (07/11/2022):  This is the current hospital active medication list Current Facility-Administered Medications  Medication Dose Route Frequency Provider Last Rate Last Admin   0.9 %  sodium chloride infusion  250 mL Intravenous PRN Norins, Heinz Knuckles, MD       acetaminophen (TYLENOL) tablet 650 mg  650 mg Oral Q6H PRN Norins, Heinz Knuckles, MD       Or   acetaminophen (TYLENOL) suppository 650 mg  650 mg Rectal Q6H PRN Norins, Heinz Knuckles, MD       aspirin chewable tablet 81 mg  81 mg Oral Q breakfast Curatolo, Adam, DO   81 mg at 07/11/22 0909   budesonide (ENTOCORT EC) 24 hr capsule 3 mg  3 mg Oral Daily Curatolo, Adam, DO   3 mg at 07/11/22 0909   enoxaparin (  LOVENOX) injection 30 mg  30 mg Subcutaneous Q24H Rizwan, Eunice Blase, MD   30 mg at 07/11/22 0909   feeding supplement (ENSURE ENLIVE / ENSURE PLUS) liquid 237 mL  237 mL Oral TID PC Curatolo, Adam, DO       finasteride (PROSCAR) tablet 5 mg  5 mg Oral Daily Curatolo, Adam, DO   5 mg at 07/11/22 9977   haloperidol lactate (HALDOL) injection 1 mg  1 mg Intravenous Q6H PRN Norins, Heinz Knuckles, MD   1 mg at 07/09/22 0414   insulin aspart (novoLOG) injection 0-15 Units  0-15 Units Subcutaneous TID WC Norins, Heinz Knuckles, MD   2 Units at 07/10/22 1238    LORazepam (ATIVAN) injection 0.5 mg  0.5 mg Intravenous Q4H PRN Neena Rhymes, MD   0.5 mg at 07/09/22 0332   memantine (NAMENDA) tablet 10 mg  10 mg Oral BID Curatolo, Adam, DO   10 mg at 07/11/22 4142   metFORMIN (GLUCOPHAGE) tablet 500 mg  500 mg Oral QHS Curatolo, Adam, DO   500 mg at 07/10/22 2129   metoprolol tartrate (LOPRESSOR) tablet 12.5 mg  12.5 mg Oral BID Curatolo, Adam, DO   12.5 mg at 07/11/22 3953   mirabegron ER (MYRBETRIQ) tablet 25 mg  25 mg Oral Daily Curatolo, Adam, DO   25 mg at 07/11/22 0909   pravastatin (PRAVACHOL) tablet 80 mg  80 mg Oral Daily Curatolo, Adam, DO   80 mg at 07/11/22 2023   QUEtiapine (SEROQUEL) tablet 75 mg  75 mg Oral QHS Curatolo, Adam, DO   75 mg at 07/10/22 2130   senna (SENOKOT) tablet 8.6 mg  1 tablet Oral BID Neena Rhymes, MD   8.6 mg at 07/11/22 3435   sodium chloride flush (NS) 0.9 % injection 3 mL  3 mL Intravenous Q12H Norins, Heinz Knuckles, MD   3 mL at 07/10/22 2130   sodium chloride flush (NS) 0.9 % injection 3 mL  3 mL Intravenous PRN Norins, Heinz Knuckles, MD         Discharge Medications: Please see discharge summary for a list of discharge medications.  Relevant Imaging Results:  Relevant Lab Results:   Additional Information SSN: [id number]  Vassie Moselle, LCSW

## 2022-07-11 NOTE — Progress Notes (Signed)
[facility] Progress Note  Patient: Jordan [name]     JHE:[id number]  DOA: 07/08/2022   PCP: Asencion Noble, MD       Brief hospital course: This is a 88 year old male with dementia, CAD, diabetes mellitus, severe aortic stenosis and BPH.  The patient was sent to the ER from the skilled nursing facility due to increasing confusion. The patient had a recent admission to the hospital from 12/5 through 12/21 for 4 acute metabolic encephalopathy.  It was felt that he had dementia with behavioral disturbances, medications were changed and he was eventually discharged to a skilled nursing facility.   He returns only 2 days later with recurrent confusion and agitation and a fall. He was noted to have a potassium of 2.6 and a WBC count of 12.9.  Bicarb noted to be 18 and BUN 33 with a creatinine of 0.84. UA was negative for an infection.  Head and neck CTs obtained in the ED did not show any abnormalities. Subjective:  He has no complaints.  Still very somnolent  Assessment and Plan: Principal Problem:   Hypokalemia - Adequately replaced and now 4.5  Active Problems:   Dementia with behavioral disturbance ([place]) - He was discharged 2 days ago with Seroquel but apparently was having behavioral disturbances at the skilled nursing facility -His daughter initially consider taking him home from the hospital however has changed her mind and would like a different skilled nursing facility then [facility] -No behavioral disturbances noted in the hospital-he is stable on Seroquel    Leukocytosis - has improved to normal    Type 2 diabetes mellitus with complication - on SSI with Novolog - cont Metformin    Hypertension/  Chronic diastolic CHF (congestive heart failure)  Moderate to severe Ao stenosis - Metoprolol is being continued     H/o Lymphocytic colitis - cont Entocort     Code Status: DNR Consultants: none Level of Care: Level of care: Med-Surg Total time on patient  care: 35 DVT prophylaxis:  enoxaparin (LOVENOX) injection 30 mg Start: 07/10/22 1000     Objective:   Vitals:   07/10/22 1258 07/10/22 2032 07/11/22 0451 07/11/22 1315  BP:  124/65 130/64 117/71  Pulse:  80 77 70  Resp:  18 18 18   Temp: 97.8 F (36.6 C) 98.4 F (36.9 C) 97.7 F (36.5 C) 98.4 F (36.9 C)  TempSrc: Oral Oral Oral Oral  SpO2:  100% 97% 97%  Weight:      Height:       Filed Weights   07/09/22 0033  Weight: 55.7 kg   Exam: General exam: Appears comfortable -very hard of hearing HEENT: oral mucosa moist Respiratory system: Clear to auscultation.  Cardiovascular system: S1 & S2 heard  Gastrointestinal system: Abdomen soft, non-tender, nondistended. Normal bowel sounds   Extremities: No cyanosis, clubbing or edema  CBC: Recent Labs  Lab 07/05/22 0302 07/08/22 1819 07/09/22 0543  WBC 4.7 12.9* 8.1  NEUTROABS  --  11.0* 5.8  HGB 11.0* 11.4* 10.7*  HCT 32.5* 35.9* 34.5*  MCV 88.3 92.8 94.5  PLT 318 354 185    Basic Metabolic Panel: Recent Labs  Lab 07/05/22 0302 07/08/22 1937 07/09/22 0543  NA 138 141 140  K 4.3 2.6* 4.5  CL 103 119* 109  CO2 25 18* 26  GLUCOSE 120* 112* 133*  BUN 36* 33* 43*  CREATININE 1.25* 0.84 1.15  CALCIUM 8.6* 5.9* 8.7*  MG 2.2  --  2.3  PHOS 4.1  --   --  GFR: Estimated Creatinine Clearance: 28.9 mL/min (by C-G formula based on SCr of 1.15 mg/dL).  Scheduled Meds:  aspirin  81 mg Oral Q breakfast   budesonide  3 mg Oral Daily   enoxaparin (LOVENOX) injection  30 mg Subcutaneous Q24H   feeding supplement  237 mL Oral TID PC   finasteride  5 mg Oral Daily   insulin aspart  0-15 Units Subcutaneous TID WC   memantine  10 mg Oral BID   metFORMIN  500 mg Oral QHS   metoprolol tartrate  12.5 mg Oral BID   mirabegron ER  25 mg Oral Daily   pravastatin  80 mg Oral Daily   QUEtiapine  75 mg Oral QHS   senna  1 tablet Oral BID   sodium chloride flush  3 mL Intravenous Q12H   Continuous Infusions:  sodium  chloride     Imaging and lab data was personally reviewed No results found.  LOS: 3 days   Author: Debbe Odea  07/11/2022 3:08 PM  To contact [facility]>   Check the care team in [facility] and look for the attending/consulting [place] provider listed  Log into [email or web address] and use [place]'s universal password   Go to> "[facility]"  and find provider  If you still have difficulty reaching the provider, please page the [facility] (Director on Call) for the Hospitalists listed on amion

## 2022-07-11 NOTE — TOC Initial Note (Signed)
Transition of Care [facility]) - Initial/Assessment Note    Patient Details  Name: Jordan [name] MRN: [id number] Date of Birth: 04/20/1923  [facility] of [facility] CM/SW Contact:    Vassie Moselle, LCSW Phone Number: 07/11/2022, 12:14 PM  Clinical Narrative:                 Spoke with pt's daughter via t/c who shares that pt was at [facility] for 2 days before falling and returning to hospital. She is not satisfied with services provided at [facility] and does not want pt to return. She is open to additional SNF placement at a different facility and is agreeable for referrals to be sent out.  Pt has been referred out for SNF and currently awaiting bed offers.   Expected Discharge Plan: Skilled Nursing Facility Barriers to Discharge: SNF Pending bed offer   Patient Goals and CMS Choice Patient states their goals for this hospitalization and ongoing recovery are:: For pt to progress to go home CMS Medicare.gov Compare Post Acute Care list provided to:: Patient Represenative (must comment) Choice offered to / list presented to : Adult [place] ownership interest in [facility].provided to:: Adult Children    Expected Discharge Plan and Services In-house Referral: Clinical Social Work Discharge Planning Services: CM Consult Post Acute Care Choice: [place] Living arrangements for the past 2 months: [place], [place]                 DME Arranged: [name] DME Agency: NA                  Prior Living Arrangements/Services Living arrangements for the past 2 months: [place], [place] Lives with:: Adult Children, Facility Resident Patient language and need for interpreter reviewed:: Yes Do you feel safe going back to the place where you live?: Yes      Need for Family Participation in Patient Care: Yes (Comment) Care giver support system in place?: Yes (comment) Current home  services: DME Criminal Activity/Legal Involvement Pertinent to Current Situation/Hospitalization: No - Comment as needed  Activities of Daily Living Home Assistive Devices/Equipment: Eyeglasses, Hearing aid, Environmental consultant (specify type) ADL Screening (condition at time of admission) Patient's cognitive ability adequate to safely complete daily activities?: No Is the patient deaf or have difficulty hearing?: Yes Does the patient have difficulty seeing, even when wearing glasses/contacts?: Yes (legally blind) Does the patient have difficulty concentrating, remembering, or making decisions?: Yes Patient able to express need for assistance with ADLs?: Yes Does the patient have difficulty dressing or bathing?: Yes Independently performs ADLs?: No Communication: Independent Dressing (OT): Dependent Is this a change from baseline?: Pre-admission baseline Grooming: Dependent Is this a change from baseline?: Change from baseline, expected to last >3 days Feeding: Needs assistance Is this a change from baseline?: Pre-admission baseline Bathing: Dependent Is this a change from baseline?: Pre-admission baseline Toileting: Dependent Is this a change from baseline?: Pre-admission baseline In/Out Bed: Dependent Is this a change from baseline?: Pre-admission baseline Walks in Home: Independent with device (comment) (walker) Does the patient have difficulty walking or climbing stairs?: Yes Weakness of Legs: Both Weakness of Arms/Hands: Both  Permission Sought/Granted Permission sought to share information with : Facility Sport and exercise psychologist, Family Supports Permission granted to share information with : Yes, Verbal Permission Granted  Share Information with NAME: [name]     Permission granted to share info w Relationship: Daughter  Permission granted to share info  w Contact Information: [phone]  Emotional Assessment Appearance:: Appears stated age Attitude/Demeanor/Rapport: Unable to  Assess Affect (typically observed): Unable to Assess Orientation: : Oriented to Self Alcohol / Substance Use: Not Applicable Psych Involvement: No (comment)  Admission diagnosis:  Hypocalcemia [E83.51] Hypokalemia [E87.6] Fall, initial encounter [W19.XXXA] Encounter for social work intervention [Z76.89] Patient Active Problem List   Diagnosis Date Noted   Hypokalemia 07/08/2022   Leukocytosis 07/08/2022   Chronic diastolic CHF (congestive heart failure) ([place]) 07/08/2022   Acute encephalopathy 86/57/8469   Acute metabolic encephalopathy 62/95/2841   Confusion with nonfocal neurological examination 06/20/2022   Dementia with behavioral disturbance ([place]) 06/20/2022   Lymphocytic colitis 10/13/2019   Benign prostatic hyperplasia with urinary obstruction 08/20/2019   Nocturia 08/20/2019   Pancreatitis 06/13/2016   Type 2 diabetes mellitus with complication (HCC)    S/P drug eluting coronary stent placement    Hypertension    Hyperlipidemia    History of colonic polyps    Diverticulosis of colon without hemorrhage    Hemorrhoids 10/23/2014   Constipation 06/03/2012   Balance disorder/Fall at home 05/29/2012   PCP:  Asencion Noble, MD Pharmacy:   [facility] [place] [address] Phone: [phone] Fax: [phone]     Social Determinants of Health ([place]) Social History: [place]: No Food Insecurity (07/09/2022)  Housing: Low Risk  (07/09/2022)  Transportation Needs: No Transportation Needs (07/09/2022)  Utilities: Not At Risk (07/09/2022)  Tobacco Use: Medium Risk (07/08/2022)   SDOH Interventions:     Readmission Risk Interventions    07/11/2022   12:11 PM  Readmission Risk Prevention Plan  Transportation Screening Complete  PCP or Specialist Appt within 3-5 Days Complete  HRI or [place] Complete  Social Work Consult for [place] Planning/Counseling Complete   Palliative Care Screening Not Applicable  Medication Review Press photographer) Complete

## 2022-07-12 DIAGNOSIS — E876 Hypokalemia: Secondary | ICD-10-CM | POA: Diagnosis not present

## 2022-07-12 DIAGNOSIS — Z7689 Persons encountering health services in other specified circumstances: Secondary | ICD-10-CM

## 2022-07-12 DIAGNOSIS — W19XXXA Unspecified fall, initial encounter: Secondary | ICD-10-CM | POA: Diagnosis not present

## 2022-07-12 LAB — GLUCOSE, CAPILLARY
Glucose-Capillary: 104 mg/dL — ABNORMAL HIGH (ref 70–99)
Glucose-Capillary: 116 mg/dL — ABNORMAL HIGH (ref 70–99)
Glucose-Capillary: 99 mg/dL (ref 70–99)
Glucose-Capillary: 104 mg/dL — ABNORMAL HIGH (ref 70–99)

## 2022-07-12 LAB — BASIC METABOLIC PANEL
Anion gap: 6 (ref 5–15)
BUN: 41 mg/dL — ABNORMAL HIGH (ref 8–23)
CO2: 27 mmol/L (ref 22–32)
Calcium: 8.6 mg/dL — ABNORMAL LOW (ref 8.9–10.3)
Chloride: 107 mmol/L (ref 98–111)
Creatinine, Ser: 1.23 mg/dL (ref 0.61–1.24)
GFR, Estimated: 53 mL/min — ABNORMAL LOW (ref >60)
Glucose, Bld: 112 mg/dL — ABNORMAL HIGH (ref 70–99)
Potassium: 3.8 mmol/L (ref 3.5–5.1)
Sodium: 140 mmol/L (ref 135–145)

## 2022-07-12 NOTE — Progress Notes (Signed)
Occupational Therapy Treatment Patient Details Name: Jordan [name] MRN: [id number] DOB: [birth year] Today's Date: 07/12/2022   History of present illness This is a 88 year old male with dementia, CAD, diabetes mellitus, severe aortic stenosis and BPH.  The patient was sent to the ER from the skilled nursing facility due to increasing confusion.  The patient had a recent admission to the hospital from 12/5 through 12/21 for 4 acute metabolic encephalopathy.  It was felt that he had dementia with behavioral disturbances, medications were changed and he was eventually discharged to a skilled nursing facility. He returns only 2 days later with recurrent confusion and agitation and a fall. Patient admitted for hypokalemia.   OT comments  Patient mod assist to transfer to edge of bed and min assist to stand and take steps to recliner. Patient exhibiting posterior lean in standing and needing therapist to assist to maintain standing balance. Once in chair - patient needed set up for meal tray and supervision during meal to feed himself. He needed verbal cues as he was spooning an empty spoon/fork to his mouth. Continue to recommend short term rehab at discharge.    Recommendations for follow up therapy are one component of a multi-disciplinary discharge planning process, led by the attending physician.  Recommendations may be updated based on patient status, additional functional criteria and insurance authorization.    Follow Up Recommendations  Skilled nursing-short term rehab (<3 hours/day)     Assistance Recommended at Discharge Frequent or constant Supervision/Assistance  Patient can return home with the following  Assistance with cooking/housework;Assistance with feeding;Direct supervision/assist for medications management;Direct supervision/assist for financial management;Assist for transportation;Help with stairs or ramp for entrance;A lot of help with bathing/dressing/bathroom;A little  help with walking and/or transfers   Equipment Recommendations  Other (comment) (TBD)    Recommendations for Other Services      Precautions / Restrictions Precautions Precautions: Fall Precaution Comments: incontinent, legally blind Restrictions Weight Bearing Restrictions: No          Balance Overall balance assessment: Needs assistance Sitting-balance support: Feet supported, No upper extremity supported Sitting balance-Leahy Scale: Fair     Standing balance support: During functional activity, Reliant on assistive device for balance Standing balance-Leahy Scale: Poor Standing balance comment: Reliant on therapist assistance                           ADL either performed or assessed with clinical judgement   ADL Overall ADL's : Needs assistance/impaired Eating/Feeding: Set up;Supervision/ safety;Sitting Eating/Feeding Details (indicate cue type and reason): Able to drink from cup after setup and able to spoon food. Needed verbal cues to spear food when he was spooning empty spoon to mouth.                                 Functional mobility during ADLs: Moderate assistance;Rolling walker (2 wheels);Minimal assistance General ADL Comments: Mod assist to transfer to edge of bed needing assistance for LEs. Patient min assist to stand and take steps. When taking steps backward to chair patient losing balance posteriorly and needing physical assistance to maintain balance    Extremity/Trunk Assessment              Vision   Vision Assessment?: No apparent visual deficits Additional Comments: Legally Blind          Cognition Arousal/Alertness: Awake/alert Behavior During Therapy: [facility] for tasks assessed/performed Overall  Cognitive Status: No family/caregiver present to determine baseline cognitive functioning                                                     Pertinent Vitals/ Pain       Pain Assessment Pain  Assessment: No/denies pain   Frequency  Min 2X/week        Progress Toward Goals  OT Goals(current goals can now be found in the care plan section)  Progress towards OT goals: Progressing toward goals  Acute Rehab OT Goals OT Goal Formulation: Patient unable to participate in goal setting Time For Goal Achievement: 07/24/22 Potential to Achieve Goals: [place] Discharge plan remains appropriate;Frequency remains appropriate    Co-evaluation          OT goals addressed during session: ADL's and self-care      AM-PAC OT "6 Clicks" Daily Activity     Outcome Measure   Help from another person eating meals?: A Little Help from another person taking care of personal grooming?: A Little Help from another person toileting, which includes using toliet, bedpan, or urinal?: A Lot Help from another person bathing (including washing, rinsing, drying)?: A Lot Help from another person to put on and taking off regular upper body clothing?: A Lot Help from another person to put on and taking off regular lower body clothing?: A Lot 6 Click Score: 14    End of Session Equipment Utilized During Treatment: Gait belt;Rolling walker (2 wheels)  OT Visit Diagnosis: Unsteadiness on feet (R26.81);Muscle weakness (generalized) (M62.81);Adult, failure to thrive (R62.7);Other symptoms and signs involving cognitive function;History of falling (Z91.81);Other abnormalities of gait and mobility (R26.89)   Activity Tolerance Patient tolerated treatment well   Patient Left in chair;with call bell/phone within reach;with chair alarm set;with nursing/sitter in room   Nurse Communication Mobility status        Time: 6945-0388 OT Time Calculation (min): 14 min  Charges: OT General Charges $OT Visit: 1 Visit OT Treatments $Therapeutic Activity: 8-22 mins  Gustavo Lah, OTR/L [place]  Office [phone]   [name] 07/12/2022, 1:01 PM

## 2022-07-12 NOTE — Progress Notes (Signed)
  Progress Note   Patient: Jordan [name] GLO:[id number] DOB: [birth year] DOA: 07/08/2022     4 DOS: the patient was seen and examined on 07/12/2022   Brief hospital course: 88 year old male with dementia, CAD, diabetes mellitus, severe aortic stenosis and BPH.  The patient was sent to the ER from the skilled nursing facility due to increasing confusion. The patient had a recent admission to the hospital from 12/5 through 12/21 for 4 acute metabolic encephalopathy.  It was felt that he had dementia with behavioral disturbances, medications were changed and he was eventually discharged to a skilled nursing facility.   He returns only 2 days later with recurrent confusion and agitation and a fall. He was noted to have a potassium of 2.6 and a WBC count of 12.9.  Bicarb noted to be 18 and BUN 33 with a creatinine of 0.84. UA was negative for an infection.  Head and neck CTs obtained in the ED did not show any abnormalities.  Assessment and Plan: Principal Problem:   Hypokalemia - K stable at 3.8 - Cont to follow lytes and replace as needed   Active Problems:   Dementia with behavioral disturbance ([place]) - He was discharged 2 days prior with Seroquel but apparently was having behavioral disturbances at the skilled nursing facility - Still required dose of haldol last night. Per TOC, cannot receive PRN antipsychotic or have sitter or restraint x 24hr     Leukocytosis - has improved to normal     Type 2 diabetes mellitus with complication - on SSI with Novolog - cont Metformin -glycemic trends stable     Hypertension/  Chronic diastolic CHF (congestive heart failure)  Moderate to severe Ao stenosis - Metoprolol is being continued      H/o Lymphocytic colitis - cont Entocort      Subjective: Pleasantly confused this AM  Physical Exam: Vitals:   07/11/22 1315 07/11/22 1951 07/12/22 0539 07/12/22 1429  BP: 117/71 (!) 114/47 (!) 133/53 (!) 126/53  Pulse: 70 78 69 67  Resp: 18  18 16 18   Temp: 98.4 F (36.9 C) 97.6 F (36.4 C) 97.6 F (36.4 C) 98.3 F (36.8 C)  TempSrc: Oral Oral Oral   SpO2: 97% 98% 99% 100%  Weight:      Height:       General exam: Awake, laying in bed, in nad Respiratory system: Normal respiratory effort, no wheezing Cardiovascular system: regular rate, s1, s2 Gastrointestinal system: Soft, nondistended, positive BS Central nervous system: CN2-12 grossly intact, strength intact Extremities: Perfused, no clubbing Skin: Normal skin turgor, no notable skin lesions seen Psychiatry: difficult to assess given mentation  Data Reviewed:  Labs reviewed: Na 140, K 3.8, Cr 1.23  Family Communication: Pt in room, family not at bedside  Disposition: Status is: Inpatient Remains inpatient appropriate because: Severity of illness  Planned Discharge Destination: Skilled nursing facility    Author: Marylu Lund, MD 07/12/2022 5:08 PM  For on call review [email or web address].

## 2022-07-12 NOTE — TOC Progression Note (Signed)
Transition of Care [facility]) - Progression Note    Patient Details  Name: Jordan [name] MRN: [id number] Date of Birth: 1923-05-23  Transition of Care [facility]) CM/SW [place], LCSW Phone Number: 07/12/2022, 9:21 AM  Clinical Narrative:    Pt currently does not have SNF bed offer. Prior to facilities being able to accept pt will need to have no PRN's for agitation, sitter, or restraints. Per chart review pt received PRN haldol at 1am.    Expected Discharge Plan: [place] Barriers to Discharge: SNF Pending bed offer  Expected Discharge Plan and Services In-house Referral: Clinical Social Work Discharge Planning Services: CM Consult Post Acute Care Choice: [place] arrangements for the past 2 months: [place], [place]                 DME Arranged: [name] DME Agency: NA                   Social Determinants of Health ([place]) Interventions [place]: No Food Insecurity (07/09/2022)  Housing: Low Risk  (07/09/2022)  Transportation Needs: No Transportation Needs (07/09/2022)  Utilities: Not At Risk (07/09/2022)  Tobacco Use: Medium Risk (07/08/2022)    Readmission Risk Interventions    07/11/2022   12:11 PM  Readmission Risk Prevention Plan  Transportation Screening Complete  PCP or Specialist Appt within 3-5 Days Complete  HRI or [place] Complete  Social Work Consult for [place] Planning/Counseling Complete  Palliative Care Screening Not Applicable  Medication Review Press photographer) Complete

## 2022-07-12 NOTE — Hospital Course (Signed)
88 year old male with dementia, CAD, diabetes mellitus, severe aortic stenosis and BPH.  The patient was sent to the ER from the skilled nursing facility due to increasing confusion. The patient had a recent admission to the hospital from 12/5 through 12/21 for 4 acute metabolic encephalopathy.  It was felt that he had dementia with behavioral disturbances, medications were changed and he was eventually discharged to a skilled nursing facility.   He returns only 2 days later with recurrent confusion and agitation and a fall. He was noted to have a potassium of 2.6 and a WBC count of 12.9.  Bicarb noted to be 18 and BUN 33 with a creatinine of 0.84. UA was negative for an infection.  Head and neck CTs obtained in the ED did not show any abnormalities.

## 2022-07-12 NOTE — Consult Note (Signed)
   [facility] Inpatient Consult   07/12/2022  Jowell Bossi Perillo [birth year] [id number]  [place] Organization [ACO] Patient: Sherre Poot [place] Medicare  *[facility] Liaison remote coverage review for patient admitted to [facility] with less than 7 days readmission  Primary Care Provider:  Asencion Noble, MD  Patient screened for hospitalization with noted high  risk score for unplanned readmission risk to assess for potential [place] Management service needs for post hospital transition for care coordination.  Review of patient's electronic medical record reveals patient is from recent admission to [facility] with an unwitnessed fall.   Plan:  Continue to follow progress and disposition to assess for post hospital community care coordination/management needs.  Referral request for community care coordination:  follow up needed  Of note, [place] does not replace or interfere with any arrangements made by the Inpatient Transition of Care team.  For questions contact:   Natividad Brood, RN BSN [place]  [phone] business mobile phone Toll free office [phone]  *[place]  [phone] Fax number: [phone] [email or web address]

## 2022-07-13 DIAGNOSIS — Z7689 Persons encountering health services in other specified circumstances: Secondary | ICD-10-CM | POA: Diagnosis not present

## 2022-07-13 DIAGNOSIS — W19XXXA Unspecified fall, initial encounter: Secondary | ICD-10-CM | POA: Diagnosis not present

## 2022-07-13 DIAGNOSIS — E876 Hypokalemia: Secondary | ICD-10-CM | POA: Diagnosis not present

## 2022-07-13 LAB — CBC
HCT: 40.2 % (ref 39.0–52.0)
Hemoglobin: 12.5 g/dL — ABNORMAL LOW (ref 13.0–17.0)
MCH: 28.7 pg (ref 26.0–34.0)
MCHC: 31.1 g/dL (ref 30.0–36.0)
MCV: 92.4 fL (ref 80.0–100.0)
Platelets: 405 10*3/uL — ABNORMAL HIGH (ref 150–400)
RBC: 4.35 MIL/uL (ref 4.22–5.81)
RDW: 13.2 % (ref 11.5–15.5)
WBC: 8.3 10*3/uL (ref 4.0–10.5)
nRBC: 0 % (ref 0.0–0.2)

## 2022-07-13 LAB — COMPREHENSIVE METABOLIC PANEL
ALT: 15 U/L (ref 0–44)
AST: 18 U/L (ref 15–41)
Albumin: 3.2 g/dL — ABNORMAL LOW (ref 3.5–5.0)
Alkaline Phosphatase: 71 U/L (ref 38–126)
Anion gap: 9 (ref 5–15)
BUN: 38 mg/dL — ABNORMAL HIGH (ref 8–23)
CO2: 24 mmol/L (ref 22–32)
Calcium: 9.2 mg/dL (ref 8.9–10.3)
Chloride: 109 mmol/L (ref 98–111)
Creatinine, Ser: 1.17 mg/dL (ref 0.61–1.24)
GFR, Estimated: 57 mL/min — ABNORMAL LOW (ref >60)
Glucose, Bld: 100 mg/dL — ABNORMAL HIGH (ref 70–99)
Potassium: 4.7 mmol/L (ref 3.5–5.1)
Sodium: 142 mmol/L (ref 135–145)
Total Bilirubin: 0.4 mg/dL (ref 0.3–1.2)
Total Protein: 6.7 g/dL (ref 6.5–8.1)

## 2022-07-13 LAB — GLUCOSE, CAPILLARY
Glucose-Capillary: 101 mg/dL — ABNORMAL HIGH (ref 70–99)
Glucose-Capillary: 127 mg/dL — ABNORMAL HIGH (ref 70–99)
Glucose-Capillary: 114 mg/dL — ABNORMAL HIGH (ref 70–99)

## 2022-07-13 NOTE — TOC Progression Note (Signed)
Transition of Care [facility]) - Progression Note    Patient Details  Name: Jordan [name] MRN: [id number] Date of Birth: 09/29/1922  Transition of Care [facility]) CM/SW [place], LCSW Phone Number: 07/13/2022, 3:33 PM  Clinical Narrative:    Spoke with pt's daughter and reviewed bed offer for SNF. Pt's daughter is going to review facility and will provide CSW with decision tomorrow. CSW shared if she does not accept this bed offer pt will most likely have to DC home with home health services as there are no other bed offers.    Expected Discharge Plan: [place] Barriers to Discharge: SNF Pending bed offer  Expected Discharge Plan and Services In-house Referral: Clinical Social Work Discharge Planning Services: CM Consult Post Acute Care Choice: [place] Living arrangements for the past 2 months: [place], [place]                 DME Arranged: [name] DME Agency: NA                   Social Determinants of Health (SDOH) Interventions [place]: No Food Insecurity (07/09/2022)  Housing: Low Risk  (07/09/2022)  Transportation Needs: No Transportation Needs (07/09/2022)  Utilities: Not At Risk (07/09/2022)  Tobacco Use: Medium Risk (07/08/2022)    Readmission Risk Interventions    07/11/2022   12:11 PM  Readmission Risk Prevention Plan  Transportation Screening Complete  PCP or Specialist Appt within 3-5 Days Complete  HRI or [place] Complete  Social Work Consult for [place] Planning/Counseling Complete  Palliative Care Screening Not Applicable  Medication Review Press photographer) Complete

## 2022-07-13 NOTE — TOC Progression Note (Addendum)
Transition of Care [facility]) - Progression Note    Patient Details  Name: Jordan [name] MRN: [id number] Date of Birth: 1923/05/20  Transition of Care [facility]) CM/SW [place], LCSW Phone Number: 07/13/2022, 12:47 PM  Clinical Narrative:    Pt continues to have no bed offers for SNF. CSW has contacted the following facilities to review pt for placement:   [facility] Considering Need clinical review  [facility]  Considering Need clinical review  [facility]  Considering Need clinical review  [facility]  Considering Need clinical review  [facility] [place] Preferred SNF  Considering Need clinical review    Referrals faxed to additional facilities for review.   Expected Discharge Plan: [place] Barriers to Discharge: SNF Pending bed offer  Expected Discharge Plan and Services In-house Referral: Clinical Social Work Discharge Planning Services: CM Consult Post Acute Care Choice: [place] Living arrangements for the past 2 months: [place], [place]                 DME Arranged: [name] DME Agency: NA                   Social Determinants of Health (SDOH) Interventions [place]: No Food Insecurity (07/09/2022)  Housing: Low Risk  (07/09/2022)  Transportation Needs: No Transportation Needs (07/09/2022)  Utilities: Not At Risk (07/09/2022)  Tobacco Use: Medium Risk (07/08/2022)    Readmission Risk Interventions    07/11/2022   12:11 PM  Readmission Risk Prevention Plan  Transportation Screening Complete  PCP or Specialist Appt within 3-5 Days Complete  HRI or [place] Complete  Social Work Consult for [place] Planning/Counseling Complete  Palliative Care Screening Not Applicable  Medication Review Press photographer) Complete

## 2022-07-13 NOTE — Progress Notes (Signed)
Physical Therapy Treatment Patient Details Name: Jordan [name] MRN: [id number] DOB: 1923/04/24 Today's Date: 07/13/2022   History of Present Illness This is a 88 year old male with dementia, CAD, diabetes mellitus, severe aortic stenosis and BPH.  The patient was sent to the ER from the skilled nursing facility due to increasing confusion.  The patient had a recent admission to the hospital from 12/5 through 12/21 for 4 acute metabolic encephalopathy.  It was felt that he had dementia with behavioral disturbances, medications were changed and he was eventually discharged to a skilled nursing facility. He returns only 2 days later with recurrent confusion and agitation and a fall. Patient admitted for hypokalemia.    PT Comments    Cognition/alertness improving.  General Comments: AxO x 1 pleasant.  More engaging.  Following repeat functional commands.  Repeats, "I can't believe how weak I am".  "call my family and let them know where I am", but pt unable to recall he is in the hospital. General bed mobility comments: pt found incont BM in bed.  Required increased asisst and use of bed pad to complete scooting to EOB. General transfer comment: severe posterior lean with NO self correction.  Max Assist to rise from elevated as well as toilet transfer.  Pt c/o "weak legs" and "I'm falling over".  Required Total Assist for hygiene.  VERY unsteady with turns and back steps.  Near fall x 2 in bathroom.  Using safety belt.  Poor vision.General Gait Details: Required Max Asisst for upright balance and recliner following for safety.  Poor kyphotic posture and narrow BOS.  B hips and knees flexed.  Poor balance with NO self correction to midline.  HIGH FALL RISK. Pt will need ST Rehab at SNF to address mobility and functional decline prior to safely returning home.   Recommendations for follow up therapy are one component of a multi-disciplinary discharge planning process, led by the attending  physician.  Recommendations may be updated based on patient status, additional functional criteria and insurance authorization.  Follow Up Recommendations  Skilled nursing-short term rehab (<3 hours/day) Can patient physically be transported by private vehicle: No   Assistance Recommended at Discharge Frequent or constant Supervision/Assistance  Patient can return home with the following A lot of help with bathing/dressing/bathroom;Assist for transportation;Assistance with feeding;Direct supervision/assist for financial management;Direct supervision/assist for medications management;Assistance with cooking/housework;A little help with walking and/or transfers   Equipment Recommendations  None recommended by PT    Recommendations for Other Services       Precautions / Restrictions Precautions Precautions: Fall Precaution Comments: incontinent, legally blind (can see shapes/shadows) Restrictions Weight Bearing Restrictions: No     Mobility  Bed Mobility Overal bed mobility: Needs Assistance Bed Mobility: Supine to Sit     Supine to sit: Mod assist, Max assist     General bed mobility comments: pt found incont BM in bed.  Required increased asisst and use of bed pad to complete scooting to EOB.    Transfers Overall transfer level: Needs assistance Equipment used: Rolling walker (2 wheels) Transfers: Sit to/from Stand Sit to Stand: Max assist           General transfer comment: severe posterior lean with NO self correction.  Max Assist to rise from elevated as well as toilet transfer.  Pt c/o "weak legs" and "I'm falling over".  Required Total Assist for hygiene.  VERY unsteady with turns and back steps.  Near fall x 2 in bathroom.  Using safety belt.  Poor vision.    Ambulation/Gait Ambulation/Gait assistance: Mod assist, Max assist Gait Distance (Feet): 36 Feet Assistive device: Rolling walker (2 wheels) Gait Pattern/deviations: Step-through pattern, Decreased stride  length, Trunk flexed Gait velocity: decreased     General Gait Details: Required Max Asisst for upright balance and recliner following for safety.  Poor kyphotic posture and narrow BOS.  B hips and knees flexed.  Poor balance with NO self correction to midline.  HIGH FALL RISK.   Stairs             Wheelchair Mobility    Modified Rankin (Stroke Patients Only)       Balance                                            Cognition Arousal/Alertness: Awake/alert Behavior During Therapy: WFL for tasks assessed/performed Overall Cognitive Status: No family/caregiver present to determine baseline cognitive functioning                                 General Comments: AxO x 1 pleasant.  More engaging.  Following repeat functional commands.  Repeats, "I can't believe how weak I am".  "call my family and let them know where I am", but pt unable to recall he is in the hospital.        Exercises      General Comments        Pertinent Vitals/Pain Pain Assessment Pain Assessment: No/denies pain    Home Living                          Prior Function            PT Goals (current goals can now be found in the care plan section) Progress towards PT goals: Progressing toward goals    Frequency    Min 2X/week      PT Plan Current plan remains appropriate    Co-evaluation              AM-PAC PT "6 Clicks" Mobility   Outcome Measure  Help needed turning from your back to your side while in a flat bed without using bedrails?: A Lot Help needed moving from lying on your back to sitting on the side of a flat bed without using bedrails?: A Lot Help needed moving to and from a bed to a chair (including a wheelchair)?: A Lot Help needed standing up from a chair using your arms (e.g., wheelchair or bedside chair)?: A Lot Help needed to walk in hospital room?: A Lot Help needed climbing 3-5 steps with a railing? : Total 6  Click Score: 11    End of Session Equipment Utilized During Treatment: Gait belt Activity Tolerance: Patient limited by fatigue Patient left: in chair;with call bell/phone within reach;with chair alarm set Nurse Communication: Mobility status PT Visit Diagnosis: Other abnormalities of gait and mobility (R26.89);Muscle weakness (generalized) (M62.81);Difficulty in walking, not elsewhere classified (R26.2)     Time: 2458-0998 PT Time Calculation (min) (ACUTE ONLY): 31 min  Charges:  $Gait Training: 8-22 mins $Therapeutic Activity: 8-22 mins                     {Eneida Evers  PTA Acute  Rehabilitation [employer] M-F  [phone] Weekend pager [phone]

## 2022-07-13 NOTE — Progress Notes (Signed)
  Progress Note   Patient: Jordan [name] TDS:[id number] DOB: [birth year] DOA: 07/08/2022     5 DOS: the patient was seen and examined on 07/13/2022   Brief hospital course: 88 year old male with dementia, CAD, diabetes mellitus, severe aortic stenosis and BPH.  The patient was sent to the ER from the skilled nursing facility due to increasing confusion. The patient had a recent admission to the hospital from 12/5 through 12/21 for 4 acute metabolic encephalopathy.  It was felt that he had dementia with behavioral disturbances, medications were changed and he was eventually discharged to a skilled nursing facility.   He returns only 2 days later with recurrent confusion and agitation and a fall. He was noted to have a potassium of 2.6 and a WBC count of 12.9.  Bicarb noted to be 18 and BUN 33 with a creatinine of 0.84. UA was negative for an infection.  Head and neck CTs obtained in the ED did not show any abnormalities.  Assessment and Plan: Principal Problem:   Hypokalemia - K stable at 4.7 - Cont to follow lytes and replace as needed   Active Problems:   Dementia with behavioral disturbance ([place]) - He was discharged 2 days prior with Seroquel but apparently was having behavioral disturbances at the skilled nursing facility - Did not receive PRN antipsychotic or have sitter or restraint x 24hr -TOC working on dispo, documentation reviewed     Leukocytosis - has improved to normal     Type 2 diabetes mellitus with complication - on SSI with Novolog - cont Metformin -glycemic trends stable     Hypertension/  Chronic diastolic CHF (congestive heart failure)  Moderate to severe Ao stenosis - Metoprolol is being continued      H/o Lymphocytic colitis - cont Entocort      Subjective: Remains pleasantly confused. Asking to call family member to take him home  Physical Exam: Vitals:   07/12/22 1429 07/12/22 1959 07/13/22 0437 07/13/22 1345  BP: (!) 126/53 (!) 129/58 (!)  132/44 (!) 90/52  Pulse: 67 63 78 77  Resp: 18 18 20 16   Temp: 98.3 F (36.8 C) 98 F (36.7 C) 97.6 F (36.4 C) 97.8 F (36.6 C)  TempSrc:  Oral Oral Oral  SpO2: 100% 100% 100% 97%  Weight:      Height:       General exam: Conversant, in no acute distress Respiratory system: normal chest rise, clear, no audible wheezing Cardiovascular system: regular rhythm, s1-s2 Gastrointestinal system: Nondistended, nontender, pos BS Central nervous system: No seizures, no tremors Extremities: No cyanosis, no joint deformities Skin: No rashes, no pallor Psychiatry: Difficult to assess given mentation  Data Reviewed:  Labs reviewed: Na 142, K 4.7, Cr 1.17  Family Communication: Pt in room, family not at bedside  Disposition: Status is: Inpatient Remains inpatient appropriate because: Severity of illness  Planned Discharge Destination: Skilled nursing facility    Author: Marylu Lund, MD 07/13/2022 3:54 PM  For on call review [email or web address].

## 2022-07-14 DIAGNOSIS — E876 Hypokalemia: Secondary | ICD-10-CM | POA: Diagnosis not present

## 2022-07-14 DIAGNOSIS — W19XXXA Unspecified fall, initial encounter: Secondary | ICD-10-CM | POA: Diagnosis not present

## 2022-07-14 DIAGNOSIS — Z7689 Persons encountering health services in other specified circumstances: Secondary | ICD-10-CM | POA: Diagnosis not present

## 2022-07-14 LAB — GLUCOSE, CAPILLARY
Glucose-Capillary: 191 mg/dL — ABNORMAL HIGH (ref 70–99)
Glucose-Capillary: 120 mg/dL — ABNORMAL HIGH (ref 70–99)
Glucose-Capillary: 194 mg/dL — ABNORMAL HIGH (ref 70–99)
Glucose-Capillary: 137 mg/dL — ABNORMAL HIGH (ref 70–99)
Glucose-Capillary: 160 mg/dL — ABNORMAL HIGH (ref 70–99)

## 2022-07-14 NOTE — TOC Progression Note (Addendum)
Transition of Care [facility]) - Progression Note    Patient Details  Name: Elizah Mierzwa Abeln MRN: [id number] Date of Birth: [birth year]  Transition [facility]) CM/SW [place], LCSW Phone Number: 07/14/2022, 11:47 AM  Clinical Narrative:    Spoke with pt's daughter via t/c. She shares she does not want pt to go the [facility] for SNF. There are no other bed offers for this pt. Pt's spouse agreeable for pt to return home w/ home health at discharge. She does not feel she will be able to manage pt's care on her own and is also looking into obtaining 24/7 caregiver services. CSW provided information for HHA agencies that provide private caregivers.  HH PT/OT/RN/Aide/SW has been arranged with [facility]. HH orders will need to be placed prior to discharge.   Update 2:10pm - Pt's spouse requesting pt be discharged tomorrow and shares that she will provide transportation home at discharge.   Expected Discharge Plan: [place] Barriers to Discharge: SNF Pending bed offer  Expected Discharge Plan and Services In-house Referral: Clinical Social Work Discharge Planning Services: CM Consult Post Acute Care Choice: [place] Living arrangements for the past 2 months: [place], [place]                 DME Arranged: [name] DME Agency: NA                   Social Determinants of Health (SDOH) Interventions [place]: No Food Insecurity (07/09/2022)  Housing: Low Risk  (07/09/2022)  Transportation Needs: No Transportation Needs (07/09/2022)  Utilities: Not At Risk (07/09/2022)  Tobacco Use: Medium Risk (07/08/2022)    Readmission Risk Interventions    07/14/2022   11:46 AM 07/11/2022   12:11 PM  Readmission Risk Prevention Plan  Transportation Screening Complete Complete  PCP or Specialist Appt within 3-5 Days Complete Complete  HRI or Interior Complete Complete   Social Work Consult for [place] Planning/Counseling Complete Complete  Palliative Care Screening Not Applicable Not Applicable  Medication Review Press photographer) Complete Complete

## 2022-07-14 NOTE — Progress Notes (Signed)
  Progress Note   Patient: Jordan [name] [id number]:[id number] DOB: 1923-02-16 DOA: 07/08/2022     6 DOS: the patient was seen and examined on 07/14/2022   Brief hospital course: 88 year old male with dementia, CAD, diabetes mellitus, severe aortic stenosis and BPH.  The patient was sent to the ER from the skilled nursing facility due to increasing confusion. The patient had a recent admission to the hospital from 12/5 through 12/21 for 4 acute metabolic encephalopathy.  It was felt that he had dementia with behavioral disturbances, medications were changed and he was eventually discharged to a skilled nursing facility.   He returns only 2 days later with recurrent confusion and agitation and a fall. He was noted to have a potassium of 2.6 and a WBC count of 12.9.  Bicarb noted to be 18 and BUN 33 with a creatinine of 0.84. UA was negative for an infection.  Head and neck CTs obtained in the ED did not show any abnormalities.  Assessment and Plan: Principal Problem:   Hypokalemia - K now stable - Cont to follow lytes and replace as needed   Active Problems:   Dementia with behavioral disturbance ([place]) - He was discharged 2 days prior with Seroquel but apparently was having behavioral disturbances at the skilled nursing facility - Did not receive PRN antipsychotic or have sitter or restraint x 24hr -TOC had been working on dispo, now plan for home with maximum HH services once family can arrange 24/7 provider     Leukocytosis - has improved to normal     Type 2 diabetes mellitus with complication - on SSI with Novolog - cont Metformin -glycemic trends stable     Hypertension/  Chronic diastolic CHF (congestive heart failure)  Moderate to severe Ao stenosis - Metoprolol is being continued      H/o Lymphocytic colitis - cont Entocort      Subjective: Hoping to go home  Physical Exam: Vitals:   07/13/22 1345 07/13/22 2058 07/14/22 0439 07/14/22 1315  BP: (!) 90/52 (!) 153/69  (!) 148/62 (!) 122/49  Pulse: 77 79 73 77  Resp: 16 20 17 17   Temp: 97.8 F (36.6 C) 98.1 F (36.7 C) (!) 97.4 F (36.3 C) 98.5 F (36.9 C)  TempSrc: Oral Oral Oral Oral  SpO2: 97% 98% 100% 100%  Weight:      Height:       General exam: Awake, laying in bed, in nad Respiratory system: Normal respiratory effort, no wheezing Cardiovascular system: regular rate, s1, s2 Gastrointestinal system: Soft, nondistended, positive BS Central nervous system: CN2-12 grossly intact, strength intact Extremities: Perfused, no clubbing Skin: Normal skin turgor, no notable skin lesions seen Psychiatry: Mood normal // no visual hallucinations   Data Reviewed:  There are no new results to review at this time.  Family Communication: Pt in room, family not at bedside  Disposition: Status is: Inpatient Remains inpatient appropriate because: Severity of illness  Planned Discharge Destination: Home    Author: Marylu Lund, MD 07/14/2022 3:32 PM  For on call review [email or web address].

## 2022-07-15 DIAGNOSIS — Z7689 Persons encountering health services in other specified circumstances: Secondary | ICD-10-CM

## 2022-07-15 DIAGNOSIS — E876 Hypokalemia: Principal | ICD-10-CM

## 2022-07-15 DIAGNOSIS — W19XXXA Unspecified fall, initial encounter: Secondary | ICD-10-CM

## 2022-07-15 LAB — CREATININE, SERUM
Creatinine, Ser: 1.23 mg/dL (ref 0.61–1.24)
GFR, Estimated: 53 mL/min — ABNORMAL LOW (ref >60)

## 2022-07-15 LAB — GLUCOSE, CAPILLARY
Glucose-Capillary: 77 mg/dL (ref 70–99)
Glucose-Capillary: 93 mg/dL (ref 70–99)
Glucose-Capillary: 79 mg/dL (ref 70–99)
Glucose-Capillary: 89 mg/dL (ref 70–99)

## 2022-07-15 NOTE — TOC Initial Note (Signed)
Transition of Care [facility]) - Initial/Assessment Note    Patient Details  Name: Jordan [name] MRN: [id number] Date of Birth: December 27, 1922  [facility]) CM/SW Contact:    Henrietta Dine, RN Phone Number: 07/15/2022, 2:18 PM  Clinical Narrative:                 Notified by Dr Wyline Copas pt ready for d/c w/ [facility] services; contacted pt's dtr Jordan [name] [phone]); she says she is not ready for the pt to come home b/c she has just started looking for 24/7 caregivers, and she is sick w/ a sore throat; she would like to talk w/ the hospitalist; Dr Wyline Copas notified via secure chat; updated Alwyn Ren at [facility] on pt d/c status.  Expected Discharge Plan: Skilled Nursing Facility Barriers to Discharge: SNF Pending bed offer   Patient Goals and CMS Choice Patient states their goals for this hospitalization and ongoing recovery are:: For pt to progress to go home CMS Medicare.gov Compare Post Acute Care list provided to:: Patient Represenative (must comment) Choice offered to / list presented to : Adult [place] ownership interest in [facility].provided to:: Adult Children    Expected Discharge Plan and Services In-house Referral: Clinical Social Work Discharge Planning Services: CM Consult Post Acute Care Choice: [place] Living arrangements for the past 2 months: [place], [place]                 DME Arranged: [name] DME Agency: NA                  Prior Living Arrangements/Services Living arrangements for the past 2 months: [place], [place] Lives with:: Adult Children, Facility Resident Patient language and need for interpreter reviewed:: Yes Do you feel safe going back to the place where you live?: Yes      Need for Family Participation in Patient Care: Yes (Comment) Care giver support system in place?: Yes (comment) Current home services: DME Criminal Activity/Legal  Involvement Pertinent to Current Situation/Hospitalization: No - Comment as needed  Activities of Daily Living Home Assistive Devices/Equipment: Eyeglasses, Hearing aid, Environmental consultant (specify type) ADL Screening (condition at time of admission) Patient's cognitive ability adequate to safely complete daily activities?: No Is the patient deaf or have difficulty hearing?: Yes Does the patient have difficulty seeing, even when wearing glasses/contacts?: Yes (legally blind) Does the patient have difficulty concentrating, remembering, or making decisions?: Yes Patient able to express need for assistance with ADLs?: Yes Does the patient have difficulty dressing or bathing?: Yes Independently performs ADLs?: No Communication: Independent Dressing (OT): Dependent Is this a change from baseline?: Pre-admission baseline Grooming: Dependent Is this a change from baseline?: Change from baseline, expected to last >3 days Feeding: Needs assistance Is this a change from baseline?: Pre-admission baseline Bathing: Dependent Is this a change from baseline?: Pre-admission baseline Toileting: Dependent Is this a change from baseline?: Pre-admission baseline In/Out Bed: Dependent Is this a change from baseline?: Pre-admission baseline Walks in Home: Independent with device (comment) (walker) Does the patient have difficulty walking or climbing stairs?: Yes Weakness of Legs: Both Weakness of Arms/Hands: Both  Permission Sought/Granted Permission sought to share information with : Facility Sport and exercise psychologist, Family Supports Permission granted to share information with : Yes, Verbal Permission Granted  Share Information with NAME: Jordan [name]     Permission granted to share info w Relationship: Daughter  Permission granted to share info w Contact Information: [phone]  Emotional Assessment Appearance:: Appears stated age Attitude/Demeanor/Rapport: Unable to Assess Affect (typically observed):  Unable to Assess Orientation: : Oriented to Self Alcohol / Substance Use: Not Applicable Psych Involvement: No (comment)  Admission diagnosis:  Hypocalcemia [E83.51] Hypokalemia [E87.6] Fall, initial encounter [W19.XXXA] Encounter for social work intervention [Z76.89] Patient Active Problem List   Diagnosis Date Noted   Hypokalemia 07/08/2022   Leukocytosis 07/08/2022   Chronic diastolic CHF (congestive heart failure) ([place]) 07/08/2022   Acute encephalopathy 31/06/1623   Acute metabolic encephalopathy 46/95/0722   Confusion with nonfocal neurological examination 06/20/2022   Dementia with behavioral disturbance ([place]) 06/20/2022   Lymphocytic colitis 10/13/2019   Benign prostatic hyperplasia with urinary obstruction 08/20/2019   Nocturia 08/20/2019   Pancreatitis 06/13/2016   Type 2 diabetes mellitus with complication (HCC)    S/P drug eluting coronary stent placement    Hypertension    Hyperlipidemia    History of colonic polyps    Diverticulosis of colon without hemorrhage    Hemorrhoids 10/23/2014   Constipation 06/03/2012   Balance disorder/Fall at home 05/29/2012   PCP:  Asencion Noble, MD Pharmacy:   [facility], [address] [place] [address] Phone: [phone] Fax: [phone]     Social Determinants of Health ([place]) Social History: [place]: No Food Insecurity (07/09/2022)  Housing: Low Risk  (07/09/2022)  Transportation Needs: No Transportation Needs (07/09/2022)  Utilities: Not At Risk (07/09/2022)  Tobacco Use: Medium Risk (07/08/2022)   SDOH Interventions:     Readmission Risk Interventions    07/14/2022   11:46 AM 07/11/2022   12:11 PM  Readmission Risk Prevention Plan  Transportation Screening Complete Complete  PCP or Specialist Appt within 3-5 Days Complete Complete  HRI or Home Care Consult Complete Complete  Social Work Consult for [place] Planning/Counseling  Complete Complete  Palliative Care Screening Not Applicable Not Applicable  Medication Review Press photographer) Complete Complete

## 2022-07-15 NOTE — Progress Notes (Signed)
  Progress Note   Patient: Jordan [name] FOY:[id number] DOB: 04-Mar-1923 DOA: 07/08/2022     7 DOS: the patient was seen and examined on 07/15/2022   Brief hospital course: 88 year old male with dementia, CAD, diabetes mellitus, severe aortic stenosis and BPH.  The patient was sent to the ER from the skilled nursing facility due to increasing confusion. The patient had a recent admission to the hospital from 12/5 through 12/21 for 4 acute metabolic encephalopathy.  It was felt that he had dementia with behavioral disturbances, medications were changed and he was eventually discharged to a skilled nursing facility.   He returns only 2 days later with recurrent confusion and agitation and a fall. He was noted to have a potassium of 2.6 and a WBC count of 12.9.  Bicarb noted to be 18 and BUN 33 with a creatinine of 0.84. UA was negative for an infection.  Head and neck CTs obtained in the ED did not show any abnormalities.  Assessment and Plan: Principal Problem:   Hypokalemia - Most recent K stable - Cont to follow lytes and replace as needed   Active Problems:   Dementia with behavioral disturbance ([place]) - He was discharged 2 days prior with Seroquel but apparently was having behavioral disturbances at the skilled nursing facility - Did not receive PRN antipsychotic or have sitter or restraint x 24hr -TOC had been working on dispo, now plan for home with maximum HH services once family can arrange 24/7 provider, pending     Leukocytosis - has improved to normal     Type 2 diabetes mellitus with complication - on SSI with Novolog - cont Metformin -glycemic trends stable     Hypertension/  Chronic diastolic CHF (congestive heart failure)  Moderate to severe Ao stenosis - Metoprolol is being continued      H/o Lymphocytic colitis - cont Entocort      Subjective: Pt eager to go home  Physical Exam: Vitals:   07/14/22 1956 07/15/22 0528 07/15/22 1217 07/15/22 1219  BP:  (!) 127/48 (!) 159/74 (!) 157/52   Pulse: 78 73 67   Resp: 18 18 16    Temp: 98 F (36.7 C) (!) 97.3 F (36.3 C)  97.6 F (36.4 C)  TempSrc: Oral Oral  Oral  SpO2: 99% 100% 100%   Weight:      Height:       General exam: Conversant, in no acute distress Respiratory system: normal chest rise, clear, no audible wheezing Cardiovascular system: regular rhythm, s1-s2 Gastrointestinal system: Nondistended, nontender, pos BS Central nervous system: No seizures, no tremors Extremities: No cyanosis, no joint deformities Skin: No rashes, no pallor Psychiatry: Affect normal // no auditory hallucinations   Data Reviewed:  There are no new results to review at this time.  Family Communication: Pt in room, family over phone  Disposition: Status is: Inpatient Remains inpatient appropriate because: Severity of illness  Planned Discharge Destination: Home    Author: Marylu Lund, MD 07/15/2022 6:11 PM  For on call review [email or web address].

## 2022-07-15 NOTE — Progress Notes (Signed)
Unable to give AM meds, Pt drowsy, Received Haldol and Ativan overnight for agitation. Will re-attemptt

## 2022-07-16 DIAGNOSIS — Z7689 Persons encountering health services in other specified circumstances: Secondary | ICD-10-CM | POA: Diagnosis not present

## 2022-07-16 DIAGNOSIS — E876 Hypokalemia: Secondary | ICD-10-CM | POA: Diagnosis not present

## 2022-07-16 LAB — CBC
HCT: 40.5 % (ref 39.0–52.0)
Hemoglobin: 12.8 g/dL — ABNORMAL LOW (ref 13.0–17.0)
MCH: 28.8 pg (ref 26.0–34.0)
MCHC: 31.6 g/dL (ref 30.0–36.0)
MCV: 91 fL (ref 80.0–100.0)
Platelets: 437 10*3/uL — ABNORMAL HIGH (ref 150–400)
RBC: 4.45 MIL/uL (ref 4.22–5.81)
RDW: 13.1 % (ref 11.5–15.5)
WBC: 6.2 10*3/uL (ref 4.0–10.5)
nRBC: 0 % (ref 0.0–0.2)

## 2022-07-16 LAB — COMPREHENSIVE METABOLIC PANEL
ALT: 16 U/L (ref 0–44)
AST: 24 U/L (ref 15–41)
Albumin: 3.3 g/dL — ABNORMAL LOW (ref 3.5–5.0)
Alkaline Phosphatase: 79 U/L (ref 38–126)
Anion gap: 13 (ref 5–15)
BUN: 32 mg/dL — ABNORMAL HIGH (ref 8–23)
CO2: 22 mmol/L (ref 22–32)
Calcium: 9 mg/dL (ref 8.9–10.3)
Chloride: 105 mmol/L (ref 98–111)
Creatinine, Ser: 1.23 mg/dL (ref 0.61–1.24)
GFR, Estimated: 53 mL/min — ABNORMAL LOW (ref >60)
Glucose, Bld: 130 mg/dL — ABNORMAL HIGH (ref 70–99)
Potassium: 3.9 mmol/L (ref 3.5–5.1)
Sodium: 140 mmol/L (ref 135–145)
Total Bilirubin: 0.6 mg/dL (ref 0.3–1.2)
Total Protein: 6.9 g/dL (ref 6.5–8.1)

## 2022-07-16 LAB — GLUCOSE, CAPILLARY
Glucose-Capillary: 120 mg/dL — ABNORMAL HIGH (ref 70–99)
Glucose-Capillary: 204 mg/dL — ABNORMAL HIGH (ref 70–99)
Glucose-Capillary: 78 mg/dL (ref 70–99)
Glucose-Capillary: 150 mg/dL — ABNORMAL HIGH (ref 70–99)

## 2022-07-16 MED ORDER — LACTATED RINGERS IV SOLN: INTRAVENOUS | Status: AC

## 2022-07-16 NOTE — Progress Notes (Signed)
  Progress Note   Patient: Jordan [name] NOT:[id number] DOB: 27-Jan-1923 DOA: 07/08/2022     8 DOS: the patient was seen and examined on 07/16/2022   Brief hospital course: 88 year old male with dementia, CAD, diabetes mellitus, severe aortic stenosis and BPH.  The patient was sent to the ER from the skilled nursing facility due to increasing confusion. The patient had a recent admission to the hospital from 12/5 through 12/21 for 4 acute metabolic encephalopathy.  It was felt that he had dementia with behavioral disturbances, medications were changed and he was eventually discharged to a skilled nursing facility.   He returns only 2 days later with recurrent confusion and agitation and a fall. He was noted to have a potassium of 2.6 and a WBC count of 12.9.  Bicarb noted to be 18 and BUN 33 with a creatinine of 0.84. UA was negative for an infection.  Head and neck CTs obtained in the ED did not show any abnormalities.  Assessment and Plan: Principal Problem:   Hypokalemia - Most recent K stable at 3.9 - Cont to follow lytes and replace as needed   Active Problems:   Dementia with behavioral disturbance ([place]) - He was discharged 2 days prior with Seroquel but apparently was having behavioral disturbances at the skilled nursing facility - Did not receive PRN antipsychotic or have sitter or restraint x 24hr -TOC had been working on dispo, now plan for home with maximum [facility] services once family can arrange 24/7 provider. Family is in the process of trying to arrange provider     Leukocytosis - has improved to normal     Type 2 diabetes mellitus with complication - on SSI with Novolog - cont Metformin -glycemic trends stable     Hypertension/  Chronic diastolic CHF (congestive heart failure)  Moderate to severe Ao stenosis - Metoprolol is being continued      H/o Lymphocytic colitis - cont Entocort  Dehydration -Membranes appear dry today, skin turgor decreased. -Cr is a  little higher at 1.23 -Staff reports pt eating around 20% of meals -Will give limited course of IVF hydration for now and recheck bmet in AM      Subjective: Tired this AM  Physical Exam: Vitals:   07/15/22 1219 07/15/22 1932 07/16/22 0453 07/16/22 1245  BP:  (!) 138/52 139/62 111/61  Pulse:  69 74 84  Resp:  16 16 18   Temp: 97.6 F (36.4 C) 97.9 F (36.6 C) (!) 97.5 F (36.4 C) 97.8 F (36.6 C)  TempSrc: Oral Oral Oral Oral  SpO2:  94% 100% 96%  Weight:      Height:       General exam: asleep, laying in bed, in nad, dry mucus membranes Respiratory system: Normal respiratory effort, no wheezing Cardiovascular system: regular rate, s1, s2 Gastrointestinal system: Soft, nondistended, positive BS Central nervous system: CN2-12 grossly intact, strength intact Extremities: Perfused, no clubbing Skin: decreased skin turgor, no notable skin lesions seen Psychiatry: difficult to assess given mentation  Data Reviewed:  There are no new results to review at this time.  Family Communication: Pt in room, family not at bedside  Disposition: Status is: Inpatient Remains inpatient appropriate because: Severity of illness  Planned Discharge Destination: Home    Author: Marylu Lund, MD 07/16/2022 4:33 PM  For on call review [email or web address].

## 2022-07-16 NOTE — Progress Notes (Signed)
       Overnight   NAME: Jordan [name] MRN: [id number] DOB : 1922/09/07    Date of Service   07/16/2022   HPI/Events of Note   Notified by RN for confusion agitation as noted previously. Nursing staff unable to redirect . Multiple attempts to exit bed and disregard Nursing attempts to keep him in bed.  [place] in use. Posey belt continued.   Interventions/ Plan   Restraint belt renewed Reassessment ongoing.       Gershon Cull BSN MSNA MSN ACNPC-AG Acute Care Nurse Practitioner [place]

## 2022-07-16 NOTE — Plan of Care (Signed)

## 2022-07-16 NOTE — Progress Notes (Signed)
       CROSS COVER NOTE  NAME: Jordan [name] MRN: [id number] DOB : 1923-04-23    Date of Service   07/16/2022   HPI/Events of Note   0630-contacted by RN with concern for patient's safety.  Per bedside RN, patient woke up confused and agitated.  Nursing staff has been unable to redirect him.  Patient is not following simple commands.  He attempts multiple times to exit bed unsafely.  RN states that multiple staff members were needed to keep the patient in bed.  Patient is a high fall risk.  Safety sitter not available at this time.  Posey belt restraint ordered.  Okay to use safety meds if necessary.   Interventions/ Plan   Restraint in use RN to reassess need for restraints this morning.     Raenette Rover, DNP, [place]

## 2022-07-17 DIAGNOSIS — Z7689 Persons encountering health services in other specified circumstances: Secondary | ICD-10-CM | POA: Diagnosis not present

## 2022-07-17 DIAGNOSIS — W19XXXA Unspecified fall, initial encounter: Secondary | ICD-10-CM | POA: Diagnosis not present

## 2022-07-17 DIAGNOSIS — E876 Hypokalemia: Secondary | ICD-10-CM | POA: Diagnosis not present

## 2022-07-17 LAB — COMPREHENSIVE METABOLIC PANEL
ALT: 14 U/L (ref 0–44)
AST: 19 U/L (ref 15–41)
Albumin: 2.9 g/dL — ABNORMAL LOW (ref 3.5–5.0)
Alkaline Phosphatase: 67 U/L (ref 38–126)
Anion gap: 8 (ref 5–15)
BUN: 31 mg/dL — ABNORMAL HIGH (ref 8–23)
CO2: 25 mmol/L (ref 22–32)
Calcium: 8.5 mg/dL — ABNORMAL LOW (ref 8.9–10.3)
Chloride: 106 mmol/L (ref 98–111)
Creatinine, Ser: 1.09 mg/dL (ref 0.61–1.24)
GFR, Estimated: >60 mL/min (ref >60)
Glucose, Bld: 106 mg/dL — ABNORMAL HIGH (ref 70–99)
Potassium: 3.7 mmol/L (ref 3.5–5.1)
Sodium: 139 mmol/L (ref 135–145)
Total Bilirubin: 0.5 mg/dL (ref 0.3–1.2)
Total Protein: 6 g/dL — ABNORMAL LOW (ref 6.5–8.1)

## 2022-07-17 LAB — GLUCOSE, CAPILLARY
Glucose-Capillary: 90 mg/dL (ref 70–99)
Glucose-Capillary: 123 mg/dL — ABNORMAL HIGH (ref 70–99)
Glucose-Capillary: 135 mg/dL — ABNORMAL HIGH (ref 70–99)
Glucose-Capillary: 170 mg/dL — ABNORMAL HIGH (ref 70–99)

## 2022-07-17 NOTE — Plan of Care (Signed)

## 2022-07-17 NOTE — Progress Notes (Signed)
Physical Therapy Treatment Patient Details Name: Jordan [name] MRN: [id number] DOB: [birth year] Today's Date: 07/17/2022   History of Present Illness This is a 89 year old male with dementia, CAD, diabetes mellitus, severe aortic stenosis and BPH.  The patient was sent to the ER from the skilled nursing facility due to increasing confusion.  The patient had a recent admission to the hospital from 12/5 through 12/21 for 4 acute metabolic encephalopathy.  It was felt that he had dementia with behavioral disturbances, medications were changed and he was eventually discharged to a skilled nursing facility. He returns only 2 days later with recurrent confusion and agitation and a fall. Patient admitted for hypokalemia 07/08/22.    PT Comments    Pt making gradual progress but remains very unsteady requiring mod-max A for balance with ambulation due to posterior lean and narrow base of support.  Pt pleasantly confused but able to follow basic commands. Continue to progress as able.     Recommendations for follow up therapy are one component of a multi-disciplinary discharge planning process, led by the attending physician.  Recommendations may be updated based on patient status, additional functional criteria and insurance authorization.  Follow Up Recommendations  Skilled nursing-short term rehab (<3 hours/day) Can patient physically be transported by private vehicle: No   Assistance Recommended at Discharge Frequent or constant Supervision/Assistance  Patient can return home with the following A lot of help with bathing/dressing/bathroom;Assist for transportation;Assistance with feeding;Direct supervision/assist for financial management;Direct supervision/assist for medications management;Assistance with cooking/housework;A lot of help with walking and/or transfers   Equipment Recommendations  None recommended by PT    Recommendations for Other Services       Precautions / Restrictions  Precautions Precautions: Fall Precaution Comments: incontinent, legally blind (can see shapes/shadows)     Mobility  Bed Mobility Overal bed mobility: Needs Assistance Bed Mobility: Rolling, Supine to Sit, Sit to Supine Rolling: Supervision   Supine to sit: Mod assist Sit to supine: Min assist   General bed mobility comments: Rolling both ways x 2 for adls with cues only.  Supine to sit with mod A for trunk and then min A for legs with return to bed.    Transfers Overall transfer level: Needs assistance Equipment used: Rolling walker (2 wheels) Transfers: Sit to/from Stand Sit to Stand: Mod assist           General transfer comment: Sit to stand x 3 during session with mod A for balance    Ambulation/Gait Ambulation/Gait assistance: Mod assist, Max assist Gait Distance (Feet): 70 Feet Assistive device: Rolling walker (2 wheels) Gait Pattern/deviations: Step-to pattern, Decreased stride length, Scissoring, Narrow base of support Gait velocity: decreased     General Gait Details: Pt with posterior lean and frequent scissoring gait requiring assist for RW and mod A for balance.  Pt with several loss of balance requiring assist to prevent falls.  With turns requing max A for balance and assist with RW. Attempted to cue for increased base of support but limited carry over   Stairs             Wheelchair Mobility    Modified Rankin (Stroke Patients Only)       Balance Overall balance assessment: Needs assistance Sitting-balance support: Feet supported, No upper extremity supported Sitting balance-Leahy Scale: Fair     Standing balance support: Bilateral upper extremity supported Standing balance-Leahy Scale: Poor Standing balance comment: RW and mod A with posterior lean  Cognition Arousal/Alertness: Awake/alert Behavior During Therapy: WFL for tasks assessed/performed Overall Cognitive Status: No family/caregiver  present to determine baseline cognitive functioning                                 General Comments: AxO x 1 and pleasant.  Followed basic commands        Exercises      General Comments        Pertinent Vitals/Pain Pain Assessment Pain Assessment: No/denies pain    Home Living                          Prior Function            PT Goals (current goals can now be found in the care plan section) Progress towards PT goals: Progressing toward goals    Frequency    Min 2X/week      PT Plan Current plan remains appropriate    Co-evaluation              AM-PAC PT "6 Clicks" Mobility   Outcome Measure  Help needed turning from your back to your side while in a flat bed without using bedrails?: A Little Help needed moving from lying on your back to sitting on the side of a flat bed without using bedrails?: A Lot Help needed moving to and from a bed to a chair (including a wheelchair)?: A Lot Help needed standing up from a chair using your arms (e.g., wheelchair or bedside chair)?: A Lot Help needed to walk in hospital room?: A Lot Help needed climbing 3-5 steps with a railing? : Total 6 Click Score: 12    End of Session Equipment Utilized During Treatment: Gait belt Activity Tolerance: Patient tolerated treatment well Patient left: in bed;with call bell/phone within reach;with bed alarm set;with restraints reapplied Nurse Communication: Mobility status PT Visit Diagnosis: Other abnormalities of gait and mobility (R26.89);Muscle weakness (generalized) (M62.81);Difficulty in walking, not elsewhere classified (R26.2)     Time: 5009-3818 PT Time Calculation (min) (ACUTE ONLY): 25 min  Charges:  $Gait Training: 8-22 mins $Therapeutic Activity: 8-22 mins                     Abran Richard, PT Acute Rehab [facility] [phone]    Jordan [name] 07/17/2022, 2:47 PM

## 2022-07-17 NOTE — Care Management Important Message (Signed)
Important Message  Patient Details IM Letter given. Name: Jordan [name] MRN: [id number] Date of Birth: April 05, 1923   Medicare Important Message Given:  Yes     Kerin Salen 07/17/2022, 11:08 AM

## 2022-07-17 NOTE — Progress Notes (Signed)
  Progress Note   Patient: Jordan [name] FBP:[id number] DOB: 1923-06-24 DOA: 07/08/2022     9 DOS: the patient was seen and examined on 07/17/2022   Brief hospital course: 89 year old male with dementia, CAD, diabetes mellitus, severe aortic stenosis and BPH.  The patient was sent to the ER from the skilled nursing facility due to increasing confusion. The patient had a recent admission to the hospital from 12/5 through 12/21 for 4 acute metabolic encephalopathy.  It was felt that he had dementia with behavioral disturbances, medications were changed and he was eventually discharged to a skilled nursing facility.   He returns only 2 days later with recurrent confusion and agitation and a fall. He was noted to have a potassium of 2.6 and a WBC count of 12.9.  Bicarb noted to be 18 and BUN 33 with a creatinine of 0.84. UA was negative for an infection.  Head and neck CTs obtained in the ED did not show any abnormalities.  Assessment and Plan: Principal Problem:   Hypokalemia - Most recent K stable at 3.7 - Cont to follow lytes and replace as needed   Active Problems:   Dementia with behavioral disturbance ([place]) - He was discharged 2 days prior with Seroquel but apparently was having behavioral disturbances at the skilled nursing facility -[facility] had been working on dispo, now plan for home with maximum [facility] services once family can arrange 24/7 provider. Family is in the process of trying to arrange provider -Of note, pt noted by staff to have issues with day/night reversal which may worsen confusion -Pt already on 75mg  seroquel QHS. Has been needing restraints PRN. On haldol PRN     Leukocytosis - has improved to normal     Type 2 diabetes mellitus with complication - on SSI with Novolog - cont Metformin -glycemic trends stable     Hypertension/  Chronic diastolic CHF (congestive heart failure)  Moderate to severe Ao stenosis - Metoprolol is being continued      H/o Lymphocytic  colitis - cont Entocort  Dehydration -Membranes recently appeared dry, concerns for dehydration with Cr a little higher to 1.23 -Cr improved to 1.09 after receiving IVF overnight.  -Pt observed being fed lunch this afternoon by staff      Subjective: Difficult to assess, seems tired this afternoon  Physical Exam: Vitals:   07/16/22 1245 07/16/22 1919 07/17/22 0500 07/17/22 1325  BP: 111/61 (!) 134/54 (!) 154/66 (!) 119/50  Pulse: 84 73 73 64  Resp: 18 16 18 12   Temp: 97.8 F (36.6 C) 98.1 F (36.7 C) 97.9 F (36.6 C) 98.1 F (36.7 C)  TempSrc: Oral Oral Oral Oral  SpO2: 96% 100% 100% 99%  Weight:      Height:       General exam: Awake, laying in bed, in nad, being fed lunch Respiratory system: Normal respiratory effort, no wheezing Cardiovascular system: regular rate, s1, s2 Gastrointestinal system: Soft, nondistended, positive BS Central nervous system: CN2-12 grossly intact, strength intact Extremities: Perfused, no clubbing Skin: Normal skin turgor, no notable skin lesions seen Psychiatry: difficult to assess given mentation  Data Reviewed:  There are no new results to review at this time.  Family Communication: Pt in room, family not at bedside  Disposition: Status is: Inpatient Remains inpatient appropriate because: Severity of illness  Planned Discharge Destination: Home    Author: Marylu Lund, MD 07/17/2022 4:24 PM  For on call review [email or web address].

## 2022-07-18 DIAGNOSIS — Z7689 Persons encountering health services in other specified circumstances: Secondary | ICD-10-CM | POA: Diagnosis not present

## 2022-07-18 DIAGNOSIS — E876 Hypokalemia: Secondary | ICD-10-CM | POA: Diagnosis not present

## 2022-07-18 DIAGNOSIS — W19XXXA Unspecified fall, initial encounter: Secondary | ICD-10-CM | POA: Diagnosis not present

## 2022-07-18 LAB — COMPREHENSIVE METABOLIC PANEL
ALT: 15 U/L (ref 0–44)
AST: 19 U/L (ref 15–41)
Albumin: 2.9 g/dL — ABNORMAL LOW (ref 3.5–5.0)
Alkaline Phosphatase: 68 U/L (ref 38–126)
Anion gap: 8 (ref 5–15)
BUN: 25 mg/dL — ABNORMAL HIGH (ref 8–23)
CO2: 27 mmol/L (ref 22–32)
Calcium: 8.7 mg/dL — ABNORMAL LOW (ref 8.9–10.3)
Chloride: 104 mmol/L (ref 98–111)
Creatinine, Ser: 1.05 mg/dL (ref 0.61–1.24)
GFR, Estimated: >60 mL/min (ref >60)
Glucose, Bld: 99 mg/dL (ref 70–99)
Potassium: 3.9 mmol/L (ref 3.5–5.1)
Sodium: 139 mmol/L (ref 135–145)
Total Bilirubin: 0.5 mg/dL (ref 0.3–1.2)
Total Protein: 6.1 g/dL — ABNORMAL LOW (ref 6.5–8.1)

## 2022-07-18 LAB — GLUCOSE, CAPILLARY
Glucose-Capillary: 80 mg/dL (ref 70–99)
Glucose-Capillary: 117 mg/dL — ABNORMAL HIGH (ref 70–99)
Glucose-Capillary: 151 mg/dL — ABNORMAL HIGH (ref 70–99)
Glucose-Capillary: 119 mg/dL — ABNORMAL HIGH (ref 70–99)

## 2022-07-18 LAB — CBC
HCT: 36.5 % — ABNORMAL LOW (ref 39.0–52.0)
Hemoglobin: 11.6 g/dL — ABNORMAL LOW (ref 13.0–17.0)
MCH: 29.1 pg (ref 26.0–34.0)
MCHC: 31.8 g/dL (ref 30.0–36.0)
MCV: 91.7 fL (ref 80.0–100.0)
Platelets: 346 10*3/uL (ref 150–400)
RBC: 3.98 MIL/uL — ABNORMAL LOW (ref 4.22–5.81)
RDW: 12.9 % (ref 11.5–15.5)
WBC: 4.8 10*3/uL (ref 4.0–10.5)
nRBC: 0 % (ref 0.0–0.2)

## 2022-07-18 MED ORDER — SENNA 8.6 MG PO TABS: 1.0000 | ORAL_TABLET | Freq: Two times a day (BID) | ORAL | 0 refills | Status: DC

## 2022-07-18 MED ORDER — ENOXAPARIN SODIUM 40 MG/0.4ML IJ SOSY: 40.0000 mg | PREFILLED_SYRINGE | INTRAMUSCULAR | Status: DC

## 2022-07-18 NOTE — Discharge Summary (Signed)
Physician Discharge Summary   Patient: Jordan [name] MRN: [id number] DOB: 05/11/1923  Admit date:     07/08/2022  Discharge date: 07/18/22  Discharge Physician: Marylu Lund   PCP: Asencion Noble, MD   Recommendations at discharge:    Follow up with PCP in 1-2 weeks  Discharge Diagnoses: Principal Problem:   Hypokalemia Active Problems:   Dementia with behavioral disturbance (HCC)   Leukocytosis   Type 2 diabetes mellitus with complication (HCC)   Hypertension   Chronic diastolic CHF (congestive heart failure) ([place])   Lymphocytic colitis  Resolved Problems:   * No resolved hospital problems. *  Hospital Course: 89 year old male with dementia, CAD, diabetes mellitus, severe aortic stenosis and BPH.  The patient was sent to the ER from the skilled nursing facility due to increasing confusion. The patient had a recent admission to the hospital from 12/5 through 12/21 for 4 acute metabolic encephalopathy.  It was felt that he had dementia with behavioral disturbances, medications were changed and he was eventually discharged to a skilled nursing facility.   He returns only 2 days later with recurrent confusion and agitation and a fall. He was noted to have a potassium of 2.6 and a WBC count of 12.9.  Bicarb noted to be 18 and BUN 33 with a creatinine of 0.84. UA was negative for an infection.  Head and neck CTs obtained in the ED did not show any abnormalities.  Assessment and Plan: Principal Problem:   Hypokalemia - Normalized - Most recent K stable at 3.9   Active Problems:   Dementia with behavioral disturbance ([place]) - He was discharged 2 days prior with Seroquel but apparently was having behavioral disturbances at the skilled nursing facility -[facility] had been working on dispo, now plan for home with maximum [facility] services once family can arrange 24/7 provider.  -Of note, pt noted by staff to have issues with day/night reversal which may worsen confusion -Pt already on 75mg   seroquel QHS. Recommend continuing good sleep hygiene  -Pt does need help feeding     Leukocytosis - has improved to normal     Type 2 diabetes mellitus with complication - on SSI with Novolog - cont Metformin     Hypertension/  Chronic diastolic CHF (congestive heart failure)  Moderate to severe Ao stenosis - Metoprolol is being continued      H/o Lymphocytic colitis - cont Entocort   Dehydration -Membranes recently appeared dry, concerns for dehydration with Cr a little higher to 1.23 -Cr improved to 1.05 after receiving IVF -Pt would likely require help and assistance with meals       Consultants:  Procedures performed:   Disposition: Home Diet recommendation:  Regular diet DISCHARGE MEDICATION: Allergies as of 07/18/2022   No Known Allergies      Medication List     STOP taking these medications    silodosin 8 MG Caps capsule Commonly known as: RAPAFLO       TAKE these medications    aspirin 81 MG chewable tablet Chew 81 mg by mouth in the morning. (0800) What changed: Another medication with the same name was removed. Continue taking this medication, and follow the directions you see here.   budesonide 3 MG 24 hr capsule Commonly known as: ENTOCORT EC TAKE 1 CAPSULE BY MOUTH DAILY. What changed:  when to take this additional instructions   feeding supplement Liqd Take 237 mLs by mouth 3 (three) times daily after meals.   finasteride 5 MG tablet Commonly  known as: PROSCAR Take 1 tablet (5 mg total) by mouth daily. as directed What changed:  when to take this additional instructions   memantine 10 MG tablet Commonly known as: NAMENDA Take 10 mg by mouth 2 (two) times daily. (0800, 2000)   metFORMIN 500 MG tablet Commonly known as: GLUCOPHAGE Take 500 mg by mouth at bedtime. (2100)   metoprolol tartrate 25 MG tablet Commonly known as: LOPRESSOR Take 0.5 tablets (12.5 mg total) by mouth 2 (two) times daily. What changed: additional  instructions   mirabegron ER 25 MG Tb24 tablet Commonly known as: MYRBETRIQ Take 1 tablet (25 mg total) by mouth daily. What changed:  when to take this additional instructions   pravastatin 80 MG tablet Commonly known as: PRAVACHOL Take 80 mg by mouth daily at 6 PM.   PreserVision AREDS Tabs Take 1 tablet by mouth in the morning. (0800)   QUEtiapine 25 MG tablet Commonly known as: SEROQUEL Take 3 tablets (75 mg total) by mouth at bedtime. What changed: additional instructions   senna 8.6 MG Tabs tablet Commonly known as: SENOKOT Take 1 tablet (8.6 mg total) by mouth 2 (two) times daily.        Follow-up Information     [facility] Follow up.   Specialty: [place] Why: [facility] will follow up with you at discharge to provide home health services Contact information: [address] [place] [address] [phone]         Asencion Noble, MD Follow up in 1 week(s).   Specialty: Internal Medicine Why: Hospital follow up Contact information: [address] [place] [address] [phone]                Discharge Exam: Danley Danker Weights   07/09/22 0033  Weight: 55.7 kg   General exam: Awake, laying in bed, in nad Respiratory system: Normal respiratory effort, no wheezing Cardiovascular system: regular rate, s1, s2 Gastrointestinal system: Soft, nondistended, positive BS Central nervous system: CN2-12 grossly intact, strength intact Extremities: Perfused, no clubbing Skin: Normal skin turgor, no notable skin lesions seen Psychiatry: Mood normal // no visual hallucinations   Condition at discharge: fair  The results of significant diagnostics from this hospitalization (including imaging, microbiology, ancillary and laboratory) are listed below for reference.   Imaging Studies: DG Chest Portable 1 View  Result Date: 07/08/2022 CLINICAL DATA:  [place] 2 hours ago EXAM: PORTABLE CHEST 1 VIEW COMPARISON:   06/20/2022, 06/25/2022 FINDINGS: Single frontal view of the chest demonstrates an unremarkable cardiac silhouette. Stable atherosclerosis. No airspace disease, effusion, or pneumothorax. Persistent peripheral ground-glass nodularity left upper lobe overlying the lateral aspect second rib. Please see recent CT chest discussion. No acute bony abnormality. IMPRESSION: 1. No acute intrathoracic process. 2. Stable peripheral ground-glass nodularity within the left upper lobe, please refer to recent chest CT 06/25/2022 discussing likely underlying indolent adenocarcinoma. Electronically Signed   By: Randa Ngo M.D.   On: 07/08/2022 18:58   DG Pelvis Portable  Result Date: 07/08/2022 CLINICAL DATA:  [place] 2 hours ago EXAM: PORTABLE PELVIS 1-2 VIEWS COMPARISON:  None Available. FINDINGS: Single frontal view of the pelvis includes both hips. No acute displaced fracture. Symmetrical bilateral hip osteoarthritis. Prominent degenerative changes of the lumbosacral junction. Diffuse atherosclerosis. IMPRESSION: 1. No acute displaced fracture. Electronically Signed   By: Randa Ngo M.D.   On: 07/08/2022 18:56   CT HEAD WO CONTRAST (5MM)  Result Date: 07/08/2022 CLINICAL DATA:  Fall.  Head injury.  EXAM: CT HEAD WITHOUT CONTRAST CT CERVICAL SPINE WITHOUT CONTRAST TECHNIQUE: Multidetector CT imaging of the head and cervical spine was performed following the standard protocol without intravenous contrast. Multiplanar CT image reconstructions of the cervical spine were also generated. RADIATION DOSE REDUCTION: This exam was performed according to the departmental dose-optimization program which includes automated exposure control, adjustment of the mA and/or kV according to patient size and/or use of iterative reconstruction technique. COMPARISON:  06/22/2022. FINDINGS: CT HEAD FINDINGS Brain: No evidence of acute infarction, hemorrhage, hydrocephalus, extra-axial collection or mass lesion/mass effect. Vascular: No  hyperdense vessel or unexpected calcification. Skull: Normal. Negative for fracture or focal lesion. Sinuses/Orbits: Globes and orbits are unremarkable. Dependent secretions in the left sphenoid sinus. Remaining sinuses are clear. Other: None. CT CERVICAL SPINE FINDINGS Alignment: Mild kyphosis, apex at C5-C6.  No spondylolisthesis. Skull base and vertebrae: No acute fracture. No primary bone lesion or focal pathologic process. Soft tissues and spinal canal: No prevertebral fluid or swelling. No visible canal hematoma. Disc levels: Moderate loss of disc height at C4-C5, C5-C6 and C6-C7. Disc bulging and endplate spurring noted at these levels, greatest at C5-C6 and C6-C7. No convincing disc herniation. Facet degenerative changes, greatest at C2-C3 and C3-C4. Upper chest: No acute findings. Pleuroparenchymal scarring noted at the lung apices. Other: None. IMPRESSION: HEAD CT 1. No acute intracranial abnormalities. CERVICAL CT 1. No fracture or acute finding. Electronically Signed   By: Lajean Manes M.D.   On: 07/08/2022 18:47   CT Cervical Spine Wo Contrast  Result Date: 07/08/2022 CLINICAL DATA:  Fall.  Head injury. EXAM: CT HEAD WITHOUT CONTRAST CT CERVICAL SPINE WITHOUT CONTRAST TECHNIQUE: Multidetector CT imaging of the head and cervical spine was performed following the standard protocol without intravenous contrast. Multiplanar CT image reconstructions of the cervical spine were also generated. RADIATION DOSE REDUCTION: This exam was performed according to the departmental dose-optimization program which includes automated exposure control, adjustment of the mA and/or kV according to patient size and/or use of iterative reconstruction technique. COMPARISON:  06/22/2022. FINDINGS: CT HEAD FINDINGS Brain: No evidence of acute infarction, hemorrhage, hydrocephalus, extra-axial collection or mass lesion/mass effect. Vascular: No hyperdense vessel or unexpected calcification. Skull: Normal. Negative for  fracture or focal lesion. Sinuses/Orbits: Globes and orbits are unremarkable. Dependent secretions in the left sphenoid sinus. Remaining sinuses are clear. Other: None. CT CERVICAL SPINE FINDINGS Alignment: Mild kyphosis, apex at C5-C6.  No spondylolisthesis. Skull base and vertebrae: No acute fracture. No primary bone lesion or focal pathologic process. Soft tissues and spinal canal: No prevertebral fluid or swelling. No visible canal hematoma. Disc levels: Moderate loss of disc height at C4-C5, C5-C6 and C6-C7. Disc bulging and endplate spurring noted at these levels, greatest at C5-C6 and C6-C7. No convincing disc herniation. Facet degenerative changes, greatest at C2-C3 and C3-C4. Upper chest: No acute findings. Pleuroparenchymal scarring noted at the lung apices. Other: None. IMPRESSION: HEAD CT 1. No acute intracranial abnormalities. CERVICAL CT 1. No fracture or acute finding. Electronically Signed   By: Lajean Manes M.D.   On: 07/08/2022 18:47   Overnight EEG with video  Result Date: 06/26/2022 Samuella Cota, MD     06/26/2022  7:58 AM EEG Procedure CPT/Type of Study: 49702; 2-12hr EEG with video Referring Provider: Ghimire Primary Neurological Diagnosis: AMS History: This is a 89 yr old patient, undergoing an EEG to evaluate for AMS. Clinical State: disoriented Technical Description: The EEG was performed using standard setting per the guidelines of American Clinical Neurophysiology Society (  ACNS). A minimum of 21 electrodes were placed on scalp according to the International 10-20 or/and 10-10 Systems. Supplemental electrodes were placed as needed. Single EKG electrode was also used to detect cardiac arrhythmia. Patient's behavior was continuously recorded on video simultaneously with EEG. A minimum of 16 channels were used for data display. Each epoch of study was reviewed manually daily and as needed using standard referential and bipolar montages. Computerized quantitative EEG analysis (such as  compressed spectral array analysis, trending, automated spike & seizure detection) were used as indicated. Day 1: from 1324 06/25/22 to 1856 06/26/22 EEG Description: Overall Amplitude:Normal Predominant Frequency: The background activity showed theta/delta slowing occurring frequently Superimposed Frequencies: sparse beta activity bilaterally The background was symmetric Background Abnormalities: Generalized slowing as above Rhythmic or periodic pattern: No Epileptiform activity: no Electrographic seizures: no Events: no Breach rhythm: no Reactivity: Present Stimulation procedures: Hyperventilation: not done Photic stimulation: not done Sleep Background: Stage I EKG:no significant arrhythmia Impression: This was an abnormal continuous EEG due to diffuse background slowing, indicative of a non-specific encephalopathy pattern. No seizures or epileptiform discharges were seen.   CT CHEST ABDOMEN PELVIS W CONTRAST  Result Date: 06/25/2022 CLINICAL DATA:  Lung mass identified by CTA head and neck, evaluate for malignancy * Tracking Code: BO * EXAM: CT CHEST, ABDOMEN, AND PELVIS WITH CONTRAST TECHNIQUE: Multidetector CT imaging of the chest, abdomen and pelvis was performed following the standard protocol during bolus administration of intravenous contrast. RADIATION DOSE REDUCTION: This exam was performed according to the departmental dose-optimization program which includes automated exposure control, adjustment of the mA and/or kV according to patient size and/or use of iterative reconstruction technique. CONTRAST:  37mL OMNIPAQUE IOHEXOL 350 MG/ML SOLN COMPARISON:  CT chest angiogram, 06/13/2016, CT abdomen pelvis, 06/17/2016 FINDINGS: CT CHEST FINDINGS Cardiovascular: Aortic atherosclerosis. Aortic valve calcifications. Normal heart size. Three-vessel coronary artery calcifications. No pericardial effusion. Mediastinum/Nodes: No enlarged mediastinal, hilar, or axillary lymph nodes. Thyroid gland, trachea, and  esophagus demonstrate no significant findings. Lungs/Pleura: In comparison to most recent examination of the chest dated 06/13/2016, there has been significant interval increase in size and solid character of a previously seen subsolid opacity of the peripheral left upper lobe, now measuring 2.6 x 2.2 cm, previously 2.1 x 1.4 cm (series 5, image 37). Mild underlying centrilobular emphysema. Diffuse bilateral bronchial wall thickening. Small bilateral pleural effusions. No pleural effusion or pneumothorax. Musculoskeletal: No chest wall abnormality. No acute osseous findings. CT ABDOMEN PELVIS FINDINGS Hepatobiliary: No solid liver abnormality is seen. No gallstones, gallbladder wall thickening, or biliary dilatation. Pancreas: Unremarkable. No pancreatic ductal dilatation or surrounding inflammatory changes. Spleen: Normal in size without significant abnormality. Adrenals/Urinary Tract: Adrenal glands are unremarkable. Kidneys are normal, without renal calculi, solid lesion, or hydronephrosis. Bladder is unremarkable. Stomach/Bowel: Stomach is within normal limits. Diverticulum of the descending duodenum. Appendix appears normal. No evidence of bowel wall thickening, distention, or inflammatory changes. Pancolonic diverticulosis. Vascular/Lymphatic: Aortic atherosclerosis. Interval enlargement of an aneurysm of the infrarenal abdominal aorta measuring up to 4.4 x 3.8 cm, previously 3.8 x 3.7 cm (series 3, image 68). No enlarged abdominal or pelvic lymph nodes. Reproductive: Prostatomegaly. Other: Small, fat containing left inguinal hernia.  No ascites. Musculoskeletal: No acute osseous findings. IMPRESSION: 1. In comparison to most recent examination of the chest dated 06/13/2016, there has been significant interval increase in size and solid character of a previously seen subsolid opacity of the peripheral left upper lobe, now measuring 2.6 x 2.2 cm, previously 2.1 x 1.4 cm. This  is highly concerning for indolent  adenocarcinoma. PET-CT characterization and tissue sampling can be considered if clinically appropriate given advanced patient age. 2. No evidence of lymphadenopathy or metastatic disease in the chest, abdomen, or pelvis. 3. Mild emphysema and diffuse bilateral bronchial wall thickening. 4. Small bilateral pleural effusions. 5. Interval enlargement of an aneurysm of the infrarenal abdominal aorta measuring up to 4.4 x 3.8 cm, previously 3.8 x 3.7 cm. Recommend follow-up every 12 months and vascular consultation if clinically appropriate. This recommendation follows ACR consensus guidelines: White Paper of the ACR Incidental Findings Committee II on Vascular Findings. J Am Coll Radiol 2013; 10:789-794. 6. Prostatomegaly. 7. Coronary artery disease. Aortic Atherosclerosis (ICD10-I70.0) and Emphysema (ICD10-J43.9). Electronically Signed   By: Delanna Ahmadi M.D.   On: 06/25/2022 16:44   EEG adult  Result Date: 06/23/2022 Landry Corporal, MD     06/23/2022  3:32 PM [facility] Routine EEG Report Demographics: Patient Name:   [name] Date of Birth:   [birth year] Identification Number:   MRN - [id number] Study Times: Study Start Time:   06/23/2022 14:19:56 Study End Time:   06/23/2022 14:43:35 Duration:   23 minutes Indication(s): Encephalopathy Technical Summary: This EEG was performed utilizing standard International 10-20 System of electrode placement. Data were obtained and interpreted utilizing referential montage recording, with reformatting to longitudinal, transverse bipolar, and referential montages as necessary for interpretation. State(s):       Awake Activation Procedures: Hyperventilation: Not performed Photic Stimulation: Not performed EEG Description: No normal patterns of awake or sleep were present during this recording. Intermittent generalized theta-delta slowing was present. Excess continuous generalized theta-delta slowing was present. Minor  muscle, motion, electrode, and eye movement artifacts were occasionally noted. Impression: Abnormal EEG due to:   1) No normal patterns of awake or sleep  2) Intermittent generalized theta-delta slowing  3) Continuous generalized theta-delta slowing  Clinical Correlation: This EEG is suggestive of a moderate encephalopathy, but is nonspecific as to etiology. The absence of epileptiform abnormalities does not preclude a clinical diagnosis of seizures. [facility] For Inpatient follow-up with [facility] physician please call [facility] [phone]. This is not an outpatient service. Post hospital discharge, please contact hospital directly. Please do not communicate with [facility] physicians via secure chat. If you have any questions, Please contact [facility].  CT Head Wo Contrast  Result Date: 06/22/2022 CLINICAL DATA:  Altered mental status EXAM: CT HEAD WITHOUT CONTRAST TECHNIQUE: Contiguous axial images were obtained from the base of the skull through the vertex without intravenous contrast. RADIATION DOSE REDUCTION: This exam was performed according to the departmental dose-optimization program which includes automated exposure control, adjustment of the mA and/or kV according to patient size and/or use of iterative reconstruction technique. COMPARISON:  None Available. FINDINGS: Brain: No evidence of acute infarction, hemorrhage, hydrocephalus, extra-axial collection or mass lesion/mass effect. Advanced parenchymal volume loss with ex vacuo dilation of the ventricles again noted. Mild periventricular white matter changes are present likely reflecting the sequela of small vessel ischemia. Vascular: No hyperdense vessel or unexpected calcification. Skull: Normal. Negative for fracture or focal lesion. Sinuses/Orbits: No acute finding. Other: Mastoid air cells and middle ear cavities are clear. IMPRESSION: 1. No acute intracranial abnormality. 2. Advanced parenchymal volume loss with ex vacuo dilation of  the ventricles again noted. 3. Mild periventricular white matter changes likely reflecting the sequela of small vessel ischemia. Electronically Signed   By: Fidela Salisbury M.D.   On: 06/22/2022 01:04   MR BRAIN WO CONTRAST  Result Date: 06/20/2022 CLINICAL DATA:  Left-sided facial droop, generalized weakness, stroke suspected EXAM: MRI HEAD WITHOUT CONTRAST TECHNIQUE: Multiplanar, multiecho pulse sequences of the brain and surrounding structures were obtained without intravenous contrast. COMPARISON:  No prior MRI, correlation is made with CT head 06/20/2022 FINDINGS: Brain: No restricted diffusion to suggest acute or subacute infarct. No acute hemorrhage, mass, mass effect, or midline shift. No hydrocephalus or extra-axial collection. Redemonstrated advanced cerebral atrophy, with ex vacuo dilatation the ventricles. Vascular: Normal arterial flow voids. Skull and upper cervical spine: Evaluation is somewhat motion limited, but marrow signal is grossly normal. Sinuses/Orbits: Mild mucosal thickening in the ethmoid air cells. Other: The mastoids are well aerated. IMPRESSION: No acute intracranial process. No evidence of acute or subacute infarct. Electronically Signed   By: Merilyn Baba M.D.   On: 06/20/2022 16:39   EEG adult  Result Date: 06/20/2022 Landry Corporal, MD     06/20/2022  6:10 PM [facility] Routine EEG Report Demographics: Patient Name:   [name] Date of Birth:   1922/09/21 Identification Number:   MRN - [id number] Study Times: Duration:   24 minutes Indication(s): Encephalopathy Technical Summary: This EEG was performed utilizing standard International 10-20 System of electrode placement. Data were obtained, stored, and interpreted utilizing referential montage recording, with reformatting to longitudinal, transverse bipolar, and referential montages as necessary for interpretation. State(s):       Awake      Drowsy      Asleep  Activation Procedures: Hyperventilation: Not performed Photic Stimulation: Not performed EEG Description: No normal patterns of awake or sleep were present during this recording. Intermittent generalized theta-delta slowing was present. Excess continuous generalized theta-delta slowing was present. Occasional severe myogenic artifact precluded a more thorough interpretation of this recording. Impression: Abnormal EEG due to:   1) No normal patterns of awake or sleep  2) Intermittent generalized theta-delta slowing  3) Continuous generalized theta-delta slowing  Clinical Correlation: This EEG is suggestive of a moderate encephalopathy, but is nonspecific as to etiology. The absence of epileptiform abnormalities does not preclude a clinical diagnosis of seizures. Nicolette Bang) [place] Board of Psychiatry and Neurology (ABPN); American Board of Clinical Neurophysiology  (ABCN) [facility] For Inpatient follow-up with [facility] physician please call [facility] [phone]. This is not an outpatient service. Post hospital discharge, please contact hospital directly.   DG Chest Portable 1 View  Result Date: 06/20/2022 CLINICAL DATA:  Altered mental status. EXAM: PORTABLE CHEST 1 VIEW COMPARISON:  02/28/2021 FINDINGS: Midline trachea. Mild cardiomegaly. Atherosclerosis in the transverse aorta. No pleural effusion or pneumothorax. Nonspecific mild interstitial prominence could be related to remote smoking history. No lobar consolidation. IMPRESSION: No acute cardiopulmonary disease. Aortic Atherosclerosis (ICD10-I70.0). Electronically Signed   By: Abigail Miyamoto M.D.   On: 06/20/2022 12:29   CT ANGIO HEAD NECK W WO CM (CODE STROKE)  Result Date: 06/20/2022 CLINICAL DATA:  Left-sided facial droop. EXAM: CT ANGIOGRAPHY HEAD AND NECK TECHNIQUE: Multidetector CT imaging of the head and neck was performed using the standard protocol during bolus administration of intravenous contrast. Multiplanar CT  image reconstructions and MIPs were obtained to evaluate the vascular anatomy. Carotid stenosis measurements (when applicable) are obtained utilizing NASCET criteria, using the distal internal carotid diameter as the denominator. RADIATION DOSE REDUCTION: This exam was performed according to the departmental dose-optimization program which includes automated exposure control, adjustment of the mA and/or kV according to patient size and/or use of iterative reconstruction technique. CONTRAST:  56mL  OMNIPAQUE IOHEXOL 350 MG/ML SOLN COMPARISON:  Same day CT brain FINDINGS: CT HEAD FINDINGS See same day CT brain for additional findings. CTA NECK FINDINGS Aortic arch: Standard branching. Imaged portion shows no evidence of aneurysm or dissection. No significant stenosis of the major arch vessel origins. Right carotid system: Approximately 75% stenosis at the origin of the right ICA. Left carotid system: Mild stenosis of the origin of the left ICA. Vertebral arteries: Codominant. No evidence of dissection, stenosis (50% or greater), or occlusion. Skeleton: There is large degenerative pannus at C1-C2 that results in of the spinal canal. There is also a calcified disc extrusion at C5-C6 that results in mild-to-moderate spinal canal narrowing. Other neck: Negative. Upper chest: Peripheral subsolid pulmonary opacity has increased in size compared to 2017 and is suspicious for malignancy. Review of the MIP images confirms the above findings CTA HEAD FINDINGS Anterior circulation: Hyperdense focus seen on same day CT brain correlates with a calcified focal plaque of uncertain clinical significance. Posterior circulation: There is moderate to severe focal stenosis in the distal P1 and proximal P2 segments of the left PCA (series 5, image 218, 2-5). Venous sinuses: As permitted by contrast timing, patent. Anatomic variants: None Review of the MIP images confirms the above findings IMPRESSION: 1. No evidence of large vessel  occlusion. 2. Severe focal stenoses in the distal P1 and proximal P2 segments of the left PCA. 3. Approximately 75% stenosis at the origin of the right ICA. 4. Peripheral subsolid pulmonary opacity has increased in size compared to 2017 and is suspicious for malignancy. Recommend further evaluation with a dedicated chest CT. Electronically Signed   By: Marin Roberts M.D.   On: 06/20/2022 12:19   CT HEAD CODE STROKE WO CONTRAST  Result Date: 06/20/2022 CLINICAL DATA:  Code stroke. Neuro deficit, acute, stroke suspected. Left-sided facial droop. Generalized weakness. Altered mental status. EXAM: CT HEAD WITHOUT CONTRAST TECHNIQUE: Contiguous axial images were obtained from the base of the skull through the vertex without intravenous contrast. RADIATION DOSE REDUCTION: This exam was performed according to the departmental dose-optimization program which includes automated exposure control, adjustment of the mA and/or kV according to patient size and/or use of iterative reconstruction technique. COMPARISON:  Head CT 08/20/2009. FINDINGS: Brain: Moderate cerebral atrophy. Mild patchy and ill-defined hypoattenuation within the cerebral white matter, nonspecific but compatible with chronic small vessel ischemic disease. Redemonstrated chronic lacunar infarct within the right caudate nucleus. Redemonstrated small chronic lacunar infarct versus prominent perivascular space within the left lentiform nucleus. There is no acute intracranial hemorrhage. No demarcated cortical infarct. No extra-axial fluid collection. No evidence of an intracranial mass. No midline shift. Vascular: Atherosclerotic calcifications. Small calcified focus at the anterior aspect of the right sylvian fissure, new from the prior head CT head CT of 08/20/2009 and possibly reflecting calcified embolic plaque (series 2, image 16) (series 5, image 19). Skull: No fracture or aggressive osseous lesion. Sinuses/Orbits: No mass or acute finding within the  imaged orbits. Small volume frothy secretions within the left sphenoid sinus. Other: Small left forehead hematoma. Subcutaneous right forehead lipoma measuring 1.6 x 0.4 cm (series 2, image 18). ASPECTS ([place] Stroke Program Early CT Score) - Ganglionic level infarction (caudate, lentiform nuclei, internal capsule, insula, M1-M3 cortex): 7 - Supraganglionic infarction (M4-M6 cortex): 3 Total score (0-10 with 10 being normal): 10 Impressions #1 and #2 called by telephone at the time of interpretation on 06/20/2022 at 11:40 am to provider Davonna Belling , who verbally acknowledged these results. IMPRESSION: 1.  No evidence of acute intracranial hemorrhage or acute infarct. 2. Small calcified focus at the anterior aspect of the right sylvian fissure, new from the prior head CT of 08/20/2009 and possibly reflecting calcified embolic plaque. 3. Mild chronic small vessel ischemic changes within the cerebral white matter. 4. Redemonstrated small chronic lacunar infarct within the right caudate nucleus, and possible small chronic lacunar infarct within the left lentiform nucleus. 5. Moderate cerebral atrophy. 6. Small left forehead hematoma. 7. 1.6 cm subcutaneous right forehead lipoma. 8. Left sphenoid sinusitis. Electronically Signed   By: Kellie Simmering D.O.   On: 06/20/2022 11:42    Microbiology: Results for orders placed or performed during the hospital encounter of 06/21/22  Culture, blood (Routine X 2) w Reflex to ID Panel     Status: Abnormal   Collection Time: 06/22/22  5:03 PM   Specimen: Left Antecubital; Blood  Result Value Ref Range Status   Specimen Description   Final    LEFT ANTECUBITAL Performed at [facility], [address] [place], [place] [address]    Special Requests   Final    BOTTLES DRAWN AEROBIC AND ANAEROBIC Blood Culture adequate volume Performed at [facility], [address] [place], [place] [address]    Culture  Setup Time   Final    GRAM POSITIVE COCCI IN BOTH AEROBIC AND  ANAEROBIC BOTTLES Gram Stain Report Called to,Read Back By and Verified With: COLEMAN @ 1829 ON [id number] BY HENDERSON L CRITICAL RESULT CALLED TO, READ BACK BY AND VERIFIED WITH: PHARMD LAURIE POOLE ON 06/23/22 @ 1619 BY DRT    Culture (A)  Final    STAPHYLOCOCCUS WARNERI THE SIGNIFICANCE OF ISOLATING THIS ORGANISM FROM A SINGLE SET OF BLOOD CULTURES WHEN MULTIPLE SETS ARE DRAWN IS UNCERTAIN. PLEASE NOTIFY THE MICROBIOLOGY DEPARTMENT WITHIN ONE WEEK IF SPECIATION AND SENSITIVITIES ARE REQUIRED. Performed at [facility], [place] [address] [place], [place] [address]    Report Status 06/25/2022 FINAL  Final  Culture, blood (Routine X 2) w Reflex to ID Panel     Status: None   Collection Time: 06/22/22  5:03 PM   Specimen: Right Antecubital; Blood  Result Value Ref Range Status   Specimen Description RIGHT ANTECUBITAL  Final   Special Requests   Final    BOTTLES DRAWN AEROBIC AND ANAEROBIC Blood Culture adequate volume   Culture   Final    NO GROWTH 5 DAYS Performed at [facility], [address], [place], [place] [address]    Report Status 06/27/2022 FINAL  Final  Blood Culture ID Panel (Reflexed)     Status: Abnormal   Collection Time: 06/22/22  5:03 PM  Result Value Ref Range Status   Enterococcus faecalis NOT DETECTED NOT DETECTED Final   Enterococcus Faecium NOT DETECTED NOT DETECTED Final   Listeria monocytogenes NOT DETECTED NOT DETECTED Final   Staphylococcus species DETECTED (A) NOT DETECTED Final    Comment: CRITICAL RESULT CALLED TO, READ BACK BY AND VERIFIED WITH: PHARMD LAURIE POOLE ON 06/23/22 @ 1619 BY DRT    Staphylococcus aureus (BCID) NOT DETECTED NOT DETECTED Final   Staphylococcus epidermidis NOT DETECTED NOT DETECTED Final   Staphylococcus lugdunensis NOT DETECTED NOT DETECTED Final   Streptococcus species NOT DETECTED NOT DETECTED Final   Streptococcus agalactiae NOT DETECTED NOT DETECTED Final   Streptococcus pneumoniae NOT DETECTED NOT DETECTED Final    Streptococcus pyogenes NOT DETECTED NOT DETECTED Final   A.calcoaceticus-baumannii NOT DETECTED NOT DETECTED Final   Bacteroides fragilis NOT  DETECTED NOT DETECTED Final   Enterobacterales NOT DETECTED NOT DETECTED Final   Enterobacter cloacae complex NOT DETECTED NOT DETECTED Final   Escherichia coli NOT DETECTED NOT DETECTED Final   Klebsiella aerogenes NOT DETECTED NOT DETECTED Final   Klebsiella oxytoca NOT DETECTED NOT DETECTED Final   Klebsiella pneumoniae NOT DETECTED NOT DETECTED Final   Proteus species NOT DETECTED NOT DETECTED Final   Salmonella species NOT DETECTED NOT DETECTED Final   Serratia marcescens NOT DETECTED NOT DETECTED Final   Haemophilus influenzae NOT DETECTED NOT DETECTED Final   Neisseria meningitidis NOT DETECTED NOT DETECTED Final   Pseudomonas aeruginosa NOT DETECTED NOT DETECTED Final   Stenotrophomonas maltophilia NOT DETECTED NOT DETECTED Final   Candida albicans NOT DETECTED NOT DETECTED Final   Candida auris NOT DETECTED NOT DETECTED Final   Candida glabrata NOT DETECTED NOT DETECTED Final   Candida krusei NOT DETECTED NOT DETECTED Final   Candida parapsilosis NOT DETECTED NOT DETECTED Final   Candida tropicalis NOT DETECTED NOT DETECTED Final   Cryptococcus neoformans/gattii NOT DETECTED NOT DETECTED Final    Comment: Performed at [facility], [place] [address] [place], [place] [address]  MRSA Next Gen by PCR, Nasal     Status: None   Collection Time: 06/23/22  5:27 PM   Specimen: Nasal Mucosa; Nasal Swab  Result Value Ref Range Status   MRSA by PCR Next Gen NOT DETECTED NOT DETECTED Final    Comment: (NOTE) The GeneXpert MRSA Assay (FDA approved for NASAL specimens only), is one component of a comprehensive MRSA colonization surveillance program. It is not intended to diagnose MRSA infection nor to guide or monitor treatment for MRSA infections. Test performance is not FDA approved in patients less than 50 years old. Performed at [facility], [address] [place], [place] [address]   Culture, blood (Routine X 2) w Reflex to ID Panel     Status: None   Collection Time: 06/24/22  8:55 AM   Specimen: Right Antecubital  Result Value Ref Range Status   Specimen Description   Final    RIGHT ANTECUBITAL BOTTLES DRAWN AEROBIC AND ANAEROBIC   Special Requests   Final    Blood Culture results may not be optimal due to an excessive volume of blood received in culture bottles   Culture   Final    NO GROWTH 5 DAYS Performed at [facility], [address] [place], [place] [address]    Report Status 06/29/2022 FINAL  Final  Culture, blood (Routine X 2) w Reflex to ID Panel     Status: None   Collection Time: 06/24/22  8:55 AM   Specimen: BLOOD RIGHT HAND  Result Value Ref Range Status   Specimen Description   Final    BLOOD RIGHT HAND BOTTLES DRAWN AEROBIC AND ANAEROBIC   Special Requests   Final    Blood Culture results may not be optimal due to an excessive volume of blood received in culture bottles   Culture   Final    NO GROWTH 5 DAYS Performed at [facility], [address] [place], [place] [address]    Report Status 06/29/2022 FINAL  Final    Labs: CBC: Recent Labs  Lab 07/13/22 0642 07/16/22 0643 07/18/22 0517  WBC 8.3 6.2 4.8  HGB 12.5* 12.8* 11.6*  HCT 40.2 40.5 36.5*  MCV 92.4 91.0 91.7  PLT 405* 437* 703   Basic Metabolic Panel: Recent Labs  Lab 07/12/22 0733 07/13/22 0642 07/15/22 0704  07/16/22 0643 07/17/22 0725 07/18/22 0517  NA 140 142  --  140 139 139  K 3.8 4.7  --  3.9 3.7 3.9  CL 107 109  --  105 106 104  CO2 27 24  --  22 25 27   GLUCOSE 112* 100*  --  130* 106* 99  BUN 41* 38*  --  32* 31* 25*  CREATININE 1.23 1.17 1.23 1.23 1.09 1.05  CALCIUM 8.6* 9.2  --  9.0 8.5* 8.7*   Liver Function Tests: Recent Labs  Lab 07/13/22 0642 07/16/22 0643 07/17/22 0725 07/18/22 0517  AST 18 24 19 19   ALT 15 16 14 15   ALKPHOS 71 79 67 68  BILITOT 0.4 0.6 0.5 0.5  PROT 6.7 6.9 6.0* 6.1*   ALBUMIN 3.2* 3.3* 2.9* 2.9*   CBG: Recent Labs  Lab 07/17/22 1148 07/17/22 1633 07/17/22 2034 07/18/22 0745 07/18/22 1207  GLUCAP 123* 135* 170* 80 117*    Discharge time spent: less than 30 minutes.  Signed: Marylu Lund, MD [facility] 07/18/2022

## 2022-07-18 NOTE — TOC Transition Note (Signed)
Transition of Care [facility]) - CM/SW Discharge Note   Patient Details  Name: Jordan [name] MRN: [id number] Date of Birth: February 06, 1923  Transition of Care [facility]) CM/SW Contact:  Vassie Moselle, LCSW Phone Number: 07/18/2022, 2:19 PM   Clinical Narrative:    Pt is to return home at discharge with home health PT/OT/Aide/RN/SW through [facility]. Pt has no DME needs.  CSW spoke with daughter who shares that she has not arranged private caregiver services as she has just called to begin looking today. She shares she has been sick and this is the first day she has left the house. CSW shared that pt was medically ready to discharge last week and pt cannot remain in hospital while she is seeking private caregiver services as this does not qualify pt for continued hospital stay. Pt's daughter attempted to delay discharge again until tomorrow, however later agreed to pick pt up from hospital tonight.    Final next level of care: Home w Home Health Services Barriers to Discharge: Barriers Resolved   Patient Goals and CMS Choice CMS Medicare.gov Compare Post Acute Care list provided to:: Patient Represenative (must comment) Choice offered to / list presented to : Adult Children  Discharge Placement                         Discharge Plan and Services Additional resources added to the After Visit Summary for   In-house Referral: Clinical Social Work Discharge Planning Services: CM Consult Post Acute Care Choice: [place]          DME Arranged: [name] DME Agency: NA       HH Arranged: PT, OT, Nurse's Aide, RN, Social Work [employer] Agency: [place] Date [place]: 07/14/22 Time [place]: 1200 Representative spoke with at [place]: [place] Determinants of Health ([place]) Interventions [place]: No Food Insecurity (07/09/2022)  Housing: Low Risk  (07/09/2022)  Transportation Needs: No Transportation Needs  (07/09/2022)  Utilities: Not At Risk (07/09/2022)  Tobacco Use: Medium Risk (07/08/2022)     Readmission Risk Interventions    07/18/2022    2:18 PM 07/14/2022   11:46 AM 07/11/2022   12:11 PM  Readmission Risk Prevention Plan  Transportation Screening  Complete Complete  PCP or Specialist Appt within 3-5 Days  Complete Complete  HRI or [place]  Complete Complete  Social Work Consult for [place] Planning/Counseling  Complete Complete  Palliative Care Screening  Not Applicable Not Applicable  Medication Review Press photographer)  Complete Complete  PCP or Specialist appointment within 3-5 days of discharge Complete    HRI or [place] Complete    SW Recovery Care/Counseling Consult Complete    [place] Not Applicable

## 2022-07-18 NOTE — Plan of Care (Signed)

## 2022-07-18 NOTE — Progress Notes (Signed)
PTAR came at 2320 to transferred patient back to home. Daughter was called to updated at the same time.

## 2022-07-20 ENCOUNTER — Telehealth: Payer: Self-pay | Admitting: *Deleted

## 2022-07-20 ENCOUNTER — Other Ambulatory Visit: Payer: Self-pay

## 2022-07-20 DIAGNOSIS — F03918 Unspecified dementia, unspecified severity, with other behavioral disturbance: Secondary | ICD-10-CM

## 2022-07-20 DIAGNOSIS — I10 Essential (primary) hypertension: Secondary | ICD-10-CM

## 2022-07-20 DIAGNOSIS — R5381 Other malaise: Secondary | ICD-10-CM | POA: Diagnosis not present

## 2022-07-20 DIAGNOSIS — R531 Weakness: Secondary | ICD-10-CM | POA: Diagnosis not present

## 2022-07-20 DIAGNOSIS — I5032 Chronic diastolic (congestive) heart failure: Secondary | ICD-10-CM

## 2022-07-20 NOTE — Patient Outreach (Signed)
07/20/22  Jordan [name] 09-10-1922 [id number]  Post hospital referral: [name]  Referral request sent for post hospital follow up and care coordination assistance, with support resources follow up.  Natividad Brood, RN BSN [place]  [phone] business mobile phone Toll free office [phone]  *[place]  [phone] Fax number: [phone] [email or web address]

## 2022-07-20 NOTE — Progress Notes (Signed)
  Care Coordination   Note   07/20/2022 Name: Pike Scantlebury Thaxton MRN: [id number] DOB: [birth year]  Moussa Wiegand Apsey is a 89 y.o. year old male who sees Asencion Noble, MD for primary care. I reached out to Smitty Pluck Hase by phone today to offer care coordination services.  Mr. Sheeler daughter [name] POA on file was given information about Care Coordination services today including:   The Care Coordination services include support from the care team which includes your Nurse Coordinator, Clinical Social Worker, or Pharmacist.  The Care Coordination team is here to help remove barriers to the health concerns and goals most important to you. Care Coordination services are voluntary, and the patient may decline or stop services at any time by request to their care team member.   Care Coordination Consent Status: Patient daughter [name] [place] on file agreed to services and verbal consent obtained.   Follow up plan:  Telephone appointment with care coordination team member scheduled for:  07/21/22  Encounter Outcome:  Pt. Scheduled  [place]  Direct Dial: [phone]

## 2022-07-21 ENCOUNTER — Encounter: Payer: Self-pay | Admitting: *Deleted

## 2022-07-21 ENCOUNTER — Ambulatory Visit: Payer: Self-pay | Admitting: *Deleted

## 2022-07-21 ENCOUNTER — Other Ambulatory Visit: Payer: Self-pay

## 2022-07-21 ENCOUNTER — Encounter (HOSPITAL_COMMUNITY): Payer: Self-pay | Admitting: *Deleted

## 2022-07-21 ENCOUNTER — Emergency Department (HOSPITAL_COMMUNITY): Payer: Medicare Other

## 2022-07-21 ENCOUNTER — Emergency Department (HOSPITAL_COMMUNITY)
Admission: EM | Admit: 2022-07-21 | Discharge: 2022-08-03 | Disposition: A | Discharge status: Skilled Nursing Facility | Payer: Medicare Other | Attending: Emergency Medicine | Admitting: Emergency Medicine

## 2022-07-21 DIAGNOSIS — I639 Cerebral infarction, unspecified: Secondary | ICD-10-CM | POA: Diagnosis not present

## 2022-07-21 DIAGNOSIS — F0280 Dementia in other diseases classified elsewhere without behavioral disturbance: Secondary | ICD-10-CM | POA: Diagnosis not present

## 2022-07-21 DIAGNOSIS — R531 Weakness: Secondary | ICD-10-CM | POA: Insufficient documentation

## 2022-07-21 DIAGNOSIS — R0902 Hypoxemia: Secondary | ICD-10-CM | POA: Diagnosis not present

## 2022-07-21 DIAGNOSIS — S59909A Unspecified injury of unspecified elbow, initial encounter: Secondary | ICD-10-CM | POA: Diagnosis not present

## 2022-07-21 DIAGNOSIS — Z7984 Long term (current) use of oral hypoglycemic drugs: Secondary | ICD-10-CM | POA: Diagnosis not present

## 2022-07-21 DIAGNOSIS — S81019A Laceration without foreign body, unspecified knee, initial encounter: Secondary | ICD-10-CM | POA: Diagnosis not present

## 2022-07-21 DIAGNOSIS — F0390 Unspecified dementia without behavioral disturbance: Secondary | ICD-10-CM | POA: Insufficient documentation

## 2022-07-21 DIAGNOSIS — J432 Centrilobular emphysema: Secondary | ICD-10-CM | POA: Insufficient documentation

## 2022-07-21 DIAGNOSIS — Z20822 Contact with and (suspected) exposure to covid-19: Secondary | ICD-10-CM | POA: Diagnosis not present

## 2022-07-21 DIAGNOSIS — R9431 Abnormal electrocardiogram [ECG] [EKG]: Secondary | ICD-10-CM | POA: Diagnosis not present

## 2022-07-21 DIAGNOSIS — I6782 Cerebral ischemia: Secondary | ICD-10-CM | POA: Diagnosis not present

## 2022-07-21 DIAGNOSIS — R296 Repeated falls: Secondary | ICD-10-CM | POA: Diagnosis not present

## 2022-07-21 DIAGNOSIS — R4182 Altered mental status, unspecified: Secondary | ICD-10-CM | POA: Diagnosis not present

## 2022-07-21 DIAGNOSIS — T148XXA Other injury of unspecified body region, initial encounter: Secondary | ICD-10-CM

## 2022-07-21 DIAGNOSIS — S51011A Laceration without foreign body of right elbow, initial encounter: Secondary | ICD-10-CM | POA: Diagnosis not present

## 2022-07-21 DIAGNOSIS — M4312 Spondylolisthesis, cervical region: Secondary | ICD-10-CM | POA: Diagnosis not present

## 2022-07-21 DIAGNOSIS — R627 Adult failure to thrive: Secondary | ICD-10-CM | POA: Diagnosis not present

## 2022-07-21 DIAGNOSIS — I4510 Unspecified right bundle-branch block: Secondary | ICD-10-CM | POA: Diagnosis not present

## 2022-07-21 DIAGNOSIS — W19XXXA Unspecified fall, initial encounter: Secondary | ICD-10-CM | POA: Diagnosis not present

## 2022-07-21 DIAGNOSIS — S51019A Laceration without foreign body of unspecified elbow, initial encounter: Secondary | ICD-10-CM | POA: Insufficient documentation

## 2022-07-21 DIAGNOSIS — Z7982 Long term (current) use of aspirin: Secondary | ICD-10-CM | POA: Diagnosis not present

## 2022-07-21 DIAGNOSIS — S81011A Laceration without foreign body, right knee, initial encounter: Secondary | ICD-10-CM | POA: Diagnosis not present

## 2022-07-21 DIAGNOSIS — M47812 Spondylosis without myelopathy or radiculopathy, cervical region: Secondary | ICD-10-CM | POA: Diagnosis not present

## 2022-07-21 DIAGNOSIS — R5381 Other malaise: Secondary | ICD-10-CM | POA: Diagnosis not present

## 2022-07-21 DIAGNOSIS — R0682 Tachypnea, not elsewhere classified: Secondary | ICD-10-CM | POA: Diagnosis not present

## 2022-07-21 LAB — COMPREHENSIVE METABOLIC PANEL
ALT: 16 U/L (ref 0–44)
AST: 19 U/L (ref 15–41)
Albumin: 3.1 g/dL — ABNORMAL LOW (ref 3.5–5.0)
Alkaline Phosphatase: 80 U/L (ref 38–126)
Anion gap: 8 (ref 5–15)
BUN: 32 mg/dL — ABNORMAL HIGH (ref 8–23)
CO2: 26 mmol/L (ref 22–32)
Calcium: 8.8 mg/dL — ABNORMAL LOW (ref 8.9–10.3)
Chloride: 105 mmol/L (ref 98–111)
Creatinine, Ser: 1.09 mg/dL (ref 0.61–1.24)
GFR, Estimated: >60 mL/min (ref >60)
Glucose, Bld: 112 mg/dL — ABNORMAL HIGH (ref 70–99)
Potassium: 4.2 mmol/L (ref 3.5–5.1)
Sodium: 139 mmol/L (ref 135–145)
Total Bilirubin: 0.6 mg/dL (ref 0.3–1.2)
Total Protein: 6.2 g/dL — ABNORMAL LOW (ref 6.5–8.1)

## 2022-07-21 LAB — CBC
HCT: 36.1 % — ABNORMAL LOW (ref 39.0–52.0)
Hemoglobin: 11.5 g/dL — ABNORMAL LOW (ref 13.0–17.0)
MCH: 29.3 pg (ref 26.0–34.0)
MCHC: 31.9 g/dL (ref 30.0–36.0)
MCV: 91.9 fL (ref 80.0–100.0)
Platelets: 327 10*3/uL (ref 150–400)
RBC: 3.93 MIL/uL — ABNORMAL LOW (ref 4.22–5.81)
RDW: 13 % (ref 11.5–15.5)
WBC: 8.7 10*3/uL (ref 4.0–10.5)
nRBC: 0 % (ref 0.0–0.2)

## 2022-07-21 LAB — URINALYSIS, ROUTINE W REFLEX MICROSCOPIC
Bilirubin Urine: NEGATIVE
Glucose, UA: NEGATIVE mg/dL
Hgb urine dipstick: NEGATIVE
Ketones, ur: 5 mg/dL — AB
Leukocytes,Ua: NEGATIVE
Nitrite: NEGATIVE
Protein, ur: 30 mg/dL — AB
Specific Gravity, Urine: 1.021 (ref 1.005–1.030)
pH: 5 (ref 5.0–8.0)

## 2022-07-21 LAB — RESP PANEL BY RT-PCR (RSV, FLU A&B, COVID)  RVPGX2
Influenza A by PCR: NEGATIVE
Influenza B by PCR: NEGATIVE
Resp Syncytial Virus by PCR: NEGATIVE
SARS Coronavirus 2 by RT PCR: NEGATIVE

## 2022-07-21 MED ORDER — QUETIAPINE FUMARATE 25 MG PO TABS
75.0000 mg | ORAL_TABLET | Freq: Every day | ORAL | Status: DC
  Administered 2022-07-21 – 2022-08-02 (×13): 75 mg via ORAL
  Filled 2022-07-21 (×13): qty 3

## 2022-07-21 MED ORDER — METFORMIN HCL 500 MG PO TABS: 500.0000 mg | ORAL_TABLET | Freq: Every day | ORAL | Status: DC

## 2022-07-21 MED ORDER — ENSURE ENLIVE PO LIQD
237.0000 mL | Freq: Three times a day (TID) | ORAL | Status: DC
  Administered 2022-07-22 – 2022-08-03 (×29): 237 mL via ORAL
  Filled 2022-07-21 (×48): qty 237

## 2022-07-21 MED ORDER — METOPROLOL TARTRATE 25 MG PO TABS
12.5000 mg | ORAL_TABLET | Freq: Two times a day (BID) | ORAL | Status: DC
  Administered 2022-07-21 – 2022-08-03 (×25): 12.5 mg via ORAL
  Filled 2022-07-21 (×26): qty 1

## 2022-07-21 MED ORDER — METFORMIN HCL 500 MG PO TABS
500.0000 mg | ORAL_TABLET | Freq: Every day | ORAL | Status: DC
  Administered 2022-07-22 – 2022-08-03 (×13): 500 mg via ORAL
  Filled 2022-07-21 (×13): qty 1

## 2022-07-21 MED ORDER — BUDESONIDE 3 MG PO CPEP
3.0000 mg | ORAL_CAPSULE | Freq: Every day | ORAL | Status: DC
  Administered 2022-07-21 – 2022-08-03 (×13): 3 mg via ORAL
  Filled 2022-07-21 (×16): qty 1

## 2022-07-21 MED ORDER — MIRABEGRON ER 25 MG PO TB24
25.0000 mg | ORAL_TABLET | Freq: Every day | ORAL | Status: DC
  Administered 2022-07-21 – 2022-08-03 (×14): 25 mg via ORAL
  Filled 2022-07-21 (×14): qty 1

## 2022-07-21 MED ORDER — ASPIRIN 81 MG PO CHEW
81.0000 mg | CHEWABLE_TABLET | Freq: Every morning | ORAL | Status: DC
  Administered 2022-07-22 – 2022-08-03 (×13): 81 mg via ORAL
  Filled 2022-07-21 (×14): qty 1

## 2022-07-21 MED ORDER — FINASTERIDE 5 MG PO TABS
5.0000 mg | ORAL_TABLET | Freq: Every day | ORAL | Status: DC
  Administered 2022-07-21 – 2022-08-03 (×14): 5 mg via ORAL
  Filled 2022-07-21 (×14): qty 1

## 2022-07-21 MED ORDER — MEMANTINE HCL 10 MG PO TABS
10.0000 mg | ORAL_TABLET | Freq: Two times a day (BID) | ORAL | Status: DC
  Administered 2022-07-21 – 2022-08-03 (×26): 10 mg via ORAL
  Filled 2022-07-21 (×29): qty 1

## 2022-07-21 MED ORDER — SENNA 8.6 MG PO TABS
1.0000 | ORAL_TABLET | Freq: Two times a day (BID) | ORAL | Status: DC
  Administered 2022-07-21 – 2022-08-03 (×25): 8.6 mg via ORAL
  Filled 2022-07-21 (×25): qty 1

## 2022-07-21 NOTE — ED Notes (Signed)
Pt given drink and crackers. Daughter at bedside and states she has been unable to get him to eat or drink but will try to attempt

## 2022-07-21 NOTE — Social Work (Signed)
CSW attempted to call the daughter with the number that the RN has in the patient's chart. [name] CSW also has requested a PT evaluation for the patient.

## 2022-07-21 NOTE — ED Provider Notes (Signed)
[facility] EMERGENCY DEPARTMENT Provider Note   CSN: [id number] Arrival date & time: 07/21/22  1121     History  Chief Complaint  Patient presents with   Altered Mental Status    Jordan [name] is a 89 y.o. male.  Pt with hx dementia, with general weakness and several recent falls. Pt limited historian - level 5 caveat. Recent admission noted w similar symptoms. No report of fevers. No anticoagulant use. No specific physical complaints or other new/specific symptoms noted other than few superficial skin tears. Tetanus up to date.   The history is provided by the patient, the EMS personnel and medical records. The history is limited by the condition of the patient.  Altered Mental Status      Home Medications Prior to Admission medications   Medication Sig Start Date End Date Taking? Authorizing Provider  aspirin 81 MG chewable tablet Chew 81 mg by mouth in the morning. (0800)    [provider]  budesonide (ENTOCORT EC) 3 MG 24 hr capsule TAKE 1 CAPSULE BY MOUTH DAILY. Patient taking differently: Take 3 mg by mouth in the morning. (0800) 06/12/22   [name], MD  feeding supplement (ENSURE ENLIVE / ENSURE PLUS) LIQD Take 237 mLs by mouth 3 (three) times daily after meals. Patient not taking: Reported on 07/09/2022 07/06/22   [name], MD  finasteride (PROSCAR) 5 MG tablet Take 1 tablet (5 mg total) by mouth daily. as directed Patient taking differently: Take 5 mg by mouth in the morning. (0800) 05/04/21   [name], [name], MD  memantine (NAMENDA) 10 MG tablet Take 10 mg by mouth 2 (two) times daily. (0800, 2000)    [provider]  metFORMIN (GLUCOPHAGE) 500 MG tablet Take 500 mg by mouth at bedtime. (2100)    [provider]  metoprolol tartrate (LOPRESSOR) 25 MG tablet Take 0.5 tablets (12.5 mg total) by mouth 2 (two) times daily. Patient taking differently: Take 12.5 mg by mouth 2 (two) times daily. (0800,  2000) 07/06/22 08/05/22  [name], [name], MD  mirabegron ER (MYRBETRIQ) 25 MG TB24 tablet Take 1 tablet (25 mg total) by mouth daily. Patient taking differently: Take 25 mg by mouth in the morning. (0900) 03/17/22   [name], [name], MD  Multiple Vitamins-Minerals (PRESERVISION AREDS) TABS Take 1 tablet by mouth in the morning. (0800)    [provider]  pravastatin (PRAVACHOL) 80 MG tablet Take 80 mg by mouth daily at 6 PM.    [provider]  QUEtiapine (SEROQUEL) 25 MG tablet Take 3 tablets (75 mg total) by mouth at bedtime. Patient taking differently: Take 75 mg by mouth at bedtime. (2100) 07/06/22 08/05/22  [name], Mir [name], MD  senna (SENOKOT) 8.6 MG TABS tablet Take 1 tablet (8.6 mg total) by mouth 2 (two) times daily. 07/18/22 08/17/22  [name], MD      Allergies    Patient has no known allergies.    Review of Systems   Review of Systems  Unable to perform ROS: Dementia    Physical Exam Updated Vital Signs BP (!) 138/57   Pulse 76   Temp 97.9 F (36.6 C) (Oral)   Resp 14   Ht 1.753 m (5\' 9" )   Wt 55.7 kg   SpO2 100%   BMI 18.13 kg/m  Physical Exam Vitals and nursing note reviewed.  Constitutional:      Appearance: Normal appearance. He is well-developed.  HENT:     Head:  Comments: Tenderness scalp, mild.     Nose: Nose normal.     Mouth/Throat:     Mouth: Mucous membranes are moist.     Pharynx: Oropharynx is clear.  Eyes:     General: No scleral icterus.    Conjunctiva/sclera: Conjunctivae normal.     Pupils: Pupils are equal, round, and reactive to light.  Neck:     Vascular: No carotid bruit.     Trachea: No tracheal deviation.     Comments: No stiffness or rigidity.  Cardiovascular:     Rate and Rhythm: Normal rate and regular rhythm.     Pulses: Normal pulses.     Heart sounds: Normal heart sounds. No murmur heard.    No friction rub. No gallop.  Pulmonary:     Effort: Pulmonary effort is normal. No  accessory muscle usage or respiratory distress.     Breath sounds: Normal breath sounds.  Chest:     Chest wall: No tenderness.  Abdominal:     General: Bowel sounds are normal. There is no distension.     Palpations: Abdomen is soft.     Tenderness: There is no abdominal tenderness. There is no guarding.     Comments: No abd bruising or contusion noted.   Genitourinary:    Comments: No cva tenderness. Musculoskeletal:        General: No swelling.     Cervical back: Normal range of motion and neck supple. No rigidity.     Comments: CTLS spine, non tender, aligned, no step off. Good rom bil extremities without focal pain or bony tenderness. Superficial skin tear to elbow and knee.   Skin:    General: Skin is warm and dry.     Findings: No rash.  Neurological:     Mental Status: He is alert.     Comments: Awake and alert appearing. Moves bilateral extremities purposefully with good strength.   Psychiatric:        Mood and Affect: Mood normal.     ED Results / Procedures / Treatments   Labs (all labs ordered are listed, but only abnormal results are displayed) Results for orders placed or performed during the hospital encounter of 07/21/22  Resp panel by RT-PCR (RSV, Flu A&B, Covid) Anterior Nasal Swab   Specimen: Anterior Nasal Swab  Result Value Ref Range   SARS Coronavirus 2 by RT PCR NEGATIVE NEGATIVE   Influenza A by PCR NEGATIVE NEGATIVE   Influenza B by PCR NEGATIVE NEGATIVE   Resp Syncytial Virus by PCR NEGATIVE NEGATIVE  CBC  Result Value Ref Range   WBC 8.7 4.0 - 10.5 K/uL   RBC 3.93 (L) 4.22 - 5.81 MIL/uL   Hemoglobin 11.5 (L) 13.0 - 17.0 g/dL   HCT 36.1 (L) 39.0 - 52.0 %   MCV 91.9 80.0 - 100.0 fL   MCH 29.3 26.0 - 34.0 pg   MCHC 31.9 30.0 - 36.0 g/dL   RDW 13.0 11.5 - 15.5 %   Platelets 327 150 - 400 K/uL   nRBC 0.0 0.0 - 0.2 %  Comprehensive metabolic panel  Result Value Ref Range   Sodium 139 135 - 145 mmol/L   Potassium 4.2 3.5 - 5.1 mmol/L    Chloride 105 98 - 111 mmol/L   CO2 26 22 - 32 mmol/L   Glucose, Bld 112 (H) 70 - 99 mg/dL   BUN 32 (H) 8 - 23 mg/dL   Creatinine, Ser 1.09 0.61 - 1.24 mg/dL   Calcium  8.8 (L) 8.9 - 10.3 mg/dL   Total Protein 6.2 (L) 6.5 - 8.1 g/dL   Albumin 3.1 (L) 3.5 - 5.0 g/dL   AST 19 15 - 41 U/L   ALT 16 0 - 44 U/L   Alkaline Phosphatase 80 38 - 126 U/L   Total Bilirubin 0.6 0.3 - 1.2 mg/dL   GFR, Estimated >60 >60 mL/min   Anion gap 8 5 - 15  Urinalysis, Routine w reflex microscopic Urine, In & Out Cath  Result Value Ref Range   Color, Urine YELLOW YELLOW   APPearance CLEAR CLEAR   Specific Gravity, Urine 1.021 1.005 - 1.030   pH 5.0 5.0 - 8.0   Glucose, UA NEGATIVE NEGATIVE mg/dL   Hgb urine dipstick NEGATIVE NEGATIVE   Bilirubin Urine NEGATIVE NEGATIVE   Ketones, ur 5 (A) NEGATIVE mg/dL   Protein, ur 30 (A) NEGATIVE mg/dL   Nitrite NEGATIVE NEGATIVE   Leukocytes,Ua NEGATIVE NEGATIVE   RBC / HPF 21-50 0 - 5 RBC/hpf   WBC, UA 0-5 0 - 5 WBC/hpf   Bacteria, UA RARE (A) NONE SEEN   Squamous Epithelial / HPF 0-5 0 - 5 /HPF   Mucus PRESENT    CT Head Wo Contrast  Result Date: 07/21/2022 CLINICAL DATA:  Fall EXAM: CT HEAD WITHOUT CONTRAST CT CERVICAL SPINE WITHOUT CONTRAST TECHNIQUE: Multidetector CT imaging of the head and cervical spine was performed following the standard protocol without intravenous contrast. Multiplanar CT image reconstructions of the cervical spine were also generated. RADIATION DOSE REDUCTION: This exam was performed according to the departmental dose-optimization program which includes automated exposure control, adjustment of the mA and/or kV according to patient size and/or use of iterative reconstruction technique. COMPARISON:  CT head and cervical spine 07/08/2022 FINDINGS: CT HEAD FINDINGS Brain: There is no acute intracranial hemorrhage, extra-axial fluid collection, or acute infarct There is unchanged background parenchymal volume loss with prominence of the  ventricular system and extra-axial CSF spaces. The ventricles are stable in size. Small remote infarcts in the basal ganglia and mild background chronic small-vessel ischemic change are stable. The pituitary and suprasellar region are normal. There is no mass lesion. There is no mass effect or midline shift. Vascular: There is calcification of the bilateral carotid siphons. Skull: Normal. Negative for fracture or focal lesion. Sinuses/Orbits: There is mucosal thickening in the left sphenoid sinus. Bilateral lens implants are in place. The globes and orbits are otherwise unremarkable. Other: None. CT CERVICAL SPINE FINDINGS Alignment: There is unchanged grade 1 anterolisthesis of C3 on C4 at C4 on C5. There is no jumped or perched facet or other evidence of traumatic malalignment. Skull base and vertebrae: Skull base alignment is maintained. Vertebral body heights are preserved. There is no evidence of acute fracture. There is no suspicious osseous lesion. Soft tissues and spinal canal: No prevertebral fluid or swelling. No visible canal hematoma. Disc levels: There is bulky degenerative pannus about the dens resulting in moderate to severe narrowing of the craniocervical junction, unchanged. There is multilevel disc space narrowing and degenerative endplate change, most advanced at C5-C6 and C6-C7 with probably at least moderate spinal canal stenosis C5-C6 and C6-C7. Upper chest: Biapical scarring is unchanged. Other: None. IMPRESSION: 1. Stable noncontrast head CT with no acute intracranial pathology. 2. No acute fracture or traumatic malalignment of the cervical spine. 3. Multilevel degenerative changes as above, stable. Electronically Signed   By: Valetta Mole M.D.   On: 07/21/2022 13:17   CT Cervical Spine Wo Contrast  Result Date: 07/21/2022 CLINICAL DATA:  Fall EXAM: CT HEAD WITHOUT CONTRAST CT CERVICAL SPINE WITHOUT CONTRAST TECHNIQUE: Multidetector CT imaging of the head and cervical spine was performed  following the standard protocol without intravenous contrast. Multiplanar CT image reconstructions of the cervical spine were also generated. RADIATION DOSE REDUCTION: This exam was performed according to the departmental dose-optimization program which includes automated exposure control, adjustment of the mA and/or kV according to patient size and/or use of iterative reconstruction technique. COMPARISON:  CT head and cervical spine 07/08/2022 FINDINGS: CT HEAD FINDINGS Brain: There is no acute intracranial hemorrhage, extra-axial fluid collection, or acute infarct There is unchanged background parenchymal volume loss with prominence of the ventricular system and extra-axial CSF spaces. The ventricles are stable in size. Small remote infarcts in the basal ganglia and mild background chronic small-vessel ischemic change are stable. The pituitary and suprasellar region are normal. There is no mass lesion. There is no mass effect or midline shift. Vascular: There is calcification of the bilateral carotid siphons. Skull: Normal. Negative for fracture or focal lesion. Sinuses/Orbits: There is mucosal thickening in the left sphenoid sinus. Bilateral lens implants are in place. The globes and orbits are otherwise unremarkable. Other: None. CT CERVICAL SPINE FINDINGS Alignment: There is unchanged grade 1 anterolisthesis of C3 on C4 at C4 on C5. There is no jumped or perched facet or other evidence of traumatic malalignment. Skull base and vertebrae: Skull base alignment is maintained. Vertebral body heights are preserved. There is no evidence of acute fracture. There is no suspicious osseous lesion. Soft tissues and spinal canal: No prevertebral fluid or swelling. No visible canal hematoma. Disc levels: There is bulky degenerative pannus about the dens resulting in moderate to severe narrowing of the craniocervical junction, unchanged. There is multilevel disc space narrowing and degenerative endplate change, most advanced  at C5-C6 and C6-C7 with probably at least moderate spinal canal stenosis C5-C6 and C6-C7. Upper chest: Biapical scarring is unchanged. Other: None. IMPRESSION: 1. Stable noncontrast head CT with no acute intracranial pathology. 2. No acute fracture or traumatic malalignment of the cervical spine. 3. Multilevel degenerative changes as above, stable. Electronically Signed   By: Valetta Mole M.D.   On: 07/21/2022 13:17   DG Chest Portable 1 View  Result Date: 07/08/2022 CLINICAL DATA:  [place] 2 hours ago EXAM: PORTABLE CHEST 1 VIEW COMPARISON:  06/20/2022, 06/25/2022 FINDINGS: Single frontal view of the chest demonstrates an unremarkable cardiac silhouette. Stable atherosclerosis. No airspace disease, effusion, or pneumothorax. Persistent peripheral ground-glass nodularity left upper lobe overlying the lateral aspect second rib. Please see recent CT chest discussion. No acute bony abnormality. IMPRESSION: 1. No acute intrathoracic process. 2. Stable peripheral ground-glass nodularity within the left upper lobe, please refer to recent chest CT 06/25/2022 discussing likely underlying indolent adenocarcinoma. Electronically Signed   By: Randa Ngo M.D.   On: 07/08/2022 18:58   DG Pelvis Portable  Result Date: 07/08/2022 CLINICAL DATA:  [place] 2 hours ago EXAM: PORTABLE PELVIS 1-2 VIEWS COMPARISON:  None Available. FINDINGS: Single frontal view of the pelvis includes both hips. No acute displaced fracture. Symmetrical bilateral hip osteoarthritis. Prominent degenerative changes of the lumbosacral junction. Diffuse atherosclerosis. IMPRESSION: 1. No acute displaced fracture. Electronically Signed   By: Randa Ngo M.D.   On: 07/08/2022 18:56   CT HEAD WO CONTRAST (5MM)  Result Date: 07/08/2022 CLINICAL DATA:  Fall.  Head injury. EXAM: CT HEAD WITHOUT CONTRAST CT CERVICAL SPINE WITHOUT CONTRAST TECHNIQUE: Multidetector CT imaging of the head  and cervical spine was performed following the standard protocol  without intravenous contrast. Multiplanar CT image reconstructions of the cervical spine were also generated. RADIATION DOSE REDUCTION: This exam was performed according to the departmental dose-optimization program which includes automated exposure control, adjustment of the mA and/or kV according to patient size and/or use of iterative reconstruction technique. COMPARISON:  06/22/2022. FINDINGS: CT HEAD FINDINGS Brain: No evidence of acute infarction, hemorrhage, hydrocephalus, extra-axial collection or mass lesion/mass effect. Vascular: No hyperdense vessel or unexpected calcification. Skull: Normal. Negative for fracture or focal lesion. Sinuses/Orbits: Globes and orbits are unremarkable. Dependent secretions in the left sphenoid sinus. Remaining sinuses are clear. Other: None. CT CERVICAL SPINE FINDINGS Alignment: Mild kyphosis, apex at C5-C6.  No spondylolisthesis. Skull base and vertebrae: No acute fracture. No primary bone lesion or focal pathologic process. Soft tissues and spinal canal: No prevertebral fluid or swelling. No visible canal hematoma. Disc levels: Moderate loss of disc height at C4-C5, C5-C6 and C6-C7. Disc bulging and endplate spurring noted at these levels, greatest at C5-C6 and C6-C7. No convincing disc herniation. Facet degenerative changes, greatest at C2-C3 and C3-C4. Upper chest: No acute findings. Pleuroparenchymal scarring noted at the lung apices. Other: None. IMPRESSION: HEAD CT 1. No acute intracranial abnormalities. CERVICAL CT 1. No fracture or acute finding. Electronically Signed   By: Lajean Manes M.D.   On: 07/08/2022 18:47   CT Cervical Spine Wo Contrast  Result Date: 07/08/2022 CLINICAL DATA:  Fall.  Head injury. EXAM: CT HEAD WITHOUT CONTRAST CT CERVICAL SPINE WITHOUT CONTRAST TECHNIQUE: Multidetector CT imaging of the head and cervical spine was performed following the standard protocol without intravenous contrast. Multiplanar CT image reconstructions of the  cervical spine were also generated. RADIATION DOSE REDUCTION: This exam was performed according to the departmental dose-optimization program which includes automated exposure control, adjustment of the mA and/or kV according to patient size and/or use of iterative reconstruction technique. COMPARISON:  06/22/2022. FINDINGS: CT HEAD FINDINGS Brain: No evidence of acute infarction, hemorrhage, hydrocephalus, extra-axial collection or mass lesion/mass effect. Vascular: No hyperdense vessel or unexpected calcification. Skull: Normal. Negative for fracture or focal lesion. Sinuses/Orbits: Globes and orbits are unremarkable. Dependent secretions in the left sphenoid sinus. Remaining sinuses are clear. Other: None. CT CERVICAL SPINE FINDINGS Alignment: Mild kyphosis, apex at C5-C6.  No spondylolisthesis. Skull base and vertebrae: No acute fracture. No primary bone lesion or focal pathologic process. Soft tissues and spinal canal: No prevertebral fluid or swelling. No visible canal hematoma. Disc levels: Moderate loss of disc height at C4-C5, C5-C6 and C6-C7. Disc bulging and endplate spurring noted at these levels, greatest at C5-C6 and C6-C7. No convincing disc herniation. Facet degenerative changes, greatest at C2-C3 and C3-C4. Upper chest: No acute findings. Pleuroparenchymal scarring noted at the lung apices. Other: None. IMPRESSION: HEAD CT 1. No acute intracranial abnormalities. CERVICAL CT 1. No fracture or acute finding. Electronically Signed   By: Lajean Manes M.D.   On: 07/08/2022 18:47   Overnight EEG with video  Result Date: 06/26/2022 Samuella Cota, MD     06/26/2022  7:58 AM EEG Procedure CPT/Type of Study: 37858; 2-12hr EEG with video Referring Provider: Ghimire Primary Neurological Diagnosis: AMS History: This is a 89 yr old patient, undergoing an EEG to evaluate for AMS. Clinical State: disoriented Technical Description: The EEG was performed using standard setting per the guidelines of American  Clinical Neurophysiology Society (ACNS). A minimum of 21 electrodes were placed on scalp according to the International 10-20 or/and  10-10 Systems. Supplemental electrodes were placed as needed. Single EKG electrode was also used to detect cardiac arrhythmia. Patient's behavior was continuously recorded on video simultaneously with EEG. A minimum of 16 channels were used for data display. Each epoch of study was reviewed manually daily and as needed using standard referential and bipolar montages. Computerized quantitative EEG analysis (such as compressed spectral array analysis, trending, automated spike & seizure detection) were used as indicated. Day 1: from 1324 06/25/22 to 1856 06/26/22 EEG Description: Overall Amplitude:Normal Predominant Frequency: The background activity showed theta/delta slowing occurring frequently Superimposed Frequencies: sparse beta activity bilaterally The background was symmetric Background Abnormalities: Generalized slowing as above Rhythmic or periodic pattern: No Epileptiform activity: no Electrographic seizures: no Events: no Breach rhythm: no Reactivity: Present Stimulation procedures: Hyperventilation: not done Photic stimulation: not done Sleep Background: Stage I EKG:no significant arrhythmia Impression: This was an abnormal continuous EEG due to diffuse background slowing, indicative of a non-specific encephalopathy pattern. No seizures or epileptiform discharges were seen.   CT CHEST ABDOMEN PELVIS W CONTRAST  Result Date: 06/25/2022 CLINICAL DATA:  Lung mass identified by CTA head and neck, evaluate for malignancy * Tracking Code: BO * EXAM: CT CHEST, ABDOMEN, AND PELVIS WITH CONTRAST TECHNIQUE: Multidetector CT imaging of the chest, abdomen and pelvis was performed following the standard protocol during bolus administration of intravenous contrast. RADIATION DOSE REDUCTION: This exam was performed according to the departmental dose-optimization program which includes  automated exposure control, adjustment of the mA and/or kV according to patient size and/or use of iterative reconstruction technique. CONTRAST:  64mL OMNIPAQUE IOHEXOL 350 MG/ML SOLN COMPARISON:  CT chest angiogram, 06/13/2016, CT abdomen pelvis, 06/17/2016 FINDINGS: CT CHEST FINDINGS Cardiovascular: Aortic atherosclerosis. Aortic valve calcifications. Normal heart size. Three-vessel coronary artery calcifications. No pericardial effusion. Mediastinum/Nodes: No enlarged mediastinal, hilar, or axillary lymph nodes. Thyroid gland, trachea, and esophagus demonstrate no significant findings. Lungs/Pleura: In comparison to most recent examination of the chest dated 06/13/2016, there has been significant interval increase in size and solid character of a previously seen subsolid opacity of the peripheral left upper lobe, now measuring 2.6 x 2.2 cm, previously 2.1 x 1.4 cm (series 5, image 37). Mild underlying centrilobular emphysema. Diffuse bilateral bronchial wall thickening. Small bilateral pleural effusions. No pleural effusion or pneumothorax. Musculoskeletal: No chest wall abnormality. No acute osseous findings. CT ABDOMEN PELVIS FINDINGS Hepatobiliary: No solid liver abnormality is seen. No gallstones, gallbladder wall thickening, or biliary dilatation. Pancreas: Unremarkable. No pancreatic ductal dilatation or surrounding inflammatory changes. Spleen: Normal in size without significant abnormality. Adrenals/Urinary Tract: Adrenal glands are unremarkable. Kidneys are normal, without renal calculi, solid lesion, or hydronephrosis. Bladder is unremarkable. Stomach/Bowel: Stomach is within normal limits. Diverticulum of the descending duodenum. Appendix appears normal. No evidence of bowel wall thickening, distention, or inflammatory changes. Pancolonic diverticulosis. Vascular/Lymphatic: Aortic atherosclerosis. Interval enlargement of an aneurysm of the infrarenal abdominal aorta measuring up to 4.4 x 3.8 cm,  previously 3.8 x 3.7 cm (series 3, image 68). No enlarged abdominal or pelvic lymph nodes. Reproductive: Prostatomegaly. Other: Small, fat containing left inguinal hernia.  No ascites. Musculoskeletal: No acute osseous findings. IMPRESSION: 1. In comparison to most recent examination of the chest dated 06/13/2016, there has been significant interval increase in size and solid character of a previously seen subsolid opacity of the peripheral left upper lobe, now measuring 2.6 x 2.2 cm, previously 2.1 x 1.4 cm. This is highly concerning for indolent adenocarcinoma. PET-CT characterization and tissue sampling can be considered if clinically  appropriate given advanced patient age. 2. No evidence of lymphadenopathy or metastatic disease in the chest, abdomen, or pelvis. 3. Mild emphysema and diffuse bilateral bronchial wall thickening. 4. Small bilateral pleural effusions. 5. Interval enlargement of an aneurysm of the infrarenal abdominal aorta measuring up to 4.4 x 3.8 cm, previously 3.8 x 3.7 cm. Recommend follow-up every 12 months and vascular consultation if clinically appropriate. This recommendation follows ACR consensus guidelines: White Paper of the ACR Incidental Findings Committee II on Vascular Findings. J Am Coll Radiol 2013; 10:789-794. 6. Prostatomegaly. 7. Coronary artery disease. Aortic Atherosclerosis (ICD10-I70.0) and Emphysema (ICD10-J43.9). Electronically Signed   By: Delanna Ahmadi M.D.   On: 06/25/2022 16:44   EEG adult  Result Date: 06/23/2022 Landry Corporal, MD     06/23/2022  3:32 PM [facility] Routine EEG Report Demographics: Patient Name:   [name] Date of Birth:   September 27, 1922 Identification Number:   MRN - [id number] Study Times: Study Start Time:   06/23/2022 14:19:56 Study End Time:   06/23/2022 14:43:35 Duration:   23 minutes Indication(s): Encephalopathy Technical Summary: This EEG was performed utilizing standard International  10-20 System of electrode placement. Data were obtained and interpreted utilizing referential montage recording, with reformatting to longitudinal, transverse bipolar, and referential montages as necessary for interpretation. State(s):       Awake Activation Procedures: Hyperventilation: Not performed Photic Stimulation: Not performed EEG Description: No normal patterns of awake or sleep were present during this recording. Intermittent generalized theta-delta slowing was present. Excess continuous generalized theta-delta slowing was present. Minor muscle, motion, electrode, and eye movement artifacts were occasionally noted. Impression: Abnormal EEG due to:   1) No normal patterns of awake or sleep  2) Intermittent generalized theta-delta slowing  3) Continuous generalized theta-delta slowing  Clinical Correlation: This EEG is suggestive of a moderate encephalopathy, but is nonspecific as to etiology. The absence of epileptiform abnormalities does not preclude a clinical diagnosis of seizures. [facility] For Inpatient follow-up with [facility] physician please call [facility] [phone]. This is not an outpatient service. Post hospital discharge, please contact hospital directly. Please do not communicate with [facility] physicians via secure chat. If you have any questions, Please contact [facility].  CT Head Wo Contrast  Result Date: 06/22/2022 CLINICAL DATA:  Altered mental status EXAM: CT HEAD WITHOUT CONTRAST TECHNIQUE: Contiguous axial images were obtained from the base of the skull through the vertex without intravenous contrast. RADIATION DOSE REDUCTION: This exam was performed according to the departmental dose-optimization program which includes automated exposure control, adjustment of the mA and/or kV according to patient size and/or use of iterative reconstruction technique. COMPARISON:  None Available. FINDINGS: Brain: No evidence of acute infarction, hemorrhage, hydrocephalus, extra-axial  collection or mass lesion/mass effect. Advanced parenchymal volume loss with ex vacuo dilation of the ventricles again noted. Mild periventricular white matter changes are present likely reflecting the sequela of small vessel ischemia. Vascular: No hyperdense vessel or unexpected calcification. Skull: Normal. Negative for fracture or focal lesion. Sinuses/Orbits: No acute finding. Other: Mastoid air cells and middle ear cavities are clear. IMPRESSION: 1. No acute intracranial abnormality. 2. Advanced parenchymal volume loss with ex vacuo dilation of the ventricles again noted. 3. Mild periventricular white matter changes likely reflecting the sequela of small vessel ischemia. Electronically Signed   By: Fidela Salisbury M.D.   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 Multiplanar CT image reconstructions of the cervical spine were also generated. RADIATION DOSE REDUCTION: This exam was performed according to the departmental dose-optimization program which includes automated exposure control, adjustment of the mA and/or kV according to patient size and/or use of iterative reconstruction technique. COMPARISON:  CT head and cervical spine 07/08/2022 FINDINGS: CT HEAD FINDINGS Brain: There is no acute intracranial hemorrhage, extra-axial fluid collection, or acute infarct There is unchanged background parenchymal volume loss  with prominence of the ventricular system and extra-axial CSF spaces. The ventricles are stable in size. Small remote infarcts in the basal ganglia and mild background chronic small-vessel ischemic change are stable. The pituitary and suprasellar region are normal. There is no mass lesion. There is no mass effect or midline shift. Vascular: There is calcification of the bilateral carotid siphons. Skull: Normal. Negative for fracture or focal lesion. Sinuses/Orbits: There is mucosal thickening in the left sphenoid sinus. Bilateral lens implants are in place. The globes and orbits are otherwise unremarkable. Other: None. CT CERVICAL SPINE FINDINGS Alignment: There is unchanged grade 1 anterolisthesis of C3 on C4 at C4 on C5. There is no jumped or perched facet or other evidence of traumatic malalignment. Skull base and vertebrae: Skull base alignment is maintained. Vertebral body heights are preserved. There is no evidence of acute fracture. There is no suspicious osseous lesion. Soft tissues and spinal canal: No prevertebral fluid or swelling. No visible canal hematoma. Disc levels: There is bulky degenerative pannus about the dens resulting in moderate to severe narrowing of the craniocervical junction, unchanged. There is multilevel disc space narrowing and degenerative endplate change, most advanced at C5-C6 and C6-C7 with probably at least moderate spinal canal stenosis C5-C6 and C6-C7. Upper chest: Biapical scarring is unchanged. Other: None. IMPRESSION: 1. Stable noncontrast head CT with no acute intracranial pathology. 2. No acute fracture or traumatic malalignment of the cervical spine. 3. Multilevel degenerative changes as above, stable. Electronically Signed   By: Valetta Mole M.D.   On: 07/21/2022 13:17   CT Cervical Spine Wo Contrast  Result Date: 07/21/2022 CLINICAL DATA:  Fall EXAM: CT HEAD WITHOUT CONTRAST CT CERVICAL SPINE WITHOUT CONTRAST TECHNIQUE: Multidetector CT imaging of the head and  cervical spine was performed following the standard protocol without intravenous contrast. Multiplanar CT image reconstructions of the cervical spine were also generated. RADIATION DOSE REDUCTION: This exam was performed according to the departmental dose-optimization program which includes automated exposure control, adjustment of the mA and/or kV according to patient size and/or use of iterative reconstruction technique. COMPARISON:  CT head and cervical spine 07/08/2022 FINDINGS: CT HEAD FINDINGS Brain: There is no acute intracranial hemorrhage, extra-axial fluid collection, or acute infarct There is unchanged background parenchymal volume loss with prominence of the ventricular system and extra-axial CSF spaces. The ventricles are stable in size. Small remote infarcts in the basal ganglia and mild background chronic small-vessel ischemic change are stable. The pituitary and suprasellar region are normal. There is no mass lesion. There is no mass effect or midline shift. Vascular: There is calcification of  the bilateral carotid siphons. Skull: Normal. Negative for fracture or focal lesion. Sinuses/Orbits: There is mucosal thickening in the left sphenoid sinus. Bilateral lens implants are in place. The globes and orbits are otherwise unremarkable. Other: None. CT CERVICAL SPINE FINDINGS Alignment: There is unchanged grade 1 anterolisthesis of C3 on C4 at C4 on C5. There is no jumped or perched facet or other evidence of traumatic malalignment. Skull base and vertebrae: Skull base alignment is maintained. Vertebral body heights are preserved. There is no evidence of acute fracture. There is no suspicious osseous lesion. Soft tissues and spinal canal: No prevertebral fluid or swelling. No visible canal hematoma. Disc levels: There is bulky degenerative pannus about the dens resulting in moderate to severe narrowing of the craniocervical junction, unchanged. There is multilevel disc space narrowing and degenerative  endplate change, most advanced at C5-C6 and C6-C7 with probably at least moderate spinal canal stenosis C5-C6 and C6-C7. Upper chest: Biapical scarring is unchanged. Other: None. IMPRESSION: 1. Stable noncontrast head CT with no acute intracranial pathology. 2. No acute fracture or traumatic malalignment of the cervical spine. 3. Multilevel degenerative changes as above, stable. Electronically Signed   By: Valetta Mole M.D.   On: 07/21/2022 13:17    Procedures Procedures    Medications Ordered in ED Medications - No data to display  ED Course/ Medical Decision Making/ A&P                           Medical Decision Making Problems Addressed: Dementia associated with other underlying disease without behavioral disturbance ([place]): chronic illness or injury with exacerbation, progression, or side effects of treatment that poses a threat to life or bodily functions Multiple falls: acute illness or injury with systemic symptoms that poses a threat to life or bodily functions Multiple skin tears: acute illness or injury  Amount and/or Complexity of Data Reviewed Independent Historian: EMS    Details: Family, hx External Data Reviewed: notes. Labs: ordered. Decision-making details documented in ED Course. Radiology: ordered and independent interpretation performed. Decision-making details documented in ED Course. ECG/medicine tests: ordered and independent interpretation performed. Decision-making details documented in ED Course.  Risk Decision regarding hospitalization.   Iv ns. Continuous pulse ox and cardiac monitoring. Labs ordered/sent. Imaging ordered.   Differential diagnosis includes head injury, dehydration, aki, anemia, uti, etc  . Dispo decision including potential need for admission considered - will get labs and imaging and reassess.   Reviewed nursing notes and prior charts for additional history. External reports reviewed.  Recent admission and d/c with similar symptoms noted.  Additional history from: EMS.   Cardiac monitor: sinus rhythm, rate 90.  Labs reviewed/interpreted by me - UA neg for uti. Chem normal.   CT reviewed/interpreted by me - no hem or fx.   Recheck, pt calm, alert, vitals normal. Po fluids/food.   Pt currently appears stable for d/c.  Rec close pcp f/u.  Return precautions provided.   At d/c, daughter indicates not able to care for pt. Was in [place] recently but they checked pt out b/c they did not feel he was receiving care there. Now not able to manage at home w multiple, multiple falls, and home health aides not able to manage pt either.  Daughter requests pt go to [place]. Will consult TOC/SW.  Med rec pending.           Final Clinical Impression(s) / ED Diagnoses Final diagnoses:  Multiple falls  Dementia associated  with other underlying disease without behavioral disturbance ([place])  Multiple skin tears    Rx / DC Orders ED Discharge Orders     None         Lajean Saver, MD 07/21/22 1645

## 2022-07-21 NOTE — ED Notes (Signed)
Gregary Signs, NT walked by pt room, noted that pt had scooted to end of stretcher, pt with hx dementia, pt is alert and disoriented x 3, pt able to state name- pt brief hanging off, wet with urine, pt tangled up in monitoring chords. Monitor devices removed, pt cleaned up and new brief placed on pt, pt stood up and assisted back to bed with 2 staff members, pt unable to walk by self, very unsteady gait, maximum assist to put pt back in bed. Nurse reoriented pt to place, time, and situation, explained that he is unable to walk by himself and will fall, explained that he cannot get up w/o assistance, bed alarm applied to stretcher per protocol. Full bed linen change, gown changed, warm blankets given to pt, light dimmed for comfort and decreased stimuli.

## 2022-07-21 NOTE — ED Triage Notes (Signed)
Pt brought in by [facility] for c/o multiple falls and ams; pt is unable to given any history and people at home were unable to give ems any history

## 2022-07-21 NOTE — Patient Outreach (Signed)
  Care Coordination   Initial Visit Note   07/21/2022 Name: Jordan [name] MRN: [id number] DOB: March 28, 1923  Jordan [name] is a 89 y.o. year old male who sees Asencion Noble, MD for primary care. I  talked with daughter, [name], by telephone today.  What matters to the patients health and wellness today?  Placement in SNF. Unable to safely live at home alone and having difficulty with consistent in-home caregivers    Goals Addressed             This Visit's Progress    Care Coordination Services       Care Coordination Interventions: Assessed social determinant of health barriers Recent hospital admission for fall. Patient was living in a SNF. Per daughter, that didn't last long and he is back at home. They would prefer that he be able to stay in his home, but at this point it doesn't seem that is going to work out. She has caregivers staying with him, but they are having difficulty due to his dementia. Daughter feels that SNF placement is going to be the best option for him at this point.  She has a meeting with [facility] at 12:00 today Provided patient/caregiver with information regarding Care Coordination Services: Services include support from the care team which includes your Nurse Coordinator, Clinical Social Worker, or Pharmacist.  The Care Coordination team is here to help remove barriers to the health concerns and goals most important to you. Care Coordination services are voluntary, and the patient may decline or stop services at any time by request to their care team member.  Patient agreed to services [facility] will follow-up with patient about placement and talk with LCSW if assistance is needed Provided with [facility] direct contact number [phone] and encouraged to reach out as needed Telephone f/u scheduled with [facility] 07/24/22 at 11:30         SDOH assessments and interventions completed:  Yes  SDOH Interventions Today    Flowsheet Row Most Recent Value  SDOH  Interventions   Housing Interventions Intervention Not Indicated  Transportation Interventions Intervention Not Indicated        Care Coordination Interventions:  Yes, provided   Follow up plan: Follow up call scheduled for 07/24/22    Encounter Outcome:  Pt. Visit Completed   Chong Sicilian, BSN, RN-BC RN Care Coordinator [place]: [phone] Main #: [phone]

## 2022-07-21 NOTE — ED Notes (Signed)
Pt daughter [name] [phone]

## 2022-07-21 NOTE — ED Notes (Signed)
Daughter states she doesn't feel like she can care for the patient at home.

## 2022-07-21 NOTE — Discharge Instructions (Addendum)
It was our pleasure to provide your ER care today - we hope that you feel better.  Fall precautions.   Follow up with primary care doctor in the next 1-2 weeks.  Return to ER if worse, new symptoms, fevers, increased trouble breathing, new/severe pain, or other emergency concern.

## 2022-07-21 NOTE — ED Notes (Signed)
Skin abrasions on right knee cleaned and dressed with nonstick telfa.

## 2022-07-22 DIAGNOSIS — R296 Repeated falls: Secondary | ICD-10-CM | POA: Diagnosis not present

## 2022-07-22 DIAGNOSIS — F0280 Dementia in other diseases classified elsewhere without behavioral disturbance: Secondary | ICD-10-CM | POA: Diagnosis not present

## 2022-07-22 DIAGNOSIS — R4182 Altered mental status, unspecified: Secondary | ICD-10-CM | POA: Diagnosis not present

## 2022-07-22 DIAGNOSIS — S51011A Laceration without foreign body of right elbow, initial encounter: Secondary | ICD-10-CM | POA: Diagnosis not present

## 2022-07-22 NOTE — ED Notes (Signed)
Daughter, [name] called for an update. Update was given and family member was consoled due to the shock of the recent changes of her father. Daughter expressed guilt for not being able to care for father on her own. RN consoled daughter. Daughter was reassured that it ok for her to get rest and care for herself. Daughter expressed gratitude for conversation.

## 2022-07-22 NOTE — TOC Initial Note (Addendum)
Transition of Care [facility]) - Initial/Assessment Note    Patient Details  Name: Jordan [name] MRN: [id number] Date of Birth: 07-28-1922  Transition of [facility] CM/SW Contact:    Boneta Lucks, RN Phone Number: 07/22/2022, 1:44 PM  Clinical Narrative:    Patient in ED, PT is recommending SNF. CM spoke with his daughter, Jordan [name]. She can not care for him by herself until he gets some rehab. They had a bad experience at [facility].  She is agreeable to CM sending out FL2 for bed placement, She requested to send to [facility]. FL2 sent out. MD updated that admission offices for SNF do not work weekends.               Expected Discharge Plan: Skilled Nursing Facility Barriers to Discharge: Continued Medical Work up, No SNF bed   Patient Goals and CMS Choice Patient states their goals for this hospitalization and ongoing recovery are:: TO SNF CMS Medicare.gov Compare Post Acute Care list provided to:: Patient Represenative (must comment) Choice offered to / list presented to : Adult Children     Expected Discharge Plan and Services     SNF       Prior Living Arrangements/Services     Patient language and need for interpreter reviewed:: Yes        Need for Family Participation in Patient Care: Yes (Comment) Care giver support system in place?: Yes (comment) Current home services: DME Criminal Activity/Legal Involvement Pertinent to Current Situation/Hospitalization: No - Comment as needed  Activities of Daily Living     Emotional Assessment   Attitude/Demeanor/Rapport: Unable to Assess Affect (typically observed): Unable to Assess Orientation: : Oriented to Self Alcohol / Substance Use: Not Applicable Psych Involvement: No (comment)  Admission diagnosis:  Altered Mental Patient Active Problem List   Diagnosis Date Noted   Hypokalemia 07/08/2022   Leukocytosis 07/08/2022   Chronic diastolic CHF (congestive heart failure) ([place]) 07/08/2022   Acute  encephalopathy 35/00/9381   Acute metabolic encephalopathy 82/99/3716   Confusion with nonfocal neurological examination 06/20/2022   Dementia with behavioral disturbance ([place]) 06/20/2022   Lymphocytic colitis 10/13/2019   Benign prostatic hyperplasia with urinary obstruction 08/20/2019   Nocturia 08/20/2019   Pancreatitis 06/13/2016   Type 2 diabetes mellitus with complication (HCC)    S/P drug eluting coronary stent placement    Hypertension    Hyperlipidemia    History of colonic polyps    Diverticulosis of colon without hemorrhage    Hemorrhoids 10/23/2014   Constipation 06/03/2012   Balance disorder/Fall at home 05/29/2012   PCP:  Asencion Noble, MD Pharmacy:   [place] [place] [address] Phone: [phone] Fax: [phone]   Social Determinants of Health (SDOH) Social History: [place]: No Food Insecurity (07/09/2022)  Housing: Low Risk  (07/21/2022)  Transportation Needs: No Transportation Needs (07/21/2022)  Utilities: Not At Risk (07/09/2022)  Tobacco Use: Medium Risk (07/21/2022)    Readmission Risk Interventions    07/18/2022    2:18 PM 07/14/2022   11:46 AM 07/11/2022   12:11 PM  Readmission Risk Prevention Plan  Transportation Screening  Complete Complete  PCP or Specialist Appt within 3-5 Days  Complete Complete  HRI or Home Care Consult  Complete Complete  Social Work Consult for [place] Planning/Counseling  Complete Complete  Palliative Care Screening  Not Applicable Not Applicable  Medication Review Press photographer)  Complete  Complete  PCP or Specialist appointment within 3-5 days of discharge Complete    HRI or [place] Complete    SW Recovery Care/Counseling Consult Complete    [place] Not Applicable

## 2022-07-22 NOTE — NC FL2 (Signed)
[place] LEVEL OF CARE FORM     IDENTIFICATION  Patient Name: Jordan [name] Birthdate: 11/05/1922 Sex: male Admission Date (Current Location): 07/21/2022  [facility] [place] Number:      Facility and Address:         Provider Number:    Attending Physician Name and Address:  Default, Provider, MD  Relative Name and Phone Number:       Current Level of Care:   Recommended Level of Care:   Prior Approval Number:    Date Approved/Denied:   PASRR Number:    Discharge Plan:      Current Diagnoses: Patient Active Problem List   Diagnosis Date Noted   Hypokalemia 07/08/2022   Leukocytosis 07/08/2022   Chronic diastolic CHF (congestive heart failure) ([place]) 07/08/2022   Acute encephalopathy 61/95/0932   Acute metabolic encephalopathy 67/06/4579   Confusion with nonfocal neurological examination 06/20/2022   Dementia with behavioral disturbance ([place]) 06/20/2022   Lymphocytic colitis 10/13/2019   Benign prostatic hyperplasia with urinary obstruction 08/20/2019   Nocturia 08/20/2019   Pancreatitis 06/13/2016   Type 2 diabetes mellitus with complication (HCC)    S/P drug eluting coronary stent placement    Hypertension    Hyperlipidemia    History of colonic polyps    Diverticulosis of colon without hemorrhage    Hemorrhoids 10/23/2014   Constipation 06/03/2012   Balance disorder/Fall at home 05/29/2012    Orientation RESPIRATION BLADDER Height & Weight            Weight: 55.7 kg Height:  5\' 9"  (175.3 cm)  BEHAVIORAL SYMPTOMS/MOOD NEUROLOGICAL BOWEL NUTRITION STATUS           AMBULATORY STATUS COMMUNICATION OF NEEDS Skin                               Personal Care Assistance Level of Assistance              Functional Limitations Info             SPECIAL CARE FACTORS FREQUENCY                       Contractures      Additional Factors Info                  Current Medications (07/22/2022):  This is  the current hospital active medication list Current Facility-Administered Medications  Medication Dose Route Frequency Provider Last Rate Last Admin   aspirin chewable tablet 81 mg  81 mg Oral q AM Lajean Saver, MD   81 mg at 07/22/22 1033   budesonide (ENTOCORT EC) 24 hr capsule 3 mg  3 mg Oral Daily Lajean Saver, MD   3 mg at 07/22/22 1036   feeding supplement (ENSURE ENLIVE / ENSURE PLUS) liquid 237 mL  237 mL Oral TID PC Lajean Saver, MD   237 mL at 07/22/22 1057   finasteride (PROSCAR) tablet 5 mg  5 mg Oral Daily Lajean Saver, MD   5 mg at 07/22/22 1033   memantine (NAMENDA) tablet 10 mg  10 mg Oral BID Lajean Saver, MD   10 mg at 07/22/22 1033   metFORMIN (GLUCOPHAGE) tablet 500 mg  500 mg Oral Q breakfast Daneil Dolin, MD   500 mg at 07/22/22 1033   metoprolol tartrate (LOPRESSOR) tablet 12.5 mg  12.5 mg Oral BID Lajean Saver, MD  12.5 mg at 07/22/22 1032   mirabegron ER (MYRBETRIQ) tablet 25 mg  25 mg Oral Daily Lajean Saver, MD   25 mg at 07/22/22 1033   QUEtiapine (SEROQUEL) tablet 75 mg  75 mg Oral QHS Lajean Saver, MD   75 mg at 07/21/22 2257   senna (SENOKOT) tablet 8.6 mg  1 tablet Oral BID Lajean Saver, MD   8.6 mg at 07/22/22 1033   Current Outpatient Medications  Medication Sig Dispense Refill   aspirin 81 MG chewable tablet Chew 81 mg by mouth in the morning. (0800)     budesonide (ENTOCORT EC) 3 MG 24 hr capsule TAKE 1 CAPSULE BY MOUTH DAILY. (Patient taking differently: Take 3 mg by mouth in the morning. (0800)) 90 capsule 0   feeding supplement (ENSURE ENLIVE / ENSURE PLUS) LIQD Take 237 mLs by mouth 3 (three) times daily after meals. 237 mL 12   finasteride (PROSCAR) 5 MG tablet Take 1 tablet (5 mg total) by mouth daily. as directed (Patient taking differently: Take 5 mg by mouth in the morning. (0800)) 90 tablet 3   memantine (NAMENDA) 10 MG tablet Take 10 mg by mouth 2 (two) times daily. (0800, 2000)     metFORMIN (GLUCOPHAGE) 500 MG tablet Take 500 mg by  mouth at bedtime. (2100)     metoprolol tartrate (LOPRESSOR) 25 MG tablet Take 0.5 tablets (12.5 mg total) by mouth 2 (two) times daily. (Patient taking differently: Take 12.5 mg by mouth 2 (two) times daily. (0800, 2000)) 30 tablet 0   mirabegron ER (MYRBETRIQ) 25 MG TB24 tablet Take 1 tablet (25 mg total) by mouth daily. (Patient taking differently: Take 25 mg by mouth in the morning. (0900)) 30 tablet 0   Multiple Vitamins-Minerals (PRESERVISION AREDS) TABS Take 1 tablet by mouth in the morning. (0800)     pravastatin (PRAVACHOL) 80 MG tablet Take 80 mg by mouth daily at 6 PM.     QUEtiapine (SEROQUEL) 25 MG tablet Take 3 tablets (75 mg total) by mouth at bedtime. (Patient taking differently: Take 75 mg by mouth at bedtime. (2100)) 90 tablet 0   senna (SENOKOT) 8.6 MG TABS tablet Take 1 tablet (8.6 mg total) by mouth 2 (two) times daily. (Patient not taking: Reported on 07/21/2022) 60 tablet 0     Discharge Medications: Please see discharge summary for a list of discharge medications.  Relevant Imaging Results:  Relevant Lab Results:   Additional Information SSN: [id number]  Boneta Lucks, RN

## 2022-07-22 NOTE — Evaluation (Signed)
Physical Therapy Evaluation Patient Details Name: Jordan [name] MRN: [id number] DOB: 15-Jun-1923 Today's Date: 07/22/2022  History of Present Illness  Jordan [name] is a 89 y.o. male.     Pt with hx dementia, with general weakness and several recent falls. Pt limited historian - level 5 caveat. Recent admission noted w similar symptoms. No report of fevers. No anticoagulant use. No specific physical complaints or other new/specific symptoms noted other than few superficial skin tears. Tetanus up to date.      The history is provided by the patient, the EMS personnel and medical records. The history is limited by the condition of the patient.    Clinical Impression  Patient sleeping on therapist arrival.    Per nursing he has been combative; trying to get out of bed, but with medication has been sleeping.  Patient rouses with max encouragement.  Able to raise his arms and his legs on command.   Needs Max encouragement and moderate assist to turn to the side and sit on the edge of the bed.  Once sitting on the edge of the bed he needs min to mod assist to maintain his balance.  He complains of being cold and wanting to lie back down and keeps his eyes firmly shut throughout all assessment today.  PT assists patient back to lying with moderate assist to straighten his trunk and legs in bed.  Bed alarm turned on, nurse call button left in reach and nursing notified of above.  Patient will benefit from continued skilled therapy services during the remainder of his hospital stay and at the next recommended venue of care to address deficits and promote return to optimal function.         Recommendations for follow up therapy are one component of a multi-disciplinary discharge planning process, led by the attending physician.  Recommendations may be updated based on patient status, additional functional criteria and insurance authorization.  Follow Up Recommendations Skilled nursing-short term  rehab (<3 hours/day) Can patient physically be transported by private vehicle: No    Assistance Recommended at Discharge Frequent or constant Supervision/Assistance  Patient can return home with the following  A lot of help with walking and/or transfers;A lot of help with bathing/dressing/bathroom;Help with stairs or ramp for entrance    Equipment Recommendations None recommended by PT  Recommendations for Other Services       Functional Status Assessment Patient has had a recent decline in their functional status and demonstrates the ability to make significant improvements in function in a reasonable and predictable amount of time.     Precautions / Restrictions Precautions Precautions: Fall Precaution Comments: incontinent, legally blind (can see shapes/shadows) (dementia; hx of being combative) Restrictions Weight Bearing Restrictions: No      Mobility  Bed Mobility Overal bed mobility: Needs Assistance Bed Mobility: Supine to Sit, Sit to Supine     Supine to sit: Mod assist Sit to supine: Mod assist   General bed mobility comments: mod assist and max encouragement for supine to sit.  Patient keeps his eyes shut throughout all bed mobility so did not attempt to stand.  Patient complains of being cold; wants to lie back down.  needs mod A to return back to supine and straighten trunk and legs in bed    Transfers Overall transfer level:  (did not attempt due to lethargy; eyes closed throughout assessment)  Ambulation/Gait                  Stairs            Wheelchair Mobility    Modified Rankin (Stroke Patients Only)       Balance Overall balance assessment: Needs assistance Sitting-balance support: Bilateral upper extremity supported, Feet unsupported   Sitting balance - Comments: static sitting EOB Postural control: Posterior lean                                   Pertinent Vitals/Pain Pain  Assessment Pain Assessment: No/denies pain    Home Living Family/patient expects to be discharged to:: Skilled nursing facility Living Arrangements: Children Available Help at Discharge: Family;Available PRN/intermittently (per chart family feels they cannot safely care for him at home) Type of Home: House           Home Equipment: Conservation officer, nature (2 wheels) Additional Comments: Patient was previously at SNF; family tried to bring him home to care for him but feel they are not able to safely care for him at this time; patient with multiple falls    Prior Function Prior Level of Function : Needs assist;Patient poor historian/Family not available;History of Falls (last six months)                     Hand Dominance   Dominant Hand: Right    Extremity/Trunk Assessment   Upper Extremity Assessment Upper Extremity Assessment: Generalized weakness (bruising noted bilateral upper extremities but able to raise his arms on command.)    Lower Extremity Assessment Lower Extremity Assessment: Generalized weakness (bruising noted; right knee wrapped in a bandage)    Cervical / Trunk Assessment Cervical / Trunk Assessment: Kyphotic  Communication   Communication: HOH  Cognition Arousal/Alertness: Lethargic, Suspect due to medications Behavior During Therapy: WFL for tasks assessed/performed (lethargic during evaluation) Overall Cognitive Status: No family/caregiver present to determine baseline cognitive functioning                                 General Comments: lethargic, HOH; thinks he is at home        General Comments      Exercises     Assessment/Plan    PT Assessment Patient needs continued PT services  PT Problem List Decreased strength;Decreased range of motion;Decreased activity tolerance;Decreased balance;Decreased mobility;Decreased coordination;Decreased cognition;Decreased knowledge of use of DME;Decreased safety awareness;Decreased  knowledge of precautions;Decreased skin integrity       PT Treatment Interventions DME instruction;Gait training;Functional mobility training;Therapeutic activities;Therapeutic exercise;Balance training;Neuromuscular re-education;Cognitive remediation;Patient/family education    PT Goals (Current goals can be found in the Care Plan section)  Acute Rehab PT Goals Patient Stated Goal: none stated PT Goal Formulation: Patient unable to participate in goal setting Time For Goal Achievement: 08/05/22 Potential to Achieve Goals: Fair    Frequency Min 2X/week     Co-evaluation     PT goals addressed during session: Mobility/safety with mobility;Strengthening/ROM         AM-PAC PT "6 Clicks" Mobility  Outcome Measure Help needed turning from your back to your side while in a flat bed without using bedrails?: A Lot Help needed moving from lying on your back to sitting on the side of a flat bed without using bedrails?: A Lot Help needed moving to and from a bed  to a chair (including a wheelchair)?: A Lot Help needed standing up from a chair using your arms (e.g., wheelchair or bedside chair)?: A Lot Help needed to walk in hospital room?: A Lot Help needed climbing 3-5 steps with a railing? : A Lot 6 Click Score: 12    End of Session   Activity Tolerance: Patient limited by lethargy Patient left: in bed;with call bell/phone within reach;with bed alarm set Nurse Communication: Mobility status PT Visit Diagnosis: Repeated falls (R29.6);Muscle weakness (generalized) (M62.81);History of falling (Z91.81)    Time: 1050-1110 PT Time Calculation (min) (ACUTE ONLY): 20 min   Charges:   PT Evaluation $PT Eval Low Complexity: 1 Low          11:31 AM, 07/22/22 Earlee Herald Small Tanekia Ryans MPT [facility] [place] [phone]

## 2022-07-22 NOTE — ED Provider Notes (Signed)
Emergency Medicine Observation Re-evaluation Note  Jordan [name] is a 89 y.o. male, seen on rounds today.  Pt initially presented to the ED for complaints of Altered Mental Status Currently, the patient is sleeping.  Physical Exam  BP 138/69   Pulse 70   Temp 98.1 F (36.7 C) (Oral)   Resp 18   Ht 5\' 9"  (1.753 m)   Wt 55.7 kg   SpO2 99%   BMI 18.13 kg/m  Physical Exam General: Sleeping Cardiac: Extremities well-perfused Lungs: Breathing is unlabored Psych: Deferred  ED Course / MDM  EKG:EKG Interpretation  Date/Time:  Friday July 21 2022 12:26:08 EST Ventricular Rate:  85 PR Interval:  219 QRS Duration: 130 QT Interval:  389 QTC Calculation: 463 R Axis:   265 Text Interpretation: Sinus rhythm Borderline prolonged PR interval RBBB and LAFB Confirmed by Lajean Saver [phone]) on 07/21/2022 12:34:42 PM  I have reviewed the labs performed to date as well as medications administered while in observation.  Recent changes in the last 24 hours include presentation to the ED following multiple falls at home.  Daughter indicated that she was no longer able to care for her father at home.  TOC has been consulted for possible SNF placement.  Plan  Current plan is for SW dispo    Godfrey Pick, MD 07/22/22 [phone]

## 2022-07-22 NOTE — ED Notes (Signed)
Pt was found on hands and knees in bed. Pt had removed brief and urinated. Charge was able to settle pt safely back into bed. Pt is clean, dry, and resting. Bed alarm is on. Pt is in clear view of Network engineer and most staff. RN attempted to call family to assist with monitoring the pt. Daughter, [name] did not answer. Will attempt again at 1015.

## 2022-07-22 NOTE — ED Notes (Signed)
Pt sleeping, visible chest rise and fall observed

## 2022-07-22 NOTE — ED Notes (Signed)
Pt received peri care, gown change and full linen change. Pt and bed are clean and dry.

## 2022-07-22 NOTE — ED Notes (Signed)
Bed alarm goes off at [place] to room pt in bed on hands and knees, pt reoriented to time, place, and situation, pt assisted to lie down in bed on back, HOB raised slightly, warm blankets applied and tucked pt for comfort. Pt lying in bed with eyes closed at this time- Bed alarm pad in place, monitoring at this time.

## 2022-07-23 DIAGNOSIS — R296 Repeated falls: Secondary | ICD-10-CM | POA: Diagnosis not present

## 2022-07-23 DIAGNOSIS — F0280 Dementia in other diseases classified elsewhere without behavioral disturbance: Secondary | ICD-10-CM | POA: Diagnosis not present

## 2022-07-23 DIAGNOSIS — R4182 Altered mental status, unspecified: Secondary | ICD-10-CM | POA: Diagnosis not present

## 2022-07-23 DIAGNOSIS — S51011A Laceration without foreign body of right elbow, initial encounter: Secondary | ICD-10-CM | POA: Diagnosis not present

## 2022-07-23 MED ORDER — HALOPERIDOL LACTATE 5 MG/ML IJ SOLN: 2.0000 mg | Freq: Once | INTRAMUSCULAR | Status: DC

## 2022-07-23 NOTE — ED Provider Notes (Signed)
Emergency Medicine Observation Re-evaluation Note  Jordan [name] is a 89 y.o. male, seen on rounds today.  Pt initially presented to the ED for complaints of Altered Mental Status Currently, the patient is sleeping.  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  Recent changes in the last 24 hours include social work reaching out to nursing facilities with likely placement tomorrow.  Plan  Current plan is for SNF placement.    Godfrey Pick, MD 07/23/22 [phone]

## 2022-07-23 NOTE — ED Notes (Signed)
Patient yelling out "the line has quit moving" repeatedly. Calling staff in room to repeat this and ask for help.

## 2022-07-23 NOTE — ED Notes (Signed)
Incontinent care provided, brief changed. Foam sacral dressing placed due to coccyx redness. Patient refused Ensure after drinking 25% of it. Left at bedside within patient reach.

## 2022-07-23 NOTE — ED Notes (Signed)
Pt took meds without issue

## 2022-07-24 ENCOUNTER — Encounter: Payer: Self-pay | Admitting: *Deleted

## 2022-07-24 ENCOUNTER — Ambulatory Visit: Payer: Self-pay | Admitting: *Deleted

## 2022-07-24 LAB — CBG MONITORING, ED: Glucose-Capillary: 185 mg/dL — ABNORMAL HIGH (ref 70–99)

## 2022-07-24 MED ORDER — ACETAMINOPHEN 500 MG PO TABS
1000.0000 mg | ORAL_TABLET | Freq: Once | ORAL | Status: AC
  Administered 2022-07-24: 1000 mg via ORAL
  Filled 2022-07-24: qty 2

## 2022-07-24 NOTE — ED Notes (Signed)
Informed Dr. Alvino Chapel that pt's been keeping his eyes closed-- although responding and talking but keeping his eyes closed. VSS, checked glucose-- 180s. Pt is usually trying to get out of bed and pull things but patient has been behaving at this time.

## 2022-07-24 NOTE — ED Notes (Signed)
Pt removed diaper, linen soiled. Pt cleaned, diaper and linen changed. Pt able to roll side to side with repetitive instructions. Pt ate apple sauce and drank about 25% of Ensure.

## 2022-07-24 NOTE — ED Notes (Signed)
Attempted to feed patient dinner tray, pt refused. Pt states "I'm full... I'm trying to keep up.. ?oil fill".

## 2022-07-24 NOTE — Patient Outreach (Signed)
  Care Coordination   Follow Up Visit Note   07/24/2022 Name: Jordan [name] MRN: [id number] DOB: 01/21/1923  Jordan [name] Jordan [name] is a 89 y.o. year old male who sees Asencion Noble, MD for primary care. I  spoke with daughter, Jordan [name], by telephone.  What matters to the patients health and wellness today?  SNF placement s/p hosp discharge    Goals Addressed             This Visit's Progress    Care Coordination Services       Care Coordination Interventions: Chart reviewed Current admission at [facility] since 07/21/22 due to falls [facility] team working on SNF placement Talked with daughter earlier this morning and they had not found him a bed at that point. Unable to care for himself at home and unable to have people with him around the clock. They feel SNF is the safest option. Call back this afternoon was unsuccessful. I left a voicemail requesting that she return my call         SDOH assessments and interventions completed:  No     Care Coordination Interventions:  Yes, provided   Follow up plan:  Requested follow-up call from daughter re: SNF placement s/p hosp discharge     Encounter Outcome:  Pt. Visit Completed   Chong Sicilian, BSN, RN-BC RN Care Coordinator [place]: [phone] Main #: [phone]

## 2022-07-24 NOTE — ED Notes (Addendum)
At this time pt does not have any bed offers. CSW awaiting return call from [facility] admissions, VM left earlier this morning. CSW reached out to [facility] with [facility], awaiting response. TOC to follow.   Addendum 1:15pm: CSW attempted to reach pts daughter to see what other facilities pts referral can be sent to as there are no bed offers at this time. CSW unable to leave VM as VM box is full. TOC to follow.

## 2022-07-24 NOTE — ED Notes (Addendum)
Pt drank half the bottle of Ensure and ate ice cream. Pt calm at this time. Pt kept his eyes closed while eating but constantly talks.

## 2022-07-24 NOTE — ED Notes (Signed)
Pt able to wake up easily and eat several bites of apple sauce with PO meds and took a couple drinks of Ensure

## 2022-07-25 NOTE — ED Notes (Signed)
Received report. Pt sleeping at this time. Chest rise and fall noted.

## 2022-07-25 NOTE — ED Notes (Signed)
Pt did not want any other food at this time.

## 2022-07-25 NOTE — ED Notes (Signed)
Pt. Tolerated taking am meds with applesauce. Was able to get it all down, along with few sips of water.

## 2022-07-25 NOTE — ED Notes (Signed)
Pt was given breakfast tray 

## 2022-07-25 NOTE — ED Notes (Addendum)
CSW attempted to reach pts daughter [name], CSW unable to reach on cell phone and unable to leave VM because of full VM box. CSW left VM requesting return call on home phone for pts daughter [name]. TOC to continue attempting to reach pts daughter.   Addendum 12:30: CSW spoke with pts daughter to update that at this time there are no facilities that have made a bed offer. CSW updated pts daughter that bed search has been widened at this time. TOC to follow.

## 2022-07-25 NOTE — ED Notes (Signed)
This RN fed patient chocolate icecream and he drank a few sips of ensure. Jordan Corona [name]

## 2022-07-25 NOTE — ED Notes (Signed)
Upon this RN's assessment, patient is alert to self and disoriented to place, situation, and time. He is sleepy but does respond to name. Respirations equal and  unlabored. Murmur hear upon auscultation. Pt does not appear to be in any pain or distress. Fluids were offered however, he declines. Jordan [name]

## 2022-07-26 DIAGNOSIS — S51011A Laceration without foreign body of right elbow, initial encounter: Secondary | ICD-10-CM | POA: Diagnosis not present

## 2022-07-26 DIAGNOSIS — F0280 Dementia in other diseases classified elsewhere without behavioral disturbance: Secondary | ICD-10-CM | POA: Diagnosis not present

## 2022-07-26 DIAGNOSIS — R296 Repeated falls: Secondary | ICD-10-CM | POA: Diagnosis not present

## 2022-07-26 DIAGNOSIS — R4182 Altered mental status, unspecified: Secondary | ICD-10-CM | POA: Diagnosis not present

## 2022-07-26 NOTE — ED Provider Notes (Signed)
Emergency Medicine Observation Re-evaluation Note  Jordan [name] is a 89 y.o. male, seen on rounds today.  Pt initially presented to the ED for complaints of Altered Mental Status Currently, the patient is asleep.  Pt initially presented to the ED on 1/5 with falls.  He does have dementia.  Pt's daughter is unable to care for patient any more.  She requests SNF.  SW and PT have eval pt.  SW looking for placement.  Physical Exam  BP (!) 158/94 (BP Location: Left Arm)   Pulse 93   Temp 98.2 F (36.8 C) (Oral)   Resp 18   Ht 5\' 9"  (1.753 m)   Wt 55.7 kg   SpO2 100%   BMI 18.13 kg/m  Physical Exam General: asleep Cardiac: rr Lungs: clear Psych: asleep  ED Course / MDM  EKG:EKG Interpretation  Date/Time:  Friday July 21 2022 12:26:08 EST Ventricular Rate:  85 PR Interval:  219 QRS Duration: 130 QT Interval:  389 QTC Calculation: 463 R Axis:   265 Text Interpretation: Sinus rhythm Borderline prolonged PR interval RBBB and LAFB Confirmed by Lajean Saver [phone]) on 07/21/2022 12:34:42 PM  I have reviewed the labs performed to date as well as medications administered while in observation.  Recent changes in the last 24 hours include none.  Plan  Current plan is for awaiting placement.    Isla Pence, MD 07/26/22 [phone]

## 2022-07-26 NOTE — ED Notes (Signed)
ate 100% of dinner + ensure + snack

## 2022-07-26 NOTE — ED Notes (Signed)
CSW spoke to admissions at [facility] as the [facility] says they are considering. [facility] DON will review and admissions will follow up. CSW updated by admissions with [facility] that they are unable to offer a bed. TOC to follow.

## 2022-07-26 NOTE — ED Notes (Signed)
Pt received breakfast tray 

## 2022-07-26 NOTE — ED Notes (Signed)
Pt's daughter, [name] was here and informed our social worker has been trying to get in touch with her;  verified that we have correct phone numbers

## 2022-07-26 NOTE — ED Notes (Signed)
Pt's dinner tray at bedside; pt sleeping at this time

## 2022-07-27 DIAGNOSIS — F0280 Dementia in other diseases classified elsewhere without behavioral disturbance: Secondary | ICD-10-CM | POA: Diagnosis not present

## 2022-07-27 DIAGNOSIS — R4182 Altered mental status, unspecified: Secondary | ICD-10-CM | POA: Diagnosis not present

## 2022-07-27 DIAGNOSIS — R296 Repeated falls: Secondary | ICD-10-CM | POA: Diagnosis not present

## 2022-07-27 DIAGNOSIS — S51011A Laceration without foreign body of right elbow, initial encounter: Secondary | ICD-10-CM | POA: Diagnosis not present

## 2022-07-27 NOTE — ED Notes (Signed)
Spoke with Patient Daughter-[name]. She states she has reached out to [employer] at [place] and had Dr. Ria Comment office send paperwork. Spoke with Nancy Marus, CM, who has also sent paperwork to [facility] and is awaiting response. Beverlee Nims has also reached back out to other facilities. CM with follow up with Nanine Means and pt daughter tomorrow.

## 2022-07-27 NOTE — ED Notes (Signed)
Meds given with icecream. Franco Nones

## 2022-07-27 NOTE — Progress Notes (Signed)
CM spoke with daughter, who declines the bed offer from [facility].  CM explained that this was the only bed offer and at this time there are no facilities willing to accept patient for services.  Explained that TOC has contacted all facilities and expanded search to neighboring counties with no offers.  Explained that at this time the remaining option is for patient to go home with family.  Daughter hung up the phone.

## 2022-07-27 NOTE — Progress Notes (Signed)
TOC faxed referral and clinical information to [facility] for review.

## 2022-07-27 NOTE — Progress Notes (Signed)
HIPAA complaint vm left for daughter requesting call back regarding bed offer.

## 2022-07-27 NOTE — Progress Notes (Signed)
TOC was able to confirm that [facility] has extended a bed offer, bed offer was presented to patient's daughter who reports that she will research the facility and call this writer back with her decision.

## 2022-07-27 NOTE — Progress Notes (Signed)
Physical Therapy Treatment Patient Details Name: Jordan [name] MRN: [id number] DOB: October 17, 1922 Today's Date: 07/27/2022   History of Present Illness Jordan [name] is a 89 y.o. male.     Pt with hx dementia, with general weakness and several recent falls. Pt limited historian - level 5 caveat. Recent admission noted w similar symptoms. No report of fevers. No anticoagulant use. No specific physical complaints or other new/specific symptoms noted other than few superficial skin tears. Tetanus up to date.      The history is provided by the patient, the EMS personnel and medical records. The history is limited by the condition of the patient.    PT Comments    Patient presents alert and able to follow most directions consistently with verbal/tactile cueing.  Patient demonstrates slow labored movement for sitting up at bedside requiring verbal/tactile cueing for proper hand placement with fair carryover, very unsteady on feet and limited to a few side steps and steps forward/backward before having to sit due to BLE weakness and fatigue.  Patient tolerated sitting up in chair after therapy - RN aware.  Patient will benefit from continued skilled physical therapy in hospital and recommended venue below to increase strength, balance, endurance for safe ADLs and gait.      Recommendations for follow up therapy are one component of a multi-disciplinary discharge planning process, led by the attending physician.  Recommendations may be updated based on patient status, additional functional criteria and insurance authorization.  Follow Up Recommendations  Skilled nursing-short term rehab (<3 hours/day) Can patient physically be transported by private vehicle: No   Assistance Recommended at Discharge Frequent or constant Supervision/Assistance  Patient can return home with the following A lot of help with walking and/or transfers;A lot of help with bathing/dressing/bathroom;Help with stairs  or ramp for entrance;Assistance with cooking/housework   Equipment Recommendations  None recommended by PT    Recommendations for Other Services       Precautions / Restrictions Precautions Precautions: Fall Restrictions Weight Bearing Restrictions: No     Mobility  Bed Mobility Overal bed mobility: Needs Assistance Bed Mobility: Supine to Sit Rolling: Mod assist              Transfers Overall transfer level: Needs assistance Equipment used: Rolling walker (2 wheels) Transfers: Sit to/from Stand, Bed to chair/wheelchair/BSC Sit to Stand: Mod assist Stand pivot transfers: Mod assist         General transfer comment: Slow labored movement with buckling of knees due to weakness    Ambulation/Gait Ambulation/Gait assistance: Mod assist, Max assist Gait Distance (Feet): 5 Feet Assistive device: Rolling walker (2 wheels) Gait Pattern/deviations: Decreased step length - right, Decreased step length - left, Decreased stride length, Knees buckling, Shuffle Gait velocity: slow     General Gait Details: limited to a few steps forwad/backwards and side stepping due to BLE weakness with buckling of knees, shuffling of feet   Stairs             Wheelchair Mobility    Modified Rankin (Stroke Patients Only)       Balance Overall balance assessment: Needs assistance Sitting-balance support: Feet supported, Bilateral upper extremity supported Sitting balance-Leahy Scale: Fair Sitting balance - Comments: seated at EOB bed   Standing balance support: Reliant on assistive device for balance, No upper extremity supported, During functional activity Standing balance-Leahy Scale: Poor Standing balance comment: using RW  Cognition Arousal/Alertness: Awake/alert Behavior During Therapy: WFL for tasks assessed/performed Overall Cognitive Status: Within Functional Limits for tasks assessed                                           Exercises General Exercises - Lower Extremity Ankle Circles/Pumps: AAROM, Strengthening, Both, 10 reps, Seated Long Arc Quad: AROM, Strengthening, Both, 10 reps, Seated Hip Flexion/Marching: Seated, 10 reps, Both, Strengthening, AROM    General Comments        Pertinent Vitals/Pain Pain Assessment Pain Assessment: Faces Faces Pain Scale: Hurts a little bit Pain Location: BUE/LE with pressure Pain Descriptors / Indicators: Discomfort, Grimacing Pain Intervention(s): Limited activity within patient's tolerance, Monitored during session, Repositioned    Home Living                          Prior Function            PT Goals (current goals can now be found in the care plan section) Acute Rehab PT Goals Patient Stated Goal: return home PT Goal Formulation: With patient Time For Goal Achievement: 08/05/22 Potential to Achieve Goals: Good Progress towards PT goals: Progressing toward goals    Frequency    Min 2X/week      PT Plan      Co-evaluation              AM-PAC PT "6 Clicks" Mobility   Outcome Measure  Help needed turning from your back to your side while in a flat bed without using bedrails?: A Lot Help needed moving from lying on your back to sitting on the side of a flat bed without using bedrails?: A Lot Help needed moving to and from a bed to a chair (including a wheelchair)?: A Lot Help needed standing up from a chair using your arms (e.g., wheelchair or bedside chair)?: A Lot Help needed to walk in hospital room?: A Lot Help needed climbing 3-5 steps with a railing? : Total 6 Click Score: 11    End of Session   Activity Tolerance: Patient tolerated treatment well;Patient limited by fatigue Patient left: in chair;with call bell/phone within reach;with chair alarm set Nurse Communication: Mobility status PT Visit Diagnosis: Unsteadiness on feet (R26.81);Other abnormalities of gait and mobility (R26.89);Muscle  weakness (generalized) (M62.81)     Time: 0233-4356 PT Time Calculation (min) (ACUTE ONLY): 30 min  Charges:  $Therapeutic Exercise: 8-22 mins $Therapeutic Activity: 8-22 mins                     2:27 PM, 07/27/22 Lonell Grandchild, MPT Physical Therapist with [facility] 336 [phone] office [phone] mobile phone

## 2022-07-27 NOTE — Progress Notes (Signed)
TOC noted a bed offer in [facility] from [facility], called and left VM for facility rep requesting call back to verify if offer is still available, awaiting call back.

## 2022-07-27 NOTE — ED Provider Notes (Signed)
Emergency Medicine Observation Re-evaluation Note  Jordan [name] is a 89 y.o. male, seen on rounds today.  Pt initially presented to the ED for complaints of Altered Mental Status Currently, the patient is resting comfortably in bed.  Pleasantly demented  Physical Exam  BP (!) 147/69 (BP Location: Left Arm)   Pulse 83   Temp 97.6 F (36.4 C) (Oral)   Resp 16   Ht 5\' 9"  (1.753 m)   Wt 55.7 kg   SpO2 100%   BMI 18.13 kg/m  Physical Exam Vitals and nursing note reviewed.  Constitutional:      General: He is not in acute distress.    Appearance: He is well-developed.  HENT:     Head: Normocephalic and atraumatic.  Eyes:     Conjunctiva/sclera: Conjunctivae normal.  Cardiovascular:     Rate and Rhythm: Normal rate and regular rhythm.     Heart sounds: No murmur heard. Pulmonary:     Effort: Pulmonary effort is normal. No respiratory distress.  Musculoskeletal:        General: No swelling.     Cervical back: Neck supple.  Skin:    General: Skin is warm and dry.  Neurological:     Mental Status: He is alert.  Psychiatric:        Mood and Affect: Mood normal.      ED Course / MDM  EKG:EKG Interpretation  Date/Time:  Friday July 21 2022 12:26:08 EST Ventricular Rate:  85 PR Interval:  219 QRS Duration: 130 QT Interval:  389 QTC Calculation: 463 R Axis:   265 Text Interpretation: Sinus rhythm Borderline prolonged PR interval RBBB and LAFB Confirmed by Lajean Saver [phone]) on 07/21/2022 12:34:42 PM  I have reviewed the labs performed to date as well as medications administered while in observation.  Recent changes in the last 24 hours include continued social work evaluation.  Plan  Current plan is for placement.    Teressa Lower, MD 07/27/22 1049

## 2022-07-27 NOTE — ED Notes (Signed)
Patient ate 100% of his dinner and 100% of his desert. He also drank 100% of his ensure/milkshake mix. Nurse notified.

## 2022-07-27 NOTE — ED Notes (Signed)
Pt ambulated from ED RM4 recliner to restroom with 2 person assist and walker. He needed guidance with walker but overall tolerated very well. He was assisted to bed and was offered his meal tray. He reports he would try to eat a little. NT assisting with feeding. Jordan Corona [name]

## 2022-07-27 NOTE — ED Notes (Signed)
Pt resting quietly with eyes closed and non-labored respirations. Bed in low locked position with side rails up x2, call bell in reach. No s/s of acute distress. Pt room in view of nurses station

## 2022-07-28 MED ORDER — LORAZEPAM 0.5 MG PO TABS
0.5000 mg | ORAL_TABLET | Freq: Once | ORAL | Status: AC
  Administered 2022-07-28: 0.5 mg via ORAL
  Filled 2022-07-28: qty 1

## 2022-07-28 NOTE — ED Notes (Signed)
Pt used urinal. Repositioned, warm blankets provided.

## 2022-07-28 NOTE — Progress Notes (Signed)
CSW reached out to daughter to explain bed offers. [name] TOC will continue to follow.

## 2022-07-28 NOTE — ED Notes (Signed)
Pt sat up for breakfast

## 2022-07-28 NOTE — ED Notes (Signed)
Pt resting, NAD noted, observed even RR and unlabored, side rails up x2 for safety, plan of care ongoing, pt is within view of staff for safety monitoring, no further concerns as of present.

## 2022-07-28 NOTE — Progress Notes (Signed)
Patient's daughter, Burna Mortimer, contacted and indicated that she spoke with Luanna Cole at [facility] regarding potential placement. Advised that a referred had been sent to facility on 1/9 and was declined by [facility]. Message left for Brianna. Follow up message left for daughter, Burna Mortimer, advising that message had been left for Brianna.

## 2022-07-28 NOTE — ED Notes (Signed)
Pt ate container of apple sauce with medication and took a few sips of water w/o difficulty

## 2022-07-28 NOTE — Progress Notes (Signed)
VM left for [facility] rep requesting follow up on referral/ clinicals that were faxed yesterday.

## 2022-07-28 NOTE — ED Notes (Signed)
Patient assisted back to the bed and turned to watch the tv at this time.

## 2022-07-28 NOTE — ED Notes (Addendum)
Pt resting comfortably

## 2022-07-28 NOTE — Progress Notes (Signed)
[name] is unable to accept patient into their facility at this time, rep reports that this information has been relayed to the patients daughter.    CM also followed up again with [facility]hab and [facility], per rep [facility] is not an option and rep reports that previously the daughter has expressed that she did not want [facility].  Rep states she can ask the facility for another review if [facility] can confirm with the daughter that she is no longer opposed to [facility].  CM reached out to daughter and left a HIPAA compliant VM requesting call back.

## 2022-07-28 NOTE — Progress Notes (Signed)
[facility] has responded, they have no open beds.

## 2022-07-28 NOTE — Progress Notes (Signed)
CM reached back out to [facility], [facility], [facility], [facility], [facility].  While all facilities had previously declined patient, CM requested a reconsideration.  VM left for [facility] and [facility], spoke directly with admission reps for the other facilities, reps will take another look at clinicals and update CM on decision.

## 2022-07-28 NOTE — ED Notes (Signed)
Pt placed in a recliner at this time.

## 2022-07-29 DIAGNOSIS — R4182 Altered mental status, unspecified: Secondary | ICD-10-CM | POA: Diagnosis not present

## 2022-07-29 DIAGNOSIS — S51011A Laceration without foreign body of right elbow, initial encounter: Secondary | ICD-10-CM | POA: Diagnosis not present

## 2022-07-29 DIAGNOSIS — R296 Repeated falls: Secondary | ICD-10-CM | POA: Diagnosis not present

## 2022-07-29 DIAGNOSIS — F0280 Dementia in other diseases classified elsewhere without behavioral disturbance: Secondary | ICD-10-CM | POA: Diagnosis not present

## 2022-07-29 LAB — CBG MONITORING, ED: Glucose-Capillary: 116 mg/dL — ABNORMAL HIGH (ref 70–99)

## 2022-07-29 NOTE — ED Notes (Signed)
RN to room d/t bed alarm going off, pt trying to get out of bed and laying sideways in bed. Pt assisted in getting up and into recliner with walker and X 1 assist. Redirection provided. Chair alarm on and activated. Brief changed. Linens changed. Pt tolerated well. No other needs expressed at this time. Call light within reach. Pt continues to refuse to eat or drink ensure.

## 2022-07-29 NOTE — ED Notes (Signed)
Patient ate about 50% of his lunch. Patient ate all of his pudding, drank his ensure, and his sweet tea. Patient seemed a little more fatigue than normal. Nurse notified.

## 2022-07-29 NOTE — ED Notes (Signed)
Patient not wanting to wake up and eat breakfast at this time. Nurse notified.

## 2022-07-29 NOTE — ED Notes (Signed)
Patient called out. When this nurse tech entered the room patient was trying to slide out of bed asking for "the nurse to give me my new medication". Patient seems agitated at this time.  This nurse tech helped patient back up into bed and provided warm blankets. This nurse tech informed patient of breakfast coming soon and to call out with any needs that may arise. Nurse notified at this time.

## 2022-07-29 NOTE — ED Notes (Signed)
Daughter came to visit with patient. Daughter requesting update on patient status and plan of care. SW made aware.

## 2022-07-29 NOTE — ED Provider Notes (Signed)
Emergency Medicine Observation Re-evaluation Note  Jordan [name] is a 89 y.o. male, seen on rounds today.  Pt initially presented to the ED for complaints of Altered Mental Status Currently, the patient is restless.  Physical Exam  BP (!) 143/56 (BP Location: Right Arm)   Pulse 64   Temp 98 F (36.7 C) (Oral)   Resp 14   Ht 5\' 9"  (1.753 m)   Wt 55.7 kg   SpO2 96%   BMI 18.13 kg/m  Physical Exam General: No acute distress Cardiac: Well-perfused Lungs: Nonlabored Psych: Agitated redirectable  ED Course / MDM  EKG:EKG Interpretation  Date/Time:  Friday July 21 2022 12:26:08 EST Ventricular Rate:  85 PR Interval:  219 QRS Duration: 130 QT Interval:  389 QTC Calculation: 463 R Axis:   265 Text Interpretation: Sinus rhythm Borderline prolonged PR interval RBBB and LAFB Confirmed by 09-17-1992 (Cathren Laine) on 07/21/2022 12:34:42 PM  I have reviewed the labs performed to date as well as medications administered while in observation.  Recent changes in the last 24 hours include social work working on placement.  Plan  Current plan is for placement.    09/19/2022, MD 07/29/22 [phone]

## 2022-07-29 NOTE — ED Notes (Signed)
Pt resting quietly in bed. Respirations even and unlabored. No S/S of distress noted.

## 2022-07-29 NOTE — ED Notes (Signed)
Pt appears to be comfortable and resting, can observe even RR that are unlabored, no changes noted, side rails up x2 for safety, pt remains within view of medical staff for added safety, NAD noted, blankets remains on pt for warmth, bed in lowest position, no further concerns as of present.

## 2022-07-29 NOTE — ED Notes (Signed)
Pt placed back to bed with X 2 assist. Tolerated well.

## 2022-07-30 NOTE — ED Notes (Signed)
Pt ate 4oz of apple sauce

## 2022-07-31 DIAGNOSIS — R4182 Altered mental status, unspecified: Secondary | ICD-10-CM | POA: Diagnosis not present

## 2022-07-31 DIAGNOSIS — F0280 Dementia in other diseases classified elsewhere without behavioral disturbance: Secondary | ICD-10-CM | POA: Diagnosis not present

## 2022-07-31 DIAGNOSIS — R296 Repeated falls: Secondary | ICD-10-CM | POA: Diagnosis not present

## 2022-07-31 DIAGNOSIS — S51011A Laceration without foreign body of right elbow, initial encounter: Secondary | ICD-10-CM | POA: Diagnosis not present

## 2022-07-31 NOTE — Progress Notes (Signed)
This CSW attempted to contact the pt's daughter without success. Left HIPAA Compliant voicemail.

## 2022-07-31 NOTE — Progress Notes (Signed)
CM followed up with [name] who report that at this time the administrator has not yet reviewed the referral, however due to construction on site there are no open beds at this time.   CM called and left a VM for daughter Jordan [name] requesting call back to provide this update.

## 2022-07-31 NOTE — ED Provider Notes (Signed)
Emergency Medicine Observation Re-evaluation Note  Jordan [name] is a 89 y.o. male, seen on rounds today.  Pt initially presented to the ED for complaints of Altered Mental Status Currently, the patient is resting quietly.  Physical Exam  BP (!) 150/58   Pulse 78   Temp 98.6 F (37 C)   Resp 14   Ht 5\' 9"  (1.753 m)   Wt 55.7 kg   SpO2 100%   BMI 18.13 kg/m  Physical Exam General: No acute distress Cardiac: Well-perfused Lungs: Nonlabored Psych: Cooperative  ED Course / MDM  EKG:EKG Interpretation  Date/Time:  Friday July 21 2022 12:26:08 EST Ventricular Rate:  85 PR Interval:  219 QRS Duration: 130 QT Interval:  389 QTC Calculation: 463 R Axis:   265 Text Interpretation: Sinus rhythm Borderline prolonged PR interval RBBB and LAFB Confirmed by 09-17-1992 (Cathren Laine) on 07/21/2022 12:34:42 PM  I have reviewed the labs performed to date as well as medications administered while in observation.  Recent changes in the last 24 hours include social work still working on placement.  Plan  Current plan is for placement.    09/19/2022, MD 07/31/22 [phone]

## 2022-07-31 NOTE — ED Notes (Signed)
Pt assisted with urinal at this time.

## 2022-07-31 NOTE — Progress Notes (Signed)
CM had a lengthy conversation with daughter regarding discharge planning, went over the bed denial from [facility] and their feedback that patient would need to rehab before they can consider him, as well as no memory care beds being currently available.  Discussed that TOC outreached to area snf in [place] and [place] again to request reconsideration of denials and thus far no new bed offers.  Discussed that [facility] rehab is willing to take a second look at referral but wanted confirmation that daughter would be in agreement with this as they were previously under the impression that Jordan [name] was opposed to this placement.  Did explain that at this time the only bed offer is [facility] in [place].  [name] reports that she is open to [facility] reviewing the patient again.    CM spoke with facility rep, Revonda Standard, who will escalate the case to her administrator for review and update TOC on decision.

## 2022-07-31 NOTE — Progress Notes (Signed)
TOC called daughter on all listed phone numbers again and left another VM requesting call back.

## 2022-07-31 NOTE — ED Notes (Signed)
Sacral foam placed over upper thoracic vertebrae. Area is red but blanchable at this time

## 2022-07-31 NOTE — ED Notes (Signed)
Pt bed alarm went off, pt attempted to get out of bed. Tech and RN assisted pt to get back in the bed, repositioned. Pt used urinal.

## 2022-08-01 NOTE — Progress Notes (Addendum)
TOC called daughter x4 today.  VM left on cell phone and home phone requesting call back.  TOC was able to reach granddaughter Kaleen Mask, who reports she will try to reach her mother and ask her to call TOC back.

## 2022-08-01 NOTE — ED Notes (Signed)
Patient ate entire meal plate. 480%. Patient drank a milkshake and drank of apple juice. Nurse notified.

## 2022-08-01 NOTE — ED Notes (Signed)
Pt transitioned from recliner to bed via walker and 2 man assist.

## 2022-08-01 NOTE — Progress Notes (Signed)
CM spoke with daughter Burna Mortimer, who has decided to accept the bed offer from [facility] in [place]. CM called facility to confirm selection, left HIPAA complaint VM for admissions with request to call back.

## 2022-08-01 NOTE — Progress Notes (Signed)
CM spoke with [employer] rep and informed her that family has selected their facility for SNF rehab.  Rep will initiate authorization and update TOC when approved.

## 2022-08-01 NOTE — Progress Notes (Signed)
CM received call from [employer], patient's insurance information does not match.  CM spoke with daughter and confirmed that patient received a new insurance card in the mail, daughter reports she will need to go to him home to pick this up.  Daughter will either bring a copy to the hospital or send a copy to TOC.  TOC awaiting updated insurance info before we can proceed with auth request.

## 2022-08-02 DIAGNOSIS — R4182 Altered mental status, unspecified: Secondary | ICD-10-CM | POA: Diagnosis not present

## 2022-08-02 DIAGNOSIS — N390 Urinary tract infection, site not specified: Secondary | ICD-10-CM | POA: Diagnosis not present

## 2022-08-02 DIAGNOSIS — R296 Repeated falls: Secondary | ICD-10-CM | POA: Diagnosis not present

## 2022-08-02 DIAGNOSIS — F0280 Dementia in other diseases classified elsewhere without behavioral disturbance: Secondary | ICD-10-CM | POA: Diagnosis not present

## 2022-08-02 LAB — CBC WITH DIFFERENTIAL/PLATELET
Abs Immature Granulocytes: 0.02 10*3/uL (ref 0.00–0.07)
Basophils Absolute: 0 10*3/uL (ref 0.0–0.1)
Basophils Relative: 0 %
Eosinophils Absolute: 0.2 10*3/uL (ref 0.0–0.5)
Eosinophils Relative: 2 %
HCT: 38.6 % — ABNORMAL LOW (ref 39.0–52.0)
Hemoglobin: 12.2 g/dL — ABNORMAL LOW (ref 13.0–17.0)
Immature Granulocytes: 0 %
Lymphocytes Relative: 18 %
Lymphs Abs: 1.8 10*3/uL (ref 0.7–4.0)
MCH: 29 pg (ref 26.0–34.0)
MCHC: 31.6 g/dL (ref 30.0–36.0)
MCV: 91.9 fL (ref 80.0–100.0)
Monocytes Absolute: 0.7 10*3/uL (ref 0.1–1.0)
Monocytes Relative: 7 %
Neutro Abs: 7.4 10*3/uL (ref 1.7–7.7)
Neutrophils Relative %: 73 %
Platelets: 465 10*3/uL — ABNORMAL HIGH (ref 150–400)
RBC: 4.2 MIL/uL — ABNORMAL LOW (ref 4.22–5.81)
RDW: 13.2 % (ref 11.5–15.5)
WBC: 10.1 10*3/uL (ref 4.0–10.5)
nRBC: 0 % (ref 0.0–0.2)

## 2022-08-02 LAB — BASIC METABOLIC PANEL
Anion gap: 10 (ref 5–15)
BUN: 40 mg/dL — ABNORMAL HIGH (ref 8–23)
CO2: 29 mmol/L (ref 22–32)
Calcium: 8.9 mg/dL (ref 8.9–10.3)
Chloride: 98 mmol/L (ref 98–111)
Creatinine, Ser: 1.19 mg/dL (ref 0.61–1.24)
GFR, Estimated: 56 mL/min — ABNORMAL LOW (ref >60)
Glucose, Bld: 142 mg/dL — ABNORMAL HIGH (ref 70–99)
Potassium: 3.9 mmol/L (ref 3.5–5.1)
Sodium: 137 mmol/L (ref 135–145)

## 2022-08-02 LAB — URINALYSIS, ROUTINE W REFLEX MICROSCOPIC
Bilirubin Urine: NEGATIVE
Glucose, UA: NEGATIVE mg/dL
Hgb urine dipstick: NEGATIVE
Ketones, ur: NEGATIVE mg/dL
Nitrite: POSITIVE — AB
Protein, ur: NEGATIVE mg/dL
Specific Gravity, Urine: 1.016 (ref 1.005–1.030)
WBC, UA: >50 WBC/hpf — ABNORMAL HIGH (ref 0–5)
pH: 6 (ref 5.0–8.0)

## 2022-08-02 MED ORDER — CEPHALEXIN 500 MG PO CAPS
500.0000 mg | ORAL_CAPSULE | Freq: Three times a day (TID) | ORAL | Status: DC
  Administered 2022-08-02 – 2022-08-03 (×5): 500 mg via ORAL
  Filled 2022-08-02 (×5): qty 1

## 2022-08-02 NOTE — ED Notes (Signed)
Pt is sitting in recliner, has just finished his dinner, not complaining of anything at this time.

## 2022-08-02 NOTE — ED Notes (Signed)
Sacral foam applied to buttocks and to upper back.

## 2022-08-02 NOTE — ED Notes (Signed)
Sheets on stretcher changed

## 2022-08-02 NOTE — ED Notes (Signed)
Pt was helped up to recliner by PT.  While pt up he was given meal.  Pt requests to be fed, able to eat entire pasta and chicken portion and drank one Ensure plus

## 2022-08-02 NOTE — ED Notes (Signed)
Patient drinking ensure at this time.

## 2022-08-02 NOTE — Progress Notes (Signed)
CM outreached to daughter Burna Mortimer, unable to leave VM as mailbox is full, still awaiting new insurance card, facility reports unable to start auth until insurance can be verified.

## 2022-08-02 NOTE — ED Notes (Signed)
Patient incontinent of urine. Bedside cleaning done and new brief placed.

## 2022-08-02 NOTE — ED Notes (Signed)
Pt had bed bath, medications given, placed back in bed.

## 2022-08-02 NOTE — ED Notes (Signed)
Patient attempting to get out of bed. Stating "I gotta go back to work." Patient repositioned back in bed. Bed alarm still on. Call light within reach.

## 2022-08-02 NOTE — ED Provider Notes (Signed)
Emergency Medicine Observation Re-evaluation Note  Jordan [name] is a 89 y.o. male, seen on rounds today.  Pt initially presented to the ED for complaints of Altered Mental Status Currently, the patient is sleeping in bed. Easily arousable and denies complaints.  Physical Exam  BP (!) 143/56 (BP Location: Left Arm)   Pulse 78   Temp 98.3 F (36.8 C) (Oral)   Resp 16   Ht 5\' 9"  (1.753 m)   Wt 55.7 kg   SpO2 99%   BMI 18.13 kg/m  Physical Exam General: No distress Cardiac: Regular rate Lungs: Clear Psych: calm  ED Course / MDM  EKG:EKG Interpretation  Date/Time:  Friday July 21 2022 12:26:08 EST Ventricular Rate:  85 PR Interval:  219 QRS Duration: 130 QT Interval:  389 QTC Calculation: 463 R Axis:   265 Text Interpretation: Sinus rhythm Borderline prolonged PR interval RBBB and LAFB Confirmed by 09-17-1992 (Cathren Laine) on 07/21/2022 12:34:42 PM  I have reviewed the labs performed to date as well as medications administered while in observation.  Recent changes in the last 24 hours include noted to have foul smelling urine last night, UA concerning for infectious process with +WBC, nitrites. Keflex ordered.  Plan  Current plan is for placement. Will treat suspected UTI with keflex. Urine culture has been ordered.    09/19/2022, MD 08/02/22 [phone]

## 2022-08-02 NOTE — ED Notes (Signed)
Pt needed to urinate. Pt assisted to use bedside urinal. Pt's urine is malodorous. EDP gave verbal permission to run a UA.

## 2022-08-02 NOTE — Progress Notes (Signed)
Physical Therapy Treatment Patient Details Name: Jordan [name] MRN: [id number] DOB: Sep 10, 1922 Today's Date: 08/02/2022   History of Present Illness Asheton Scheffler Caba is a 89 y.o. male.     Pt with hx dementia, with general weakness and several recent falls. Pt limited historian - level 5 caveat. Recent admission noted w similar symptoms. No report of fevers. No anticoagulant use. No specific physical complaints or other new/specific symptoms noted other than few superficial skin tears. Tetanus up to date.      The history is provided by the patient, the EMS personnel and medical records. The history is limited by the condition of the patient.    PT Comments    Patient demonstrates increased endurance/distance for gait training with slow labored movement and frequent posterior leaning requiring repeated verbal/tactile cueing for safety to avoid patient from falling backwards.  Patient demonstrates fair return for completing exercises while seated at EOB requiring verbal cueing and demonstration.  Patient tolerated sitting up in chair after therapy - RN notified.  Patient will benefit from continued skilled physical therapy in hospital and recommended venue below to increase strength, balance, endurance for safe ADLs and gait.     Recommendations for follow up therapy are one component of a multi-disciplinary discharge planning process, led by the attending physician.  Recommendations may be updated based on patient status, additional functional criteria and insurance authorization.  Follow Up Recommendations  Skilled nursing-short term rehab (<3 hours/day) Can patient physically be transported by private vehicle: No   Assistance Recommended at Discharge Frequent or constant Supervision/Assistance  Patient can return home with the following A lot of help with walking and/or transfers;A lot of help with bathing/dressing/bathroom;Help with stairs or ramp for entrance;Assistance with  cooking/housework   Equipment Recommendations  None recommended by PT    Recommendations for Other Services       Precautions / Restrictions Precautions Precautions: Fall Restrictions Weight Bearing Restrictions: No     Mobility  Bed Mobility Overal bed mobility: Needs Assistance Bed Mobility: Supine to Sit Rolling: Mod assist         General bed mobility comments: increased time, labored movement, limited use of BUE due to weakness    Transfers Overall transfer level: Needs assistance Equipment used: Rolling walker (2 wheels) Transfers: Sit to/from Stand, Bed to chair/wheelchair/BSC Sit to Stand: Min assist, Mod assist Stand pivot transfers: Min assist, Mod assist Step pivot transfers: Min assist, Mod assist       General transfer comment: unsteady labored movement    Ambulation/Gait Ambulation/Gait assistance: Mod assist Gait Distance (Feet): 20 Feet Assistive device: Rolling walker (2 wheels) Gait Pattern/deviations: Decreased step length - right, Decreased step length - left, Decreased stride length, Staggering left, Staggering right, Narrow base of support       General Gait Details: slow labored movement with frequent posterior leaning requiring repeated verbal/tactile cueing for safety   Stairs             Wheelchair Mobility    Modified Rankin (Stroke Patients Only)       Balance Overall balance assessment: Needs assistance Sitting-balance support: Feet unsupported, No upper extremity supported Sitting balance-Leahy Scale: Fair Sitting balance - Comments: seated at EOB   Standing balance support: Reliant on assistive device for balance, During functional activity, Bilateral upper extremity supported Standing balance-Leahy Scale: Poor Standing balance comment: using RW  Cognition Arousal/Alertness: Awake/alert Behavior During Therapy: WFL for tasks assessed/performed Overall Cognitive Status:  Within Functional Limits for tasks assessed                                          Exercises General Exercises - Lower Extremity Ankle Circles/Pumps: AAROM, Strengthening, Both, 10 reps, Seated Long Arc Quad: AROM, Strengthening, Both, 10 reps, Seated Hip Flexion/Marching: Seated, 10 reps, Both, Strengthening, AROM    General Comments        Pertinent Vitals/Pain Pain Assessment Pain Assessment: No/denies pain    Home Living                          Prior Function            PT Goals (current goals can now be found in the care plan section) Acute Rehab PT Goals Patient Stated Goal: return home PT Goal Formulation: With patient Time For Goal Achievement: 08/05/22 Potential to Achieve Goals: Good Progress towards PT goals: Progressing toward goals    Frequency    Min 2X/week      PT Plan Current plan remains appropriate    Co-evaluation              AM-PAC PT "6 Clicks" Mobility   Outcome Measure  Help needed turning from your back to your side while in a flat bed without using bedrails?: A Lot Help needed moving from lying on your back to sitting on the side of a flat bed without using bedrails?: A Lot Help needed moving to and from a bed to a chair (including a wheelchair)?: A Lot Help needed standing up from a chair using your arms (e.g., wheelchair or bedside chair)?: A Lot Help needed to walk in hospital room?: A Lot Help needed climbing 3-5 steps with a railing? : Total 6 Click Score: 11    End of Session   Activity Tolerance: Patient tolerated treatment well Patient left: in chair;with call bell/phone within reach;with chair alarm set Nurse Communication: Mobility status PT Visit Diagnosis: Unsteadiness on feet (R26.81);Other abnormalities of gait and mobility (R26.89);Muscle weakness (generalized) (M62.81)     Time: 8387-0658 PT Time Calculation (min) (ACUTE ONLY): 20 min  Charges:  $Gait Training: 8-22  mins $Therapeutic Exercise: 8-22 mins                     4:12 PM, 08/02/22 Ocie Bob, MPT Physical Therapist with [facility] [phone] office [phone] mobile phone

## 2022-08-03 DIAGNOSIS — Z87891 Personal history of nicotine dependence: Secondary | ICD-10-CM | POA: Diagnosis not present

## 2022-08-03 DIAGNOSIS — E785 Hyperlipidemia, unspecified: Secondary | ICD-10-CM | POA: Diagnosis not present

## 2022-08-03 DIAGNOSIS — S59909A Unspecified injury of unspecified elbow, initial encounter: Secondary | ICD-10-CM | POA: Diagnosis not present

## 2022-08-03 DIAGNOSIS — F03918 Unspecified dementia, unspecified severity, with other behavioral disturbance: Secondary | ICD-10-CM | POA: Diagnosis not present

## 2022-08-03 DIAGNOSIS — E118 Type 2 diabetes mellitus with unspecified complications: Secondary | ICD-10-CM | POA: Diagnosis not present

## 2022-08-03 DIAGNOSIS — Z66 Do not resuscitate: Secondary | ICD-10-CM | POA: Diagnosis not present

## 2022-08-03 DIAGNOSIS — Z79899 Other long term (current) drug therapy: Secondary | ICD-10-CM | POA: Diagnosis not present

## 2022-08-03 DIAGNOSIS — E440 Moderate protein-calorie malnutrition: Secondary | ICD-10-CM | POA: Diagnosis not present

## 2022-08-03 DIAGNOSIS — F0280 Dementia in other diseases classified elsewhere without behavioral disturbance: Secondary | ICD-10-CM | POA: Diagnosis not present

## 2022-08-03 DIAGNOSIS — S51019A Laceration without foreign body of unspecified elbow, initial encounter: Secondary | ICD-10-CM | POA: Diagnosis not present

## 2022-08-03 DIAGNOSIS — Z8249 Family history of ischemic heart disease and other diseases of the circulatory system: Secondary | ICD-10-CM | POA: Diagnosis not present

## 2022-08-03 DIAGNOSIS — R4182 Altered mental status, unspecified: Secondary | ICD-10-CM | POA: Diagnosis not present

## 2022-08-03 DIAGNOSIS — Z20822 Contact with and (suspected) exposure to covid-19: Secondary | ICD-10-CM | POA: Diagnosis not present

## 2022-08-03 DIAGNOSIS — J432 Centrilobular emphysema: Secondary | ICD-10-CM | POA: Diagnosis not present

## 2022-08-03 DIAGNOSIS — N390 Urinary tract infection, site not specified: Secondary | ICD-10-CM | POA: Diagnosis not present

## 2022-08-03 DIAGNOSIS — S7290XA Unspecified fracture of unspecified femur, initial encounter for closed fracture: Secondary | ICD-10-CM | POA: Diagnosis present

## 2022-08-03 DIAGNOSIS — Z043 Encounter for examination and observation following other accident: Secondary | ICD-10-CM | POA: Diagnosis not present

## 2022-08-03 DIAGNOSIS — S0990XA Unspecified injury of head, initial encounter: Secondary | ICD-10-CM | POA: Diagnosis not present

## 2022-08-03 DIAGNOSIS — W19XXXA Unspecified fall, initial encounter: Secondary | ICD-10-CM | POA: Diagnosis not present

## 2022-08-03 DIAGNOSIS — K52832 Lymphocytic colitis: Secondary | ICD-10-CM | POA: Diagnosis not present

## 2022-08-03 DIAGNOSIS — S72141A Displaced intertrochanteric fracture of right femur, initial encounter for closed fracture: Secondary | ICD-10-CM | POA: Diagnosis not present

## 2022-08-03 DIAGNOSIS — E11649 Type 2 diabetes mellitus with hypoglycemia without coma: Secondary | ICD-10-CM | POA: Diagnosis not present

## 2022-08-03 DIAGNOSIS — S51811A Laceration without foreign body of right forearm, initial encounter: Secondary | ICD-10-CM | POA: Diagnosis not present

## 2022-08-03 DIAGNOSIS — Z961 Presence of intraocular lens: Secondary | ICD-10-CM | POA: Diagnosis present

## 2022-08-03 DIAGNOSIS — E1169 Type 2 diabetes mellitus with other specified complication: Secondary | ICD-10-CM | POA: Diagnosis not present

## 2022-08-03 DIAGNOSIS — I959 Hypotension, unspecified: Secondary | ICD-10-CM | POA: Diagnosis not present

## 2022-08-03 DIAGNOSIS — K5730 Diverticulosis of large intestine without perforation or abscess without bleeding: Secondary | ICD-10-CM | POA: Diagnosis not present

## 2022-08-03 DIAGNOSIS — Z7982 Long term (current) use of aspirin: Secondary | ICD-10-CM | POA: Diagnosis not present

## 2022-08-03 DIAGNOSIS — E1165 Type 2 diabetes mellitus with hyperglycemia: Secondary | ICD-10-CM | POA: Diagnosis not present

## 2022-08-03 DIAGNOSIS — Y92128 Other place in nursing home as the place of occurrence of the external cause: Secondary | ICD-10-CM | POA: Diagnosis not present

## 2022-08-03 DIAGNOSIS — K859 Acute pancreatitis without necrosis or infection, unspecified: Secondary | ICD-10-CM | POA: Diagnosis not present

## 2022-08-03 DIAGNOSIS — I110 Hypertensive heart disease with heart failure: Secondary | ICD-10-CM | POA: Diagnosis not present

## 2022-08-03 DIAGNOSIS — N400 Enlarged prostate without lower urinary tract symptoms: Secondary | ICD-10-CM | POA: Diagnosis not present

## 2022-08-03 DIAGNOSIS — F0390 Unspecified dementia without behavioral disturbance: Secondary | ICD-10-CM | POA: Diagnosis not present

## 2022-08-03 DIAGNOSIS — I5032 Chronic diastolic (congestive) heart failure: Secondary | ICD-10-CM | POA: Diagnosis not present

## 2022-08-03 DIAGNOSIS — W07XXXA Fall from chair, initial encounter: Secondary | ICD-10-CM | POA: Diagnosis present

## 2022-08-03 DIAGNOSIS — Z9889 Other specified postprocedural states: Secondary | ICD-10-CM | POA: Diagnosis not present

## 2022-08-03 DIAGNOSIS — M47816 Spondylosis without myelopathy or radiculopathy, lumbar region: Secondary | ICD-10-CM | POA: Diagnosis not present

## 2022-08-03 DIAGNOSIS — S41112A Laceration without foreign body of left upper arm, initial encounter: Secondary | ICD-10-CM | POA: Diagnosis not present

## 2022-08-03 DIAGNOSIS — S72144A Nondisplaced intertrochanteric fracture of right femur, initial encounter for closed fracture: Secondary | ICD-10-CM | POA: Diagnosis not present

## 2022-08-03 DIAGNOSIS — W01198A Fall on same level from slipping, tripping and stumbling with subsequent striking against other object, initial encounter: Secondary | ICD-10-CM | POA: Diagnosis not present

## 2022-08-03 DIAGNOSIS — S51812A Laceration without foreign body of left forearm, initial encounter: Secondary | ICD-10-CM | POA: Diagnosis not present

## 2022-08-03 DIAGNOSIS — Z7984 Long term (current) use of oral hypoglycemic drugs: Secondary | ICD-10-CM | POA: Diagnosis not present

## 2022-08-03 DIAGNOSIS — I352 Nonrheumatic aortic (valve) stenosis with insufficiency: Secondary | ICD-10-CM | POA: Diagnosis not present

## 2022-08-03 DIAGNOSIS — R296 Repeated falls: Secondary | ICD-10-CM | POA: Diagnosis not present

## 2022-08-03 DIAGNOSIS — S0181XA Laceration without foreign body of other part of head, initial encounter: Secondary | ICD-10-CM | POA: Diagnosis not present

## 2022-08-03 DIAGNOSIS — G319 Degenerative disease of nervous system, unspecified: Secondary | ICD-10-CM | POA: Diagnosis not present

## 2022-08-03 DIAGNOSIS — Z955 Presence of coronary angioplasty implant and graft: Secondary | ICD-10-CM | POA: Diagnosis not present

## 2022-08-03 DIAGNOSIS — I6523 Occlusion and stenosis of bilateral carotid arteries: Secondary | ICD-10-CM | POA: Diagnosis not present

## 2022-08-03 DIAGNOSIS — F0392 Unspecified dementia, unspecified severity, with psychotic disturbance: Secondary | ICD-10-CM | POA: Diagnosis not present

## 2022-08-03 DIAGNOSIS — Z681 Body mass index (BMI) 19 or less, adult: Secondary | ICD-10-CM | POA: Diagnosis not present

## 2022-08-03 DIAGNOSIS — Z86010 Personal history of colonic polyps: Secondary | ICD-10-CM | POA: Diagnosis not present

## 2022-08-03 DIAGNOSIS — I509 Heart failure, unspecified: Secondary | ICD-10-CM | POA: Diagnosis not present

## 2022-08-03 DIAGNOSIS — D62 Acute posthemorrhagic anemia: Secondary | ICD-10-CM | POA: Diagnosis not present

## 2022-08-03 DIAGNOSIS — M6281 Muscle weakness (generalized): Secondary | ICD-10-CM | POA: Diagnosis not present

## 2022-08-03 DIAGNOSIS — M4312 Spondylolisthesis, cervical region: Secondary | ICD-10-CM | POA: Diagnosis not present

## 2022-08-03 DIAGNOSIS — E119 Type 2 diabetes mellitus without complications: Secondary | ICD-10-CM | POA: Diagnosis not present

## 2022-08-03 DIAGNOSIS — M533 Sacrococcygeal disorders, not elsewhere classified: Secondary | ICD-10-CM | POA: Diagnosis not present

## 2022-08-03 DIAGNOSIS — I2510 Atherosclerotic heart disease of native coronary artery without angina pectoris: Secondary | ICD-10-CM | POA: Diagnosis not present

## 2022-08-03 DIAGNOSIS — S81019A Laceration without foreign body, unspecified knee, initial encounter: Secondary | ICD-10-CM | POA: Diagnosis not present

## 2022-08-03 DIAGNOSIS — R531 Weakness: Secondary | ICD-10-CM | POA: Diagnosis not present

## 2022-08-03 MED ORDER — CEFTRIAXONE SODIUM 1 G IJ SOLR
1.0000 g | Freq: Once | INTRAMUSCULAR | Status: AC
  Administered 2022-08-03: 1 g via INTRAMUSCULAR
  Filled 2022-08-03: qty 10

## 2022-08-03 NOTE — ED Notes (Signed)
Pt got up to use a urinal. Went back to bed after.

## 2022-08-03 NOTE — Progress Notes (Signed)
Patient's Berkley Harvey has been approved, approved 1/17 - 1/19, next review 1/19, navi auth id [id number].  CM spoke with [facility], notified of approval and received bed assignment.  Patient will go to room 322, bedside RN please call report to [phone].

## 2022-08-03 NOTE — ED Provider Notes (Signed)
Emergency Medicine Observation Re-evaluation Note  Jordan [name] is a 89 y.o. male, seen on rounds today.  Pt initially presented to the ED for complaints of Altered Mental Status Currently, the patient is calm.  Physical Exam  BP 123/66 (BP Location: Left Arm)   Pulse 66   Temp 98 F (36.7 C) (Oral)   Resp 18   Ht 5\' 9"  (1.753 m)   Wt 55.7 kg   SpO2 100%   BMI 18.13 kg/m  Physical Exam General: No distress Cardiac: Regular rate Lungs: No respiratory distress Psych: calm  ED Course / MDM  EKG:EKG Interpretation  Date/Time:  Friday July 21 2022 12:26:08 EST Ventricular Rate:  85 PR Interval:  219 QRS Duration: 130 QT Interval:  389 QTC Calculation: 463 R Axis:   265 Text Interpretation: Sinus rhythm Borderline prolonged PR interval RBBB and LAFB Confirmed by 09-17-1992 (Cathren Laine) on 07/21/2022 12:34:42 PM  Recent changes in the last 24 hours include patient is being discharged to SNF today.  Plan  Current plan is for [facility] to SNF. Discussed with patient who is stable for discharge. Instructed to follow-up with primary care physician within the next 1 to 2 weeks.  Discharged with discharge instructions and return precautions.09/19/2022, MD 08/03/22 [phone]

## 2022-08-03 NOTE — Progress Notes (Signed)
Insurance auth remains pending at this time.  

## 2022-08-03 NOTE — ED Notes (Signed)
[facility] called at this time to transport patient to [facility] in [place]. Nurse notified.

## 2022-08-03 NOTE — ED Notes (Signed)
This RN attempted to call report to [facility] for a third time, was unable to reach anyone at this time. Pt supposed to go to room 322 and number for report: [phone]

## 2022-08-03 NOTE — ED Notes (Signed)
Pt urinating blood and stating it hurts, EDP notified.

## 2022-08-03 NOTE — ED Notes (Signed)
This RN attempted to call report twice to accepting facility, [facility], was unable to reach anyone at this time. Charge RN also attempted to call report, that RN was also unable to reach anyone at this time

## 2022-08-03 NOTE — ED Notes (Signed)
Pt assisted in bed to use urinal. Pt able to use urinal, new depend applied and pt pulled up in bed and repositioned

## 2022-08-04 LAB — URINE CULTURE: Culture: >=100000 — AB

## 2022-08-05 ENCOUNTER — Telehealth (HOSPITAL_BASED_OUTPATIENT_CLINIC_OR_DEPARTMENT_OTHER): Payer: Self-pay | Admitting: *Deleted

## 2022-08-05 NOTE — Telephone Encounter (Signed)
Post ED Visit - Positive Culture Follow-up: Successful Patient Follow-Up  Culture assessed and recommendations reviewed by:  []  Elenor Quinones, Pharm.D. []  Heide Guile, Pharm.D., BCPS AQ-ID []  Parks Neptune, Pharm.D., BCPS []  Alycia Rossetti, Pharm.D., BCPS []  [place], Pharm.D., BCPS, AAHIVP []  Legrand Como, Pharm.D., BCPS, AAHIVP []  Salome Arnt, PharmD, BCPS []  Johnnette Gourd, PharmD, BCPS []  Hughes Better, PharmD, BCPS [x]  Lorelei Pont, PharmD  Positive urine culture  []  Patient discharged without antimicrobial prescription and treatment is now indicated []  Organism is resistant to prescribed ED discharge antimicrobial []  Patient with positive blood cultures  Changes discussed with ED provider: Suella Broad, PA-C New antibiotic prescription Cefdinir 300mg  Daily x 5days Faxed report to [place], [place]   Contacted facilityt, date 08/05/22, time 1237   Rosie Fate 08/05/2022, 12:45 PM

## 2022-08-05 NOTE — Progress Notes (Signed)
ED Antimicrobial Stewardship Positive Culture Follow Up   Jordan [name] is an 89 y.o. male who presented to [facility] on 07/21/2022 with a chief complaint of  Chief Complaint  Patient presents with   Altered Mental Status    Recent Results (from the past 720 hour(s))  Resp panel by RT-PCR (RSV, Flu A&B, Covid) Anterior Nasal Swab     Status: None   Collection Time: 07/21/22 12:14 PM   Specimen: Anterior Nasal Swab  Result Value Ref Range Status   SARS Coronavirus 2 by RT PCR NEGATIVE NEGATIVE Final    Comment: (NOTE) SARS-CoV-2 target nucleic acids are NOT DETECTED.  The SARS-CoV-2 RNA is generally detectable in upper respiratory specimens during the acute phase of infection. The lowest concentration of SARS-CoV-2 viral copies this assay can detect is 138 copies/mL. A negative result does not preclude SARS-Cov-2 infection and should not be used as the sole basis for treatment or other patient management decisions. A negative result may occur with  improper specimen collection/handling, submission of specimen other than nasopharyngeal swab, presence of viral mutation(s) within the areas targeted by this assay, and inadequate number of viral copies(<138 copies/mL). A negative result must be combined with clinical observations, patient history, and epidemiological information. The expected result is Negative.  Fact Sheet for Patients:  BloggerCourse.com  Fact Sheet for Healthcare Providers:  SeriousBroker.it  This test is no t yet approved or cleared by the [place] FDA and  has been authorized for detection and/or diagnosis of SARS-CoV-2 by FDA under an Emergency Use Authorization (EUA). This EUA will remain  in effect (meaning this test can be used) for the duration of the COVID-19 declaration under Section 564(b)(1) of the Act, 21 U.S.C.section 360bbb-3(b)(1), unless the authorization is terminated  or revoked  sooner.       Influenza A by PCR NEGATIVE NEGATIVE Final   Influenza B by PCR NEGATIVE NEGATIVE Final    Comment: (NOTE) The Xpert Xpress SARS-CoV-2/FLU/RSV plus assay is intended as an aid in the diagnosis of influenza from Nasopharyngeal swab specimens and should not be used as a sole basis for treatment. Nasal washings and aspirates are unacceptable for Xpert Xpress SARS-CoV-2/FLU/RSV testing.  Fact Sheet for Patients: BloggerCourse.com  Fact Sheet for Healthcare Providers: SeriousBroker.it  This test is not yet approved or cleared by the [place] FDA and has been authorized for detection and/or diagnosis of SARS-CoV-2 by FDA under an Emergency Use Authorization (EUA). This EUA will remain in effect (meaning this test can be used) for the duration of the COVID-19 declaration under Section 564(b)(1) of the Act, 21 U.S.C. section 360bbb-3(b)(1), unless the authorization is terminated or revoked.     Resp Syncytial Virus by PCR NEGATIVE NEGATIVE Final    Comment: (NOTE) Fact Sheet for Patients: BloggerCourse.com  Fact Sheet for Healthcare Providers: SeriousBroker.it  This test is not yet approved or cleared by the [place] FDA and has been authorized for detection and/or diagnosis of SARS-CoV-2 by FDA under an Emergency Use Authorization (EUA). This EUA will remain in effect (meaning this test can be used) for the duration of the COVID-19 declaration under Section 564(b)(1) of the Act, 21 U.S.C. section 360bbb-3(b)(1), unless the authorization is terminated or revoked.  Performed at [facility], [address] [place], [place] [address]   Urine Culture     Status: Abnormal   Collection Time: 08/02/22  5:54 AM   Specimen: Urine, Clean Catch  Result Value Ref Range Status   Specimen  Description   Final    URINE, CLEAN CATCH Performed at [facility],  [address], [place], [place] [address]    Special Requests   Final    NONE Performed at [facility], [address], [place], [place] [address]    Culture >=100,000 COLONIES/mL ENTEROBACTER AEROGENES (A)  Final   Report Status 08/04/2022 FINAL  Final   Organism ID, Bacteria ENTEROBACTER AEROGENES (A)  Final      Susceptibility   Enterobacter aerogenes - MIC*    CEFAZOLIN >=64 RESISTANT Resistant     CEFEPIME <=0.12 SENSITIVE Sensitive     CEFTRIAXONE <=0.25 SENSITIVE Sensitive     CIPROFLOXACIN <=0.25 SENSITIVE Sensitive     GENTAMICIN <=1 SENSITIVE Sensitive     IMIPENEM 1 SENSITIVE Sensitive     NITROFURANTOIN 64 INTERMEDIATE Intermediate     TRIMETH/SULFA <=20 SENSITIVE Sensitive     PIP/TAZO <=4 SENSITIVE Sensitive     * >=100,000 COLONIES/mL ENTEROBACTER AEROGENES    [x]  Treated with rocephin and an incomplete cephalexin course that was not continued on discharge.  New antibiotic prescription: cefdinir 300 mg daily x 5 days (Qty 5; Refills 0)  ED Provider: Suella Broad PA   Lorelei Pont, PharmD, BCPS 08/05/2022 11:16 AM ED Clinical Pharmacist -  [phone]

## 2022-08-07 ENCOUNTER — Other Ambulatory Visit: Payer: Self-pay | Admitting: Urology

## 2022-08-11 ENCOUNTER — Other Ambulatory Visit: Payer: Self-pay

## 2022-08-11 ENCOUNTER — Emergency Department: Payer: Medicare Other

## 2022-08-11 ENCOUNTER — Emergency Department
Admission: EM | Admit: 2022-08-11 | Discharge: 2022-08-11 | Disposition: A | Discharge status: Skilled Nursing Facility | Payer: Medicare Other | Attending: Emergency Medicine | Admitting: Emergency Medicine

## 2022-08-11 DIAGNOSIS — G319 Degenerative disease of nervous system, unspecified: Secondary | ICD-10-CM | POA: Diagnosis not present

## 2022-08-11 DIAGNOSIS — R531 Weakness: Secondary | ICD-10-CM | POA: Diagnosis not present

## 2022-08-11 DIAGNOSIS — M4312 Spondylolisthesis, cervical region: Secondary | ICD-10-CM | POA: Diagnosis not present

## 2022-08-11 DIAGNOSIS — S41112A Laceration without foreign body of left upper arm, initial encounter: Secondary | ICD-10-CM | POA: Diagnosis not present

## 2022-08-11 DIAGNOSIS — S0990XA Unspecified injury of head, initial encounter: Secondary | ICD-10-CM | POA: Diagnosis not present

## 2022-08-11 DIAGNOSIS — S0181XA Laceration without foreign body of other part of head, initial encounter: Secondary | ICD-10-CM | POA: Diagnosis not present

## 2022-08-11 DIAGNOSIS — S51811A Laceration without foreign body of right forearm, initial encounter: Secondary | ICD-10-CM | POA: Diagnosis not present

## 2022-08-11 DIAGNOSIS — S51812A Laceration without foreign body of left forearm, initial encounter: Secondary | ICD-10-CM | POA: Diagnosis not present

## 2022-08-11 DIAGNOSIS — E119 Type 2 diabetes mellitus without complications: Secondary | ICD-10-CM | POA: Insufficient documentation

## 2022-08-11 DIAGNOSIS — F0390 Unspecified dementia without behavioral disturbance: Secondary | ICD-10-CM | POA: Insufficient documentation

## 2022-08-11 DIAGNOSIS — R296 Repeated falls: Secondary | ICD-10-CM | POA: Diagnosis not present

## 2022-08-11 DIAGNOSIS — I509 Heart failure, unspecified: Secondary | ICD-10-CM | POA: Diagnosis not present

## 2022-08-11 DIAGNOSIS — W01198A Fall on same level from slipping, tripping and stumbling with subsequent striking against other object, initial encounter: Secondary | ICD-10-CM | POA: Diagnosis not present

## 2022-08-11 DIAGNOSIS — I110 Hypertensive heart disease with heart failure: Secondary | ICD-10-CM | POA: Insufficient documentation

## 2022-08-11 DIAGNOSIS — I6523 Occlusion and stenosis of bilateral carotid arteries: Secondary | ICD-10-CM | POA: Diagnosis not present

## 2022-08-11 DIAGNOSIS — Z9889 Other specified postprocedural states: Secondary | ICD-10-CM | POA: Diagnosis not present

## 2022-08-11 NOTE — ED Triage Notes (Signed)
Pt comes via EMS from [facility] with c/o trip and fall. Pt did hit head but no loc. Pt is not on thinnrs. Pt had bandage in place on right arm and head and all bleeding controlled.

## 2022-08-11 NOTE — ED Notes (Signed)
DNR paperwork at bedside.

## 2022-08-11 NOTE — ED Provider Notes (Signed)
[facility] Provider Note    Event Date/Time   First MD Initiated Contact with Patient 08/11/22 1617     (approximate)   History   Fall   HPI  Jordan [name] is a 89 y.o. male with history of CHF, type 2 diabetes, hypertension, dementia and as listed in EMR presents to the emergency department for treatment and evaluation after witnessed fall.  He is a resident at [facility].  Per EMS report, patient tripped and fell forward.  He now has a small laceration to his forehead and skin tears to his upper extremities.  Patient did not lose consciousness and is at his cognitive baseline.      Physical Exam   Triage Vital Signs: ED Triage Vitals  Enc Vitals Group     BP 08/11/22 1618 (!) 140/85     Pulse Rate 08/11/22 1618 79     Resp 08/11/22 1618 17     Temp 08/11/22 1618 98 F (36.7 C)     Temp Source 08/11/22 1618 Oral     SpO2 08/11/22 1618 99 %     Weight --      Height --      Head Circumference --      Peak Flow --      Pain Score 08/11/22 1547 3     Pain Loc --      Pain Edu? --      Excl. in [place]? --     Most recent vital signs: Vitals:   08/11/22 1618 08/11/22 1826  BP: (!) 140/85 (!) 157/69  Pulse: 79 81  Resp: 17   Temp: 98 F (36.7 C)   SpO2: 99% 100%    General: Awake, no distress.  CV:  Good peripheral perfusion.  Resp:  Normal effort.  Abd:  No distention.  Other:  2 cm laceration left forehead, skin tear left shoulder, skin tear right forearm   ED Results / Procedures / Treatments   Labs (all labs ordered are listed, but only abnormal results are displayed) Labs Reviewed - No data to display   EKG  Not indicated   RADIOLOGY  CT head and cervical spine negative for acute findings  PROCEDURES:  Critical Care performed: No  ..Laceration Repair  Date/Time: 08/11/2022 7:02 PM  Performed by: Victorino Dike, FNP Authorized by: Victorino Dike, FNP   Consent:    Consent obtained:  Verbal    Consent given by:  Parent   Risks discussed:  Pain, poor cosmetic result and poor wound healing Universal protocol:    Patient identity confirmed:  Verbally with patient Anesthesia:    Anesthesia method:  None Laceration details:    Location:  Face   Face location:  Forehead   Length (cm):  2 Treatment:    Area cleansed with:  Chlorhexidine and saline Skin repair:    Repair method:  Tissue adhesive Approximation:    Approximation:  Close Repair type:    Repair type:  Simple Post-procedure details:    Procedure completion:  Tolerated well, no immediate complications Comments:     Skin tears to left shoulder and left forearm cleaned with saline and chlorhexidine then covered with Xeroform and 4 x 4's.    MEDICATIONS ORDERED IN ED:  Medications - No data to display   IMPRESSION / MDM / [place] / ED COURSE   I have reviewed the triage note.  Differential diagnosis includes, but is not limited to,  ICH, concussion, skin tear, fracture  Patient's presentation is most consistent with acute presentation with potential threat to life or bodily function.  89 year old male presents to the emergency department for treatment and evaluation after tripping and falling. Laceration to the forehead not actively bleeding.  Patient is awake, alert, and following commands.  He is very hard of hearing but has answered all questions appropriately once he is able to hear questions.  Wounds cleaned and repaired as above. CT head and cervical spine are normal.  Daughter at bedside.  Results discussed and plan to discharge him back to [facility]. She will take him back via POV.     FINAL CLINICAL IMPRESSION(S) / ED DIAGNOSES   Final diagnoses:  Skin tear of left upper arm without complication, initial encounter  Skin tear of right forearm without complication, initial encounter  Minor head injury, initial encounter  Laceration of forehead, initial encounter     Rx / DC  Orders   ED Discharge Orders     None        Note:  This document was prepared using Dragon voice recognition software and may include unintentional dictation errors.   Victorino Dike, FNP 08/11/22 Jordan [name]    Lavonia Drafts, MD 08/11/22 [phone]

## 2022-08-15 ENCOUNTER — Emergency Department: Payer: Medicare Other

## 2022-08-15 ENCOUNTER — Encounter: Payer: Self-pay | Admitting: Emergency Medicine

## 2022-08-15 ENCOUNTER — Inpatient Hospital Stay
Admission: EM | Admit: 2022-08-15 | Discharge: 2022-08-22 | DRG: 481 | Disposition: A | Discharge status: Skilled Nursing Facility | Payer: Medicare Other | Source: Skilled Nursing Facility | Attending: Internal Medicine | Admitting: Internal Medicine

## 2022-08-15 DIAGNOSIS — Z681 Body mass index (BMI) 19 or less, adult: Secondary | ICD-10-CM | POA: Diagnosis not present

## 2022-08-15 DIAGNOSIS — E11649 Type 2 diabetes mellitus with hypoglycemia without coma: Secondary | ICD-10-CM | POA: Diagnosis not present

## 2022-08-15 DIAGNOSIS — Z66 Do not resuscitate: Secondary | ICD-10-CM | POA: Diagnosis not present

## 2022-08-15 DIAGNOSIS — I352 Nonrheumatic aortic (valve) stenosis with insufficiency: Secondary | ICD-10-CM | POA: Diagnosis present

## 2022-08-15 DIAGNOSIS — Z86010 Personal history of colonic polyps: Secondary | ICD-10-CM | POA: Diagnosis not present

## 2022-08-15 DIAGNOSIS — M533 Sacrococcygeal disorders, not elsewhere classified: Secondary | ICD-10-CM | POA: Diagnosis not present

## 2022-08-15 DIAGNOSIS — I5032 Chronic diastolic (congestive) heart failure: Secondary | ICD-10-CM | POA: Diagnosis present

## 2022-08-15 DIAGNOSIS — Z043 Encounter for examination and observation following other accident: Secondary | ICD-10-CM | POA: Diagnosis not present

## 2022-08-15 DIAGNOSIS — Y92128 Other place in nursing home as the place of occurrence of the external cause: Secondary | ICD-10-CM | POA: Diagnosis not present

## 2022-08-15 DIAGNOSIS — Z955 Presence of coronary angioplasty implant and graft: Secondary | ICD-10-CM | POA: Diagnosis not present

## 2022-08-15 DIAGNOSIS — Z87891 Personal history of nicotine dependence: Secondary | ICD-10-CM | POA: Diagnosis not present

## 2022-08-15 DIAGNOSIS — F03918 Unspecified dementia, unspecified severity, with other behavioral disturbance: Secondary | ICD-10-CM | POA: Diagnosis present

## 2022-08-15 DIAGNOSIS — E440 Moderate protein-calorie malnutrition: Secondary | ICD-10-CM | POA: Diagnosis not present

## 2022-08-15 DIAGNOSIS — K5732 Diverticulitis of large intestine without perforation or abscess without bleeding: Secondary | ICD-10-CM | POA: Diagnosis not present

## 2022-08-15 DIAGNOSIS — I959 Hypotension, unspecified: Secondary | ICD-10-CM | POA: Diagnosis not present

## 2022-08-15 DIAGNOSIS — D62 Acute posthemorrhagic anemia: Secondary | ICD-10-CM | POA: Diagnosis not present

## 2022-08-15 DIAGNOSIS — S72144A Nondisplaced intertrochanteric fracture of right femur, initial encounter for closed fracture: Secondary | ICD-10-CM

## 2022-08-15 DIAGNOSIS — E118 Type 2 diabetes mellitus with unspecified complications: Secondary | ICD-10-CM

## 2022-08-15 DIAGNOSIS — I2510 Atherosclerotic heart disease of native coronary artery without angina pectoris: Secondary | ICD-10-CM | POA: Diagnosis not present

## 2022-08-15 DIAGNOSIS — S7290XA Unspecified fracture of unspecified femur, initial encounter for closed fracture: Secondary | ICD-10-CM | POA: Diagnosis present

## 2022-08-15 DIAGNOSIS — I10 Essential (primary) hypertension: Secondary | ICD-10-CM | POA: Diagnosis present

## 2022-08-15 DIAGNOSIS — Z4789 Encounter for other orthopedic aftercare: Secondary | ICD-10-CM | POA: Diagnosis not present

## 2022-08-15 DIAGNOSIS — F0392 Unspecified dementia, unspecified severity, with psychotic disturbance: Secondary | ICD-10-CM | POA: Diagnosis not present

## 2022-08-15 DIAGNOSIS — N400 Enlarged prostate without lower urinary tract symptoms: Secondary | ICD-10-CM | POA: Diagnosis not present

## 2022-08-15 DIAGNOSIS — Z961 Presence of intraocular lens: Secondary | ICD-10-CM | POA: Diagnosis present

## 2022-08-15 DIAGNOSIS — Z79899 Other long term (current) drug therapy: Secondary | ICD-10-CM | POA: Diagnosis not present

## 2022-08-15 DIAGNOSIS — Z7982 Long term (current) use of aspirin: Secondary | ICD-10-CM

## 2022-08-15 DIAGNOSIS — S72141D Displaced intertrochanteric fracture of right femur, subsequent encounter for closed fracture with routine healing: Secondary | ICD-10-CM | POA: Diagnosis not present

## 2022-08-15 DIAGNOSIS — W07XXXA Fall from chair, initial encounter: Secondary | ICD-10-CM | POA: Diagnosis present

## 2022-08-15 DIAGNOSIS — I509 Heart failure, unspecified: Secondary | ICD-10-CM | POA: Diagnosis not present

## 2022-08-15 DIAGNOSIS — S72141A Displaced intertrochanteric fracture of right femur, initial encounter for closed fracture: Secondary | ICD-10-CM | POA: Diagnosis not present

## 2022-08-15 DIAGNOSIS — Z8249 Family history of ischemic heart disease and other diseases of the circulatory system: Secondary | ICD-10-CM | POA: Diagnosis not present

## 2022-08-15 DIAGNOSIS — E785 Hyperlipidemia, unspecified: Secondary | ICD-10-CM | POA: Diagnosis present

## 2022-08-15 DIAGNOSIS — K52832 Lymphocytic colitis: Secondary | ICD-10-CM | POA: Diagnosis not present

## 2022-08-15 DIAGNOSIS — Z9181 History of falling: Secondary | ICD-10-CM

## 2022-08-15 DIAGNOSIS — I110 Hypertensive heart disease with heart failure: Secondary | ICD-10-CM | POA: Diagnosis present

## 2022-08-15 DIAGNOSIS — E1165 Type 2 diabetes mellitus with hyperglycemia: Secondary | ICD-10-CM | POA: Diagnosis not present

## 2022-08-15 DIAGNOSIS — D500 Iron deficiency anemia secondary to blood loss (chronic): Secondary | ICD-10-CM | POA: Diagnosis not present

## 2022-08-15 DIAGNOSIS — R41841 Cognitive communication deficit: Secondary | ICD-10-CM | POA: Diagnosis not present

## 2022-08-15 DIAGNOSIS — M47816 Spondylosis without myelopathy or radiculopathy, lumbar region: Secondary | ICD-10-CM | POA: Diagnosis not present

## 2022-08-15 DIAGNOSIS — S72001A Fracture of unspecified part of neck of right femur, initial encounter for closed fracture: Secondary | ICD-10-CM | POA: Diagnosis not present

## 2022-08-15 DIAGNOSIS — Z7401 Bed confinement status: Secondary | ICD-10-CM | POA: Diagnosis not present

## 2022-08-15 DIAGNOSIS — Z7984 Long term (current) use of oral hypoglycemic drugs: Secondary | ICD-10-CM | POA: Diagnosis not present

## 2022-08-15 DIAGNOSIS — Z96641 Presence of right artificial hip joint: Secondary | ICD-10-CM | POA: Diagnosis not present

## 2022-08-15 DIAGNOSIS — W19XXXA Unspecified fall, initial encounter: Secondary | ICD-10-CM | POA: Diagnosis not present

## 2022-08-15 DIAGNOSIS — M6281 Muscle weakness (generalized): Secondary | ICD-10-CM | POA: Diagnosis not present

## 2022-08-15 DIAGNOSIS — S7291XD Unspecified fracture of right femur, subsequent encounter for closed fracture with routine healing: Secondary | ICD-10-CM | POA: Diagnosis not present

## 2022-08-15 LAB — CBC
HCT: 33 % — ABNORMAL LOW (ref 39.0–52.0)
Hemoglobin: 10.2 g/dL — ABNORMAL LOW (ref 13.0–17.0)
MCH: 28.7 pg (ref 26.0–34.0)
MCHC: 30.9 g/dL (ref 30.0–36.0)
MCV: 92.7 fL (ref 80.0–100.0)
Platelets: 297 10*3/uL (ref 150–400)
RBC: 3.56 MIL/uL — ABNORMAL LOW (ref 4.22–5.81)
RDW: 13.9 % (ref 11.5–15.5)
WBC: 6 10*3/uL (ref 4.0–10.5)
nRBC: 0 % (ref 0.0–0.2)

## 2022-08-15 LAB — BASIC METABOLIC PANEL
Anion gap: 10 (ref 5–15)
BUN: 28 mg/dL — ABNORMAL HIGH (ref 8–23)
CO2: 25 mmol/L (ref 22–32)
Calcium: 8.2 mg/dL — ABNORMAL LOW (ref 8.9–10.3)
Chloride: 103 mmol/L (ref 98–111)
Creatinine, Ser: 1.06 mg/dL (ref 0.61–1.24)
GFR, Estimated: >60 mL/min (ref >60)
Glucose, Bld: 211 mg/dL — ABNORMAL HIGH (ref 70–99)
Potassium: 3.8 mmol/L (ref 3.5–5.1)
Sodium: 138 mmol/L (ref 135–145)

## 2022-08-15 LAB — TYPE AND SCREEN
ABO/RH(D): O POS
Antibody Screen: NEGATIVE

## 2022-08-15 LAB — TROPONIN I (HIGH SENSITIVITY): Troponin I (High Sensitivity): 6 ng/L (ref <18)

## 2022-08-15 MED ORDER — HEPARIN SODIUM (PORCINE) 5000 UNIT/ML IJ SOLN
5000.0000 [IU] | Freq: Three times a day (TID) | INTRAMUSCULAR | Status: AC
  Administered 2022-08-15: 5000 [IU] via SUBCUTANEOUS
  Filled 2022-08-15: qty 1

## 2022-08-15 MED ORDER — METOPROLOL TARTRATE 25 MG PO TABS
12.5000 mg | ORAL_TABLET | Freq: Two times a day (BID) | ORAL | Status: DC
  Administered 2022-08-17 – 2022-08-22 (×10): 12.5 mg via ORAL
  Filled 2022-08-15 (×12): qty 1

## 2022-08-15 MED ORDER — HYDROCODONE-ACETAMINOPHEN 5-325 MG PO TABS
1.0000 | ORAL_TABLET | Freq: Four times a day (QID) | ORAL | Status: DC | PRN
  Administered 2022-08-17 – 2022-08-20 (×4): 1 via ORAL
  Filled 2022-08-15 (×6): qty 1

## 2022-08-15 MED ORDER — HYDROMORPHONE HCL 1 MG/ML IJ SOLN
0.5000 mg | INTRAMUSCULAR | Status: DC | PRN
  Administered 2022-08-18: 0.5 mg via INTRAVENOUS
  Filled 2022-08-15: qty 1

## 2022-08-15 MED ORDER — MIRABEGRON ER 25 MG PO TB24
25.0000 mg | ORAL_TABLET | Freq: Every morning | ORAL | Status: DC
  Administered 2022-08-17 – 2022-08-22 (×6): 25 mg via ORAL
  Filled 2022-08-15 (×7): qty 1

## 2022-08-15 MED ORDER — ACETAMINOPHEN 325 MG PO TABS
650.0000 mg | ORAL_TABLET | Freq: Four times a day (QID) | ORAL | Status: DC | PRN
  Administered 2022-08-22: 650 mg via ORAL
  Filled 2022-08-15 (×2): qty 2

## 2022-08-15 MED ORDER — FINASTERIDE 5 MG PO TABS
5.0000 mg | ORAL_TABLET | Freq: Every day | ORAL | Status: DC
  Administered 2022-08-17 – 2022-08-22 (×6): 5 mg via ORAL
  Filled 2022-08-15 (×7): qty 1

## 2022-08-15 MED ORDER — POLYETHYLENE GLYCOL 3350 17 G PO PACK: 17.0000 g | PACK | Freq: Every day | ORAL | Status: DC | PRN

## 2022-08-15 MED ORDER — PRAVASTATIN SODIUM 20 MG PO TABS
80.0000 mg | ORAL_TABLET | Freq: Every day | ORAL | Status: DC
  Administered 2022-08-17 – 2022-08-21 (×5): 80 mg via ORAL
  Filled 2022-08-15 (×5): qty 4

## 2022-08-15 MED ORDER — MEMANTINE HCL 5 MG PO TABS
10.0000 mg | ORAL_TABLET | Freq: Two times a day (BID) | ORAL | Status: DC
  Administered 2022-08-17 – 2022-08-22 (×10): 10 mg via ORAL
  Filled 2022-08-15 (×12): qty 2

## 2022-08-15 MED ORDER — BUDESONIDE 3 MG PO CPEP
3.0000 mg | ORAL_CAPSULE | Freq: Every morning | ORAL | Status: DC
  Administered 2022-08-17 – 2022-08-22 (×6): 3 mg via ORAL
  Filled 2022-08-15 (×7): qty 1

## 2022-08-15 MED ORDER — QUETIAPINE FUMARATE 25 MG PO TABS
75.0000 mg | ORAL_TABLET | Freq: Every day | ORAL | Status: DC
  Administered 2022-08-15 – 2022-08-20 (×5): 75 mg via ORAL
  Filled 2022-08-15 (×6): qty 3

## 2022-08-15 MED ORDER — INSULIN ASPART 100 UNIT/ML IJ SOLN
0.0000 [IU] | Freq: Three times a day (TID) | INTRAMUSCULAR | Status: DC
  Administered 2022-08-16: 1 [IU] via SUBCUTANEOUS
  Administered 2022-08-17 (×3): 2 [IU] via SUBCUTANEOUS
  Filled 2022-08-15 (×4): qty 1

## 2022-08-15 NOTE — Assessment & Plan Note (Addendum)
In the setting of a mechanical fall with multiple falls in the past.  Discussed with patient's daughter/HCPOA regarding options moving forward.  She understands that patient is high risk for surgery, but she finds it difficult to accept that he would be bedbound without surgery.  I wonder if patient may be a good candidate for spinal block; will discuss with orthopedic surgery.  Revised cardiac risk index elevated at class III risk.  - Orthopedic surgery consulted; appreciate their recommendations - Pain control with Tylenol, Norco and Dilaudid - Bedrest for now - N.p.o. at midnight in the case surgery scheduled

## 2022-08-15 NOTE — ED Triage Notes (Signed)
Pt arrived via [facility] from [facility] post witnessed fall. Per staff at facility pt was attempting to stand from dinning table and legs buckled with pt sliding to floor. Pt with hx/o dementia and frequent falls. Last fall 1/26 with healing bruising noted to the left forehead and bridge of nose. Per staff, pt did not hit head or have LOC tonight. Pt favoring his right leg and hip pain. Pt unwilling to straighten right leg and request pillow to place underneath. On assessment pt with dark brown dried substance on chin with darkened black specs noted inside of mouth and tongue. No noted blood thinners in paperwork from facility.   Pt poor historian and unwilling to answer questions asked.   **DNR with pt**

## 2022-08-15 NOTE — Assessment & Plan Note (Signed)
-  SSI, sensitive - Hold home metformin

## 2022-08-15 NOTE — Assessment & Plan Note (Signed)
-  Continue home memantine and Seroquel - Delirium precautions

## 2022-08-15 NOTE — ED Provider Notes (Signed)
[facility] Provider Note    Event Date/Time   First MD Initiated Contact with Patient 08/15/22 2048     (approximate)   History   Fall   HPI  Jordan [name] is a 89 y.o. male with a history of CHF, type 2 diabetes, hypertension, and dementia who presents with a fall.  The patient was witnessed falling and did not hit his head or have LOC.  He was noted to have right hip pain and difficulty moving the right leg afterwards.  The patient reports pain on moving the right leg.  He denies any other acute pain at this time.  He does not remember if he hit his head.  I reviewed the past medical records.  The patient was most recently seen here for a fall on 1/26 and had a small forehead laceration at that time.  Workup was negative.  He has several prior ED visits over the last month.  He was also admitted at the end of December for hypokalemia and dementia with behavioral disturbance.   Physical Exam   Triage Vital Signs: ED Triage Vitals  Enc Vitals Group     BP 08/15/22 2014 (!) 145/73     Pulse Rate 08/15/22 2014 63     Resp 08/15/22 2014 16     Temp 08/15/22 2014 98.3 F (36.8 C)     Temp Source 08/15/22 2014 Oral     SpO2 08/15/22 2014 95 %     Weight 08/15/22 2007 121 lb 4.1 oz (55 kg)     Height 08/15/22 2007 5\' 9"  (1.753 m)     Head Circumference --      Peak Flow --      Pain Score --      Pain Loc --      Pain Edu? --      Excl. in [place]? --     Most recent vital signs: Vitals:   08/15/22 2014 08/15/22 2100  BP: (!) 145/73 (!) 159/62  Pulse: 63 68  Resp: 16 16  Temp: 98.3 F (36.8 C)   SpO2: 95% 100%    General: Alert, oriented x 1. CV:  Good peripheral perfusion.  Resp:  Normal effort.  Abd:  No distention.  Other:  Pain on range of motion of right hip.  Right lower extremity shortened and internally rotated.  2+ DP pulse.  Normal cap refill.  Healing abrasion to left forehead.  EOMI.  PERRLA.  Motor intact in all  extremities.   ED Results / Procedures / Treatments   Labs (all labs ordered are listed, but only abnormal results are displayed) Labs Reviewed  BASIC METABOLIC PANEL - Abnormal; Notable for the following components:      Result Value   Glucose, Bld 211 (*)    BUN 28 (*)    Calcium 8.2 (*)    All other components within normal limits  CBC - Abnormal; Notable for the following components:   RBC 3.56 (*)    Hemoglobin 10.2 (*)    HCT 33.0 (*)    All other components within normal limits  URINALYSIS, ROUTINE W REFLEX MICROSCOPIC  TYPE AND SCREEN  TROPONIN I (HIGH SENSITIVITY)     EKG     RADIOLOGY  XR R hip: I independently viewed and interpreted the images; there is an intertrochanteric fracture  Chest x-ray: No focal consolidation or edema  PROCEDURES:  Critical Care performed: No  Procedures   MEDICATIONS ORDERED IN  ED: Medications - No data to display   IMPRESSION / MDM / [place] / ED COURSE  I reviewed the triage vital signs and the nursing notes.  89 year old male with PMH as noted above presents with right hip pain after a witnessed fall from standing height in which he currently did not hit his head or have LOC.  Neurologic exam is nonfocal.  The patient's vital signs are normal except for mild hypertension.  He has pain admit motion on range of the right hip.  Also on exam, there is some dried brownish liquid noted on the patient's chin.  There is no abrasion in this area.  The triage RN also noted some possible dark material in the patient's mouth although I did not see this during my exam.  The patient has not had any vomiting and there is no evidence of GI bleed.  Differential diagnosis includes, but is not limited to, hip fracture, contusion.  X-ray in fact confirms intertrochanteric right hip fracture.  We will obtain orthopedic consult, labs, and plan for likely admission.  Patient's presentation is most consistent with acute  presentation with potential threat to life or bodily function.  The patient is on the cardiac monitor to evaluate for evidence of arrhythmia and/or significant heart rate changes.  ----------------------------------------- 9:51 PM on 08/15/2022 -----------------------------------------  Lab workup reveals mild anemia with hemoglobin of 10, and normal electrolytes.  Troponin is negative.  There is no indication for a repeat.  ----------------------------------------- 10:22 PM on 08/15/2022 -----------------------------------------  I consulted Dr. Harlow Mares from orthopedics who will evaluate the patient.  I then consulted Dr. Charleen Kirks from the hospitalist service; based on our discussion she agrees to admit the patient.   FINAL CLINICAL IMPRESSION(S) / ED DIAGNOSES   Final diagnoses:  Closed displaced intertrochanteric fracture of right femur, initial encounter ([place])     Rx / DC Orders   ED Discharge Orders     None        Note:  This document was prepared using Dragon voice recognition software and may include unintentional dictation errors.    Arta Silence, MD 08/15/22 2223

## 2022-08-15 NOTE — Assessment & Plan Note (Signed)
Patient appears euvolemic.  No indication for IV Lasix at this time.

## 2022-08-15 NOTE — Assessment & Plan Note (Signed)
-  Continue home antihypertensives 

## 2022-08-15 NOTE — H&P (Signed)
History and Physical    Patient: Jordan [name] WHQ:[id number] DOB: 1923/03/06 DOA: 08/15/2022 DOS: the patient was seen and examined on 08/15/2022 PCP: Asencion Noble, MD  Patient coming from: ALF/ILF  Chief Complaint:  Chief Complaint  Patient presents with   Fall   HPI: Jordan [name] is a 89 y.o. male with medical history significant of dementia, CAD s/p DES to RCA (2009), HFpEF, hypertension, hyperlipidemia, type 2 diabetes, severe aortic stenosis, lymphocytic colitis, BPH who presents to the ED after a fall.  History obtained through chart review due to patient's underlying dementia.  Per chart review, patient arrived from [facility] after staff witnessed him standing from the dining table.  At the time, his legs appear to have buckled and patient slid down onto the floor.  He did not hit his head or have any loss of consciousness.  Since then, patient was unable to straighten his right leg.  Per chart review, patient has a history of frequent falls and was most recently evaluated on 08/11/2022 after patient tripped and fell forward hitting his forehead.  He required laceration repair at the time.  ED course: On arrival to the ED, patient was hypertensive at 159/62 with heart rate of 68.  He was saturating at 100% on room air.  Initial workup remarkable for WBC of 6.0, hemoglobin of 10.2, glucose of 211, BUN of 28, creatinine 1.06.  Chest x-ray was obtained with low lung volumes with no other abnormalities.  Hip x-ray was obtained that demonstrated a mildly displaced intertrochanteric right hip fracture.  Orthopedic surgery was consulted and EDP discussed with Dr. Harlow Mares.  TRH consulted for admission.  Review of Systems: As mentioned in the history of present illness. All other systems reviewed and are negative.  Past Medical History:  Diagnosis Date   Aortic stenosis    Arthritis    BPH (benign prostatic hypertrophy)    CAD (coronary artery disease)    DES to RCA  2009   Diverticulosis of colon    Essential hypertension    Fecal urgency    History of adenomatous polyp of colon    Tubular adenomas 2016   Hyperlipidemia    Lower urinary tract symptoms (LUTS)    Type 2 diabetes mellitus ([place])    Wears glasses    Past Surgical History:  Procedure Laterality Date   BIOPSY  10/02/2019   Procedure: BIOPSY;  Surgeon: Rogene Houston, MD;  [facility];  Service: Endoscopy;;   CATARACT EXTRACTION W/ INTRAOCULAR LENS  IMPLANT, BILATERAL     COLONOSCOPY  last one 11/16/2014   polpectomy   CORONARY ANGIOPLASTY WITH STENT PLACEMENT  12-23-2007  dr hochrein   (abnormal myoview)  PCI w/ DES x1 pRCA (95%),  pCFX 25%,  mCFX 40-50%,  after PDA 30%,  prox, mid, and ostial LAD  30% calcification,  LM diffuse 25% calcification,  ef 60%   EVALUATION UNDER ANESTHESIA WITH ANAL FISTULECTOMY [name] 06/03/2015   Procedure: ANAL EXAM UNDER ANESTHESIA ,FISTULOTOMY ;  Surgeon: [name], MD;  Location: [place];  Service: General;  Laterality: [name];   FLEXIBLE SIGMOIDOSCOPY [name] 10/02/2019   Procedure: FLEXIBLE SIGMOIDOSCOPY;  Surgeon: Rogene Houston, MD;  [facility];  Service: Endoscopy;  Laterality: [name];  230   INGUINAL HERNIA REPAIR Right 2001   TRANSTHORACIC ECHOCARDIOGRAM  12-06-2007   normal LVF, ef 60%/  mild to moderate  AV calcification without stenosis,  mild AR and TR,  mild  LAE   Social History:  reports that he quit smoking about 39 years ago. His smoking use included cigarettes. He has never used smokeless tobacco. He reports that he does not drink alcohol and does not use drugs.  No Known Allergies  Family History  Problem Relation Age of Onset   Heart disease Brother    Colon cancer Neg Hx     Prior to Admission medications   Medication Sig Start Date End Date Taking? Authorizing Provider  aspirin 81 MG chewable tablet Chew 81 mg by mouth in the morning. (0800)    [provider]  budesonide  (ENTOCORT EC) 3 MG 24 hr capsule TAKE 1 CAPSULE BY MOUTH DAILY. Patient taking differently: Take 3 mg by mouth in the morning. (0800) 06/12/22   [name], MD  feeding supplement (ENSURE ENLIVE / ENSURE PLUS) LIQD Take 237 mLs by mouth 3 (three) times daily after meals. 07/06/22   [name], Mir M, MD  finasteride (PROSCAR) 5 MG tablet TAKE 1 TABLET BY MOUTH DAILY AS DIRECTED. 08/08/22   [name], [name], MD  memantine (NAMENDA) 10 MG tablet Take 10 mg by mouth 2 (two) times daily. (0800, 2000)    [provider]  metFORMIN (GLUCOPHAGE) 500 MG tablet Take 500 mg by mouth at bedtime. (2100)    [provider]  metoprolol tartrate (LOPRESSOR) 25 MG tablet Take 0.5 tablets (12.5 mg total) by mouth 2 (two) times daily. Patient taking differently: Take 12.5 mg by mouth 2 (two) times daily. (0800, 2000) 07/06/22 08/05/22  [name], Mir M, MD  mirabegron ER (MYRBETRIQ) 25 MG TB24 tablet Take 1 tablet (25 mg total) by mouth daily. Patient taking differently: Take 25 mg by mouth in the morning. (0900) 03/17/22   [name], [name], MD  Multiple Vitamins-Minerals (PRESERVISION AREDS) TABS Take 1 tablet by mouth in the morning. (0800)    [provider]  pravastatin (PRAVACHOL) 80 MG tablet Take 80 mg by mouth daily at 6 PM.    [provider]  QUEtiapine (SEROQUEL) 25 MG tablet Take 3 tablets (75 mg total) by mouth at bedtime. Patient taking differently: Take 75 mg by mouth at bedtime. (2100) 07/06/22 08/05/22  [name], Mir M, MD  senna (SENOKOT) 8.6 MG TABS tablet Take 1 tablet (8.6 mg total) by mouth 2 (two) times daily. Patient not taking: Reported on 07/21/2022 07/18/22 08/17/22  [name], MD    Physical Exam: Vitals:   08/15/22 2007 08/15/22 2014 08/15/22 2100 08/15/22 2300  BP:  (!) 145/73 (!) 159/62 (!) 138/53  Pulse:  63 68 77  Resp:  16 16 19   Temp:  98.3 F (36.8 C)    TempSrc:  Oral    SpO2:  95% 100% 100%  Weight: 55 kg      Height: 5\' 9"  (1.753 m)      Physical Exam Vitals and nursing note reviewed.  Constitutional:      General: He is not in acute distress.    Appearance: He is normal weight. He is not toxic-appearing.     Comments: Chronically ill-appearing  HENT:     Mouth/Throat:     Mouth: Mucous membranes are moist.     Pharynx: Oropharynx is clear.     Comments: No blood present Eyes:     Conjunctiva/sclera: Conjunctivae normal.     Pupils: Pupils are equal, round, and reactive to light.  Cardiovascular:     Rate and Rhythm: Normal rate and regular rhythm.  Heart sounds: Murmur (3/6 systolic murmur with inability to auscultate S2) heard.  Pulmonary:     Effort: Pulmonary effort is normal. No respiratory distress.     Breath sounds: Normal breath sounds. No wheezing, rhonchi or rales.  Abdominal:     General: Bowel sounds are normal.     Palpations: Abdomen is soft.  Musculoskeletal:        General: Signs of injury (Right lower extremity externally rotated and shortened compared to left) present.     Right lower leg: No edema.     Left lower leg: No edema.  Skin:    General: Skin is warm and dry.     Coloration: Skin is pale.  Neurological:     Mental Status: He is alert. Mental status is at baseline. He is confused.     Comments:  Patient is alert and oriented only to self.  Occasional nonsensical replies to questions but able to explain that he is having hip pain. No focal weakness with the exception of the right lower extremity  Psychiatric:        Cognition and Memory: Cognition is impaired. Memory is impaired.    Data Reviewed: CBC with WBC of 6.0, hemoglobin of 10.2, platelets of 297 BMP with sodium of 138, potassium 3.8, bicarb 25, glucose 211, BUN 28, creatinine 1.06, calcium 8.2 and GFR above 60 Troponin within normal limits at 6  EKG personally reviewed.  Sinus rhythm with a rate of 66.  Right bundle branch block noted.  DG Chest 1 View  Result Date:  08/15/2022 CLINICAL DATA:  Fall. EXAM: CHEST  1 VIEW COMPARISON:  07/08/2022 FINDINGS: Low lung volumes.The cardiomediastinal contours are normal. The lungs are clear. Pulmonary vasculature is normal. No consolidation, pleural effusion, or pneumothorax. No acute osseous abnormalities are seen. IMPRESSION: Low lung volumes without acute abnormality. Electronically Signed   By: Keith Rake M.D.   On: 08/15/2022 20:46   DG Hip Unilat W or Wo Pelvis 2-3 Views Right  Result Date: 08/15/2022 CLINICAL DATA:  Pain after fall EXAM: DG HIP (WITH OR WITHOUT PELVIS) 2 V RIGHT COMPARISON:  None Available. FINDINGS: Mildly displaced intertrochanteric hip fracture on the right. Comminuted. No additional fracture or dislocation. Preserved joint spaces. Only mild degenerative changes of the sacroiliac joints. Hyperostosis. Osteopenia. Scattered vascular calcifications. Degenerative changes seen of the lower lumbar spine at the edge of the imaging field. IMPRESSION: Mildly displaced intertrochanteric comminuted right hip fracture Electronically Signed   By: Jill Side M.D.   On: 08/15/2022 20:45    There are no new results to review at this time.  Assessment and Plan:  * Femur fracture ([place]) In the setting of a mechanical fall with multiple falls in the past.  Discussed with patient's daughter/HCPOA regarding options moving forward.  She understands that patient is high risk for surgery, but she finds it difficult to accept that he would be bedbound without surgery.  I wonder if patient may be a good candidate for spinal block; will discuss with orthopedic surgery.  Revised cardiac risk index elevated at class III risk.  - Orthopedic surgery consulted; appreciate their recommendations - Pain control with Tylenol, Norco and Dilaudid - Bedrest for now - N.p.o. at midnight in the case surgery scheduled  Dementia with behavioral disturbance (HCC) - Continue home memantine and Seroquel - Delirium  precautions  Chronic diastolic CHF (congestive heart failure) ([place]) Patient appears euvolemic.  No indication for IV Lasix at this time.  Type 2 diabetes mellitus  with complication ([place]) - SSI, sensitive - Hold home metformin  Hypertension - Continue home antihypertensives  Advance Care Planning:   Code Status: DNR/DNI.  Signed Gold form in patient's room.  Consults: Orthopedic surgery  Family Communication: Patient's daughter [name] updated via telephone.   Severity of Illness: The appropriate patient status for this patient is INPATIENT. Inpatient status is judged to be reasonable and necessary in order to provide the required intensity of service to ensure the patient's safety. The patient's presenting symptoms, physical exam findings, and initial radiographic and laboratory data in the context of their chronic comorbidities is felt to place them at high risk for further clinical deterioration. Furthermore, it is not anticipated that the patient will be medically stable for discharge from the hospital within 2 midnights of admission.   * I certify that at the point of admission it is my clinical judgment that the patient will require inpatient hospital care spanning beyond 2 midnights from the point of admission due to high intensity of service, high risk for further deterioration and high frequency of surveillance required.*  Author: Jose Persia, MD 08/15/2022 11:21 PM  For on call review [email or web address].

## 2022-08-15 NOTE — ED Notes (Addendum)
Family updated as to patient's status. Daughter, [name] updated with plan of care, admission as well as imaging results per MD Siadecki. Per daughter, she will be at hospital in AM and request any questions to call at anytime with number listed in pts chart.

## 2022-08-15 NOTE — ED Notes (Signed)
Family updated as to patient's status. Daughter, [name] called for update on pts arrival to ED and plan of care as well as updated process. Per Daughter, staff called and informed that pt was walking without walker holding onto railing and fell. Daughter reports continued grievances with facility due to continued falls with pt.

## 2022-08-16 ENCOUNTER — Encounter
Admission: EM | Disposition: A | Discharge status: Skilled Nursing Facility | Payer: Self-pay | Source: Skilled Nursing Facility | Attending: Internal Medicine

## 2022-08-16 ENCOUNTER — Encounter: Payer: Self-pay | Admitting: Internal Medicine

## 2022-08-16 ENCOUNTER — Inpatient Hospital Stay: Payer: Medicare Other | Admitting: General Practice

## 2022-08-16 ENCOUNTER — Other Ambulatory Visit: Payer: Self-pay

## 2022-08-16 ENCOUNTER — Inpatient Hospital Stay: Payer: Medicare Other

## 2022-08-16 DIAGNOSIS — S72144A Nondisplaced intertrochanteric fracture of right femur, initial encounter for closed fracture: Secondary | ICD-10-CM | POA: Diagnosis not present

## 2022-08-16 DIAGNOSIS — E440 Moderate protein-calorie malnutrition: Secondary | ICD-10-CM | POA: Insufficient documentation

## 2022-08-16 HISTORY — PX: INTRAMEDULLARY (IM) NAIL INTERTROCHANTERIC: SHX5875

## 2022-08-16 LAB — BASIC METABOLIC PANEL
Anion gap: 3 — ABNORMAL LOW (ref 5–15)
BUN: 27 mg/dL — ABNORMAL HIGH (ref 8–23)
CO2: 27 mmol/L (ref 22–32)
Calcium: 7.9 mg/dL — ABNORMAL LOW (ref 8.9–10.3)
Chloride: 106 mmol/L (ref 98–111)
Creatinine, Ser: 0.94 mg/dL (ref 0.61–1.24)
GFR, Estimated: >60 mL/min (ref >60)
Glucose, Bld: 154 mg/dL — ABNORMAL HIGH (ref 70–99)
Potassium: 3.8 mmol/L (ref 3.5–5.1)
Sodium: 136 mmol/L (ref 135–145)

## 2022-08-16 LAB — CBC WITH DIFFERENTIAL/PLATELET
Abs Immature Granulocytes: 0.02 10*3/uL (ref 0.00–0.07)
Basophils Absolute: 0 10*3/uL (ref 0.0–0.1)
Basophils Relative: 0 %
Eosinophils Absolute: 0 10*3/uL (ref 0.0–0.5)
Eosinophils Relative: 1 %
HCT: 30.5 % — ABNORMAL LOW (ref 39.0–52.0)
Hemoglobin: 9.8 g/dL — ABNORMAL LOW (ref 13.0–17.0)
Immature Granulocytes: 0 %
Lymphocytes Relative: 12 %
Lymphs Abs: 0.9 10*3/uL (ref 0.7–4.0)
MCH: 28.7 pg (ref 26.0–34.0)
MCHC: 32.1 g/dL (ref 30.0–36.0)
MCV: 89.2 fL (ref 80.0–100.0)
Monocytes Absolute: 0.7 10*3/uL (ref 0.1–1.0)
Monocytes Relative: 9 %
Neutro Abs: 5.6 10*3/uL (ref 1.7–7.7)
Neutrophils Relative %: 78 %
Platelets: 261 10*3/uL (ref 150–400)
RBC: 3.42 MIL/uL — ABNORMAL LOW (ref 4.22–5.81)
RDW: 13.7 % (ref 11.5–15.5)
WBC: 7.2 10*3/uL (ref 4.0–10.5)
nRBC: 0 % (ref 0.0–0.2)

## 2022-08-16 LAB — GLUCOSE, CAPILLARY
Glucose-Capillary: 143 mg/dL — ABNORMAL HIGH (ref 70–99)
Glucose-Capillary: 94 mg/dL (ref 70–99)
Glucose-Capillary: 65 mg/dL — ABNORMAL LOW (ref 70–99)
Glucose-Capillary: 165 mg/dL — ABNORMAL HIGH (ref 70–99)

## 2022-08-16 LAB — SURGICAL PCR SCREEN
MRSA, PCR: NEGATIVE
Staphylococcus aureus: NEGATIVE

## 2022-08-16 SURGERY — FIXATION, FRACTURE, INTERTROCHANTERIC, WITH INTRAMEDULLARY ROD
Anesthesia: General | Site: Hip | Laterality: Right

## 2022-08-16 MED ORDER — ADULT MULTIVITAMIN W/MINERALS CH
1.0000 | ORAL_TABLET | Freq: Every day | ORAL | Status: DC
  Administered 2022-08-17 – 2022-08-22 (×6): 1 via ORAL
  Filled 2022-08-16 (×6): qty 1

## 2022-08-16 MED ORDER — BISACODYL 10 MG RE SUPP
10.0000 mg | Freq: Every day | RECTAL | Status: DC | PRN
  Administered 2022-08-19: 10 mg via RECTAL
  Filled 2022-08-16: qty 1

## 2022-08-16 MED ORDER — TRANEXAMIC ACID 1000 MG/10ML IV SOLN
INTRAVENOUS | Status: AC
  Filled 2022-08-16: qty 10

## 2022-08-16 MED ORDER — PHENYLEPHRINE 80 MCG/ML (10ML) SYRINGE FOR IV PUSH (FOR BLOOD PRESSURE SUPPORT)
PREFILLED_SYRINGE | INTRAVENOUS | Status: DC | PRN
  Administered 2022-08-16: 120 ug via INTRAVENOUS

## 2022-08-16 MED ORDER — OXYCODONE HCL 5 MG PO TABS: 5.0000 mg | ORAL_TABLET | Freq: Once | ORAL | Status: DC | PRN

## 2022-08-16 MED ORDER — LACTATED RINGERS IV SOLN: INTRAVENOUS | Status: DC

## 2022-08-16 MED ORDER — DOCUSATE SODIUM 100 MG PO CAPS
100.0000 mg | ORAL_CAPSULE | Freq: Two times a day (BID) | ORAL | Status: DC
  Administered 2022-08-17 – 2022-08-22 (×11): 100 mg via ORAL
  Filled 2022-08-16 (×10): qty 1

## 2022-08-16 MED ORDER — METOCLOPRAMIDE HCL 5 MG PO TABS: 5.0000 mg | ORAL_TABLET | Freq: Three times a day (TID) | ORAL | Status: DC | PRN

## 2022-08-16 MED ORDER — BUPIVACAINE HCL (PF) 0.25 % IJ SOLN
INTRAMUSCULAR | Status: AC
  Filled 2022-08-16: qty 30

## 2022-08-16 MED ORDER — SUCCINYLCHOLINE CHLORIDE 200 MG/10ML IV SOSY
PREFILLED_SYRINGE | INTRAVENOUS | Status: DC | PRN
  Administered 2022-08-16: 60 mg via INTRAVENOUS

## 2022-08-16 MED ORDER — BUPIVACAINE-EPINEPHRINE (PF) 0.25% -1:200000 IJ SOLN
INTRAMUSCULAR | Status: DC | PRN
  Administered 2022-08-16: 30 mL via PERINEURAL

## 2022-08-16 MED ORDER — ROCURONIUM BROMIDE 100 MG/10ML IV SOLN
INTRAVENOUS | Status: DC | PRN
  Administered 2022-08-16: 20 mg via INTRAVENOUS

## 2022-08-16 MED ORDER — FENTANYL CITRATE (PF) 100 MCG/2ML IJ SOLN: 25.0000 ug | INTRAMUSCULAR | Status: DC | PRN

## 2022-08-16 MED ORDER — TRANEXAMIC ACID-NACL 1000-0.7 MG/100ML-% IV SOLN
1000.0000 mg | INTRAVENOUS | Status: AC
  Filled 2022-08-16: qty 100

## 2022-08-16 MED ORDER — METHOCARBAMOL 1000 MG/10ML IJ SOLN: 500.0000 mg | Freq: Four times a day (QID) | INTRAVENOUS | Status: DC | PRN

## 2022-08-16 MED ORDER — ACETAMINOPHEN 10 MG/ML IV SOLN
INTRAVENOUS | Status: DC | PRN
  Administered 2022-08-16: 1000 mg via INTRAVENOUS

## 2022-08-16 MED ORDER — CEFAZOLIN SODIUM-DEXTROSE 2-4 GM/100ML-% IV SOLN: 2.0000 g | INTRAVENOUS | Status: DC

## 2022-08-16 MED ORDER — ONDANSETRON HCL 4 MG/2ML IJ SOLN: 4.0000 mg | Freq: Four times a day (QID) | INTRAMUSCULAR | Status: DC | PRN

## 2022-08-16 MED ORDER — ONDANSETRON HCL 4 MG/2ML IJ SOLN: 4.0000 mg | Freq: Once | INTRAMUSCULAR | Status: DC | PRN

## 2022-08-16 MED ORDER — FENTANYL CITRATE (PF) 100 MCG/2ML IJ SOLN
INTRAMUSCULAR | Status: AC
  Filled 2022-08-16: qty 2

## 2022-08-16 MED ORDER — CEFAZOLIN SODIUM-DEXTROSE 2-4 GM/100ML-% IV SOLN
2.0000 g | Freq: Three times a day (TID) | INTRAVENOUS | Status: AC
  Administered 2022-08-17 (×2): 2 g via INTRAVENOUS
  Filled 2022-08-16 (×2): qty 100

## 2022-08-16 MED ORDER — MAGNESIUM CITRATE PO SOLN: 1.0000 | Freq: Once | ORAL | Status: DC | PRN

## 2022-08-16 MED ORDER — CEFAZOLIN SODIUM-DEXTROSE 2-3 GM-%(50ML) IV SOLR
INTRAVENOUS | Status: DC | PRN
  Administered 2022-08-16: 2 g via INTRAVENOUS

## 2022-08-16 MED ORDER — DEXAMETHASONE SODIUM PHOSPHATE 10 MG/ML IJ SOLN
INTRAMUSCULAR | Status: DC | PRN
  Administered 2022-08-16: 10 mg via INTRAVENOUS

## 2022-08-16 MED ORDER — MUPIROCIN 2 % EX OINT
1.0000 | TOPICAL_OINTMENT | Freq: Two times a day (BID) | CUTANEOUS | Status: AC
  Administered 2022-08-17 – 2022-08-21 (×8): 1 via NASAL
  Filled 2022-08-16 (×2): qty 22

## 2022-08-16 MED ORDER — ACETAMINOPHEN 10 MG/ML IV SOLN: 1000.0000 mg | Freq: Once | INTRAVENOUS | Status: DC | PRN

## 2022-08-16 MED ORDER — OXYCODONE HCL 5 MG/5ML PO SOLN: 5.0000 mg | Freq: Once | ORAL | Status: DC | PRN

## 2022-08-16 MED ORDER — DEXTROSE 50 % IV SOLN
INTRAVENOUS | Status: AC
  Administered 2022-08-16: 50 mL via INTRAVENOUS
  Filled 2022-08-16: qty 50

## 2022-08-16 MED ORDER — ONDANSETRON HCL 4 MG PO TABS: 4.0000 mg | ORAL_TABLET | Freq: Four times a day (QID) | ORAL | Status: DC | PRN

## 2022-08-16 MED ORDER — ONDANSETRON HCL 4 MG/2ML IJ SOLN
INTRAMUSCULAR | Status: DC | PRN
  Administered 2022-08-16: 4 mg via INTRAVENOUS

## 2022-08-16 MED ORDER — EPINEPHRINE PF 1 MG/ML IJ SOLN
INTRAMUSCULAR | Status: AC
  Filled 2022-08-16: qty 1

## 2022-08-16 MED ORDER — PROPOFOL 10 MG/ML IV BOLUS
INTRAVENOUS | Status: DC | PRN
  Administered 2022-08-16: 20 mg via INTRAVENOUS

## 2022-08-16 MED ORDER — PHENYLEPHRINE HCL-NACL 20-0.9 MG/250ML-% IV SOLN
INTRAVENOUS | Status: AC
  Filled 2022-08-16: qty 250

## 2022-08-16 MED ORDER — SUGAMMADEX SODIUM 200 MG/2ML IV SOLN
INTRAVENOUS | Status: DC | PRN
  Administered 2022-08-16: 150 mg via INTRAVENOUS

## 2022-08-16 MED ORDER — ENSURE ENLIVE PO LIQD
237.0000 mL | Freq: Three times a day (TID) | ORAL | Status: DC
  Administered 2022-08-17 – 2022-08-22 (×14): 237 mL via ORAL

## 2022-08-16 MED ORDER — CEFAZOLIN SODIUM 1 G IJ SOLR
INTRAMUSCULAR | Status: AC
  Filled 2022-08-16: qty 40

## 2022-08-16 MED ORDER — FENTANYL CITRATE (PF) 100 MCG/2ML IJ SOLN
INTRAMUSCULAR | Status: DC | PRN
  Administered 2022-08-16 (×2): 25 ug via INTRAVENOUS
  Administered 2022-08-16: 50 ug via INTRAVENOUS

## 2022-08-16 MED ORDER — ACETAMINOPHEN 10 MG/ML IV SOLN
INTRAVENOUS | Status: AC
  Filled 2022-08-16: qty 100

## 2022-08-16 MED ORDER — TRANEXAMIC ACID 1000 MG/10ML IV SOLN
INTRAVENOUS | Status: DC | PRN
  Administered 2022-08-16: 1000 mg via INTRAVENOUS

## 2022-08-16 MED ORDER — ETOMIDATE 2 MG/ML IV SOLN
INTRAVENOUS | Status: DC | PRN
  Administered 2022-08-16: 16 mg via INTRAVENOUS

## 2022-08-16 MED ORDER — PHENYLEPHRINE HCL-NACL 20-0.9 MG/250ML-% IV SOLN
INTRAVENOUS | Status: DC | PRN
  Administered 2022-08-16: 20 ug/min via INTRAVENOUS

## 2022-08-16 MED ORDER — LACTATED RINGERS IV SOLN: INTRAVENOUS | Status: AC

## 2022-08-16 MED ORDER — 0.9 % SODIUM CHLORIDE (POUR BTL) OPTIME
TOPICAL | Status: DC | PRN
  Administered 2022-08-16: 500 mL

## 2022-08-16 MED ORDER — METOCLOPRAMIDE HCL 5 MG/ML IJ SOLN: 5.0000 mg | Freq: Three times a day (TID) | INTRAMUSCULAR | Status: DC | PRN

## 2022-08-16 MED ORDER — POLYETHYLENE GLYCOL 3350 17 G PO PACK: 17.0000 g | PACK | Freq: Every day | ORAL | Status: DC | PRN

## 2022-08-16 MED ORDER — DIPHENHYDRAMINE HCL 12.5 MG/5ML PO ELIX
12.5000 mg | ORAL_SOLUTION | ORAL | Status: DC | PRN
  Administered 2022-08-20: 25 mg via ORAL
  Filled 2022-08-16: qty 10

## 2022-08-16 MED ORDER — LIDOCAINE HCL (CARDIAC) PF 100 MG/5ML IV SOSY
PREFILLED_SYRINGE | INTRAVENOUS | Status: DC | PRN
  Administered 2022-08-16: 60 mg via INTRAVENOUS

## 2022-08-16 MED ORDER — LACTATED RINGERS IV SOLN: INTRAVENOUS | Status: DC | PRN

## 2022-08-16 MED ORDER — METHOCARBAMOL 500 MG PO TABS
500.0000 mg | ORAL_TABLET | Freq: Four times a day (QID) | ORAL | Status: DC | PRN
  Administered 2022-08-17: 500 mg via ORAL
  Filled 2022-08-16: qty 1

## 2022-08-16 MED ORDER — DEXTROSE 50 % IV SOLN: 1.0000 | Freq: Once | INTRAVENOUS | Status: AC

## 2022-08-16 SURGICAL SUPPLY — 41 items
APL PRP STRL LF DISP 70% ISPRP (MISCELLANEOUS) ×1
BIT DRILL CANN 16 HIP (BIT) IMPLANT
BIT DRILL CANN STP 6/9 HIP (BIT) IMPLANT
BLADE TFNA HELICA 100 (Anchor) IMPLANT
BNDG CMPR 5X4 CHSV STRCH STRL (GAUZE/BANDAGES/DRESSINGS) ×2
BNDG COHESIVE 4X5 TAN STRL LF (GAUZE/BANDAGES/DRESSINGS) IMPLANT
BRUSH SCRUB EZ  4% CHG (MISCELLANEOUS) ×2
BRUSH SCRUB EZ 4% CHG (MISCELLANEOUS) ×2 IMPLANT
CHLORAPREP W/TINT 26 (MISCELLANEOUS) ×1 IMPLANT
DRAPE 3/4 80X56 (DRAPES) ×1 IMPLANT
DRAPE U-SHAPE 47X51 STRL (DRAPES) ×1 IMPLANT
DRSG AQUACEL AG ADV 3.5X 4 (GAUZE/BANDAGES/DRESSINGS) IMPLANT
DRSG AQUACEL AG ADV 3.5X10 (GAUZE/BANDAGES/DRESSINGS) ×1 IMPLANT
ELECT REM PT RETURN 9FT ADLT (ELECTROSURGICAL) ×1 IMPLANT
ELECTRODE REM PT RTRN 9FT ADLT (ELECTROSURGICAL) ×1 IMPLANT
GAUZE XEROFORM 1X8 LF (GAUZE/BANDAGES/DRESSINGS) ×1 IMPLANT
GLOVE BIOGEL PI IND STRL 8.5 (GLOVE) ×1 IMPLANT
GLOVE SURG ORTHO 8.5 STRL (GLOVE) ×1 IMPLANT
GOWN STRL REUS W/ TWL LRG LVL3 (GOWN DISPOSABLE) ×1 IMPLANT
GOWN STRL REUS W/ TWL XL LVL3 (GOWN DISPOSABLE) ×1 IMPLANT
GOWN STRL REUS W/TWL LRG LVL3 (GOWN DISPOSABLE) ×1
GOWN STRL REUS W/TWL XL LVL3 (GOWN DISPOSABLE) ×1
GUIDEWIRE 3.2X400 (WIRE) IMPLANT
KIT PATIENT CARE HANA TABLE (KITS) ×1 IMPLANT
KIT TURNOVER CYSTO (KITS) ×1 IMPLANT
MANIFOLD NEPTUNE II (INSTRUMENTS) ×1 IMPLANT
MAT ABSORB  FLUID 56X50 GRAY (MISCELLANEOUS) ×1
MAT ABSORB FLUID 56X50 GRAY (MISCELLANEOUS) ×1 IMPLANT
NAIL CAN TFNA 9 130D 400 RT (Nail) IMPLANT
NDL SPNL 20GX3.5 QUINCKE YW (NEEDLE) ×1 IMPLANT
NEEDLE SPNL 20GX3.5 QUINCKE YW (NEEDLE) ×1 IMPLANT
NS IRRIG 1000ML POUR BTL (IV SOLUTION) ×1 IMPLANT
PACK HIP COMPR (MISCELLANEOUS) ×1 IMPLANT
STAPLER SKIN PROX 35W (STAPLE) ×1 IMPLANT
SUT VIC AB 0 CT1 36 (SUTURE) ×1 IMPLANT
SUT VIC AB 2-0 CT1 27 (SUTURE) ×2
SUT VIC AB 2-0 CT1 TAPERPNT 27 (SUTURE) ×2 IMPLANT
SYR 30ML LL (SYRINGE) ×1 IMPLANT
TOWEL OR 17X26 4PK STRL BLUE (TOWEL DISPOSABLE) ×1 IMPLANT
TRAP FLUID SMOKE EVACUATOR (MISCELLANEOUS) ×1 IMPLANT
WATER STERILE IRR 500ML POUR (IV SOLUTION) ×1 IMPLANT

## 2022-08-16 NOTE — Anesthesia Preprocedure Evaluation (Addendum)
Anesthesia Evaluation  Patient identified by MRN, date of birth, ID band Patient awake and Patient confused    Reviewed: Allergy & Precautions, NPO status , Patient's Chart, lab work & pertinent test results  History of Anesthesia Complications Negative for: history of anesthetic complications  Airway Mallampati: IV   Neck ROM: Full    Dental  (+) Missing   Pulmonary former smoker   Pulmonary exam normal breath sounds clear to auscultation       Cardiovascular hypertension, + CAD (s/p stent) and +CHF (diastolic)  + Valvular Problems/Murmurs (severe AS)  Rhythm:Regular Rate:Normal + Systolic murmurs ECG 03/19/39: NSR, LAD, RBBB  Echo 05/03/22:  1. Left ventricular ejection fraction, by estimation, is 60 to 65%. The left ventricle has normal function. The left ventricle has no regional wall motion abnormalities. There is moderate left ventricular hypertrophy. Left ventricular diastolic parameters are consistent with Grade I diastolic dysfunction (impaired relaxation). Elevated left atrial pressure.   2. Right ventricular systolic function is normal. The right ventricular size is normal.   3. The mitral valve is normal in structure. No evidence of mitral valve regurgitation. No evidence of mitral stenosis.   4. The tricuspid valve is abnormal.   5. The aortic valve is tricuspid. There is severe calcifcation of the aortic valve. There is severe thickening of the aortic valve. Aortic valve regurgitation is moderate. Moderate aortic valve stenosis. Aortic valve mean gradient measures 23.2 mmHg. Aortic valve peak gradient measures 38.5 mmHg. Aortic valve area, by VTI measures 1.01 cm.   6. The inferior vena cava is normal in size with greater than 50% respiratory variability, suggesting right atrial pressure of 3 mmHg.    Neuro/Psych  PSYCHIATRIC DISORDERS     Dementia negative neurological ROS     GI/Hepatic negative GI ROS,,,   Endo/Other  diabetes, Type 2    Renal/GU negative Renal ROS   BPH    Musculoskeletal  (+) Arthritis ,    Abdominal   Peds  Hematology negative hematology ROS (+)   Anesthesia Other Findings Cardiology note 05/12/22:  1. Severe Aortic Valve Stenosis: He has moderate to severe aortic valve stenosis. I have personally reviewed the echo images. The aortic valve is thickened and calcified but the leaflets seem to open. His measurements are consistent with moderate stenosis and this is consistent with the visual appearance of his valve. I would not consider TAVR at this point given his advanced age and degree of AS. I do not think TAVR would improve his overall quality of life and in fact may shorten his life if there were complications with the procedure. The patient and his daughter understand this. I would not repeat his echo as he is not a candidate for future invasive cardiac procedures given age and functional status.   Reproductive/Obstetrics                             Anesthesia Physical Anesthesia Plan  ASA: 4  Anesthesia Plan: General   Post-op Pain Management:    Induction: Intravenous  PONV Risk Score and Plan: 2 and Ondansetron, Dexamethasone and Treatment may vary due to age or medical condition  Airway Management Planned: Oral ETT  Additional Equipment:   Intra-op Plan:   Post-operative Plan: Extubation in OR  Informed Consent: I have reviewed the patients History and Physical, chart, labs and discussed the procedure including the risks, benefits and alternatives for the proposed anesthesia with the patient or  authorized representative who has indicated his/her understanding and acceptance.   Patient has DNR.  Discussed DNR with power of attorney and Continue DNR.   Dental advisory given and Consent reviewed with POA  Plan Discussed with: CRNA  Anesthesia Plan Comments: (Patient's daughter and POA [name] at bedside consented for  risks of anesthesia including but not limited to:  - adverse reactions to medications - damage to eyes, teeth, lips or other oral mucosa - nerve damage due to positioning  - sore throat or hoarseness - damage to heart, brain, nerves, lungs, other parts of body or loss of life  Informed patient's daughter about role of CRNA in peri- and intra-operative care; she voiced understanding.  Patient's daughter would like DNR to be continued intraoperatively.)        Anesthesia Quick Evaluation

## 2022-08-16 NOTE — Anesthesia Postprocedure Evaluation (Signed)
Anesthesia Post Note  Patient: Jordan [name]  Procedure(s) Performed: INTRAMEDULLARY (IM) NAIL INTERTROCHANTERIC (Right: Hip)  Patient location during evaluation: PACU Anesthesia Type: General Level of consciousness: awake and alert, oriented, patient cooperative and confused (consciousness returned to preop baseline) Pain management: pain level controlled Vital Signs Assessment: post-procedure vital signs reviewed and stable Respiratory status: spontaneous breathing, nonlabored ventilation and respiratory function stable Cardiovascular status: blood pressure returned to baseline and stable Postop Assessment: adequate PO intake Anesthetic complications: no   No notable events documented.   Last Vitals:  Vitals:   08/16/22 2230 08/16/22 2231  BP: (!) 171/65 (!) 163/52  Pulse: 66 77  Resp: 14 13  Temp:  37.1 C  SpO2: 99% 99%    Last Pain:  Vitals:   08/16/22 2231  TempSrc:   PainSc: Asleep                 Darrin Nipper

## 2022-08-16 NOTE — Op Note (Signed)
DATE OF SURGERY:  08/16/2022  TIME: 10:06 PM  PATIENT NAME:  Jordan [name]  AGE: 89 y.o.  PRE-OPERATIVE DIAGNOSIS:  Right Hip Fracture  POST-OPERATIVE DIAGNOSIS:  SAME  PROCEDURE:  Right INTRAMEDULLARY (IM) NAIL INTERTROCHANTERIC  SURGEON:  Lovell Sheehan  EBL:  767 cc  COMPLICATIONS:  none apparent  OPERATIVE IMPLANTS: Synthes trochanteric femoral nail  400 mm by 9 mm  with interlocking helical blade  209 mm  PREOPERATIVE INDICATIONS:  Adric Wrede Bekker is a 89 y.o. year old who fell and suffered a hip fracture. He was brought into the ER and then admitted and optimized and then elected for surgical intervention.    The risks benefits and alternatives were discussed with the patient including but not limited to the risks of nonoperative treatment, versus surgical intervention including infection, bleeding, nerve injury, malunion, nonunion, hardware prominence, hardware failure, need for hardware removal, blood clots, cardiopulmonary complications, morbidity, mortality, among others, and they were willing to proceed.    OPERATIVE PROCEDURE:  The patient was brought to the operating room and placed in the supine position.  General anesthesia was administered. He was placed on the fracture table.  Closed reduction was performed under C-arm guidance. The length of the femur was also measured using fluoroscopy. Time out was then performed after sterile prep and drape. He received preoperative antibiotics.  Incision was made proximal to the greater trochanter. A guidewire was placed in the appropriate position. Confirmation was made on AP and lateral views. The above-named nail was opened. I opened the proximal femur with a reamer. I then placed the nail by hand easily down. I did not need to ream the femur.  Once the nail was completely seated, I placed a guidepin into the femoral head into the center center position through a second incision.  I measured the length, and then  reamed the lateral cortex and up into the head. I then placed the helical blade. Slight compression was applied. Anatomic fixation achieved. Bone quality was poor.  I then secured the proximal interlock.  I then removed the instruments, and took final C-arm pictures AP and lateral the entire length of the leg. Anatomic reconstruction was achieved, and the wounds were irrigated copiously and closed with Vicryl  followed by staples and dry sterile dressing. Sponge and needle count were correct.   The patient was awakened and returned to PACU in stable and satisfactory condition. There no complications and the patient tolerated the procedure well.  He will be weightbearing as tolerated.    Lovell Sheehan

## 2022-08-16 NOTE — Progress Notes (Signed)
Initial Nutrition Assessment  DOCUMENTATION CODES:   Underweight, Non-severe (moderate) malnutrition in context of chronic illness  INTERVENTION:   Once diet is advanced, add:   -Ensure Enlive po TID, each supplement provides 350 kcal and 20 grams of protein -MVI with minerals daily  NUTRITION DIAGNOSIS:   Moderate Malnutrition related to chronic illness (dementia) as evidenced by mild fat depletion, moderate fat depletion, mild muscle depletion, moderate muscle depletion.  GOAL:   Patient will meet greater than or equal to 90% of their needs  MONITOR:   PO intake, Supplement acceptance, Diet advancement  REASON FOR ASSESSMENT:   Consult Assessment of nutrition requirement/status, Hip fracture protocol  ASSESSMENT:   Pt with medical history significant of dementia, CAD s/p DES to RCA (2009), HFpEF, hypertension, hyperlipidemia, type 2 diabetes, severe aortic stenosis, lymphocytic colitis, BPH who presents after a fall.  Pt admitted with femur fracture.   Reviewed I/O's: -150 ml x 24 hours   UOP: 250 ml x 24 hours   Pt NPO for potential surgical procedure. Orthopedics consult pending.   Spoke with pt at bedside, who is very [place]. Per chart, pt resides at [facility], but thinks he lives at his house in [place]. Jewell attempted to reorient. Pt states good appetite PTA, but "has slowed down over the past 3 months because I stopped shooting birds". Pt also calling out for someone named Mortimer Fries; he was unable to tell this RD what his relation to Mortimer Fries was or what he needed.   Reviewed wt hx; pt has experienced a 6.5% wt loss over the past 3 months, which is not significant for time frame.   Medications reviewed and include lactated ringers infusion @ 75 ml/hr.   Lab Results  Component Value Date   HGBA1C 7.2 (H) 06/22/2022   PTA DM medications are 500 mg metformin daily.   Labs reviewed: CBGS: 94-143 (inpatient orders for glycemic control are 0-9 units insulin aspart  TID).    NUTRITION - FOCUSED PHYSICAL EXAM:  Flowsheet Row Most Recent Value  Orbital Region No depletion  Upper Arm Region Moderate depletion  Thoracic and Lumbar Region Mild depletion  Buccal Region Mild depletion  Temple Region Moderate depletion  Clavicle Bone Region Moderate depletion  Clavicle and Acromion Bone Region Moderate depletion  Scapular Bone Region Moderate depletion  Dorsal Hand Moderate depletion  Patellar Region Moderate depletion  Anterior Thigh Region Moderate depletion  Posterior Calf Region Moderate depletion  Edema (RD Assessment) None  Hair Reviewed  Eyes Reviewed  Mouth Reviewed  Skin Reviewed  Nails Reviewed       Diet Order:   Diet Order             Diet NPO time specified  Diet effective midnight                   EDUCATION NEEDS:   Not appropriate for education at this time  Skin:  Skin Assessment: Reviewed RN Assessment  Last BM:  Unknown  Height:   Ht Readings from Last 1 Encounters:  08/15/22 5\' 9"  (1.753 m)    Weight:   Wt Readings from Last 1 Encounters:  08/15/22 55 kg    Ideal Body Weight:  72.7 kg  BMI:  Body mass index is 17.91 kg/m.  Estimated Nutritional Needs:   Kcal:  1700-1900  Protein:  85-100 grams  Fluid:  > 1.7 L    Loistine Chance, RD, LDN, [place] Registered Dietitian II Certified Diabetes Care and Education  Specialist Please refer to [facility] for RD and/or RD on-call/weekend/after hours pager

## 2022-08-16 NOTE — Progress Notes (Signed)
CSW called pt's dtr, [name], to complete assessment.  She will call back.

## 2022-08-16 NOTE — Transfer of Care (Signed)
Immediate Anesthesia Transfer of Care Note  Patient: Jordan [name]  Procedure(s) Performed: INTRAMEDULLARY (IM) NAIL INTERTROCHANTERIC (Right: Hip)  Patient Location: PACU  Anesthesia Type:General  Level of Consciousness: drowsy  Airway & Oxygen Therapy: Patient Spontanous Breathing and Patient connected to face mask oxygen  Post-op Assessment: Report given to RN, Post -op Vital signs reviewed and stable, and Patient moving all extremities  Post vital signs: Reviewed and stable  Last Vitals:  Vitals Value Taken Time  BP 120/75 08/16/22 2205  Temp 36.1 C 08/16/22 2205  Pulse 69 08/16/22 2211  Resp 13 08/16/22 2211  SpO2 99 % 08/16/22 2211  Vitals shown include unvalidated device data.  Last Pain:  Vitals:   08/16/22 0001  TempSrc: Oral         Complications: No notable events documented.

## 2022-08-16 NOTE — Progress Notes (Addendum)
Progress Note    Jordan [name]  GYI:[id number] DOB: [birth year]  DOA: 08/15/2022 PCP: Asencion Noble, MD      Brief Narrative:    Medical records reviewed and are as summarized below:  Jordan [name] is a 89 y.o. male with medical history significant of dementia, CAD s/p DES to RCA (2009), HFpEF, hypertension, hyperlipidemia, type 2 diabetes, severe aortic stenosis, lymphocytic colitis, BPH, who presented from [place] home to the hospital after a fall.  Workup revealed right hip fracture.    Assessment/Plan:   Principal Problem:   Femur fracture (HCC) Active Problems:   Dementia with behavioral disturbance (HCC)   Chronic diastolic CHF (congestive heart failure) (HCC)   Type 2 diabetes mellitus with complication (HCC)   Hypertension   Body mass index is 17.91 kg/m.   Mildly displaced intertrochanteric comminuted right hip fracture, s/p fall: Analgesics as needed for pain.  Consulted Dr. Harlow Mares, orthopedic surgeon. Patient is NPO.  Ordered IV fluids for hydration.  Dementia with behavioral disturbance: Continue memantine and Seroquel.   Chronic diastolic CHF, severe aortic stenosis: Compensated.  2D echo in October 2023 showed EF estimated at 60 to 65%, moderate LVH, grade 1 diastolic dysfunction, moderate aortic regurgitation, moderate to severe aortic stenosis   Type II DM: Metformin on hold.  NovoLog as needed for hyperglycemia.   Other comorbidities include hypertension on metoprolol, lymphocytic colitis on budesonide     Diet Order             Diet NPO time specified  Diet effective midnight                            Consultants: Orthopedic surgeon  Procedures: None    Medications:    budesonide  3 mg Oral q AM   finasteride  5 mg Oral Daily   insulin aspart  0-9 Units Subcutaneous TID WC   memantine  10 mg Oral BID   metoprolol tartrate  12.5 mg Oral BID   mirabegron ER  25 mg Oral q AM    pravastatin  80 mg Oral q1800   QUEtiapine  75 mg Oral QHS   Continuous Infusions:  lactated ringers       Anti-infectives (From admission, onward)    None              Family Communication/Anticipated D/C date and plan/Code Status   DVT prophylaxis:      Code Status: DNR  Family Communication: Plan discussed with [name], daughter, over the phone Disposition Plan: Plan to discharge to SNF in 3 to 5 days   Status is: Inpatient Remains inpatient appropriate because: Right hip fracture       Subjective:   Interval events noted.  He complains of pain in the right hip.  Objective:    Vitals:   08/15/22 2100 08/15/22 2300 08/16/22 0001 08/16/22 0844  BP: (!) 159/62 (!) 138/53 (!) 153/70 136/62  Pulse: 68 77 79 75  Resp: 16 19 17 16   Temp:   98 F (36.7 C) (!) 97.5 F (36.4 C)  TempSrc:   Oral   SpO2: 100% 100% 100% 99%  Weight:      Height:       No data found.   Intake/Output Summary (Last 24 hours) at 08/16/2022 1033 Last data filed at 08/16/2022 0855 Gross per 24 hour  Intake 100 ml  Output 450 ml  Net -350  ml   Filed Weights   08/15/22 2007  Weight: 55 kg    Exam:  GEN: NAD SKIN: Skin tears on the left shoulder, right elbow, left elbow, bruises on bilateral knees EYES: No pallor or icterus ENT: MMM CV: RRR PULM: CTA B ABD: soft, ND, NT, +BS CNS: AAO x 1 (person), non focal EXT: Right hip tenderness with mild swelling.  No erythema        Data Reviewed:   I have personally reviewed following labs and imaging studies:  Labs: Labs show the following:   Basic Metabolic Panel: Recent Labs  Lab 08/15/22 2010 08/16/22 0602  NA 138 136  K 3.8 3.8  CL 103 106  CO2 25 27  GLUCOSE 211* 154*  BUN 28* 27*  CREATININE 1.06 0.94  CALCIUM 8.2* 7.9*   GFR Estimated Creatinine Clearance: 34.1 mL/min (by C-G formula based on SCr of 0.94 mg/dL). Liver Function Tests: No results for input(s): "AST", "ALT", "ALKPHOS",  "BILITOT", "PROT", "ALBUMIN" in the last 168 hours. No results for input(s): "LIPASE", "AMYLASE" in the last 168 hours. No results for input(s): "AMMONIA" in the last 168 hours. Coagulation profile No results for input(s): "INR", "PROTIME" in the last 168 hours.  CBC: Recent Labs  Lab 08/15/22 2010 08/16/22 0602  WBC 6.0 7.2  NEUTROABS  --  5.6  HGB 10.2* 9.8*  HCT 33.0* 30.5*  MCV 92.7 89.2  PLT 297 261   Cardiac Enzymes: No results for input(s): "CKTOTAL", "CKMB", "CKMBINDEX", "TROPONINI" in the last 168 hours. BNP (last 3 results) No results for input(s): "PROBNP" in the last 8760 hours. CBG: Recent Labs  Lab 08/16/22 0851  GLUCAP 143*   D-Dimer: No results for input(s): "DDIMER" in the last 72 hours. Hgb A1c: No results for input(s): "HGBA1C" in the last 72 hours. Lipid Profile: No results for input(s): "CHOL", "HDL", "LDLCALC", "TRIG", "CHOLHDL", "LDLDIRECT" in the last 72 hours. Thyroid function studies: No results for input(s): "TSH", "T4TOTAL", "T3FREE", "THYROIDAB" in the last 72 hours.  Invalid input(s): "FREET3" Anemia work up: No results for input(s): "VITAMINB12", "FOLATE", "FERRITIN", "TIBC", "IRON", "RETICCTPCT" in the last 72 hours. Sepsis Labs: Recent Labs  Lab 08/15/22 2010 08/16/22 0602  WBC 6.0 7.2    Microbiology No results found for this or any previous visit (from the past 240 hour(s)).  Procedures and diagnostic studies:  DG Chest 1 View  Result Date: 08/15/2022 CLINICAL DATA:  Fall. EXAM: CHEST  1 VIEW COMPARISON:  07/08/2022 FINDINGS: Low lung volumes.The cardiomediastinal contours are normal. The lungs are clear. Pulmonary vasculature is normal. No consolidation, pleural effusion, or pneumothorax. No acute osseous abnormalities are seen. IMPRESSION: Low lung volumes without acute abnormality. Electronically Signed   By: Keith Rake M.D.   On: 08/15/2022 20:46   DG Hip Unilat W or Wo Pelvis 2-3 Views Right  Result Date:  08/15/2022 CLINICAL DATA:  Pain after fall EXAM: DG HIP (WITH OR WITHOUT PELVIS) 2 V RIGHT COMPARISON:  None Available. FINDINGS: Mildly displaced intertrochanteric hip fracture on the right. Comminuted. No additional fracture or dislocation. Preserved joint spaces. Only mild degenerative changes of the sacroiliac joints. Hyperostosis. Osteopenia. Scattered vascular calcifications. Degenerative changes seen of the lower lumbar spine at the edge of the imaging field. IMPRESSION: Mildly displaced intertrochanteric comminuted right hip fracture Electronically Signed   By: Jill Side M.D.   On: 08/15/2022 20:45               LOS: 1 day   Taiki Buckwalter  [employer], advertising on [email or web address]. If 7PM-7AM, please contact night-coverage at [email or web address]     08/16/2022, 10:33 AM

## 2022-08-16 NOTE — Progress Notes (Signed)
Hypoglycemic Event  CBG: 65  Treatment: D50 50 mL (25 gm)  Symptoms: Pale and Shaky  Follow-up CBG: Time:2234 CBG Result:165  Possible Reasons for Event: Other: surgery, NPO prior to  Comments/MD notified:Anesthesia notiifed, new orders received    Abram Sander, Monica Martinez

## 2022-08-16 NOTE — Consult Note (Signed)
ORTHOPAEDIC CONSULTATION  REQUESTING PHYSICIAN: Jennye Boroughs, MD  Chief Complaint: right hip pain  HPI: Jordan [name] is a 89 y.o. male who complains of right hip pain after fall. The pain is sharp in character. The pain is severe and 10/10. The pain is worse with movement and better with rest. Denies any numbness, tingling or constitutional symptoms.  Past Medical History:  Diagnosis Date   Aortic stenosis    Arthritis    BPH (benign prostatic hypertrophy)    CAD (coronary artery disease)    DES to RCA 2009   Diverticulosis of colon    Essential hypertension    Fecal urgency    History of adenomatous polyp of colon    Tubular adenomas 2016   Hyperlipidemia    Lower urinary tract symptoms (LUTS)    Type 2 diabetes mellitus ([facility])    Wears glasses    Past Surgical History:  Procedure Laterality Date   BIOPSY  10/02/2019   Procedure: BIOPSY;  Surgeon: Rogene Houston, MD;  Location: [facility];  Service: Endoscopy;;   CATARACT EXTRACTION W/ INTRAOCULAR LENS  IMPLANT, BILATERAL     COLONOSCOPY  last one 11/16/2014   polpectomy   CORONARY ANGIOPLASTY WITH STENT PLACEMENT  12-23-2007  dr hochrein   (abnormal myoview)  PCI w/ DES x1 pRCA (95%),  pCFX 25%,  mCFX 40-50%,  after PDA 30%,  prox, mid, and ostial LAD  30% calcification,  LM diffuse 25% calcification,  ef 60%   EVALUATION UNDER ANESTHESIA WITH ANAL FISTULECTOMY [name] 06/03/2015   Procedure: ANAL EXAM UNDER ANESTHESIA ,FISTULOTOMY ;  Surgeon: [name], MD;  Location: [facility];  Service: General;  Laterality: [name];   FLEXIBLE SIGMOIDOSCOPY [name] 10/02/2019   Procedure: FLEXIBLE SIGMOIDOSCOPY;  Surgeon: Rogene Houston, MD;  Location: [facility];  Service: Endoscopy;  Laterality: [name];  230   INGUINAL HERNIA REPAIR Right 2001   TRANSTHORACIC ECHOCARDIOGRAM  12-06-2007   normal LVF, ef 60%/  mild to moderate  AV calcification without stenosis,  mild AR and TR,  mild LAE   Social History    Socioeconomic History   Marital status: Widowed    Spouse name: Not on file   Number of children: 2   Years of education: Not on file   Highest education level: Not on file  Occupational History   Occupation: Retired as Associate Professor of Optometrist tobacco  Tobacco Use   Smoking status: Former    Years: 30.00    Types: Cigarettes    Quit date: 06/01/1983    Years since quitting: 39.2   Smokeless tobacco: Never  Vaping Use   Vaping Use: Never used  Substance and Sexual Activity   Alcohol use: No   Drug use: No   Sexual activity: Not on file  Other Topics Concern   Not on file  Social History Narrative   Not on file   Social Determinants of Health   Financial Resource Strain: Not on file  Food Insecurity: No Food Insecurity (08/16/2022)   Hunger Vital Sign    Worried About Running Out of Food in the Last Year: Never true    Ran Out of Food in the Last Year: Never true  Transportation Needs: No Transportation Needs (08/16/2022)   PRAPARE - Hydrologist (Medical): No    Lack of Transportation (Non-Medical): No  Physical Activity: Not on file  Stress: Not on file  Social Connections: Not on file  Family History  Problem Relation Age of Onset   Heart disease Brother    Colon cancer Neg Hx    No Known Allergies Prior to Admission medications   Medication Sig Start Date End Date Taking? Authorizing Provider  aspirin 81 MG chewable tablet Chew 81 mg by mouth in the morning. (0800)   Yes [provider]  budesonide (ENTOCORT EC) 3 MG 24 hr capsule TAKE 1 CAPSULE BY MOUTH DAILY. Patient taking differently: Take 3 mg by mouth in the morning. (0800) 06/12/22  Yes Montez Morita, Quillian Quince, MD  feeding supplement (ENSURE ENLIVE / ENSURE PLUS) LIQD Take 237 mLs by mouth 3 (three) times daily after meals. 07/06/22  Yes [name], Mir M, MD  finasteride (PROSCAR) 5 MG tablet TAKE 1 TABLET BY MOUTH DAILY AS DIRECTED. 08/08/22  Yes [name],  [name], MD  memantine (NAMENDA) 10 MG tablet Take 10 mg by mouth 2 (two) times daily. (0800, 2000)   Yes [provider]  metFORMIN (GLUCOPHAGE) 500 MG tablet Take 500 mg by mouth at bedtime. (2100)   Yes [provider]  metoprolol tartrate (LOPRESSOR) 25 MG tablet Take 0.5 tablets (12.5 mg total) by mouth 2 (two) times daily. Patient taking differently: Take 12.5 mg by mouth 2 (two) times daily. (0800, 2000) 07/06/22 08/15/22 Yes [name], Mir M, MD  mirabegron ER (MYRBETRIQ) 25 MG TB24 tablet Take 1 tablet (25 mg total) by mouth daily. Patient taking differently: Take 25 mg by mouth in the morning. (0900) 03/17/22  Yes [name], [name], MD  Multiple Vitamins-Minerals (PRESERVISION AREDS) TABS Take 1 tablet by mouth in the morning. (0800)   Yes [provider]  pravastatin (PRAVACHOL) 80 MG tablet Take 80 mg by mouth daily at 6 PM.   Yes [provider]  QUEtiapine (SEROQUEL) 25 MG tablet Take 3 tablets (75 mg total) by mouth at bedtime. Patient taking differently: Take 75 mg by mouth at bedtime. (2100) 07/06/22 08/15/22 Yes [name], Mir M, MD  senna (SENOKOT) 8.6 MG TABS tablet Take 1 tablet (8.6 mg total) by mouth 2 (two) times daily. 07/18/22 08/17/22 Yes [name], MD   DG Chest 1 View  Result Date: 08/15/2022 CLINICAL DATA:  Fall. EXAM: CHEST  1 VIEW COMPARISON:  07/08/2022 FINDINGS: Low lung volumes.The cardiomediastinal contours are normal. The lungs are clear. Pulmonary vasculature is normal. No consolidation, pleural effusion, or pneumothorax. No acute osseous abnormalities are seen. IMPRESSION: Low lung volumes without acute abnormality. Electronically Signed   By: Keith Rake M.D.   On: 08/15/2022 20:46   DG Hip Unilat W or Wo Pelvis 2-3 Views Right  Result Date: 08/15/2022 CLINICAL DATA:  Pain after fall EXAM: DG HIP (WITH OR WITHOUT PELVIS) 2 V RIGHT COMPARISON:  None Available. FINDINGS: Mildly displaced intertrochanteric hip  fracture on the right. Comminuted. No additional fracture or dislocation. Preserved joint spaces. Only mild degenerative changes of the sacroiliac joints. Hyperostosis. Osteopenia. Scattered vascular calcifications. Degenerative changes seen of the lower lumbar spine at the edge of the imaging field. IMPRESSION: Mildly displaced intertrochanteric comminuted right hip fracture Electronically Signed   By: Jill Side M.D.   On: 08/15/2022 20:45    Positive ROS: All other systems have been reviewed and were otherwise negative with the exception of those mentioned in the HPI and as above.  Physical Exam: General: Alert, no acute distress Cardiovascular: No pedal edema Respiratory: No cyanosis, no use of accessory musculature GI: No organomegaly, abdomen is soft and non-tender Skin: No lesions  in the area of chief complaint Neurologic: Sensation intact distally Psychiatric: Patient is competent for consent with normal mood and affect Lymphatic: No axillary or cervical lymphadenopathy  MUSCULOSKELETAL: right leg short, externally rotated, pain with IR/ER. Compartments soft. Good cap refill. Motor and sensory intact distally.  Assessment: Right hip intertrochanteric fracture, closed, displaced  Plan: Right hip trochanteric femoral nail.  The diagnosis, risks, benefits and alternatives to treatment are all discussed in detail with the patient and family. Risks include but are not limited to bleeding, infection, deep vein thrombosis, pulmonary embolism, nerve or vascular injury, non-union, repeat operation, persistent pain, weakness, stiffness and death. His daughter Jordan [name] and Jordan [name] understands and is eager to proceed.     Lovell Sheehan, MD    08/16/2022 5:52 PM

## 2022-08-16 NOTE — Progress Notes (Signed)
Patient is alert only to self. He is confused, agitated and refuses certain aspects of care. Convinced patient to allow [place] to take his vitals but he refused q2h turns and would not allow me to remove the dressing from L-shoulder. Spoke with patient's daughter, Jordan [name] [phone] regarding patient's preadmission information. She stated that she is his POA and will deliver paperwork. Jordan [name] reported that patient had been alert and oriented x4 prior to now. Reported that he had been walking with walker and was at [facility] for 1 week before he fell and had to be admitted. ED nurse reported that patient was alert and oriented but that he was becoming increasingly agitated before transfer. Speech is illogical and rambling but clear. Fall precautions implemented and floor mat placed.

## 2022-08-16 NOTE — TOC Initial Note (Signed)
Transition of Care [facility]) - Initial/Assessment Note    Patient Details  Name: Jordan [name] MRN: [id number] Date of Birth: 10-Jul-1923  Transition of Care [facility]) CM/SW Contact:    Quin Hoop, LCSW Phone Number: 08/16/2022, 4:08 PM  Clinical Narrative:                 Assessment completed by pt's dtr, [name], who states that his PCP is Dr. Willey Blade.[name] transports pt to his appts,  His prescriptions are filled at [facility].  [name] states that pt lives alone, but she spends a lot of time with him and she had hired about 4 people to support him.  She states that she's not sure where he'll live and she's not sure if it's safe for him to live alone anymore.   Pt admitted from [facility].  TOC to follow along for discharge planning needs.  Expected Discharge Plan:  (unknown as of right now) Barriers to Discharge: Continued Medical Work up   Patient Goals and CMS Choice Patient states their goals for this hospitalization and ongoing recovery are:: to return home or possibly an assisted living facility per his dtr, [name] [phone])          Expected Discharge Plan and Services       Living arrangements for the past 2 months: Single Family Home                                      Prior Living Arrangements/Services Living arrangements for the past 2 months: Single Family Home Lives with:: Self                   Activities of Daily Living Home Assistive Devices/Equipment: Eyeglasses, Hearing aid, Environmental consultant (specify type) ADL Screening (condition at time of admission) Patient's cognitive ability adequate to safely complete daily activities?: No Is the patient deaf or have difficulty hearing?: Yes Does the patient have difficulty seeing, even when wearing glasses/contacts?: Yes Does the patient have difficulty concentrating, remembering, or making decisions?: Yes Patient able to express need for assistance with ADLs?: Yes Does the patient have difficulty  dressing or bathing?: Yes Independently performs ADLs?: Yes (appropriate for developmental age) Communication: Independent Is this a change from baseline?: Pre-admission baseline Dressing (OT): Needs assistance Is this a change from baseline?: Change from baseline, expected to last >3 days Grooming: Independent Feeding: Independent Bathing: Needs assistance Is this a change from baseline?: Pre-admission baseline Toileting: Needs assistance Is this a change from baseline?: Pre-admission baseline In/Out Bed: Needs assistance Is this a change from baseline?: Pre-admission baseline Walks in Home: Independent with device (comment) Does the patient have difficulty walking or climbing stairs?: Yes Weakness of Legs: Both Weakness of Arms/Hands: Both  Permission Sought/Granted                  Emotional Assessment Appearance:: Developmentally appropriate Attitude/Demeanor/Rapport:  (not able to speak with patient)     Alcohol / Substance Use: Not Applicable Psych Involvement: No (comment)  Admission diagnosis:  Femur fracture ([place]) [S72.90XA] Closed displaced intertrochanteric fracture of right femur, initial encounter ([place]) [S72.141A] Patient Active Problem List   Diagnosis Date Noted   Femur fracture ([place]) 08/15/2022   Hypokalemia 07/08/2022   Leukocytosis 07/08/2022   Chronic diastolic CHF (congestive heart failure) ([place]) 07/08/2022   Acute encephalopathy 17/49/4496   Acute metabolic encephalopathy 75/91/6384   Confusion with nonfocal neurological  examination 06/20/2022   Dementia with behavioral disturbance ([place]) 06/20/2022   Lymphocytic colitis 10/13/2019   Benign prostatic hyperplasia with urinary obstruction 08/20/2019   Nocturia 08/20/2019   Pancreatitis 06/13/2016   Type 2 diabetes mellitus with complication (HCC)    S/P drug eluting coronary stent placement    Hypertension    Hyperlipidemia    History of colonic polyps    Diverticulosis of colon without  hemorrhage    Hemorrhoids 10/23/2014   Constipation 06/03/2012   Balance disorder/Fall at home 05/29/2012   PCP:  Asencion Noble, MD Pharmacy:   [facility], [address] [place] [address] Phone: [phone] Fax: [phone]     Social Determinants of Health (SDOH) Social History: [place]: No Food Insecurity (08/16/2022)  Housing: Low Risk  (08/16/2022)  Transportation Needs: No Transportation Needs (08/16/2022)  Utilities: Not At Risk (08/16/2022)  Tobacco Use: Medium Risk (08/16/2022)   SDOH Interventions:     Readmission Risk Interventions    07/18/2022    2:18 PM 07/14/2022   11:46 AM 07/11/2022   12:11 PM  Readmission Risk Prevention Plan  Transportation Screening  Complete Complete  PCP or Specialist Appt within 3-5 Days  Complete Complete  HRI or [place]  Complete Complete  Social Work Consult for [place] Planning/Counseling  Complete Complete  Palliative Care Screening  Not Applicable Not Applicable  Medication Review Press photographer)  Complete Complete  PCP or Specialist appointment within 3-5 days of discharge Complete    HRI or [place] Complete    SW Recovery Care/Counseling Consult Complete    [place] Not Applicable

## 2022-08-16 NOTE — Anesthesia Procedure Notes (Signed)
Procedure Name: Intubation Date/Time: 08/16/2022 8:56 PM  Performed by: Esaw Grandchild, CRNAPre-anesthesia Checklist: Patient identified, Emergency Drugs available, Suction available and Patient being monitored Patient Re-evaluated:Patient Re-evaluated prior to induction Oxygen Delivery Method: Circle system utilized Preoxygenation: Pre-oxygenation with 100% oxygen Induction Type: IV induction Laryngoscope Size: McGraph and 3 Grade View: Grade I Tube type: Oral Tube size: 7.5 mm Number of attempts: 1 Airway Equipment and Method: Stylet, Oral airway and Bite block Placement Confirmation: ETT inserted through vocal cords under direct vision, positive ETCO2 and breath sounds checked- equal and bilateral Secured at: 23 cm Tube secured with: Tape Dental Injury: Teeth and Oropharynx as per pre-operative assessment

## 2022-08-17 ENCOUNTER — Encounter: Payer: Self-pay | Admitting: Orthopedic Surgery

## 2022-08-17 DIAGNOSIS — S72144A Nondisplaced intertrochanteric fracture of right femur, initial encounter for closed fracture: Secondary | ICD-10-CM | POA: Diagnosis not present

## 2022-08-17 LAB — CBC
HCT: 29.2 % — ABNORMAL LOW (ref 39.0–52.0)
Hemoglobin: 9.5 g/dL — ABNORMAL LOW (ref 13.0–17.0)
MCH: 28.6 pg (ref 26.0–34.0)
MCHC: 32.5 g/dL (ref 30.0–36.0)
MCV: 88 fL (ref 80.0–100.0)
Platelets: 247 10*3/uL (ref 150–400)
RBC: 3.32 MIL/uL — ABNORMAL LOW (ref 4.22–5.81)
RDW: 13.9 % (ref 11.5–15.5)
WBC: 6.9 10*3/uL (ref 4.0–10.5)
nRBC: 0 % (ref 0.0–0.2)

## 2022-08-17 LAB — GLUCOSE, CAPILLARY
Glucose-Capillary: 125 mg/dL — ABNORMAL HIGH (ref 70–99)
Glucose-Capillary: 166 mg/dL — ABNORMAL HIGH (ref 70–99)
Glucose-Capillary: 188 mg/dL — ABNORMAL HIGH (ref 70–99)
Glucose-Capillary: 174 mg/dL — ABNORMAL HIGH (ref 70–99)
Glucose-Capillary: 112 mg/dL — ABNORMAL HIGH (ref 70–99)

## 2022-08-17 NOTE — Progress Notes (Signed)
Subjective:  Patient reports pain as mild.    Objective:   VITALS:   Vitals:   08/16/22 2230 08/16/22 2231 08/16/22 2259 08/17/22 0146  BP: (!) 171/65 (!) 163/52 (!) 139/49 (!) 126/55  Pulse: 66 77 70 65  Resp: 14 13 17 16   Temp:  98.7 F (37.1 C) 98.1 F (36.7 C) (!) 97.5 F (36.4 C)  TempSrc:      SpO2: 99% 99% 97% 99%  Weight:      Height:        PHYSICAL EXAM:  Neurologically intact ABD soft Neurovascular intact Sensation intact distally Intact pulses distally Dorsiflexion/Plantar flexion intact Incision: dressing C/D/I No cellulitis present Compartment soft  LABS  Results for orders placed or performed during the hospital encounter of 08/15/22 (from the past 24 hour(s))  Glucose, capillary     Status: Abnormal   Collection Time: 08/16/22  8:51 AM  Result Value Ref Range   Glucose-Capillary 143 (H) 70 - 99 mg/dL  Glucose, capillary     Status: None   Collection Time: 08/16/22 12:39 PM  Result Value Ref Range   Glucose-Capillary 94 70 - 99 mg/dL  Surgical PCR screen     Status: None   Collection Time: 08/16/22  3:20 PM   Specimen: Nasal Mucosa; Nasal Swab  Result Value Ref Range   MRSA, PCR NEGATIVE NEGATIVE   Staphylococcus aureus NEGATIVE NEGATIVE  Glucose, capillary     Status: Abnormal   Collection Time: 08/16/22 10:08 PM  Result Value Ref Range   Glucose-Capillary 65 (L) 70 - 99 mg/dL  Glucose, capillary     Status: Abnormal   Collection Time: 08/16/22 10:34 PM  Result Value Ref Range   Glucose-Capillary 165 (H) 70 - 99 mg/dL  Glucose, capillary     Status: Abnormal   Collection Time: 08/17/22  6:53 AM  Result Value Ref Range   Glucose-Capillary 125 (H) 70 - 99 mg/dL    DG HIP UNILAT WITH PELVIS 2-3 VIEWS RIGHT  Result Date: 08/16/2022 CLINICAL DATA:  ORIF right hip EXAM: DG HIP (WITH OR WITHOUT PELVIS) 2-3V RIGHT; DG C-ARM 1-60 MIN-NO REPORT COMPARISON:  Right hip radiographs dated 08/15/2022 Fluoroscopy time: 39 seconds FINDINGS:  Intraoperative fluoroscopic images during IM nail with dynamic hip screw fixation of an intertrochanteric right hip fracture. Fracture fragments are in near anatomic alignment and position, noting a minimally displaced lesser trochanteric fragment. IMPRESSION: Intraoperative fluoroscopic images during ORIF of an intertrochanteric right hip fracture, as above. Electronically Signed   By: Julian Hy M.D.   On: 08/16/2022 21:48   DG C-Arm 1-60 Min-No Report  Result Date: 08/16/2022 Fluoroscopy was utilized by the requesting physician.  No radiographic interpretation.   DG Chest 1 View  Result Date: 08/15/2022 CLINICAL DATA:  Fall. EXAM: CHEST  1 VIEW COMPARISON:  07/08/2022 FINDINGS: Low lung volumes.The cardiomediastinal contours are normal. The lungs are clear. Pulmonary vasculature is normal. No consolidation, pleural effusion, or pneumothorax. No acute osseous abnormalities are seen. IMPRESSION: Low lung volumes without acute abnormality. Electronically Signed   By: Keith Rake M.D.   On: 08/15/2022 20:46   DG Hip Unilat W or Wo Pelvis 2-3 Views Right  Result Date: 08/15/2022 CLINICAL DATA:  Pain after fall EXAM: DG HIP (WITH OR WITHOUT PELVIS) 2 V RIGHT COMPARISON:  None Available. FINDINGS: Mildly displaced intertrochanteric hip fracture on the right. Comminuted. No additional fracture or dislocation. Preserved joint spaces. Only mild degenerative changes of the sacroiliac joints. Hyperostosis. Osteopenia. Scattered vascular calcifications.  Degenerative changes seen of the lower lumbar spine at the edge of the imaging field. IMPRESSION: Mildly displaced intertrochanteric comminuted right hip fracture Electronically Signed   By: Jill Side M.D.   On: 08/15/2022 20:45    Assessment/Plan: 1 Day Post-Op   Principal Problem:   Femur fracture (HCC) Active Problems:   Type 2 diabetes mellitus with complication (HCC)   Hypertension   Dementia with behavioral disturbance (HCC)    Chronic diastolic CHF (congestive heart failure) ([place])   Malnutrition of moderate degree   Up with therapy Hydrocodone prn for pain WBAT Discharge per medicine Follow up in office in 2 weeks ( FEB 13 or 14 with Carlynn Spry PA-C  ) call to confirm appointment [place] [phone]    Carlynn Spry , PA-C 08/17/2022, 6:55 AM

## 2022-08-17 NOTE — Evaluation (Signed)
Physical Therapy Evaluation Patient Details Name: Jordan [name] MRN: [id number] DOB: Mar 28, 1923 Today's Date: 08/17/2022  History of Present Illness  89 y.o. male with medical history significant of dementia, CAD s/p DES to RCA (2009), HFpEF, hypertension, hyperlipidemia, type 2 diabetes, severe aortic stenosis, lymphocytic colitis, BPH who presented to the ED after a fall.  Found to have R hip fx with subsequent IM nailing 08/16/22.  Clinical Impression  Attempted to see pt multiple times this AM, consistently very sleepy and/or getting meds with nursing.  Did ultimately get to him but pt very confused and functionally limited.  He was able to state his name and birthday but did not know the date, location or recall that he had broken his hip and had surgery.  He made some effort with supine exercises but had pain hesitancy with R LE movements and needed constant cuing, assist and reinforcement.  Unsure of his recent baseline level of function but he is very limited at this time and will require continued PT here and at STR to address functional limitations.       Recommendations for follow up therapy are one component of a multi-disciplinary discharge planning process, led by the attending physician.  Recommendations may be updated based on patient status, additional functional criteria and insurance authorization.  Follow Up Recommendations Skilled nursing-short term rehab (<3 hours/day) Can patient physically be transported by private vehicle: No    Assistance Recommended at Discharge Frequent or constant Supervision/Assistance  Patient can return home with the following  Help with stairs or ramp for entrance;Assistance with cooking/housework;Two people to help with bathing/dressing/bathroom;Two people to help with walking and/or transfers;Assist for transportation    Equipment Recommendations None recommended by PT  Recommendations for Other Services       Functional Status  Assessment Patient has had a recent decline in their functional status and demonstrates the ability to make significant improvements in function in a reasonable and predictable amount of time.     Precautions / Restrictions Precautions Precautions: Fall Restrictions RLE Weight Bearing: Weight bearing as tolerated      Mobility  Bed Mobility Overal bed mobility: Needs Assistance Bed Mobility: Supine to Sit, Sit to Supine     Supine to sit: Max assist Sit to supine: Max assist   General bed mobility comments: Pt unable to intiaite movement despite much multi-modal cuing.  Could not maintain sitting EOB once assisted up, strong retro lean with inability to bend knees/get feet on the floor.    Transfers                   General transfer comment: unable to safely attempt this date    Ambulation/Gait                  Stairs            Wheelchair Mobility    Modified Rankin (Stroke Patients Only)       Balance Overall balance assessment: Needs assistance Sitting-balance support: Feet unsupported, Bilateral upper extremity supported Sitting balance-Leahy Scale: Zero Sitting balance - Comments: strong reto lean, knees staying in near extension and unable to relax to get feet to the floor.  Poor tolerance, clearly confused.                                     Pertinent Vitals/Pain Pain Assessment Pain Assessment: Faces Faces Pain Scale: Hurts even  more Pain Location: movement with R hip, causes increased hesitation/minimal calling out    Home Living Family/patient expects to be discharged to:: Skilled nursing facility                   Additional Comments: Pt unable to answer, most recent notes indicate he had been living with family at home but this was getting more difficult.    Prior Function Prior Level of Function : Needs assist;Patient poor historian/Family not available;History of Falls (last six months)              Mobility Comments: apparently he is able to do some walking, he could not respond to PLOF questions ADLs Comments: unable to answer prior notes indicate near independent but also seem to indicate he has needed more assist recently     Hand Dominance        Extremity/Trunk Assessment   Upper Extremity Assessment Upper Extremity Assessment: Difficult to assess due to impaired cognition;Generalized weakness    Lower Extremity Assessment Lower Extremity Assessment: Generalized weakness;Difficult to assess due to impaired cognition (some AAROM on R, but generally unable to initiate movement)       Communication   Communication: HOH  Cognition Arousal/Alertness: Lethargic Behavior During Therapy: Flat affect Overall Cognitive Status: History of cognitive impairments - at baseline (unsure of baseline, only able to answer name and birthday - otherwise only occaisonally stating random numbers/words with no seeming relevance to the situation)                                          General Comments      Exercises General Exercises - Lower Extremity Ankle Circles/Pumps: AAROM, AROM, 10 reps Quad Sets: AROM, 10 reps Short Arc Quad: AAROM, 10 reps Heel Slides: AAROM, 5 reps (poor tolerance, c/o pain with minimal hip flexion) Hip ABduction/ADduction: AAROM, 10 reps (R pain limited)   Assessment/Plan    PT Assessment Patient needs continued PT services  PT Problem List Decreased strength;Decreased range of motion;Decreased activity tolerance;Decreased balance;Decreased mobility;Decreased coordination;Decreased cognition;Decreased knowledge of use of DME;Decreased safety awareness;Decreased knowledge of precautions;Decreased skin integrity       PT Treatment Interventions DME instruction;Gait training;Stair training;Functional mobility training;Therapeutic activities;Therapeutic exercise;Balance training;Neuromuscular re-education;Patient/family education;Cognitive  remediation    PT Goals (Current goals can be found in the Care Plan section)  Acute Rehab PT Goals Patient Stated Goal: unable to state PT Goal Formulation: With patient Time For Goal Achievement: 08/30/22 Potential to Achieve Goals: Fair    Frequency 7X/week     Co-evaluation               AM-PAC PT "6 Clicks" Mobility  Outcome Measure Help needed turning from your back to your side while in a flat bed without using bedrails?: A Lot Help needed moving from lying on your back to sitting on the side of a flat bed without using bedrails?: Total Help needed moving to and from a bed to a chair (including a wheelchair)?: Total Help needed standing up from a chair using your arms (e.g., wheelchair or bedside chair)?: Total Help needed to walk in hospital room?: Total Help needed climbing 3-5 steps with a railing? : Total 6 Click Score: 7    End of Session   Activity Tolerance: Patient limited by pain;Patient limited by lethargy Patient left: with bed alarm set;with call bell/phone within reach Nurse  Communication: Mobility status PT Visit Diagnosis: Unsteadiness on feet (R26.81);Other abnormalities of gait and mobility (R26.89);Muscle weakness (generalized) (M62.81)    Time: 3491-7915 PT Time Calculation (min) (ACUTE ONLY): 21 min   Charges:   PT Evaluation $PT Eval Low Complexity: 1 Low PT Treatments $Therapeutic Exercise: 8-22 mins        Kreg Shropshire, DPT 08/17/2022, 2:39 PM

## 2022-08-17 NOTE — Plan of Care (Signed)
Patient asleep most of the night. Oriented to self only. Denies pain. Surgical dressing C/D/I. Plan of care reviewed with family. Bed alarm on.   Problem: Education: Goal: Ability to describe self-care measures that may prevent or decrease complications (Diabetes Survival Skills Education) will improve Outcome: Progressing Goal: Individualized Educational Video(s) Outcome: Progressing   Problem: Coping: Goal: Ability to adjust to condition or change in health will improve Outcome: Progressing   Problem: Fluid Volume: Goal: Ability to maintain a balanced intake and output will improve Outcome: Progressing   Problem: Health Behavior/Discharge Planning: Goal: Ability to identify and utilize available resources and services will improve Outcome: Progressing Goal: Ability to manage health-related needs will improve Outcome: Progressing   Problem: Metabolic: Goal: Ability to maintain appropriate glucose levels will improve Outcome: Progressing   Problem: Nutritional: Goal: Maintenance of adequate nutrition will improve Outcome: Progressing Goal: Progress toward achieving an optimal weight will improve Outcome: Progressing   Problem: Skin Integrity: Goal: Risk for impaired skin integrity will decrease Outcome: Progressing   Problem: Tissue Perfusion: Goal: Adequacy of tissue perfusion will improve Outcome: Progressing   Problem: Education: Goal: Knowledge of General Education information will improve Description: Including pain rating scale, medication(s)/side effects and non-pharmacologic comfort measures Outcome: Progressing   Problem: Health Behavior/Discharge Planning: Goal: Ability to manage health-related needs will improve Outcome: Progressing   Problem: Clinical Measurements: Goal: Ability to maintain clinical measurements within normal limits will improve Outcome: Progressing Goal: Will remain free from infection Outcome: Progressing Goal: Diagnostic test  results will improve Outcome: Progressing Goal: Respiratory complications will improve Outcome: Progressing Goal: Cardiovascular complication will be avoided Outcome: Progressing   Problem: Activity: Goal: Risk for activity intolerance will decrease Outcome: Progressing   Problem: Nutrition: Goal: Adequate nutrition will be maintained Outcome: Progressing   Problem: Coping: Goal: Level of anxiety will decrease Outcome: Progressing   Problem: Elimination: Goal: Will not experience complications related to bowel motility Outcome: Progressing Goal: Will not experience complications related to urinary retention Outcome: Progressing   Problem: Pain Managment: Goal: General experience of comfort will improve Outcome: Progressing   Problem: Safety: Goal: Ability to remain free from injury will improve Outcome: Progressing   Problem: Skin Integrity: Goal: Risk for impaired skin integrity will decrease Outcome: Progressing

## 2022-08-17 NOTE — Progress Notes (Signed)
Progress Note    Jordan [name]  YOV:[id number] DOB: 05/18/1923  DOA: 08/15/2022 PCP: Asencion Noble, MD      Brief Narrative:    Medical records reviewed and are as summarized below:  Jordan [name] is a 89 y.o. male with medical history significant of dementia, CAD s/p DES to RCA (2009), HFpEF, hypertension, hyperlipidemia, type 2 diabetes, severe aortic stenosis, lymphocytic colitis, BPH, who presented from [place] home to the hospital after a fall.  Workup revealed right hip fracture.    Assessment/Plan:   Principal Problem:   Femur fracture (HCC) Active Problems:   Dementia with behavioral disturbance (HCC)   Chronic diastolic CHF (congestive heart failure) (HCC)   Type 2 diabetes mellitus with complication (HCC)   Hypertension   Malnutrition of moderate degree   Body mass index is 17.91 kg/m.   Mildly displaced intertrochanteric comminuted right hip fracture, s/p fall: S/p right intramedullary nail intertrochanteric on 08/06/2022.  Analgesics as needed for pain.  Follow-up with orthopedic surgeon.     Type II DM with mild hyperglycemia; hypoglycemia on 08/17/2022.  Glucose level was 65 at 10 PM on 08/17/2022.  Change diet to carb modified diet.  Will use NovoLog as needed if he remains hyperglycemic. Hemoglobin A1c was 7.2 on 06/22/2022.  Dementia with behavioral disturbance: Continue memantine and Seroquel.   Chronic diastolic CHF, severe aortic stenosis: Compensated.  2D echo in October 2023 showed EF estimated at 60 to 65%, moderate LVH, grade 1 diastolic dysfunction, moderate aortic regurgitation, moderate to severe aortic stenosis   Type II DM: Metformin on hold.  NovoLog as needed for hyperglycemia.   Other comorbidities include hypertension on metoprolol, lymphocytic colitis on budesonide     Diet Order             Diet regular Room service appropriate? Yes; Fluid consistency: Thin  Diet effective now                             Consultants: Orthopedic surgeon  Procedures: Right intramedullary nail intertrochanteric    Medications:    budesonide  3 mg Oral q AM   docusate sodium  100 mg Oral BID   feeding supplement  237 mL Oral TID BM   finasteride  5 mg Oral Daily   insulin aspart  0-9 Units Subcutaneous TID WC   memantine  10 mg Oral BID   metoprolol tartrate  12.5 mg Oral BID   mirabegron ER  25 mg Oral q AM   multivitamin with minerals  1 tablet Oral Daily   mupirocin ointment  1 Application Nasal BID   pravastatin  80 mg Oral q1800   QUEtiapine  75 mg Oral QHS   Continuous Infusions:   ceFAZolin (ANCEF) IV     methocarbamol (ROBAXIN) IV     tranexamic acid       Anti-infectives (From admission, onward)    Start     Dose/Rate Route Frequency Ordered Stop   08/17/22 0300  ceFAZolin (ANCEF) IVPB 2g/100 mL premix        2 g 200 mL/hr over 30 Minutes Intravenous Every 8 hours 08/16/22 2301 08/17/22 1121   08/16/22 1748  ceFAZolin (ANCEF) IVPB 2g/100 mL premix        2 g 200 mL/hr over 30 Minutes Intravenous 30 min pre-op 08/16/22 1749  Family Communication/Anticipated D/C date and plan/Code Status   DVT prophylaxis: SCDs Start: 08/16/22 2302     Code Status: DNR  Family Communication: None Disposition Plan: Plan to discharge to SNF in 2 to 4 days days   Status is: Inpatient Remains inpatient appropriate because: S/p right hip surgery       Subjective:   Interval events noted.  He has no complaints. Ernestene Mention, RN and student nurse were at the bedside.  Objective:    Vitals:   08/16/22 2259 08/17/22 0146 08/17/22 0745 08/17/22 1211  BP: (!) 139/49 (!) 126/55 (!) 128/90 (!) 137/58  Pulse: 70 65 60 63  Resp: 17 16 16 16   Temp: 98.1 F (36.7 C) (!) 97.5 F (36.4 C) 97.7 F (36.5 C) 97.7 F (36.5 C)  TempSrc:      SpO2: 97% 99% 100% 100%  Weight:      Height:       No data found.   Intake/Output Summary (Last 24  hours) at 08/17/2022 1428 Last data filed at 08/17/2022 1230 Gross per 24 hour  Intake 1655 ml  Output 400 ml  Net 1255 ml   Filed Weights   08/15/22 2007  Weight: 55 kg    Exam:   GEN: NAD SKIN: Bruises on bilateral knees, skin tears on the left shoulder, right elbow and left elbow EYES: No pallor or icterus ENT: MMM CV: RRR PULM: CTA B ABD: soft, ND, NT, +BS CNS: AAO x 1 (person), non focal EXT: Mild swelling on the right hip.  Dressing on right hip surgical incisional wounds are clean, dry and intact      Data Reviewed:   I have personally reviewed following labs and imaging studies:  Labs: Labs show the following:   Basic Metabolic Panel: Recent Labs  Lab 08/15/22 2010 08/16/22 0602  NA 138 136  K 3.8 3.8  CL 103 106  CO2 25 27  GLUCOSE 211* 154*  BUN 28* 27*  CREATININE 1.06 0.94  CALCIUM 8.2* 7.9*   GFR Estimated Creatinine Clearance: 34.1 mL/min (by C-G formula based on SCr of 0.94 mg/dL). Liver Function Tests: No results for input(s): "AST", "ALT", "ALKPHOS", "BILITOT", "PROT", "ALBUMIN" in the last 168 hours. No results for input(s): "LIPASE", "AMYLASE" in the last 168 hours. No results for input(s): "AMMONIA" in the last 168 hours. Coagulation profile No results for input(s): "INR", "PROTIME" in the last 168 hours.  CBC: Recent Labs  Lab 08/15/22 2010 08/16/22 0602 08/17/22 0818  WBC 6.0 7.2 6.9  NEUTROABS  --  5.6  --   HGB 10.2* 9.8* 9.5*  HCT 33.0* 30.5* 29.2*  MCV 92.7 89.2 88.0  PLT 297 261 247   Cardiac Enzymes: No results for input(s): "CKTOTAL", "CKMB", "CKMBINDEX", "TROPONINI" in the last 168 hours. BNP (last 3 results) No results for input(s): "PROBNP" in the last 8760 hours. CBG: Recent Labs  Lab 08/16/22 2208 08/16/22 2234 08/17/22 0653 08/17/22 0742 08/17/22 1150  GLUCAP 65* 165* 125* 166* 188*   D-Dimer: No results for input(s): "DDIMER" in the last 72 hours. Hgb A1c: No results for input(s): "HGBA1C" in the  last 72 hours. Lipid Profile: No results for input(s): "CHOL", "HDL", "LDLCALC", "TRIG", "CHOLHDL", "LDLDIRECT" in the last 72 hours. Thyroid function studies: No results for input(s): "TSH", "T4TOTAL", "T3FREE", "THYROIDAB" in the last 72 hours.  Invalid input(s): "FREET3" Anemia work up: No results for input(s): "VITAMINB12", "FOLATE", "FERRITIN", "TIBC", "IRON", "RETICCTPCT" in the last 72 hours. Sepsis Labs: Recent Labs  Lab 08/15/22 2010 08/16/22 0602 08/17/22 0818  WBC 6.0 7.2 6.9    Microbiology Recent Results (from the past 240 hour(s))  Surgical PCR screen     Status: None   Collection Time: 08/16/22  3:20 PM   Specimen: Nasal Mucosa; Nasal Swab  Result Value Ref Range Status   MRSA, PCR NEGATIVE NEGATIVE Final   Staphylococcus aureus NEGATIVE NEGATIVE Final    Comment: (NOTE) The Xpert SA Assay (FDA approved for NASAL specimens in patients 73 years of age and older), is one component of a comprehensive surveillance program. It is not intended to diagnose infection nor to guide or monitor treatment. Performed at [facility], [address] [place], [place] [address]     Procedures and diagnostic studies:  DG C-Arm 1-60 Min-No Report  Result Date: 08/16/2022 CLINICAL DATA:  ORIF right hip EXAM: DG HIP (WITH OR WITHOUT PELVIS) 2-3V RIGHT; DG C-ARM 1-60 MIN-NO REPORT COMPARISON:  Right hip radiographs dated 08/15/2022 Fluoroscopy time: 39 seconds FINDINGS: Intraoperative fluoroscopic images during IM nail with dynamic hip screw fixation of an intertrochanteric right hip fracture. Fracture fragments are in near anatomic alignment and position, noting a minimally displaced lesser trochanteric fragment. IMPRESSION: Intraoperative fluoroscopic images during ORIF of an intertrochanteric right hip fracture, as above. Electronically Signed   By: Julian Hy M.D.   On: 08/16/2022 21:48   DG HIP UNILAT WITH PELVIS 2-3 VIEWS RIGHT  Result Date:  08/16/2022 CLINICAL DATA:  ORIF right hip EXAM: DG HIP (WITH OR WITHOUT PELVIS) 2-3V RIGHT; DG C-ARM 1-60 MIN-NO REPORT COMPARISON:  Right hip radiographs dated 08/15/2022 Fluoroscopy time: 39 seconds FINDINGS: Intraoperative fluoroscopic images during IM nail with dynamic hip screw fixation of an intertrochanteric right hip fracture. Fracture fragments are in near anatomic alignment and position, noting a minimally displaced lesser trochanteric fragment. IMPRESSION: Intraoperative fluoroscopic images during ORIF of an intertrochanteric right hip fracture, as above. Electronically Signed   By: Julian Hy M.D.   On: 08/16/2022 21:48   DG Chest 1 View  Result Date: 08/15/2022 CLINICAL DATA:  Fall. EXAM: CHEST  1 VIEW COMPARISON:  07/08/2022 FINDINGS: Low lung volumes.The cardiomediastinal contours are normal. The lungs are clear. Pulmonary vasculature is normal. No consolidation, pleural effusion, or pneumothorax. No acute osseous abnormalities are seen. IMPRESSION: Low lung volumes without acute abnormality. Electronically Signed   By: Keith Rake M.D.   On: 08/15/2022 20:46   DG Hip Unilat W or Wo Pelvis 2-3 Views Right  Result Date: 08/15/2022 CLINICAL DATA:  Pain after fall EXAM: DG HIP (WITH OR WITHOUT PELVIS) 2 V RIGHT COMPARISON:  None Available. FINDINGS: Mildly displaced intertrochanteric hip fracture on the right. Comminuted. No additional fracture or dislocation. Preserved joint spaces. Only mild degenerative changes of the sacroiliac joints. Hyperostosis. Osteopenia. Scattered vascular calcifications. Degenerative changes seen of the lower lumbar spine at the edge of the imaging field. IMPRESSION: Mildly displaced intertrochanteric comminuted right hip fracture Electronically Signed   By: Jill Side M.D.   On: 08/15/2022 20:45               LOS: 2 days   Paislynn Hegstrom  [facility]   Pager on [email or web address]. If 7PM-7AM, please contact night-coverage at  [email or web address]     08/17/2022, 2:28 PM

## 2022-08-17 NOTE — TOC Progression Note (Signed)
Transition of Care [facility]) - Progression Note    Patient Details  Name: Jordan [name] MRN: [id number] Date of Birth: [birth year]  Transition of [facility]) CM/SW [place], LCSW Phone Number: 08/17/2022, 4:13 PM  Clinical Narrative:    CSW spoke with pts dtr, [name] to discuss SNF recommendation.  She is amenable.  She wants pt to go to [facility].  CSW spoke with [facility] and sent referral through [facility].  Kerrie not sure when a bed will be available, CSW will send additional referrals to local SNF's.   Expected Discharge Plan:  (unknown as of right now) Barriers to Discharge: Continued Medical Work up  Expected Discharge Plan and Services       Living arrangements for the past 2 months: [place] Determinants of Health (SDOH) Interventions [place]: No Food Insecurity (08/16/2022)  Housing: Low Risk  (08/16/2022)  Transportation Needs: No Transportation Needs (08/16/2022)  Utilities: Not At Risk (08/16/2022)  Tobacco Use: Medium Risk (08/17/2022)    Readmission Risk Interventions    07/18/2022    2:18 PM 07/14/2022   11:46 AM 07/11/2022   12:11 PM  Readmission Risk Prevention Plan  Transportation Screening  Complete Complete  PCP or Specialist Appt within 3-5 Days  Complete Complete  HRI or [place]  Complete Complete  Social Work Consult for [place] Planning/Counseling  Complete Complete  Palliative Care Screening  Not Applicable Not Applicable  Medication Review Press photographer)  Complete Complete  PCP or Specialist appointment within 3-5 days of discharge Complete    HRI or [place] Complete    SW Recovery Care/Counseling Consult Complete    [place] Not Applicable

## 2022-08-18 DIAGNOSIS — I5032 Chronic diastolic (congestive) heart failure: Secondary | ICD-10-CM | POA: Diagnosis not present

## 2022-08-18 DIAGNOSIS — S72144A Nondisplaced intertrochanteric fracture of right femur, initial encounter for closed fracture: Secondary | ICD-10-CM | POA: Diagnosis not present

## 2022-08-18 LAB — CBC WITH DIFFERENTIAL/PLATELET
Abs Immature Granulocytes: 0.04 10*3/uL (ref 0.00–0.07)
Basophils Absolute: 0 10*3/uL (ref 0.0–0.1)
Basophils Relative: 0 %
Eosinophils Absolute: 0 10*3/uL (ref 0.0–0.5)
Eosinophils Relative: 0 %
HCT: 27.3 % — ABNORMAL LOW (ref 39.0–52.0)
Hemoglobin: 8.8 g/dL — ABNORMAL LOW (ref 13.0–17.0)
Immature Granulocytes: 1 %
Lymphocytes Relative: 13 %
Lymphs Abs: 1.2 10*3/uL (ref 0.7–4.0)
MCH: 29.1 pg (ref 26.0–34.0)
MCHC: 32.2 g/dL (ref 30.0–36.0)
MCV: 90.4 fL (ref 80.0–100.0)
Monocytes Absolute: 1 10*3/uL (ref 0.1–1.0)
Monocytes Relative: 11 %
Neutro Abs: 6.6 10*3/uL (ref 1.7–7.7)
Neutrophils Relative %: 75 %
Platelets: 237 10*3/uL (ref 150–400)
RBC: 3.02 MIL/uL — ABNORMAL LOW (ref 4.22–5.81)
RDW: 13.9 % (ref 11.5–15.5)
WBC: 8.8 10*3/uL (ref 4.0–10.5)
nRBC: 0 % (ref 0.0–0.2)

## 2022-08-18 LAB — GLUCOSE, CAPILLARY
Glucose-Capillary: 100 mg/dL — ABNORMAL HIGH (ref 70–99)
Glucose-Capillary: 143 mg/dL — ABNORMAL HIGH (ref 70–99)
Glucose-Capillary: 142 mg/dL — ABNORMAL HIGH (ref 70–99)
Glucose-Capillary: 148 mg/dL — ABNORMAL HIGH (ref 70–99)

## 2022-08-18 NOTE — Progress Notes (Signed)
Progress Note    Jordan [name]  LHT:[id number] DOB: 01-Mar-1923  DOA: 08/15/2022 PCP: Asencion Noble, MD      Brief Narrative:    Medical records reviewed and are as summarized below:  Jordan [name] is a 89 y.o. male with medical history significant of dementia, CAD s/p DES to RCA (2009), HFpEF, hypertension, hyperlipidemia, type 2 diabetes, severe aortic stenosis, lymphocytic colitis, BPH, who presented from [place] home to the hospital after a fall.  Workup revealed right hip fracture.    Assessment/Plan:   Principal Problem:   Femur fracture (HCC) Active Problems:   Dementia with behavioral disturbance (HCC)   Chronic diastolic CHF (congestive heart failure) (HCC)   Type 2 diabetes mellitus with complication (HCC)   Hypertension   Malnutrition of moderate degree   Body mass index is 17.91 kg/m.   Mildly displaced intertrochanteric comminuted right hip fracture, s/p fall: S/p right intramedullary nail intertrochanteric on 08/06/2022.  Continue analgesics as needed for pain.  Outpatient follow-up with orthopedic surgeon.   Type II DM with mild hyperglycemia; hypoglycemia on 08/16/2022.  Glucose level was 65 at 10 PM on 08/16/2022.  Glucose levels are stable.  Monitor closely. Hemoglobin A1c was 7.2 on 06/22/2022.  Dementia with behavioral disturbance: Continue memantine and Seroquel.   Chronic diastolic CHF, severe aortic stenosis: Compensated.  2D echo in October 2023 showed EF estimated at 60 to 65%, moderate LVH, grade 1 diastolic dysfunction, moderate aortic regurgitation, moderate to severe aortic stenosis   Type II DM: Metformin on hold.  Discontinue NovoLog for now.   Other comorbidities include hypertension on metoprolol, lymphocytic colitis on budesonide     Diet Order             Diet Carb Modified Fluid consistency: Thin; Room service appropriate? Yes  Diet effective now                             Consultants: Orthopedic surgeon  Procedures: Right intramedullary nail intertrochanteric    Medications:    budesonide  3 mg Oral q AM   docusate sodium  100 mg Oral BID   feeding supplement  237 mL Oral TID BM   finasteride  5 mg Oral Daily   memantine  10 mg Oral BID   metoprolol tartrate  12.5 mg Oral BID   mirabegron ER  25 mg Oral q AM   multivitamin with minerals  1 tablet Oral Daily   mupirocin ointment  1 Application Nasal BID   pravastatin  80 mg Oral q1800   QUEtiapine  75 mg Oral QHS   Continuous Infusions:   ceFAZolin (ANCEF) IV     methocarbamol (ROBAXIN) IV       Anti-infectives (From admission, onward)    Start     Dose/Rate Route Frequency Ordered Stop   08/17/22 0300  ceFAZolin (ANCEF) IVPB 2g/100 mL premix        2 g 200 mL/hr over 30 Minutes Intravenous Every 8 hours 08/16/22 2301 08/17/22 1121   08/16/22 1748  ceFAZolin (ANCEF) IVPB 2g/100 mL premix        2 g 200 mL/hr over 30 Minutes Intravenous 30 min pre-op 08/16/22 1749                Family Communication/Anticipated D/C date and plan/Code Status   DVT prophylaxis: SCDs Start: 08/16/22 2302     Code Status: DNR  Family  Communication: Plan discussed with [name], daughter, wife Disposition Plan: Plan to discharge to SNF in 1 to 2 days   Status is: Inpatient Remains inpatient appropriate because: S/p right hip surgery       Subjective:   Interval events noted. No pain in the right hip  Objective:    Vitals:   08/17/22 1211 08/17/22 1541 08/17/22 2341 08/18/22 0824  BP: (!) 137/58 119/62 (!) 126/50 (!) 141/66  Pulse: 63 67 (!) 58 66  Resp: 16 16 16 16   Temp: 97.7 F (36.5 C) 98.2 F (36.8 C) 98.5 F (36.9 C) 98.2 F (36.8 C)  TempSrc:      SpO2: 100% 100% 100% 100%  Weight:      Height:       No data found.   Intake/Output Summary (Last 24 hours) at 08/18/2022 1328 Last data filed at 08/18/2022 1300 Gross per 24 hour  Intake 480 ml   Output 400 ml  Net 80 ml   Filed Weights   08/15/22 2007  Weight: 55 kg    Exam:   GEN: NAD SKIN: Warm and dry EYES: No pallor or icterus ENT: MMM CV: RRR PULM: CTA B ABD: soft, ND, NT, +BS CNS: AAO x person, non focal EXT: Mild right hip swelling and tenderness.  Dressing on right hip surgical incisional wound is clean, dry and intact      Data Reviewed:   I have personally reviewed following labs and imaging studies:  Labs: Labs show the following:   Basic Metabolic Panel: Recent Labs  Lab 08/15/22 2010 08/16/22 0602  NA 138 136  K 3.8 3.8  CL 103 106  CO2 25 27  GLUCOSE 211* 154*  BUN 28* 27*  CREATININE 1.06 0.94  CALCIUM 8.2* 7.9*   GFR Estimated Creatinine Clearance: 34.1 mL/min (by C-G formula based on SCr of 0.94 mg/dL). Liver Function Tests: No results for input(s): "AST", "ALT", "ALKPHOS", "BILITOT", "PROT", "ALBUMIN" in the last 168 hours. No results for input(s): "LIPASE", "AMYLASE" in the last 168 hours. No results for input(s): "AMMONIA" in the last 168 hours. Coagulation profile No results for input(s): "INR", "PROTIME" in the last 168 hours.  CBC: Recent Labs  Lab 08/15/22 2010 08/16/22 0602 08/17/22 0818 08/18/22 0231  WBC 6.0 7.2 6.9 8.8  NEUTROABS  --  5.6  --  6.6  HGB 10.2* 9.8* 9.5* 8.8*  HCT 33.0* 30.5* 29.2* 27.3*  MCV 92.7 89.2 88.0 90.4  PLT 297 261 247 237   Cardiac Enzymes: No results for input(s): "CKTOTAL", "CKMB", "CKMBINDEX", "TROPONINI" in the last 168 hours. BNP (last 3 results) No results for input(s): "PROBNP" in the last 8760 hours. CBG: Recent Labs  Lab 08/17/22 1150 08/17/22 1711 08/17/22 2056 08/18/22 0924 08/18/22 1153  GLUCAP 188* 174* 112* 100* 143*   D-Dimer: No results for input(s): "DDIMER" in the last 72 hours. Hgb A1c: No results for input(s): "HGBA1C" in the last 72 hours. Lipid Profile: No results for input(s): "CHOL", "HDL", "LDLCALC", "TRIG", "CHOLHDL", "LDLDIRECT" in the last  72 hours. Thyroid function studies: No results for input(s): "TSH", "T4TOTAL", "T3FREE", "THYROIDAB" in the last 72 hours.  Invalid input(s): "FREET3" Anemia work up: No results for input(s): "VITAMINB12", "FOLATE", "FERRITIN", "TIBC", "IRON", "RETICCTPCT" in the last 72 hours. Sepsis Labs: Recent Labs  Lab 08/15/22 2010 08/16/22 0602 08/17/22 0818 08/18/22 0231  WBC 6.0 7.2 6.9 8.8    Microbiology Recent Results (from the past 240 hour(s))  Surgical PCR screen  Status: None   Collection Time: 08/16/22  3:20 PM   Specimen: Nasal Mucosa; Nasal Swab  Result Value Ref Range Status   MRSA, PCR NEGATIVE NEGATIVE Final   Staphylococcus aureus NEGATIVE NEGATIVE Final    Comment: (NOTE) The Xpert SA Assay (FDA approved for NASAL specimens in patients 37 years of age and older), is one component of a comprehensive surveillance program. It is not intended to diagnose infection nor to guide or monitor treatment. Performed at [facility], [address] [place], [place] [id number]     Procedures and diagnostic studies:  DG C-Arm 1-60 Min-No Report  Result Date: 08/16/2022 CLINICAL DATA:  ORIF right hip EXAM: DG HIP (WITH OR WITHOUT PELVIS) 2-3V RIGHT; DG C-ARM 1-60 MIN-NO REPORT COMPARISON:  Right hip radiographs dated 08/15/2022 Fluoroscopy time: 39 seconds FINDINGS: Intraoperative fluoroscopic images during IM nail with dynamic hip screw fixation of an intertrochanteric right hip fracture. Fracture fragments are in near anatomic alignment and position, noting a minimally displaced lesser trochanteric fragment. IMPRESSION: Intraoperative fluoroscopic images during ORIF of an intertrochanteric right hip fracture, as above. Electronically Signed   By: Julian Hy M.D.   On: 08/16/2022 21:48   DG HIP UNILAT WITH PELVIS 2-3 VIEWS RIGHT  Result Date: 08/16/2022 CLINICAL DATA:  ORIF right hip EXAM: DG HIP (WITH OR WITHOUT PELVIS) 2-3V RIGHT; DG C-ARM 1-60 MIN-NO REPORT  COMPARISON:  Right hip radiographs dated 08/15/2022 Fluoroscopy time: 39 seconds FINDINGS: Intraoperative fluoroscopic images during IM nail with dynamic hip screw fixation of an intertrochanteric right hip fracture. Fracture fragments are in near anatomic alignment and position, noting a minimally displaced lesser trochanteric fragment. IMPRESSION: Intraoperative fluoroscopic images during ORIF of an intertrochanteric right hip fracture, as above. Electronically Signed   By: Julian Hy M.D.   On: 08/16/2022 21:48               LOS: 3 days   Mirtha Jain  [facility]   Pager on [email or web address]. If 7PM-7AM, please contact night-coverage at [email or web address]     08/18/2022, 1:28 PM

## 2022-08-18 NOTE — Progress Notes (Signed)
Physical Therapy Treatment Patient Details Name: Jordan [name] MRN: [id number] DOB: 1923-03-04 Today's Date: 08/18/2022   History of Present Illness 89 y.o. male with medical history significant of dementia, CAD s/p DES to RCA (2009), HFpEF, hypertension, hyperlipidemia, type 2 diabetes, severe aortic stenosis, lymphocytic colitis, BPH who presented to the ED after a fall.  Found to have R hip fx with subsequent IM nailing 08/16/22.    PT Comments    Pt received in bed agreeable to CO OT/PT interventions. Pt reported of 7/10 meds and nurse provided med during session. Pt pleasantly confused.  Pt demonstrated 3/5 MMT in RLE. Pt performed AROM to BLE and performed Bed mobility with Max assist. STS with Max 1 and need 2 person to maintain balance 2/2 to buckling the knee. Pt used FWW.  Hence, the static and dynamic balance is poor at this point. Pt performed step pivot with max assist second for safety. Pt tol tx well and continue with current POC     Recommendations for follow up therapy are one component of a multi-disciplinary discharge planning process, led by the attending physician.  Recommendations may be updated based on patient status, additional functional criteria and insurance authorization.  Follow Up Recommendations  Skilled nursing-short term rehab (<3 hours/day) Can patient physically be transported by private vehicle: No   Assistance Recommended at Discharge Frequent or constant Supervision/Assistance  Patient can return home with the following Two people to help with walking and/or transfers;A lot of help with bathing/dressing/bathroom;Assistance with cooking/housework;Assistance with feeding;Direct supervision/assist for medications management;Direct supervision/assist for financial management;Assist for transportation;Help with stairs or ramp for entrance   Equipment Recommendations  None recommended by PT    Recommendations for Other Services       Precautions /  Restrictions Precautions Precautions: Fall Restrictions Weight Bearing Restrictions: Yes RLE Weight Bearing: Weight bearing as tolerated     Mobility  Bed Mobility Overal bed mobility: Needs Assistance Bed Mobility: Supine to Sit     Supine to sit: Max assist     General bed mobility comments: Pt unable to intiaite movement despite much multi-modal cuing.  Could not maintain sitting EOB once assisted up, strong retro lean with inability to bend knees/get feet on the floor.    Transfers Overall transfer level: Needs assistance Equipment used: Rolling walker (2 wheels) Transfers: Sit to/from Stand, Bed to chair/wheelchair/BSC Sit to Stand: Mod assist, +2 physical assistance Stand pivot transfers: Max assist Step pivot transfers: Max assist       General transfer comment: pain increased with WB and R knee buckled with every step and while WB.    Ambulation/Gait               General Gait Details: unsafe today   Stairs             Wheelchair Mobility    Modified Rankin (Stroke Patients Only)       Balance Overall balance assessment: Needs assistance Sitting-balance support: Feet supported Sitting balance-Leahy Scale: Fair Sitting balance - Comments: mild lean backwards Postural control: Posterior lean   Standing balance-Leahy Scale: Poor Standing balance comment: using RW with knee buckling                            Cognition Arousal/Alertness: Awake/alert Behavior During Therapy: WFL for tasks assessed/performed Overall Cognitive Status: Impaired/Different from baseline  General Comments: orientd to self, and situation.        Exercises General Exercises - Lower Extremity Ankle Circles/Pumps: AROM, 10 reps Long Arc Quad: AROM, 10 reps, Seated, Both Hip Flexion/Marching: AROM, Both, 10 reps, Seated    General Comments        Pertinent Vitals/Pain Pain Assessment Pain  Assessment: 0-10 Pain Score: 7  Faces Pain Scale: Hurts whole lot Pain Location: movement with R hip, causes increased hesitation/minimal calling out    Home Living                          Prior Function            PT Goals (current goals can now be found in the care plan section) Acute Rehab PT Goals Patient Stated Goal: OK to go to SNF PT Goal Formulation: With patient Time For Goal Achievement: 08/30/22 Potential to Achieve Goals: Good Progress towards PT goals: Progressing toward goals    Frequency    7X/week      PT Plan Current plan remains appropriate    Co-evaluation PT/OT/SLP Co-Evaluation/Treatment: Yes   PT goals addressed during session: Balance;Mobility/safety with mobility        AM-PAC PT "6 Clicks" Mobility   Outcome Measure  Help needed turning from your back to your side while in a flat bed without using bedrails?: A Lot Help needed moving from lying on your back to sitting on the side of a flat bed without using bedrails?: A Lot Help needed moving to and from a bed to a chair (including a wheelchair)?: A Lot Help needed standing up from a chair using your arms (e.g., wheelchair or bedside chair)?: A Lot Help needed to walk in hospital room?: Total Help needed climbing 3-5 steps with a railing? : Total 6 Click Score: 10    End of Session   Activity Tolerance: Patient tolerated treatment well;Patient limited by pain Patient left: in chair;with call bell/phone within reach;with chair alarm set Nurse Communication: Mobility status PT Visit Diagnosis: Unsteadiness on feet (R26.81);Other abnormalities of gait and mobility (R26.89);Muscle weakness (generalized) (M62.81)     Time: 8185-9093 PT Time Calculation (min) (ACUTE ONLY): 25 min  Charges:  $Therapeutic Activity: 8-22 mins                    Louie Flenner PT DPT 11:29 AM,08/18/22  08/18/2022, 11:29 AM

## 2022-08-18 NOTE — Care Management Important Message (Signed)
Important Message  Patient Details  Name: Jordan [name] MRN: [id number] Date of Birth: [birth year]   Medicare Important Message Given:  Yes     Dannette Barbara 08/18/2022, 10:34 AM

## 2022-08-18 NOTE — NC FL2 (Signed)
[place] LEVEL OF CARE FORM     IDENTIFICATION  Patient Name: Jordan [name] Birthdate: April 04, 1923 Sex: male Admission Date (Current Location): 08/15/2022  [facility] and [place] Number:  Engineering geologist and Address:  [facility], [address] [place], [place] [address]      Provider Number: [id number]  Attending Physician Name and Address:  Jennye Boroughs, MD  Relative Name and Phone Number:  [name], daughter,  [phone]    Current Level of Care: Hospital Recommended Level of Care: [place] Prior Approval Number:    Date Approved/Denied:   PASRR Number: [id number]  Discharge Plan: SNF    Current Diagnoses: Patient Active Problem List   Diagnosis Date Noted   Malnutrition of moderate degree 08/16/2022   Femur fracture ([place]) 08/15/2022   Hypokalemia 07/08/2022   Leukocytosis 07/08/2022   Chronic diastolic CHF (congestive heart failure) ([place]) 07/08/2022   Acute encephalopathy 28/76/8115   Acute metabolic encephalopathy 72/62/0355   Confusion with nonfocal neurological examination 06/20/2022   Dementia with behavioral disturbance ([place]) 06/20/2022   Lymphocytic colitis 10/13/2019   Benign prostatic hyperplasia with urinary obstruction 08/20/2019   Nocturia 08/20/2019   Pancreatitis 06/13/2016   Type 2 diabetes mellitus with complication (HCC)    S/P drug eluting coronary stent placement    Hypertension    Hyperlipidemia    History of colonic polyps    Diverticulosis of colon without hemorrhage    Hemorrhoids 10/23/2014   Constipation 06/03/2012   Balance disorder/Fall at home 05/29/2012    Orientation RESPIRATION BLADDER Height & Weight     Self  O2 (2L) Incontinent, External catheter Weight: 121 lb 4.1 oz (55 kg) Height:  5\' 9"  (175.3 cm)  BEHAVIORAL SYMPTOMS/MOOD NEUROLOGICAL BOWEL NUTRITION STATUS      Incontinent Diet (carb modified)  AMBULATORY STATUS COMMUNICATION OF NEEDS Skin    Limited Assist  (patient poor historian per note in chart) Surgical wounds (dressing in place (hip, right), clean dry)                       Personal Care Assistance Level of Assistance  Bathing, Feeding, Dressing   Feeding assistance: Limited assistance Dressing Assistance: Limited assistance     Functional Limitations Info  Sight, Hearing Sight Info: Impaired Hearing Info: Impaired      SPECIAL CARE FACTORS FREQUENCY  PT (By licensed PT), OT (By licensed OT)     PT Frequency: 5x's/week OT Frequency: 5x's/week            Contractures Contractures Info: Not present    Additional Factors Info  Code Status, Allergies Code Status Info: DNR Allergies Info: no known allergies           Current Medications (08/18/2022):  This is the current hospital active medication list Current Facility-Administered Medications  Medication Dose Route Frequency Provider Last Rate Last Admin   acetaminophen (TYLENOL) tablet 650 mg  650 mg Oral Q6H PRN Lovell Sheehan, MD       bisacodyl (DULCOLAX) suppository 10 mg  10 mg Rectal Daily PRN Lovell Sheehan, MD       budesonide (ENTOCORT EC) 24 hr capsule 3 mg  3 mg Oral q AM Lovell Sheehan, MD   3 mg at 08/17/22 9741   ceFAZolin (ANCEF) IVPB 2g/100 mL premix  2 g Intravenous 30 min Pre-Op Lovell Sheehan, MD       diphenhydrAMINE (BENADRYL) 12.5 MG/5ML elixir 12.5-25 mg  12.5-25 mg Oral Q4H PRN Lovell Sheehan, MD       docusate sodium (COLACE) capsule 100 mg  100 mg Oral BID Lovell Sheehan, MD   100 mg at 08/18/22 0949   feeding supplement (ENSURE ENLIVE / ENSURE PLUS) liquid 237 mL  237 mL Oral TID BM Lovell Sheehan, MD   237 mL at 08/18/22 0949   finasteride (PROSCAR) tablet 5 mg  5 mg Oral Daily Lovell Sheehan, MD   5 mg at 08/18/22 0949   HYDROcodone-acetaminophen (NORCO/VICODIN) 5-325 MG per tablet 1 tablet  1 tablet Oral Q6H PRN Lovell Sheehan, MD   1 tablet at 08/17/22 2143   HYDROmorphone (DILAUDID) injection 0.5 mg  0.5  mg Intravenous Q4H PRN Lovell Sheehan, MD       magnesium citrate solution 1 Bottle  1 Bottle Oral Once PRN Lovell Sheehan, MD       memantine [facility]) tablet 10 mg  10 mg Oral BID Lovell Sheehan, MD   10 mg at 08/18/22 0949   methocarbamol (ROBAXIN) tablet 500 mg  500 mg Oral Q6H PRN Lovell Sheehan, MD   500 mg at 08/17/22 2143   Or   methocarbamol (ROBAXIN) 500 mg in dextrose 5 % 50 mL IVPB  500 mg Intravenous Q6H PRN Lovell Sheehan, MD       metoCLOPramide (REGLAN) tablet 5-10 mg  5-10 mg Oral Q8H PRN Lovell Sheehan, MD       Or   metoCLOPramide (REGLAN) injection 5-10 mg  5-10 mg Intravenous Q8H PRN Lovell Sheehan, MD       metoprolol tartrate (LOPRESSOR) tablet 12.5 mg  12.5 mg Oral BID Lovell Sheehan, MD   12.5 mg at 08/18/22 0949   mirabegron ER (MYRBETRIQ) tablet 25 mg  25 mg Oral q AM Lovell Sheehan, MD   25 mg at 08/17/22 7893   multivitamin with minerals tablet 1 tablet  1 tablet Oral Daily Lovell Sheehan, MD   1 tablet at 08/18/22 8101   mupirocin ointment (BACTROBAN) 2 % 1 Application  1 Application Nasal BID Lovell Sheehan, MD   1 Application at 75/10/25 2135   ondansetron (ZOFRAN) tablet 4 mg  4 mg Oral Q6H PRN Lovell Sheehan, MD       Or   ondansetron [facility]) injection 4 mg  4 mg Intravenous Q6H PRN Lovell Sheehan, MD       polyethylene glycol (MIRALAX / GLYCOLAX) packet 17 g  17 g Oral Daily PRN Lovell Sheehan, MD       polyethylene glycol (MIRALAX / GLYCOLAX) packet 17 g  17 g Oral Daily PRN Lovell Sheehan, MD       pravastatin (PRAVACHOL) tablet 80 mg  80 mg Oral q1800 Lovell Sheehan, MD   80 mg at 08/17/22 1745   QUEtiapine (SEROQUEL) tablet 75 mg  75 mg Oral QHS Lovell Sheehan, MD   75 mg at 08/17/22 2143     Discharge Medications: Please see discharge summary for a list of discharge medications.  Relevant Imaging Results:  Relevant Lab Results:   Additional Information    Rolinda Impson D Keith Cancio, LCSW

## 2022-08-18 NOTE — Evaluation (Signed)
Occupational Therapy Evaluation Patient Details Name: Jordan [name] MRN: [id number] DOB: [birth year] Today's Date: 08/18/2022   History of Present Illness 89 y.o. male with medical history significant of dementia, CAD s/p DES to RCA (2009), HFpEF, hypertension, hyperlipidemia, type 2 diabetes, severe aortic stenosis, lymphocytic colitis, BPH who presented to the ED after a fall.  Found to have R hip fx with subsequent IM nailing 08/16/22.   Clinical Impression   Jordan [name] was seen for OT/PT co-evaluation this date, POD#2 from above surgery. Pt noted with history of cognitive impairments at baseline. No family present at bedside to determine baseline. Pt oriented to self and limited situation (aware that he broke his hip) only during session. He follows VCs with multimodal cueing and increased time to perform Pt currently requires MAX assist for LB dressing, bathing, and MOD A for functional mobility due to pain and limited AROM of RR hip. Pt instructed in self care skills, falls prevention strategies, transfer technique during session. Pt would benefit from additional instruction in self care skills and techniques to help maintain precautions with or without assistive devices to support recall and carryover prior to discharge. Recommend STR upon discharge.        Recommendations for follow up therapy are one component of a multi-disciplinary discharge planning process, led by the attending physician.  Recommendations may be updated based on patient status, additional functional criteria and insurance authorization.   Follow Up Recommendations  Skilled nursing-short term rehab (<3 hours/day)     Assistance Recommended at Discharge Frequent or constant Supervision/Assistance  Patient can return home with the following Two people to help with bathing/dressing/bathroom;Two people to help with walking and/or transfers;Help with stairs or ramp for entrance;Assistance with  cooking/housework;Assist for transportation;Direct supervision/assist for medications management;Direct supervision/assist for financial management    Functional Status Assessment  Patient has had a recent decline in their functional status and demonstrates the ability to make significant improvements in function in a reasonable and predictable amount of time.  Equipment Recommendations  BSC/3in1    Recommendations for Other Services       Precautions / Restrictions Precautions Precautions: Fall Precaution Comments: Per chart, pt is legally blind Restrictions Weight Bearing Restrictions: Yes RLE Weight Bearing: Weight bearing as tolerated      Mobility Bed Mobility Overal bed mobility: Needs Assistance Bed Mobility: Supine to Sit     Supine to sit: Max assist     General bed mobility comments: Pt unable to intiaite movement despite much multi-modal cuing.  Could not maintain sitting EOB once assisted up, strong retro lean with inability to bend knees/get feet on the floor.    Transfers Overall transfer level: Needs assistance Equipment used: Rolling walker (2 wheels) Transfers: Sit to/from Stand, Bed to chair/wheelchair/BSC Sit to Stand: Mod assist, +2 physical assistance     Step pivot transfers: Max assist     General transfer comment: pain increased with WB and R knee buckled with every step and while WB.      Balance Overall balance assessment: Needs assistance Sitting-balance support: Feet supported Sitting balance-Leahy Scale: Fair   Postural control: Posterior lean Standing balance support: Reliant on assistive device for balance, During functional activity, Bilateral upper extremity supported Standing balance-Leahy Scale: Poor Standing balance comment: using RW with knee buckling                           ADL either performed or assessed with clinical judgement  ADL Overall ADL's : Needs assistance/impaired                                      Functional mobility during ADLs: Moderate assistance;Rolling walker (2 wheels);Maximal assistance;Cueing for safety;Cueing for sequencing General ADL Comments: Functionally limited by pain and decreased ROM of RLE. Anticipate MAX A for LB ADL management from STS, MAX A for toileting.     Vision Baseline Vision/History: 1 Wears glasses Patient Visual Report: No change from baseline       Perception     Praxis      Pertinent Vitals/Pain Pain Assessment Pain Assessment: 0-10 Pain Score: 7  Pain Location: movement with R hip, causes increased hesitation/minimal calling out Pain Descriptors / Indicators: Discomfort, Grimacing Pain Intervention(s): Limited activity within patient's tolerance, Monitored during session, Repositioned, Patient requesting pain meds-RN notified     Hand Dominance Right   Extremity/Trunk Assessment Upper Extremity Assessment Upper Extremity Assessment: Generalized weakness   Lower Extremity Assessment Lower Extremity Assessment: Generalized weakness;RLE deficits/detail RLE Deficits / Details: s/p R IM nail; WBAT noted buckling with weight bearing during functional mobility   Cervical / Trunk Assessment Cervical / Trunk Assessment: Kyphotic   Communication Communication Communication: HOH   Cognition Arousal/Alertness: Awake/alert Behavior During Therapy: WFL for tasks assessed/performed Overall Cognitive Status: No family/caregiver present to determine baseline cognitive functioning                                 General Comments: Pt oriented to self and limited situation.     General Comments       Exercises Other Exercises Other Exercises: Pt education limited 2/2 cognition, facilitated bed/functional mobility with PT as described above. Pt education provided on safety, falls prevention, and DC recs t/o session. Will continue to provide reinforcement as needed.   Shoulder Instructions      Home Living  Family/patient expects to be discharged to:: Skilled nursing facility Living Arrangements: Children                                      Prior Functioning/Environment Prior Level of Function : Needs assist;Patient poor historian/Family not available;History of Falls (last six months)             Mobility Comments: Pt states he walks at home, is unable to provided information regarding PLOF 2/2 baseline cognition. ADLs Comments: Chart indicates family has been assisting him with increasing supports over last 6 months.        OT Problem List: Decreased strength;Decreased coordination;Pain;Decreased activity tolerance;Decreased safety awareness;Decreased cognition;Impaired balance (sitting and/or standing);Decreased knowledge of use of DME or AE;Decreased knowledge of precautions      OT Treatment/Interventions: Self-care/ADL training;Therapeutic exercise;Therapeutic activities;Balance training;DME and/or AE instruction;Patient/family education    OT Goals(Current goals can be found in the care plan section) Acute Rehab OT Goals Patient Stated Goal: to feel better OT Goal Formulation: With patient Time For Goal Achievement: 09/01/22 Potential to Achieve Goals: Good ADL Goals Pt Will Perform Grooming: sitting;with supervision;with set-up Pt Will Perform Lower Body Dressing: sit to/from stand;with adaptive equipment;with min assist Pt Will Transfer to Toilet: bedside commode;stand pivot transfer;with min assist Pt Will Perform Toileting - Clothing Manipulation and hygiene: sit to/from stand;with min assist;with adaptive equipment  OT  Frequency: Min 2X/week    Co-evaluation PT/OT/SLP Co-Evaluation/Treatment: Yes Reason for Co-Treatment: Complexity of the patient's impairments (multi-system involvement) PT goals addressed during session: Balance;Mobility/safety with mobility OT goals addressed during session: ADL's and self-care;Proper use of Adaptive equipment and  DME      AM-PAC OT "6 Clicks" Daily Activity     Outcome Measure Help from another person eating meals?: A Little Help from another person taking care of personal grooming?: A Little Help from another person toileting, which includes using toliet, bedpan, or urinal?: A Lot Help from another person bathing (including washing, rinsing, drying)?: A Lot Help from another person to put on and taking off regular upper body clothing?: A Little Help from another person to put on and taking off regular lower body clothing?: A Lot 6 Click Score: 15   End of Session Equipment Utilized During Treatment: Gait belt;Rolling walker (2 wheels) Nurse Communication: Patient requests pain meds  Activity Tolerance: Patient tolerated treatment well Patient left: in chair;with call bell/phone within reach;with chair alarm set  OT Visit Diagnosis: Other abnormalities of gait and mobility (R26.89);History of falling (Z91.81);Pain Pain - Right/Left: Right Pain - part of body: Hip                Time: 1050-1111 OT Time Calculation (min): 21 min Charges:  OT General Charges $OT Visit: 1 Visit OT Evaluation $OT Eval Moderate Complexity: 1 Mod  Shara Blazing, M.S., OTR/L 08/18/22, 12:53 PM

## 2022-08-18 NOTE — Progress Notes (Addendum)
CSW spoke with pt's daughter about the one bed offer that has accepted patient [facility]).  Daughter is upset about previous care rec'd from [facility].   Patient admitted from [facility] and prior to that, he was at another facility.   Dtr wanted pt to go to [facility], however, the facility declined.  CSW provided education to [facility] about the process for sending referrals to facilities and how to research information about the facility on [email or web address].

## 2022-08-19 DIAGNOSIS — S72144A Nondisplaced intertrochanteric fracture of right femur, initial encounter for closed fracture: Secondary | ICD-10-CM | POA: Diagnosis not present

## 2022-08-19 LAB — CBC WITH DIFFERENTIAL/PLATELET
Abs Immature Granulocytes: 0.05 10*3/uL (ref 0.00–0.07)
Basophils Absolute: 0 10*3/uL (ref 0.0–0.1)
Basophils Relative: 0 %
Eosinophils Absolute: 0.1 10*3/uL (ref 0.0–0.5)
Eosinophils Relative: 1 %
HCT: 28.6 % — ABNORMAL LOW (ref 39.0–52.0)
Hemoglobin: 9.1 g/dL — ABNORMAL LOW (ref 13.0–17.0)
Immature Granulocytes: 1 %
Lymphocytes Relative: 8 %
Lymphs Abs: 0.7 10*3/uL (ref 0.7–4.0)
MCH: 28.8 pg (ref 26.0–34.0)
MCHC: 31.8 g/dL (ref 30.0–36.0)
MCV: 90.5 fL (ref 80.0–100.0)
Monocytes Absolute: 0.8 10*3/uL (ref 0.1–1.0)
Monocytes Relative: 9 %
Neutro Abs: 7.4 10*3/uL (ref 1.7–7.7)
Neutrophils Relative %: 81 %
Platelets: 288 10*3/uL (ref 150–400)
RBC: 3.16 MIL/uL — ABNORMAL LOW (ref 4.22–5.81)
RDW: 13.8 % (ref 11.5–15.5)
WBC: 9.1 10*3/uL (ref 4.0–10.5)
nRBC: 0 % (ref 0.0–0.2)

## 2022-08-19 LAB — GLUCOSE, CAPILLARY
Glucose-Capillary: 135 mg/dL — ABNORMAL HIGH (ref 70–99)
Glucose-Capillary: 163 mg/dL — ABNORMAL HIGH (ref 70–99)
Glucose-Capillary: 157 mg/dL — ABNORMAL HIGH (ref 70–99)

## 2022-08-19 NOTE — Progress Notes (Signed)
Physical Therapy Treatment Patient Details Name: Jordan [name] MRN: [id number] DOB: 12/23/1922 Today's Date: 08/19/2022   History of Present Illness 89 y.o. male with medical history significant of dementia, CAD s/p DES to RCA (2009), HFpEF, hypertension, hyperlipidemia, type 2 diabetes, severe aortic stenosis, lymphocytic colitis, BPH who presented to the ED after a fall.  Found to have R hip fx with subsequent IM nailing 08/16/22.    PT Comments    Pt was long sitting in bed, awake, upon arrival. He is HOH and has poor vision but alert and conversational. Oriented x 2. Overall pleasantly confused. Was able to tolerate getting OOB and standing to RW. RN staff applied sacral pad in standing. Stood ~ 2 minutes prior to advancing towards recliner. He required extensive assistance to safely take steps from EOB to recliner with vcs throughout for posture correction and improved sequencing. Antalgic step to pattern with slight knee buckling (operative LE) at times when advancing non operative LE.  RN tech in room to assist pt with eating more of his breakfast. Pt is far from baseline abilities. Will greatly benefit from SNF to maximize his independence and safety with ADLs while decreasing caregiver burden. Acute PT will continue to follow per current POC.       Recommendations for follow up therapy are one component of a multi-disciplinary discharge planning process, led by the attending physician.  Recommendations may be updated based on patient status, additional functional criteria and insurance authorization.  Follow Up Recommendations  Skilled nursing-short term rehab (<3 hours/day)     Assistance Recommended at Discharge Frequent or constant Supervision/Assistance  Patient can return home with the following A lot of help with walking and/or transfers;A lot of help with bathing/dressing/bathroom;Assistance with cooking/housework;Assistance with feeding;Direct supervision/assist for  medications management;Direct supervision/assist for financial management;Assist for transportation;Help with stairs or ramp for entrance   Equipment Recommendations  Other (comment) (defer to next level of care)       Precautions / Restrictions Precautions Precautions: Fall Precaution Comments: Per chart, pt is legally blind Restrictions Weight Bearing Restrictions: Yes RLE Weight Bearing: Weight bearing as tolerated     Mobility  Bed Mobility Overal bed mobility: Needs Assistance Bed Mobility: Supine to Sit  Supine to sit: Mod assist, HOB elevated  General bed mobility comments: INcreased time required to exit L side of bed. mod assist with progression of RLE. Pt sat EOB with feet support for ~ 5 minutes without LOB.    Transfers Overall transfer level: Needs assistance Equipment used: Rolling walker (2 wheels) Transfers: Sit to/from Stand Sit to Stand: Mod assist, From elevated surface  General transfer comment: Pt required mod assist to safely stnd from elevated bed height. vcs for posture correction and extension of knees. RN tech arrived and placed sacral pad while in standing. Pt is a little fearful of falling. tolerated standing ~ 2 minute prior to requesting seated rest.    Ambulation/Gait Ambulation/Gait assistance: Mod assist Gait Distance (Feet): 3 Feet Assistive device: Rolling walker (2 wheels) Gait Pattern/deviations: Step-to pattern, Antalgic, Trunk flexed, Knees buckling Gait velocity: decreased     General Gait Details: Pt was able to ambulate from EOB to recliner taking ~ 5 antalgic steps. poor posture during transfers/gait. Pt fatigued quickly. was repositoned in recliner to allow RN tech to assist him with eating more of his breakfast.    Balance Overall balance assessment: Needs assistance Sitting-balance support: Feet supported Sitting balance-Leahy Scale: Fair     Standing balance support: Reliant on  assistive device for balance, During functional  activity, Bilateral upper extremity supported Standing balance-Leahy Scale: Poor Standing balance comment: high fall risk even with BUE support on RW       Cognition Arousal/Alertness: Awake/alert Behavior During Therapy: WFL for tasks assessed/performed Overall Cognitive Status: No family/caregiver present to determine baseline cognitive functioning      General Comments: Pt is alert and oriented x 2. pleasantly confused however able to follow commands consistently throughout.           General Comments General comments (skin integrity, edema, etc.): Reviewed importance of performing exercises on RLE throughout the day      Pertinent Vitals/Pain Pain Assessment Pain Assessment: 0-10 Pain Score: 7  (with movements)     PT Goals (current goals can now be found in the care plan section) Acute Rehab PT Goals Patient Stated Goal: none stated Progress towards PT goals: Progressing toward goals    Frequency    7X/week      PT Plan Current plan remains appropriate    Co-evaluation     PT goals addressed during session: Mobility/safety with mobility;Balance;Proper use of DME        AM-PAC PT "6 Clicks" Mobility   Outcome Measure  Help needed turning from your back to your side while in a flat bed without using bedrails?: A Lot Help needed moving from lying on your back to sitting on the side of a flat bed without using bedrails?: A Lot Help needed moving to and from a bed to a chair (including a wheelchair)?: A Lot Help needed standing up from a chair using your arms (e.g., wheelchair or bedside chair)?: A Lot Help needed to walk in hospital room?: A Lot Help needed climbing 3-5 steps with a railing? : Total 6 Click Score: 11    End of Session   Activity Tolerance: Patient tolerated treatment well;Patient limited by fatigue;Patient limited by pain Patient left: in chair;with call bell/phone within reach;with chair alarm set;with nursing/sitter in room Nurse  Communication: Mobility status PT Visit Diagnosis: Unsteadiness on feet (R26.81);Other abnormalities of gait and mobility (R26.89);Muscle weakness (generalized) (M62.81)     Time: 6378-5885 PT Time Calculation (min) (ACUTE ONLY): 15 min  Charges:  $Therapeutic Activity: 8-22 mins                     Julaine Fusi PTA 08/19/22, 10:59 AM

## 2022-08-19 NOTE — Progress Notes (Addendum)
Progress Note    Jordan [name]  MWN:[id number] DOB: [birth year]  DOA: 08/15/2022 PCP: Asencion Noble, MD      Brief Narrative:    Medical records reviewed and are as summarized below:  Jordan [name] is a 89 y.o. male with medical history significant of dementia, CAD s/p DES to RCA (2009), HFpEF, hypertension, hyperlipidemia, type 2 diabetes, severe aortic stenosis, lymphocytic colitis, BPH, who presented from [place] home to the hospital after a fall.  Workup revealed right hip fracture.    Assessment/Plan:   Principal Problem:   Femur fracture (HCC) Active Problems:   Dementia with behavioral disturbance (HCC)   Chronic diastolic CHF (congestive heart failure) (HCC)   Type 2 diabetes mellitus with complication (HCC)   Hypertension   Malnutrition of moderate degree   Body mass index is 17.91 kg/m.   Mildly displaced intertrochanteric comminuted right hip fracture, s/p fall: S/p right intramedullary nail intertrochanteric on 08/06/2022.  Use analgesics as needed for pain.  Outpatient follow-up with orthopedic surgeon.   Acute blood loss anemia from hip surgery: No indication for blood transfusion at this time.  Repeat H&H is pending   Type II DM with mild hyperglycemia; hypoglycemia on 08/16/2022.  Glucose level was 65 at 10 PM on 08/16/2022.  Glucose levels are okay.  Continue carb modified diet. Hemoglobin A1c was 7.2 on 06/22/2022.  Metformin on hold.  Dementia with behavioral disturbance: Continue memantine and Seroquel.   Chronic diastolic CHF, severe aortic stenosis: Compensated.  2D echo in October 2023 showed EF estimated at 60 to 65%, moderate LVH, grade 1 diastolic dysfunction, moderate aortic regurgitation, moderate to severe aortic stenosis   Other comorbidities include hypertension on metoprolol, lymphocytic colitis on budesonide     Diet Order             Diet Carb Modified Fluid consistency: Thin; Room service  appropriate? Yes  Diet effective now                            Consultants: Orthopedic surgeon  Procedures: Right intramedullary nail intertrochanteric    Medications:    budesonide  3 mg Oral q AM   docusate sodium  100 mg Oral BID   feeding supplement  237 mL Oral TID BM   finasteride  5 mg Oral Daily   memantine  10 mg Oral BID   metoprolol tartrate  12.5 mg Oral BID   mirabegron ER  25 mg Oral q AM   multivitamin with minerals  1 tablet Oral Daily   mupirocin ointment  1 Application Nasal BID   pravastatin  80 mg Oral q1800   QUEtiapine  75 mg Oral QHS   Continuous Infusions:   ceFAZolin (ANCEF) IV     methocarbamol (ROBAXIN) IV       Anti-infectives (From admission, onward)    Start     Dose/Rate Route Frequency Ordered Stop   08/17/22 0300  ceFAZolin (ANCEF) IVPB 2g/100 mL premix        2 g 200 mL/hr over 30 Minutes Intravenous Every 8 hours 08/16/22 2301 08/17/22 1121   08/16/22 1748  ceFAZolin (ANCEF) IVPB 2g/100 mL premix        2 g 200 mL/hr over 30 Minutes Intravenous 30 min pre-op 08/16/22 1749                Family Communication/Anticipated D/C date and plan/Code Status  DVT prophylaxis: SCDs Start: 08/16/22 2302     Code Status: DNR  Family Communication: None Disposition Plan: Plan to discharge to SNF in 1 to 2 days   Status is: Inpatient Remains inpatient appropriate because: Awaiting placement to SNF       Subjective:   Interval events noted.  He has no complaints.  No pain in the right hip.  Objective:    Vitals:   08/18/22 2221 08/18/22 2345 08/19/22 0828 08/19/22 1210  BP: (!) 135/49 (!) 115/51 (!) 134/53 (!) 138/57  Pulse: 78 77 77 96  Resp:  18 16 16   Temp:  99.2 F (37.3 C) 98.4 F (36.9 C) 97.7 F (36.5 C)  TempSrc:      SpO2:  99% 99% 100%  Weight:      Height:       No data found.   Intake/Output Summary (Last 24 hours) at 08/19/2022 1223 Last data filed at 08/19/2022 0650 Gross per  24 hour  Intake 240 ml  Output 600 ml  Net -360 ml   Filed Weights   08/15/22 2007  Weight: 55 kg    Exam:  GEN: NAD SKIN: Warm and dry EYES: No pallor or icterus ENT: MMM CV: RRR PULM: CTA B ABD: soft, ND, NT, +BS CNS: AAO x 3, non focal EXT: Mild right hip swelling.  Dressing on right hip surgical incisional wound is bloodstained     Data Reviewed:   I have personally reviewed following labs and imaging studies:  Labs: Labs show the following:   Basic Metabolic Panel: Recent Labs  Lab 08/15/22 2010 08/16/22 0602  NA 138 136  K 3.8 3.8  CL 103 106  CO2 25 27  GLUCOSE 211* 154*  BUN 28* 27*  CREATININE 1.06 0.94  CALCIUM 8.2* 7.9*   GFR Estimated Creatinine Clearance: 34.1 mL/min (by C-G formula based on SCr of 0.94 mg/dL). Liver Function Tests: No results for input(s): "AST", "ALT", "ALKPHOS", "BILITOT", "PROT", "ALBUMIN" in the last 168 hours. No results for input(s): "LIPASE", "AMYLASE" in the last 168 hours. No results for input(s): "AMMONIA" in the last 168 hours. Coagulation profile No results for input(s): "INR", "PROTIME" in the last 168 hours.  CBC: Recent Labs  Lab 08/15/22 2010 08/16/22 0602 08/17/22 0818 08/18/22 0231  WBC 6.0 7.2 6.9 8.8  NEUTROABS  --  5.6  --  6.6  HGB 10.2* 9.8* 9.5* 8.8*  HCT 33.0* 30.5* 29.2* 27.3*  MCV 92.7 89.2 88.0 90.4  PLT 297 261 247 237   Cardiac Enzymes: No results for input(s): "CKTOTAL", "CKMB", "CKMBINDEX", "TROPONINI" in the last 168 hours. BNP (last 3 results) No results for input(s): "PROBNP" in the last 8760 hours. CBG: Recent Labs  Lab 08/18/22 1153 08/18/22 1705 08/18/22 2128 08/19/22 0913 08/19/22 1204  GLUCAP 143* 142* 148* 135* 163*   D-Dimer: No results for input(s): "DDIMER" in the last 72 hours. Hgb A1c: No results for input(s): "HGBA1C" in the last 72 hours. Lipid Profile: No results for input(s): "CHOL", "HDL", "LDLCALC", "TRIG", "CHOLHDL", "LDLDIRECT" in the last 72  hours. Thyroid function studies: No results for input(s): "TSH", "T4TOTAL", "T3FREE", "THYROIDAB" in the last 72 hours.  Invalid input(s): "FREET3" Anemia work up: No results for input(s): "VITAMINB12", "FOLATE", "FERRITIN", "TIBC", "IRON", "RETICCTPCT" in the last 72 hours. Sepsis Labs: Recent Labs  Lab 08/15/22 2010 08/16/22 0602 08/17/22 0818 08/18/22 0231  WBC 6.0 7.2 6.9 8.8    Microbiology Recent Results (from the past 240 hour(s))  Surgical  PCR screen     Status: None   Collection Time: 08/16/22  3:20 PM   Specimen: Nasal Mucosa; Nasal Swab  Result Value Ref Range Status   MRSA, PCR NEGATIVE NEGATIVE Final   Staphylococcus aureus NEGATIVE NEGATIVE Final    Comment: (NOTE) The Xpert SA Assay (FDA approved for NASAL specimens in patients 1 years of age and older), is one component of a comprehensive surveillance program. It is not intended to diagnose infection nor to guide or monitor treatment. Performed at [facility], [address] [place], [place] [address]     Procedures and diagnostic studies:  No results found.             LOS: 4 days   Annalicia Renfrew  [facility]   Pager on [email or web address]. If 7PM-7AM, please contact night-coverage at [email or web address]     08/19/2022, 12:23 PM

## 2022-08-19 NOTE — Plan of Care (Signed)

## 2022-08-20 DIAGNOSIS — S72144A Nondisplaced intertrochanteric fracture of right femur, initial encounter for closed fracture: Secondary | ICD-10-CM | POA: Diagnosis not present

## 2022-08-20 LAB — GLUCOSE, CAPILLARY
Glucose-Capillary: 118 mg/dL — ABNORMAL HIGH (ref 70–99)
Glucose-Capillary: 120 mg/dL — ABNORMAL HIGH (ref 70–99)
Glucose-Capillary: 132 mg/dL — ABNORMAL HIGH (ref 70–99)
Glucose-Capillary: 140 mg/dL — ABNORMAL HIGH (ref 70–99)

## 2022-08-20 NOTE — Plan of Care (Signed)
  Problem: Education: Goal: Ability to describe self-care measures that may prevent or decrease complications (Diabetes Survival Skills Education) will improve Outcome: Progressing Goal: Individualized Educational Video(s) Outcome: Progressing   Problem: Coping: Goal: Ability to adjust to condition or change in health will improve Outcome: Progressing   Problem: Fluid Volume: Goal: Ability to maintain a balanced intake and output will improve Outcome: Progressing   Problem: Metabolic: Goal: Ability to maintain appropriate glucose levels will improve Outcome: Progressing   Problem: Nutritional: Goal: Maintenance of adequate nutrition will improve Outcome: Progressing Goal: Progress toward achieving an optimal weight will improve Outcome: Progressing   Problem: Skin Integrity: Goal: Risk for impaired skin integrity will decrease Outcome: Progressing   Problem: Tissue Perfusion: Goal: Adequacy of tissue perfusion will improve Outcome: Progressing   Problem: Education: Goal: Knowledge of General Education information will improve Description: Including pain rating scale, medication(s)/side effects and non-pharmacologic comfort measures Outcome: Progressing   Problem: Health Behavior/Discharge Planning: Goal: Ability to manage health-related needs will improve Outcome: Progressing   Problem: Clinical Measurements: Goal: Ability to maintain clinical measurements within normal limits will improve Outcome: Progressing Goal: Will remain free from infection Outcome: Progressing Goal: Diagnostic test results will improve Outcome: Progressing Goal: Respiratory complications will improve Outcome: Progressing Goal: Cardiovascular complication will be avoided Outcome: Progressing   Problem: Activity: Goal: Risk for activity intolerance will decrease Outcome: Progressing   Problem: Nutrition: Goal: Adequate nutrition will be maintained Outcome: Progressing   Problem:  Elimination: Goal: Will not experience complications related to bowel motility Outcome: Progressing Goal: Will not experience complications related to urinary retention Outcome: Progressing   Problem: Pain Managment: Goal: General experience of comfort will improve Outcome: Progressing   Problem: Skin Integrity: Goal: Risk for impaired skin integrity will decrease Outcome: Progressing

## 2022-08-20 NOTE — Plan of Care (Signed)

## 2022-08-20 NOTE — Progress Notes (Signed)
Physical Therapy Treatment Patient Details Name: Jordan [name] MRN: [id number] DOB: 1923/02/23 Today's Date: 08/20/2022   History of Present Illness 89 y.o. male with medical history significant of dementia, CAD s/p DES to RCA (2009), HFpEF, hypertension, hyperlipidemia, type 2 diabetes, severe aortic stenosis, lymphocytic colitis, BPH who presented to the ED after a fall.  Found to have R hip fx with subsequent IM nailing 08/16/22.    PT Comments    Pt in bed, agrees to OOB to chair for breakfast.  Assisted to sitting with mod a x 1.  Generally steady in sitting with supervision.  He does stand with RW and mod a x 1 and while he can move RLE, he is unable to step on it to advance LLE to step to chair.  He returns to sitting and is able to stand pivot to recliner with max a x 1.  Attempted AROM RLE but he resists "it's broken"  Confusion limits education and further ex at this time.   Recommendations for follow up therapy are one component of a multi-disciplinary discharge planning process, led by the attending physician.  Recommendations may be updated based on patient status, additional functional criteria and insurance authorization.  Follow Up Recommendations  Skilled nursing-short term rehab (<3 hours/day)     Assistance Recommended at Discharge Frequent or constant Supervision/Assistance  Patient can return home with the following A lot of help with walking and/or transfers;A lot of help with bathing/dressing/bathroom;Assistance with cooking/housework;Assistance with feeding;Direct supervision/assist for medications management;Direct supervision/assist for financial management;Assist for transportation;Help with stairs or ramp for entrance   Equipment Recommendations  Other (comment) (defer to next level of care)    Recommendations for Other Services       Precautions / Restrictions Precautions Precautions: Fall Precaution Comments: Per chart, pt is legally  blind Restrictions Weight Bearing Restrictions: Yes RLE Weight Bearing: Weight bearing as tolerated     Mobility  Bed Mobility Overal bed mobility: Needs Assistance Bed Mobility: Supine to Sit     Supine to sit: Mod assist, HOB elevated     General bed mobility comments: INcreased time required to exit L side of bed. mod assist with progression of RLE. Pt sat EOB with feet support for ~ 5 minutes without LOB. Patient Response: Cooperative (confused)  Transfers Overall transfer level: Needs assistance Equipment used: Rolling walker (2 wheels) Transfers: Sit to/from Stand Sit to Stand: Mod assist, From elevated surface Stand pivot transfers: Max assist         General transfer comment: Able to stand with RW but no safetly step.  stand pivot to chair without AD    Ambulation/Gait Ambulation/Gait assistance: Mod assist   Assistive device: Rolling walker (2 wheels) Gait Pattern/deviations: Step-to pattern, Antalgic, Trunk flexed, Knees buckling Gait velocity: decreased     General Gait Details: Pt was able to ambulate from EOB to recliner taking ~ 5 antalgic steps. poor posture during transfers/gait. Pt fatigued quickly. was repositoned in recliner to allow RN tech to assist him with eating more of his breakfast.   Stairs             Wheelchair Mobility    Modified Rankin (Stroke Patients Only)       Balance Overall balance assessment: Needs assistance Sitting-balance support: Feet supported Sitting balance-Leahy Scale: Fair     Standing balance support: Reliant on assistive device for balance, During functional activity, Bilateral upper extremity supported Standing balance-Leahy Scale: Poor Standing balance comment: high fall risk even with BUE  support on RW                            Cognition Arousal/Alertness: Awake/alert Behavior During Therapy: WFL for tasks assessed/performed Overall Cognitive Status: No family/caregiver present to  determine baseline cognitive functioning                                          Exercises      General Comments        Pertinent Vitals/Pain Pain Assessment Pain Assessment: Faces Faces Pain Scale: Hurts little more Pain Location: movement with R hip, causes increased hesitation/minimal calling out Pain Descriptors / Indicators: Discomfort, Grimacing Pain Intervention(s): Limited activity within patient's tolerance, Monitored during session, Repositioned    Home Living                          Prior Function            PT Goals (current goals can now be found in the care plan section) Progress towards PT goals: Progressing toward goals    Frequency    7X/week      PT Plan Current plan remains appropriate    Co-evaluation              AM-PAC PT "6 Clicks" Mobility   Outcome Measure  Help needed turning from your back to your side while in a flat bed without using bedrails?: A Lot Help needed moving from lying on your back to sitting on the side of a flat bed without using bedrails?: A Lot Help needed moving to and from a bed to a chair (including a wheelchair)?: Total Help needed standing up from a chair using your arms (e.g., wheelchair or bedside chair)?: A Lot Help needed to walk in hospital room?: Total Help needed climbing 3-5 steps with a railing? : Total 6 Click Score: 9    End of Session   Activity Tolerance: Patient tolerated treatment well;Patient limited by fatigue;Patient limited by pain Patient left: in chair;with call bell/phone within reach;with chair alarm set;with nursing/sitter in room Nurse Communication: Mobility status PT Visit Diagnosis: Unsteadiness on feet (R26.81);Other abnormalities of gait and mobility (R26.89);Muscle weakness (generalized) (M62.81)     Time: 1829-9371 PT Time Calculation (min) (ACUTE ONLY): 10 min  Charges:  $Therapeutic Activity: 8-22 mins                   Chesley Noon,  PTA 08/20/22, 9:52 AM

## 2022-08-20 NOTE — Progress Notes (Signed)
Progress Note    Jordan [name]  CBJ:[id number] DOB: [birth year]  DOA: 08/15/2022 PCP: Asencion Noble, MD      Brief Narrative:    Medical records reviewed and are as summarized below:  Jordan [name] is a 89 y.o. male with medical history significant of dementia, CAD s/p DES to RCA (2009), HFpEF, hypertension, hyperlipidemia, type 2 diabetes, severe aortic stenosis, lymphocytic colitis, BPH, who presented from [place] home to the hospital after a fall.  Workup revealed right hip fracture.    Assessment/Plan:   Principal Problem:   Femur fracture (HCC) Active Problems:   Dementia with behavioral disturbance (HCC)   Chronic diastolic CHF (congestive heart failure) (HCC)   Type 2 diabetes mellitus with complication (HCC)   Hypertension   Malnutrition of moderate degree   Body mass index is 17.91 kg/m.   Mildly displaced intertrochanteric comminuted right hip fracture, s/p fall: S/p right intramedullary nail intertrochanteric on 08/06/2022.  Analgesics as needed for pain.   Acute blood loss anemia from hip surgery: H&H is stable.  No indication for blood transfusion at this time.  Repeat H&H is pending   Type II DM with mild hyperglycemia; hypoglycemia on 08/16/2022.  Glucose levels are optimal.  Continue carb modified diet.  Hemoglobin A1c was 7.2 on 06/22/2022.  Metformin on hold.  Dementia with behavioral disturbance, visual hallucinations: Continue memantine and Seroquel.   Chronic diastolic CHF, severe aortic stenosis: Compensated.  2D echo in October 2023 showed EF estimated at 60 to 65%, moderate LVH, grade 1 diastolic dysfunction, moderate aortic regurgitation, moderate to severe aortic stenosis   Other comorbidities include hypertension on metoprolol, lymphocytic colitis on budesonide     Diet Order             Diet Carb Modified Fluid consistency: Thin; Room service appropriate? Yes  Diet effective now                             Consultants: Orthopedic surgeon  Procedures: Right intramedullary nail intertrochanteric    Medications:    budesonide  3 mg Oral q AM   docusate sodium  100 mg Oral BID   feeding supplement  237 mL Oral TID BM   finasteride  5 mg Oral Daily   memantine  10 mg Oral BID   metoprolol tartrate  12.5 mg Oral BID   mirabegron ER  25 mg Oral q AM   multivitamin with minerals  1 tablet Oral Daily   mupirocin ointment  1 Application Nasal BID   pravastatin  80 mg Oral q1800   QUEtiapine  75 mg Oral QHS   Continuous Infusions:   ceFAZolin (ANCEF) IV     methocarbamol (ROBAXIN) IV       Anti-infectives (From admission, onward)    Start     Dose/Rate Route Frequency Ordered Stop   08/17/22 0300  ceFAZolin (ANCEF) IVPB 2g/100 mL premix        2 g 200 mL/hr over 30 Minutes Intravenous Every 8 hours 08/16/22 2301 08/17/22 1121   08/16/22 1748  ceFAZolin (ANCEF) IVPB 2g/100 mL premix        2 g 200 mL/hr over 30 Minutes Intravenous 30 min pre-op 08/16/22 1749                Family Communication/Anticipated D/C date and plan/Code Status   DVT prophylaxis: SCDs Start: 08/16/22 2302  Code Status: DNR  Family Communication: None Disposition Plan: Plan to discharge to SNF in 1 to 2 days   Status is: Inpatient Remains inpatient appropriate because: Awaiting placement to SNF       Subjective:   He is confused and unable to provide any history.  He said there were 3 men in the room.  He is having visual hallucinations.  Objective:    Vitals:   08/19/22 1626 08/19/22 2144 08/19/22 2308 08/20/22 0824  BP: (!) 112/56 (!) 143/58 (!) 138/52 (!) 132/57  Pulse: 90 94 89 85  Resp: 16  20 16   Temp: 98.4 F (36.9 C)  98.4 F (36.9 C) 98.7 F (37.1 C)  TempSrc:      SpO2: 97%  100% 100%  Weight:      Height:       No data found.   Intake/Output Summary (Last 24 hours) at 08/20/2022 1017 Last data filed at 08/20/2022 0931 Gross per 24  hour  Intake 237 ml  Output 1350 ml  Net -1113 ml   Filed Weights   08/15/22 2007  Weight: 55 kg    Exam:   GEN: NAD SKIN: No rash EYES: EOMI ENT: MMM CV: RRR PULM: CTA B ABD: soft, ND, NT, +BS CNS: Alert but confused, non focal EXT: Dressing on right hip surgical wound is dry and intact.  Mild swelling in the right hip PSYCH: + Visual hallucinations    Data Reviewed:   I have personally reviewed following labs and imaging studies:  Labs: Labs show the following:   Basic Metabolic Panel: Recent Labs  Lab 08/15/22 2010 08/16/22 0602  NA 138 136  K 3.8 3.8  CL 103 106  CO2 25 27  GLUCOSE 211* 154*  BUN 28* 27*  CREATININE 1.06 0.94  CALCIUM 8.2* 7.9*   GFR Estimated Creatinine Clearance: 34.1 mL/min (by C-G formula based on SCr of 0.94 mg/dL). Liver Function Tests: No results for input(s): "AST", "ALT", "ALKPHOS", "BILITOT", "PROT", "ALBUMIN" in the last 168 hours. No results for input(s): "LIPASE", "AMYLASE" in the last 168 hours. No results for input(s): "AMMONIA" in the last 168 hours. Coagulation profile No results for input(s): "INR", "PROTIME" in the last 168 hours.  CBC: Recent Labs  Lab 08/15/22 2010 08/16/22 0602 08/17/22 0818 08/18/22 0231 08/19/22 1725  WBC 6.0 7.2 6.9 8.8 9.1  NEUTROABS  --  5.6  --  6.6 7.4  HGB 10.2* 9.8* 9.5* 8.8* 9.1*  HCT 33.0* 30.5* 29.2* 27.3* 28.6*  MCV 92.7 89.2 88.0 90.4 90.5  PLT 297 261 247 237 288   Cardiac Enzymes: No results for input(s): "CKTOTAL", "CKMB", "CKMBINDEX", "TROPONINI" in the last 168 hours. BNP (last 3 results) No results for input(s): "PROBNP" in the last 8760 hours. CBG: Recent Labs  Lab 08/18/22 2128 08/19/22 0913 08/19/22 1204 08/19/22 2155 08/20/22 0730  GLUCAP 148* 135* 163* 157* 118*   D-Dimer: No results for input(s): "DDIMER" in the last 72 hours. Hgb A1c: No results for input(s): "HGBA1C" in the last 72 hours. Lipid Profile: No results for input(s): "CHOL",  "HDL", "LDLCALC", "TRIG", "CHOLHDL", "LDLDIRECT" in the last 72 hours. Thyroid function studies: No results for input(s): "TSH", "T4TOTAL", "T3FREE", "THYROIDAB" in the last 72 hours.  Invalid input(s): "FREET3" Anemia work up: No results for input(s): "VITAMINB12", "FOLATE", "FERRITIN", "TIBC", "IRON", "RETICCTPCT" in the last 72 hours. Sepsis Labs: Recent Labs  Lab 08/16/22 0602 08/17/22 0818 08/18/22 0231 08/19/22 1725  WBC 7.2 6.9 8.8 9.1  Microbiology Recent Results (from the past 240 hour(s))  Surgical PCR screen     Status: None   Collection Time: 08/16/22  3:20 PM   Specimen: Nasal Mucosa; Nasal Swab  Result Value Ref Range Status   MRSA, PCR NEGATIVE NEGATIVE Final   Staphylococcus aureus NEGATIVE NEGATIVE Final    Comment: (NOTE) The Xpert SA Assay (FDA approved for NASAL specimens in patients 12 years of age and older), is one component of a comprehensive surveillance program. It is not intended to diagnose infection nor to guide or monitor treatment. Performed at [facility], [address] [place], [place] [address]     Procedures and diagnostic studies:  No results found.             LOS: 5 days   Toluwanimi Radebaugh  [facility]   Pager on [email or web address]. If 7PM-7AM, please contact night-coverage at [email or web address]     08/20/2022, 10:17 AM

## 2022-08-21 DIAGNOSIS — S72144A Nondisplaced intertrochanteric fracture of right femur, initial encounter for closed fracture: Secondary | ICD-10-CM | POA: Diagnosis not present

## 2022-08-21 LAB — GLUCOSE, CAPILLARY: Glucose-Capillary: 93 mg/dL (ref 70–99)

## 2022-08-21 NOTE — Plan of Care (Signed)

## 2022-08-21 NOTE — Progress Notes (Signed)
OT Cancellation Note  Patient Details Name: Jordan [name] MRN: [id number] DOB: 1922-10-09   Cancelled Treatment:    Reason Eval/Treat Not Completed: Fatigue/lethargy limiting ability to participate. Spoke with RN before seeing pt who reports pt has been lethargic all day. Upon entering room, pt asleep in bed with mitts in place. Pt unable to arouse even with Max VC, tactile stimulation, and repositioning in bed. Unable to follow commands. Will re-attempt at later date/time.   Lanelle Bal  [facility] 08/21/2022, 2:33 PM

## 2022-08-21 NOTE — Consult Note (Signed)
   [facility] Inpatient Consult   08/21/2022  [name] [birth year] [id number]  [facility] [ACO] Patient: Jordan [name] [place]  *[facility] Liaison remote coverage review for patient admitted to [facility]   Primary Care Provider:  Asencion Noble, MD   Patient screened for less than 30 days readmission hospitalization with noted extreme high risk score for unplanned readmission risk.  Review of patient's electronic medical record reveals patient is still minimally responsive and notes for ongoing 24 hour needs and SNF recommended. Patient was from [place]:  Continue to follow progress and disposition to assess for post hospital community care coordination/management needs recommended for SNF.  Referral request for community care coordination: none planned as patient is for a skilled facility for community transition.  Of note, [facility] Care Management/Population Health does not replace or interfere with any arrangements made by the Inpatient Transition of Care team.  For questions contact:   Natividad Brood, RN BSN [place]  [phone] business mobile phone Toll free office [phone]  *[place]  [phone] Fax number: [phone] [email or web address]

## 2022-08-21 NOTE — TOC Progression Note (Addendum)
Transition of Care [facility]) - Progression Note    Patient Details  Name: Jordan [name] MRN: [id number] Date of Birth: 1923/06/06  Transition of Care [facility]) CM/SW [place], RN Phone Number: 08/21/2022, 10:39 AM  Clinical Narrative:     Confirmed with [facility] that the patient was there for Short term rehab and not long term resident, he has used 15 bed days Resent the bedsearch out to facilities that have not delclined  Expected Discharge Plan:  (unknown as of right now) Barriers to Discharge: Continued Medical Work up  Expected Discharge Plan and Services       Living arrangements for the past 2 months: Single Family Home                                       Social Determinants of Health (SDOH) Interventions SDOH Screenings   Food Insecurity: No Food Insecurity (08/16/2022)  Housing: Low Risk  (08/16/2022)  Transportation Needs: No Transportation Needs (08/16/2022)  Utilities: Not At Risk (08/16/2022)  Tobacco Use: Medium Risk (08/17/2022)    Readmission Risk Interventions    07/18/2022    2:18 PM 07/14/2022   11:46 AM 07/11/2022   12:11 PM  Readmission Risk Prevention Plan  Transportation Screening  Complete Complete  PCP or Specialist Appt within 3-5 Days  Complete Complete  HRI or [place]  Complete Complete  Social Work Consult for [place] Planning/Counseling  Complete Complete  Palliative Care Screening  Not Applicable Not Applicable  Medication Review Press photographer)  Complete Complete  PCP or Specialist appointment within 3-5 days of discharge Complete    HRI or [place] Complete    SW Recovery Care/Counseling Consult Complete    [place] Not Applicable

## 2022-08-21 NOTE — Progress Notes (Signed)
PT Cancellation Note  Patient Details Name: Jordan [name] MRN: [id number] DOB: 1922-11-06   Cancelled Treatment:    Reason Eval/Treat Not Completed: Fatigue/lethargy limiting ability to participate  Pt sleeping soundly upon arrival.  Repositioned for comfort and attempt to awaken pt for lunch.  He did not stir.  Will attempt to reschedule after lunch with another therapist if able, if not resume tomorrow.   Chesley Noon 08/21/2022, 12:26 PM

## 2022-08-21 NOTE — TOC Progression Note (Signed)
Transition of Care [facility]) - Progression Note    Patient Details  Name: Jordan [name] MRN: [id number] Date of Birth: 15-Dec-1922  Transition of Care [facility]) CM/SW [place], RN Phone Number: 08/21/2022, 12:46 PM  Clinical Narrative:    Spoke with the daughter [name] and reviewed the bed options that are currently in place, she will review them and call me back   Expected Discharge Plan:  (unknown as of right now) Barriers to Discharge: Continued Medical Work up  Expected Discharge Plan and [place] arrangements for the past 2 months: Single Family Home                                       Social Determinants of Health (SDOH) Interventions [place]: No Food Insecurity (08/16/2022)  Housing: Low Risk  (08/16/2022)  Transportation Needs: No Transportation Needs (08/16/2022)  Utilities: Not At Risk (08/16/2022)  Tobacco Use: Medium Risk (08/17/2022)    Readmission Risk Interventions    07/18/2022    2:18 PM 07/14/2022   11:46 AM 07/11/2022   12:11 PM  Readmission Risk Prevention Plan  Transportation Screening  Complete Complete  PCP or Specialist Appt within 3-5 Days  Complete Complete  HRI or [place]  Complete Complete  Social Work Consult for [place] Planning/Counseling  Complete Complete  Palliative Care Screening  Not Applicable Not Applicable  Medication Review Press photographer)  Complete Complete  PCP or Specialist appointment within 3-5 days of discharge Complete    HRI or [place] Complete    SW Recovery Care/Counseling Consult Complete    [place] Not Applicable

## 2022-08-21 NOTE — Care Management Important Message (Signed)
Important Message  Patient Details  Name: Jordan [name] MRN: [id number] Date of Birth: 1923-04-23   Medicare Important Message Given:  Yes  Patient asleep upon time of visit, no family in room. Reviewed Medicare IM with [name], daughter, at [phone]. Aware copy of Medicare IM left in room for reference.     Dannette Barbara 08/21/2022, 1:56 PM

## 2022-08-21 NOTE — Progress Notes (Signed)
Progress Note    Jordan [name]  FBP:[id number] DOB: [birth year]  DOA: 08/15/2022 PCP: Asencion Noble, MD      Brief Narrative:    Medical records reviewed and are as summarized below:  Jordan [name] is a 89 y.o. male with medical history significant of dementia, CAD s/p DES to RCA (2009), HFpEF, hypertension, hyperlipidemia, type 2 diabetes, severe aortic stenosis, lymphocytic colitis, BPH, who presented from [place] home to the hospital after a fall.  Workup revealed right hip fracture.    Assessment/Plan:   Principal Problem:   Femur fracture (HCC) Active Problems:   Dementia with behavioral disturbance (HCC)   Chronic diastolic CHF (congestive heart failure) (HCC)   Type 2 diabetes mellitus with complication (HCC)   Hypertension   Malnutrition of moderate degree   Body mass index is 17.91 kg/m.   Mildly displaced intertrochanteric comminuted right hip fracture, s/p fall: S/p right intramedullary nail intertrochanteric on 08/06/2022.  Use analgesics as needed for pain.  Continue PT and OT while inpatient.   Acute blood loss anemia from hip surgery: H&H is stable.  No indication for blood transfusion at this time.     Type II DM with mild hyperglycemia; hypoglycemia on 08/16/2022.  Glucose levels are optimal.  Continue carb modified diet.  Hemoglobin A1c was 7.2 on 06/22/2022.  Metformin on hold.  Dementia with behavioral disturbance, visual hallucinations: Continue memantine and Seroquel.   Chronic diastolic CHF, severe aortic stenosis: Compensated.  2D echo in October 2023 showed EF estimated at 60 to 65%, moderate LVH, grade 1 diastolic dysfunction, moderate aortic regurgitation, moderate to severe aortic stenosis   Other comorbidities include hypertension on metoprolol, lymphocytic colitis on budesonide     Diet Order             Diet Carb Modified Fluid consistency: Thin; Room service appropriate? Yes  Diet effective now                             Consultants: Orthopedic surgeon  Procedures: Right intramedullary nail intertrochanteric    Medications:    budesonide  3 mg Oral q AM   docusate sodium  100 mg Oral BID   feeding supplement  237 mL Oral TID BM   finasteride  5 mg Oral Daily   memantine  10 mg Oral BID   metoprolol tartrate  12.5 mg Oral BID   mirabegron ER  25 mg Oral q AM   multivitamin with minerals  1 tablet Oral Daily   mupirocin ointment  1 Application Nasal BID   pravastatin  80 mg Oral q1800   QUEtiapine  75 mg Oral QHS   Continuous Infusions:   ceFAZolin (ANCEF) IV     methocarbamol (ROBAXIN) IV       Anti-infectives (From admission, onward)    Start     Dose/Rate Route Frequency Ordered Stop   08/17/22 0300  ceFAZolin (ANCEF) IVPB 2g/100 mL premix        2 g 200 mL/hr over 30 Minutes Intravenous Every 8 hours 08/16/22 2301 08/17/22 1121   08/16/22 1748  ceFAZolin (ANCEF) IVPB 2g/100 mL premix        2 g 200 mL/hr over 30 Minutes Intravenous 30 min pre-op 08/16/22 1749                Family Communication/Anticipated D/C date and plan/Code Status   DVT prophylaxis: SCDs Start: 08/16/22  2302     Code Status: DNR  Family Communication: None Disposition Plan: Plan to discharge to SNF in 1 to 2 days   Status is: Inpatient Remains inpatient appropriate because: Awaiting placement to SNF       Subjective:   Interval events noted.  He is confused and cannot provide any history.  Objective:    Vitals:   08/19/22 2308 08/20/22 0824 08/20/22 1611 08/20/22 2100  BP: (!) 138/52 (!) 132/57 (!) 155/71 (!) 154/63  Pulse: 89 85 91 98  Resp: 20 16 18 20   Temp: 98.4 F (36.9 C) 98.7 F (37.1 C) 99.8 F (37.7 C) 98.6 F (37 C)  TempSrc:    Axillary  SpO2: 100% 100% 100% 98%  Weight:      Height:       No data found.   Intake/Output Summary (Last 24 hours) at 08/21/2022 1336 Last data filed at 08/21/2022 7353 Gross per 24 hour   Intake --  Output 550 ml  Net -550 ml   Filed Weights   08/15/22 2007  Weight: 55 kg    Exam:  GEN: NAD, mittens on bilateral hands SKIN: Warm and dry EYES: EOMI ENT: MMM CV: RRR, systolic murmur loudest in the aortic area PULM: CTA B ABD: soft, ND, NT, +BS CNS: Alert, disoriented EXT: Dressing on right hip surgical wound is dry and intact PSYCH: Confused, poor insight and judgment     Data Reviewed:   I have personally reviewed following labs and imaging studies:  Labs: Labs show the following:   Basic Metabolic Panel: Recent Labs  Lab 08/15/22 2010 08/16/22 0602  NA 138 136  K 3.8 3.8  CL 103 106  CO2 25 27  GLUCOSE 211* 154*  BUN 28* 27*  CREATININE 1.06 0.94  CALCIUM 8.2* 7.9*   GFR Estimated Creatinine Clearance: 34.1 mL/min (by C-G formula based on SCr of 0.94 mg/dL). Liver Function Tests: No results for input(s): "AST", "ALT", "ALKPHOS", "BILITOT", "PROT", "ALBUMIN" in the last 168 hours. No results for input(s): "LIPASE", "AMYLASE" in the last 168 hours. No results for input(s): "AMMONIA" in the last 168 hours. Coagulation profile No results for input(s): "INR", "PROTIME" in the last 168 hours.  CBC: Recent Labs  Lab 08/15/22 2010 08/16/22 0602 08/17/22 0818 08/18/22 0231 08/19/22 1725  WBC 6.0 7.2 6.9 8.8 9.1  NEUTROABS  --  5.6  --  6.6 7.4  HGB 10.2* 9.8* 9.5* 8.8* 9.1*  HCT 33.0* 30.5* 29.2* 27.3* 28.6*  MCV 92.7 89.2 88.0 90.4 90.5  PLT 297 261 247 237 288   Cardiac Enzymes: No results for input(s): "CKTOTAL", "CKMB", "CKMBINDEX", "TROPONINI" in the last 168 hours. BNP (last 3 results) No results for input(s): "PROBNP" in the last 8760 hours. CBG: Recent Labs  Lab 08/19/22 2155 08/20/22 0730 08/20/22 1207 08/20/22 1628 08/20/22 2117  GLUCAP 157* 118* 120* 132* 140*   D-Dimer: No results for input(s): "DDIMER" in the last 72 hours. Hgb A1c: No results for input(s): "HGBA1C" in the last 72 hours. Lipid Profile: No  results for input(s): "CHOL", "HDL", "LDLCALC", "TRIG", "CHOLHDL", "LDLDIRECT" in the last 72 hours. Thyroid function studies: No results for input(s): "TSH", "T4TOTAL", "T3FREE", "THYROIDAB" in the last 72 hours.  Invalid input(s): "FREET3" Anemia work up: No results for input(s): "VITAMINB12", "FOLATE", "FERRITIN", "TIBC", "IRON", "RETICCTPCT" in the last 72 hours. Sepsis Labs: Recent Labs  Lab 08/16/22 0602 08/17/22 0818 08/18/22 0231 08/19/22 1725  WBC 7.2 6.9 8.8 9.1    Microbiology  Recent Results (from the past 240 hour(s))  Surgical PCR screen     Status: None   Collection Time: 08/16/22  3:20 PM   Specimen: Nasal Mucosa; Nasal Swab  Result Value Ref Range Status   MRSA, PCR NEGATIVE NEGATIVE Final   Staphylococcus aureus NEGATIVE NEGATIVE Final    Comment: (NOTE) The Xpert SA Assay (FDA approved for NASAL specimens in patients 35 years of age and older), is one component of a comprehensive surveillance program. It is not intended to diagnose infection nor to guide or monitor treatment. Performed at [facility], [address] [place], [place] [address]     Procedures and diagnostic studies:  No results found.             LOS: 6 days   Baneza Bartoszek  [facility]   Pager on [email or web address]. If 7PM-7AM, please contact night-coverage at [email or web address]     08/21/2022, 1:36 PM

## 2022-08-21 NOTE — TOC Progression Note (Addendum)
Transition of Care [facility]) - Progression Note    Patient Details  Name: Jordan [name] MRN: [id number] Date of Birth: [birth year]  Transition of Care [facility]) CM/SW [place], RN Phone Number: 08/21/2022, 1:26 PM  Clinical Narrative:    Daughter [name] called me and provided [facility] as choice, I notified Tonya art [place], Ins pending ref number [id number]   Expected Discharge Plan:  (unknown as of right now) Barriers to Discharge: Continued Medical Work up  Expected Discharge Plan and Services       Living arrangements for the past 2 months: [place] Determinants of Health (SDOH) Interventions [place]: No Food Insecurity (08/16/2022)  Housing: Low Risk  (08/16/2022)  Transportation Needs: No Transportation Needs (08/16/2022)  Utilities: Not At Risk (08/16/2022)  Tobacco Use: Medium Risk (08/17/2022)    Readmission Risk Interventions    07/18/2022    2:18 PM 07/14/2022   11:46 AM 07/11/2022   12:11 PM  Readmission Risk Prevention Plan  Transportation Screening  Complete Complete  PCP or Specialist Appt within 3-5 Days  Complete Complete  HRI or [place]  Complete Complete  Social Work Consult for [place] Planning/Counseling  Complete Complete  Palliative Care Screening  Not Applicable Not Applicable  Medication Review Press photographer)  Complete Complete  PCP or Specialist appointment within 3-5 days of discharge Complete    HRI or [place] Complete    SW Recovery Care/Counseling Consult Complete    [place] Not Applicable

## 2022-08-22 ENCOUNTER — Other Ambulatory Visit: Payer: Self-pay

## 2022-08-22 DIAGNOSIS — W19XXXD Unspecified fall, subsequent encounter: Secondary | ICD-10-CM | POA: Diagnosis present

## 2022-08-22 DIAGNOSIS — A4150 Gram-negative sepsis, unspecified: Secondary | ICD-10-CM | POA: Diagnosis not present

## 2022-08-22 DIAGNOSIS — F03918 Unspecified dementia, unspecified severity, with other behavioral disturbance: Secondary | ICD-10-CM | POA: Diagnosis not present

## 2022-08-22 DIAGNOSIS — S72009A Fracture of unspecified part of neck of unspecified femur, initial encounter for closed fracture: Secondary | ICD-10-CM | POA: Diagnosis not present

## 2022-08-22 DIAGNOSIS — I10 Essential (primary) hypertension: Secondary | ICD-10-CM | POA: Diagnosis not present

## 2022-08-22 DIAGNOSIS — Z7401 Bed confinement status: Secondary | ICD-10-CM | POA: Diagnosis not present

## 2022-08-22 DIAGNOSIS — E440 Moderate protein-calorie malnutrition: Secondary | ICD-10-CM | POA: Diagnosis not present

## 2022-08-22 DIAGNOSIS — Z66 Do not resuscitate: Secondary | ICD-10-CM | POA: Diagnosis not present

## 2022-08-22 DIAGNOSIS — F05 Delirium due to known physiological condition: Secondary | ICD-10-CM | POA: Diagnosis not present

## 2022-08-22 DIAGNOSIS — H548 Legal blindness, as defined in USA: Secondary | ICD-10-CM | POA: Diagnosis not present

## 2022-08-22 DIAGNOSIS — E785 Hyperlipidemia, unspecified: Secondary | ICD-10-CM | POA: Diagnosis not present

## 2022-08-22 DIAGNOSIS — Z7982 Long term (current) use of aspirin: Secondary | ICD-10-CM | POA: Diagnosis not present

## 2022-08-22 DIAGNOSIS — N400 Enlarged prostate without lower urinary tract symptoms: Secondary | ICD-10-CM | POA: Diagnosis not present

## 2022-08-22 DIAGNOSIS — N401 Enlarged prostate with lower urinary tract symptoms: Secondary | ICD-10-CM | POA: Diagnosis present

## 2022-08-22 DIAGNOSIS — E860 Dehydration: Secondary | ICD-10-CM | POA: Diagnosis not present

## 2022-08-22 DIAGNOSIS — K5730 Diverticulosis of large intestine without perforation or abscess without bleeding: Secondary | ICD-10-CM | POA: Diagnosis present

## 2022-08-22 DIAGNOSIS — I6381 Other cerebral infarction due to occlusion or stenosis of small artery: Secondary | ICD-10-CM | POA: Diagnosis not present

## 2022-08-22 DIAGNOSIS — Z043 Encounter for examination and observation following other accident: Secondary | ICD-10-CM | POA: Diagnosis not present

## 2022-08-22 DIAGNOSIS — H9190 Unspecified hearing loss, unspecified ear: Secondary | ICD-10-CM | POA: Diagnosis present

## 2022-08-22 DIAGNOSIS — I2510 Atherosclerotic heart disease of native coronary artery without angina pectoris: Secondary | ICD-10-CM | POA: Diagnosis not present

## 2022-08-22 DIAGNOSIS — Z8249 Family history of ischemic heart disease and other diseases of the circulatory system: Secondary | ICD-10-CM | POA: Diagnosis not present

## 2022-08-22 DIAGNOSIS — R55 Syncope and collapse: Secondary | ICD-10-CM | POA: Diagnosis not present

## 2022-08-22 DIAGNOSIS — S72141D Displaced intertrochanteric fracture of right femur, subsequent encounter for closed fracture with routine healing: Secondary | ICD-10-CM | POA: Diagnosis not present

## 2022-08-22 DIAGNOSIS — I5032 Chronic diastolic (congestive) heart failure: Secondary | ICD-10-CM | POA: Diagnosis not present

## 2022-08-22 DIAGNOSIS — A4159 Other Gram-negative sepsis: Secondary | ICD-10-CM | POA: Diagnosis not present

## 2022-08-22 DIAGNOSIS — I959 Hypotension, unspecified: Secondary | ICD-10-CM | POA: Diagnosis not present

## 2022-08-22 DIAGNOSIS — R569 Unspecified convulsions: Secondary | ICD-10-CM | POA: Diagnosis not present

## 2022-08-22 DIAGNOSIS — R0902 Hypoxemia: Secondary | ICD-10-CM | POA: Diagnosis not present

## 2022-08-22 DIAGNOSIS — K52832 Lymphocytic colitis: Secondary | ICD-10-CM | POA: Diagnosis not present

## 2022-08-22 DIAGNOSIS — I110 Hypertensive heart disease with heart failure: Secondary | ICD-10-CM | POA: Diagnosis not present

## 2022-08-22 DIAGNOSIS — K5732 Diverticulitis of large intestine without perforation or abscess without bleeding: Secondary | ICD-10-CM | POA: Diagnosis not present

## 2022-08-22 DIAGNOSIS — D500 Iron deficiency anemia secondary to blood loss (chronic): Secondary | ICD-10-CM | POA: Diagnosis not present

## 2022-08-22 DIAGNOSIS — Z681 Body mass index (BMI) 19 or less, adult: Secondary | ICD-10-CM | POA: Diagnosis not present

## 2022-08-22 DIAGNOSIS — Z7984 Long term (current) use of oral hypoglycemic drugs: Secondary | ICD-10-CM | POA: Diagnosis not present

## 2022-08-22 DIAGNOSIS — Z96641 Presence of right artificial hip joint: Secondary | ICD-10-CM | POA: Diagnosis not present

## 2022-08-22 DIAGNOSIS — R5383 Other fatigue: Secondary | ICD-10-CM | POA: Diagnosis not present

## 2022-08-22 DIAGNOSIS — R5381 Other malaise: Secondary | ICD-10-CM | POA: Diagnosis not present

## 2022-08-22 DIAGNOSIS — N390 Urinary tract infection, site not specified: Secondary | ICD-10-CM | POA: Diagnosis not present

## 2022-08-22 DIAGNOSIS — E118 Type 2 diabetes mellitus with unspecified complications: Secondary | ICD-10-CM | POA: Diagnosis not present

## 2022-08-22 DIAGNOSIS — S72144A Nondisplaced intertrochanteric fracture of right femur, initial encounter for closed fracture: Secondary | ICD-10-CM | POA: Diagnosis not present

## 2022-08-22 DIAGNOSIS — F03911 Unspecified dementia, unspecified severity, with agitation: Secondary | ICD-10-CM | POA: Diagnosis not present

## 2022-08-22 DIAGNOSIS — Z4789 Encounter for other orthopedic aftercare: Secondary | ICD-10-CM | POA: Diagnosis not present

## 2022-08-22 DIAGNOSIS — S7290XD Unspecified fracture of unspecified femur, subsequent encounter for closed fracture with routine healing: Secondary | ICD-10-CM | POA: Diagnosis not present

## 2022-08-22 DIAGNOSIS — N138 Other obstructive and reflux uropathy: Secondary | ICD-10-CM | POA: Diagnosis not present

## 2022-08-22 DIAGNOSIS — R41841 Cognitive communication deficit: Secondary | ICD-10-CM | POA: Diagnosis not present

## 2022-08-22 DIAGNOSIS — M6281 Muscle weakness (generalized): Secondary | ICD-10-CM | POA: Diagnosis not present

## 2022-08-22 LAB — GLUCOSE, CAPILLARY
Glucose-Capillary: 73 mg/dL (ref 70–99)
Glucose-Capillary: 218 mg/dL — ABNORMAL HIGH (ref 70–99)

## 2022-08-22 MED ORDER — ASPIRIN 81 MG PO CHEW: 81.0000 mg | CHEWABLE_TABLET | Freq: Two times a day (BID) | ORAL | 0 refills | Status: AC

## 2022-08-22 MED ORDER — SENNA 8.6 MG PO TABS: 1.0000 | ORAL_TABLET | Freq: Two times a day (BID) | ORAL | 0 refills | Status: DC

## 2022-08-22 MED ORDER — ASPIRIN 81 MG PO TBEC
81.0000 mg | DELAYED_RELEASE_TABLET | Freq: Every day | ORAL | Status: DC
  Administered 2022-08-22: 81 mg via ORAL
  Filled 2022-08-22: qty 1

## 2022-08-22 NOTE — Plan of Care (Signed)

## 2022-08-22 NOTE — Plan of Care (Signed)

## 2022-08-22 NOTE — Progress Notes (Signed)
Physical Therapy Treatment Patient Details Name: Jordan [name] MRN: [id number] DOB: 03-16-1923 Today's Date: 08/22/2022   History of Present Illness 89 y.o. male with medical history significant of dementia, CAD s/p DES to RCA (2009), HFpEF, hypertension, hyperlipidemia, type 2 diabetes, severe aortic stenosis, lymphocytic colitis, BPH who presented to the ED after a fall.  Found to have R hip fx with subsequent IM nailing 08/16/22.    PT Comments    Pt in bed, appears asleep but does open eyes briefly.  BLE PROM x 10 where pt resists most exercises but does allow increased ROM at times.  RN in room to give meds.  +2 total to boost up in bed and despite ROM and movement he does not open eyes or mouth to take meds.  He does keep both arms tightly crossed on his chest and resists all attempts and BUE ROM.  Further mobility is deferred for pt and staff safety as he shows resistance to any attempted interventions.     Recommendations for follow up therapy are one component of a multi-disciplinary discharge planning process, led by the attending physician.  Recommendations may be updated based on patient status, additional functional criteria and insurance authorization.  Follow Up Recommendations  Skilled nursing-short term rehab (<3 hours/day)     Assistance Recommended at Discharge Frequent or constant Supervision/Assistance  Patient can return home with the following Assistance with cooking/housework;Assistance with feeding;Direct supervision/assist for medications management;Direct supervision/assist for financial management;Assist for transportation;Help with stairs or ramp for entrance;Two people to help with walking and/or transfers;Two people to help with bathing/dressing/bathroom   Equipment Recommendations       Recommendations for Other Services       Precautions / Restrictions Precautions Precautions: Fall Precaution Comments: Per chart, pt is legally  blind Restrictions Weight Bearing Restrictions: Yes RLE Weight Bearing: Weight bearing as tolerated     Mobility  Bed Mobility                    Transfers                        Ambulation/Gait                   Stairs             Wheelchair Mobility    Modified Rankin (Stroke Patients Only)       Balance                                            Cognition Arousal/Alertness: Lethargic Behavior During Therapy: Flat affect Overall Cognitive Status: Impaired/Different from baseline                                 General Comments: asleep only opening eyes briefly and resisting all activity.        Exercises Other Exercises Other Exercises: BLE PROM - pt resisting most movements but does allow increased ROM at times.    General Comments        Pertinent Vitals/Pain Pain Assessment Pain Assessment: No/denies pain Pain Location: appears comfortable today Pain Intervention(s): Monitored during session    Home Living  Prior Function            PT Goals (current goals can now be found in the care plan section) Progress towards PT goals: Progressing toward goals    Frequency    7X/week      PT Plan Current plan remains appropriate    Co-evaluation              AM-PAC PT "6 Clicks" Mobility   Outcome Measure  Help needed turning from your back to your side while in a flat bed without using bedrails?: Total Help needed moving from lying on your back to sitting on the side of a flat bed without using bedrails?: Total Help needed moving to and from a bed to a chair (including a wheelchair)?: Total Help needed standing up from a chair using your arms (e.g., wheelchair or bedside chair)?: Total Help needed to walk in hospital room?: Total Help needed climbing 3-5 steps with a railing? : Total 6 Click Score: 6    End of Session   Activity  Tolerance: Patient limited by lethargy;Other (comment) Patient left: in bed;with call bell/phone within reach;with bed alarm set Nurse Communication: Mobility status PT Visit Diagnosis: Unsteadiness on feet (R26.81);Other abnormalities of gait and mobility (R26.89);Muscle weakness (generalized) (M62.81)     Time: 3254-9826 PT Time Calculation (min) (ACUTE ONLY): 8 min  Charges:  $Therapeutic Exercise: 8-22 mins          Chesley Noon, PTA 08/22/22, 9:09 AM

## 2022-08-22 NOTE — Discharge Summary (Addendum)
Physician Discharge Summary   Patient: Jordan [name] MRN: [id number] DOB: Jul 16, 1923  Admit date:     08/15/2022  Discharge date: 08/22/22  Discharge Physician: Jennye Boroughs   PCP: Asencion Noble, MD   Recommendations at discharge:   Follow-up with your physician in the next home within 3 days of discharge Follow-up with orthopedic surgeon, Dr. Harlow Mares, in 2 weeks  Discharge Diagnoses: Principal Problem:   Femur fracture ([place]) Active Problems:   Dementia with behavioral disturbance ([facility])   Chronic diastolic CHF (congestive heart failure) ([place])   Type 2 diabetes mellitus with complication ([place])   Hypertension   Malnutrition of moderate degree  Resolved Problems:   * No resolved hospital problems. [facility] Course:  Jordan [name] is a 89 y.o. male with medical history significant of dementia, CAD s/p DES to RCA (2009), HFpEF, hypertension, hyperlipidemia, type 2 diabetes, severe aortic stenosis, lymphocytic colitis, BPH, who presented from [facility] home to the hospital after a fall.   Workup revealed right hip fracture.  He was treated with analgesics.  He underwent right intramedullary nail intertrochanteric on 08/16/2022.      Assessment and Plan:  Mildly displaced intertrochanteric comminuted right hip fracture, s/p fall: S/p right intramedullary nail intertrochanteric on 08/16/2022.  PT and OT recommended discharge to SNF for rehab.  Follow-up with orthopedic surgeon in 2 weeks.  Tobacco is, orthopedic surgeon recommended DVT prophylaxis with low-dose aspirin 81 mg twice daily for 6 weeks.  Patient can resume daily baby aspirin thereafter. Discussed via secure chat     Acute blood loss anemia from hip surgery: H&H is stable.  No indication for blood transfusion at this time.       Type II DM with mild hyperglycemia; hypoglycemia on 08/16/2022.  Glucose levels are optimal.  Continue carb modified diet.  Hemoglobin A1c was 7.2 on 06/22/2022.   Resume metformin at discharge    Dementia with behavioral disturbance, visual hallucinations: Continue memantine and Seroquel.     Chronic diastolic CHF, severe aortic stenosis: Compensated.  2D echo in October 2023 showed EF estimated at 60 to 65%, moderate LVH, grade 1 diastolic dysfunction, moderate aortic regurgitation, moderate to severe aortic stenosis     Other comorbidities include hypertension on metoprolol, lymphocytic colitis on budesonide           Consultants: Orthopedic surgeon Procedures performed: IM nail for intertrochanteric right hip Disposition: Skilled nursing facility Diet recommendation:  Cardiac diet DISCHARGE MEDICATION: Allergies as of 08/22/2022   No Known Allergies      Medication List     TAKE these medications    aspirin 81 MG chewable tablet Chew 1 tablet (81 mg total) by mouth 2 (two) times daily. (0800) What changed: when to take this   budesonide 3 MG 24 hr capsule Commonly known as: ENTOCORT EC TAKE 1 CAPSULE BY MOUTH DAILY. What changed:  when to take this additional instructions   feeding supplement Liqd Take 237 mLs by mouth 3 (three) times daily after meals.   finasteride 5 MG tablet Commonly known as: PROSCAR TAKE 1 TABLET BY MOUTH DAILY AS DIRECTED.   memantine 10 MG tablet Commonly known as: NAMENDA Take 10 mg by mouth 2 (two) times daily. (0800, 2000)   metFORMIN 500 MG tablet Commonly known as: GLUCOPHAGE Take 500 mg by mouth at bedtime. (2100)   metoprolol tartrate 25 MG tablet Commonly known as: LOPRESSOR Take 0.5 tablets (12.5 mg total) by mouth 2 (two) times daily.  What changed: additional instructions   mirabegron ER 25 MG Tb24 tablet Commonly known as: MYRBETRIQ Take 1 tablet (25 mg total) by mouth daily. What changed:  when to take this additional instructions   pravastatin 80 MG tablet Commonly known as: PRAVACHOL Take 80 mg by mouth daily at 6 PM.   PreserVision AREDS Tabs Take 1 tablet by  mouth in the morning. (0800)   QUEtiapine 25 MG tablet Commonly known as: SEROQUEL Take 3 tablets (75 mg total) by mouth at bedtime. What changed: additional instructions   senna 8.6 MG Tabs tablet Commonly known as: SENOKOT Take 1 tablet (8.6 mg total) by mouth 2 (two) times daily.        Contact information for follow-up providers     Lovell Sheehan, MD. Schedule an appointment as soon as possible for a visit in 2 week(s).   Specialty: Orthopedic Surgery Contact information: [place] [address] [phone]              Contact information for after-discharge care     [facility] .   Service: Skilled Nursing Contact information: [place] [phone]                    Discharge Exam: Danley Danker Weights   08/15/22 2007  Weight: 55 kg   GEN: NAD SKIN: Warm and dry EYES: No pallor ENT: MMM CV: RRR PULM: CTA B ABD: soft, ND, NT, +BS CNS: Alert, confused, non focal EXT: Mild right hip swelling.  Dressing on right hip surgical wound is clean, dry and intact   Condition at discharge: good  The results of significant diagnostics from this hospitalization (including imaging, microbiology, ancillary and laboratory) are listed below for reference.   Imaging Studies: DG C-Arm 1-60 Min-No Report  Result Date: 08/16/2022 CLINICAL DATA:  ORIF right hip EXAM: DG HIP (WITH OR WITHOUT PELVIS) 2-3V RIGHT; DG C-ARM 1-60 MIN-NO REPORT COMPARISON:  Right hip radiographs dated 08/15/2022 Fluoroscopy time: 39 seconds FINDINGS: Intraoperative fluoroscopic images during IM nail with dynamic hip screw fixation of an intertrochanteric right hip fracture. Fracture fragments are in near anatomic alignment and position, noting a minimally displaced lesser trochanteric fragment. IMPRESSION: Intraoperative fluoroscopic images during ORIF of an intertrochanteric right hip  fracture, as above. Electronically Signed   By: Julian Hy M.D.   On: 08/16/2022 21:48   DG HIP UNILAT WITH PELVIS 2-3 VIEWS RIGHT  Result Date: 08/16/2022 CLINICAL DATA:  ORIF right hip EXAM: DG HIP (WITH OR WITHOUT PELVIS) 2-3V RIGHT; DG C-ARM 1-60 MIN-NO REPORT COMPARISON:  Right hip radiographs dated 08/15/2022 Fluoroscopy time: 39 seconds FINDINGS: Intraoperative fluoroscopic images during IM nail with dynamic hip screw fixation of an intertrochanteric right hip fracture. Fracture fragments are in near anatomic alignment and position, noting a minimally displaced lesser trochanteric fragment. IMPRESSION: Intraoperative fluoroscopic images during ORIF of an intertrochanteric right hip fracture, as above. Electronically Signed   By: Julian Hy M.D.   On: 08/16/2022 21:48   DG Chest 1 View  Result Date: 08/15/2022 CLINICAL DATA:  Fall. EXAM: CHEST  1 VIEW COMPARISON:  07/08/2022 FINDINGS: Low lung volumes.The cardiomediastinal contours are normal. The lungs are clear. Pulmonary vasculature is normal. No consolidation, pleural effusion, or pneumothorax. No acute osseous abnormalities are seen. IMPRESSION: Low lung volumes without acute abnormality. Electronically Signed   By: Keith Rake M.D.   On: 08/15/2022 20:46  DG Hip Unilat W or Wo Pelvis 2-3 Views Right  Result Date: 08/15/2022 CLINICAL DATA:  Pain after fall EXAM: DG HIP (WITH OR WITHOUT PELVIS) 2 V RIGHT COMPARISON:  None Available. FINDINGS: Mildly displaced intertrochanteric hip fracture on the right. Comminuted. No additional fracture or dislocation. Preserved joint spaces. Only mild degenerative changes of the sacroiliac joints. Hyperostosis. Osteopenia. Scattered vascular calcifications. Degenerative changes seen of the lower lumbar spine at the edge of the imaging field. IMPRESSION: Mildly displaced intertrochanteric comminuted right hip fracture Electronically Signed   By: Jill Side M.D.   On: 08/15/2022 20:45    CT Cervical Spine Wo Contrast  Result Date: 08/11/2022 CLINICAL DATA:  Fall.  Hit head. EXAM: CT CERVICAL SPINE WITHOUT CONTRAST TECHNIQUE: Multidetector CT imaging of the cervical spine was performed without intravenous contrast. Multiplanar CT image reconstructions were also generated. RADIATION DOSE REDUCTION: This exam was performed according to the departmental dose-optimization program which includes automated exposure control, adjustment of the mA and/or kV according to patient size and/or use of iterative reconstruction technique. COMPARISON:  CT cervical spine 07/21/2022 FINDINGS: Alignment: There is 2 mm grade 1 anterolisthesis of C2 on C3, 4 mm grade 1 anterolisthesis of C4 on C5, and 2 mm grade 1 anterolisthesis of C7 on T1, not significantly changed. Mild levocurvature centered at C5-6. Skull base and vertebrae: The atlantodens interval is intact. The facet joints are appropriately aligned. Vertebral body heights are maintained. Moderate to severe C4-5 and C5-6 moderate C6-7, and mild right C7-T1 disc space narrowing. Large anterior bridging osteophytes from C4-5 through T1-2. No acute fracture is seen. Soft tissues and spinal canal: No prevertebral fluid or swelling. No visible canal hematoma. Disc levels: Multilevel disc space narrowing, uncovertebral hypertrophy, and facet joint hypertrophy contribute to moderate to severe right and moderate left C2-3, severe right and moderate left C3-4, severe right and moderate left C4-5, moderate to severe left-greater-than-right C5-6, severe left and moderate right C6-7, and mild right C7-T1 neuroforaminal narrowing. Mild-to-moderate C5-6 central canal stenosis. Upper chest: There is scarring is seen within the bilateral posterior lung apices, partially calcified. Other: No cervical chain lymphadenopathy. IMPRESSION: 1. No acute fracture. 2. Multilevel degenerative disc and joint changes as above. Electronically Signed   By: Yvonne Kendall M.D.   On:  08/11/2022 16:34   CT HEAD WO CONTRAST (5MM)  Result Date: 08/11/2022 CLINICAL DATA:  Fall. Hit head. No loss of consciousness. Not on blood thinners. EXAM: CT HEAD WITHOUT CONTRAST TECHNIQUE: Contiguous axial images were obtained from the base of the skull through the vertex without intravenous contrast. RADIATION DOSE REDUCTION: This exam was performed according to the departmental dose-optimization program which includes automated exposure control, adjustment of the mA and/or kV according to patient size and/or use of iterative reconstruction technique. COMPARISON:  CT brain 07/21/2022 FINDINGS: Brain: There is moderate cortical atrophy, unchanged from prior within normal limits for patient age. The ventricles are normal in configuration. The basilar cisterns are patent. No mass, mass effect, or midline shift. No acute intracranial hemorrhage is seen. No abnormal extra-axial fluid collection. Mild-to-moderate patchy periventricular white matter hypodensities, likely chronic ischemic white matter changes, not significantly changed. Preservation of the normal cortical gray-white interface without CT evidence of an acute major vascular territorial cortical based infarction. Vascular: There are atherosclerotic calcifications within the bilateral carotid siphons. Skull: Normal. Negative for fracture or focal lesion. Sinuses/Orbits: Postsurgical changes of bilateral ocular lens replacements are again noted. Mild posterior left ethmoid air cell mucosal opacification is unchanged. Mild-to-moderate  left sphenoid sinus mucosal opacification and bubbly lucencies are similar to prior. The visualized mastoid air cells are clear. Other: None. IMPRESSION: 1. No acute intracranial hemorrhage or calvarial fracture. 2. Stable mild-to-moderate periventricular white matter hypodensities, likely chronic ischemic white matter changes. Electronically Signed   By: Yvonne Kendall M.D.   On: 08/11/2022 16:23    Microbiology: Results  for orders placed or performed during the hospital encounter of 08/15/22  Surgical PCR screen     Status: None   Collection Time: 08/16/22  3:20 PM   Specimen: Nasal Mucosa; Nasal Swab  Result Value Ref Range Status   MRSA, PCR NEGATIVE NEGATIVE Final   Staphylococcus aureus NEGATIVE NEGATIVE Final    Comment: (NOTE) The Xpert SA Assay (FDA approved for NASAL specimens in patients 26 years of age and older), is one component of a comprehensive surveillance program. It is not intended to diagnose infection nor to guide or monitor treatment. Performed at [facility], [address] [place], [place] [address]     Labs: CBC: Recent Labs  Lab 08/15/22 2010 08/16/22 0602 08/17/22 0818 08/18/22 0231 08/19/22 1725  WBC 6.0 7.2 6.9 8.8 9.1  NEUTROABS  --  5.6  --  6.6 7.4  HGB 10.2* 9.8* 9.5* 8.8* 9.1*  HCT 33.0* 30.5* 29.2* 27.3* 28.6*  MCV 92.7 89.2 88.0 90.4 90.5  PLT 297 261 247 237 841   Basic Metabolic Panel: Recent Labs  Lab 08/15/22 2010 08/16/22 0602  NA 138 136  K 3.8 3.8  CL 103 106  CO2 25 27  GLUCOSE 211* 154*  BUN 28* 27*  CREATININE 1.06 0.94  CALCIUM 8.2* 7.9*   Liver Function Tests: No results for input(s): "AST", "ALT", "ALKPHOS", "BILITOT", "PROT", "ALBUMIN" in the last 168 hours. CBG: Recent Labs  Lab 08/20/22 1207 08/20/22 1628 08/20/22 2117 08/21/22 2356 08/22/22 0729  GLUCAP 120* 132* 140* 93 73    Discharge time spent: greater than 30 minutes.  Signed: Jennye Boroughs, MD [facility] 08/22/2022

## 2022-08-22 NOTE — Plan of Care (Signed)
Problem: Education: Goal: Ability to describe self-care measures that may prevent or decrease complications (Diabetes Survival Skills Education) will improve 08/22/2022 1138 by Alferd Apa, RN Outcome: Adequate for Discharge 08/22/2022 0753 by Alferd Apa, RN Outcome: Progressing Goal: Individualized Educational Video(s) 08/22/2022 1138 by Alferd Apa, RN Outcome: Adequate for Discharge 08/22/2022 0753 by Alferd Apa, RN Outcome: Progressing   Problem: Coping: Goal: Ability to adjust to condition or change in health will improve 08/22/2022 1138 by Alferd Apa, RN Outcome: Adequate for Discharge 08/22/2022 0753 by Alferd Apa, RN Outcome: Progressing   Problem: Fluid Volume: Goal: Ability to maintain a balanced intake and output will improve 08/22/2022 1138 by Alferd Apa, RN Outcome: Adequate for Discharge 08/22/2022 0753 by Alferd Apa, RN Outcome: Progressing   Problem: Health Behavior/Discharge Planning: Goal: Ability to identify and utilize available resources and services will improve 08/22/2022 1138 by Alferd Apa, RN Outcome: Adequate for Discharge 08/22/2022 0753 by Alferd Apa, RN Outcome: Progressing Goal: Ability to manage health-related needs will improve 08/22/2022 1138 by Alferd Apa, RN Outcome: Adequate for Discharge 08/22/2022 0753 by Alferd Apa, RN Outcome: Progressing   Problem: Metabolic: Goal: Ability to maintain appropriate glucose levels will improve 08/22/2022 1138 by Alferd Apa, RN Outcome: Adequate for Discharge 08/22/2022 0753 by Alferd Apa, RN Outcome: Progressing   Problem: Nutritional: Goal: Maintenance of adequate nutrition will improve 08/22/2022 1138 by Alferd Apa, RN Outcome: Adequate for Discharge 08/22/2022 0753 by Alferd Apa, RN Outcome: Progressing Goal: Progress toward achieving an optimal weight will improve 08/22/2022 1138 by Alferd Apa, RN Outcome: Adequate for Discharge 08/22/2022 0753 by Alferd Apa,  RN Outcome: Progressing   Problem: Skin Integrity: Goal: Risk for impaired skin integrity will decrease 08/22/2022 1138 by Alferd Apa, RN Outcome: Adequate for Discharge 08/22/2022 0753 by Alferd Apa, RN Outcome: Progressing   Problem: Tissue Perfusion: Goal: Adequacy of tissue perfusion will improve 08/22/2022 1138 by Alferd Apa, RN Outcome: Adequate for Discharge 08/22/2022 0753 by Alferd Apa, RN Outcome: Progressing   Problem: Education: Goal: Knowledge of General Education information will improve Description: Including pain rating scale, medication(s)/side effects and non-pharmacologic comfort measures 08/22/2022 1138 by Alferd Apa, RN Outcome: Adequate for Discharge 08/22/2022 0753 by Alferd Apa, RN Outcome: Progressing   Problem: Health Behavior/Discharge Planning: Goal: Ability to manage health-related needs will improve 08/22/2022 1138 by Alferd Apa, RN Outcome: Adequate for Discharge 08/22/2022 0753 by Alferd Apa, RN Outcome: Progressing   Problem: Clinical Measurements: Goal: Ability to maintain clinical measurements within normal limits will improve 08/22/2022 1138 by Alferd Apa, RN Outcome: Adequate for Discharge 08/22/2022 0753 by Alferd Apa, RN Outcome: Progressing Goal: Will remain free from infection 08/22/2022 1138 by Alferd Apa, RN Outcome: Adequate for Discharge 08/22/2022 0753 by Alferd Apa, RN Outcome: Progressing Goal: Diagnostic test results will improve 08/22/2022 1138 by Alferd Apa, RN Outcome: Adequate for Discharge 08/22/2022 0753 by Alferd Apa, RN Outcome: Progressing Goal: Respiratory complications will improve 08/22/2022 1138 by Alferd Apa, RN Outcome: Adequate for Discharge 08/22/2022 0753 by Alferd Apa, RN Outcome: Progressing Goal: Cardiovascular complication will be avoided 08/22/2022 1138 by Alferd Apa, RN Outcome: Adequate for Discharge 08/22/2022 0753 by Alferd Apa, RN Outcome: Progressing   Problem:  Activity: Goal: Risk for activity intolerance will decrease 08/22/2022 1138 by Madisin Hasan, Alfredo Batty, RN Outcome: Adequate for Discharge  08/22/2022 0753 by Alferd Apa, RN Outcome: Progressing   Problem: Nutrition: Goal: Adequate nutrition will be maintained 08/22/2022 1138 by Alferd Apa, RN Outcome: Adequate for Discharge 08/22/2022 0753 by Alferd Apa, RN Outcome: Progressing   Problem: Coping: Goal: Level of anxiety will decrease 08/22/2022 1138 by Alferd Apa, RN Outcome: Adequate for Discharge 08/22/2022 0753 by Alferd Apa, RN Outcome: Progressing   Problem: Elimination: Goal: Will not experience complications related to bowel motility 08/22/2022 1138 by Alferd Apa, RN Outcome: Adequate for Discharge 08/22/2022 0753 by Alferd Apa, RN Outcome: Progressing Goal: Will not experience complications related to urinary retention 08/22/2022 1138 by Alferd Apa, RN Outcome: Adequate for Discharge 08/22/2022 0753 by Alferd Apa, RN Outcome: Progressing   Problem: Pain Managment: Goal: General experience of comfort will improve 08/22/2022 1138 by Alferd Apa, RN Outcome: Adequate for Discharge 08/22/2022 0753 by Alferd Apa, RN Outcome: Progressing   Problem: Safety: Goal: Ability to remain free from injury will improve 08/22/2022 1138 by Alferd Apa, RN Outcome: Adequate for Discharge 08/22/2022 0753 by Alferd Apa, RN Outcome: Progressing   Problem: Skin Integrity: Goal: Risk for impaired skin integrity will decrease 08/22/2022 1138 by Alferd Apa, RN Outcome: Adequate for Discharge 08/22/2022 0753 by Alferd Apa, RN Outcome: Progressing

## 2022-08-22 NOTE — TOC Progression Note (Signed)
Transition of Care [facility]) - Progression Note    Patient Details  Name: Jordan [name] MRN: [id number] Date of Birth: 12-17-1922  Transition of Care [facility]) CM/SW [place], RN Phone Number: 08/22/2022, 11:36 AM  Clinical Narrative:     Called daughter [name] and let her know he will go to room 37A at [facility], EMS to [facility] to transport he is 2nd on list   Expected Discharge Plan:  (unknown as of right now) Barriers to Discharge: Continued Medical Work up  Expected Discharge Plan and Services       Living arrangements for the past 2 months: Single Family Home Expected Discharge Date: 08/22/22                                     Social Determinants of Health ([place]) Interventions [place]: No Food Insecurity (08/16/2022)  Housing: Low Risk  (08/16/2022)  Transportation Needs: No Transportation Needs (08/16/2022)  Utilities: Not At Risk (08/16/2022)  Tobacco Use: Medium Risk (08/17/2022)    Readmission Risk Interventions    07/18/2022    2:18 PM 07/14/2022   11:46 AM 07/11/2022   12:11 PM  Readmission Risk Prevention Plan  Transportation Screening  Complete Complete  PCP or Specialist Appt within 3-5 Days  Complete Complete  HRI or [place]  Complete Complete  Social Work Consult for [place] Planning/Counseling  Complete Complete  Palliative Care Screening  Not Applicable Not Applicable  Medication Review Press photographer)  Complete Complete  PCP or Specialist appointment within 3-5 days of discharge Complete    HRI or [place] Complete    SW Recovery Care/Counseling Consult Complete    [place] Not Applicable

## 2022-08-22 NOTE — TOC Progression Note (Signed)
Transition of Care [facility]) - Progression Note    Patient Details  Name: Jordan [name] MRN: [id number] Date of Birth: [birth year]  Transition of Care [facility]) CM/SW [place], RN Phone Number: 08/22/2022, 9:15 AM  Clinical Narrative:    Insurance approved to go to [facility], ref number [id number] 2/6-2/8 I notified Tonya at [facility]   Expected Discharge Plan:  (unknown as of right now) Barriers to Discharge: Continued Medical Work up  Expected Discharge Plan and Services       Living arrangements for the past 2 months: Single Family Home                                       Social Determinants of Health (SDOH) Interventions SDOH Screenings   Food Insecurity: No Food Insecurity (08/16/2022)  Housing: Low Risk  (08/16/2022)  Transportation Needs: No Transportation Needs (08/16/2022)  Utilities: Not At Risk (08/16/2022)  Tobacco Use: Medium Risk (08/17/2022)    Readmission Risk Interventions    07/18/2022    2:18 PM 07/14/2022   11:46 AM 07/11/2022   12:11 PM  Readmission Risk Prevention Plan  Transportation Screening  Complete Complete  PCP or Specialist Appt within 3-5 Days  Complete Complete  HRI or [place]  Complete Complete  Social Work Consult for [place] Planning/Counseling  Complete Complete  Palliative Care Screening  Not Applicable Not Applicable  Medication Review Press photographer)  Complete Complete  PCP or Specialist appointment within 3-5 days of discharge Complete    HRI or [place] Complete    SW Recovery Care/Counseling Consult Complete    [place] Not Applicable

## 2022-08-22 NOTE — TOC Progression Note (Signed)
Transition of Care [facility]) - Progression Note    Patient Details  Name: Jordan [name] MRN: [id number] Date of Birth: 06-02-1923  Transition of Care [facility]) CM/SW [place], RN Phone Number: 08/22/2022, 10:43 AM  Clinical Narrative:     Damaris Schooner to [name] daughter and let her know that he will DC to [facility] today, Ins was approved I will call and let her know the room number once obtained  Expected Discharge Plan:  (unknown as of right now) Barriers to Discharge: Continued Medical Work up  Expected Discharge Plan and Services       Living arrangements for the past 2 months: Single Family Home Expected Discharge Date: 08/22/22                                     Social Determinants of Health ([place]) Interventions SDOH Screenings   Food Insecurity: No Food Insecurity (08/16/2022)  Housing: Low Risk  (08/16/2022)  Transportation Needs: No Transportation Needs (08/16/2022)  Utilities: Not At Risk (08/16/2022)  Tobacco Use: Medium Risk (08/17/2022)    Readmission Risk Interventions    07/18/2022    2:18 PM 07/14/2022   11:46 AM 07/11/2022   12:11 PM  Readmission Risk Prevention Plan  Transportation Screening  Complete Complete  PCP or Specialist Appt within 3-5 Days  Complete Complete  HRI or [place]  Complete Complete  Social Work Consult for [place] Planning/Counseling  Complete Complete  Palliative Care Screening  Not Applicable Not Applicable  Medication Review Press photographer)  Complete Complete  PCP or Specialist appointment within 3-5 days of discharge Complete    HRI or [place] Complete    SW Recovery Care/Counseling Consult Complete    [place] Not Applicable

## 2022-08-22 NOTE — Progress Notes (Signed)
Report called to [employer].

## 2022-08-23 DIAGNOSIS — I10 Essential (primary) hypertension: Secondary | ICD-10-CM | POA: Diagnosis not present

## 2022-08-23 DIAGNOSIS — Z4789 Encounter for other orthopedic aftercare: Secondary | ICD-10-CM | POA: Diagnosis not present

## 2022-08-23 DIAGNOSIS — S72141D Displaced intertrochanteric fracture of right femur, subsequent encounter for closed fracture with routine healing: Secondary | ICD-10-CM | POA: Diagnosis not present

## 2022-08-23 DIAGNOSIS — R5381 Other malaise: Secondary | ICD-10-CM | POA: Diagnosis not present

## 2022-08-23 DIAGNOSIS — K5732 Diverticulitis of large intestine without perforation or abscess without bleeding: Secondary | ICD-10-CM | POA: Diagnosis not present

## 2022-08-24 ENCOUNTER — Ambulatory Visit (INDEPENDENT_AMBULATORY_CARE_PROVIDER_SITE_OTHER): Payer: Medicare Other | Admitting: Gastroenterology

## 2022-08-25 ENCOUNTER — Emergency Department: Payer: Medicare Other

## 2022-08-25 ENCOUNTER — Inpatient Hospital Stay
Admission: EM | Admit: 2022-08-25 | Discharge: 2022-09-07 | DRG: 872 | Disposition: A | Discharge status: Skilled Nursing Facility | Payer: Medicare Other | Attending: Internal Medicine | Admitting: Internal Medicine

## 2022-08-25 ENCOUNTER — Other Ambulatory Visit: Payer: Self-pay

## 2022-08-25 DIAGNOSIS — I5032 Chronic diastolic (congestive) heart failure: Secondary | ICD-10-CM | POA: Diagnosis present

## 2022-08-25 DIAGNOSIS — F03911 Unspecified dementia, unspecified severity, with agitation: Secondary | ICD-10-CM | POA: Diagnosis present

## 2022-08-25 DIAGNOSIS — A4150 Gram-negative sepsis, unspecified: Secondary | ICD-10-CM | POA: Diagnosis present

## 2022-08-25 DIAGNOSIS — A4159 Other Gram-negative sepsis: Principal | ICD-10-CM | POA: Diagnosis present

## 2022-08-25 DIAGNOSIS — F05 Delirium due to known physiological condition: Secondary | ICD-10-CM | POA: Diagnosis not present

## 2022-08-25 DIAGNOSIS — A419 Sepsis, unspecified organism: Secondary | ICD-10-CM | POA: Diagnosis present

## 2022-08-25 DIAGNOSIS — R5383 Other fatigue: Secondary | ICD-10-CM | POA: Diagnosis not present

## 2022-08-25 DIAGNOSIS — F03918 Unspecified dementia, unspecified severity, with other behavioral disturbance: Secondary | ICD-10-CM | POA: Diagnosis present

## 2022-08-25 DIAGNOSIS — Z9841 Cataract extraction status, right eye: Secondary | ICD-10-CM

## 2022-08-25 DIAGNOSIS — E785 Hyperlipidemia, unspecified: Secondary | ICD-10-CM | POA: Diagnosis present

## 2022-08-25 DIAGNOSIS — Z66 Do not resuscitate: Secondary | ICD-10-CM | POA: Diagnosis present

## 2022-08-25 DIAGNOSIS — Z9842 Cataract extraction status, left eye: Secondary | ICD-10-CM

## 2022-08-25 DIAGNOSIS — I10 Essential (primary) hypertension: Secondary | ICD-10-CM | POA: Diagnosis present

## 2022-08-25 DIAGNOSIS — I110 Hypertensive heart disease with heart failure: Secondary | ICD-10-CM | POA: Diagnosis present

## 2022-08-25 DIAGNOSIS — N138 Other obstructive and reflux uropathy: Secondary | ICD-10-CM | POA: Diagnosis present

## 2022-08-25 DIAGNOSIS — I6381 Other cerebral infarction due to occlusion or stenosis of small artery: Secondary | ICD-10-CM | POA: Diagnosis not present

## 2022-08-25 DIAGNOSIS — S72009A Fracture of unspecified part of neck of unspecified femur, initial encounter for closed fracture: Secondary | ICD-10-CM | POA: Diagnosis not present

## 2022-08-25 DIAGNOSIS — R404 Transient alteration of awareness: Secondary | ICD-10-CM | POA: Diagnosis not present

## 2022-08-25 DIAGNOSIS — H548 Legal blindness, as defined in USA: Secondary | ICD-10-CM | POA: Diagnosis present

## 2022-08-25 DIAGNOSIS — Z79899 Other long term (current) drug therapy: Secondary | ICD-10-CM

## 2022-08-25 DIAGNOSIS — S7290XD Unspecified fracture of unspecified femur, subsequent encounter for closed fracture with routine healing: Secondary | ICD-10-CM

## 2022-08-25 DIAGNOSIS — Z7982 Long term (current) use of aspirin: Secondary | ICD-10-CM | POA: Diagnosis not present

## 2022-08-25 DIAGNOSIS — Z955 Presence of coronary angioplasty implant and graft: Secondary | ICD-10-CM

## 2022-08-25 DIAGNOSIS — R0902 Hypoxemia: Secondary | ICD-10-CM | POA: Diagnosis not present

## 2022-08-25 DIAGNOSIS — Z681 Body mass index (BMI) 19 or less, adult: Secondary | ICD-10-CM

## 2022-08-25 DIAGNOSIS — E118 Type 2 diabetes mellitus with unspecified complications: Secondary | ICD-10-CM | POA: Diagnosis not present

## 2022-08-25 DIAGNOSIS — Z961 Presence of intraocular lens: Secondary | ICD-10-CM | POA: Diagnosis present

## 2022-08-25 DIAGNOSIS — Z043 Encounter for examination and observation following other accident: Secondary | ICD-10-CM | POA: Diagnosis not present

## 2022-08-25 DIAGNOSIS — W19XXXD Unspecified fall, subsequent encounter: Secondary | ICD-10-CM | POA: Diagnosis present

## 2022-08-25 DIAGNOSIS — R569 Unspecified convulsions: Secondary | ICD-10-CM | POA: Diagnosis present

## 2022-08-25 DIAGNOSIS — R55 Syncope and collapse: Secondary | ICD-10-CM | POA: Diagnosis not present

## 2022-08-25 DIAGNOSIS — Z7401 Bed confinement status: Secondary | ICD-10-CM | POA: Diagnosis not present

## 2022-08-25 DIAGNOSIS — R5381 Other malaise: Secondary | ICD-10-CM | POA: Diagnosis present

## 2022-08-25 DIAGNOSIS — H9190 Unspecified hearing loss, unspecified ear: Secondary | ICD-10-CM | POA: Diagnosis present

## 2022-08-25 DIAGNOSIS — Z8249 Family history of ischemic heart disease and other diseases of the circulatory system: Secondary | ICD-10-CM

## 2022-08-25 DIAGNOSIS — N401 Enlarged prostate with lower urinary tract symptoms: Secondary | ICD-10-CM | POA: Diagnosis present

## 2022-08-25 DIAGNOSIS — E440 Moderate protein-calorie malnutrition: Secondary | ICD-10-CM | POA: Diagnosis not present

## 2022-08-25 DIAGNOSIS — K5730 Diverticulosis of large intestine without perforation or abscess without bleeding: Secondary | ICD-10-CM | POA: Diagnosis present

## 2022-08-25 DIAGNOSIS — Z7984 Long term (current) use of oral hypoglycemic drugs: Secondary | ICD-10-CM

## 2022-08-25 DIAGNOSIS — S72144A Nondisplaced intertrochanteric fracture of right femur, initial encounter for closed fracture: Secondary | ICD-10-CM | POA: Diagnosis not present

## 2022-08-25 DIAGNOSIS — N390 Urinary tract infection, site not specified: Secondary | ICD-10-CM | POA: Diagnosis not present

## 2022-08-25 DIAGNOSIS — E860 Dehydration: Principal | ICD-10-CM | POA: Diagnosis present

## 2022-08-25 DIAGNOSIS — W19XXXA Unspecified fall, initial encounter: Secondary | ICD-10-CM | POA: Diagnosis not present

## 2022-08-25 DIAGNOSIS — S7290XA Unspecified fracture of unspecified femur, initial encounter for closed fracture: Secondary | ICD-10-CM | POA: Diagnosis present

## 2022-08-25 DIAGNOSIS — Z9181 History of falling: Secondary | ICD-10-CM

## 2022-08-25 DIAGNOSIS — Z79890 Hormone replacement therapy (postmenopausal): Secondary | ICD-10-CM

## 2022-08-25 LAB — CBC WITH DIFFERENTIAL/PLATELET
Abs Immature Granulocytes: 0.03 10*3/uL (ref 0.00–0.07)
Basophils Absolute: 0 10*3/uL (ref 0.0–0.1)
Basophils Relative: 1 %
Eosinophils Absolute: 0.1 10*3/uL (ref 0.0–0.5)
Eosinophils Relative: 1 %
HCT: 32.4 % — ABNORMAL LOW (ref 39.0–52.0)
Hemoglobin: 10 g/dL — ABNORMAL LOW (ref 13.0–17.0)
Immature Granulocytes: 0 %
Lymphocytes Relative: 12 %
Lymphs Abs: 0.9 10*3/uL (ref 0.7–4.0)
MCH: 28.1 pg (ref 26.0–34.0)
MCHC: 30.9 g/dL (ref 30.0–36.0)
MCV: 91 fL (ref 80.0–100.0)
Monocytes Absolute: 0.7 10*3/uL (ref 0.1–1.0)
Monocytes Relative: 9 %
Neutro Abs: 5.9 10*3/uL (ref 1.7–7.7)
Neutrophils Relative %: 77 %
Platelets: 456 10*3/uL — ABNORMAL HIGH (ref 150–400)
RBC: 3.56 MIL/uL — ABNORMAL LOW (ref 4.22–5.81)
RDW: 13.7 % (ref 11.5–15.5)
WBC: 7.6 10*3/uL (ref 4.0–10.5)
nRBC: 0 % (ref 0.0–0.2)

## 2022-08-25 LAB — URINALYSIS, ROUTINE W REFLEX MICROSCOPIC
Bacteria, UA: NONE SEEN
Bilirubin Urine: NEGATIVE
Glucose, UA: NEGATIVE mg/dL
Ketones, ur: NEGATIVE mg/dL
Leukocytes,Ua: NEGATIVE
Nitrite: NEGATIVE
Protein, ur: 30 mg/dL — AB
Specific Gravity, Urine: 1.025 (ref 1.005–1.030)
Squamous Epithelial / HPF: NONE SEEN /HPF (ref 0–5)
pH: 5 (ref 5.0–8.0)

## 2022-08-25 LAB — COMPREHENSIVE METABOLIC PANEL
ALT: 14 U/L (ref 0–44)
AST: 34 U/L (ref 15–41)
Albumin: 2.9 g/dL — ABNORMAL LOW (ref 3.5–5.0)
Alkaline Phosphatase: 82 U/L (ref 38–126)
Anion gap: 10 (ref 5–15)
BUN: 45 mg/dL — ABNORMAL HIGH (ref 8–23)
CO2: 25 mmol/L (ref 22–32)
Calcium: 8.4 mg/dL — ABNORMAL LOW (ref 8.9–10.3)
Chloride: 104 mmol/L (ref 98–111)
Creatinine, Ser: 1.07 mg/dL (ref 0.61–1.24)
GFR, Estimated: >60 mL/min (ref >60)
Glucose, Bld: 119 mg/dL — ABNORMAL HIGH (ref 70–99)
Potassium: 3.6 mmol/L (ref 3.5–5.1)
Sodium: 139 mmol/L (ref 135–145)
Total Bilirubin: 1 mg/dL (ref 0.3–1.2)
Total Protein: 6.4 g/dL — ABNORMAL LOW (ref 6.5–8.1)

## 2022-08-25 LAB — TSH: TSH: 3.884 u[IU]/mL (ref 0.350–4.500)

## 2022-08-25 LAB — MAGNESIUM: Magnesium: 2.1 mg/dL (ref 1.7–2.4)

## 2022-08-25 LAB — TROPONIN I (HIGH SENSITIVITY)
Troponin I (High Sensitivity): 12 ng/L (ref <18)
Troponin I (High Sensitivity): 11 ng/L (ref <18)

## 2022-08-25 LAB — T4, FREE: Free T4: 1.05 ng/dL (ref 0.61–1.12)

## 2022-08-25 LAB — CBG MONITORING, ED: Glucose-Capillary: 111 mg/dL — ABNORMAL HIGH (ref 70–99)

## 2022-08-25 MED ORDER — METOPROLOL TARTRATE 25 MG PO TABS
12.5000 mg | ORAL_TABLET | Freq: Two times a day (BID) | ORAL | Status: DC
  Administered 2022-08-26 (×2): 12.5 mg via ORAL
  Filled 2022-08-25 (×2): qty 1

## 2022-08-25 MED ORDER — SODIUM CHLORIDE 0.9 % IV BOLUS
500.0000 mL | Freq: Once | INTRAVENOUS | Status: AC
  Administered 2022-08-25: 500 mL via INTRAVENOUS

## 2022-08-25 MED ORDER — QUETIAPINE FUMARATE 25 MG PO TABS
75.0000 mg | ORAL_TABLET | Freq: Every day | ORAL | Status: DC
  Administered 2022-08-26 (×2): 75 mg via ORAL
  Filled 2022-08-25 (×2): qty 3

## 2022-08-25 MED ORDER — PROSIGHT PO TABS
1.0000 | ORAL_TABLET | Freq: Every day | ORAL | Status: DC
  Administered 2022-08-27 – 2022-09-07 (×11): 1 via ORAL
  Filled 2022-08-25 (×18): qty 1

## 2022-08-25 MED ORDER — BUDESONIDE 3 MG PO CPEP
3.0000 mg | ORAL_CAPSULE | Freq: Every day | ORAL | Status: DC
  Administered 2022-08-26 – 2022-09-07 (×9): 3 mg via ORAL
  Filled 2022-08-25 (×15): qty 1

## 2022-08-25 MED ORDER — METFORMIN HCL 500 MG PO TABS
500.0000 mg | ORAL_TABLET | Freq: Every day | ORAL | Status: DC
  Administered 2022-08-26: 500 mg via ORAL
  Filled 2022-08-25: qty 1

## 2022-08-25 MED ORDER — MIRABEGRON ER 25 MG PO TB24
25.0000 mg | ORAL_TABLET | Freq: Every day | ORAL | Status: DC
  Administered 2022-08-26 – 2022-09-04 (×6): 25 mg via ORAL
  Filled 2022-08-25 (×13): qty 1

## 2022-08-25 MED ORDER — ENSURE ENLIVE PO LIQD
237.0000 mL | Freq: Three times a day (TID) | ORAL | Status: DC
  Administered 2022-08-27 – 2022-09-05 (×17): 237 mL via ORAL

## 2022-08-25 MED ORDER — MEMANTINE HCL 5 MG PO TABS
10.0000 mg | ORAL_TABLET | Freq: Two times a day (BID) | ORAL | Status: DC
  Administered 2022-08-26 – 2022-09-07 (×26): 10 mg via ORAL
  Filled 2022-08-25 (×27): qty 2

## 2022-08-25 MED ORDER — PRAVASTATIN SODIUM 20 MG PO TABS
80.0000 mg | ORAL_TABLET | Freq: Every day | ORAL | Status: DC
  Administered 2022-08-26 – 2022-09-05 (×8): 80 mg via ORAL
  Filled 2022-08-25 (×9): qty 4

## 2022-08-25 MED ORDER — ASPIRIN 81 MG PO CHEW
81.0000 mg | CHEWABLE_TABLET | Freq: Two times a day (BID) | ORAL | Status: DC
  Administered 2022-08-25 – 2022-09-07 (×26): 81 mg via ORAL
  Filled 2022-08-25 (×27): qty 1

## 2022-08-25 MED ORDER — FINASTERIDE 5 MG PO TABS
5.0000 mg | ORAL_TABLET | Freq: Every day | ORAL | Status: DC
  Administered 2022-08-26: 5 mg via ORAL
  Filled 2022-08-25 (×3): qty 1

## 2022-08-25 MED ORDER — SENNA 8.6 MG PO TABS
1.0000 | ORAL_TABLET | Freq: Two times a day (BID) | ORAL | Status: DC
  Administered 2022-08-26 – 2022-08-29 (×8): 8.6 mg via ORAL
  Filled 2022-08-25 (×9): qty 1

## 2022-08-25 NOTE — ED Notes (Signed)
MD Quale notified of patient being lethargic throughout the day today. Patient arousable by touch but has not been able to take PO intake today or wake up enough to communicate verbally. VSS and BG checked. Per MD Quale nothing to evaluate or treat at this time.

## 2022-08-25 NOTE — ED Triage Notes (Signed)
EMS states they were called for a possible seizure states the patient was witnessed flickering his eye lids for about 30 seconds. States he has not been sleeping since he arrived at that [facility] care facility two days ago. Patient only complaint is some soreness from a fall he had two ago.

## 2022-08-25 NOTE — ED Notes (Signed)
Patient sleeping at this time. Patient daughter at bedside and agrees to wait until patient wakes up to give medications.

## 2022-08-25 NOTE — ED Provider Notes (Signed)
[facility] Provider Note    Event Date/Time   First MD Initiated Contact with Patient 08/25/22 0315     (approximate)   History   Seizure-like activity   HPI  Jordan [name] is a 89 y.o. male brought to the ED via EMS from SNF with a chief complaint of seizure-like activity.  No history of seizures.  Patient went to SNF 2 days ago status post hospitalization for right hip repair.  Staff states he had an unwitnessed fall 2 days ago; was not sent for evaluation.  Presents with old left forehead ecchymosis.  Denies LOC.  Staff reported that patient has not slept since being at the facility.  He was being given melatonin for sleep.  Staff noted a few seconds of eyes flickering tonight and called EMS for seizure-like activity.  There was no witnessed tonic-clonic movement, frothing at the mouth, eyes rolling in the top of his head, tongue biting, urinary incontinence or postictal state.  Patient voices no complaints of fever, cough, chest pain, shortness of breath, abdominal pain, nausea, vomiting or dizziness.  EMS reports patient took a nap and route to the hospital.  Arrives to the treatment room awake, alert and conversive despite being legally blind and extremely hard of hearing without his hearing aids.     Past Medical History   Past Medical History:  Diagnosis Date   Aortic stenosis    Arthritis    BPH (benign prostatic hypertrophy)    CAD (coronary artery disease)    DES to RCA 2009   Diverticulosis of colon    Essential hypertension    Fecal urgency    History of adenomatous polyp of colon    Tubular adenomas 2016   Hyperlipidemia    Lower urinary tract symptoms (LUTS)    Type 2 diabetes mellitus ([place])    Wears glasses      Active Problem List   Patient Active Problem List   Diagnosis Date Noted   Malnutrition of moderate degree 08/16/2022   Femur fracture ([place]) 08/15/2022   Hypokalemia 07/08/2022   Leukocytosis 07/08/2022    Chronic diastolic CHF (congestive heart failure) ([place]) 07/08/2022   Acute encephalopathy 97/08/6376   Acute metabolic encephalopathy 58/85/0277   Confusion with nonfocal neurological examination 06/20/2022   Dementia with behavioral disturbance ([place]) 06/20/2022   Lymphocytic colitis 10/13/2019   Benign prostatic hyperplasia with urinary obstruction 08/20/2019   Nocturia 08/20/2019   Pancreatitis 06/13/2016   Type 2 diabetes mellitus with complication (HCC)    S/P drug eluting coronary stent placement    Hypertension    Hyperlipidemia    History of colonic polyps    Diverticulosis of colon without hemorrhage    Hemorrhoids 10/23/2014   Constipation 06/03/2012   Balance disorder/Fall at home 05/29/2012     Past Surgical History   Past Surgical History:  Procedure Laterality Date   BIOPSY  10/02/2019   Procedure: BIOPSY;  Surgeon: Rogene Houston, MD;  [facility];  Service: Endoscopy;;   CATARACT EXTRACTION W/ INTRAOCULAR LENS  IMPLANT, BILATERAL     COLONOSCOPY  last one 11/16/2014   polpectomy   CORONARY ANGIOPLASTY WITH STENT PLACEMENT  12-23-2007  dr hochrein   (abnormal myoview)  PCI w/ DES x1 pRCA (95%),  pCFX 25%,  mCFX 40-50%,  after PDA 30%,  prox, mid, and ostial LAD  30% calcification,  LM diffuse 25% calcification,  ef 60%   EVALUATION UNDER ANESTHESIA WITH ANAL FISTULECTOMY [name] 06/03/2015  Procedure: ANAL EXAM UNDER ANESTHESIA ,FISTULOTOMY ;  Surgeon: [name], MD;  Location: [facility];  Service: General;  Laterality: [name];   FLEXIBLE SIGMOIDOSCOPY [name] 10/02/2019   Procedure: FLEXIBLE SIGMOIDOSCOPY;  Surgeon: Rogene Houston, MD;  [facility];  Service: Endoscopy;  Laterality: [name];  230   INGUINAL HERNIA REPAIR Right 2001   INTRAMEDULLARY (IM) NAIL INTERTROCHANTERIC Right 08/16/2022   Procedure: INTRAMEDULLARY (IM) NAIL INTERTROCHANTERIC;  Surgeon: Lovell Sheehan, MD;  Location: [facility];  Service: Orthopedics;   Laterality: Right;   TRANSTHORACIC ECHOCARDIOGRAM  12-06-2007   normal LVF, ef 60%/  mild to moderate  AV calcification without stenosis,  mild AR and TR,  mild LAE     Home Medications   Prior to Admission medications   Medication Sig Start Date End Date Taking? Authorizing Provider  aspirin 81 MG chewable tablet Chew 1 tablet (81 mg total) by mouth 2 (two) times daily. (0800) 08/22/22 10/13/22  Jennye Boroughs, MD  budesonide (ENTOCORT EC) 3 MG 24 hr capsule TAKE 1 CAPSULE BY MOUTH DAILY. Patient taking differently: Take 3 mg by mouth in the morning. (0800) 06/12/22   [name], MD  feeding supplement (ENSURE ENLIVE / ENSURE PLUS) LIQD Take 237 mLs by mouth 3 (three) times daily after meals. 07/06/22   [name], Mir M, MD  finasteride (PROSCAR) 5 MG tablet TAKE 1 TABLET BY MOUTH DAILY AS DIRECTED. 08/08/22   [name], [name], MD  memantine (NAMENDA) 10 MG tablet Take 10 mg by mouth 2 (two) times daily. (0800, 2000)    [provider]  metFORMIN (GLUCOPHAGE) 500 MG tablet Take 500 mg by mouth at bedtime. (2100)    [provider]  metoprolol tartrate (LOPRESSOR) 25 MG tablet Take 0.5 tablets (12.5 mg total) by mouth 2 (two) times daily. Patient taking differently: Take 12.5 mg by mouth 2 (two) times daily. (0800, 2000) 07/06/22 08/15/22  [name], Mir M, MD  mirabegron ER (MYRBETRIQ) 25 MG TB24 tablet Take 1 tablet (25 mg total) by mouth daily. Patient taking differently: Take 25 mg by mouth in the morning. (0900) 03/17/22   [name], [name], MD  Multiple Vitamins-Minerals (PRESERVISION AREDS) TABS Take 1 tablet by mouth in the morning. (0800)    [provider]  pravastatin (PRAVACHOL) 80 MG tablet Take 80 mg by mouth daily at 6 PM.    [provider]  QUEtiapine (SEROQUEL) 25 MG tablet Take 3 tablets (75 mg total) by mouth at bedtime. Patient taking differently: Take 75 mg by mouth at bedtime. (2100) 07/06/22 08/15/22  [name], Mir M,  MD  senna (SENOKOT) 8.6 MG TABS tablet Take 1 tablet (8.6 mg total) by mouth 2 (two) times daily. 08/22/22 09/21/22  Jennye Boroughs, MD     Allergies  Patient has no known allergies.   Family History   Family History  Problem Relation Age of Onset   Heart disease Brother    Colon cancer Neg Hx      Physical Exam  Triage Vital Signs: ED Triage Vitals  Enc Vitals Group     BP      Pulse      Resp      Temp      Temp src      SpO2      Weight      Height      Head Circumference      Peak Flow      Pain Score  Pain Loc      Pain Edu?      Excl. in [place]?     Updated Vital Signs: BP (!) 125/51 (BP Location: Right Arm)   Pulse 88   Temp 97.7 F (36.5 C) (Oral)   Resp 17   Ht 5\' 9"  (1.753 m)   SpO2 99%   BMI 17.91 kg/m    General: Awake, no distress.  CV:  RRR.  Good peripheral perfusion.  Resp:  Normal effort.  CTAB. Abd:  Nontender.  No distention.  Other:  PERRL.  EOMI.  Old ecchymosis to right forehead.  Nose is atraumatic.  No dental malocclusion.  No deadline cervical spine tenderness to palpation.  Pelvis is stable.  Alert and oriented to person and place.  CN II-XII grossly intact.  5/5 motor strength and sensation all extremities.   ED Results / Procedures / Treatments  Labs (all labs ordered are listed, but only abnormal results are displayed) Labs Reviewed  CBC WITH DIFFERENTIAL/PLATELET - Abnormal; Notable for the following components:      Result Value   RBC 3.56 (*)    Hemoglobin 10.0 (*)    HCT 32.4 (*)    Platelets 456 (*)    All other components within normal limits  COMPREHENSIVE METABOLIC PANEL - Abnormal; Notable for the following components:   Glucose, Bld 119 (*)    BUN 45 (*)    Calcium 8.4 (*)    Total Protein 6.4 (*)    Albumin 2.9 (*)    All other components within normal limits  MAGNESIUM  TSH  T4, FREE  URINALYSIS, ROUTINE W REFLEX MICROSCOPIC  TROPONIN I (HIGH SENSITIVITY)  TROPONIN I (HIGH SENSITIVITY)      EKG  ED ECG REPORT I, Akram Kissick J, the attending physician, personally viewed and interpreted this ECG.   Date: 08/25/2022  EKG Time: 0319  Rate: 89  Rhythm: normal sinus rhythm  Axis: LAD  Intervals:left anterior fascicular block  ST&T Change: Nonspecific    RADIOLOGY I have independently visualized and interpreted patient's CT and x-rays as well as noted the radiology interpretation:  CT head: No ICH  Chest x-ray: No acute cardiopulmonary process  Pelvis x-ray: Stable postoperative image  Official radiology report(s): DG Pelvis 1-2 Views  Result Date: 08/25/2022 CLINICAL DATA:  89 year old male with recent fall and seizure like activity. Status post right hip ORIF on 08/16/2022. EXAM: PELVIS - 1-2 VIEW COMPARISON:  Intraoperative right hip ORIF images 13124. Pelvis radiograph 07/08/2022. FINDINGS: AP view at 0343 hours. Recent proximal right femur ORIF with overlying skin staples still in place. Visible hardware appears intact. Near anatomic alignment, aside from proximally displaced lesser trochanter comminution fragment. Stable bony pelvis compared to December. Left femoral head remains located. Extensive iliofemoral calcified atherosclerosis. Negative visible bowel gas. IMPRESSION: 1. Recent right femur ORIF, incompletely visible but no adverse features identified. 2. No superimposed acute pelvis fracture identified. 3. Extensive iliofemoral calcified atherosclerosis. Electronically Signed   By: Genevie Ann M.D.   On: 08/25/2022 04:07   DG Chest 1 View  Result Date: 08/25/2022 CLINICAL DATA:  89 year old male with recent fall and seizure like activity. EXAM: CHEST  1 VIEW COMPARISON:  Portable chest 08/15/2022 and earlier. FINDINGS: Portable AP supine view at 0344 hours. Stable to mildly improved lung volumes. Calcified aortic atherosclerosis. Other mediastinal contours are within normal limits. Visualized tracheal air column is within normal limits. Allowing for portable  technique the lungs are clear. No pneumothorax or pleural effusion. No acute  osseous abnormality identified. Negative visible bowel gas. IMPRESSION: 1. No acute cardiopulmonary abnormality or acute traumatic injury identified. 2. Aortic Atherosclerosis (ICD10-I70.0). Electronically Signed   By: Genevie Ann M.D.   On: 08/25/2022 04:05   CT Head Wo Contrast  Result Date: 08/25/2022 CLINICAL DATA:  Head trauma, minor (Age >= 65y) fall 2 days ago, head contusion EXAM: CT HEAD WITHOUT CONTRAST TECHNIQUE: Contiguous axial images were obtained from the base of the skull through the vertex without intravenous contrast. RADIATION DOSE REDUCTION: This exam was performed according to the departmental dose-optimization program which includes automated exposure control, adjustment of the mA and/or kV according to patient size and/or use of iterative reconstruction technique. COMPARISON:  08/11/2022 FINDINGS: Brain: Diffuse cerebral atrophy. Old right basal ganglia lacunar infarct. No acute intracranial abnormality. Specifically, no hemorrhage, hydrocephalus, mass lesion, acute infarction, or significant intracranial injury. Vascular: No hyperdense vessel or unexpected calcification. Skull: No acute calvarial abnormality. Sinuses/Orbits: No acute findings Other: None IMPRESSION: No acute intracranial abnormality. Electronically Signed   By: Rolm Baptise M.D.   On: 08/25/2022 03:52     PROCEDURES:  Critical Care performed: No  .1-3 Lead EKG Interpretation  Performed by: Paulette Blanch, MD Authorized by: Paulette Blanch, MD     Interpretation: normal     ECG rate:  85   ECG rate assessment: normal     Rhythm: sinus rhythm     Ectopy: none     Conduction: normal   Comments:     Patient placed on cardiac monitor to evaluate for arrhythmias    MEDICATIONS ORDERED IN ED: Medications  aspirin chewable tablet 81 mg (has no administration in time range)  budesonide (ENTOCORT EC) 24 hr capsule 3 mg (has no administration  in time range)  feeding supplement (ENSURE ENLIVE / ENSURE PLUS) liquid 237 mL (has no administration in time range)  finasteride (PROSCAR) tablet 5 mg (has no administration in time range)  memantine (NAMENDA) tablet 10 mg (has no administration in time range)  metFORMIN (GLUCOPHAGE) tablet 500 mg (has no administration in time range)  metoprolol tartrate (LOPRESSOR) tablet 12.5 mg (has no administration in time range)  mirabegron ER (MYRBETRIQ) tablet 25 mg (has no administration in time range)  pravastatin (PRAVACHOL) tablet 80 mg (has no administration in time range)  QUEtiapine (SEROQUEL) tablet 75 mg (has no administration in time range)  senna (SENOKOT) tablet 8.6 mg (has no administration in time range)  multivitamin (PROSIGHT) tablet 1 tablet (has no administration in time range)  sodium chloride 0.9 % bolus 500 mL (500 mLs Intravenous New Bag/Given 08/25/22 0447)     IMPRESSION / MDM / ASSESSMENT AND PLAN / ED COURSE  I reviewed the triage vital signs and the nursing notes.                             89 year old male presenting with brief episode of eye flickering which nursing facility staff called seizure-like activity.  Recent hip repair, unwitnessed fall at the facility.  Differential diagnosis includes but is not limited to [place], SDH, seizure, sleep state, ACS, infectious, metabolic etiologies, etc.  I personally reviewed patient's records and note his hospitalization for hip fracture on 08/15/2022.  Patient's presentation is most consistent with acute presentation with potential threat to life or bodily function.  The patient is on the cardiac monitor to evaluate for evidence of arrhythmia and/or significant heart rate changes.  Will obtain lab work, CT  head, chest x-ray, pelvis x-ray.  Patient voices no complaints at this time.  Will reassess.  Clinical Course as of 08/25/22 0631  Fri Aug 25, 2022  0437 Updated patient's daughter who is currently at bedside of all test  results.  Condom cath placed to collect urinalysis.  Daughter expresses frustration and displeasure at patient's current SNF due to fall and she does not like the care that he has been receiving there.  Will place consult for social work for alternate SNF placement.  Very low suspicion that patient had seizure.  Daughter in fact tells me that she has visited the patient yesterday and he took a nap during her visit. [JS]  0631 Repeat troponin is negative [JS]    Clinical Course User Index [JS] Paulette Blanch, MD     FINAL CLINICAL IMPRESSION(S) / ED DIAGNOSES   Final diagnoses:  Dehydration, mild     Rx / DC Orders   ED Discharge Orders     None        Note:  This document was prepared using Dragon voice recognition software and may include unintentional dictation errors.   Paulette Blanch, MD 08/25/22 [phone]

## 2022-08-25 NOTE — ED Notes (Signed)
Patient inquiring about social work consult at this time. This RN has informed family multiple times that an order was placed for [facility] team but not sure how long it will take them to get to this patient. Social work Optometrist messaged to see if she thinks patient will be seen today or not.

## 2022-08-25 NOTE — NC FL2 (Signed)
[place] LEVEL OF CARE FORM     IDENTIFICATION  Patient Name: Jordan [name] Birthdate: 04-17-1923 Sex: male Admission Date (Current Location): 08/25/2022  [facility] and [place] Number:  Engineering geologist and Address:  [facility], [address], [place],  [address]      Provider Number: [id number]  Attending Physician Name and Address:  No att. providers found  Relative Name and Phone Number:  [name], daughter, [phone]    Current Level of Care: Hospital Recommended Level of Care: [place] Prior Approval Number:    Date Approved/Denied:   PASRR Number: [id number]  Discharge Plan: SNF    Current Diagnoses: Patient Active Problem List   Diagnosis Date Noted   Malnutrition of moderate degree 08/16/2022   Femur fracture ([place]) 08/15/2022   Hypokalemia 07/08/2022   Leukocytosis 07/08/2022   Chronic diastolic CHF (congestive heart failure) ([place]) 07/08/2022   Acute encephalopathy 25/95/6387   Acute metabolic encephalopathy 56/43/3295   Confusion with nonfocal neurological examination 06/20/2022   Dementia with behavioral disturbance ([place]) 06/20/2022   Lymphocytic colitis 10/13/2019   Benign prostatic hyperplasia with urinary obstruction 08/20/2019   Nocturia 08/20/2019   Pancreatitis 06/13/2016   Type 2 diabetes mellitus with complication ([place])    S/P drug eluting coronary stent placement    Hypertension    Hyperlipidemia    History of colonic polyps    Diverticulosis of colon without hemorrhage    Hemorrhoids 10/23/2014   Constipation 06/03/2012   Balance disorder/Fall at home 05/29/2012    Orientation RESPIRATION BLADDER Height & Weight        Normal Incontinent, External catheter Weight:   Height:  5\' 9"  (175.3 cm)  BEHAVIORAL SYMPTOMS/MOOD NEUROLOGICAL BOWEL NUTRITION STATUS      Incontinent Diet (Regular)  AMBULATORY STATUS COMMUNICATION OF NEEDS Skin   Extensive Assist Verbally  Bruising (Both legs)                       Personal Care Assistance Level of Assistance  Total care   Feeding assistance: Maximum assistance Dressing Assistance: Maximum assistance Total Care Assistance: Maximum assistance   Functional Limitations Info  Sight, Hearing, Speech Sight Info: Impaired Hearing Info: Impaired Speech Info: Adequate    SPECIAL CARE FACTORS FREQUENCY  PT (By licensed PT), OT (By licensed OT)     PT Frequency: 5 times per week OT Frequency: 5 times per week            Contractures Contractures Info: Not present    Additional Factors Info  Code Status, Allergies Code Status Info: DNR Allergies Info: NKA           Current Medications (08/25/2022):  This is the current hospital active medication list Current Facility-Administered Medications  Medication Dose Route Frequency Provider Last Rate Last Admin   aspirin chewable tablet 81 mg  81 mg Oral BID Paulette Blanch, MD       budesonide (ENTOCORT EC) 24 hr capsule 3 mg  3 mg Oral Daily Paulette Blanch, MD       feeding supplement (ENSURE ENLIVE / ENSURE PLUS) liquid 237 mL  237 mL Oral TID PC Paulette Blanch, MD       finasteride (PROSCAR) tablet 5 mg  5 mg Oral Daily Paulette Blanch, MD       memantine [facility]) tablet 10 mg  10 mg Oral BID Paulette Blanch, MD       metFORMIN (  GLUCOPHAGE) tablet 500 mg  500 mg Oral QHS Paulette Blanch, MD       metoprolol tartrate (LOPRESSOR) tablet 12.5 mg  12.5 mg Oral BID Paulette Blanch, MD       mirabegron ER [facility]) tablet 25 mg  25 mg Oral Daily Paulette Blanch, MD       multivitamin (PROSIGHT) tablet 1 tablet  1 tablet Oral Daily Paulette Blanch, MD       pravastatin (PRAVACHOL) tablet 80 mg  80 mg Oral q1800 Paulette Blanch, MD       QUEtiapine (SEROQUEL) tablet 75 mg  75 mg Oral QHS Paulette Blanch, MD       senna [facility]) tablet 8.6 mg  1 tablet Oral BID Paulette Blanch, MD       Current Outpatient Medications  Medication Sig Dispense Refill   acetaminophen (TYLENOL) 325 MG  tablet Take 650 mg by mouth 3 (three) times daily.     aspirin 81 MG chewable tablet Chew 1 tablet (81 mg total) by mouth 2 (two) times daily. (0800)  0   atorvastatin (LIPITOR) 20 MG tablet Take 20 mg by mouth daily.     budesonide (ENTOCORT EC) 3 MG 24 hr capsule TAKE 1 CAPSULE BY MOUTH DAILY. (Patient taking differently: Take 3 mg by mouth in the morning. (0800)) 90 capsule 0   finasteride (PROSCAR) 5 MG tablet TAKE 1 TABLET BY MOUTH DAILY AS DIRECTED. (Patient taking differently: Take 5 mg by mouth daily.) 90 tablet 0   melatonin 5 MG TABS Take 5 mg by mouth at bedtime as needed (sleep).     memantine (NAMENDA) 10 MG tablet Take 10 mg by mouth 2 (two) times daily. (0800, 2000)     metoprolol tartrate (LOPRESSOR) 25 MG tablet Take 0.5 tablets (12.5 mg total) by mouth 2 (two) times daily. (Patient taking differently: Take 12.5 mg by mouth 2 (two) times daily. (0800, 2000)) 30 tablet 0   Multiple Vitamins-Minerals (PRESERVISION AREDS) TABS Take 1 tablet by mouth in the morning. (0800)     QUEtiapine (SEROQUEL) 25 MG tablet Take 3 tablets (75 mg total) by mouth at bedtime. (Patient taking differently: Take 50 mg by mouth at bedtime. (2100)) 90 tablet 0   senna (SENOKOT) 8.6 MG TABS tablet Take 1 tablet (8.6 mg total) by mouth 2 (two) times daily. 60 tablet 0   feeding supplement (ENSURE ENLIVE / ENSURE PLUS) LIQD Take 237 mLs by mouth 3 (three) times daily after meals. 237 mL 12   metFORMIN (GLUCOPHAGE) 500 MG tablet Take 500 mg by mouth at bedtime. (2100) (Patient not taking: Reported on 08/25/2022)     mirabegron ER (MYRBETRIQ) 25 MG TB24 tablet Take 1 tablet (25 mg total) by mouth daily. (Patient not taking: Reported on 08/25/2022) 30 tablet 0   pravastatin (PRAVACHOL) 80 MG tablet Take 80 mg by mouth daily at 6 PM. (Patient not taking: Reported on 08/25/2022)       Discharge Medications: Please see discharge summary for a list of discharge medications.  Relevant Imaging Results:  Relevant Lab  Results:   Additional Information SSN: [id number]  Shelbie Hutching, RN

## 2022-08-25 NOTE — ED Notes (Signed)
Patient woken up at this time, patient alert to voice but still lethargic and sleeping after stimulation.

## 2022-08-25 NOTE — Progress Notes (Signed)
PT Cancellation Note  Patient Details Name: Jordan [name] MRN: [id number] DOB: June 10, 1923   Cancelled Treatment:    Reason Eval/Treat Not Completed: Fatigue/lethargy limiting ability to participate;Patient's level of consciousness. Unable to rouse patient to participate in PT eval. Daughter at bedside. RN notified. Will re-attempt tomorrow as appropriate.    Roseanne Juenger 08/25/2022, 1:45 PM

## 2022-08-25 NOTE — TOC Initial Note (Signed)
Transition of Care [facility]) - Initial/Assessment Note    Patient Details  Name: Jordan [name] MRN: [id number] Date of Birth: 1922/09/16  Transition of Care [facility]) CM/SW Contact:    Shelbie Hutching, RN Phone Number: 08/25/2022, 3:18 PM  Clinical Narrative:                 Patient came in from [facility] after falling the other [place] and staff believed he could have been having a seizure.  Currently medical workup has been negative.  RNCM met with daughter at the bedside, she is not happy that patient has only been at [facility] for a couple of days and already back at the hospital.  She would really like for patient to go to [facility].  RNCM will send out bed search and include [facility].  Told her that patient would likely be here through the weekend.   Expected Discharge Plan: [place] Barriers to Discharge: SNF Pending bed offer   Patient Goals and CMS Choice Patient states their goals for this hospitalization and ongoing recovery are:: daughter is not happy with [facility] CMS Medicare.gov Compare Post Acute Care list provided to:: Patient Represenative (must comment) Choice offered to / list presented to : Adult Children      Expected Discharge Plan and Services   Discharge Planning Services: CM Consult Post Acute Care Choice: [place] arrangements for the past 2 months: Single Family Home                 DME Arranged: [name] DME Agency: NA       HH Arranged: NA          Prior Living Arrangements/Services Living arrangements for the past 2 months: Single Family Home Lives with:: Self Patient language and need for interpreter reviewed:: Yes        Need for Family Participation in Patient Care: Yes (Comment) Care giver support system in place?: Yes (comment) Current home services: DME Criminal Activity/Legal Involvement Pertinent to Current Situation/Hospitalization: No - Comment as needed  Activities of  Daily Living      Permission Sought/Granted Permission sought to share information with : Case Manager, Customer service manager, Family Supports Permission granted to share information with : Yes, Verbal Permission Granted  Share Information with NAME: [name]  Permission granted to share info w AGENCY: SNF's  Permission granted to share info w Relationship: daughter  Permission granted to share info w Contact Information: [phone]  Emotional Assessment Appearance:: Appears stated age Attitude/Demeanor/Rapport: Lethargic Affect (typically observed): Unable to Assess   Alcohol / Substance Use: Not Applicable Psych Involvement: No (comment)  Admission diagnosis:  Seizure Like Activity Patient Active Problem List   Diagnosis Date Noted   Malnutrition of moderate degree 08/16/2022   Femur fracture ([place]) 08/15/2022   Hypokalemia 07/08/2022   Leukocytosis 07/08/2022   Chronic diastolic CHF (congestive heart failure) ([place]) 07/08/2022   Acute encephalopathy 20/94/7096   Acute metabolic encephalopathy 28/36/6294   Confusion with nonfocal neurological examination 06/20/2022   Dementia with behavioral disturbance ([place]) 06/20/2022   Lymphocytic colitis 10/13/2019   Benign prostatic hyperplasia with urinary obstruction 08/20/2019   Nocturia 08/20/2019   Pancreatitis 06/13/2016   Type 2 diabetes mellitus with complication (HCC)    S/P drug eluting coronary stent placement    Hypertension    Hyperlipidemia    History of colonic polyps    Diverticulosis of colon without hemorrhage    Hemorrhoids 10/23/2014  Constipation 06/03/2012   Balance disorder/Fall at home 05/29/2012   PCP:  Asencion Noble, MD Pharmacy:   [facility], [address] [place] [address] Phone: [phone] Fax: [phone]     Social Determinants of Health (SDOH) Social History: [place]: No Food Insecurity  (08/16/2022)  Housing: Low Risk  (08/16/2022)  Transportation Needs: No Transportation Needs (08/16/2022)  Utilities: Not At Risk (08/16/2022)  Tobacco Use: Medium Risk (08/17/2022)   SDOH Interventions:     Readmission Risk Interventions    07/18/2022    2:18 PM 07/14/2022   11:46 AM 07/11/2022   12:11 PM  Readmission Risk Prevention Plan  Transportation Screening  Complete Complete  PCP or Specialist Appt within 3-5 Days  Complete Complete  HRI or [place]  Complete Complete  Social Work Consult for [place] Planning/Counseling  Complete Complete  Palliative Care Screening  Not Applicable Not Applicable  Medication Review Press photographer)  Complete Complete  PCP or Specialist appointment within 3-5 days of discharge Complete    HRI or [place] Complete    SW Recovery Care/Counseling Consult Complete    [place] Not Applicable

## 2022-08-25 NOTE — ED Provider Notes (Signed)
Vitals:   08/25/22 1600 08/25/22 1800  BP: 121/80 (!) 125/57  Pulse:  78  Resp:  (!) 21  Temp:    SpO2:  98%    Currently resting at home without distress.  Even unlabored respirations.  Awaiting disposition from [facility] team.  Reviewed the patient's workup from earlier today had an extensive ED evaluation, no acute findings to noted.  He does have notable fatigue, but arousable to touch.  Continue to monitor closely, patient fairly sedate throughout the course of the day but will be continued to be monitored.   Delman Kitten, MD 08/25/22 2038

## 2022-08-26 ENCOUNTER — Encounter: Payer: Self-pay | Admitting: Osteopathic Medicine

## 2022-08-26 DIAGNOSIS — E860 Dehydration: Secondary | ICD-10-CM | POA: Diagnosis present

## 2022-08-26 DIAGNOSIS — R5381 Other malaise: Secondary | ICD-10-CM | POA: Diagnosis present

## 2022-08-26 DIAGNOSIS — F03918 Unspecified dementia, unspecified severity, with other behavioral disturbance: Secondary | ICD-10-CM | POA: Diagnosis present

## 2022-08-26 DIAGNOSIS — Z7982 Long term (current) use of aspirin: Secondary | ICD-10-CM | POA: Diagnosis not present

## 2022-08-26 DIAGNOSIS — W19XXXD Unspecified fall, subsequent encounter: Secondary | ICD-10-CM | POA: Diagnosis present

## 2022-08-26 DIAGNOSIS — E440 Moderate protein-calorie malnutrition: Secondary | ICD-10-CM | POA: Diagnosis present

## 2022-08-26 DIAGNOSIS — A4150 Gram-negative sepsis, unspecified: Secondary | ICD-10-CM | POA: Diagnosis not present

## 2022-08-26 DIAGNOSIS — S7290XD Unspecified fracture of unspecified femur, subsequent encounter for closed fracture with routine healing: Secondary | ICD-10-CM | POA: Diagnosis not present

## 2022-08-26 DIAGNOSIS — F03911 Unspecified dementia, unspecified severity, with agitation: Secondary | ICD-10-CM | POA: Diagnosis present

## 2022-08-26 DIAGNOSIS — N138 Other obstructive and reflux uropathy: Secondary | ICD-10-CM | POA: Diagnosis present

## 2022-08-26 DIAGNOSIS — K5730 Diverticulosis of large intestine without perforation or abscess without bleeding: Secondary | ICD-10-CM | POA: Diagnosis present

## 2022-08-26 DIAGNOSIS — N390 Urinary tract infection, site not specified: Secondary | ICD-10-CM

## 2022-08-26 DIAGNOSIS — Z7984 Long term (current) use of oral hypoglycemic drugs: Secondary | ICD-10-CM | POA: Diagnosis not present

## 2022-08-26 DIAGNOSIS — S72144A Nondisplaced intertrochanteric fracture of right femur, initial encounter for closed fracture: Secondary | ICD-10-CM | POA: Diagnosis not present

## 2022-08-26 DIAGNOSIS — E785 Hyperlipidemia, unspecified: Secondary | ICD-10-CM | POA: Diagnosis present

## 2022-08-26 DIAGNOSIS — Z66 Do not resuscitate: Secondary | ICD-10-CM | POA: Diagnosis present

## 2022-08-26 DIAGNOSIS — H9190 Unspecified hearing loss, unspecified ear: Secondary | ICD-10-CM | POA: Diagnosis present

## 2022-08-26 DIAGNOSIS — Z681 Body mass index (BMI) 19 or less, adult: Secondary | ICD-10-CM | POA: Diagnosis not present

## 2022-08-26 DIAGNOSIS — R569 Unspecified convulsions: Secondary | ICD-10-CM | POA: Diagnosis present

## 2022-08-26 DIAGNOSIS — N401 Enlarged prostate with lower urinary tract symptoms: Secondary | ICD-10-CM | POA: Diagnosis present

## 2022-08-26 DIAGNOSIS — Z8249 Family history of ischemic heart disease and other diseases of the circulatory system: Secondary | ICD-10-CM | POA: Diagnosis not present

## 2022-08-26 DIAGNOSIS — F05 Delirium due to known physiological condition: Secondary | ICD-10-CM | POA: Diagnosis not present

## 2022-08-26 DIAGNOSIS — E118 Type 2 diabetes mellitus with unspecified complications: Secondary | ICD-10-CM | POA: Diagnosis present

## 2022-08-26 DIAGNOSIS — I110 Hypertensive heart disease with heart failure: Secondary | ICD-10-CM | POA: Diagnosis present

## 2022-08-26 DIAGNOSIS — I5032 Chronic diastolic (congestive) heart failure: Secondary | ICD-10-CM | POA: Diagnosis present

## 2022-08-26 DIAGNOSIS — A419 Sepsis, unspecified organism: Secondary | ICD-10-CM | POA: Diagnosis present

## 2022-08-26 DIAGNOSIS — H548 Legal blindness, as defined in USA: Secondary | ICD-10-CM | POA: Diagnosis present

## 2022-08-26 DIAGNOSIS — A4159 Other Gram-negative sepsis: Secondary | ICD-10-CM | POA: Diagnosis present

## 2022-08-26 LAB — CBC WITH DIFFERENTIAL/PLATELET
Abs Immature Granulocytes: 0.04 10*3/uL (ref 0.00–0.07)
Basophils Absolute: 0 10*3/uL (ref 0.0–0.1)
Basophils Relative: 1 %
Eosinophils Absolute: 0.1 10*3/uL (ref 0.0–0.5)
Eosinophils Relative: 1 %
HCT: 28 % — ABNORMAL LOW (ref 39.0–52.0)
Hemoglobin: 8.7 g/dL — ABNORMAL LOW (ref 13.0–17.0)
Immature Granulocytes: 1 %
Lymphocytes Relative: 6 %
Lymphs Abs: 0.5 10*3/uL — ABNORMAL LOW (ref 0.7–4.0)
MCH: 28.2 pg (ref 26.0–34.0)
MCHC: 31.1 g/dL (ref 30.0–36.0)
MCV: 90.9 fL (ref 80.0–100.0)
Monocytes Absolute: 0.4 10*3/uL (ref 0.1–1.0)
Monocytes Relative: 4 %
Neutro Abs: 7.8 10*3/uL — ABNORMAL HIGH (ref 1.7–7.7)
Neutrophils Relative %: 87 %
Platelets: 394 10*3/uL (ref 150–400)
RBC: 3.08 MIL/uL — ABNORMAL LOW (ref 4.22–5.81)
RDW: 13.8 % (ref 11.5–15.5)
WBC: 8.9 10*3/uL (ref 4.0–10.5)
nRBC: 0 % (ref 0.0–0.2)

## 2022-08-26 LAB — CBC
HCT: 25.5 % — ABNORMAL LOW (ref 39.0–52.0)
Hemoglobin: 8.1 g/dL — ABNORMAL LOW (ref 13.0–17.0)
MCH: 29 pg (ref 26.0–34.0)
MCHC: 31.8 g/dL (ref 30.0–36.0)
MCV: 91.4 fL (ref 80.0–100.0)
Platelets: 359 10*3/uL (ref 150–400)
RBC: 2.79 MIL/uL — ABNORMAL LOW (ref 4.22–5.81)
RDW: 14 % (ref 11.5–15.5)
WBC: 16 10*3/uL — ABNORMAL HIGH (ref 4.0–10.5)
nRBC: 0 % (ref 0.0–0.2)

## 2022-08-26 LAB — URINALYSIS, ROUTINE W REFLEX MICROSCOPIC
RBC / HPF: >50 RBC/hpf (ref 0–5)
Specific Gravity, Urine: 1.024 (ref 1.005–1.030)
Squamous Epithelial / HPF: NONE SEEN /HPF (ref 0–5)
WBC, UA: >50 WBC/hpf (ref 0–5)

## 2022-08-26 LAB — BASIC METABOLIC PANEL
Anion gap: 7 (ref 5–15)
BUN: 37 mg/dL — ABNORMAL HIGH (ref 8–23)
CO2: 29 mmol/L (ref 22–32)
Calcium: 8.2 mg/dL — ABNORMAL LOW (ref 8.9–10.3)
Chloride: 101 mmol/L (ref 98–111)
Creatinine, Ser: 1.03 mg/dL (ref 0.61–1.24)
GFR, Estimated: >60 mL/min (ref >60)
Glucose, Bld: 130 mg/dL — ABNORMAL HIGH (ref 70–99)
Potassium: 4 mmol/L (ref 3.5–5.1)
Sodium: 137 mmol/L (ref 135–145)

## 2022-08-26 LAB — GLUCOSE, CAPILLARY
Glucose-Capillary: 148 mg/dL — ABNORMAL HIGH (ref 70–99)
Glucose-Capillary: 140 mg/dL — ABNORMAL HIGH (ref 70–99)

## 2022-08-26 LAB — CREATININE, SERUM
Creatinine, Ser: 1 mg/dL (ref 0.61–1.24)
GFR, Estimated: >60 mL/min (ref >60)

## 2022-08-26 MED ORDER — ACETAMINOPHEN 325 MG PO TABS
650.0000 mg | ORAL_TABLET | Freq: Four times a day (QID) | ORAL | Status: DC | PRN
  Administered 2022-09-04 – 2022-09-06 (×2): 650 mg via ORAL
  Filled 2022-08-26 (×2): qty 2

## 2022-08-26 MED ORDER — SODIUM CHLORIDE 0.9 % IV SOLN
1.0000 g | INTRAVENOUS | Status: DC
  Administered 2022-08-27 – 2022-09-04 (×9): 1 g via INTRAVENOUS
  Filled 2022-08-26 (×5): qty 10
  Filled 2022-08-26 (×3): qty 1
  Filled 2022-08-26: qty 10

## 2022-08-26 MED ORDER — SODIUM CHLORIDE 0.9 % IV SOLN
1.0000 g | Freq: Once | INTRAVENOUS | Status: AC
  Administered 2022-08-26: 1 g via INTRAVENOUS
  Filled 2022-08-26: qty 10

## 2022-08-26 MED ORDER — LACTATED RINGERS IV BOLUS
1000.0000 mL | Freq: Once | INTRAVENOUS | Status: AC
  Administered 2022-08-26: 1000 mL via INTRAVENOUS

## 2022-08-26 MED ORDER — ENOXAPARIN SODIUM 40 MG/0.4ML IJ SOSY
40.0000 mg | PREFILLED_SYRINGE | INTRAMUSCULAR | Status: DC
  Administered 2022-08-26 – 2022-09-06 (×12): 40 mg via SUBCUTANEOUS
  Filled 2022-08-26 (×12): qty 0.4

## 2022-08-26 MED ORDER — DOCUSATE SODIUM 50 MG/5ML PO LIQD
100.0000 mg | Freq: Every day | ORAL | Status: DC
  Administered 2022-08-27 – 2022-09-07 (×9): 100 mg via ORAL
  Filled 2022-08-26 (×13): qty 10

## 2022-08-26 MED ORDER — DOCUSATE SODIUM 100 MG PO CAPS: 100.0000 mg | ORAL_CAPSULE | Freq: Two times a day (BID) | ORAL | Status: DC

## 2022-08-26 MED ORDER — ACETAMINOPHEN 325 MG PO TABS
650.0000 mg | ORAL_TABLET | Freq: Four times a day (QID) | ORAL | Status: DC | PRN
  Administered 2022-08-26: 650 mg via ORAL
  Filled 2022-08-26: qty 2

## 2022-08-26 MED ORDER — BISACODYL 10 MG RE SUPP
10.0000 mg | Freq: Every day | RECTAL | Status: DC | PRN
  Administered 2022-09-01: 10 mg via RECTAL
  Filled 2022-08-26: qty 1

## 2022-08-26 MED ORDER — MIDODRINE HCL 5 MG PO TABS
10.0000 mg | ORAL_TABLET | Freq: Once | ORAL | Status: AC
  Administered 2022-08-26: 10 mg via ORAL
  Filled 2022-08-26: qty 2

## 2022-08-26 MED ORDER — ACETAMINOPHEN 650 MG RE SUPP: 650.0000 mg | Freq: Four times a day (QID) | RECTAL | Status: DC | PRN

## 2022-08-26 MED ORDER — SODIUM CHLORIDE 0.9 % IV BOLUS
1000.0000 mL | Freq: Once | INTRAVENOUS | Status: AC
  Administered 2022-08-26: 1000 mL via INTRAVENOUS

## 2022-08-26 MED ORDER — OXYCODONE HCL 5 MG PO TABS
5.0000 mg | ORAL_TABLET | ORAL | Status: DC | PRN
  Administered 2022-09-04: 5 mg via ORAL
  Filled 2022-08-26: qty 1

## 2022-08-26 MED ORDER — MELATONIN 5 MG PO TABS
5.0000 mg | ORAL_TABLET | Freq: Every evening | ORAL | Status: DC | PRN
  Administered 2022-09-04: 5 mg via ORAL
  Filled 2022-08-26: qty 1

## 2022-08-26 MED ORDER — LACTATED RINGERS IV SOLN: INTRAVENOUS | Status: DC

## 2022-08-26 NOTE — Hospital Course (Addendum)
Recently in hospital 08/15/22-08/22/22 for femur fracture after fall at his facility [facility]). He underwent right intramedullary nail intertrochanteric on 08/16/2022. DIscharged to SNF Rehab at [facility].   Returned to ED early yesterday AM approx 03:00 for "seizure like activity" per staff - states the patient was witnessed flickering his eye lids for about 30 seconds and he had not been sleeping since he arrived at that [place] care facility two days prior. There was no witnessed tonic-clonic movement, frothing at the mouth, eyes rolling in the top of his head, tongue biting, urinary incontinence or postictal state.  Patient only complaint was some soreness from a fall he had two ago. Was not sent for evaluation following that fall. No LOC. Daughter expressed frustration and displeasure at patient's current SNF due to fall and she did not like the care that he has been receiving there. ED placed consult for social work for alternate SNF placement.   As of early AM 08/26/22, patient had been boarding for the past 26+ hours in the ED pending TOC placement.  Staff alerted ED MD that patient has been sleeping all day and night, not gotten up and not eaten or drank anything, when they changed his brief, there was gross blood anteriorly over the diaper, around his urethral meatus.  ED MD ordered repeat labs and urinalysis which were distinctly different than the initial diagnostics done about 24 hours prior. Started ceftriaxone, consulted hospitalist for admission. HR and RR increased and UA concerning for UTI = sepsis. Admitted to hospitalist service, carryover 08/26/22 in the morning. Continuing abx pending cultures.   Per RN very agitated and combative overnight, requiring sitter. UCx 02/11 (+)GNR     Consultants:  none  Procedures: none      ASSESSMENT & PLAN:   Principal Problem:   Sepsis due to gram-negative UTI (HCC) Active Problems:   Femur fracture (HCC)   Dementia  with behavioral disturbance (HCC)   Chronic diastolic CHF (congestive heart failure) (HCC)   Type 2 diabetes mellitus with complication (HCC)   Hypertension   Diverticulosis of colon without hemorrhage   Hyperlipidemia   Benign prostatic hyperplasia with urinary obstruction   Malnutrition of moderate degree  Sepsis due to gram-negative UTI (HCC) Tachycardia/Tachypnea + infection source - sepsis criteria without organ dysfunction  Ceftriaxone pending final UCx Monitor VS, fever curve, CBC, BMP  Femur fracture (HCC) s/p repair Pain control PT/OT  Chronic diastolic CHF (congestive heart failure) (HCC) Hypertension Hyperlipidemia CHF not in exacerbation Caution w/ IV fluids  Continue home ASA, statin, beta blocker  Type 2 diabetes mellitus with complication (HCC) Only on metformin at home A1C 7.2 2 mos ago - decent control, at goal for age  Hold metformin for now  Monitor Glc ac/hs x24 h Initiate SSI if needed  Benign prostatic hyperplasia with urinary obstruction Continue Myrbetriq, finasteride  Malnutrition of moderate degree Nutrition supplementation w/ Ensure   Dementia with behavioral disturbance (HCC) Agitation increased likely d/t sundowning and UTI Increased Seroquel Continue Namenda Redirect as able Sitter as needed Valium prn severe agitation which would pose safety concern    DVT prophylaxis: lovenox  Pertinent IV fluids/nutrition: no continuous IV fluids  Central lines / invasive devices: none  Code Status: DNR - advanced directive DNR form on file   Current Admission Status: inpatient   TOC needs / Dispo plan: none at this time Barriers to discharge / significant pending items: await UCx results to guide abx, await resolution of sepsis parameters

## 2022-08-26 NOTE — ED Notes (Addendum)
Pt had half of an ensure

## 2022-08-26 NOTE — ED Notes (Signed)
Family updated as to patient's status.

## 2022-08-26 NOTE — ED Provider Notes (Signed)
Patient has been boarding for the past 26+ hours in the ED pending TOC placement.  Staff tells me that patient has been sleeping all day and night, has not gotten up and not eaten or drank anything.  They tell me this evening that when they changed his brief, there was gross blood anteriorly over the diaper, around his urethral meatus.  I therefore order repeat labs and urinalysis which are distinctly different than the initial diagnostics done about 24 hours ago.  I am concerned about UTI, we will send his urine for a culture and start ceftriaxone.  He has noted to have a 1 point hemoglobin drop, possibly due to hematuria from cystitis.  CBC also with a neutrophilia that is new.  No other bleeding signs.  Metabolic panel is at baseline.  Will consult medicine for admission and further workup and management.   Vladimir Crofts, MD 08/26/22 [phone]

## 2022-08-26 NOTE — H&P (Signed)
HISTORY AND PHYSICAL    Jordan [name]   IRW:[id number] DOB: Apr 26, 1923   Date of Service: 08/26/22 Requesting physician/APP from ED: Treatment Team:  Attending Provider: Emeterio Reeve, DO  PCP: Asencion Noble, MD   Recently in hospital 08/15/22-08/22/22 for femur fracture after fall at his facility [facility]). He underwent right intramedullary nail intertrochanteric on 08/16/2022. DIscharged to SNF Rehab at [facility].   Returned to ED early yesterday AM approx 03:00 for "seizure like activity" per staff - states the patient was witnessed flickering his eye lids for about 30 seconds and he had not been sleeping since he arrived at that [facility] facility two days prior. There was no witnessed tonic-clonic movement, frothing at the mouth, eyes rolling in the top of his head, tongue biting, urinary incontinence or postictal state.  Patient only complaint was some soreness from a fall he had two ago. Was not sent for evaluation following that fall. No LOC. Daughter expressed frustration and displeasure at patient's current SNF due to fall and she did not like the care that he has been receiving there. ED placed consult for social work for alternate SNF placement.   As of early AM 08/26/22, patient had been boarding for the past 26+ hours in the ED pending TOC placement.  Staff alerted ED MD that patient has been sleeping all day and night, not gotten up and not eaten or drank anything, when they changed his brief, there was gross blood anteriorly over the diaper, around his urethral meatus.  ED MD ordered repeat labs and urinalysis which were distinctly different than the initial diagnostics done about 24 hours prior. Started ceftriaxone, consulted hospitalist for admission. HR and RR increased and UA concerning for UTI = sepsis. Admitted to hospitalist service, carryover 08/26/22 in the morning. Continuing abx pending cultures.      Consultants:   none  Procedures: none      ASSESSMENT & PLAN:   Principal Problem:   Sepsis due to gram-negative UTI (HCC) Active Problems:   Femur fracture (HCC)   Dementia with behavioral disturbance (HCC)   Chronic diastolic CHF (congestive heart failure) (HCC)   Type 2 diabetes mellitus with complication (HCC)   Hypertension   Diverticulosis of colon without hemorrhage   Hyperlipidemia   Benign prostatic hyperplasia with urinary obstruction   Malnutrition of moderate degree  Sepsis due to gram-negative UTI (HCC) Tachycardia/Tachypnea + infection source - sepsis criteria without organ dysfunction  Ceftriaxone pending UCx Monitor VS, fever curve, CBC, BMP  Femur fracture (HCC) s/p repair Pain control PT/OT  Chronic diastolic CHF (congestive heart failure) (HCC) Hypertension Hyperlipidemia CHF not in exacerbation Caution w/ IV fluids  Continue home ASA, statin, beta blocker  Type 2 diabetes mellitus with complication (HCC) Only on metformin at home A1C 7.2 2 mos ago - decent control, at goal for age  Hold metformin for now  Monitor Glc ac/hs x24 h Initiate SSI if needed  Benign prostatic hyperplasia with urinary obstruction Continue Myrbetriq, finasteride  Malnutrition of moderate degree Nutrition supplementation w/ Ensure   Dementia with behavioral disturbance (HCC) Continue Seroquel, Nemanda   DVT prophylaxis: lovenox  Pertinent IV fluids/nutrition: no continuous IV fluids  Central lines / invasive devices: none  Code Status: DNR - advanced directive DNR form on file   Current Admission Status: inpatient   TOC needs / Dispo plan: none at this time Barriers to discharge / significant pending items: await UCx results to guide abx, await resolution of sepsis parameters  Review of Systems:  Review of Systems  Constitutional:  Positive for chills, fever and malaise/fatigue.  Respiratory:  Negative for cough and shortness of  breath.   Cardiovascular:  Negative for chest pain and orthopnea.  Gastrointestinal:  Negative for abdominal pain, nausea and vomiting.  Genitourinary:  Positive for frequency and hematuria.  Musculoskeletal:  Negative for back pain and joint pain.  Neurological:  Positive for weakness. Negative for dizziness, tremors and focal weakness.  Psychiatric/Behavioral:  Negative for depression.        has a past medical history of Aortic stenosis, Arthritis, BPH (benign prostatic hypertrophy), CAD (coronary artery disease), Diverticulosis of colon, Essential hypertension, Fecal urgency, History of adenomatous polyp of colon, Hyperlipidemia, Lower urinary tract symptoms (LUTS), Type 2 diabetes mellitus ([place]), and Wears glasses.  No current facility-administered medications on file prior to encounter.   Current Outpatient Medications on File Prior to Encounter  Medication Sig Dispense Refill   acetaminophen (TYLENOL) 325 MG tablet Take 650 mg by mouth 3 (three) times daily.     aspirin 81 MG chewable tablet Chew 1 tablet (81 mg total) by mouth 2 (two) times daily. (0800)  0   atorvastatin (LIPITOR) 20 MG tablet Take 20 mg by mouth daily.     budesonide (ENTOCORT EC) 3 MG 24 hr capsule TAKE 1 CAPSULE BY MOUTH DAILY. (Patient taking differently: Take 3 mg by mouth in the morning. (0800)) 90 capsule 0   finasteride (PROSCAR) 5 MG tablet TAKE 1 TABLET BY MOUTH DAILY AS DIRECTED. (Patient taking differently: Take 5 mg by mouth daily.) 90 tablet 0   melatonin 5 MG TABS Take 5 mg by mouth at bedtime as needed (sleep).     memantine (NAMENDA) 10 MG tablet Take 10 mg by mouth 2 (two) times daily. (0800, 2000)     metoprolol tartrate (LOPRESSOR) 25 MG tablet Take 0.5 tablets (12.5 mg total) by mouth 2 (two) times daily. (Patient taking differently: Take 12.5 mg by mouth 2 (two) times daily. (0800, 2000)) 30 tablet 0   Multiple Vitamins-Minerals (PRESERVISION AREDS) TABS Take 1 tablet by mouth in the morning.  (0800)     QUEtiapine (SEROQUEL) 25 MG tablet Take 3 tablets (75 mg total) by mouth at bedtime. (Patient taking differently: Take 50 mg by mouth at bedtime. (2100)) 90 tablet 0   senna (SENOKOT) 8.6 MG TABS tablet Take 1 tablet (8.6 mg total) by mouth 2 (two) times daily. 60 tablet 0   feeding supplement (ENSURE ENLIVE / ENSURE PLUS) LIQD Take 237 mLs by mouth 3 (three) times daily after meals. 237 mL 12   metFORMIN (GLUCOPHAGE) 500 MG tablet Take 500 mg by mouth at bedtime. (2100) (Patient not taking: Reported on 08/25/2022)     mirabegron ER (MYRBETRIQ) 25 MG TB24 tablet Take 1 tablet (25 mg total) by mouth daily. (Patient not taking: Reported on 08/25/2022) 30 tablet 0   pravastatin (PRAVACHOL) 80 MG tablet Take 80 mg by mouth daily at 6 PM. (Patient not taking: Reported on 08/25/2022)       No Known Allergies    family history includes Heart disease in his brother. Past Surgical History:  Procedure Laterality Date   BIOPSY  10/02/2019   Procedure: BIOPSY;  Surgeon: Rogene [place], MD;  [facility];  Service: Endoscopy;;   CATARACT EXTRACTION W/ INTRAOCULAR LENS  IMPLANT, BILATERAL     COLONOSCOPY  last one 11/16/2014   polpectomy   CORONARY ANGIOPLASTY WITH STENT PLACEMENT  12-23-2007  dr  hochrein   (abnormal myoview)  PCI w/ DES x1 pRCA (95%),  pCFX 25%,  mCFX 40-50%,  after PDA 30%,  prox, mid, and ostial LAD  30% calcification,  LM diffuse 25% calcification,  ef 60%   EVALUATION UNDER ANESTHESIA WITH ANAL FISTULECTOMY [name] 06/03/2015   Procedure: ANAL EXAM UNDER ANESTHESIA ,FISTULOTOMY ;  Surgeon: [name], MD;  Location: [place];  Service: General;  Laterality: [name];   FLEXIBLE SIGMOIDOSCOPY [name] 10/02/2019   Procedure: FLEXIBLE SIGMOIDOSCOPY;  Surgeon: Rogene [place], MD;  [facility];  Service: Endoscopy;  Laterality: [name];  230   INGUINAL HERNIA REPAIR Right 2001   INTRAMEDULLARY (IM) NAIL INTERTROCHANTERIC Right 08/16/2022   Procedure:  INTRAMEDULLARY (IM) NAIL INTERTROCHANTERIC;  Surgeon: Lovell Sheehan, MD;  Location: [facility];  Service: Orthopedics;  Laterality: Right;   TRANSTHORACIC ECHOCARDIOGRAM  12-06-2007   normal LVF, ef 60%/  mild to moderate  AV calcification without stenosis,  mild AR and TR,  mild LAE          Objective Findings:  Vitals:   08/26/22 0730 08/26/22 0800 08/26/22 0817 08/26/22 0835  BP: (!) 121/42   (!) 136/59  Pulse: (!) 103   (!) 106  Resp: (!) 24   16  Temp:  (!) 102.6 F (39.2 C) 99.1 F (37.3 C) 98.2 F (36.8 C)  TempSrc:  Axillary Axillary   SpO2: 95%   100%  Height:        Intake/Output Summary (Last 24 hours) at 08/26/2022 1134 Last data filed at 08/26/2022 0620 Gross per 24 hour  Intake 100 ml  Output --  Net 100 ml   There were no vitals filed for this visit.  Examination:  Physical Exam Constitutional:      General: He is not in acute distress. Cardiovascular:     Rate and Rhythm: Regular rhythm. Tachycardia present.  Pulmonary:     Effort: Pulmonary effort is normal. No respiratory distress.     Breath sounds: Normal breath sounds.  Abdominal:     Palpations: Abdomen is soft.  Musculoskeletal:     Right lower leg: No edema.     Left lower leg: No edema.  Skin:    General: Skin is warm and dry.  Neurological:     General: No focal deficit present.     Mental Status: He is alert.  Psychiatric:        Mood and Affect: Mood normal.        Behavior: Behavior normal.          Scheduled Medications:   aspirin  81 mg Oral BID   budesonide  3 mg Oral Daily   docusate sodium  100 mg Oral BID   enoxaparin (LOVENOX) injection  40 mg Subcutaneous Q24H   feeding supplement  237 mL Oral TID PC   finasteride  5 mg Oral Daily   memantine  10 mg Oral BID   metoprolol tartrate  12.5 mg Oral BID   mirabegron ER  25 mg Oral Daily   multivitamin  1 tablet Oral Daily   pravastatin  80 mg Oral q1800   QUEtiapine  75 mg Oral QHS   senna  1 tablet Oral BID     Continuous Infusions:  [START ON 08/27/2022] cefTRIAXone (ROCEPHIN)  IV      PRN Medications:    Antimicrobials:  Anti-infectives (From admission, onward)    Start     Dose/Rate Route Frequency Ordered Stop  08/27/22 0800  cefTRIAXone (ROCEPHIN) 1 g in sodium chloride 0.9 % 100 mL IVPB        1 g 200 mL/hr over 30 Minutes Intravenous Every 24 hours 08/26/22 1114     08/26/22 0530  cefTRIAXone (ROCEPHIN) 1 g in sodium chloride 0.9 % 100 mL IVPB        1 g 200 mL/hr over 30 Minutes Intravenous  Once 08/26/22 0518 08/26/22 0620           Data Reviewed: I have personally reviewed following labs and imaging studies  CBC: Recent Labs  Lab 08/19/22 1725 08/25/22 0335 08/26/22 0427  WBC 9.1 7.6 8.9  NEUTROABS 7.4 5.9 7.8*  HGB 9.1* 10.0* 8.7*  HCT 28.6* 32.4* 28.0*  MCV 90.5 91.0 90.9  PLT 288 456* 342   Basic Metabolic Panel: Recent Labs  Lab 08/25/22 0335 08/26/22 0427  NA 139 137  K 3.6 4.0  CL 104 101  CO2 25 29  GLUCOSE 119* 130*  BUN 45* 37*  CREATININE 1.07 1.03  CALCIUM 8.4* 8.2*  MG 2.1  --    GFR: Estimated Creatinine Clearance: 31.1 mL/min (by C-G formula based on SCr of 1.03 mg/dL). Liver Function Tests: Recent Labs  Lab 08/25/22 0335  AST 34  ALT 14  ALKPHOS 82  BILITOT 1.0  PROT 6.4*  ALBUMIN 2.9*   No results for input(s): "LIPASE", "AMYLASE" in the last 168 hours. No results for input(s): "AMMONIA" in the last 168 hours. Coagulation Profile: No results for input(s): "INR", "PROTIME" in the last 168 hours. Cardiac Enzymes: No results for input(s): "CKTOTAL", "CKMB", "CKMBINDEX", "TROPONINI" in the last 168 hours. BNP (last 3 results) No results for input(s): "PROBNP" in the last 8760 hours. HbA1C: No results for input(s): "HGBA1C" in the last 72 hours. CBG: Recent Labs  Lab 08/20/22 2117 08/21/22 2356 08/22/22 0729 08/22/22 1146 08/25/22 1651  GLUCAP 140* 93 73 218* 111*   Lipid Profile: No results for input(s):  "CHOL", "HDL", "LDLCALC", "TRIG", "CHOLHDL", "LDLDIRECT" in the last 72 hours. Thyroid Function Tests: Recent Labs    08/25/22 0335  TSH 3.884  FREET4 1.05   Anemia Panel: No results for input(s): "VITAMINB12", "FOLATE", "FERRITIN", "TIBC", "IRON", "RETICCTPCT" in the last 72 hours. Most Recent Urinalysis On File:     Component Value Date/Time   COLORURINE BROWN (A) 08/26/2022 0422   APPEARANCEUR CLOUDY (A) 08/26/2022 0422   APPEARANCEUR Clear 03/17/2022 1240   LABSPEC 1.024 08/26/2022 0422   PHURINE  08/26/2022 0422    TEST NOT REPORTED DUE TO COLOR INTERFERENCE OF URINE PIGMENT   GLUCOSEU (A) 08/26/2022 0422    TEST NOT REPORTED DUE TO COLOR INTERFERENCE OF URINE PIGMENT   HGBUR (A) 08/26/2022 0422    TEST NOT REPORTED DUE TO COLOR INTERFERENCE OF URINE PIGMENT   BILIRUBINUR (A) 08/26/2022 0422    TEST NOT REPORTED DUE TO COLOR INTERFERENCE OF URINE PIGMENT   BILIRUBINUR negative 04/13/2022 1525   BILIRUBINUR Negative 03/17/2022 1240   KETONESUR (A) 08/26/2022 0422    TEST NOT REPORTED DUE TO COLOR INTERFERENCE OF URINE PIGMENT   PROTEINUR (A) 08/26/2022 0422    TEST NOT REPORTED DUE TO COLOR INTERFERENCE OF URINE PIGMENT   UROBILINOGEN 1.0 04/13/2022 1525   NITRITE (A) 08/26/2022 0422    TEST NOT REPORTED DUE TO COLOR INTERFERENCE OF URINE PIGMENT   LEUKOCYTESUR (A) 08/26/2022 0422    TEST NOT REPORTED DUE TO COLOR INTERFERENCE OF URINE PIGMENT   Sepsis Labs: @LABRCNTIP (procalcitonin:4,lacticidven:4)  Recent Results (from the past 240 hour(s))  Surgical PCR screen     Status: None   Collection Time: 08/16/22  3:20 PM   Specimen: Nasal Mucosa; Nasal Swab  Result Value Ref Range Status   MRSA, PCR NEGATIVE NEGATIVE Final   Staphylococcus aureus NEGATIVE NEGATIVE Final    Comment: (NOTE) The Xpert SA Assay (FDA approved for NASAL specimens in patients 30 years of age and older), is one component of a comprehensive surveillance program. It is not intended to diagnose  infection nor to guide or monitor treatment. Performed at [facility], [address], [place], [place] [address]          Radiology Studies: DG Pelvis 1-2 Views  Result Date: 08/25/2022 CLINICAL DATA:  89 year old male with recent fall and seizure like activity. Status post right hip ORIF on 08/16/2022. EXAM: PELVIS - 1-2 VIEW COMPARISON:  Intraoperative right hip ORIF images 13124. Pelvis radiograph 07/08/2022. FINDINGS: AP view at 0343 hours. Recent proximal right femur ORIF with overlying skin staples still in place. Visible hardware appears intact. Near anatomic alignment, aside from proximally displaced lesser trochanter comminution fragment. Stable bony pelvis compared to December. Left femoral head remains located. Extensive iliofemoral calcified atherosclerosis. Negative visible bowel gas. IMPRESSION: 1. Recent right femur ORIF, incompletely visible but no adverse features identified. 2. No superimposed acute pelvis fracture identified. 3. Extensive iliofemoral calcified atherosclerosis. Electronically Signed   By: Genevie Ann M.D.   On: 08/25/2022 04:07   DG Chest 1 View  Result Date: 08/25/2022 CLINICAL DATA:  89 year old male with recent fall and seizure like activity. EXAM: CHEST  1 VIEW COMPARISON:  Portable chest 08/15/2022 and earlier. FINDINGS: Portable AP supine view at 0344 hours. Stable to mildly improved lung volumes. Calcified aortic atherosclerosis. Other mediastinal contours are within normal limits. Visualized tracheal air column is within normal limits. Allowing for portable technique the lungs are clear. No pneumothorax or pleural effusion. No acute osseous abnormality identified. Negative visible bowel gas. IMPRESSION: 1. No acute cardiopulmonary abnormality or acute traumatic injury identified. 2. Aortic Atherosclerosis (ICD10-I70.0). Electronically Signed   By: Genevie Ann M.D.   On: 08/25/2022 04:05   CT Head Wo Contrast  Result Date: 08/25/2022 CLINICAL DATA:   Head trauma, minor (Age >= 65y) fall 2 days ago, head contusion EXAM: CT HEAD WITHOUT CONTRAST TECHNIQUE: Contiguous axial images were obtained from the base of the skull through the vertex without intravenous contrast. RADIATION DOSE REDUCTION: This exam was performed according to the departmental dose-optimization program which includes automated exposure control, adjustment of the mA and/or kV according to patient size and/or use of iterative reconstruction technique. COMPARISON:  08/11/2022 FINDINGS: Brain: Diffuse cerebral atrophy. Old right basal ganglia lacunar infarct. No acute intracranial abnormality. Specifically, no hemorrhage, hydrocephalus, mass lesion, acute infarction, or significant intracranial injury. Vascular: No hyperdense vessel or unexpected calcification. Skull: No acute calvarial abnormality. Sinuses/Orbits: No acute findings Other: None IMPRESSION: No acute intracranial abnormality. Electronically Signed   By: Rolm Baptise M.D.   On: 08/25/2022 03:52             LOS: 0 days    Time spent: 55 min    Emeterio Reeve, DO [facility] 08/26/2022, 11:34 AM    Dictation software may have been used to generate the above note. Typos may occur and escape review in typed/dictated notes. Please contact Dr Sheppard Coil directly for clarity if needed.  Staff may message me via secure chat in [place]  but this may not  receive an immediate response,  please page me for urgent matters!  If 7PM-7AM, please contact night coverage [email or web address]

## 2022-08-26 NOTE — ED Notes (Signed)
Urine appearance appears darker than previously. New labs to be obtained.

## 2022-08-27 DIAGNOSIS — A4150 Gram-negative sepsis, unspecified: Secondary | ICD-10-CM | POA: Diagnosis not present

## 2022-08-27 DIAGNOSIS — N390 Urinary tract infection, site not specified: Secondary | ICD-10-CM | POA: Diagnosis not present

## 2022-08-27 LAB — CBC
HCT: 23.4 % — ABNORMAL LOW (ref 39.0–52.0)
Hemoglobin: 7.4 g/dL — ABNORMAL LOW (ref 13.0–17.0)
MCH: 28.6 pg (ref 26.0–34.0)
MCHC: 31.6 g/dL (ref 30.0–36.0)
MCV: 90.3 fL (ref 80.0–100.0)
Platelets: 336 10*3/uL (ref 150–400)
RBC: 2.59 MIL/uL — ABNORMAL LOW (ref 4.22–5.81)
RDW: 14 % (ref 11.5–15.5)
WBC: 7 10*3/uL (ref 4.0–10.5)
nRBC: 0 % (ref 0.0–0.2)

## 2022-08-27 LAB — BASIC METABOLIC PANEL
Anion gap: 7 (ref 5–15)
BUN: 35 mg/dL — ABNORMAL HIGH (ref 8–23)
CO2: 25 mmol/L (ref 22–32)
Calcium: 7.9 mg/dL — ABNORMAL LOW (ref 8.9–10.3)
Chloride: 106 mmol/L (ref 98–111)
Creatinine, Ser: 0.9 mg/dL (ref 0.61–1.24)
GFR, Estimated: >60 mL/min (ref >60)
Glucose, Bld: 117 mg/dL — ABNORMAL HIGH (ref 70–99)
Potassium: 3.7 mmol/L (ref 3.5–5.1)
Sodium: 138 mmol/L (ref 135–145)

## 2022-08-27 LAB — GLUCOSE, CAPILLARY: Glucose-Capillary: 141 mg/dL — ABNORMAL HIGH (ref 70–99)

## 2022-08-27 MED ORDER — DIAZEPAM 5 MG/ML IJ SOLN
2.5000 mg | Freq: Three times a day (TID) | INTRAMUSCULAR | Status: DC | PRN
  Administered 2022-08-27: 2.5 mg via INTRAVENOUS
  Filled 2022-08-27: qty 2

## 2022-08-27 MED ORDER — QUETIAPINE FUMARATE 25 MG PO TABS
100.0000 mg | ORAL_TABLET | Freq: Every day | ORAL | Status: DC
  Administered 2022-08-27 – 2022-09-02 (×6): 100 mg via ORAL
  Filled 2022-08-27 (×7): qty 4

## 2022-08-27 NOTE — Plan of Care (Signed)
  Problem: Clinical Measurements: Goal: Ability to maintain clinical measurements within normal limits will improve Outcome: Progressing Goal: Will remain free from infection Outcome: Progressing Goal: Diagnostic test results will improve Outcome: Progressing Goal: Respiratory complications will improve Outcome: Progressing Goal: Cardiovascular complication will be avoided Outcome: Progressing   Problem: Activity: Goal: Risk for activity intolerance will decrease Outcome: Progressing   Problem: Nutrition: Goal: Adequate nutrition will be maintained Outcome: Progressing   Problem: Elimination: Goal: Will not experience complications related to bowel motility Outcome: Progressing   Problem: Pain Managment: Goal: General experience of comfort will improve Outcome: Progressing   Problem: Safety: Goal: Ability to remain free from injury will improve Outcome: Progressing   Problem: Skin Integrity: Goal: Risk for impaired skin integrity will decrease Outcome: Progressing

## 2022-08-27 NOTE — Evaluation (Signed)
Physical Therapy Evaluation Patient Details Name: Jordan [name] MRN: [id number] DOB: 1923/04/18 Today's Date: 08/27/2022  History of Present Illness  Recently in hospital 08/15/22-08/22/22 for femur fracture after fall at his facility [facility]). He underwent right intramedullary nail intertrochanteric on 08/16/2022. DIscharged to SNF Rehab at [facility].      Returned to ED early yesterday AM approx 03:00 for "seizure like activity" per staff - states the patient was witnessed flickering his eye lids for about 30 seconds and he had not been sleeping since he arrived at that [place] care facility two days prior. There was no witnessed tonic-clonic movement, frothing at the mouth, eyes rolling in the top of his head, tongue biting, urinary incontinence or postictal state.  Patient only complaint was some soreness from a fall he had two ago. Was not sent for evaluation following that fall. No LOC. Daughter expressed frustration and displeasure at patient's current SNF due to fall and she did not like the care that he has been receiving there. ED placed consult for social work for alternate SNF placement. Pt remains agitated.  Clinical Impression  Pt received in bed with sitter by his side and mittens on B hands 2/2 to agitation and pulling IV out. Pt remained confused and unable participate in conversation. Unable to follow commands well. PT with help of the sitter/NT found Bed mobility with max of 1, STS with Max of 2 and sit to supine with Max 1 and One person  for safety. Made pt comfortable with max of 2. Pt PLOF is no clear at this point. As per chart, daughter do not want pt back to [facility]. PT will continue while in acute care and will benefit form SNF when ready. Currently needs a Actuary.      Recommendations for follow up therapy are one component of a multi-disciplinary discharge planning process, led by the attending physician.  Recommendations may be updated based on  patient status, additional functional criteria and insurance authorization.  Follow Up Recommendations Skilled nursing-short term rehab (<3 hours/day) Can patient physically be transported by private vehicle: No    Assistance Recommended at Discharge Frequent or constant Supervision/Assistance  Patient can return home with the following  Two people to help with walking and/or transfers;Two people to help with bathing/dressing/bathroom;Assistance with cooking/housework;Assistance with feeding;Direct supervision/assist for medications management;Direct supervision/assist for financial management;Assist for transportation;Help with stairs or ramp for entrance    Equipment Recommendations None recommended by PT  Recommendations for Other Services       Functional Status Assessment Patient has had a recent decline in their functional status and demonstrates the ability to make significant improvements in function in a reasonable and predictable amount of time.     Precautions / Restrictions Precautions Precautions: Fall Precaution Comments: Per chart, pt is legally blind Restrictions Weight Bearing Restrictions: Yes RLE Weight Bearing: Weight bearing as tolerated      Mobility  Bed Mobility Overal bed mobility: Needs Assistance Bed Mobility: Supine to Sit, Sit to Supine     Supine to sit: Max assist Sit to supine: Max assist, +2 for safety/equipment   General bed mobility comments: poor postural control    Transfers Overall transfer level: Needs assistance Equipment used: Rolling walker (2 wheels) Transfers: Sit to/from Stand Sit to Stand: Max assist, +2 physical assistance           General transfer comment: unable achieve upright posture.    Ambulation/Gait  General Gait Details: Unable 2/2 to pt agitated  Stairs: [name]            Wheelchair Mobility: [name]    Modified Rankin (Stroke Patients Only)       Balance Overall balance  assessment: Needs assistance Sitting-balance support: Feet supported, Bilateral upper extremity supported Sitting balance-Leahy Scale: Poor       Standing balance-Leahy Scale: Poor Standing balance comment: unable achieve upright posture                             Pertinent Vitals/Pain Pain Assessment Pain Assessment: Faces Faces Pain Scale: Hurts little more Pain Location: back and R hip Pain Intervention(s): Monitored during session    Home Living Family/patient expects to be discharged to:: Skilled nursing facility Living Arrangements: Children                 Additional Comments: Pt is a poor hjisotiran 2/2 to cognitive impairment    Prior Function Prior Level of Function : Needs assist;Patient poor historian/Family not available;History of Falls (last six months)             Mobility Comments: As per recent prior admission: Pt states he walks at home, is unable to provided information regarding PLOF 2/2 baseline cognition. ADLs Comments: Chart indicates family has been assisting him with increasing supports over last 6 months.     Hand Dominance   Dominant Hand: Right    Extremity/Trunk Assessment   Upper Extremity Assessment Upper Extremity Assessment: Generalized weakness    Lower Extremity Assessment Lower Extremity Assessment: Generalized weakness;RLE deficits/detail RLE Deficits / Details: s/p R IM nail; WBAT noted buckling with weight bearing during functional mobility RLE: Unable to fully assess due to pain       Communication   Communication: HOH  Cognition Arousal/Alertness: Lethargic Behavior During Therapy: Flat affect, Agitated Overall Cognitive Status: Impaired/Different from baseline                                 General Comments: unoriented to place, time, situation and safety        General Comments      Exercises Other Exercises Other Exercises: Unable to follow commands consistently    Assessment/Plan    PT Assessment Patient needs continued PT services  PT Problem List Decreased strength;Decreased range of motion;Decreased activity tolerance;Decreased balance;Decreased mobility;Decreased coordination;Decreased cognition;Decreased knowledge of use of DME;Decreased safety awareness;Decreased knowledge of precautions;Decreased skin integrity       PT Treatment Interventions DME instruction;Gait training;Stair training;Functional mobility training;Therapeutic activities;Therapeutic exercise;Balance training;Neuromuscular re-education;Patient/family education;Cognitive remediation    PT Goals (Current goals can be found in the Care Plan section)  Acute Rehab PT Goals Patient Stated Goal: none stated PT Goal Formulation: With patient Time For Goal Achievement: 09/10/22 Potential to Achieve Goals: Fair    Frequency 7X/week     Co-evaluation               AM-PAC PT "6 Clicks" Mobility  Outcome Measure Help needed turning from your back to your side while in a flat bed without using bedrails?: Total Help needed moving from lying on your back to sitting on the side of a flat bed without using bedrails?: Total Help needed moving to and from a bed to a chair (including a wheelchair)?: Total Help needed standing up from a chair using your arms (e.g., wheelchair or bedside  chair)?: Total Help needed to walk in hospital room?: Total Help needed climbing 3-5 steps with a railing? : Total 6 Click Score: 6    End of Session Equipment Utilized During Treatment: Gait belt Activity Tolerance: Patient limited by lethargy;Other (comment) (and agitated) Patient left: in bed;with bed alarm set;with call bell/phone within reach;with nursing/sitter in room Nurse Communication: Mobility status PT Visit Diagnosis: Unsteadiness on feet (R26.81);Other abnormalities of gait and mobility (R26.89);Muscle weakness (generalized) (M62.81)    Time: 5927-6394 PT Time Calculation (min)  (ACUTE ONLY): 13 min   Charges:   PT Evaluation $PT Eval Moderate Complexity: 1 Mod         Reneisha Stilley PT DPT 2:50 PM,08/27/22

## 2022-08-27 NOTE — Progress Notes (Signed)
PROGRESS NOTE    Jordan [name]   HYI:[id number] DOB: 12-07-1922  DOA: 08/25/2022 Date of Service: 08/27/22 PCP: Asencion Noble, MD     Brief Narrative / Hospital Course:  Recently in hospital 08/15/22-08/22/22 for femur fracture after fall at his facility [facility] He underwent right intramedullary nail intertrochanteric on 08/16/2022. DIscharged to SNF Rehab at [facility].   Returned to ED early yesterday AM approx 03:00 for "seizure like activity" per staff - states the patient was witnessed flickering his eye lids for about 30 seconds and he had not been sleeping since he arrived at that [place] care facility two days prior. There was no witnessed tonic-clonic movement, frothing at the mouth, eyes rolling in the top of his head, tongue biting, urinary incontinence or postictal state.  Patient only complaint was some soreness from a fall he had two ago. Was not sent for evaluation following that fall. No LOC. Daughter expressed frustration and displeasure at patient's current SNF due to fall and she did not like the care that he has been receiving there. ED placed consult for social work for alternate SNF placement.   As of early AM 08/26/22, patient had been boarding for the past 26+ hours in the ED pending TOC placement.  Staff alerted ED MD that patient has been sleeping all day and night, not gotten up and not eaten or drank anything, when they changed his brief, there was gross blood anteriorly over the diaper, around his urethral meatus.  ED MD ordered repeat labs and urinalysis which were distinctly different than the initial diagnostics done about 24 hours prior. Started ceftriaxone, consulted hospitalist for admission. HR and RR increased and UA concerning for UTI = sepsis. Admitted to hospitalist service, carryover 08/26/22 in the morning. Continuing abx pending cultures.   Per RN very agitated and combative overnight, requiring sitter. UCx 02/11  (+)GNR     Consultants:  none  Procedures: none      ASSESSMENT & PLAN:   Principal Problem:   Sepsis due to gram-negative UTI (HCC) Active Problems:   Femur fracture (HCC)   Dementia with behavioral disturbance (HCC)   Chronic diastolic CHF (congestive heart failure) (HCC)   Type 2 diabetes mellitus with complication (HCC)   Hypertension   Diverticulosis of colon without hemorrhage   Hyperlipidemia   Benign prostatic hyperplasia with urinary obstruction   Malnutrition of moderate degree  Sepsis due to gram-negative UTI (HCC) Tachycardia/Tachypnea + infection source - sepsis criteria without organ dysfunction  Ceftriaxone pending final UCx Monitor VS, fever curve, CBC, BMP  Femur fracture (HCC) s/p repair Pain control PT/OT  Chronic diastolic CHF (congestive heart failure) (HCC) Hypertension Hyperlipidemia CHF not in exacerbation Caution w/ IV fluids  Continue home ASA, statin, beta blocker  Type 2 diabetes mellitus with complication (HCC) Only on metformin at home A1C 7.2 2 mos ago - decent control, at goal for age 89 Hold metformin for now  Monitor Glc ac/hs x24 h Initiate SSI if needed  Benign prostatic hyperplasia with urinary obstruction Continue Myrbetriq, finasteride  Malnutrition of moderate degree Nutrition supplementation w/ Ensure   Dementia with behavioral disturbance (HCC) Agitation increased likely d/t sundowning and UTI Increased Seroquel Continue Namenda Redirect as able Sitter as needed Valium prn severe agitation which would pose safety concern    DVT prophylaxis: lovenox  Pertinent IV fluids/nutrition: no continuous IV fluids  Central lines / invasive devices: none  Code Status: DNR - advanced directive DNR form on file  Current Admission Status: inpatient   TOC needs / Dispo plan: none at this time Barriers to discharge / significant pending items: await UCx results to guide abx, await resolution of sepsis parameters               Subjective / Brief ROS:  Patient reports no concerns but he is somnolent after meds for agitation were needed overnight   Family Communication: none at this time     Objective Findings:  Vitals:   08/26/22 1640 08/26/22 1800 08/27/22 0434 08/27/22 0836  BP: (!) 95/40 (!) 115/55 134/77 (!) 136/54  Pulse:  74 88 79  Resp:  16 18 16   Temp:  98.4 F (36.9 C) 98 F (36.7 C) 97.8 F (36.6 C)  TempSrc:  Oral    SpO2:  100% 91% 92%  Height:        Intake/Output Summary (Last 24 hours) at 08/27/2022 1159 Last data filed at 08/27/2022 1106 Gross per 24 hour  Intake 1340 ml  Output 670 ml  Net 670 ml   There were no vitals filed for this visit.  Examination:  Physical Exam Constitutional:      General: He is not in acute distress. Cardiovascular:     Rate and Rhythm: Normal rate. Rhythm irregular.     Heart sounds: Murmur heard.  Pulmonary:     Effort: Pulmonary effort is normal.     Breath sounds: Normal breath sounds.  Abdominal:     Palpations: Abdomen is soft.  Skin:    General: Skin is warm and dry.  Neurological:     General: No focal deficit present.     Mental Status: He is disoriented.  Psychiatric:        Behavior: Behavior normal.          Scheduled Medications:   aspirin  81 mg Oral BID   budesonide  3 mg Oral Daily   docusate  100 mg Oral Daily   enoxaparin (LOVENOX) injection  40 mg Subcutaneous Q24H   feeding supplement  237 mL Oral TID PC   memantine  10 mg Oral BID   mirabegron ER  25 mg Oral Daily   multivitamin  1 tablet Oral Daily   pravastatin  80 mg Oral q1800   QUEtiapine  100 mg Oral QHS   senna  1 tablet Oral BID    Continuous Infusions:  cefTRIAXone (ROCEPHIN)  IV 1 g (08/27/22 0919)   lactated ringers 75 mL/hr at 08/26/22 1814    PRN Medications:  acetaminophen **OR** acetaminophen, bisacodyl, diazepam, melatonin, oxyCODONE  Antimicrobials from admission:  Anti-infectives (From admission,  onward)    Start     Dose/Rate Route Frequency Ordered Stop   08/27/22 0800  cefTRIAXone (ROCEPHIN) 1 g in sodium chloride 0.9 % 100 mL IVPB        1 g 200 mL/hr over 30 Minutes Intravenous Every 24 hours 08/26/22 1114     08/26/22 0530  cefTRIAXone (ROCEPHIN) 1 g in sodium chloride 0.9 % 100 mL IVPB        1 g 200 mL/hr over 30 Minutes Intravenous  Once 08/26/22 0518 08/26/22 0349           Data Reviewed:  I have personally reviewed the following...  CBC: Recent Labs  Lab 08/25/22 0335 08/26/22 0427 08/26/22 1154 08/27/22 0421  WBC 7.6 8.9 16.0* 7.0  NEUTROABS 5.9 7.8*  --   --   HGB 10.0* 8.7* 8.1* 7.4*  HCT 32.4* 28.0*  25.5* 23.4*  MCV 91.0 90.9 91.4 90.3  PLT 456* 394 359 151   Basic Metabolic Panel: Recent Labs  Lab 08/25/22 0335 08/26/22 0427 08/26/22 1154 08/27/22 0421  NA 139 137  --  138  K 3.6 4.0  --  3.7  CL 104 101  --  106  CO2 25 29  --  25  GLUCOSE 119* 130*  --  117*  BUN 45* 37*  --  35*  CREATININE 1.07 1.03 1.00 0.90  CALCIUM 8.4* 8.2*  --  7.9*  MG 2.1  --   --   --    GFR: Estimated Creatinine Clearance: 35.6 mL/min (by C-G formula based on SCr of 0.9 mg/dL). Liver Function Tests: Recent Labs  Lab 08/25/22 0335  AST 34  ALT 14  ALKPHOS 82  BILITOT 1.0  PROT 6.4*  ALBUMIN 2.9*   No results for input(s): "LIPASE", "AMYLASE" in the last 168 hours. No results for input(s): "AMMONIA" in the last 168 hours. Coagulation Profile: No results for input(s): "INR", "PROTIME" in the last 168 hours. Cardiac Enzymes: No results for input(s): "CKTOTAL", "CKMB", "CKMBINDEX", "TROPONINI" in the last 168 hours. BNP (last 3 results) No results for input(s): "PROBNP" in the last 8760 hours. HbA1C: No results for input(s): "HGBA1C" in the last 72 hours. CBG: Recent Labs  Lab 08/22/22 0729 08/22/22 1146 08/25/22 1651 08/26/22 1749 08/26/22 2108  GLUCAP 73 218* 111* 148* 140*   Lipid Profile: No results for input(s): "CHOL", "HDL",  "LDLCALC", "TRIG", "CHOLHDL", "LDLDIRECT" in the last 72 hours. Thyroid Function Tests: Recent Labs    08/25/22 0335  TSH 3.884  FREET4 1.05   Anemia Panel: No results for input(s): "VITAMINB12", "FOLATE", "FERRITIN", "TIBC", "IRON", "RETICCTPCT" in the last 72 hours. Most Recent Urinalysis On File:     Component Value Date/Time   COLORURINE BROWN (A) 08/26/2022 0422   APPEARANCEUR CLOUDY (A) 08/26/2022 0422   APPEARANCEUR Clear 03/17/2022 1240   LABSPEC 1.024 08/26/2022 0422   PHURINE  08/26/2022 0422    TEST NOT REPORTED DUE TO COLOR INTERFERENCE OF URINE PIGMENT   GLUCOSEU (A) 08/26/2022 0422    TEST NOT REPORTED DUE TO COLOR INTERFERENCE OF URINE PIGMENT   HGBUR (A) 08/26/2022 0422    TEST NOT REPORTED DUE TO COLOR INTERFERENCE OF URINE PIGMENT   BILIRUBINUR (A) 08/26/2022 0422    TEST NOT REPORTED DUE TO COLOR INTERFERENCE OF URINE PIGMENT   BILIRUBINUR negative 04/13/2022 1525   BILIRUBINUR Negative 03/17/2022 1240   KETONESUR (A) 08/26/2022 0422    TEST NOT REPORTED DUE TO COLOR INTERFERENCE OF URINE PIGMENT   PROTEINUR (A) 08/26/2022 0422    TEST NOT REPORTED DUE TO COLOR INTERFERENCE OF URINE PIGMENT   UROBILINOGEN 1.0 04/13/2022 1525   NITRITE (A) 08/26/2022 0422    TEST NOT REPORTED DUE TO COLOR INTERFERENCE OF URINE PIGMENT   LEUKOCYTESUR (A) 08/26/2022 0422    TEST NOT REPORTED DUE TO COLOR INTERFERENCE OF URINE PIGMENT   Sepsis Labs: @LABRCNTIP (procalcitonin:4,lacticidven:4) Microbiology: Recent Results (from the past 240 hour(s))  Urine Culture (for pregnant, neutropenic or urologic patients or patients with an indwelling urinary catheter)     Status: Abnormal (Preliminary result)   Collection Time: 08/26/22  4:21 AM   Specimen: Urine, Clean Catch  Result Value Ref Range Status   Specimen Description   Final    URINE, CLEAN CATCH Performed at [facility], [address] [place], [place] [address]    Special Requests   Final  NONE Performed at [facility], [place], [address] [place], [place] [address]    Culture (A)  Final    >=100,000 COLONIES/mL Lonell Grandchild NEGATIVE RODS SUSCEPTIBILITIES TO FOLLOW Performed at [facility], [place] [address] [place], [place] [address]    Report Status PENDING  Incomplete      Radiology Studies last 3 days: DG Pelvis 1-2 Views  Result Date: 08/25/2022 CLINICAL DATA:  89 year old male with recent fall and seizure like activity. Status post right hip ORIF on 08/16/2022. EXAM: PELVIS - 1-2 VIEW COMPARISON:  Intraoperative right hip ORIF images [id number]. Pelvis radiograph 07/08/2022. FINDINGS: AP view at 0343 hours. Recent proximal right femur ORIF with overlying skin staples still in place. Visible hardware appears intact. Near anatomic alignment, aside from proximally displaced lesser trochanter comminution fragment. Stable bony pelvis compared to December. Left femoral head remains located. Extensive iliofemoral calcified atherosclerosis. Negative visible bowel gas. IMPRESSION: 1. Recent right femur ORIF, incompletely visible but no adverse features identified. 2. No superimposed acute pelvis fracture identified. 3. Extensive iliofemoral calcified atherosclerosis. Electronically Signed   By: Genevie Ann M.D.   On: 08/25/2022 04:07   DG Chest 1 View  Result Date: 08/25/2022 CLINICAL DATA:  89 year old male with recent fall and seizure like activity. EXAM: CHEST  1 VIEW COMPARISON:  Portable chest 08/15/2022 and earlier. FINDINGS: Portable AP supine view at 0344 hours. Stable to mildly improved lung volumes. Calcified aortic atherosclerosis. Other mediastinal contours are within normal limits. Visualized tracheal air column is within normal limits. Allowing for portable technique the lungs are clear. No pneumothorax or pleural effusion. No acute osseous abnormality identified. Negative visible bowel gas. IMPRESSION: 1. No acute cardiopulmonary abnormality or acute traumatic injury  identified. 2. Aortic Atherosclerosis (ICD10-I70.0). Electronically Signed   By: Genevie Ann M.D.   On: 08/25/2022 04:05   CT Head Wo Contrast  Result Date: 08/25/2022 CLINICAL DATA:  Head trauma, minor (Age >= 65y) fall 2 days ago, head contusion EXAM: CT HEAD WITHOUT CONTRAST TECHNIQUE: Contiguous axial images were obtained from the base of the skull through the vertex without intravenous contrast. RADIATION DOSE REDUCTION: This exam was performed according to the departmental dose-optimization program which includes automated exposure control, adjustment of the mA and/or kV according to patient size and/or use of iterative reconstruction technique. COMPARISON:  08/11/2022 FINDINGS: Brain: Diffuse cerebral atrophy. Old right basal ganglia lacunar infarct. No acute intracranial abnormality. Specifically, no hemorrhage, hydrocephalus, mass lesion, acute infarction, or significant intracranial injury. Vascular: No hyperdense vessel or unexpected calcification. Skull: No acute calvarial abnormality. Sinuses/Orbits: No acute findings Other: None IMPRESSION: No acute intracranial abnormality. Electronically Signed   By: Rolm Baptise M.D.   On: 08/25/2022 03:52             LOS: 1 day       Emeterio Reeve, DO [facility] 08/27/2022, 11:59 AM    Dictation software may have been used to generate the above note. Typos may occur and escape review in typed/dictated notes. Please contact Dr Sheppard Coil directly for clarity if needed.  Staff may message me via secure chat in [place]  but this may not receive an immediate response,  please page me for urgent matters!  If 7PM-7AM, please contact night coverage [email or web address]

## 2022-08-28 DIAGNOSIS — N390 Urinary tract infection, site not specified: Secondary | ICD-10-CM | POA: Diagnosis not present

## 2022-08-28 DIAGNOSIS — A4150 Gram-negative sepsis, unspecified: Secondary | ICD-10-CM | POA: Diagnosis not present

## 2022-08-28 LAB — URINE CULTURE: Culture: >=100000 — AB

## 2022-08-28 LAB — CBC
HCT: 26.7 % — ABNORMAL LOW (ref 39.0–52.0)
Hemoglobin: 8.5 g/dL — ABNORMAL LOW (ref 13.0–17.0)
MCH: 28.6 pg (ref 26.0–34.0)
MCHC: 31.8 g/dL (ref 30.0–36.0)
MCV: 89.9 fL (ref 80.0–100.0)
Platelets: 388 10*3/uL (ref 150–400)
RBC: 2.97 MIL/uL — ABNORMAL LOW (ref 4.22–5.81)
RDW: 13.8 % (ref 11.5–15.5)
WBC: 6.4 10*3/uL (ref 4.0–10.5)
nRBC: 0 % (ref 0.0–0.2)

## 2022-08-28 MED ORDER — QUETIAPINE FUMARATE 25 MG PO TABS
50.0000 mg | ORAL_TABLET | Freq: Two times a day (BID) | ORAL | Status: DC
  Administered 2022-08-29 – 2022-09-07 (×15): 50 mg via ORAL
  Filled 2022-08-28 (×15): qty 2

## 2022-08-28 NOTE — Progress Notes (Signed)
Physical Therapy Treatment Patient Details Name: Jordan [name] MRN: [id number] DOB: Jul 07, 1923 Today's Date: 08/28/2022   History of Present Illness Recently in hospital 08/15/22-08/22/22 for femur fracture after fall at his facility [name]). He underwent right intramedullary nail intertrochanteric on 08/16/2022. DIscharged to SNF Rehab at [name].      Returned to ED early yesterday AM approx 03:00 for "seizure like activity" per staff - states the patient was witnessed flickering his eye lids for about 30 seconds and he had not been sleeping since he arrived at that [name] care facility two days prior. There was no witnessed tonic-clonic movement, frothing at the mouth, eyes rolling in the top of his head, tongue biting, urinary incontinence or postictal state.  Patient only complaint was some soreness from a fall he had two ago. Was not sent for evaluation following that fall. No LOC. Daughter expressed frustration and displeasure at patient's current SNF due to fall and she did not like the care that he has been receiving there. ED placed consult for social work for alternate SNF placement. Pt remains agitated.    PT Comments    Pt received in bed this pm, very lethargic with difficulty keeping eyes open during B LE ROM/strengthening. Pt declined out of bed due to fatigue. Pt repositioned and returned to resting quietly. No sitter or mitts on. Pt pleasant during session, however limited tolerance for PT. Will re-attempt in am.   Recommendations for follow up therapy are one component of a multi-disciplinary discharge planning process, led by the attending physician.  Recommendations may be updated based on patient status, additional functional criteria and insurance authorization.  Follow Up Recommendations  Skilled nursing-short term rehab (<3 hours/day) Can patient physically be transported by private vehicle: No   Assistance Recommended at Discharge Frequent  or constant Supervision/Assistance  Patient can return home with the following Two people to help with walking and/or transfers;Two people to help with bathing/dressing/bathroom;Assistance with cooking/housework;Assistance with feeding;Direct supervision/assist for medications management;Direct supervision/assist for financial management;Assist for transportation;Help with stairs or ramp for entrance   Equipment Recommendations  None recommended by PT    Recommendations for Other Services       Precautions / Restrictions Precautions Precautions: Fall Precaution Comments: Per chart, pt is legally blind Restrictions Weight Bearing Restrictions: No RLE Weight Bearing: Weight bearing as tolerated     Mobility  Bed Mobility                    Transfers                        Ambulation/Gait                   Stairs             Wheelchair Mobility    Modified Rankin (Stroke Patients Only)       Balance                                            Cognition Arousal/Alertness: Lethargic Behavior During Therapy: Flat affect Overall Cognitive Status: No family/caregiver present to determine baseline cognitive functioning                                 General Comments: unoriented  to place, time, situation and safety        Exercises General Exercises - Lower Extremity Ankle Circles/Pumps: AAROM, Both, 15 reps Heel Slides: AAROM, Both, 10 reps Hip ABduction/ADduction: AAROM, Right, 10 reps Straight Leg Raises: AAROM, Right, 5 reps Hip Flexion/Marching: AAROM, Both, 10 reps    General Comments General comments (skin integrity, edema, etc.):  (Right hip dressing intact. Pt very lethargic, declining out of bed at time of visit. Kept eyes open briefly during ROM and repositioning)      Pertinent Vitals/Pain Pain Assessment Pain Assessment: Faces Faces Pain Scale: Hurts a little bit Pain Location: Right  knee Pain Descriptors / Indicators: Aching, Discomfort Pain Intervention(s): Limited activity within patient's tolerance    Home Living                          Prior Function            PT Goals (current goals can now be found in the care plan section) Acute Rehab PT Goals Patient Stated Goal: none stated    Frequency    7X/week      PT Plan Current plan remains appropriate    Co-evaluation              AM-PAC PT "6 Clicks" Mobility   Outcome Measure  Help needed turning from your back to your side while in a flat bed without using bedrails?: Total Help needed moving from lying on your back to sitting on the side of a flat bed without using bedrails?: Total Help needed moving to and from a bed to a chair (including a wheelchair)?: Total Help needed standing up from a chair using your arms (e.g., wheelchair or bedside chair)?: Total Help needed to walk in hospital room?: Total Help needed climbing 3-5 steps with a railing? : Total 6 Click Score: 6    End of Session   Activity Tolerance: Patient limited by lethargy;Other (comment) Patient left: in bed;with call bell/phone within reach;with bed alarm set Nurse Communication: Mobility status;Other (comment) (Fatigue limiting mobility) PT Visit Diagnosis: Unsteadiness on feet (R26.81);Other abnormalities of gait and mobility (R26.89);Muscle weakness (generalized) (M62.81)     Time: 1440-1450 PT Time Calculation (min) (ACUTE ONLY): 10 min  Charges:  $Therapeutic Exercise: 8-22 mins                    [name], PTA  [name] 08/28/2022, 3:04 PM

## 2022-08-28 NOTE — Plan of Care (Signed)
  Problem: Clinical Measurements: Goal: Ability to maintain clinical measurements within normal limits will improve Outcome: Progressing Goal: Will remain free from infection Outcome: Progressing Goal: Diagnostic test results will improve Outcome: Progressing Goal: Respiratory complications will improve Outcome: Progressing Goal: Cardiovascular complication will be avoided Outcome: Progressing   Problem: Nutrition: Goal: Adequate nutrition will be maintained Outcome: Progressing   Problem: Coping: Goal: Level of anxiety will decrease Outcome: Progressing   Problem: Elimination: Goal: Will not experience complications related to bowel motility Outcome: Progressing Goal: Will not experience complications related to urinary retention Outcome: Progressing   Problem: Safety: Goal: Ability to remain free from injury will improve Outcome: Progressing   Problem: Skin Integrity: Goal: Risk for impaired skin integrity will decrease Outcome: Progressing

## 2022-08-28 NOTE — TOC Progression Note (Signed)
Transition of Care [facility]) - Progression Note    Patient Details  Name: Jordan [name] MRN: [id number] Date of Birth: December 25, 1922  Transition of Care [facility]) CM/SW [place], RN Phone Number: 08/28/2022, 11:28 AM  Clinical Narrative:    The patient will have to be Mitten and sitter free for 48 hours to go to SNF No bed offers at this time, I resent out the bedsearch   Expected Discharge Plan: [place] Barriers to Discharge: SNF Pending bed offer  Expected Discharge Plan and Services   Discharge Planning Services: CM Consult Post Acute Care Choice: [place] Living arrangements for the past 2 months: Single Family Home                 DME Arranged: [name] DME Agency: NA       HH Arranged: NA           Social Determinants of Health (SDOH) Interventions SDOH Screenings   Food Insecurity: No Food Insecurity (08/26/2022)  Housing: Low Risk  (08/26/2022)  Transportation Needs: No Transportation Needs (08/26/2022)  Utilities: Not At Risk (08/26/2022)  Tobacco Use: Medium Risk (08/26/2022)    Readmission Risk Interventions    07/18/2022    2:18 PM 07/14/2022   11:46 AM 07/11/2022   12:11 PM  Readmission Risk Prevention Plan  Transportation Screening  Complete Complete  PCP or Specialist Appt within 3-5 Days  Complete Complete  HRI or [place]  Complete Complete  Social Work Consult for [place] Planning/Counseling  Complete Complete  Palliative Care Screening  Not Applicable Not Applicable  Medication Review Press photographer)  Complete Complete  PCP or Specialist appointment within 3-5 days of discharge Complete    HRI or [place] Complete    SW Recovery Care/Counseling Consult Complete    [place] Not Applicable

## 2022-08-28 NOTE — Progress Notes (Signed)
PROGRESS NOTE    Jordan [name]   UUV:[id number] DOB: 1922-12-04  DOA: 08/25/2022 Date of Service: 08/28/22 PCP: Asencion Noble, MD     Brief Narrative / Hospital Course:  Recently in hospital 08/15/22-08/22/22 for femur fracture after fall at his facility [facility]). He underwent right intramedullary nail intertrochanteric on 08/16/2022. DIscharged to SNF Rehab at [facility].   Returned to ED early AM approx 03:00 for "seizure like activity" per staff - states the patient was witnessed flickering his eye lids for about 30 seconds and he had not been sleeping since he arrived at that [place] care facility two days prior. There was no witnessed tonic-clonic movement, frothing at the mouth, eyes rolling in the top of his head, tongue biting, urinary incontinence or postictal state.  Patient only complaint was some soreness from a fall he had two ago. Was not sent for evaluation following that fall. No LOC. Daughter expressed frustration and displeasure at patient's current SNF due to fall and she did not like the care that he has been receiving there. ED placed consult for social work for alternate SNF placement.   As of early AM 08/26/22, patient had been boarding for the past 26+ hours in the ED pending TOC placement.  Staff alerted ED MD that patient has been sleeping all day and night, not gotten up and not eaten or drank anything, when they changed his brief, there was gross blood anteriorly over the diaper, around his urethral meatus.  ED MD ordered repeat labs and urinalysis which were distinctly different than the initial diagnostics done about 24 hours prior. Started ceftriaxone, consulted hospitalist for admission. HR and RR increased and UA concerning for UTI = sepsis.  02/10: Admitted to hospitalist service. Continuing abx pending cultures. 02/1: Per RN very agitated and combative overnight, requiring sitter. UCx 02/11 (+)GNR 02/12: remains confused, needs to be  without sitter or mittens 48h for SNF to accept back. Increased seroquel, trial d/c sitter today. UCx(+) Enterobacter aerogenes (S)ceftriaxone, continue IV abx while here      Consultants:  none  Procedures: none      ASSESSMENT & PLAN:   Principal Problem:   Sepsis due to gram-negative UTI ([place]) Active Problems:   Femur fracture (HCC)   Dementia with behavioral disturbance (HCC)   Chronic diastolic CHF (congestive heart failure) (HCC)   Type 2 diabetes mellitus with complication (HCC)   Hypertension   Diverticulosis of colon without hemorrhage   Hyperlipidemia   Benign prostatic hyperplasia with urinary obstruction   Malnutrition of moderate degree  Sepsis due to Enterobacter aerogenes ([place]) - sepsis parameters have resolved Ceftriaxone while here, can plan to d/c on po cipro  Monitor VS, fever curve, CBC, BMP  Femur fracture (HCC) s/p repair Pain control PT/OT  Chronic diastolic CHF (congestive heart failure) ([place]) Hypertension Hyperlipidemia CHF not in exacerbation Caution w/ IV fluids  Continue home ASA, statin, beta blocker  Type 2 diabetes mellitus with complication (HCC) Only on metformin at home A1C 7.2 2 mos ago - decent control, at goal for age  Hold metformin for now  Monitor Glc ac/hs x24 h Initiate SSI if needed  Benign prostatic hyperplasia with urinary obstruction Continue Myrbetriq, finasteride  Malnutrition of moderate degree Nutrition supplementation w/ Ensure   Dementia with behavioral disturbance (HCC) Agitation increased likely d/t sundowning and UTI Increased Seroquel Continue Namenda Redirect as able   DVT prophylaxis: lovenox  Pertinent IV fluids/nutrition: no continuous IV fluids  Central lines / invasive  devices: none  Code Status: DNR - advanced directive DNR form on file   Current Admission Status: inpatient   TOC needs / Dispo plan: SNF - last sitter/mitts approx 12:00 today 08/28/22  Barriers to discharge /  significant pending items: await UCx results to guide abx, await resolution of sepsis parameters              Subjective / Brief ROS:  Patient reports no concerns but poor historian. Sitter at bedside on AM rounds states he has been fairly calm but confused    Family Communication: none at this time     Objective Findings:  Vitals:   08/27/22 2255 08/28/22 0733 08/28/22 0809 08/28/22 0902  BP: (!) 160/69 (!) 151/63 138/60   Pulse: 91 74 70   Resp:  18 14   Temp: 98.7 F (37.1 C) 98.5 F (36.9 C) (!) 97.5 F (36.4 C) 98 F (36.7 C)  TempSrc: Oral  Axillary Axillary  SpO2: 99% 100% 98%   Height:        Intake/Output Summary (Last 24 hours) at 08/28/2022 1335 Last data filed at 08/28/2022 1145 Gross per 24 hour  Intake 480 ml  Output 1800 ml  Net -1320 ml   There were no vitals filed for this visit.  Examination:  Physical Exam Constitutional:      General: He is not in acute distress. Cardiovascular:     Rate and Rhythm: Normal rate. Rhythm irregular.     Heart sounds: Murmur heard.  Pulmonary:     Effort: Pulmonary effort is normal.     Breath sounds: Normal breath sounds.  Abdominal:     Palpations: Abdomen is soft.  Skin:    General: Skin is warm and dry.  Neurological:     General: No focal deficit present.     Mental Status: He is disoriented.  Psychiatric:        Behavior: Behavior normal.          Scheduled Medications:   aspirin  81 mg Oral BID   budesonide  3 mg Oral Daily   docusate  100 mg Oral Daily   enoxaparin (LOVENOX) injection  40 mg Subcutaneous Q24H   feeding supplement  237 mL Oral TID PC   memantine  10 mg Oral BID   mirabegron ER  25 mg Oral Daily   multivitamin  1 tablet Oral Daily   pravastatin  80 mg Oral q1800   QUEtiapine  100 mg Oral QHS   [START ON 08/29/2022] QUEtiapine  50 mg Oral BID WC   senna  1 tablet Oral BID    Continuous Infusions:  cefTRIAXone (ROCEPHIN)  IV 1 g (08/28/22 0900)    PRN  Medications:  acetaminophen **OR** acetaminophen, bisacodyl, melatonin, oxyCODONE  Antimicrobials from admission:  Anti-infectives (From admission, onward)    Start     Dose/Rate Route Frequency Ordered Stop   08/27/22 0800  cefTRIAXone (ROCEPHIN) 1 g in sodium chloride 0.9 % 100 mL IVPB        1 g 200 mL/hr over 30 Minutes Intravenous Every 24 hours 08/26/22 1114     08/26/22 0530  cefTRIAXone (ROCEPHIN) 1 g in sodium chloride 0.9 % 100 mL IVPB        1 g 200 mL/hr over 30 Minutes Intravenous  Once 08/26/22 0518 08/26/22 7169           Data Reviewed:  I have personally reviewed the following...  CBC: Recent Labs  Lab 08/25/22 0335  08/26/22 0427 08/26/22 1154 08/27/22 0421 08/28/22 0515  WBC 7.6 8.9 16.0* 7.0 6.4  NEUTROABS 5.9 7.8*  --   --   --   HGB 10.0* 8.7* 8.1* 7.4* 8.5*  HCT 32.4* 28.0* 25.5* 23.4* 26.7*  MCV 91.0 90.9 91.4 90.3 89.9  PLT 456* 394 359 336 332   Basic Metabolic Panel: Recent Labs  Lab 08/25/22 0335 08/26/22 0427 08/26/22 1154 08/27/22 0421  NA 139 137  --  138  K 3.6 4.0  --  3.7  CL 104 101  --  106  CO2 25 29  --  25  GLUCOSE 119* 130*  --  117*  BUN 45* 37*  --  35*  CREATININE 1.07 1.03 1.00 0.90  CALCIUM 8.4* 8.2*  --  7.9*  MG 2.1  --   --   --    GFR: Estimated Creatinine Clearance: 35.6 mL/min (by C-G formula based on SCr of 0.9 mg/dL). Liver Function Tests: Recent Labs  Lab 08/25/22 0335  AST 34  ALT 14  ALKPHOS 82  BILITOT 1.0  PROT 6.4*  ALBUMIN 2.9*   No results for input(s): "LIPASE", "AMYLASE" in the last 168 hours. No results for input(s): "AMMONIA" in the last 168 hours. Coagulation Profile: No results for input(s): "INR", "PROTIME" in the last 168 hours. Cardiac Enzymes: No results for input(s): "CKTOTAL", "CKMB", "CKMBINDEX", "TROPONINI" in the last 168 hours. BNP (last 3 results) No results for input(s): "PROBNP" in the last 8760 hours. HbA1C: No results for input(s): "HGBA1C" in the last 72  hours. CBG: Recent Labs  Lab 08/22/22 1146 08/25/22 1651 08/26/22 1749 08/26/22 2108 08/27/22 2141  GLUCAP 218* 111* 148* 140* 141*   Lipid Profile: No results for input(s): "CHOL", "HDL", "LDLCALC", "TRIG", "CHOLHDL", "LDLDIRECT" in the last 72 hours. Thyroid Function Tests: No results for input(s): "TSH", "T4TOTAL", "FREET4", "T3FREE", "THYROIDAB" in the last 72 hours.  Anemia Panel: No results for input(s): "VITAMINB12", "FOLATE", "FERRITIN", "TIBC", "IRON", "RETICCTPCT" in the last 72 hours. Most Recent Urinalysis On File:     Component Value Date/Time   COLORURINE BROWN (A) 08/26/2022 0422   APPEARANCEUR CLOUDY (A) 08/26/2022 0422   APPEARANCEUR Clear 03/17/2022 1240   LABSPEC 1.024 08/26/2022 0422   PHURINE  08/26/2022 0422    TEST NOT REPORTED DUE TO COLOR INTERFERENCE OF URINE PIGMENT   GLUCOSEU (A) 08/26/2022 0422    TEST NOT REPORTED DUE TO COLOR INTERFERENCE OF URINE PIGMENT   HGBUR (A) 08/26/2022 0422    TEST NOT REPORTED DUE TO COLOR INTERFERENCE OF URINE PIGMENT   BILIRUBINUR (A) 08/26/2022 0422    TEST NOT REPORTED DUE TO COLOR INTERFERENCE OF URINE PIGMENT   BILIRUBINUR negative 04/13/2022 1525   BILIRUBINUR Negative 03/17/2022 1240   KETONESUR (A) 08/26/2022 0422    TEST NOT REPORTED DUE TO COLOR INTERFERENCE OF URINE PIGMENT   PROTEINUR (A) 08/26/2022 0422    TEST NOT REPORTED DUE TO COLOR INTERFERENCE OF URINE PIGMENT   UROBILINOGEN 1.0 04/13/2022 1525   NITRITE (A) 08/26/2022 0422    TEST NOT REPORTED DUE TO COLOR INTERFERENCE OF URINE PIGMENT   LEUKOCYTESUR (A) 08/26/2022 0422    TEST NOT REPORTED DUE TO COLOR INTERFERENCE OF URINE PIGMENT   Sepsis Labs: @LABRCNTIP (procalcitonin:4,lacticidven:4) Microbiology: Recent Results (from the past 240 hour(s))  Urine Culture (for pregnant, neutropenic or urologic patients or patients with an indwelling urinary catheter)     Status: Abnormal   Collection Time: 08/26/22  4:21 AM   Specimen: Urine, Clean  Catch  Result Value Ref Range Status   Specimen Description   Final    URINE, CLEAN CATCH Performed at [facility], [address] [place], [place] [address]    Special Requests   Final    NONE Performed at [facility], [address] [place],  [address]    Culture >=100,000 COLONIES/mL ENTEROBACTER AEROGENES (A)  Final   Report Status 08/28/2022 FINAL  Final   Organism ID, Bacteria ENTEROBACTER AEROGENES (A)  Final      Susceptibility   Enterobacter aerogenes - MIC*    CEFAZOLIN >=64 RESISTANT Resistant     CEFEPIME <=0.12 SENSITIVE Sensitive     CEFTRIAXONE <=0.25 SENSITIVE Sensitive     CIPROFLOXACIN <=0.25 SENSITIVE Sensitive     GENTAMICIN <=1 SENSITIVE Sensitive     IMIPENEM 1 SENSITIVE Sensitive     NITROFURANTOIN 128 RESISTANT Resistant     TRIMETH/SULFA <=20 SENSITIVE Sensitive     PIP/TAZO <=4 SENSITIVE Sensitive     * >=100,000 COLONIES/mL ENTEROBACTER AEROGENES      Radiology Studies last 3 days: DG Pelvis 1-2 Views  Result Date: 08/25/2022 CLINICAL DATA:  89 year old male with recent fall and seizure like activity. Status post right hip ORIF on 08/16/2022. EXAM: PELVIS - 1-2 VIEW COMPARISON:  Intraoperative right hip ORIF images 13124. Pelvis radiograph 07/08/2022. FINDINGS: AP view at 0343 hours. Recent proximal right femur ORIF with overlying skin staples still in place. Visible hardware appears intact. Near anatomic alignment, aside from proximally displaced lesser trochanter comminution fragment. Stable bony pelvis compared to December. Left femoral head remains located. Extensive iliofemoral calcified atherosclerosis. Negative visible bowel gas. IMPRESSION: 1. Recent right femur ORIF, incompletely visible but no adverse features identified. 2. No superimposed acute pelvis fracture identified. 3. Extensive iliofemoral calcified atherosclerosis. Electronically Signed   By: Genevie Ann M.D.   On: 08/25/2022 04:07   DG Chest 1 View  Result  Date: 08/25/2022 CLINICAL DATA:  89 year old male with recent fall and seizure like activity. EXAM: CHEST  1 VIEW COMPARISON:  Portable chest 08/15/2022 and earlier. FINDINGS: Portable AP supine view at 0344 hours. Stable to mildly improved lung volumes. Calcified aortic atherosclerosis. Other mediastinal contours are within normal limits. Visualized tracheal air column is within normal limits. Allowing for portable technique the lungs are clear. No pneumothorax or pleural effusion. No acute osseous abnormality identified. Negative visible bowel gas. IMPRESSION: 1. No acute cardiopulmonary abnormality or acute traumatic injury identified. 2. Aortic Atherosclerosis (ICD10-I70.0). Electronically Signed   By: Genevie Ann M.D.   On: 08/25/2022 04:05   CT Head Wo Contrast  Result Date: 08/25/2022 CLINICAL DATA:  Head trauma, minor (Age >= 65y) fall 2 days ago, head contusion EXAM: CT HEAD WITHOUT CONTRAST TECHNIQUE: Contiguous axial images were obtained from the base of the skull through the vertex without intravenous contrast. RADIATION DOSE REDUCTION: This exam was performed according to the departmental dose-optimization program which includes automated exposure control, adjustment of the mA and/or kV according to patient size and/or use of iterative reconstruction technique. COMPARISON:  08/11/2022 FINDINGS: Brain: Diffuse cerebral atrophy. Old right basal ganglia lacunar infarct. No acute intracranial abnormality. Specifically, no hemorrhage, hydrocephalus, mass lesion, acute infarction, or significant intracranial injury. Vascular: No hyperdense vessel or unexpected calcification. Skull: No acute calvarial abnormality. Sinuses/Orbits: No acute findings Other: None IMPRESSION: No acute intracranial abnormality. Electronically Signed   By: Rolm Baptise M.D.   On: 08/25/2022 03:52  LOS: 2 days       Emeterio Reeve, DO [facility] 08/28/2022, 1:35 PM    Dictation software may have  been used to generate the above note. Typos may occur and escape review in typed/dictated notes. Please contact Dr Sheppard Coil directly for clarity if needed.  Staff may message me via secure chat in [place]  but this may not receive an immediate response,  please page me for urgent matters!  If 7PM-7AM, please contact night coverage [email or web address]

## 2022-08-29 DIAGNOSIS — N390 Urinary tract infection, site not specified: Secondary | ICD-10-CM | POA: Diagnosis not present

## 2022-08-29 DIAGNOSIS — A4150 Gram-negative sepsis, unspecified: Secondary | ICD-10-CM | POA: Diagnosis not present

## 2022-08-29 MED ORDER — POLYETHYLENE GLYCOL 3350 17 G PO PACK
17.0000 g | PACK | Freq: Two times a day (BID) | ORAL | Status: AC
  Administered 2022-08-29 – 2022-08-30 (×3): 17 g via ORAL
  Filled 2022-08-29 (×3): qty 1

## 2022-08-29 MED ORDER — SENNOSIDES-DOCUSATE SODIUM 8.6-50 MG PO TABS
2.0000 | ORAL_TABLET | Freq: Two times a day (BID) | ORAL | Status: AC
  Administered 2022-08-29 – 2022-08-30 (×3): 2 via ORAL
  Filled 2022-08-29 (×3): qty 2

## 2022-08-29 NOTE — TOC Progression Note (Signed)
Transition of Care [facility]) - Progression Note    Patient Details  Name: Jordan [name] MRN: [id number] Date of Birth: 1923/05/10  Transition of Care [facility]) CM/SW [place], RN Phone Number: 08/29/2022, 12:14 PM  Clinical Narrative:   Spoke with the patient's daughter Jordan [name], I reviewed the bed offers with her She will look up the facilities and call me back with a choice     Expected Discharge Plan: [place] Barriers to Discharge: SNF Pending bed offer  Expected Discharge Plan and Services   Discharge Planning Services: CM Consult Post Acute Care Choice: [place] Living arrangements for the past 2 months: Single Family Home                 DME Arranged: [name] DME Agency: NA       HH Arranged: NA           Social Determinants of Health (SDOH) Interventions SDOH Screenings   Food Insecurity: No Food Insecurity (08/26/2022)  Housing: Low Risk  (08/26/2022)  Transportation Needs: No Transportation Needs (08/26/2022)  Utilities: Not At Risk (08/26/2022)  Tobacco Use: Medium Risk (08/26/2022)    Readmission Risk Interventions    07/18/2022    2:18 PM 07/14/2022   11:46 AM 07/11/2022   12:11 PM  Readmission Risk Prevention Plan  Transportation Screening  Complete Complete  PCP or Specialist Appt within 3-5 Days  Complete Complete  HRI or [place]  Complete Complete  Social Work Consult for [place] Planning/Counseling  Complete Complete  Palliative Care Screening  Not Applicable Not Applicable  Medication Review Press photographer)  Complete Complete  PCP or Specialist appointment within 3-5 days of discharge Complete    HRI or [place] Complete    SW Recovery Care/Counseling Consult Complete    [place] Not Applicable

## 2022-08-29 NOTE — TOC Progression Note (Signed)
Transition of Care [facility]) - Progression Note    Patient Details  Name: Jordan [name] MRN: [id number] Date of Birth: 1923-01-02  Transition of Care [facility]) CM/SW [place], RN Phone Number: 08/29/2022, 11:20 AM  Clinical Narrative:     Called daughter [name] to review the bed offrs, not able to reach, left a voice mail asking for a call back  Expected Discharge Plan: [place] Barriers to Discharge: SNF Pending bed offer  Expected Discharge Plan and Services   Discharge Planning Services: CM Consult Post Acute Care Choice: [place] Living arrangements for the past 2 months: Single Family Home                 DME Arranged: [name] DME Agency: NA       HH Arranged: NA           Social Determinants of Health (SDOH) Interventions SDOH Screenings   Food Insecurity: No Food Insecurity (08/26/2022)  Housing: Low Risk  (08/26/2022)  Transportation Needs: No Transportation Needs (08/26/2022)  Utilities: Not At Risk (08/26/2022)  Tobacco Use: Medium Risk (08/26/2022)    Readmission Risk Interventions    07/18/2022    2:18 PM 07/14/2022   11:46 AM 07/11/2022   12:11 PM  Readmission Risk Prevention Plan  Transportation Screening  Complete Complete  PCP or Specialist Appt within 3-5 Days  Complete Complete  HRI or [place]  Complete Complete  Social Work Consult for [place] Planning/Counseling  Complete Complete  Palliative Care Screening  Not Applicable Not Applicable  Medication Review Press photographer)  Complete Complete  PCP or Specialist appointment within 3-5 days of discharge Complete    HRI or [place] Complete    SW Recovery Care/Counseling Consult Complete    [place] Not Applicable

## 2022-08-29 NOTE — Plan of Care (Signed)
  Problem: Clinical Measurements: Goal: Ability to maintain clinical measurements within normal limits will improve Outcome: Progressing   Problem: Clinical Measurements: Goal: Will remain free from infection Outcome: Progressing   Problem: Clinical Measurements: Goal: Respiratory complications will improve Outcome: Progressing   Problem: Elimination: Goal: Will not experience complications related to bowel motility Outcome: Progressing   Problem: Elimination: Goal: Will not experience complications related to urinary retention Outcome: Progressing   Problem: Skin Integrity: Goal: Risk for impaired skin integrity will decrease Outcome: Progressing   Problem: Safety: Goal: Ability to remain free from injury will improve Outcome: Progressing

## 2022-08-29 NOTE — Progress Notes (Signed)
Physical Therapy Treatment Patient Details Name: Jordan [name] MRN: [id number] DOB: 06-15-1923 Today's Date: 08/29/2022   History of Present Illness Recently in hospital 08/15/22-08/22/22 for femur fracture after fall at his facility [facility]). He underwent right intramedullary nail intertrochanteric on 08/16/2022. DIscharged to SNF Rehab at [facility].      Returned to ED early yesterday AM approx 03:00 for "seizure like activity" per staff - states the patient was witnessed flickering his eye lids for about 30 seconds and he had not been sleeping since he arrived at that [place] care facility two days prior. There was no witnessed tonic-clonic movement, frothing at the mouth, eyes rolling in the top of his head, tongue biting, urinary incontinence or postictal state.  Patient only complaint was some soreness from a fall he had two ago. Was not sent for evaluation following that fall. No LOC. Daughter expressed frustration and displeasure at patient's current SNF due to fall and she did not like the care that he has been receiving there. ED placed consult for social work for alternate SNF placement. Pt remains agitated.    PT Comments    Pt seen this am as planned. Much more alert and engaged in PT session. Pt is able to follow simple commands and denies Right hip pain.  Initially attempted transferring to bedside chair with support of RW, however pt unsteady with difficulty shifting weight forward with increased fall risk. Pt progressed to chair via Stand Pivot Transfer with Mod/MaxA. Positioned to comfort in recliner. Will continue to progress mobility and strength acutely.   Recommendations for follow up therapy are one component of a multi-disciplinary discharge planning process, led by the attending physician.  Recommendations may be updated based on patient status, additional functional criteria and insurance authorization.  Follow Up Recommendations  Skilled  nursing-short term rehab (<3 hours/day) Can patient physically be transported by private vehicle: No   Assistance Recommended at Discharge Frequent or constant Supervision/Assistance  Patient can return home with the following Two people to help with walking and/or transfers;Two people to help with bathing/dressing/bathroom;Assistance with cooking/housework;Assistance with feeding;Direct supervision/assist for medications management;Direct supervision/assist for financial management;Assist for transportation;Help with stairs or ramp for entrance   Equipment Recommendations  None recommended by PT    Recommendations for Other Services       Precautions / Restrictions Precautions Precautions: Fall Precaution Comments: Per chart, pt is legally blind Restrictions Weight Bearing Restrictions: Yes RLE Weight Bearing: Weight bearing as tolerated     Mobility  Bed Mobility Overal bed mobility: Needs Assistance Bed Mobility: Supine to Sit     Supine to sit: Mod assist, HOB elevated     General bed mobility comments: Following commands well this am and able to scoot self to EOB.    Transfers Overall transfer level: Needs assistance Equipment used: Rolling walker (2 wheels) Transfers: Sit to/from Stand Sit to Stand: Mod assist, Max assist, From elevated surface Stand pivot transfers: Max assist         General transfer comment: Attempted transferring with RW to bedside chair, pt unsafe and therefore completed SPT    Ambulation/Gait                   Stairs             Wheelchair Mobility    Modified Rankin (Stroke Patients Only)       Balance Overall balance assessment: Needs assistance Sitting-balance support: Feet supported, Bilateral upper extremity supported Sitting balance-Leahy Scale:  Fair Sitting balance - Comments:  (Tolerated 3 minutes static sitting without LOB)   Standing balance support: Reliant on assistive device for balance, During  functional activity, Bilateral upper extremity supported Standing balance-Leahy Scale: Poor Standing balance comment: Pt struggled to maintain upright posture, +posterior retropulsion                            Cognition Arousal/Alertness: Awake/alert Behavior During Therapy: WFL for tasks assessed/performed Overall Cognitive Status: No family/caregiver present to determine baseline cognitive functioning                                 General Comments: unoriented to time, situation, and safety. Occasional hallucinations. However pleasant and cooperative        Exercises General Exercises - Lower Extremity Ankle Circles/Pumps: AAROM, Both, 15 reps Heel Slides: AAROM, Both, 10 reps Hip ABduction/ADduction: AAROM, Right, 10 reps Straight Leg Raises: AAROM, Right, 5 reps    General Comments General comments (skin integrity, edema, etc.): Much more alert today, very cooperative and motivated to get OOB      Pertinent Vitals/Pain Pain Assessment Pain Assessment: Faces Faces Pain Scale: Hurts a little bit    Home Living                          Prior Function            PT Goals (current goals can now be found in the care plan section) Acute Rehab PT Goals Patient Stated Goal: none stated Progress towards PT goals: Progressing toward goals    Frequency    7X/week      PT Plan Current plan remains appropriate    Co-evaluation              AM-PAC PT "6 Clicks" Mobility   Outcome Measure  Help needed turning from your back to your side while in a flat bed without using bedrails?: A Lot Help needed moving from lying on your back to sitting on the side of a flat bed without using bedrails?: A Lot Help needed moving to and from a bed to a chair (including a wheelchair)?: A Lot Help needed standing up from a chair using your arms (e.g., wheelchair or bedside chair)?: A Lot Help needed to walk in hospital room?: Total Help  needed climbing 3-5 steps with a railing? : Total 6 Click Score: 10    End of Session Equipment Utilized During Treatment: Gait belt Activity Tolerance: Patient tolerated treatment well Patient left: in chair;with call bell/phone within reach;with chair alarm set;with nursing/sitter in room Nurse Communication: Mobility status PT Visit Diagnosis: Unsteadiness on feet (R26.81);Other abnormalities of gait and mobility (R26.89);Muscle weakness (generalized) (M62.81)     Time: 7616-0737 PT Time Calculation (min) (ACUTE ONLY): 39 min  Charges:  $Therapeutic Exercise: 8-22 mins $Therapeutic Activity: 23-37 mins                    [name], PTA   [name] 08/29/2022, 12:39 PM

## 2022-08-29 NOTE — Progress Notes (Signed)
PROGRESS NOTE    Jordan [name]   ZOX:[id number] DOB: 31-Mar-1923  DOA: 08/25/2022 Date of Service: 08/29/22 PCP: Asencion Noble, MD     Brief Narrative / Hospital Course:  Recently in hospital 08/15/22-08/22/22 for femur fracture after fall at his facility [facility]). He underwent right intramedullary nail intertrochanteric on 08/16/2022. DIscharged to SNF Rehab at [facility].   Returned to ED early AM approx 03:00 for "seizure like activity" per staff - states the patient was witnessed flickering his eye lids for about 30 seconds and he had not been sleeping since he arrived at that [place] care facility two days prior. There was no witnessed tonic-clonic movement, frothing at the mouth, eyes rolling in the top of his head, tongue biting, urinary incontinence or postictal state.  Patient only complaint was some soreness from a fall he had two ago. Was not sent for evaluation following that fall. No LOC. Daughter expressed frustration and displeasure at patient's current SNF due to fall and she did not like the care that he has been receiving there. ED placed consult for social work for alternate SNF placement.   As of early AM 08/26/22, patient had been boarding for the past 26+ hours in the ED pending TOC placement.  Staff alerted ED MD that patient has been sleeping all day and night, not gotten up and not eaten or drank anything, when they changed his brief, there was gross blood anteriorly over the diaper, around his urethral meatus.  ED MD ordered repeat labs and urinalysis which were distinctly different than the initial diagnostics done about 24 hours prior. Started ceftriaxone, consulted hospitalist for admission. HR and RR increased and UA concerning for UTI = sepsis.  02/10: Admitted to hospitalist service. Continuing abx pending cultures. 02/1: Per RN very agitated and combative overnight, requiring sitter. UCx 02/11 (+)GNR 02/12: remains confused, needs to be  without sitter or mittens 48h for SNF to accept back. Increased seroquel, trial d/c sitter today. UCx(+) Enterobacter aerogenes (S)ceftriaxone, continue IV abx while here  02/13: more alert today. Has not needed sitter or IV/IM meds since yesterday at noon. Placement still pending.      Consultants:  none  Procedures: none      ASSESSMENT & PLAN:   Principal Problem:   Sepsis due to gram-negative UTI [facility]) Active Problems:   Femur fracture (HCC)   Dementia with behavioral disturbance (HCC)   Chronic diastolic CHF (congestive heart failure) (HCC)   Type 2 diabetes mellitus with complication (HCC)   Hypertension   Diverticulosis of colon without hemorrhage   Hyperlipidemia   Benign prostatic hyperplasia with urinary obstruction   Malnutrition of moderate degree  Sepsis due to Enterobacter aerogenes ([place]) - sepsis parameters have resolved Ceftriaxone while here - today 08/29/22 is Day 3  Monitor VS, fever curve, CBC, BMP  Femur fracture (HCC) s/p repair Pain control PT/OT  Chronic diastolic CHF (congestive heart failure) ([place]) Hypertension Hyperlipidemia CHF not in exacerbation Caution w/ IV fluids  Continue home ASA, statin, beta blocker  Type 2 diabetes mellitus with complication (HCC) Only on metformin at home A1C 7.2 2 mos ago - decent control, at goal for age  Hold metformin for now  Monitor Glc ac/hs x24 h Initiate SSI if needed  Benign prostatic hyperplasia with urinary obstruction Continue Myrbetriq, finasteride  Malnutrition of moderate degree Nutrition supplementation w/ Ensure   Dementia with behavioral disturbance (HCC) Agitation increased likely d/t sundowning and UTI Increased Seroquel has helped  Continue Namenda  Redirect as able/as needed    DVT prophylaxis: lovenox  Pertinent IV fluids/nutrition: no continuous IV fluids  Central lines / invasive devices: none  Code Status: DNR - advanced directive DNR form on file   Current  Admission Status: inpatient   TOC needs / Dispo plan: SNF - last sitter/mitts approx 12:00/noon on 08/28/22  Barriers to discharge / significant pending items: placement              Subjective / Brief ROS:  Patient reports no concerns  He is more alert today, knows he is in the hospital but uncertain why - seems understanding when told he had a urine infection and this can cause confusion    Family Communication:extensive phone discussion yesterday w/ daughter     Objective Findings:  Vitals:   08/28/22 1530 08/29/22 0027 08/29/22 0755 08/29/22 0830  BP:  (!) 164/71 (!) 168/70 (!) 153/62  Pulse: 85 80 73 71  Resp: 18 15 17 17   Temp: 99.2 F (37.3 C) 98.8 F (37.1 C) 99.3 F (37.4 C) 97.9 F (36.6 C)  TempSrc: Oral Oral Oral   SpO2: 100% 96% 95% 100%  Height:        Intake/Output Summary (Last 24 hours) at 08/29/2022 1233 Last data filed at 08/29/2022 1118 Gross per 24 hour  Intake 496 ml  Output 1550 ml  Net -1054 ml   There were no vitals filed for this visit.  Examination:  Physical Exam Constitutional:      General: He is not in acute distress. Cardiovascular:     Rate and Rhythm: Normal rate. Rhythm irregular.     Heart sounds: Murmur heard.  Pulmonary:     Effort: Pulmonary effort is normal.     Breath sounds: Normal breath sounds.  Abdominal:     Palpations: Abdomen is soft.  Skin:    General: Skin is warm and dry.  Neurological:     General: No focal deficit present.     Mental Status: He is disoriented.  Psychiatric:        Behavior: Behavior normal.          Scheduled Medications:   aspirin  81 mg Oral BID   budesonide  3 mg Oral Daily   docusate  100 mg Oral Daily   enoxaparin (LOVENOX) injection  40 mg Subcutaneous Q24H   feeding supplement  237 mL Oral TID PC   memantine  10 mg Oral BID   mirabegron ER  25 mg Oral Daily   multivitamin  1 tablet Oral Daily   polyethylene glycol  17 g Oral BID   pravastatin  80 mg Oral  q1800   QUEtiapine  100 mg Oral QHS   QUEtiapine  50 mg Oral BID WC   senna-docusate  2 tablet Oral BID    Continuous Infusions:  cefTRIAXone (ROCEPHIN)  IV 1 g (08/29/22 0903)    PRN Medications:  acetaminophen **OR** acetaminophen, bisacodyl, melatonin, oxyCODONE  Antimicrobials from admission:  Anti-infectives (From admission, onward)    Start     Dose/Rate Route Frequency Ordered Stop   08/27/22 0800  cefTRIAXone (ROCEPHIN) 1 g in sodium chloride 0.9 % 100 mL IVPB        1 g 200 mL/hr over 30 Minutes Intravenous Every 24 hours 08/26/22 1114     08/26/22 0530  cefTRIAXone (ROCEPHIN) 1 g in sodium chloride 0.9 % 100 mL IVPB        1 g 200 mL/hr over 30 Minutes Intravenous  Once 08/26/22 0518 08/26/22 0620           Data Reviewed:  I have personally reviewed the following...  CBC: Recent Labs  Lab 08/25/22 0335 08/26/22 0427 08/26/22 1154 08/27/22 0421 08/28/22 0515  WBC 7.6 8.9 16.0* 7.0 6.4  NEUTROABS 5.9 7.8*  --   --   --   HGB 10.0* 8.7* 8.1* 7.4* 8.5*  HCT 32.4* 28.0* 25.5* 23.4* 26.7*  MCV 91.0 90.9 91.4 90.3 89.9  PLT 456* 394 359 336 254   Basic Metabolic Panel: Recent Labs  Lab 08/25/22 0335 08/26/22 0427 08/26/22 1154 08/27/22 0421  NA 139 137  --  138  K 3.6 4.0  --  3.7  CL 104 101  --  106  CO2 25 29  --  25  GLUCOSE 119* 130*  --  117*  BUN 45* 37*  --  35*  CREATININE 1.07 1.03 1.00 0.90  CALCIUM 8.4* 8.2*  --  7.9*  MG 2.1  --   --   --    GFR: Estimated Creatinine Clearance: 35.6 mL/min (by C-G formula based on SCr of 0.9 mg/dL). Liver Function Tests: Recent Labs  Lab 08/25/22 0335  AST 34  ALT 14  ALKPHOS 82  BILITOT 1.0  PROT 6.4*  ALBUMIN 2.9*   No results for input(s): "LIPASE", "AMYLASE" in the last 168 hours. No results for input(s): "AMMONIA" in the last 168 hours. Coagulation Profile: No results for input(s): "INR", "PROTIME" in the last 168 hours. Cardiac Enzymes: No results for input(s): "CKTOTAL",  "CKMB", "CKMBINDEX", "TROPONINI" in the last 168 hours. BNP (last 3 results) No results for input(s): "PROBNP" in the last 8760 hours. HbA1C: No results for input(s): "HGBA1C" in the last 72 hours. CBG: Recent Labs  Lab 08/25/22 1651 08/26/22 1749 08/26/22 2108 08/27/22 2141  GLUCAP 111* 148* 140* 141*   Lipid Profile: No results for input(s): "CHOL", "HDL", "LDLCALC", "TRIG", "CHOLHDL", "LDLDIRECT" in the last 72 hours. Thyroid Function Tests: No results for input(s): "TSH", "T4TOTAL", "FREET4", "T3FREE", "THYROIDAB" in the last 72 hours.  Anemia Panel: No results for input(s): "VITAMINB12", "FOLATE", "FERRITIN", "TIBC", "IRON", "RETICCTPCT" in the last 72 hours. Most Recent Urinalysis On File:     Component Value Date/Time   COLORURINE BROWN (A) 08/26/2022 0422   APPEARANCEUR CLOUDY (A) 08/26/2022 0422   APPEARANCEUR Clear 03/17/2022 1240   LABSPEC 1.024 08/26/2022 0422   PHURINE  08/26/2022 0422    TEST NOT REPORTED DUE TO COLOR INTERFERENCE OF URINE PIGMENT   GLUCOSEU (A) 08/26/2022 0422    TEST NOT REPORTED DUE TO COLOR INTERFERENCE OF URINE PIGMENT   HGBUR (A) 08/26/2022 0422    TEST NOT REPORTED DUE TO COLOR INTERFERENCE OF URINE PIGMENT   BILIRUBINUR (A) 08/26/2022 0422    TEST NOT REPORTED DUE TO COLOR INTERFERENCE OF URINE PIGMENT   BILIRUBINUR negative 04/13/2022 1525   BILIRUBINUR Negative 03/17/2022 1240   KETONESUR (A) 08/26/2022 0422    TEST NOT REPORTED DUE TO COLOR INTERFERENCE OF URINE PIGMENT   PROTEINUR (A) 08/26/2022 0422    TEST NOT REPORTED DUE TO COLOR INTERFERENCE OF URINE PIGMENT   UROBILINOGEN 1.0 04/13/2022 1525   NITRITE (A) 08/26/2022 0422    TEST NOT REPORTED DUE TO COLOR INTERFERENCE OF URINE PIGMENT   LEUKOCYTESUR (A) 08/26/2022 0422    TEST NOT REPORTED DUE TO COLOR INTERFERENCE OF URINE PIGMENT   Sepsis Labs: @LABRCNTIP (procalcitonin:4,lacticidven:4) Microbiology: Recent Results (from the past 240 hour(s))  Urine Culture (for  pregnant,  neutropenic or urologic patients or patients with an indwelling urinary catheter)     Status: Abnormal   Collection Time: 08/26/22  4:21 AM   Specimen: Urine, Clean Catch  Result Value Ref Range Status   Specimen Description   Final    URINE, CLEAN CATCH Performed at [facility], [address] [place], [place] [address]    Special Requests   Final    NONE Performed at [facility], [address] [place], [place] [address]    Culture >=100,000 COLONIES/mL ENTEROBACTER AEROGENES (A)  Final   Report Status 08/28/2022 FINAL  Final   Organism ID, Bacteria ENTEROBACTER AEROGENES (A)  Final      Susceptibility   Enterobacter aerogenes - MIC*    CEFAZOLIN >=64 RESISTANT Resistant     CEFEPIME <=0.12 SENSITIVE Sensitive     CEFTRIAXONE <=0.25 SENSITIVE Sensitive     CIPROFLOXACIN <=0.25 SENSITIVE Sensitive     GENTAMICIN <=1 SENSITIVE Sensitive     IMIPENEM 1 SENSITIVE Sensitive     NITROFURANTOIN 128 RESISTANT Resistant     TRIMETH/SULFA <=20 SENSITIVE Sensitive     PIP/TAZO <=4 SENSITIVE Sensitive     * >=100,000 COLONIES/mL ENTEROBACTER AEROGENES      Radiology Studies last 3 days: No results found.           LOS: 3 days       Emeterio Reeve, DO [facility] 08/29/2022, 12:33 PM    Dictation software may have been used to generate the above note. Typos may occur and escape review in typed/dictated notes. Please contact Dr Sheppard Coil directly for clarity if needed.  Staff may message me via secure chat in [place]  but this may not receive an immediate response,  please page me for urgent matters!  If 7PM-7AM, please contact night coverage [email or web address]

## 2022-08-29 NOTE — TOC Progression Note (Signed)
Transition of Care [facility]) - Progression Note    Patient Details  Name: Jordan [name] MRN: [id number] Date of Birth: November 03, 1922  Transition of Care [facility]) CM/SW [place], RN Phone Number: 08/29/2022, 2:57 PM  Clinical Narrative:    Patients daughter [name] called and stated that she is going to [place] and will let me know her thoughts   Expected Discharge Plan: [place] Barriers to Discharge: SNF Pending bed offer  Expected Discharge Plan and Services   Discharge Planning Services: CM Consult Post Acute Care Choice: [place] Living arrangements for the past 2 months: Single Family Home                 DME Arranged: [name] DME Agency: NA       HH Arranged: NA           Social Determinants of Health (SDOH) Interventions SDOH Screenings   Food Insecurity: No Food Insecurity (08/26/2022)  Housing: Low Risk  (08/26/2022)  Transportation Needs: No Transportation Needs (08/26/2022)  Utilities: Not At Risk (08/26/2022)  Tobacco Use: Medium Risk (08/26/2022)    Readmission Risk Interventions    07/18/2022    2:18 PM 07/14/2022   11:46 AM 07/11/2022   12:11 PM  Readmission Risk Prevention Plan  Transportation Screening  Complete Complete  PCP or Specialist Appt within 3-5 Days  Complete Complete  HRI or [place]  Complete Complete  Social Work Consult for [place] Planning/Counseling  Complete Complete  Palliative Care Screening  Not Applicable Not Applicable  Medication Review Press photographer)  Complete Complete  PCP or Specialist appointment within 3-5 days of discharge Complete    HRI or [place] Complete    SW Recovery Care/Counseling Consult Complete    [place] Not Applicable

## 2022-08-30 DIAGNOSIS — A4150 Gram-negative sepsis, unspecified: Secondary | ICD-10-CM | POA: Diagnosis not present

## 2022-08-30 DIAGNOSIS — N390 Urinary tract infection, site not specified: Secondary | ICD-10-CM | POA: Diagnosis not present

## 2022-08-30 NOTE — TOC Progression Note (Signed)
Transition of Care [facility]) - Progression Note    Patient Details  Name: Jordan [name] MRN: [id number] Date of Birth: 1922-12-16  Transition of Care [facility]) CM/SW [place], RN Phone Number: 08/30/2022, 10:14 AM  Clinical Narrative:   Spoke with the patient's daughter [name], She toured the facility [facility], They called and let me know they will not have a bed until Thursday or maybe Friday    Expected Discharge Plan: [place] Barriers to Discharge: SNF Pending bed offer  Expected Discharge Plan and Services   Discharge Planning Services: CM Consult Post Acute Care Choice: [place] Living arrangements for the past 2 months: Single Family Home                 DME Arranged: [name] DME Agency: NA       HH Arranged: NA           Social Determinants of Health (SDOH) Interventions SDOH Screenings   Food Insecurity: No Food Insecurity (08/26/2022)  Housing: Low Risk  (08/26/2022)  Transportation Needs: No Transportation Needs (08/26/2022)  Utilities: Not At Risk (08/26/2022)  Tobacco Use: Medium Risk (08/26/2022)    Readmission Risk Interventions    07/18/2022    2:18 PM 07/14/2022   11:46 AM 07/11/2022   12:11 PM  Readmission Risk Prevention Plan  Transportation Screening  Complete Complete  PCP or Specialist Appt within 3-5 Days  Complete Complete  HRI or [place]  Complete Complete  Social Work Consult for [place] Planning/Counseling  Complete Complete  Palliative Care Screening  Not Applicable Not Applicable  Medication Review Press photographer)  Complete Complete  PCP or Specialist appointment within 3-5 days of discharge Complete    HRI or [place] Complete    SW Recovery Care/Counseling Consult Complete    [place] Not Applicable

## 2022-08-30 NOTE — Progress Notes (Signed)
PT session limited due to increased confusion this am. Unable to redirect pt from thinking he was not in a moving vehicle, and therefore could not transfer to edge of bed. Attempted ROM to B LE's in supine, pt resistive due to not understanding reason for task. Pt was however very pleasant throughout attempted session. Will try to progress as able.     08/30/22 1125  PT Visit Information  Assistance Needed +2  History of Present Illness Recently in hospital 08/15/22-08/22/22 for femur fracture after fall at his facility [facility]). He underwent right intramedullary nail intertrochanteric on 08/16/2022. DIscharged to SNF Rehab at [facility].      Returned to ED early yesterday AM approx 03:00 for "seizure like activity" per staff - states the patient was witnessed flickering his eye lids for about 30 seconds and he had not been sleeping since he arrived at that [place] care facility two days prior. There was no witnessed tonic-clonic movement, frothing at the mouth, eyes rolling in the top of his head, tongue biting, urinary incontinence or postictal state.  Patient only complaint was some soreness from a fall he had two ago. Was not sent for evaluation following that fall. No LOC. Daughter expressed frustration and displeasure at patient's current SNF due to fall and she did not like the care that he has been receiving there. ED placed consult for social work for alternate SNF placement. Pt remains agitated.  Subjective Data  Subjective I need to get gas  Patient Stated Goal none stated  Precautions  Precautions Fall  Precaution Comments Per chart, pt is legally blind  Restrictions  Weight Bearing Restrictions Yes  RLE Weight Bearing WBAT  Cognition  Arousal/Alertness Awake/alert  Behavior During Therapy WFL for tasks assessed/performed  Overall Cognitive Status No family/caregiver present to determine baseline cognitive functioning  General Comments unoriented to time,  situation, and safety. Occasional hallucinations. However pleasant and cooperative  Bed Mobility  Overal bed mobility Needs Assistance  Bed Mobility Supine to Sit  Supine to sit Mod assist;HOB elevated  Sit to supine Max assist;+2 for safety/equipment  General bed mobility comments  (very confused this date)  General Comments  General comments (skin integrity, edema, etc.)  (Several attempts to redirect do to confusion. Unable to safely transfer out of bed, therefore repositioned to facilitate skin integrity and comfort)  Exercises  Exercises General Lower Extremity  General Exercises - Lower Extremity  Ankle Circles/Pumps AAROM;Both;5 reps  Heel Slides AAROM;Both;5 reps  Hip ABduction/ADduction AAROM;Both;5 reps  PT - End of Session  Activity Tolerance  (Pt limited by confusion)  Patient left in bed;with call bell/phone within reach;with bed alarm set  Nurse Communication Mobility status (Confusion)   PT - Assessment/Plan  PT Plan Current plan remains appropriate  PT Visit Diagnosis Unsteadiness on feet (R26.81);Other abnormalities of gait and mobility (R26.89);Muscle weakness (generalized) (M62.81)  PT Frequency (ACUTE ONLY) 7X/week  Recommendations for Other Services OT consult  Follow Up Recommendations Skilled nursing-short term rehab (<3 hours/day)  Can patient physically be transported by private vehicle No  Assistance recommended at discharge Frequent or constant Supervision/Assistance  Patient can return home with the following Two people to help with walking and/or transfers;Two people to help with bathing/dressing/bathroom;Assistance with cooking/housework;Assistance with feeding;Direct supervision/assist for medications management;Direct supervision/assist for financial management;Assist for transportation;Help with stairs or ramp for entrance  PT equipment None recommended by PT  AM-PAC PT "6 Clicks" Mobility Outcome Measure (Version 2)  Help needed turning from your  back  to your side while in a flat bed without using bedrails? 2  Help needed moving from lying on your back to sitting on the side of a flat bed without using bedrails? 2  Help needed moving to and from a bed to a chair (including a wheelchair)? 2  Help needed standing up from a chair using your arms (e.g., wheelchair or bedside chair)? 2  Help needed to walk in hospital room? 1  Help needed climbing 3-5 steps with a railing?  1  6 Click Score 10  Consider Recommendation of Discharge To: CIR/SNF/LTACH  Progressive Mobility  What is the highest level of mobility based on the progressive mobility assessment? Level 1 (Bedfast) - Unable to balance while sitting on edge of bed  Activity  (Repositioned in bed)  PT Time Calculation  PT Start Time (ACUTE ONLY) 1010  PT Stop Time (ACUTE ONLY) 1020  PT Time Calculation (min) (ACUTE ONLY) 10 min  PT General Charges  $$ ACUTE PT VISIT 1 Visit  PT Treatments  $Therapeutic Exercise 8-22 mins   [name], PTA

## 2022-08-30 NOTE — Plan of Care (Signed)
  Problem: Safety: Goal: Ability to remain free from injury will improve Outcome: Progressing   Problem: Skin Integrity: Goal: Risk for impaired skin integrity will decrease Outcome: Progressing   

## 2022-08-30 NOTE — Progress Notes (Signed)
Progress Note   Patient: Jordan [name] ATF:[id number] DOB: May 17, 1923 DOA: 08/25/2022     4 DOS: the patient was seen and examined on 08/30/2022   Brief hospital course: Recently in hospital 08/15/22-08/22/22 for femur fracture after fall at his facility [facility]). He underwent right intramedullary nail intertrochanteric on 08/16/2022. DIscharged to SNF Rehab at [facility].    Returned to ED early AM approx 03:00 for "seizure like activity" per staff - states the patient was witnessed flickering his eye lids for about 30 seconds and he had not been sleeping since he arrived at that [place] care facility two days prior. There was no witnessed tonic-clonic movement, frothing at the mouth, eyes rolling in the top of his head, tongue biting, urinary incontinence or postictal state.  Patient only complaint was some soreness from a fall he had two ago. Was not sent for evaluation following that fall. No LOC. Daughter expressed frustration and displeasure at patient's current SNF due to fall and she did not like the care that he has been receiving there. ED placed consult for social work for alternate SNF placement.    As of early AM 08/26/22, patient had been boarding for the past 26+ hours in the ED pending TOC placement.  Staff alerted ED MD that patient had been sleeping all day and night, not gotten up and not eaten or drank anything, when they changed his brief, there was gross blood anteriorly over the diaper, around his urethral meatus.  ED MD ordered repeat labs and urinalysis which were distinctly different than the initial diagnostics done about 24 hours prior. Started ceftriaxone, consulted hospitalist for admission. HR and RR increased and UA concerning for UTI = sepsis.  02/10: Admitted to hospitalist service. Continuing abx pending cultures. 02/1: Per RN very agitated and combative overnight, requiring sitter. UCx 02/11 (+)GNR 02/12: remains confused, needs to be without  sitter or mittens 48h for SNF to accept back. Increased seroquel, trial d/c sitter today. UCx(+) Enterobacter aerogenes (S)ceftriaxone, continue IV abx while here  02/13: more alert today. Has not needed sitter or IV/IM meds since yesterday at noon. Placement still pending.  02/14: Pleasant during my evaluation.  Assisted with meals by an nurse tech.  Has not required a sitter.    Assessment and Plan:  Principal Problem:   Sepsis due to gram-negative UTI [facility]) Active Problems:   Femur fracture (HCC)   Dementia with behavioral disturbance (HCC)   Chronic diastolic CHF (congestive heart failure) (HCC)   Type 2 diabetes mellitus with complication (HCC)   Hypertension   Diverticulosis of colon without hemorrhage   Hyperlipidemia   Benign prostatic hyperplasia with urinary obstruction   Malnutrition of moderate degree   Sepsis due to Enterobacter aerogenes ([place]) - sepsis parameters have resolved Ceftriaxone while here - today 08/29/22 is Day 4  Patient remains on   Femur fracture [facility]) s/p repair Pain control PT/OT   Chronic diastolic CHF (congestive heart failure) ([place]) Hypertension Hyperlipidemia CHF not in exacerbation Continue home ASA, statin, beta blocker   Type 2 diabetes mellitus with complication (HCC) Only on metformin at home A1C 7.2 2 mos ago - decent control, at goal for age  Hold metformin for now  Monitor Glc ac/hs x24 h Initiate SSI if needed   Benign prostatic hyperplasia with urinary obstruction Continue Myrbetriq, finasteride   Malnutrition of moderate degree Nutrition supplementation w/ Ensure    Dementia with behavioral disturbance (HCC) Agitation increased likely d/t sundowning and UTI Increased Seroquel  has helped  Continue Namenda Redirect as able/as needed  Has not required a sitter               Physical Exam: Vitals:   08/29/22 0830 08/29/22 1531 08/30/22 0007 08/30/22 0933  BP: (!) 153/62 138/68 (!) 157/70 133/69  Pulse: 71  98 98 85  Resp: 17 17 18 16   Temp: 97.9 F (36.6 C) 98 F (36.7 C) 98.1 F (36.7 C) 98.8 F (37.1 C)  TempSrc:      SpO2: 100% 98% 96% 96%  Height:       Physical Exam Vitals and nursing note reviewed.  Constitutional:      Comments: Lying in bed.  Appears comfortable and in no distress  HENT:     Head: Normocephalic and atraumatic.     Ears:     Comments: Hard of hearing    Nose: Nose normal.     Mouth/Throat:     Mouth: Mucous membranes are moist.  Eyes:     Conjunctiva/sclera: Conjunctivae normal.  Cardiovascular:     Rate and Rhythm: Normal rate and regular rhythm.  Pulmonary:     Effort: Pulmonary effort is normal.     Breath sounds: Normal breath sounds.  Abdominal:     General: Abdomen is flat.     Palpations: Abdomen is soft.  Musculoskeletal:     Cervical back: Normal range of motion and neck supple.     Comments: Decreased range of motion right hip  Skin:    General: Skin is warm and dry.  Neurological:     General: No focal deficit present.     Comments: Oriented to person  Psychiatric:        Mood and Affect: Mood normal.        Behavior: Behavior normal.     Data Reviewed:  There are no new results to review at this time.  Family Communication: None  Disposition: Status is: Inpatient Remains inpatient appropriate because: Awaiting discharge to a skilled nursing facility  Planned Discharge Destination: Skilled nursing facility    Time spent: 30 minutes  Author: Collier Bullock, MD 08/30/2022 3:14 PM  For on call review [email or web address].

## 2022-08-31 LAB — BASIC METABOLIC PANEL
Anion gap: 7 (ref 5–15)
BUN: 25 mg/dL — ABNORMAL HIGH (ref 8–23)
CO2: 27 mmol/L (ref 22–32)
Calcium: 7.9 mg/dL — ABNORMAL LOW (ref 8.9–10.3)
Chloride: 104 mmol/L (ref 98–111)
Creatinine, Ser: 0.92 mg/dL (ref 0.61–1.24)
GFR, Estimated: >60 mL/min (ref >60)
Glucose, Bld: 108 mg/dL — ABNORMAL HIGH (ref 70–99)
Potassium: 3.6 mmol/L (ref 3.5–5.1)
Sodium: 138 mmol/L (ref 135–145)

## 2022-08-31 LAB — CBC
HCT: 25.5 % — ABNORMAL LOW (ref 39.0–52.0)
Hemoglobin: 8.1 g/dL — ABNORMAL LOW (ref 13.0–17.0)
MCH: 28.4 pg (ref 26.0–34.0)
MCHC: 31.8 g/dL (ref 30.0–36.0)
MCV: 89.5 fL (ref 80.0–100.0)
Platelets: 445 10*3/uL — ABNORMAL HIGH (ref 150–400)
RBC: 2.85 MIL/uL — ABNORMAL LOW (ref 4.22–5.81)
RDW: 13.5 % (ref 11.5–15.5)
WBC: 5.1 10*3/uL (ref 4.0–10.5)
nRBC: 0 % (ref 0.0–0.2)

## 2022-08-31 NOTE — TOC Progression Note (Signed)
Transition of Care [facility]) - Progression Note    Patient Details  Name: Tajah Schreiner Reh MRN: [id number] Date of Birth: Aug 15, 1922  Transition of Care [facility]) CM/SW [place], RN Phone Number: 08/31/2022, 9:51 AM  Clinical Narrative:     The patient will need to be able to work with PT and ambulate to get insurance process started for approval to go to [facility]  Expected Discharge Plan: [place] Barriers to Discharge: SNF Pending bed offer  Expected Discharge Plan and Services   Discharge Planning Services: CM Consult Post Acute Care Choice: [place] Living arrangements for the past 2 months: Single Family Home                 DME Arranged: [name] DME Agency: NA       HH Arranged: NA           Social Determinants of Health (SDOH) Interventions SDOH Screenings   Food Insecurity: No Food Insecurity (08/26/2022)  Housing: Low Risk  (08/26/2022)  Transportation Needs: No Transportation Needs (08/26/2022)  Utilities: Not At Risk (08/26/2022)  Tobacco Use: Medium Risk (08/26/2022)    Readmission Risk Interventions    07/18/2022    2:18 PM 07/14/2022   11:46 AM 07/11/2022   12:11 PM  Readmission Risk Prevention Plan  Transportation Screening  Complete Complete  PCP or Specialist Appt within 3-5 Days  Complete Complete  HRI or [place]  Complete Complete  Social Work Consult for [place] Planning/Counseling  Complete Complete  Palliative Care Screening  Not Applicable Not Applicable  Medication Review [employer])  Complete Complete  PCP or Specialist appointment within 3-5 days of discharge Complete    HRI or [place] Complete    SW Recovery Care/Counseling Consult Complete    [place] Not Applicable

## 2022-08-31 NOTE — TOC Progression Note (Signed)
Transition of Care [facility]) - Progression Note    Patient Details  Name: Jordan [name] MRN: [id number] Date of Birth: 01/18/1923  Transition of Care [facility]) CM/SW [place], RN Phone Number: 08/31/2022, 2:54 PM  Clinical Narrative:    Spoke with the patient's daughter [name], I explained th patient's current state and she is agreeable to have [place] assess to see if he would be appropriate for Hospice facility, I reached out to Mist at [facility] and she will assess the patient    Expected Discharge Plan: [place] Barriers to Discharge: SNF Pending bed offer  Expected Discharge Plan and Services   Discharge Planning Services: CM Consult Post Acute Care Choice: [place] Living arrangements for the past 2 months: Single Family Home                 DME Arranged: [name] DME Agency: NA       HH Arranged: NA           Social Determinants of Health (SDOH) Interventions SDOH Screenings   Food Insecurity: No Food Insecurity (08/26/2022)  Housing: Low Risk  (08/26/2022)  Transportation Needs: No Transportation Needs (08/26/2022)  Utilities: Not At Risk (08/26/2022)  Tobacco Use: Medium Risk (08/26/2022)    Readmission Risk Interventions    07/18/2022    2:18 PM 07/14/2022   11:46 AM 07/11/2022   12:11 PM  Readmission Risk Prevention Plan  Transportation Screening  Complete Complete  PCP or Specialist Appt within 3-5 Days  Complete Complete  HRI or [place]  Complete Complete  Social Work Consult for [place] Planning/Counseling  Complete Complete  Palliative Care Screening  Not Applicable Not Applicable  Medication Review Press photographer)  Complete Complete  PCP or Specialist appointment within 3-5 days of discharge Complete    HRI or [place] Complete    SW Recovery Care/Counseling Consult Complete    [place] Not Applicable

## 2022-08-31 NOTE — Progress Notes (Signed)
. Progress Note   Patient: Jordan [name] MHD:[id number] DOB: 15-Apr-1923 DOA: 08/25/2022     5 DOS: the patient was seen and examined on 08/31/2022   Brief hospital course: Recently in hospital 08/15/22-08/22/22 for femur fracture after fall at his facility [facility] He underwent right intramedullary nail intertrochanteric on 08/16/2022. DIscharged to SNF Rehab at [facility].    Returned to ED early AM approx 03:00 for "seizure like activity" per staff - states the patient was witnessed flickering his eye lids for about 30 seconds and he had not been sleeping since he arrived at that [place] care facility two days prior. There was no witnessed tonic-clonic movement, frothing at the mouth, eyes rolling in the top of his head, tongue biting, urinary incontinence or postictal state.  Patient only complaint was some soreness from a fall he had two ago. Was not sent for evaluation following that fall. No LOC. Daughter expressed frustration and displeasure at patient's current SNF due to fall and she did not like the care that he has been receiving there. ED placed consult for social work for alternate SNF placement.    As of early AM 08/26/22, patient had been boarding for the past 26+ hours in the ED pending TOC placement.  Staff alerted ED MD that patient had been sleeping all day and night, not gotten up and not eaten or drank anything, when they changed his brief, there was gross blood anteriorly over the diaper, around his urethral meatus.  ED MD ordered repeat labs and urinalysis which were distinctly different than the initial diagnostics done about 24 hours prior. Started ceftriaxone, consulted hospitalist for admission. HR and RR increased and UA concerning for UTI = sepsis.  02/10: Admitted to hospitalist service. Continuing abx pending cultures. 02/1: Per RN very agitated and combative overnight, requiring sitter. UCx 02/11 (+)GNR 02/12: remains confused, needs to be without  sitter or mittens 48h for SNF to accept back. Increased seroquel, trial d/c sitter today. UCx(+) Enterobacter aerogenes (S)ceftriaxone, continue IV abx while here  02/13: more alert today. Has not needed sitter or IV/IM meds since yesterday at noon. Placement still pending.  02/14: Pleasant during my evaluation.  Assisted with meals by an nurse tech.  Has not required a sitter. 2/15 patient seen in the morning sleeping at the time of my evaluation.  Minimal participation in PT.  Recommendation SNF with short-term rehab.  Pending placement.  Assessment and Plan:  Principal Problem:   Sepsis due to gram-negative UTI [facility]) Active Problems:   Femur fracture (HCC)   Dementia with behavioral disturbance (HCC)   Chronic diastolic CHF (congestive heart failure) (HCC)   Type 2 diabetes mellitus with complication (HCC)   Hypertension   Diverticulosis of colon without hemorrhage   Hyperlipidemia   Benign prostatic hyperplasia with urinary obstruction   Malnutrition of moderate degree   Sepsis due to Enterobacter aerogenes ([place]) - sepsis parameters have resolved Ceftriaxone while here - today 08/29/22 is Day 4  Patient remains on   Femur fracture [facility]) s/p repair Pain control PT/OT   Chronic diastolic CHF (congestive heart failure) ([place]) Hypertension Hyperlipidemia CHF not in exacerbation Continue home ASA, statin, beta blocker   Type 2 diabetes mellitus with complication (HCC) Only on metformin at home A1C 7.2 2 mos ago - decent control, at goal for age  Hold metformin for now  Monitor Glc ac/hs x24 h Initiate SSI if needed   Benign prostatic hyperplasia with urinary obstruction Continue Myrbetriq, finasteride  Malnutrition of moderate degree Nutrition supplementation w/ Ensure    Dementia with behavioral disturbance (HCC) Agitation increased likely d/t sundowning and UTI Increased Seroquel has helped  Continue Namenda Redirect as able/as needed  Has not required a sitter         Physical Exam: Vitals:   08/30/22 0933 08/30/22 1533 08/31/22 0000 08/31/22 0750  BP: 133/69 (!) 146/77 (!) 153/56 (!) 139/58  Pulse: 85 100  79  Resp: 16 18 18 17   Temp: 98.8 F (37.1 C) 98 F (36.7 C) 98.3 F (36.8 C) 98.5 F (36.9 C)  TempSrc:    Oral  SpO2: 96% 100%  100%  Height:       Physical Exam Vitals and nursing note reviewed.  Constitutional:      Comments: Lying in bed.  Appears comfortable and in no distress  HENT:     Head: Normocephalic and atraumatic.     Ears:     Comments: Hard of hearing    Nose: Nose normal.     Mouth/Throat:     Mouth: Mucous membranes are moist.  Eyes:     Conjunctiva/sclera: Conjunctivae normal.  Cardiovascular:     Rate and Rhythm: Normal rate and regular rhythm.  Pulmonary:     Effort: Pulmonary effort is normal.     Breath sounds: Normal breath sounds.  Abdominal:     General: Abdomen is flat.     Palpations: Abdomen is soft.  Musculoskeletal:     Cervical back: Normal range of motion and neck supple.     Comments: Decreased range of motion right hip  Skin:    General: Skin is warm and dry.  Neurological:     General: No focal deficit present.     Comments: Oriented to person  Psychiatric:        Mood and Affect: Mood normal.        Behavior: Behavior normal.     Data Reviewed:  There are no new results to review at this time.  Family Communication: None  Disposition: Status is: Inpatient Remains inpatient appropriate because: Awaiting discharge to a skilled nursing facility  Planned Discharge Destination: Skilled nursing facility    Time spent: 30 minutes  Author: Oran Rein, MD 08/31/2022 2:55 PM  For on call review [email or web address].

## 2022-08-31 NOTE — Progress Notes (Signed)
Patient in bed, mostly sleeping with minimal participation. Has not eaten breakfast or lunch. Mouth care done. No sign of acute distress noted at this time.

## 2022-08-31 NOTE — Progress Notes (Signed)
Pt very lethargic this date. Did not open eyes during ROM of B LE's or repositioning. No facial grimacing noted with Right hip ranging. Pt not responding to verbal cues. Unable to safely attempt mobility this date due to current LOC and multiple co-morbidities. Will continue to progress as able.    08/31/22 1400  PT Visit Information  Assistance Needed +2  History of Present Illness Recently in hospital 08/15/22-08/22/22 for femur fracture after fall at his facility [facility]). He underwent right intramedullary nail intertrochanteric on 08/16/2022. DIscharged to SNF Rehab at [facility].      Returned to ED early yesterday AM approx 03:00 for "seizure like activity" per staff - states the patient was witnessed flickering his eye lids for about 30 seconds and he had not been sleeping since he arrived at that [place] care facility two days prior. There was no witnessed tonic-clonic movement, frothing at the mouth, eyes rolling in the top of his head, tongue biting, urinary incontinence or postictal state.  Patient only complaint was some soreness from a fall he had two ago. Was not sent for evaluation following that fall. No LOC. Daughter expressed frustration and displeasure at patient's current SNF due to fall and she did not like the care that he has been receiving there. ED placed consult for social work for alternate SNF placement. Pt remains agitated.  Subjective Data  Subjective none  Patient Stated Goal none stated  Precautions  Precautions Fall  Precaution Comments Per chart, pt is legally blind, Recent R hip surgery  Restrictions  Weight Bearing Restrictions Yes  RLE Weight Bearing WBAT  Pain Assessment  Pain Assessment No/denies pain  Cognition  Arousal/Alertness Lethargic  Behavior During Therapy  (Eyes closed throughout session, not responding to questions)  Overall Cognitive Status No family/caregiver present to determine baseline cognitive functioning  General  Comments  (Lethargic this date)  Bed Mobility  Overal bed mobility Needs Assistance  Bed Mobility Rolling  Rolling Max assist  General bed mobility comments Increased assist due to lethargy, unable to attain EOB  Transfers  General transfer comment Pt unable to awake enough for transfers  General Comments  General comments (skin integrity, edema, etc.)  (Pt repositioned in bed to Left side after ROM exercises to improve upright posture and maintain skin integrity.)  Exercises  Exercises General Lower Extremity  General Exercises - Lower Extremity  Ankle Circles/Pumps AAROM;PROM;Both;15 reps  Heel Slides AAROM;PROM;Right;10 reps  Hip ABduction/ADduction AAROM;PROM;10 reps  Straight Leg Raises AAROM;PROM;Right;5 reps  PT - End of Session  Activity Tolerance Patient limited by lethargy  Patient left in bed;with call bell/phone within reach;with bed alarm set  Nurse Communication Mobility status   PT - Assessment/Plan  PT Plan Current plan remains appropriate  PT Visit Diagnosis Unsteadiness on feet (R26.81);Other abnormalities of gait and mobility (R26.89);Muscle weakness (generalized) (M62.81)  PT Frequency (ACUTE ONLY) 7X/week  Recommendations for Other Services OT consult  Follow Up Recommendations Skilled nursing-short term rehab (<3 hours/day)  Can patient physically be transported by private vehicle No  Assistance recommended at discharge Frequent or constant Supervision/Assistance  Patient can return home with the following Two people to help with walking and/or transfers;Two people to help with bathing/dressing/bathroom;Assistance with cooking/housework;Assistance with feeding;Direct supervision/assist for medications management;Direct supervision/assist for financial management;Assist for transportation;Help with stairs or ramp for entrance  PT equipment None recommended by PT  AM-PAC PT "6 Clicks" Mobility Outcome Measure (Version 2)  Help needed turning from your back to  your side  while in a flat bed without using bedrails? 2  Help needed moving from lying on your back to sitting on the side of a flat bed without using bedrails? 2  Help needed moving to and from a bed to a chair (including a wheelchair)? 2  Help needed standing up from a chair using your arms (e.g., wheelchair or bedside chair)? 2  Help needed to walk in hospital room? 1  Help needed climbing 3-5 steps with a railing?  1  6 Click Score 10  Consider Recommendation of Discharge To: CIR/SNF/LTACH  PT Time Calculation  PT Start Time (ACUTE ONLY) 1212  PT Stop Time (ACUTE ONLY) 1233  PT Time Calculation (min) (ACUTE ONLY) 21 min  PT General Charges  $$ ACUTE PT VISIT 1 Visit  PT Treatments  $Therapeutic Exercise 8-22 mins   [name], PTA

## 2022-09-01 NOTE — Progress Notes (Signed)
.. Progress Note   Patient: Jordan [name] OBS:[id number] DOB: 11-28-1922 DOA: 08/25/2022     6 DOS: the patient was seen and examined on 09/01/2022   Brief hospital course: Recently in hospital 08/15/22-08/22/22 for femur fracture after fall at his facility [facility]). He underwent right intramedullary nail intertrochanteric on 08/16/2022. DIscharged to SNF Rehab at [facility].    Returned to ED early AM approx 03:00 for "seizure like activity" per staff - states the patient was witnessed flickering his eye lids for about 30 seconds and he had not been sleeping since he arrived at that [place] care facility two days prior. There was no witnessed tonic-clonic movement, frothing at the mouth, eyes rolling in the top of his head, tongue biting, urinary incontinence or postictal state.  Patient only complaint was some soreness from a fall he had two ago. Was not sent for evaluation following that fall. No LOC. Daughter expressed frustration and displeasure at patient's current SNF due to fall and she did not like the care that he has been receiving there. ED placed consult for social work for alternate SNF placement.    As of early AM 08/26/22, patient had been boarding for the past 26+ hours in the ED pending TOC placement.  Staff alerted ED MD that patient had been sleeping all day and night, not gotten up and not eaten or drank anything, when they changed his brief, there was gross blood anteriorly over the diaper, around his urethral meatus.  ED MD ordered repeat labs and urinalysis which were distinctly different than the initial diagnostics done about 24 hours prior. Started ceftriaxone, consulted hospitalist for admission. HR and RR increased and UA concerning for UTI = sepsis.  02/10: Admitted to hospitalist service. Continuing abx pending cultures. 02/1: Per RN very agitated and combative overnight, requiring sitter. UCx 02/11 (+)GNR 02/12: remains confused, needs to be  without sitter or mittens 48h for SNF to accept back. Increased seroquel, trial d/c sitter today. UCx(+) Enterobacter aerogenes (S)ceftriaxone, continue IV abx while here  02/13: more alert today. Has not needed sitter or IV/IM meds since yesterday at noon. Placement still pending.  02/14: Pleasant during my evaluation.  Assisted with meals by an nurse tech.  Has not required a sitter. 2/15 patient seen in the morning sleeping at the time of my evaluation.  Minimal participation in PT.  Recommendation SNF with short-term rehab.  Pending placement. Patient seen and examined this morning.  Sleeping in bed.  Vital labs and imaging reviewed.  Daughter and granddaughter updated about the status.  Agreeing to palliative/hospice.  Hospice evaluation pending.  Assessment and Plan:  Principal Problem:   Sepsis due to gram-negative UTI [facility]) Active Problems:   Femur fracture (HCC)   Dementia with behavioral disturbance (HCC)   Chronic diastolic CHF (congestive heart failure) (HCC)   Type 2 diabetes mellitus with complication (HCC)   Hypertension   Diverticulosis of colon without hemorrhage   Hyperlipidemia   Benign prostatic hyperplasia with urinary obstruction   Malnutrition of moderate degree   Sepsis due to Enterobacter aerogenes ([place]) - sepsis parameters have resolved Ceftriaxone while here - today 08/29/22 is Day 4  Patient remains on   Femur fracture [facility]) s/p repair Pain control PT/OT   Chronic diastolic CHF (congestive heart failure) ([place]) Hypertension Hyperlipidemia CHF not in exacerbation Continue home ASA, statin, beta blocker   Type 2 diabetes mellitus with complication ([place]) Only on metformin at home A1C 7.2 2 mos ago - decent  control, at goal for age  Hold metformin for now  Monitor Glc ac/hs x24 h Initiate SSI if needed   Benign prostatic hyperplasia with urinary obstruction Continue Myrbetriq, finasteride   Malnutrition of moderate degree Nutrition supplementation  w/ Ensure    Dementia with behavioral disturbance (HCC) Agitation increased likely d/t sundowning and UTI Increased Seroquel has helped  Continue Namenda Redirect as able/as needed  Has not required a sitter        Physical Exam: Vitals:   08/31/22 1636 08/31/22 2324 09/01/22 0912 09/01/22 1310  BP: (!) 150/47 (!) 113/40 (!) 144/66   Pulse: 73 73 95 95  Resp: 17 20 (!) 25   Temp: 97.8 F (36.6 C) 98.2 F (36.8 C) 99.3 F (37.4 C) 98.3 F (36.8 C)  TempSrc:    Axillary  SpO2: 100% 100% 96% 95%  Height:       Physical Exam Vitals and nursing note reviewed.  Constitutional:      Comments: Sitting by bedside  Eyes closed   HENT:     Head: Normocephalic and atraumatic.     Ears:     Comments: Hard of hearing    Nose: Nose normal.     Mouth/Throat:     Mouth: Mucous membranes are moist.  Eyes:     Conjunctiva/sclera: Conjunctivae normal.  Cardiovascular:     Rate and Rhythm: Normal rate and regular rhythm.  Pulmonary:     Effort: Pulmonary effort is normal.     Breath sounds: Normal breath sounds.  Abdominal:     General: Abdomen is flat.     Palpations: Abdomen is soft.  Musculoskeletal:     Cervical back: Normal range of motion and neck supple.     Comments: Decreased range of motion right hip  Skin:    General: Skin is warm and dry.  Neurological:     General: No focal deficit present.     Comments: Oriented to person  Psychiatric:        Mood and Affect: Mood normal.        Behavior: Behavior normal.     Data Reviewed:  There are no new results to review at this time.  Family Communication: None  Disposition: Status is: Inpatient Remains inpatient appropriate because: Awaiting discharge to a skilled nursing facility  Planned Discharge Destination: Skilled nursing facility    Time spent: 30 minutes  Author: Oran Rein, MD 09/01/2022 3:19 PM  For on call review [email or web address].

## 2022-09-01 NOTE — Progress Notes (Signed)
Physical Therapy Treatment Patient Details Name: Jordan [name] MRN: [id number] DOB: 11-23-1922 Today's Date: 09/01/2022   History of Present Illness Recently in hospital 08/15/22-08/22/22 for femur fracture after fall at his facility [facility]). He underwent right intramedullary nail intertrochanteric on 08/16/2022. DIscharged to SNF Rehab at [facility].      Returned to ED early yesterday AM approx 03:00 for "seizure like activity" per staff - states the patient was witnessed flickering his eye lids for about 30 seconds and he had not been sleeping since he arrived at that [place] care facility two days prior. There was no witnessed tonic-clonic movement, frothing at the mouth, eyes rolling in the top of his head, tongue biting, urinary incontinence or postictal state.  Patient only complaint was some soreness from a fall he had two ago. Was not sent for evaluation following that fall. No LOC. Daughter expressed frustration and displeasure at patient's current SNF due to fall and she did not like the care that he has been receiving there. ED placed consult for social work for alternate SNF placement. Pt remains agitated.    PT Comments    Pt resting in bed upon arrival, able to respond "good morning" with eyes remaining closed. Attempted B LE ROM after discussion with family regarding role of PT and current LOF. Pt resistant to movement as well as initial repositioning in bed due to declining cognitive status. Pt was assisted to EOB when pt suddenly felt the need to urinate, sitting at edge for ~1 minute with CGA. Pt assisted back to supine, positioned on Right side, pillows for facilitation of skin integrity. Pt left with family, awaiting Paliative/Hospice Consult today.   Recommendations for follow up therapy are one component of a multi-disciplinary discharge planning process, led by the attending physician.  Recommendations may be updated based on patient status, additional  functional criteria and insurance authorization.  Follow Up Recommendations  Skilled nursing-short term rehab (<3 hours/day) Can patient physically be transported by private vehicle: No   Assistance Recommended at Discharge Frequent or constant Supervision/Assistance  Patient can return home with the following Two people to help with walking and/or transfers;Two people to help with bathing/dressing/bathroom;Assistance with cooking/housework;Assistance with feeding;Direct supervision/assist for medications management;Direct supervision/assist for financial management;Assist for transportation;Help with stairs or ramp for entrance   Equipment Recommendations  None recommended by PT    Recommendations for Other Services       Precautions / Restrictions Precautions Precautions: Fall Precaution Comments: Per chart, pt is legally blind, Recent R hip surgery Restrictions Weight Bearing Restrictions: Yes RLE Weight Bearing: Weight bearing as tolerated     Mobility  Bed Mobility Overal bed mobility: Needs Assistance Bed Mobility: Supine to Sit     Supine to sit: Mod assist, HOB elevated Sit to supine: Max assist, +2 for safety/equipment   General bed mobility comments:  (Pt spontaneously decided to sit EOB due to urge to urinate.)    Transfers                   General transfer comment: Pt unable to awake enough for transfers    Ambulation/Gait               General Gait Details: non-ambulatory   Stairs             Wheelchair Mobility    Modified Rankin (Stroke Patients Only)       Balance Overall balance assessment: Needs assistance Sitting-balance support: Bilateral upper extremity supported, Feet  supported Sitting balance-Leahy Scale: Fair Sitting balance - Comments:  (CGA to prevent unsafe LOB due to confusion)                                    Cognition Arousal/Alertness: Lethargic Behavior During Therapy: WFL for tasks  assessed/performed Overall Cognitive Status: Impaired/Different from baseline Area of Impairment: Orientation, Attention, Memory, Following commands, Safety/judgement, Awareness, Problem solving                 Orientation Level: Disoriented to, Person, Place, Time, Situation Current Attention Level: Alternating Memory: Decreased recall of precautions, Decreased short-term memory Following Commands: Follows one step commands inconsistently Safety/Judgement: Decreased awareness of safety, Decreased awareness of deficits     General Comments: unoriented to time, situation, and safety. Occasional hallucinations. However pleasant and cooperative        Exercises      General Comments General comments (skin integrity, edema, etc.):  (Education provided to family regarding role of PT and pt's current LOF and struggle with functional progression. All questions and concerns addressed)      Pertinent Vitals/Pain      Home Living                          Prior Function            PT Goals (current goals can now be found in the care plan section) Acute Rehab PT Goals Patient Stated Goal: none stated    Frequency    7X/week      PT Plan Current plan remains appropriate    Co-evaluation              AM-PAC PT "6 Clicks" Mobility   Outcome Measure  Help needed turning from your back to your side while in a flat bed without using bedrails?: A Lot Help needed moving from lying on your back to sitting on the side of a flat bed without using bedrails?: A Lot Help needed moving to and from a bed to a chair (including a wheelchair)?: A Lot Help needed standing up from a chair using your arms (e.g., wheelchair or bedside chair)?: A Lot Help needed to walk in hospital room?: Total Help needed climbing 3-5 steps with a railing? : Total 6 Click Score: 10    End of Session   Activity Tolerance: Patient limited by lethargy Patient left: in bed;with call  bell/phone within reach;with bed alarm set;with family/visitor present Nurse Communication: Mobility status PT Visit Diagnosis: Unsteadiness on feet (R26.81);Other abnormalities of gait and mobility (R26.89);Muscle weakness (generalized) (M62.81)     Time: 1000-1030 PT Time Calculation (min) (ACUTE ONLY): 30 min  Charges:  $Therapeutic Exercise: 8-22 mins $Therapeutic Activity: 8-22 mins                    [name], PTA   Jordan [name] 09/01/2022, 12:52 PM

## 2022-09-01 NOTE — Plan of Care (Signed)

## 2022-09-01 NOTE — Care Management Important Message (Signed)
Important Message  Patient Details  Name: Jordan [name] MRN: [id number] Date of Birth: [birth year]   Medicare Important Message Given:  Yes     [name] 09/01/2022, 2:27 PM

## 2022-09-02 LAB — CREATININE, SERUM
Creatinine, Ser: 1.09 mg/dL (ref 0.61–1.24)
GFR, Estimated: >60 mL/min (ref >60)

## 2022-09-02 NOTE — Progress Notes (Signed)
Patient's daughter [name]) had questions regarding the staples in the patient's right leg. The daughter was told a note will be written to be addressed by the day team.

## 2022-09-02 NOTE — Plan of Care (Signed)

## 2022-09-02 NOTE — Progress Notes (Signed)
... Progress Note   Patient: Jordan [name] UVO:[id number] DOB: 1923-05-11 DOA: 08/25/2022     7 DOS: the patient was seen and examined on 09/02/2022   Brief hospital course: Recently in hospital 08/15/22-08/22/22 for femur fracture after fall at his facility [facility]). He underwent right intramedullary nail intertrochanteric on 08/16/2022. DIscharged to SNF Rehab at [facility].    Returned to ED early AM approx 03:00 for "seizure like activity" per staff - states the patient was witnessed flickering his eye lids for about 30 seconds and he had not been sleeping since he arrived at that [place] care facility two days prior. There was no witnessed tonic-clonic movement, frothing at the mouth, eyes rolling in the top of his head, tongue biting, urinary incontinence or postictal state.  Patient only complaint was some soreness from a fall he had two ago. Was not sent for evaluation following that fall. No LOC. Daughter expressed frustration and displeasure at patient's current SNF due to fall and she did not like the care that he has been receiving there. ED placed consult for social work for alternate SNF placement.    As of early AM 08/26/22, patient had been boarding for the past 26+ hours in the ED pending TOC placement.  Staff alerted ED MD that patient had been sleeping all day and night, not gotten up and not eaten or drank anything, when they changed his brief, there was gross blood anteriorly over the diaper, around his urethral meatus.  ED MD ordered repeat labs and urinalysis which were distinctly different than the initial diagnostics done about 24 hours prior. Started ceftriaxone, consulted hospitalist for admission. HR and RR increased and UA concerning for UTI = sepsis.  02/10: Admitted to hospitalist service. Continuing abx pending cultures. 02/1: Per RN very agitated and combative overnight, requiring sitter. UCx 02/11 (+)GNR 02/12: remains confused, needs to be  without sitter or mittens 48h for SNF to accept back. Increased seroquel, trial d/c sitter today. UCx(+) Enterobacter aerogenes (S)ceftriaxone, continue IV abx while here  02/13: more alert today. Has not needed sitter or IV/IM meds since yesterday at noon. Placement still pending.  02/14: Pleasant during my evaluation.  Assisted with meals by an nurse tech.  Has not required a sitter. 2/15 patient seen in the morning sleeping at the time of my evaluation.  Minimal participation in PT.  Recommendation SNF with short-term rehab.  Pending placement. Patient seen and examined this morning.  Sleeping in bed.  Vital labs and imaging reviewed.  Daughter and granddaughter updated about the status.  Agreeing to palliative/hospice.  Patient seen this morning.  Sitting in the bed with eyes closed.  Opens eyes to verbal stimuli.  No overnight events.  Follow repeat urine.  Follow blood cultures to de-escalate antibiotics.  Patient pending palliative eval  Assessment and Plan:  Principal Problem:   Sepsis due to gram-negative UTI [facility]) Active Problems:   Femur fracture (HCC)   Dementia with behavioral disturbance (HCC)   Chronic diastolic CHF (congestive heart failure) (HCC)   Type 2 diabetes mellitus with complication (HCC)   Hypertension   Diverticulosis of colon without hemorrhage   Hyperlipidemia   Benign prostatic hyperplasia with urinary obstruction   Malnutrition of moderate degree   Sepsis due to Enterobacter aerogenes ([place]) - sepsis parameters have resolved Ceftriaxone while here - today 09/02/22 is Day 8 (cultures repeated today.) Patient remains on   Femur fracture [facility]) s/p repair Pain control PT/OT   Chronic diastolic CHF (  congestive heart failure) ([place]) Hypertension Hyperlipidemia CHF not in exacerbation Continue home ASA, statin, beta blocker   Type 2 diabetes mellitus with complication (HCC) Only on metformin at home A1C 7.2 2 mos ago - decent control, at goal for age  Hold  metformin for now  Monitor Glc ac/hs x24 h Initiate SSI if needed   Benign prostatic hyperplasia with urinary obstruction Continue Myrbetriq, finasteride   Malnutrition of moderate degree Nutrition supplementation w/ Ensure    Dementia with behavioral disturbance (HCC) Agitation increased likely d/t sundowning and UTI Increased Seroquel has helped  Continue Namenda Redirect as able/as needed  Has not required a sitter        Physical Exam: Vitals:   09/01/22 1310 09/01/22 1526 09/01/22 2316 09/02/22 0741  BP:  133/60 (!) 128/50 138/78  Pulse: 95 91 96 88  Resp:  20 18 17   Temp: 98.3 F (36.8 C) 98.7 F (37.1 C) 99.3 F (37.4 C) 98.1 F (36.7 C)  TempSrc: Axillary   Oral  SpO2: 95% 98% 98% 97%  Height:       Physical Exam Vitals and nursing note reviewed.  Constitutional:      Comments: Sitting in bed with eyes closed opens to verbal stimuli.   HENT:     Head: Normocephalic and atraumatic.     Ears:     Comments: Hard of hearing    Nose: Nose normal.     Mouth/Throat:     Mouth: Mucous membranes are moist.  Eyes:     Conjunctiva/sclera: Conjunctivae normal.  Cardiovascular:     Rate and Rhythm: Normal rate and regular rhythm.  Pulmonary:     Effort: Pulmonary effort is normal.     Breath sounds: Normal breath sounds.  Abdominal:     General: Abdomen is flat.     Palpations: Abdomen is soft.  Musculoskeletal:     Cervical back: Normal range of motion and neck supple.  Skin:    General: Skin is warm and dry.  Neurological:     General: No focal deficit present.     Comments: Oriented to person  Psychiatric:        Mood and Affect: Mood normal.        Behavior: Behavior normal.     Data Reviewed:  There are no new results to review at this time.  Family Communication: None  Disposition: Status is: Inpatient Remains inpatient appropriate because: Awaiting discharge to a skilled nursing facility  Planned Discharge Destination: Skilled nursing  facility    Time spent: 30 minutes  Author: Oran Rein, MD 09/02/2022 1:58 PM  For on call review [email or web address].

## 2022-09-02 NOTE — Progress Notes (Signed)
[place] [facility]) Hospice hospital liaison note  Received request from [facility] for family interest in hospice inpatient unit.   Chart reviewed and inpatient unit eligibility is PENDING at this time. Have talked with daughter by phone and visited patient at bedside to acknowledge referral and explain services.   Family and TOC aware that liaison will follow up tomorrow or sooner if decision is made.   Please do not hesitate to call with any hospice related questions or concerns.   Thank you for the opportunity to participate in this patient's care.  Jordan [name], Therapist, sports, [facility] Liaison  [phone]

## 2022-09-02 NOTE — Progress Notes (Signed)
Physical Therapy Treatment Patient Details Name: Jordan [name] MRN: [id number] DOB: 1923-02-09 Today's Date: 09/02/2022   History of Present Illness Recently in hospital 08/15/22-08/22/22 for femur fracture after fall at his facility [facility]). He underwent right intramedullary nail intertrochanteric on 08/16/2022. DIscharged to SNF Rehab at [facility].      Returned to ED early yesterday AM approx 03:00 for "seizure like activity" per staff - states the patient was witnessed flickering his eye lids for about 30 seconds and he had not been sleeping since he arrived at that [place] care facility two days prior. There was no witnessed tonic-clonic movement, frothing at the mouth, eyes rolling in the top of his head, tongue biting, urinary incontinence or postictal state.  Patient only complaint was some soreness from a fall he had two ago. Was not sent for evaluation following that fall. No LOC. Daughter expressed frustration and displeasure at patient's current SNF due to fall and she did not like the care that he has been receiving there. ED placed consult for social work for alternate SNF placement. Pt remains agitated.    PT Comments    Pt easily awakens.  Answers "yes" to name but no other attempts to vocalize.  BLE PROM.  Pt resists LE ROM about 50% of time then allows the other 50% but does not attempt to assist despite cues.  Encouraged EOB but pt does not make any attempts to assist and at times will cross his arms on his chest and resist.  Per RN, pt was trying to get OOB last night at shift change so mobility seems to be a bit more cognitive/resistance  related than weakness at this time.   Recommendations for follow up therapy are one component of a multi-disciplinary discharge planning process, led by the attending physician.  Recommendations may be updated based on patient status, additional functional criteria and insurance authorization.  Follow Up  Recommendations  Skilled nursing-short term rehab (<3 hours/day)     Assistance Recommended at Discharge Frequent or constant Supervision/Assistance  Patient can return home with the following Two people to help with walking and/or transfers;Two people to help with bathing/dressing/bathroom;Assistance with cooking/housework;Assistance with feeding;Direct supervision/assist for medications management;Direct supervision/assist for financial management;Assist for transportation;Help with stairs or ramp for entrance   Equipment Recommendations       Recommendations for Other Services       Precautions / Restrictions Precautions Precautions: Fall Precaution Comments: Per chart, pt is legally blind, Recent R hip surgery Restrictions Weight Bearing Restrictions: Yes RLE Weight Bearing: Weight bearing as tolerated     Mobility  Bed Mobility               General bed mobility comments: resists movements so deferred    Transfers                        Ambulation/Gait                   Stairs             Wheelchair Mobility    Modified Rankin (Stroke Patients Only)       Balance                                            Cognition Arousal/Alertness: Awake/alert Behavior During Therapy: Flat affect Overall Cognitive  Status: Impaired/Different from baseline                                 General Comments: only answers "yes" once to his name.  no other attempts to speak,        Exercises Other Exercises Other Exercises: BLE PROM - resists at times, others allows but does not atttempt to assist.    General Comments        Pertinent Vitals/Pain Pain Assessment Pain Assessment: Faces Faces Pain Scale: Hurts a little bit Pain Location: generally comfortable at rest, does grimmace/resist some with ROM but no verbal c/o Pain Descriptors / Indicators: Sore Pain Intervention(s): Limited activity within  patient's tolerance, Monitored during session    Home Living                          Prior Function            PT Goals (current goals can now be found in the care plan section) Progress towards PT goals: Not progressing toward goals - comment    Frequency    7X/week      PT Plan Current plan remains appropriate    Co-evaluation              AM-PAC PT "6 Clicks" Mobility   Outcome Measure  Help needed turning from your back to your side while in a flat bed without using bedrails?: Total Help needed moving from lying on your back to sitting on the side of a flat bed without using bedrails?: Total Help needed moving to and from a bed to a chair (including a wheelchair)?: Total Help needed standing up from a chair using your arms (e.g., wheelchair or bedside chair)?: Total Help needed to walk in hospital room?: Total Help needed climbing 3-5 steps with a railing? : Total 6 Click Score: 6    End of Session   Activity Tolerance: Other (comment) Patient left: in bed;with call bell/phone within reach;with bed alarm set Nurse Communication: Mobility status PT Visit Diagnosis: Unsteadiness on feet (R26.81);Other abnormalities of gait and mobility (R26.89);Muscle weakness (generalized) (M62.81)     Time: 5397-6734 PT Time Calculation (min) (ACUTE ONLY): 11 min  Charges:  $Therapeutic Exercise: 8-22 mins                   Chesley Noon, PTA 09/02/22, 9:49 AM

## 2022-09-03 DIAGNOSIS — E860 Dehydration: Secondary | ICD-10-CM

## 2022-09-03 DIAGNOSIS — S72144A Nondisplaced intertrochanteric fracture of right femur, initial encounter for closed fracture: Secondary | ICD-10-CM

## 2022-09-03 LAB — URINALYSIS, COMPLETE (UACMP) WITH MICROSCOPIC
Bilirubin Urine: NEGATIVE
Glucose, UA: NEGATIVE mg/dL
Hgb urine dipstick: NEGATIVE
Ketones, ur: 5 mg/dL — AB
Nitrite: NEGATIVE
Protein, ur: 30 mg/dL — AB
Specific Gravity, Urine: 1.024 (ref 1.005–1.030)
Squamous Epithelial / HPF: NONE SEEN /HPF (ref 0–5)
WBC, UA: >50 WBC/hpf (ref 0–5)
pH: 5 (ref 5.0–8.0)

## 2022-09-03 LAB — BASIC METABOLIC PANEL
Anion gap: 6 (ref 5–15)
BUN: 40 mg/dL — ABNORMAL HIGH (ref 8–23)
CO2: 28 mmol/L (ref 22–32)
Calcium: 7.6 mg/dL — ABNORMAL LOW (ref 8.9–10.3)
Chloride: 106 mmol/L (ref 98–111)
Creatinine, Ser: 0.88 mg/dL (ref 0.61–1.24)
GFR, Estimated: >60 mL/min (ref >60)
Glucose, Bld: 112 mg/dL — ABNORMAL HIGH (ref 70–99)
Potassium: 3.4 mmol/L — ABNORMAL LOW (ref 3.5–5.1)
Sodium: 140 mmol/L (ref 135–145)

## 2022-09-03 LAB — CBC
HCT: 24.8 % — ABNORMAL LOW (ref 39.0–52.0)
Hemoglobin: 7.9 g/dL — ABNORMAL LOW (ref 13.0–17.0)
MCH: 28.8 pg (ref 26.0–34.0)
MCHC: 31.9 g/dL (ref 30.0–36.0)
MCV: 90.5 fL (ref 80.0–100.0)
Platelets: 465 10*3/uL — ABNORMAL HIGH (ref 150–400)
RBC: 2.74 MIL/uL — ABNORMAL LOW (ref 4.22–5.81)
RDW: 13.8 % (ref 11.5–15.5)
WBC: 7.3 10*3/uL (ref 4.0–10.5)
nRBC: 0 % (ref 0.0–0.2)

## 2022-09-03 MED ORDER — QUETIAPINE FUMARATE 25 MG PO TABS
50.0000 mg | ORAL_TABLET | Freq: Every day | ORAL | Status: DC
  Administered 2022-09-03 – 2022-09-06 (×4): 50 mg via ORAL
  Filled 2022-09-03 (×4): qty 2

## 2022-09-03 MED ORDER — POTASSIUM CHLORIDE 20 MEQ PO PACK
40.0000 meq | PACK | Freq: Two times a day (BID) | ORAL | Status: AC
  Administered 2022-09-03 – 2022-09-04 (×4): 40 meq via ORAL
  Filled 2022-09-03 (×4): qty 2

## 2022-09-03 NOTE — Progress Notes (Addendum)
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 Patients intake (both breakfast & lunch) remains high & patient is not requiring any ongoing symptom management needs.  However, patient can be followed at alternative DC location. [facility] to continue to follow peripherally to provide hospice services once DC plan in place.    Please do not hesitate to call with any hospice related questions or concerns.    Thank you for the opportunity to participate in this patient's care.   Phillis Haggis, MSW [facility]  [phone]

## 2022-09-03 NOTE — Progress Notes (Signed)
Physical Therapy Treatment Patient Details Name: Jordan [name] MRN: [id number] DOB: 1922/08/09 Today's Date: 09/03/2022   History of Present Illness Recently in hospital 08/15/22-08/22/22 for femur fracture after fall at his facility [facility]). He underwent right intramedullary nail intertrochanteric on 08/16/2022. DIscharged to SNF Rehab at [facility].      Returned to ED early yesterday AM approx 03:00 for "seizure like activity" per staff - states the patient was witnessed flickering his eye lids for about 30 seconds and he had not been sleeping since he arrived at that [place] care facility two days prior. There was no witnessed tonic-clonic movement, frothing at the mouth, eyes rolling in the top of his head, tongue biting, urinary incontinence or postictal state.  Patient only complaint was some soreness from a fall he had two ago. Was not sent for evaluation following that fall. No LOC. Daughter expressed frustration and displeasure at patient's current SNF due to fall and she did not like the care that he has been receiving there. ED placed consult for social work for alternate SNF placement. Pt remains agitated.    PT Comments    Pt awake this morning, engaged and talkative.  Appropriate conversations.  RN stated no serequel today.  He is able to complete x 10 supine LE ex x 10 with only initial tactile cues to teach ex then completes on his own.  He is able to transition to EOB with mod a x 1 but does attempt to assist.  Once seated, he is able to sit unsupported x 5 minutes while awaiting +2 assist to chair.  Stands to RW with min/mod a x 2 and he is able to take several fait quality steps with walker to recliner at bedside.  He is happy to be up and asks for "a warm blanket".  Tech gets one for him and he stated "that feels good".  Pt remains up with need and safety precautions in place.    Pt with excellent progress and participation today.     Recommendations for  follow up therapy are one component of a multi-disciplinary discharge planning process, led by the attending physician.  Recommendations may be updated based on patient status, additional functional criteria and insurance authorization.  Follow Up Recommendations  Skilled nursing-short term rehab (<3 hours/day)     Assistance Recommended at Discharge Frequent or constant Supervision/Assistance  Patient can return home with the following Two people to help with walking and/or transfers;Two people to help with bathing/dressing/bathroom;Assistance with cooking/housework;Assistance with feeding;Direct supervision/assist for medications management;Direct supervision/assist for financial management;Assist for transportation;Help with stairs or ramp for entrance   Equipment Recommendations       Recommendations for Other Services       Precautions / Restrictions Precautions Precautions: Fall Precaution Comments: Per chart, pt is legally blind, Recent R hip surgery Restrictions Weight Bearing Restrictions: Yes RLE Weight Bearing: Weight bearing as tolerated     Mobility  Bed Mobility Overal bed mobility: Needs Assistance Bed Mobility: Supine to Sit     Supine to sit: Mod assist     General bed mobility comments: attemtps to assist but does need some help to complete Patient Response: Cooperative  Transfers Overall transfer level: Needs assistance Equipment used: Rolling walker (2 wheels) Transfers: Sit to/from Stand Sit to Stand: Min assist, Mod assist, +2 physical assistance                Ambulation/Gait Ambulation/Gait assistance: Mod assist, +2 physical assistance, Min assist Gait  Distance (Feet): 3 Feet Assistive device: Rolling walker (2 wheels) Gait Pattern/deviations: Step-to pattern, Antalgic, Trunk flexed, Narrow base of support Gait velocity: decreased     General Gait Details: able to take several fair quality steps to recliner at bedside.   Stairs              Wheelchair Mobility    Modified Rankin (Stroke Patients Only)       Balance Overall balance assessment: Needs assistance Sitting-balance support: Bilateral upper extremity supported, Feet supported Sitting balance-Leahy Scale: Fair Sitting balance - Comments: able to sit unsupported with supervision   Standing balance support: Reliant on assistive device for balance, During functional activity, Bilateral upper extremity supported Standing balance-Leahy Scale: Poor Standing balance comment: +2 for general safety but overall does well and improved from prior sessions                            Cognition Arousal/Alertness: Awake/alert Behavior During Therapy: WFL for tasks assessed/performed Overall Cognitive Status: Within Functional Limits for tasks assessed                                 General Comments: awake and appropriate today.  enagaged and laughing at times.        Exercises Other Exercises Other Exercises: BLE AROM - is able to complete all ex x 10 with light tactile cues to demo exercise intially    General Comments        Pertinent Vitals/Pain Pain Assessment Pain Assessment: No/denies pain    Home Living                          Prior Function            PT Goals (current goals can now be found in the care plan section) Progress towards PT goals: Progressing toward goals    Frequency    7X/week      PT Plan Current plan remains appropriate    Co-evaluation              AM-PAC PT "6 Clicks" Mobility   Outcome Measure  Help needed turning from your back to your side while in a flat bed without using bedrails?: A Little Help needed moving from lying on your back to sitting on the side of a flat bed without using bedrails?: A Lot Help needed moving to and from a bed to a chair (including a wheelchair)?: A Lot Help needed standing up from a chair using your arms (e.g., wheelchair or  bedside chair)?: A Lot Help needed to walk in hospital room?: A Lot Help needed climbing 3-5 steps with a railing? : Total 6 Click Score: 12    End of Session Equipment Utilized During Treatment: Gait belt Activity Tolerance: Patient tolerated treatment well Patient left: in chair;with call bell/phone within reach;with chair alarm set Nurse Communication: Mobility status PT Visit Diagnosis: Unsteadiness on feet (R26.81);Other abnormalities of gait and mobility (R26.89);Muscle weakness (generalized) (M62.81)     Time: 7342-8768 PT Time Calculation (min) (ACUTE ONLY): 15 min  Charges:  $Therapeutic Exercise: 8-22 mins                   Chesley Noon, PTA 09/03/22, 10:36 AM

## 2022-09-03 NOTE — Progress Notes (Addendum)
[facility]... Progress Note   Patient: Jordan [name] OIZ:[id number] DOB: Jan 30, 1923 DOA: 08/25/2022     8 DOS: the patient was seen and examined on 09/03/2022   Brief hospital course: Recently in hospital 08/15/22-08/22/22 for femur fracture after fall at his facility [facility]). He underwent right intramedullary nail intertrochanteric on 08/16/2022. DIscharged to SNF Rehab at [facility].    Returned to ED early AM approx 03:00 for "seizure like activity" per staff - states the patient was witnessed flickering his eye lids for about 30 seconds and he had not been sleeping since he arrived at that [place] care facility two days prior. There was no witnessed tonic-clonic movement, frothing at the mouth, eyes rolling in the top of his head, tongue biting, urinary incontinence or postictal state.  Patient only complaint was some soreness from a fall he had two ago. Was not sent for evaluation following that fall. No LOC. Daughter expressed frustration and displeasure at patient's current SNF due to fall and she did not like the care that he has been receiving there. ED placed consult for social work for alternate SNF placement.    As of early AM 08/26/22, patient had been boarding for the past 26+ hours in the ED pending TOC placement.  Staff alerted ED MD that patient had been sleeping all day and night, not gotten up and not eaten or drank anything, when they changed his brief, there was gross blood anteriorly over the diaper, around his urethral meatus.  ED MD ordered repeat labs and urinalysis which were distinctly different than the initial diagnostics done about 24 hours prior. Started ceftriaxone, consulted hospitalist for admission. HR and RR increased and UA concerning for UTI = sepsis.  02/10: Admitted to hospitalist service. Continuing abx pending cultures. 02/1: Per RN very agitated and combative overnight, requiring sitter. UCx 02/11 (+)GNR 02/12: remains confused, needs to be  without sitter or mittens 48h for SNF to accept back. Increased seroquel, trial d/c sitter today. UCx(+) Enterobacter aerogenes (S)ceftriaxone, continue IV abx while here  02/13: more alert today. Has not needed sitter or IV/IM meds since yesterday at noon. Placement still pending.  02/14: Pleasant during my evaluation.  Assisted with meals by an nurse tech.  Has not required a sitter. 2/15 patient seen in the morning sleeping at the time of my evaluation.  Minimal participation in PT.  Recommendation SNF with short-term rehab.  Pending placement. Patient seen and examined this morning.  Sleeping in bed.  Vital labs and imaging reviewed.  Daughter and granddaughter updated about the status.  Agreeing to palliative/hospice.  Patient seen this morning.  Sitting in the bed with eyes closed.  Opens eyes to verbal stimuli.  No overnight events.  Follow repeat urine.  Follow blood cultures to de-escalate antibiotics.  Patient pending palliative eval Patient seen and examined this morning.  No overnight event.  Daughter updated about the status last evening.  Concerned about decline by hospice.  Patient more alert and awake today tolerated diet.  Urine cultures pending.  Patient pending placement to SNF.  Antibiotics ceftriaxone can be discontinued tomorrow if cultures remain negative.  Patient has been very somnolent on Seroquel need to adjust the dose was held this morning.  Correct the evening dose to 50 mg.   Assessment and Plan:  Principal Problem:   Sepsis due to gram-negative UTI [facility]) Active Problems:   Femur fracture (HCC)   Dementia with behavioral disturbance (HCC)   Chronic diastolic CHF (congestive heart failure) (  [place])   Type 2 diabetes mellitus with complication (HCC)   Hypertension   Diverticulosis of colon without hemorrhage   Hyperlipidemia   Benign prostatic hyperplasia with urinary obstruction   Malnutrition of moderate degree   Sepsis due to Enterobacter aerogenes ([place]) - sepsis  parameters have resolved Ceftriaxone while here - today 09/03/22 is Day 9(cultures repeated today.) Patient remains on   Femur fracture [facility]) s/p repair Pain control PT/OT   Chronic diastolic CHF (congestive heart failure) ([place]) Hypertension Hyperlipidemia CHF not in exacerbation Continue home ASA, statin, beta blocker   Type 2 diabetes mellitus with complication (HCC) Only on metformin at home A1C 7.2 2 mos ago - decent control, at goal for age  Hold metformin for now  Monitor Glc ac/hs x24 h Initiate SSI if needed   Benign prostatic hyperplasia with urinary obstruction Continue Myrbetriq, finasteride   Malnutrition of moderate degree Nutrition supplementation w/ Ensure    Dementia with behavioral disturbance (HCC) Agitation increased likely d/t sundowning and UTI Increased Seroquel has helped  Continue Namenda Redirect as able/as needed  Has not required a sitter        Physical Exam: Vitals:   09/02/22 0741 09/02/22 1520 09/02/22 2324 09/03/22 0732  BP: 138/78 (!) 146/66 (!) 126/49 136/60  Pulse: 88 84 84 77  Resp: 17 17 15 19   Temp: 98.1 F (36.7 C) 98.3 F (36.8 C) 97.9 F (36.6 C) 97.9 F (36.6 C)  TempSrc: Oral     SpO2: 97% 98% 98% 100%  Height:       Physical Exam Vitals and nursing note reviewed.  Constitutional:      General: He is not in acute distress.    Appearance: He is not toxic-appearing or diaphoretic.     Comments: Sitting in chair responding to verbal stimuli    HENT:     Head: Normocephalic and atraumatic.     Ears:     Comments: Hard of hearing    Nose: Nose normal.     Mouth/Throat:     Mouth: Mucous membranes are moist.  Eyes:     Conjunctiva/sclera: Conjunctivae normal.  Cardiovascular:     Rate and Rhythm: Normal rate and regular rhythm.  Pulmonary:     Effort: Pulmonary effort is normal.     Breath sounds: Normal breath sounds.  Abdominal:     General: Abdomen is flat.     Palpations: Abdomen is soft.   Musculoskeletal:     Cervical back: Normal range of motion and neck supple.  Skin:    General: Skin is warm and dry.  Neurological:     General: No focal deficit present.     Mental Status: He is alert.     Comments: Oriented to person  Psychiatric:        Mood and Affect: Mood normal.        Behavior: Behavior normal.     Data Reviewed:  There are no new results to review at this time.  Family Communication: None  Disposition: Status is: Inpatient Remains inpatient appropriate because: Awaiting discharge to a skilled nursing facility  Planned Discharge Destination: Skilled nursing facility    Time spent: 30 minutes  Author: Oran Rein, MD 09/03/2022 11:17 AM  For on call review [email or web address].

## 2022-09-04 DIAGNOSIS — N390 Urinary tract infection, site not specified: Secondary | ICD-10-CM | POA: Diagnosis not present

## 2022-09-04 DIAGNOSIS — A4150 Gram-negative sepsis, unspecified: Secondary | ICD-10-CM | POA: Diagnosis not present

## 2022-09-04 MED ORDER — OXYBUTYNIN CHLORIDE 5 MG PO TABS
5.0000 mg | ORAL_TABLET | Freq: Two times a day (BID) | ORAL | Status: DC
  Administered 2022-09-04 – 2022-09-07 (×7): 5 mg via ORAL
  Filled 2022-09-04 (×7): qty 1

## 2022-09-04 NOTE — Progress Notes (Addendum)
Progress Note   Patient: Jordan [name] SNK:[id number] DOB: 1923-05-01 DOA: 08/25/2022     9 DOS: the patient was seen and examined on 09/04/2022   Brief hospital course:  Recently in hospital 08/15/22-08/22/22 for femur fracture after fall at his facility [facility]). He underwent right intramedullary nail intertrochanteric on 08/16/2022. DIscharged to SNF Rehab at [facility].    Returned to ED early AM approx 03:00 for "seizure like activity" per staff - states the patient was witnessed flickering his eye lids for about 30 seconds and he had not been sleeping since he arrived at that [facility] care facility two days prior. There was no witnessed tonic-clonic movement, frothing at the mouth, eyes rolling in the top of his head, tongue biting, urinary incontinence or postictal state.  Patient only complaint was some soreness from a fall he had two ago. Was not sent for evaluation following that fall. No LOC. Daughter expressed frustration and displeasure at patient's current SNF due to fall and she did not like the care that he has been receiving there. ED placed consult for social work for alternate SNF placement.    As of early AM 08/26/22, patient had been boarding for the past 26+ hours in the ED pending TOC placement.  Staff alerted ED MD that patient had been sleeping all day and night, not gotten up and not eaten or drank anything, when they changed his brief, there was gross blood anteriorly over the diaper, around his urethral meatus.  ED MD ordered repeat labs and urinalysis which were distinctly different than the initial diagnostics done about 24 hours prior. Started ceftriaxone, consulted hospitalist for admission. HR and RR increased and UA concerning for UTI = sepsis.  02/10: Admitted to hospitalist service. Continuing abx pending cultures. 02/1: Per RN very agitated and combative overnight, requiring sitter. UCx 02/11 (+)GNR 02/12: remains confused, needs to be  without sitter or mittens 48h for SNF to accept back. Increased seroquel, trial d/c sitter today. UCx(+) Enterobacter aerogenes (S)ceftriaxone, continue IV abx while here  02/13: more alert today. Has not needed sitter or IV/IM meds since yesterday at noon. Placement still pending.  02/14: Pleasant during my evaluation.  Assisted with meals by an nurse tech.  Has not required a sitter. 2/15 patient seen in the morning sleeping at the time of my evaluation.  Minimal participation in PT.  Recommendation SNF with short-term rehab.  Pending placement. Patient seen and examined this morning.  Sleeping in bed.  Vital labs and imaging reviewed.  Daughter and granddaughter updated about the status.  Agreeing to palliative/hospice.  Patient seen this morning.  Sitting in the bed with eyes closed.  Opens eyes to verbal stimuli.  No overnight events.  Follow repeat urine.  Follow blood cultures to de-escalate antibiotics.  Patient pending palliative eval Patient seen and examined this morning.  No overnight event.  Daughter updated about the status last evening.  Concerned about decline by hospice.  Patient more alert and awake today tolerated diet.  Urine cultures pending.  Patient pending placement to SNF.  Antibiotics ceftriaxone can be discontinued tomorrow if cultures remain negative.  Patient has been very somnolent on Seroquel need to adjust the dose was held this morning.  Correct the evening dose to 50 mg.  02/19. Patient examined at the bedside.  More awake but confused   Assessment and Plan:  Principal Problem:   Sepsis due to gram-negative UTI [facility]) Active Problems:   Femur fracture (HCC)   Dementia  with behavioral disturbance (HCC)   Chronic diastolic CHF (congestive heart failure) (HCC)   Type 2 diabetes mellitus with complication (HCC)   Hypertension   Diverticulosis of colon without hemorrhage   Hyperlipidemia   Benign prostatic hyperplasia with urinary obstruction   Malnutrition of  moderate degree   Sepsis due to Enterobacter aerogenes ([place]) - sepsis parameters have resolved Treated with IV Rocephin completed 9-day course of therapy    Femur fracture (HCC) s/p repair Pain control PT/OT   Chronic diastolic CHF (congestive heart failure) (HCC) Hypertension Hyperlipidemia CHF not in exacerbation Continue home ASA, statin, beta blocker   Type 2 diabetes mellitus with complication (HCC) Only on metformin at home A1C 7.2 2 mos ago - decent control, at goal for age  Hold metformin for now  Monitor Glc ac/hs x24 h Initiate SSI if needed   Benign prostatic hyperplasia with urinary obstruction Continue Myrbetriq, finasteride   Malnutrition of moderate degree Nutrition supplementation w/ Ensure    Dementia with behavioral disturbance (HCC) Agitation increased likely d/t sundowning and UTI Continue Seroquel decrease dose due to sedation Continue Namenda Redirect as able/as needed  Has not required a sitter            Physical Exam: Vitals:   09/03/22 1619 09/03/22 2306 09/04/22 0743 09/04/22 0815  BP: (!) 135/59 (!) 142/60 (!) 150/68 (!) 152/64  Pulse: 84 88 79   Resp: 16 16 15    Temp: 97.6 F (36.4 C) 97.9 F (36.6 C) 97.7 F (36.5 C)   TempSrc:   Oral   SpO2: 99% 99% 99%   Height:       Physical Exam Vitals and nursing note reviewed.  Constitutional:      Comments: Confused  HENT:     Nose: Nose normal.     Mouth/Throat:     Mouth: Mucous membranes are moist.  Eyes:     Conjunctiva/sclera: Conjunctivae normal.  Cardiovascular:     Rate and Rhythm: Normal rate and regular rhythm.  Pulmonary:     Effort: Pulmonary effort is normal.     Breath sounds: Normal breath sounds.  Abdominal:     General: Abdomen is flat. Bowel sounds are normal.     Palpations: Abdomen is soft.  Musculoskeletal:     Cervical back: Normal range of motion and neck supple.     Comments: Decreased range of motion right hip  Skin:    General: Skin is warm  and dry.  Neurological:     Mental Status: He is alert.     Comments: Unable to assess  Psychiatric:     Comments: Unable to assess     Data Reviewed: Labs reviewed.  Sodium 140, potassium 3.4, chloride 106, bicarb 28, glucose 112, BUN 40, creatinine 0.88, calcium 7.6, white count 7.3, hemoglobin 7.9, hematocrit 24.8, platelet count 465 There are no new results to review at this time.  Family Communication: None  Disposition: Status is: Inpatient Remains inpatient appropriate because: Awaiting discharge to skilled nursing facility  Planned Discharge Destination: Skilled nursing facility    Time spent: 28  minutes  Author: Collier Bullock, MD 09/04/2022 1:02 PM  For on call review [email or web address].

## 2022-09-04 NOTE — TOC Progression Note (Signed)
Transition of Care [facility]) - Progression Note    Patient Details  Name: Jordan [name] MRN: [id number] Date of Birth: 02-27-1923  Transition of Care [facility]) CM/SW Contact  Laurena Slimmer, RN Phone Number: 09/04/2022, 4:29 PM  Clinical Narrative:   Attempt to reach patient's daughter to discuss discharge plan for SNF. No answer. Left a message.    Expected Discharge Plan: [place] Barriers to Discharge: SNF Pending bed offer  Expected Discharge Plan and Services   Discharge Planning Services: CM Consult Post Acute Care Choice: [place] Living arrangements for the past 2 months: Single Family Home                 DME Arranged: [name] DME Agency: NA       HH Arranged: NA           Social Determinants of Health (SDOH) Interventions SDOH Screenings   Food Insecurity: No Food Insecurity (08/26/2022)  Housing: Low Risk  (08/26/2022)  Transportation Needs: No Transportation Needs (08/26/2022)  Utilities: Not At Risk (08/26/2022)  Tobacco Use: Medium Risk (08/26/2022)    Readmission Risk Interventions    07/18/2022    2:18 PM 07/14/2022   11:46 AM 07/11/2022   12:11 PM  Readmission Risk Prevention Plan  Transportation Screening  Complete Complete  PCP or Specialist Appt within 3-5 Days  Complete Complete  HRI or [place]  Complete Complete  Social Work Consult for [place] Planning/Counseling  Complete Complete  Palliative Care Screening  Not Applicable Not Applicable  Medication Review [employer])  Complete Complete  PCP or Specialist appointment within 3-5 days of discharge Complete    HRI or [place] Complete    SW Recovery Care/Counseling Consult Complete    [place] Not Applicable

## 2022-09-04 NOTE — Progress Notes (Signed)
[facility]) Hospice hospital liaison note  [facility] liaison continues to follow peripherally for discharge disposition and will remain available to assist as needed.  Thank you for the opportunity to participate in this patient's care Jhonnie Garner, BSN, RN Hospice hospital liaison  [phone]

## 2022-09-04 NOTE — TOC Progression Note (Signed)
Transition of Care [facility]) - Progression Note    Patient Details  Name: Jordan [name] MRN: [id number] Date of Birth: 03/30/1923  Transition of Care [facility]) CM/SW Contact  Laurena Slimmer, RN Phone Number: 09/04/2022, 1:24 PM  Clinical Narrative:   Message sent to Jhonnie Garner from [facility] for update on hospice approval.    Expected Discharge Plan: [place] Barriers to Discharge: SNF Pending bed offer  Expected Discharge Plan and Services   Discharge Planning Services: CM Consult Post Acute Care Choice: [place] Living arrangements for the past 2 months: Single Family Home                 DME Arranged: [name] DME Agency: NA       HH Arranged: NA           Social Determinants of Health (SDOH) Interventions SDOH Screenings   Food Insecurity: No Food Insecurity (08/26/2022)  Housing: Low Risk  (08/26/2022)  Transportation Needs: No Transportation Needs (08/26/2022)  Utilities: Not At Risk (08/26/2022)  Tobacco Use: Medium Risk (08/26/2022)    Readmission Risk Interventions    07/18/2022    2:18 PM 07/14/2022   11:46 AM 07/11/2022   12:11 PM  Readmission Risk Prevention Plan  Transportation Screening  Complete Complete  PCP or Specialist Appt within 3-5 Days  Complete Complete  HRI or [place]  Complete Complete  Social Work Consult for [place] Planning/Counseling  Complete Complete  Palliative Care Screening  Not Applicable Not Applicable  Medication Review Press photographer)  Complete Complete  PCP or Specialist appointment within 3-5 days of discharge Complete    HRI or [place] Complete    SW Recovery Care/Counseling Consult Complete    [place] Not Applicable

## 2022-09-04 NOTE — Progress Notes (Signed)
Physical Therapy Treatment Patient Details Name: Jordan [name] MRN: [id number] DOB: 11-18-1922 Today's Date: 09/04/2022   History of Present Illness Recently in hospital 08/15/22-08/22/22 for femur fracture after fall at his facility [facility]). He underwent right intramedullary nail intertrochanteric on 08/16/2022. DIscharged to SNF Rehab at [facility].      Returned to ED early yesterday AM approx 03:00 for "seizure like activity" per staff - states the patient was witnessed flickering his eye lids for about 30 seconds and he had not been sleeping since he arrived at that [place] care facility two days prior. There was no witnessed tonic-clonic movement, frothing at the mouth, eyes rolling in the top of his head, tongue biting, urinary incontinence or postictal state.  Patient only complaint was some soreness from a fall he had two ago. Was not sent for evaluation following that fall. No LOC. Daughter expressed frustration and displeasure at patient's current SNF due to fall and she did not like the care that he has been receiving there. ED placed consult for social work for alternate SNF placement. Pt remains agitated.    PT Comments    Pt received in bed, calling out stating "We need to go".  Pt able to be redirected and assisted to EOB with MaxA. Pt required MaxA to squat pivot to bedside chair. Unable to attain upright standing due to dementia, however cooperative with transfer. Pt awake and alert throughout session. Currently awaiting placement.   Recommendations for follow up therapy are one component of a multi-disciplinary discharge planning process, led by the attending physician.  Recommendations may be updated based on patient status, additional functional criteria and insurance authorization.  Follow Up Recommendations  Skilled nursing-short term rehab (<3 hours/day) Can patient physically be transported by private vehicle: No   Assistance Recommended at  Discharge Frequent or constant Supervision/Assistance  Patient can return home with the following Two people to help with walking and/or transfers;Two people to help with bathing/dressing/bathroom;Assistance with cooking/housework;Assistance with feeding;Direct supervision/assist for medications management;Direct supervision/assist for financial management;Assist for transportation;Help with stairs or ramp for entrance   Equipment Recommendations  None recommended by PT    Recommendations for Other Services OT consult     Precautions / Restrictions Precautions Precautions: Fall Precaution Comments: Per chart, pt is legally blind, Recent R hip surgery Restrictions Weight Bearing Restrictions: Yes RLE Weight Bearing: Weight bearing as tolerated     Mobility  Bed Mobility Overal bed mobility: Needs Assistance Bed Mobility: Supine to Sit Rolling: Max assist         General bed mobility comments:  (When pt is less confused, he only requires MinA for supine>sit)    Transfers Overall transfer level: Needs assistance Equipment used: Rolling walker (2 wheels) Transfers: Sit to/from Stand Sit to Stand: Max assist   Step pivot transfers: Max assist       General transfer comment:  (Unable to attain upright standing)    Ambulation/Gait               General Gait Details: Unable to tolerate   Stairs             Wheelchair Mobility    Modified Rankin (Stroke Patients Only)       Balance Overall balance assessment: Needs assistance Sitting-balance support: Bilateral upper extremity supported, Feet supported   Sitting balance - Comments: able to sit unsupported with CGA Postural control: Posterior lean  Cognition Arousal/Alertness: Awake/alert Behavior During Therapy: WFL for tasks assessed/performed Overall Cognitive Status: Within Functional Limits for tasks assessed Area of Impairment: Orientation,  Attention, Memory, Following commands, Safety/judgement, Awareness, Problem solving                 Orientation Level: Disoriented to, Person, Place, Time, Situation Current Attention Level: Alternating Memory: Decreased recall of precautions, Decreased short-term memory Following Commands: Follows one step commands inconsistently Safety/Judgement: Decreased awareness of safety, Decreased awareness of deficits     General Comments: Confused, hallucinating,        Exercises      General Comments        Pertinent Vitals/Pain Pain Assessment Pain Assessment: No/denies pain    Home Living                          Prior Function            PT Goals (current goals can now be found in the care plan section) Acute Rehab PT Goals Patient Stated Goal: none stated    Frequency    7X/week      PT Plan Current plan remains appropriate    Co-evaluation              AM-PAC PT "6 Clicks" Mobility   Outcome Measure  Help needed turning from your back to your side while in a flat bed without using bedrails?: A Little Help needed moving from lying on your back to sitting on the side of a flat bed without using bedrails?: A Lot Help needed moving to and from a bed to a chair (including a wheelchair)?: A Lot Help needed standing up from a chair using your arms (e.g., wheelchair or bedside chair)?: A Lot Help needed to walk in hospital room?: A Lot Help needed climbing 3-5 steps with a railing? : Total 6 Click Score: 12    End of Session Equipment Utilized During Treatment: Gait belt Activity Tolerance: Patient tolerated treatment well Patient left: in chair;with call bell/phone within reach;with chair alarm set Nurse Communication: Mobility status PT Visit Diagnosis: Unsteadiness on feet (R26.81);Other abnormalities of gait and mobility (R26.89);Muscle weakness (generalized) (M62.81)     Time: 7408-1448 PT Time Calculation (min) (ACUTE ONLY): 16  min  Charges:  $Therapeutic Activity: 8-22 mins                    [name], PTA    Jordan [name] 09/04/2022, 1:31 PM

## 2022-09-05 DIAGNOSIS — A4150 Gram-negative sepsis, unspecified: Secondary | ICD-10-CM | POA: Diagnosis not present

## 2022-09-05 DIAGNOSIS — N390 Urinary tract infection, site not specified: Secondary | ICD-10-CM | POA: Diagnosis not present

## 2022-09-05 NOTE — Progress Notes (Signed)
Manufacturing systems engineer Liaison Note  Follow up visit on new hospice referral upon discharge from [facility].  Evaluated patient at bedside.  Patient was asleep and not easily aroused.  He appears in not acute distress upon evaluation.  Respirations even and unlabored, skin warm and dry to touch.  Spoke with patient's student nurse caring for him today.  She states he ate 75% of his breakfast/lunch today and drank a container of milk and juice.  Patient remains not appropriate for IPU.  Continue with plan for hospice at home or LTC facility.  TOC and hospital staff updated.  Will continue to follow through final disposition.    Thank you for allowing participation in this patient's care.  Dimas Aguas, RN Nurse Liaison [phone]

## 2022-09-05 NOTE — Progress Notes (Signed)
Progress Note   Patient: Jordan [name] OVF:[id number] DOB: 01/13/1923 DOA: 08/25/2022     10 DOS: the patient was seen and examined on 09/05/2022   Brief hospital course: Recently in hospital 08/15/22-08/22/22 for femur fracture after fall at his facility [facility]). He underwent right intramedullary nail intertrochanteric on 08/16/2022. DIscharged to SNF Rehab at [facility].    Returned to ED early AM approx 03:00 for "seizure like activity" per staff - states the patient was witnessed flickering his eye lids for about 30 seconds and he had not been sleeping since he arrived at that [place] care facility two days prior. There was no witnessed tonic-clonic movement, frothing at the mouth, eyes rolling in the top of his head, tongue biting, urinary incontinence or postictal state.  Patient only complaint was some soreness from a fall he had two ago. Was not sent for evaluation following that fall. No LOC. Daughter expressed frustration and displeasure at patient's current SNF due to fall and she did not like the care that he has been receiving there. ED placed consult for social work for alternate SNF placement.    As of early AM 08/26/22, patient had been boarding for the past 26+ hours in the ED pending TOC placement.  Staff alerted ED MD that patient had been sleeping all day and night, not gotten up and not eaten or drank anything, when they changed his brief, there was gross blood anteriorly over the diaper, around his urethral meatus.  ED MD ordered repeat labs and urinalysis which were distinctly different than the initial diagnostics done about 24 hours prior. Started ceftriaxone, consulted hospitalist for admission. HR and RR increased and UA concerning for UTI = sepsis.  02/10: Admitted to hospitalist service. Continuing abx pending cultures. 02/1: Per RN very agitated and combative overnight, requiring sitter. UCx 02/11 (+)GNR 02/12: remains confused, needs to be without  sitter or mittens 48h for SNF to accept back. Increased seroquel, trial d/c sitter today. UCx(+) Enterobacter aerogenes (S)ceftriaxone, continue IV abx while here  02/13: more alert today. Has not needed sitter or IV/IM meds since yesterday at noon. Placement still pending.  02/14: Pleasant during my evaluation.  Assisted with meals by an nurse tech.  Has not required a sitter. 2/15 patient seen in the morning sleeping at the time of my evaluation.  Minimal participation in PT.  Recommendation SNF with short-term rehab.  Pending placement. Patient seen and examined this morning.  Sleeping in bed.  Vital labs and imaging reviewed.  Daughter and granddaughter updated about the status.  Agreeing to palliative/hospice.  Patient seen this morning.  Sitting in the bed with eyes closed.  Opens eyes to verbal stimuli.  No overnight events.  Follow repeat urine.  Follow blood cultures to de-escalate antibiotics.  Patient pending palliative eval Patient seen and examined this morning.  No overnight event.  Daughter updated about the status last evening.  Concerned about decline by hospice.  Patient more alert and awake today tolerated diet.  Urine cultures pending.  Patient pending placement to SNF.  Antibiotics ceftriaxone can be discontinued tomorrow if cultures remain negative.  Patient has been very somnolent on Seroquel need to adjust the dose was held this morning.  Correct the evening dose to 50 mg.   02/19. Patient examined at the bedside.  More awake but confused  02/20: No events overnight.  Stable    Assessment and Plan:  Principal Problem:   Sepsis due to gram-negative UTI [facility]) Active Problems:  Femur fracture (HCC)   Dementia with behavioral disturbance (HCC)   Chronic diastolic CHF (congestive heart failure) (HCC)   Type 2 diabetes mellitus with complication (HCC)   Hypertension   Diverticulosis of colon without hemorrhage   Hyperlipidemia   Benign prostatic hyperplasia with urinary  obstruction   Malnutrition of moderate degree   Sepsis due to Enterobacter aerogenes ([place]) - sepsis parameters have resolved Treated with IV Rocephin and completed 9-day course of therapy     Femur fracture (HCC) s/p repair Pain control PT/OT   Chronic diastolic CHF (congestive heart failure) (HCC) Hypertension Hyperlipidemia CHF not in exacerbation Continue home ASA, statin, beta blocker   Type 2 diabetes mellitus with complication (HCC) Only on metformin at home A1C 7.2 2 mos ago - decent control, at goal for age  Hold metformin for now  Monitor Glc ac/hs x24 h Initiate SSI if needed   Benign prostatic hyperplasia with urinary obstruction Continue Myrbetriq, finasteride   Malnutrition of moderate degree Nutrition supplementation w/ Ensure    Dementia with behavioral disturbance (HCC) Agitation increased likely d/t sundowning and UTI Continue Seroquel at decreased dose due to sedation Continue Namenda Redirect as able/as needed  Has not required a sitter                   Physical Exam: Vitals:   09/04/22 1414 09/04/22 1941 09/05/22 0045 09/05/22 1300  BP: (!) 157/66  (!) 183/53 130/60  Pulse: 90  90 75  Resp: 16  16 16   Temp: 99 F (37.2 C)  98 F (36.7 C) 98.1 F (36.7 C)  TempSrc: Oral   Axillary  SpO2: 100%  91% 100%  Weight:  55 kg    Height:  5\' 9"  (1.753 m)     Vitals and nursing note reviewed.  Constitutional:      Comments: Confused  HENT:     Nose: Nose normal.     Mouth/Throat:     Mouth: Mucous membranes are moist.  Eyes:     Conjunctiva/sclera: Conjunctivae normal.  Cardiovascular:     Rate and Rhythm: Normal rate and regular rhythm.  Pulmonary:     Effort: Pulmonary effort is normal.     Breath sounds: Normal breath sounds.  Abdominal:     General: Abdomen is flat. Bowel sounds are normal.     Palpations: Abdomen is soft.  Musculoskeletal:     Cervical back: Normal range of motion and neck supple.     Comments:  Decreased range of motion right hip  Skin:    General: Skin is warm and dry.  Neurological:     Mental Status: He is alert.     Comments: Unable to assess  Psychiatric:     Comments: Unable to assess   Data Reviewed: There are no new results to review at this time.  Family Communication: Case manager has been in communication with patient's daughter about discharge plans.  Please see progress note  Disposition: Status is: Inpatient Remains inpatient appropriate because: Needs long-term SNF placement  Planned Discharge Destination: Skilled nursing facility    Time spent: 25 minutes  Author: Collier Bullock, MD 09/05/2022 3:23 PM  For on call review [email or web address].

## 2022-09-05 NOTE — Progress Notes (Signed)
Patient has been resisting mobility, fighting attempts in repositioning.  Patient attempted multiple times to bite staff, patient bit his own right hand, small abrasion noted, cleaned. MD made aware.

## 2022-09-05 NOTE — Progress Notes (Signed)
PT Cancellation Note  Patient Details Name: Jordan [name] MRN: [id number] DOB: 03-Dec-1922   Cancelled Treatment:     Several attempts throughout the day, however pt very lethargic, not responding to vc's. Attempted LE ROM, pt resistant. Will continue as pt able to tolerate and participate.   [name] 09/05/2022, 4:18 PM

## 2022-09-05 NOTE — TOC Progression Note (Addendum)
Transition of Care [facility]) - Progression Note    Patient Details  Name: Jordan [name] MRN: [id number] Date of Birth: [birth year]  Transition of Care [facility]) CM/SW [place], RN Phone Number: 09/05/2022, 10:41 AM  Clinical Narrative:    Reached out to Jordan [name] the patient's daughter.  I explained to her that he does not qualify for Hospice facility, I explained that he will not get coverage for STR due tpo being confused and not able to follow directions to participate effectively with PT She stated that last night the patient was very confused, and he was talking about people that are deceased I explained to go to [place] care He has to have LTC insurances or Long term medicaid, or pay out of pocket, She stated that he used to have [employer] but does not any longer,  She stated that she is fearful of him going to any facility The daughter stated that she is not able to get him to eat or drink anything he loves tea but will not even drink tea,  She stated that she feels that he is laying in the bed starving I explained that patient's go thru this stage often times where they get to where they do not want to eat and drink,  She does not understand why he does not qualify for Hospice facility, I explained they require a 2 week or less Prognosis She is not able to care for him at home due to the level of care he requires and his confusion She is also there to help her daughter that has cancer and she has seen that he needs 2 Plus assistance and does not feel she can provide that She has paid out of pocket to go to [facility] [facility] in the past and has paid thru Feb.  I encouraged her not to pay for March as he does not meet ALF level at this time I explained that he does not qualify for ALF facility level of Memory Care,  as they require them to be independent with mobility, She is willing for him to go to [facility] long term care and pay out of pocket if he does not qualify  for Hospice facility, She would like me to reach out to [facility] to see if they have a long term bed I reached out to [place] at [facility] to see if they have a long term bed, I left a secure VM asking for a call back, I also sent thru the [place],  I called [facility] to speak to Jordan [name], Jordan [name] reports that they do have a long term bed available, they are contracted with [facility], they would love to have the patient come to them for long term care private pay and be followed by Hospice   I called Jordan [name] the patient's daughter and let her know that they do have a long term bed at [facility] I explained that Hospice stated that he still does not qualify for Hospice facility and it was reported by the Nursing Student that he ate 75% of his meals  I explained that we have found a bed for long term, I explained that he is medically ready, She stated that she will not be ready to discharge him tomorrow, she  has her daughter having a biopsy tomorrow, she stated that she is not ready and will not be ready tomorrow, I explained Once a patient is medically ready and we have a bed  offer then they will not longer be covered in the hospital.  She was upset and said she has been told different things almost everyday, I explained that the plans have changed due to his condition.  At first we were looking at [facility], then due to confusion and  being lethargic hospice evaluated with her permission, I explained that Hospice stated that he does not qualify for Hospice facility but they would follow outpatient, I explained that Long term care was recommended with Hospice to follow.  I explained things change all the time and that he has been in the hospital 11 days, he is medically clear to go to the next level of care which is LTC followed by hospice, She stated just send him today, I explained that she would need to call [facility] to accept the bed and to fill out the paperwork and that they may allow her to do  the paperwork over the phone that some places do allow it, She said "[facility]" and hung up the phone  [facility] called and stated they do not have a bed available   Expected Discharge Plan: [place] Barriers to Discharge: SNF Pending bed offer  Expected Discharge Plan and Services   Discharge Planning Services: CM Consult Post Acute Care Choice: [place] Living arrangements for the past 2 months: Single Family Home                 DME Arranged: [name] DME Agency: NA       HH Arranged: NA           Social Determinants of Health (SDOH) Interventions SDOH Screenings   Food Insecurity: No Food Insecurity (08/26/2022)  Housing: Low Risk  (08/26/2022)  Transportation Needs: No Transportation Needs (08/26/2022)  Utilities: Not At Risk (08/26/2022)  Tobacco Use: Medium Risk (08/26/2022)    Readmission Risk Interventions    07/18/2022    2:18 PM 07/14/2022   11:46 AM 07/11/2022   12:11 PM  Readmission Risk Prevention Plan  Transportation Screening  Complete Complete  PCP or Specialist Appt within 3-5 Days  Complete Complete  HRI or Home Care Consult  Complete Complete  Social Work Consult for [place] Planning/Counseling  Complete Complete  Palliative Care Screening  Not Applicable Not Applicable  Medication Review Press photographer)  Complete Complete  PCP or Specialist appointment within 3-5 days of discharge Complete    HRI or [place] Complete    SW Recovery Care/Counseling Consult Complete    [place] Not Applicable

## 2022-09-06 DIAGNOSIS — N390 Urinary tract infection, site not specified: Secondary | ICD-10-CM | POA: Diagnosis not present

## 2022-09-06 DIAGNOSIS — A4150 Gram-negative sepsis, unspecified: Secondary | ICD-10-CM | POA: Diagnosis not present

## 2022-09-06 MED ORDER — ORAL CARE MOUTH RINSE: 15.0000 mL | OROMUCOSAL | Status: DC | PRN

## 2022-09-06 MED ORDER — ZIPRASIDONE MESYLATE 20 MG IM SOLR
10.0000 mg | Freq: Once | INTRAMUSCULAR | Status: AC
  Administered 2022-09-07: 10 mg via INTRAMUSCULAR
  Filled 2022-09-06: qty 20

## 2022-09-06 MED ORDER — SODIUM CHLORIDE 0.9 % IV SOLN: 1.0000 g | INTRAVENOUS | Status: DC

## 2022-09-06 NOTE — Progress Notes (Signed)
Physical Therapy Treatment Patient Details Name: Jordan [name] MRN: [id number] DOB: 1922-12-13 Today's Date: 09/06/2022   History of Present Illness Recently in hospital 08/15/22-08/22/22 for femur fracture after fall at his facility [name]). He underwent right intramedullary nail intertrochanteric on 08/16/2022. DIscharged to SNF Rehab at [name].      Returned to ED early yesterday AM approx 03:00 for "seizure like activity" per staff - states the patient was witnessed flickering his eye lids for about 30 seconds and he had not been sleeping since he arrived at that [name] care facility two days prior. There was no witnessed tonic-clonic movement, frothing at the mouth, eyes rolling in the top of his head, tongue biting, urinary incontinence or postictal state.  Patient only complaint was some soreness from a fall he had two ago. Was not sent for evaluation following that fall. No LOC. Daughter expressed frustration and displeasure at patient's current SNF due to fall and she did not like the care that he has been receiving there. ED placed consult for social work for alternate SNF placement. Pt remains agitated.    PT Comments    Pt has been struggling this week with altered mental status. He fluctuates between extreme lethargy to agitation. He has not tolerated ROM to LE's due to resisting movement and has not been a candidate for OOB to chair due to confusion, hallucinations, and overall decline. Pt seen briefly for attempted ROM and repositioning to facilitate skin integrity and upright posture. Will reassess next visit for possible decrease in PT frequency.   Recommendations for follow up therapy are one component of a multi-disciplinary discharge planning process, led by the attending physician.  Recommendations may be updated based on patient status, additional functional criteria and insurance authorization.  Follow Up Recommendations  Skilled nursing-short  term rehab (<3 hours/day) Can patient physically be transported by private vehicle: No   Assistance Recommended at Discharge Frequent or constant Supervision/Assistance  Patient can return home with the following Two people to help with walking and/or transfers;Two people to help with bathing/dressing/bathroom;Assistance with cooking/housework;Assistance with feeding;Direct supervision/assist for medications management;Direct supervision/assist for financial management;Assist for transportation;Help with stairs or ramp for entrance   Equipment Recommendations  None recommended by PT    Recommendations for Other Services       Precautions / Restrictions Precautions Precautions: Fall Precaution Comments: Per chart, pt is legally blind, Recent R hip surgery Restrictions Weight Bearing Restrictions: Yes RLE Weight Bearing: Weight bearing as tolerated     Mobility  Bed Mobility Overal bed mobility: Needs Assistance Bed Mobility: Supine to Sit Rolling: Max assist   Supine to sit: Max assist Sit to supine: Max assist   General bed mobility comments: Occasionally pt will sit himself at EOB to urinate, therefore has the strength and ability but due to confusion, he is unable at this time    Transfers                   General transfer comment: Pt unable to awake enough for transfers    Ambulation/Gait               General Gait Details: Unable to tolerate   Stairs             Wheelchair Mobility    Modified Rankin (Stroke Patients Only)       Balance  Cognition Arousal/Alertness: Lethargic   Overall Cognitive Status: History of cognitive impairments - at baseline Area of Impairment: Orientation, Attention, Memory, Following commands, Safety/judgement, Awareness, Problem solving                 Orientation Level: Disoriented to, Person, Place, Time, Situation Current Attention  Level: Alternating Memory: Decreased recall of precautions, Decreased short-term memory Following Commands:  (Not following commands at this time) Safety/Judgement: Decreased awareness of safety, Decreased awareness of deficits     General Comments: Confused, hallucinating, unable to understand simple commands        Exercises      General Comments General comments (skin integrity, edema, etc.): Pt was agitated earlier, now very lethargic, not responding to vc's or ROM attempts. Pt repositioned in bed in upright posture with pillows to prevent skin breakdown      Pertinent Vitals/Pain      Home Living                          Prior Function            PT Goals (current goals can now be found in the care plan section) Acute Rehab PT Goals Patient Stated Goal: none stated    Frequency    7X/week      PT Plan Current plan remains appropriate    Co-evaluation              AM-PAC PT "6 Clicks" Mobility   Outcome Measure  Help needed turning from your back to your side while in a flat bed without using bedrails?: A Little Help needed moving from lying on your back to sitting on the side of a flat bed without using bedrails?: A Lot Help needed moving to and from a bed to a chair (including a wheelchair)?: A Lot Help needed standing up from a chair using your arms (e.g., wheelchair or bedside chair)?: A Lot Help needed to walk in hospital room?: A Lot Help needed climbing 3-5 steps with a railing? : Total 6 Click Score: 12    End of Session         PT Visit Diagnosis: Unsteadiness on feet (R26.81);Other abnormalities of gait and mobility (R26.89);Muscle weakness (generalized) (M62.81)     Time: 8264-1583 PT Time Calculation (min) (ACUTE ONLY): 10 min  Charges:  $Therapeutic Activity: 8-22 mins                    Jordan [name], PTA    Jordan [name] 09/06/2022, 12:26 PM

## 2022-09-06 NOTE — Progress Notes (Signed)
PROGRESS NOTE    Jordan [name]  ION:[id number] DOB: July 09, 1923 DOA: 08/25/2022 PCP: Asencion Noble, MD    Brief Narrative:  Recently in hospital 08/15/22-08/22/22 for femur fracture after fall at his facility [facility]). He underwent right intramedullary nail intertrochanteric on 08/16/2022. DIscharged to SNF Rehab at [facility]    Returned to ED early AM approx 03:00 for "seizure like activity" per staff - states the patient was witnessed flickering his eye lids for about 30 seconds and he had not been sleeping since he arrived at that [place] care facility two days prior. There was no witnessed tonic-clonic movement, frothing at the mouth, eyes rolling in the top of his head, tongue biting, urinary incontinence or postictal state.  Patient only complaint was some soreness from a fall he had two ago. Was not sent for evaluation following that fall. No LOC. Daughter expressed frustration and displeasure at patient's current SNF due to fall and she did not like the care that he has been receiving there. ED placed consult for social work for alternate SNF placement.    As of early AM 08/26/22, patient had been boarding for the past 26+ hours in the ED pending TOC placement.  Staff alerted ED MD that patient had been sleeping all day and night, not gotten up and not eaten or drank anything, when they changed his brief, there was gross blood anteriorly over the diaper, around his urethral meatus.  ED MD ordered repeat labs and urinalysis which were distinctly different than the initial diagnostics done about 24 hours prior. Started ceftriaxone, consulted hospitalist for admission. HR and RR increased and UA concerning for UTI = sepsis.  02/10: Admitted to hospitalist service. Continuing abx pending cultures. 02/1: Per RN very agitated and combative overnight, requiring sitter. UCx 02/11 (+)GNR 02/12: remains confused, needs to be without sitter or mittens 48h for SNF to accept back.  Increased seroquel, trial d/c sitter today. UCx(+) Enterobacter aerogenes (S)ceftriaxone, continue IV abx while here  02/13: more alert today. Has not needed sitter or IV/IM meds since yesterday at noon. Placement still pending.  02/14: Pleasant during my evaluation.  Assisted with meals by an nurse tech.  Has not required a sitter. 2/15 patient seen in the morning sleeping at the time of my evaluation.  Minimal participation in PT.  Recommendation SNF with short-term rehab.  Pending placement. Patient seen and examined this morning.  Sleeping in bed.  Vital labs and imaging reviewed.  Daughter and granddaughter updated about the status.  Agreeing to palliative/hospice.  Patient seen this morning.  Sitting in the bed with eyes closed.  Opens eyes to verbal stimuli.  No overnight events.  Follow repeat urine.  Follow blood cultures to de-escalate antibiotics.  Patient pending palliative eval Patient seen and examined this morning.  No overnight event.  Daughter updated about the status last evening.  Concerned about decline by hospice.  Patient more alert and awake today tolerated diet.  Urine cultures pending.  Patient pending placement to SNF.  Antibiotics ceftriaxone can be discontinued tomorrow if cultures remain negative.  Patient has been very somnolent on Seroquel need to adjust the dose was held this morning.  Correct the evening dose to 50 mg.   02/19. Patient examined at the bedside.  More awake but confused   02/20: No events overnight.  Stable 2/21: Some confusion noted by bedside RN this morning.  Patient calm in my evaluation.  Verbally unresponsive.  Being fed by patient care tech.  Assessment & Plan:   Principal Problem:   Sepsis due to gram-negative UTI [facility]) Active Problems:   Femur fracture (HCC)   Dementia with behavioral disturbance (HCC)   Chronic diastolic CHF (congestive heart failure) (HCC)   Type 2 diabetes mellitus with complication (HCC)   Hypertension    Diverticulosis of colon without hemorrhage   Hyperlipidemia   Benign prostatic hyperplasia with urinary obstruction   Malnutrition of moderate degree   Sepsis secondary to UTI ([place])  Sepsis due to Enterobacter aerogenes ([place]) - sepsis parameters have resolved Treated with IV Rocephin and completed 9-day course of therapy Medically stable for discharge     Femur fracture [facility]) s/p repair Pain control PT/OT Medically appropriate for discharge to long-term care versus SNF   Chronic diastolic CHF (congestive heart failure) ([place]) Hypertension Hyperlipidemia CHF not in exacerbation Continue home ASA, statin, beta blocker   Type 2 diabetes mellitus with complication ([place]) Only on metformin at home A1C 7.2 2 mos ago - decent control, at goal for age  Hold metformin for now  Monitor Glc ac/hs x24 h Initiate SSI if needed   Benign prostatic hyperplasia with urinary obstruction Continue Myrbetriq, finasteride   Malnutrition of moderate degree Nutrition supplementation w/ Ensure    Dementia with behavioral disturbance (HCC) Agitation increased likely d/t sundowning and UTI Continue Seroquel at decreased dose due to sedation Continue Namenda Redirect as able/as needed  Has not required a sitter Does require 2 person assist and seating Is appropriate for disposition to long-term care with hospice backup  DVT prophylaxis: SQ Lovenox Code Status: DNR Family Communication: None today Disposition Plan: Status is: Inpatient Remains inpatient appropriate because: Unsafe discharge plan.  Plan for disposition to long-term care with hospice services.  Anticipated date of discharge 2/22   Level of care: Med-Surg  Consultants:  None  Procedures:  None  Antimicrobials: None   Subjective: Seen and examined.  Sitting up in bed.  Does not respond to questions.  Getting fed by patient care tech  Objective: Vitals:   09/04/22 1941 09/05/22 0045 09/05/22 1300 09/05/22 2346  BP:   (!) 183/53 130/60 (!) 117/52  Pulse:  90 75 75  Resp:  16 16 16   Temp:  98 F (36.7 C) 98.1 F (36.7 C) 98.7 F (37.1 C)  TempSrc:   Axillary   SpO2:  91% 100% 100%  Weight: 55 kg     Height: 5\' 9"  (1.753 m)       Intake/Output Summary (Last 24 hours) at 09/06/2022 1301 Last data filed at 09/06/2022 1216 Gross per 24 hour  Intake 580 ml  Output 1100 ml  Net -520 ml   Filed Weights   09/04/22 1941  Weight: 55 kg    Examination:  General exam: No acute distress.  Frail-appearing Respiratory system: Bibasilar crackles.  Normal work of breathing.  Room air Cardiovascular system: S1-S2, RRR, no murmurs, no pedal edema Gastrointestinal system: Soft, NT/ND, normal bowel sounds Central nervous system: Alert.  Unable to assess orientation. Extremities: Unable to assess Skin: Thin and pale with no obvious rashes or lesions Psychiatry: unable to assess    Data Reviewed: I have personally reviewed following labs and imaging studies  CBC: Recent Labs  Lab 08/31/22 0520 09/03/22 0437  WBC 5.1 7.3  HGB 8.1* 7.9*  HCT 25.5* 24.8*  MCV 89.5 90.5  PLT 445* 277*   Basic Metabolic Panel: Recent Labs  Lab 08/31/22 0520 09/02/22 0459 09/03/22 0437  NA 138  --  140  K 3.6  --  3.4*  CL 104  --  106  CO2 27  --  28  GLUCOSE 108*  --  112*  BUN 25*  --  40*  CREATININE 0.92 1.09 0.88  CALCIUM 7.9*  --  7.6*   GFR: Estimated Creatinine Clearance: 36.5 mL/min (by C-G formula based on SCr of 0.88 mg/dL). Liver Function Tests: No results for input(s): "AST", "ALT", "ALKPHOS", "BILITOT", "PROT", "ALBUMIN" in the last 168 hours. No results for input(s): "LIPASE", "AMYLASE" in the last 168 hours. No results for input(s): "AMMONIA" in the last 168 hours. Coagulation Profile: No results for input(s): "INR", "PROTIME" in the last 168 hours. Cardiac Enzymes: No results for input(s): "CKTOTAL", "CKMB", "CKMBINDEX", "TROPONINI" in the last 168 hours. BNP (last 3 results) No  results for input(s): "PROBNP" in the last 8760 hours. HbA1C: No results for input(s): "HGBA1C" in the last 72 hours. CBG: No results for input(s): "GLUCAP" in the last 168 hours. Lipid Profile: No results for input(s): "CHOL", "HDL", "LDLCALC", "TRIG", "CHOLHDL", "LDLDIRECT" in the last 72 hours. Thyroid Function Tests: No results for input(s): "TSH", "T4TOTAL", "FREET4", "T3FREE", "THYROIDAB" in the last 72 hours. Anemia Panel: No results for input(s): "VITAMINB12", "FOLATE", "FERRITIN", "TIBC", "IRON", "RETICCTPCT" in the last 72 hours. Sepsis Labs: No results for input(s): "PROCALCITON", "LATICACIDVEN" in the last 168 hours.  Recent Results (from the past 240 hour(s))  Culture, blood (Routine X 2) w Reflex to ID Panel     Status: None (Preliminary result)   Collection Time: 09/02/22 10:39 AM   Specimen: BLOOD  Result Value Ref Range Status   Specimen Description BLOOD BLOOD RIGHT HAND  Final   Special Requests   Final    BOTTLES DRAWN AEROBIC AND ANAEROBIC Blood Culture results may not be optimal due to an excessive volume of blood received in culture bottles   Culture   Final    NO GROWTH 3 DAYS Performed at [facility], [address] [place], [place] [address]    Report Status PENDING  Incomplete  Culture, blood (Routine X 2) w Reflex to ID Panel     Status: None (Preliminary result)   Collection Time: 09/02/22 10:39 AM   Specimen: BLOOD  Result Value Ref Range Status   Specimen Description BLOOD BLOOD LEFT HAND  Final   Special Requests   Final    BOTTLES DRAWN AEROBIC AND ANAEROBIC Blood Culture adequate volume   Culture   Final    NO GROWTH 3 DAYS Performed at [facility], [address] [place],  [address]    Report Status PENDING  Incomplete         Radiology Studies: No results found.      Scheduled Meds:  aspirin  81 mg Oral BID   budesonide  3 mg Oral Daily   docusate  100 mg Oral Daily   enoxaparin (LOVENOX)  injection  40 mg Subcutaneous Q24H   feeding supplement  237 mL Oral TID PC   memantine  10 mg Oral BID   multivitamin  1 tablet Oral Daily   oxybutynin  5 mg Oral BID   pravastatin  80 mg Oral q1800   QUEtiapine  50 mg Oral BID WC   QUEtiapine  50 mg Oral QHS   Continuous Infusions:   LOS: 11 days      Sidney Ace, MD [facility]   If 7PM-7AM, please contact night-coverage  09/06/2022, 1:01 PM

## 2022-09-06 NOTE — TOC Progression Note (Signed)
Transition of Care [facility]) - Progression Note    Patient Details  Name: Jordan [name] MRN: [id number] Date of Birth: 06-02-1923  Transition of Care [facility]) CM/SW [place], RN Phone Number: 09/06/2022, 9:48 AM  Clinical Narrative:   The patient's daughter is not available today as her daughter is having a lung biopsy today, She will need to go to [facility] to complete paperwork to make payment for LTC placement, [facility] to follow    Expected Discharge Plan: Skilled Nursing Facility Barriers to Discharge: SNF Pending bed offer  Expected Discharge Plan and Services   Discharge Planning Services: CM Consult Post Acute Care Choice: [place] Living arrangements for the past 2 months: Single Family Home                 DME Arranged: [name] DME Agency: NA       HH Arranged: NA           Social Determinants of Health (SDOH) Interventions SDOH Screenings   Food Insecurity: No Food Insecurity (08/26/2022)  Housing: Low Risk  (08/26/2022)  Transportation Needs: No Transportation Needs (08/26/2022)  Utilities: Not At Risk (08/26/2022)  Tobacco Use: Medium Risk (08/26/2022)    Readmission Risk Interventions    07/18/2022    2:18 PM 07/14/2022   11:46 AM 07/11/2022   12:11 PM  Readmission Risk Prevention Plan  Transportation Screening  Complete Complete  PCP or Specialist Appt within 3-5 Days  Complete Complete  HRI or Home Care Consult  Complete Complete  Social Work Consult for [place] Planning/Counseling  Complete Complete  Palliative Care Screening  Not Applicable Not Applicable  Medication Review Press photographer)  Complete Complete  PCP or Specialist appointment within 3-5 days of discharge Complete    HRI or [place] Complete    SW Recovery Care/Counseling Consult Complete    [place] Not Applicable

## 2022-09-06 NOTE — Progress Notes (Signed)
Called to room, patient severely agitated, trying to get out of bed constantly, after being assisted to stand up at side of bed.  Patient unable to follow safety reminders, unable to understand instructions.  Patient tries to fight and push staff.  Dr. Priscella Mann made aware.

## 2022-09-06 NOTE — Progress Notes (Signed)
Patient highly confused, trying to get out of the bed, combative.  Assisted back to bed with 2-person assist.

## 2022-09-07 DIAGNOSIS — N390 Urinary tract infection, site not specified: Secondary | ICD-10-CM | POA: Diagnosis not present

## 2022-09-07 DIAGNOSIS — A4150 Gram-negative sepsis, unspecified: Secondary | ICD-10-CM | POA: Diagnosis not present

## 2022-09-07 LAB — BASIC METABOLIC PANEL
Anion gap: 3 — ABNORMAL LOW (ref 5–15)
BUN: 25 mg/dL — ABNORMAL HIGH (ref 8–23)
CO2: 30 mmol/L (ref 22–32)
Calcium: 8.2 mg/dL — ABNORMAL LOW (ref 8.9–10.3)
Chloride: 104 mmol/L (ref 98–111)
Creatinine, Ser: 0.87 mg/dL (ref 0.61–1.24)
GFR, Estimated: >60 mL/min (ref >60)
Glucose, Bld: 110 mg/dL — ABNORMAL HIGH (ref 70–99)
Potassium: 4.3 mmol/L (ref 3.5–5.1)
Sodium: 137 mmol/L (ref 135–145)

## 2022-09-07 LAB — CBC WITH DIFFERENTIAL/PLATELET
Abs Immature Granulocytes: 0.02 10*3/uL (ref 0.00–0.07)
Basophils Absolute: 0.1 10*3/uL (ref 0.0–0.1)
Basophils Relative: 1 %
Eosinophils Absolute: 0.2 10*3/uL (ref 0.0–0.5)
Eosinophils Relative: 4 %
HCT: 29.6 % — ABNORMAL LOW (ref 39.0–52.0)
Hemoglobin: 9.2 g/dL — ABNORMAL LOW (ref 13.0–17.0)
Immature Granulocytes: 0 %
Lymphocytes Relative: 21 %
Lymphs Abs: 1.1 10*3/uL (ref 0.7–4.0)
MCH: 28 pg (ref 26.0–34.0)
MCHC: 31.1 g/dL (ref 30.0–36.0)
MCV: 90 fL (ref 80.0–100.0)
Monocytes Absolute: 0.5 10*3/uL (ref 0.1–1.0)
Monocytes Relative: 9 %
Neutro Abs: 3.3 10*3/uL (ref 1.7–7.7)
Neutrophils Relative %: 65 %
Platelets: 497 10*3/uL — ABNORMAL HIGH (ref 150–400)
RBC: 3.29 MIL/uL — ABNORMAL LOW (ref 4.22–5.81)
RDW: 14.1 % (ref 11.5–15.5)
WBC: 5.1 10*3/uL (ref 4.0–10.5)
nRBC: 0 % (ref 0.0–0.2)

## 2022-09-07 LAB — CULTURE, BLOOD (ROUTINE X 2)
Culture: NO GROWTH
Culture: NO GROWTH
Special Requests: ADEQUATE

## 2022-09-07 LAB — MAGNESIUM: Magnesium: 2.3 mg/dL (ref 1.7–2.4)

## 2022-09-07 MED ORDER — QUETIAPINE FUMARATE 25 MG PO TABS: 75.0000 mg | ORAL_TABLET | Freq: Every day | ORAL | 0 refills | Status: AC

## 2022-09-07 MED ORDER — SENNA 8.6 MG PO TABS: 1.0000 | ORAL_TABLET | Freq: Every day | ORAL | 0 refills | Status: DC | PRN

## 2022-09-07 MED ORDER — DOCUSATE SODIUM 50 MG/5ML PO LIQD: 100.0000 mg | Freq: Every day | ORAL | 0 refills | Status: DC

## 2022-09-07 MED ORDER — BISACODYL 10 MG RE SUPP: 10.0000 mg | Freq: Every day | RECTAL | 0 refills | Status: DC | PRN

## 2022-09-07 MED ORDER — OXYBUTYNIN CHLORIDE 5 MG PO TABS: 5.0000 mg | ORAL_TABLET | Freq: Two times a day (BID) | ORAL | Status: DC

## 2022-09-07 NOTE — Consult Note (Signed)
   [facility] Inpatient Consult   09/07/2022  Jordan [name] 11-04-1922 [id number]  [place] [facility] [ACO] Patient: [name] Medicare  Primary Care Provider:  Asencion Noble, MD  *[facility] Liaison remote coverage review for patient admitted to [facility]  Patient reviewed for disposition as less than 7 days readmission with extreme high risk score  Patient is currently active with [facility] [facility] for chronic disease management services.  Patient has been engaged by a [place].  Our community based plan of care has focused on disease management and community resource support.    Plan: Current plan is patient is transitioning to a SNF level of care currently for possible Long term care noted in inpatient TOC progress notes. Will update [facility] RNCM of planned transition.   Of note, [facility] Care Management services does not replace or interfere with any services that are needed or arranged by inpatient [facility] care management team.   For additional questions or referrals please contact:  Natividad Brood, RN BSN [place]  [phone] business mobile phone Toll free office [phone]  *[place]  [phone] Fax number: [phone] [email or web address]

## 2022-09-07 NOTE — Progress Notes (Signed)
PT Cancellation Note  Patient Details Name: Jordan [name] MRN: [id number] DOB: 16-Feb-1923   Cancelled Treatment:    Reason Eval/Treat Not Completed: Patient declined, no reason specified Will re-attempt later today.  Cameran Ahmed 09/07/2022, 9:11 AM

## 2022-09-07 NOTE — Progress Notes (Addendum)
PT Cancellation Note  Patient Details Name: Jordan [name] [place] MRN: [id number] DOB: [birth year]   Cancelled Treatment:    Reason Eval/Treat Not Completed: Other (comment) (patient unable due to cognition) Patient not able to tolerate PT at this time. Will -rea-attempt, frequency being reduced to 2x per week.    Kamyrah Feeser 09/07/2022, 11:42 AM

## 2022-09-07 NOTE — Plan of Care (Signed)
  Problem: Clinical Measurements: Goal: Ability to maintain clinical measurements within normal limits will improve Outcome: Progressing Goal: Will remain free from infection Outcome: Progressing Goal: Diagnostic test results will improve Outcome: Progressing Goal: Respiratory complications will improve Outcome: Progressing Goal: Cardiovascular complication will be avoided Outcome: Progressing   Problem: Activity: Goal: Risk for activity intolerance will decrease Outcome: Progressing   Problem: Nutrition: Goal: Adequate nutrition will be maintained Outcome: Progressing   Problem: Coping: Goal: Level of anxiety will decrease Outcome: Progressing   Problem: Elimination: Goal: Will not experience complications related to bowel motility Outcome: Progressing Goal: Will not experience complications related to urinary retention Outcome: Progressing   Problem: Pain Managment: Goal: General experience of comfort will improve Outcome: Progressing   Problem: Safety: Goal: Ability to remain free from injury will improve Outcome: Progressing   Problem: Skin Integrity: Goal: Risk for impaired skin integrity will decrease Outcome: Progressing   Problem: Education: Goal: Knowledge of General Education information will improve Description: Including pain rating scale, medication(s)/side effects and non-pharmacologic comfort measures Outcome: Not Progressing Note: Patient experiencing confusion at this time. Will continue to educate and reorient as needed.   Problem: Health Behavior/Discharge Planning: Goal: Ability to manage health-related needs will improve Outcome: Not Progressing Note: Patient experiencing confusion at this time. Will continue to educate and reorient as needed.

## 2022-09-07 NOTE — TOC Progression Note (Signed)
Transition of Care [facility]) - Progression Note    Patient Details  Name: Bea[place] Austad Sally MRN: [id number] Date of Birth: Mar 07, 1923  Transition of Care [facility]) CM/SW [place], RN Phone Number: 09/07/2022, 12:10 PM  Clinical Narrative:    I spoke with the patient's daughter [name] she is trying to reach [place] at [facility] to arrange filling out the [facility] so he can DC   Expected Discharge Plan: [place] Barriers to Discharge: SNF Pending bed offer  Expected Discharge Plan and Services   Discharge Planning Services: CM Consult Post Acute Care Choice: [place] Living arrangements for the past 2 months: Single Family Home                 DME Arranged: [name] DME Agency: NA       HH Arranged: NA           Social Determinants of Health (SDOH) Interventions SDOH Screenings   Food Insecurity: No Food Insecurity (08/26/2022)  Housing: Low Risk  (08/26/2022)  Transportation Needs: No Transportation Needs (08/26/2022)  Utilities: Not At Risk (08/26/2022)  Tobacco Use: Medium Risk (08/26/2022)    Readmission Risk Interventions    07/18/2022    2:18 PM 07/14/2022   11:46 AM 07/11/2022   12:11 PM  Readmission Risk Prevention Plan  Transportation Screening  Complete Complete  PCP or Specialist Appt within 3-5 Days  Complete Complete  HRI or [place]  Complete Complete  Social Work Consult for [place] Planning/Counseling  Complete Complete  Palliative Care Screening  Not Applicable Not Applicable  Medication Review Press photographer)  Complete Complete  PCP or Specialist appointment within 3-5 days of discharge Complete    HRI or [place] Complete    SW Recovery Care/Counseling Consult Complete    [place] Not Applicable

## 2022-09-07 NOTE — Plan of Care (Signed)
  Problem: Clinical Measurements: Goal: Will remain free from infection Outcome: Progressing   Problem: Clinical Measurements: Goal: Ability to maintain clinical measurements within normal limits will improve Outcome: Progressing   Problem: Clinical Measurements: Goal: Respiratory complications will improve Outcome: Progressing   Problem: Clinical Measurements: Goal: Cardiovascular complication will be avoided Outcome: Progressing   Problem: Elimination: Goal: Will not experience complications related to urinary retention Outcome: Progressing

## 2022-09-07 NOTE — TOC Progression Note (Signed)
Transition of Care [facility]) - Progression Note    Patient Details  Name: Sonnie Pawloski Seidl MRN: [id number] Date of Birth: Oct 20, 1922  Transition of Care [facility]) CM/SW [place], RN Phone Number: 09/07/2022, 10:03 AM  Clinical Narrative:    Called the patient's daughter [name] left a Vm asking for a call back   Expected Discharge Plan: [place] Barriers to Discharge: SNF Pending bed offer  Expected Discharge Plan and Services   Discharge Planning Services: CM Consult Post Acute Care Choice: [place] Living arrangements for the past 2 months: Single Family Home                 DME Arranged: [name] DME Agency: NA       HH Arranged: NA           Social Determinants of Health (SDOH) Interventions SDOH Screenings   Food Insecurity: No Food Insecurity (08/26/2022)  Housing: Low Risk  (08/26/2022)  Transportation Needs: No Transportation Needs (08/26/2022)  Utilities: Not At Risk (08/26/2022)  Tobacco Use: Medium Risk (08/26/2022)    Readmission Risk Interventions    07/18/2022    2:18 PM 07/14/2022   11:46 AM 07/11/2022   12:11 PM  Readmission Risk Prevention Plan  Transportation Screening  Complete Complete  PCP or Specialist Appt within 3-5 Days  Complete Complete  HRI or [place]  Complete Complete  Social Work Consult for [place] Planning/Counseling  Complete Complete  Palliative Care Screening  Not Applicable Not Applicable  Medication Review Press photographer)  Complete Complete  PCP or Specialist appointment within 3-5 days of discharge Complete    HRI or [place] Complete    SW Recovery Care/Counseling Consult Complete    [place] Not Applicable

## 2022-09-07 NOTE — Care Management Important Message (Signed)
Important Message  Patient Details  Name: Jordan [name]: [id number] Date of Birth: 01-Jul-1923   Medicare Important Message Given:  Yes  Late entry: IM given.   [name] 09/07/2022, 2:24 PM

## 2022-09-07 NOTE — Discharge Summary (Signed)
Physician Discharge Summary  Angelica Wix Loyola ZTI:[id number] DOB: 05-07-1923 DOA: 08/25/2022  PCP: Asencion Noble, MD  Admit date: 08/25/2022 Discharge date: 09/07/2022  Admitted From: Home Disposition:  Long term care with hospice  Recommendations for Outpatient Follow-up:  Per outpatient hospice providers   Home Health: No Equipment/Devices: None  Discharge Condition: Stable CODE STATUS: DNR Diet recommendation: Comfort  Brief/Interim Summary: Recently in hospital 08/15/22-08/22/22 for femur fracture after fall at his facility [facility]). He underwent right intramedullary nail intertrochanteric on 08/16/2022. DIscharged to SNF Rehab at [facility].    Returned to ED early AM approx 03:00 for "seizure like activity" per staff - states the patient was witnessed flickering his eye lids for about 30 seconds and he had not been sleeping since he arrived at that [place] care facility two days prior. There was no witnessed tonic-clonic movement, frothing at the mouth, eyes rolling in the top of his head, tongue biting, urinary incontinence or postictal state.  Patient only complaint was some soreness from a fall he had two ago. Was not sent for evaluation following that fall. No LOC. Daughter expressed frustration and displeasure at patient's current SNF due to fall and she did not like the care that he has been receiving there. ED placed consult for social work for alternate SNF placement.    As of early AM 08/26/22, patient had been boarding for the past 26+ hours in the ED pending TOC placement.  Staff alerted ED MD that patient had been sleeping all day and night, not gotten up and not eaten or drank anything, when they changed his brief, there was gross blood anteriorly over the diaper, around his urethral meatus.  ED MD ordered repeat labs and urinalysis which were distinctly different than the initial diagnostics done about 24 hours prior. Started ceftriaxone, consulted  hospitalist for admission. HR and RR increased and UA concerning for UTI = sepsis.  02/10: Admitted to hospitalist service. Continuing abx pending cultures. 02/1: Per RN very agitated and combative overnight, requiring sitter. UCx 02/11 (+)GNR 02/12: remains confused, needs to be without sitter or mittens 48h for SNF to accept back. Increased seroquel, trial d/c sitter today. UCx(+) Enterobacter aerogenes (S)ceftriaxone, continue IV abx while here  02/13: more alert today. Has not needed sitter or IV/IM meds since yesterday at noon. Placement still pending.  02/14: Pleasant during my evaluation.  Assisted with meals by an nurse tech.  Has not required a sitter. 2/15 patient seen in the morning sleeping at the time of my evaluation.  Minimal participation in PT.  Recommendation SNF with short-term rehab.  Pending placement. Patient seen and examined this morning.  Sleeping in bed.  Vital labs and imaging reviewed.  Daughter and granddaughter updated about the status.  Agreeing to palliative/hospice.  Patient seen this morning.  Sitting in the bed with eyes closed.  Opens eyes to verbal stimuli.  No overnight events.  Follow repeat urine.  Follow blood cultures to de-escalate antibiotics.  Patient pending palliative eval Patient seen and examined this morning.  No overnight event.  Daughter updated about the status last evening.  Concerned about decline by hospice.  Patient more alert and awake today tolerated diet.  Urine cultures pending.  Patient pending placement to SNF.  Antibiotics ceftriaxone can be discontinued tomorrow if cultures remain negative.  Patient has been very somnolent on Seroquel need to adjust the dose was held this morning.  Correct the evening dose to 50 mg.   02/19. Patient examined at  the bedside.  More awake but confused   02/20: No events overnight.  Stable 2/21: Some confusion noted by bedside RN this morning.  Patient calm in my evaluation.  Verbally unresponsive.  Being fed  by patient care tech. 2/22: Patient appropriate for disposition to long-term care facility with hospice.  Intermittent agitation otherwise hemodynamically stable and appropriate for discharge    Discharge Diagnoses:  Principal Problem:   Sepsis due to gram-negative UTI [facility]) Active Problems:   Femur fracture (HCC)   Dementia with behavioral disturbance (HCC)   Chronic diastolic CHF (congestive heart failure) (HCC)   Type 2 diabetes mellitus with complication (HCC)   Hypertension   Diverticulosis of colon without hemorrhage   Hyperlipidemia   Benign prostatic hyperplasia with urinary obstruction   Malnutrition of moderate degree   Sepsis secondary to UTI ([place]) Sepsis due to Enterobacter aerogenes ([place]) - sepsis parameters have resolved Treated with IV Rocephin and completed 9-day course of therapy Medically stable for discharge     Femur fracture [facility]) s/p repair Pain control PT/OT Medically appropriate for discharge to long-term care versus SNF   Chronic diastolic CHF (congestive heart failure) ([place]) Hypertension Hyperlipidemia CHF not in exacerbation Continue home ASA, statin, beta blocker   Type 2 diabetes mellitus with complication ([place]) Only on metformin at home A1C 7.2 2 mos ago - decent control, at goal for age     Benign prostatic hyperplasia with urinary obstruction Continue Myrbetriq, finasteride   Malnutrition of moderate degree Nutrition supplementation w/ Ensure    Dementia with behavioral disturbance (HCC) Agitation increased likely d/t sundowning and UTI Continue Seroquel at decreased dose due to sedation Continue Namenda Redirect as able/as needed  Has not required a sitter Does require 2 person assist and seating Is appropriate for disposition to long-term care with hospice backup  Patient is of advanced age with significant debility suspected from dementia.  Mental status and p.o. intake is poor.  Patient is appropriate for DNR status.  If the  patient were to decompensate and/or further deteriorate while at long-term care suggest reevaluation by hospice for admission to inpatient hospice unit.   Discharge Instructions  Discharge Instructions     Diet - low sodium heart healthy   Complete by: As directed    Increase activity slowly   Complete by: As directed       Allergies as of 09/07/2022   No Known Allergies      Medication List     STOP taking these medications    atorvastatin 20 MG tablet Commonly known as: LIPITOR   metFORMIN 500 MG tablet Commonly known as: GLUCOPHAGE   metoprolol tartrate 25 MG tablet Commonly known as: LOPRESSOR   mirabegron ER 25 MG Tb24 tablet Commonly known as: MYRBETRIQ   pravastatin 80 MG tablet Commonly known as: PRAVACHOL       TAKE these medications    acetaminophen 325 MG tablet Commonly known as: TYLENOL Take 650 mg by mouth 3 (three) times daily.   aspirin 81 MG chewable tablet Chew 1 tablet (81 mg total) by mouth 2 (two) times daily. (0800)   bisacodyl 10 MG suppository Commonly known as: DULCOLAX Place 1 suppository (10 mg total) rectally daily as needed for moderate constipation.   budesonide 3 MG 24 hr capsule Commonly known as: ENTOCORT EC TAKE 1 CAPSULE BY MOUTH DAILY. What changed:  when to take this additional instructions   docusate 50 MG/5ML liquid Commonly known as: COLACE Take 10 mLs (100 mg total) by  mouth daily.   feeding supplement Liqd Take 237 mLs by mouth 3 (three) times daily after meals.   finasteride 5 MG tablet Commonly known as: PROSCAR TAKE 1 TABLET BY MOUTH DAILY AS DIRECTED. What changed: additional instructions   melatonin 5 MG Tabs Take 5 mg by mouth at bedtime as needed (sleep).   memantine 10 MG tablet Commonly known as: NAMENDA Take 10 mg by mouth 2 (two) times daily. (0800, 2000)   oxybutynin 5 MG tablet Commonly known as: DITROPAN Take 1 tablet (5 mg total) by mouth 2 (two) times daily.   PreserVision  AREDS Tabs Take 1 tablet by mouth in the morning. (0800)   QUEtiapine 25 MG tablet Commonly known as: SEROQUEL Take 3 tablets (75 mg total) by mouth at bedtime. What changed:  how much to take additional instructions   senna 8.6 MG Tabs tablet Commonly known as: SENOKOT Take 1 tablet (8.6 mg total) by mouth daily as needed for mild constipation. What changed:  when to take this reasons to take this        No Known Allergies  Consultations: Ortho   Procedures/Studies: DG Pelvis 1-2 Views  Result Date: 08/25/2022 CLINICAL DATA:  89 year old male with recent fall and seizure like activity. Status post right hip ORIF on 08/16/2022. EXAM: PELVIS - 1-2 VIEW COMPARISON:  Intraoperative right hip ORIF images 13124. Pelvis radiograph 07/08/2022. FINDINGS: AP view at 0343 hours. Recent proximal right femur ORIF with overlying skin staples still in place. Visible hardware appears intact. Near anatomic alignment, aside from proximally displaced lesser trochanter comminution fragment. Stable bony pelvis compared to December. Left femoral head remains located. Extensive iliofemoral calcified atherosclerosis. Negative visible bowel gas. IMPRESSION: 1. Recent right femur ORIF, incompletely visible but no adverse features identified. 2. No superimposed acute pelvis fracture identified. 3. Extensive iliofemoral calcified atherosclerosis. Electronically Signed   By: Genevie Ann M.D.   On: 08/25/2022 04:07   DG Chest 1 View  Result Date: 08/25/2022 CLINICAL DATA:  89 year old male with recent fall and seizure like activity. EXAM: CHEST  1 VIEW COMPARISON:  Portable chest 08/15/2022 and earlier. FINDINGS: Portable AP supine view at 0344 hours. Stable to mildly improved lung volumes. Calcified aortic atherosclerosis. Other mediastinal contours are within normal limits. Visualized tracheal air column is within normal limits. Allowing for portable technique the lungs are clear. No pneumothorax or pleural  effusion. No acute osseous abnormality identified. Negative visible bowel gas. IMPRESSION: 1. No acute cardiopulmonary abnormality or acute traumatic injury identified. 2. Aortic Atherosclerosis (ICD10-I70.0). Electronically Signed   By: Genevie Ann M.D.   On: 08/25/2022 04:05   CT Head Wo Contrast  Result Date: 08/25/2022 CLINICAL DATA:  Head trauma, minor (Age >= 65y) fall 2 days ago, head contusion EXAM: CT HEAD WITHOUT CONTRAST TECHNIQUE: Contiguous axial images were obtained from the base of the skull through the vertex without intravenous contrast. RADIATION DOSE REDUCTION: This exam was performed according to the departmental dose-optimization program which includes automated exposure control, adjustment of the mA and/or kV according to patient size and/or use of iterative reconstruction technique. COMPARISON:  08/11/2022 FINDINGS: Brain: Diffuse cerebral atrophy. Old right basal ganglia lacunar infarct. No acute intracranial abnormality. Specifically, no hemorrhage, hydrocephalus, mass lesion, acute infarction, or significant intracranial injury. Vascular: No hyperdense vessel or unexpected calcification. Skull: No acute calvarial abnormality. Sinuses/Orbits: No acute findings Other: None IMPRESSION: No acute intracranial abnormality. Electronically Signed   By: Rolm Baptise M.D.   On: 08/25/2022 03:52   DG C-Arm  1-60 Min-No Report  Result Date: 08/16/2022 CLINICAL DATA:  ORIF right hip EXAM: DG HIP (WITH OR WITHOUT PELVIS) 2-3V RIGHT; DG C-ARM 1-60 MIN-NO REPORT COMPARISON:  Right hip radiographs dated 08/15/2022 Fluoroscopy time: 39 seconds FINDINGS: Intraoperative fluoroscopic images during IM nail with dynamic hip screw fixation of an intertrochanteric right hip fracture. Fracture fragments are in near anatomic alignment and position, noting a minimally displaced lesser trochanteric fragment. IMPRESSION: Intraoperative fluoroscopic images during ORIF of an intertrochanteric right hip fracture, as  above. Electronically Signed   By: Julian Hy M.D.   On: 08/16/2022 21:48   DG HIP UNILAT WITH PELVIS 2-3 VIEWS RIGHT  Result Date: 08/16/2022 CLINICAL DATA:  ORIF right hip EXAM: DG HIP (WITH OR WITHOUT PELVIS) 2-3V RIGHT; DG C-ARM 1-60 MIN-NO REPORT COMPARISON:  Right hip radiographs dated 08/15/2022 Fluoroscopy time: 39 seconds FINDINGS: Intraoperative fluoroscopic images during IM nail with dynamic hip screw fixation of an intertrochanteric right hip fracture. Fracture fragments are in near anatomic alignment and position, noting a minimally displaced lesser trochanteric fragment. IMPRESSION: Intraoperative fluoroscopic images during ORIF of an intertrochanteric right hip fracture, as above. Electronically Signed   By: Julian Hy M.D.   On: 08/16/2022 21:48   DG Chest 1 View  Result Date: 08/15/2022 CLINICAL DATA:  Fall. EXAM: CHEST  1 VIEW COMPARISON:  07/08/2022 FINDINGS: Low lung volumes.The cardiomediastinal contours are normal. The lungs are clear. Pulmonary vasculature is normal. No consolidation, pleural effusion, or pneumothorax. No acute osseous abnormalities are seen. IMPRESSION: Low lung volumes without acute abnormality. Electronically Signed   By: Keith Rake M.D.   On: 08/15/2022 20:46   DG Hip Unilat W or Wo Pelvis 2-3 Views Right  Result Date: 08/15/2022 CLINICAL DATA:  Pain after fall EXAM: DG HIP (WITH OR WITHOUT PELVIS) 2 V RIGHT COMPARISON:  None Available. FINDINGS: Mildly displaced intertrochanteric hip fracture on the right. Comminuted. No additional fracture or dislocation. Preserved joint spaces. Only mild degenerative changes of the sacroiliac joints. Hyperostosis. Osteopenia. Scattered vascular calcifications. Degenerative changes seen of the lower lumbar spine at the edge of the imaging field. IMPRESSION: Mildly displaced intertrochanteric comminuted right hip fracture Electronically Signed   By: Jill Side M.D.   On: 08/15/2022 20:45   CT Cervical  Spine Wo Contrast  Result Date: 08/11/2022 CLINICAL DATA:  Fall.  Hit head. EXAM: CT CERVICAL SPINE WITHOUT CONTRAST TECHNIQUE: Multidetector CT imaging of the cervical spine was performed without intravenous contrast. Multiplanar CT image reconstructions were also generated. RADIATION DOSE REDUCTION: This exam was performed according to the departmental dose-optimization program which includes automated exposure control, adjustment of the mA and/or kV according to patient size and/or use of iterative reconstruction technique. COMPARISON:  CT cervical spine 07/21/2022 FINDINGS: Alignment: There is 2 mm grade 1 anterolisthesis of C2 on C3, 4 mm grade 1 anterolisthesis of C4 on C5, and 2 mm grade 1 anterolisthesis of C7 on T1, not significantly changed. Mild levocurvature centered at C5-6. Skull base and vertebrae: The atlantodens interval is intact. The facet joints are appropriately aligned. Vertebral body heights are maintained. Moderate to severe C4-5 and C5-6 moderate C6-7, and mild right C7-T1 disc space narrowing. Large anterior bridging osteophytes from C4-5 through T1-2. No acute fracture is seen. Soft tissues and spinal canal: No prevertebral fluid or swelling. No visible canal hematoma. Disc levels: Multilevel disc space narrowing, uncovertebral hypertrophy, and facet joint hypertrophy contribute to moderate to severe right and moderate left C2-3, severe right and moderate left C3-4, severe right  and moderate left C4-5, moderate to severe left-greater-than-right C5-6, severe left and moderate right C6-7, and mild right C7-T1 neuroforaminal narrowing. Mild-to-moderate C5-6 central canal stenosis. Upper chest: There is scarring is seen within the bilateral posterior lung apices, partially calcified. Other: No cervical chain lymphadenopathy. IMPRESSION: 1. No acute fracture. 2. Multilevel degenerative disc and joint changes as above. Electronically Signed   By: Yvonne Kendall M.D.   On: 08/11/2022 16:34    CT HEAD WO CONTRAST (5MM)  Result Date: 08/11/2022 CLINICAL DATA:  Fall. Hit head. No loss of consciousness. Not on blood thinners. EXAM: CT HEAD WITHOUT CONTRAST TECHNIQUE: Contiguous axial images were obtained from the base of the skull through the vertex without intravenous contrast. RADIATION DOSE REDUCTION: This exam was performed according to the departmental dose-optimization program which includes automated exposure control, adjustment of the mA and/or kV according to patient size and/or use of iterative reconstruction technique. COMPARISON:  CT brain 07/21/2022 FINDINGS: Brain: There is moderate cortical atrophy, unchanged from prior within normal limits for patient age. The ventricles are normal in configuration. The basilar cisterns are patent. No mass, mass effect, or midline shift. No acute intracranial hemorrhage is seen. No abnormal extra-axial fluid collection. Mild-to-moderate patchy periventricular white matter hypodensities, likely chronic ischemic white matter changes, not significantly changed. Preservation of the normal cortical gray-white interface without CT evidence of an acute major vascular territorial cortical based infarction. Vascular: There are atherosclerotic calcifications within the bilateral carotid siphons. Skull: Normal. Negative for fracture or focal lesion. Sinuses/Orbits: Postsurgical changes of bilateral ocular lens replacements are again noted. Mild posterior left ethmoid air cell mucosal opacification is unchanged. Mild-to-moderate left sphenoid sinus mucosal opacification and bubbly lucencies are similar to prior. The visualized mastoid air cells are clear. Other: None. IMPRESSION: 1. No acute intracranial hemorrhage or calvarial fracture. 2. Stable mild-to-moderate periventricular white matter hypodensities, likely chronic ischemic white matter changes. Electronically Signed   By: Yvonne Kendall M.D.   On: 08/11/2022 16:23      Subjective: Seen and examined on  the day of discharge.  Appears at baseline level of mentation  Discharge Exam: Vitals:   09/07/22 0042 09/07/22 0758  BP:  (!) 130/102  Pulse:  79  Resp:  20  Temp:  97.8 F (36.6 C)  SpO2: 100% 100%   Vitals:   09/06/22 1227 09/07/22 0041 09/07/22 0042 09/07/22 0758  BP: (!) 178/54 132/60  (!) 130/102  Pulse: 74 81  79  Resp: 18 16  20   Temp:  98.8 F (37.1 C)  97.8 F (36.6 C)  TempSrc:  Axillary  Oral  SpO2: 94% 91% 100% 100%  Weight:      Height:          The results of significant diagnostics from this hospitalization (including imaging, microbiology, ancillary and laboratory) are listed below for reference.     Microbiology: Recent Results (from the past 240 hour(s))  Culture, blood (Routine X 2) w Reflex to ID Panel     Status: None   Collection Time: 09/02/22 10:39 AM   Specimen: BLOOD  Result Value Ref Range Status   Specimen Description BLOOD BLOOD RIGHT HAND  Final   Special Requests   Final    BOTTLES DRAWN AEROBIC AND ANAEROBIC Blood Culture results may not be optimal due to an excessive volume of blood received in culture bottles   Culture   Final    NO GROWTH 5 DAYS Performed at [facility], [address] [place], [place]  [address]    Report Status 09/07/2022 FINAL  Final  Culture, blood (Routine X 2) w Reflex to ID Panel     Status: None   Collection Time: 09/02/22 10:39 AM   Specimen: BLOOD  Result Value Ref Range Status   Specimen Description BLOOD BLOOD LEFT HAND  Final   Special Requests   Final    BOTTLES DRAWN AEROBIC AND ANAEROBIC Blood Culture adequate volume   Culture   Final    NO GROWTH 5 DAYS Performed at [facility], [address] [place], [place] [address]    Report Status 09/07/2022 FINAL  Final     Labs: BNP (last 3 results) No results for input(s): "BNP" in the last 8760 hours. Basic Metabolic Panel: Recent Labs  Lab 09/02/22 0459 09/03/22 0437 09/07/22 0314  NA  --  140 137  K  --  3.4*  4.3  CL  --  106 104  CO2  --  28 30  GLUCOSE  --  112* 110*  BUN  --  40* 25*  CREATININE 1.09 0.88 0.87  CALCIUM  --  7.6* 8.2*  MG  --   --  2.3   Liver Function Tests: No results for input(s): "AST", "ALT", "ALKPHOS", "BILITOT", "PROT", "ALBUMIN" in the last 168 hours. No results for input(s): "LIPASE", "AMYLASE" in the last 168 hours. No results for input(s): "AMMONIA" in the last 168 hours. CBC: Recent Labs  Lab 09/03/22 0437 09/07/22 0314  WBC 7.3 5.1  NEUTROABS  --  3.3  HGB 7.9* 9.2*  HCT 24.8* 29.6*  MCV 90.5 90.0  PLT 465* 497*   Cardiac Enzymes: No results for input(s): "CKTOTAL", "CKMB", "CKMBINDEX", "TROPONINI" in the last 168 hours. BNP: Invalid input(s): "POCBNP" CBG: No results for input(s): "GLUCAP" in the last 168 hours. D-Dimer No results for input(s): "DDIMER" in the last 72 hours. Hgb A1c No results for input(s): "HGBA1C" in the last 72 hours. Lipid Profile No results for input(s): "CHOL", "HDL", "LDLCALC", "TRIG", "CHOLHDL", "LDLDIRECT" in the last 72 hours. Thyroid function studies No results for input(s): "TSH", "T4TOTAL", "T3FREE", "THYROIDAB" in the last 72 hours.  Invalid input(s): "FREET3" Anemia work up No results for input(s): "VITAMINB12", "FOLATE", "FERRITIN", "TIBC", "IRON", "RETICCTPCT" in the last 72 hours. Urinalysis    Component Value Date/Time   COLORURINE YELLOW (A) 09/03/2022 0710   APPEARANCEUR HAZY (A) 09/03/2022 0710   APPEARANCEUR Clear 03/17/2022 1240   LABSPEC 1.024 09/03/2022 0710   PHURINE 5.0 09/03/2022 0710   GLUCOSEU NEGATIVE 09/03/2022 0710   HGBUR NEGATIVE 09/03/2022 0710   BILIRUBINUR NEGATIVE 09/03/2022 0710   BILIRUBINUR negative 04/13/2022 1525   BILIRUBINUR Negative 03/17/2022 1240   KETONESUR 5 (A) 09/03/2022 0710   PROTEINUR 30 (A) 09/03/2022 0710   UROBILINOGEN 1.0 04/13/2022 1525   NITRITE NEGATIVE 09/03/2022 0710   LEUKOCYTESUR MODERATE (A) 09/03/2022 0710   Sepsis Labs Recent Labs  Lab  09/03/22 0437 09/07/22 0314  WBC 7.3 5.1   Microbiology Recent Results (from the past 240 hour(s))  Culture, blood (Routine X 2) w Reflex to ID Panel     Status: None   Collection Time: 09/02/22 10:39 AM   Specimen: BLOOD  Result Value Ref Range Status   Specimen Description BLOOD BLOOD RIGHT HAND  Final   Special Requests   Final    BOTTLES DRAWN AEROBIC AND ANAEROBIC Blood Culture results may not be optimal due to an excessive volume of blood received in culture bottles   Culture  Final    NO GROWTH 5 DAYS Performed at [facility], [address] [place], [place] [address]    Report Status 09/07/2022 FINAL  Final  Culture, blood (Routine X 2) w Reflex to ID Panel     Status: None   Collection Time: 09/02/22 10:39 AM   Specimen: BLOOD  Result Value Ref Range Status   Specimen Description BLOOD BLOOD LEFT HAND  Final   Special Requests   Final    BOTTLES DRAWN AEROBIC AND ANAEROBIC Blood Culture adequate volume   Culture   Final    NO GROWTH 5 DAYS Performed at [facility], [address] [place], [place] [address]    Report Status 09/07/2022 FINAL  Final     Time coordinating discharge: Over 30 minutes  SIGNED:   Sidney Ace, MD  [facility] 09/07/2022, 12:17 PM Pager   If 7PM-7AM, please contact night-coverage

## 2022-09-07 NOTE — TOC Progression Note (Signed)
Transition of Care [facility]) - Progression Note    Patient Details  Name: Jordan [name] MRN: [id number] Date of Birth: 1922/07/31  Transition of Care [facility]) CM/SW [place], RN Phone Number: 09/07/2022, 1:20 PM  Clinical Narrative:     The patient to dc to [facility] today after 2 PM, going to room A11-1 daughter to go fill out paper work EMS to transport and will be here after 2 PM  Expected Discharge Plan: [place] Barriers to Discharge: SNF Pending bed offer  Expected Discharge Plan and Services   Discharge Planning Services: CM Consult Post Acute Care Choice: [place] Living arrangements for the past 2 months: Single Family Home Expected Discharge Date: 08/28/22               DME Arranged: [name] DME Agency: NA       HH Arranged: NA           Social Determinants of Health (SDOH) Interventions SDOH Screenings   Food Insecurity: No Food Insecurity (08/26/2022)  Housing: Low Risk  (08/26/2022)  Transportation Needs: No Transportation Needs (08/26/2022)  Utilities: Not At Risk (08/26/2022)  Tobacco Use: Medium Risk (08/26/2022)    Readmission Risk Interventions    07/18/2022    2:18 PM 07/14/2022   11:46 AM 07/11/2022   12:11 PM  Readmission Risk Prevention Plan  Transportation Screening  Complete Complete  PCP or Specialist Appt within 3-5 Days  Complete Complete  HRI or [place]  Complete Complete  Social Work Consult for [place] Planning/Counseling  Complete Complete  Palliative Care Screening  Not Applicable Not Applicable  Medication Review Press photographer)  Complete Complete  PCP or Specialist appointment within 3-5 days of discharge Complete    HRI or [place] Complete    SW Recovery Care/Counseling Consult Complete    [place] Not Applicable

## 2022-09-08 DIAGNOSIS — N3946 Mixed incontinence: Secondary | ICD-10-CM | POA: Diagnosis not present

## 2022-09-08 DIAGNOSIS — M6281 Muscle weakness (generalized): Secondary | ICD-10-CM | POA: Diagnosis not present

## 2022-09-08 DIAGNOSIS — R239 Unspecified skin changes: Secondary | ICD-10-CM | POA: Diagnosis not present

## 2022-09-08 DIAGNOSIS — L89153 Pressure ulcer of sacral region, stage 3: Secondary | ICD-10-CM | POA: Diagnosis not present

## 2022-09-11 ENCOUNTER — Ambulatory Visit (INDEPENDENT_AMBULATORY_CARE_PROVIDER_SITE_OTHER): Payer: Medicare Other | Admitting: Gastroenterology

## 2022-09-13 DIAGNOSIS — M6281 Muscle weakness (generalized): Secondary | ICD-10-CM | POA: Diagnosis not present

## 2022-09-13 DIAGNOSIS — R239 Unspecified skin changes: Secondary | ICD-10-CM | POA: Diagnosis not present

## 2022-09-13 DIAGNOSIS — L89153 Pressure ulcer of sacral region, stage 3: Secondary | ICD-10-CM | POA: Diagnosis not present

## 2022-09-13 DIAGNOSIS — N3946 Mixed incontinence: Secondary | ICD-10-CM | POA: Diagnosis not present

## 2022-09-14 ENCOUNTER — Encounter: Payer: Self-pay | Admitting: Radiology

## 2022-09-14 DIAGNOSIS — A419 Sepsis, unspecified organism: Secondary | ICD-10-CM | POA: Diagnosis not present

## 2022-09-14 DIAGNOSIS — N390 Urinary tract infection, site not specified: Secondary | ICD-10-CM | POA: Diagnosis not present

## 2022-09-14 DIAGNOSIS — R296 Repeated falls: Secondary | ICD-10-CM | POA: Diagnosis not present

## 2022-09-14 DIAGNOSIS — S7291XD Unspecified fracture of right femur, subsequent encounter for closed fracture with routine healing: Secondary | ICD-10-CM | POA: Diagnosis not present

## 2022-09-15 DIAGNOSIS — R451 Restlessness and agitation: Secondary | ICD-10-CM | POA: Diagnosis not present

## 2022-09-18 DIAGNOSIS — N400 Enlarged prostate without lower urinary tract symptoms: Secondary | ICD-10-CM | POA: Diagnosis not present

## 2022-09-18 DIAGNOSIS — A419 Sepsis, unspecified organism: Secondary | ICD-10-CM | POA: Diagnosis not present

## 2022-09-18 DIAGNOSIS — N3281 Overactive bladder: Secondary | ICD-10-CM | POA: Diagnosis not present

## 2022-09-18 DIAGNOSIS — S7291XD Unspecified fracture of right femur, subsequent encounter for closed fracture with routine healing: Secondary | ICD-10-CM | POA: Diagnosis not present

## 2022-09-20 DIAGNOSIS — R239 Unspecified skin changes: Secondary | ICD-10-CM | POA: Diagnosis not present

## 2022-09-20 DIAGNOSIS — N3946 Mixed incontinence: Secondary | ICD-10-CM | POA: Diagnosis not present

## 2022-09-20 DIAGNOSIS — M6281 Muscle weakness (generalized): Secondary | ICD-10-CM | POA: Diagnosis not present

## 2022-09-20 DIAGNOSIS — L89153 Pressure ulcer of sacral region, stage 3: Secondary | ICD-10-CM | POA: Diagnosis not present

## 2022-09-25 DIAGNOSIS — R451 Restlessness and agitation: Secondary | ICD-10-CM | POA: Diagnosis not present

## 2022-09-27 DIAGNOSIS — L89153 Pressure ulcer of sacral region, stage 3: Secondary | ICD-10-CM | POA: Diagnosis not present

## 2022-09-27 DIAGNOSIS — N3946 Mixed incontinence: Secondary | ICD-10-CM | POA: Diagnosis not present

## 2022-09-27 DIAGNOSIS — R239 Unspecified skin changes: Secondary | ICD-10-CM | POA: Diagnosis not present

## 2022-09-27 DIAGNOSIS — M6281 Muscle weakness (generalized): Secondary | ICD-10-CM | POA: Diagnosis not present

## 2022-09-29 DIAGNOSIS — E860 Dehydration: Secondary | ICD-10-CM

## 2022-10-06 DIAGNOSIS — M6281 Muscle weakness (generalized): Secondary | ICD-10-CM | POA: Diagnosis not present

## 2022-10-06 DIAGNOSIS — N400 Enlarged prostate without lower urinary tract symptoms: Secondary | ICD-10-CM | POA: Diagnosis not present

## 2022-10-06 DIAGNOSIS — R239 Unspecified skin changes: Secondary | ICD-10-CM | POA: Diagnosis not present

## 2022-10-06 DIAGNOSIS — N3946 Mixed incontinence: Secondary | ICD-10-CM | POA: Diagnosis not present

## 2022-10-06 DIAGNOSIS — K5900 Constipation, unspecified: Secondary | ICD-10-CM | POA: Diagnosis not present

## 2022-10-06 DIAGNOSIS — F03918 Unspecified dementia, unspecified severity, with other behavioral disturbance: Secondary | ICD-10-CM | POA: Diagnosis not present

## 2022-10-06 DIAGNOSIS — L89153 Pressure ulcer of sacral region, stage 3: Secondary | ICD-10-CM | POA: Diagnosis not present

## 2022-10-09 ENCOUNTER — Ambulatory Visit: Payer: Medicare Other | Admitting: Urology

## 2022-10-11 DIAGNOSIS — M6281 Muscle weakness (generalized): Secondary | ICD-10-CM | POA: Diagnosis not present

## 2022-10-11 DIAGNOSIS — R239 Unspecified skin changes: Secondary | ICD-10-CM | POA: Diagnosis not present

## 2022-10-11 DIAGNOSIS — L89153 Pressure ulcer of sacral region, stage 3: Secondary | ICD-10-CM | POA: Diagnosis not present

## 2022-10-11 DIAGNOSIS — N3946 Mixed incontinence: Secondary | ICD-10-CM | POA: Diagnosis not present

## 2022-10-11 DIAGNOSIS — R451 Restlessness and agitation: Secondary | ICD-10-CM | POA: Diagnosis not present

## 2022-10-12 DIAGNOSIS — R451 Restlessness and agitation: Secondary | ICD-10-CM | POA: Diagnosis not present

## 2022-10-12 DIAGNOSIS — I2510 Atherosclerotic heart disease of native coronary artery without angina pectoris: Secondary | ICD-10-CM | POA: Diagnosis not present

## 2022-10-12 DIAGNOSIS — F03918 Unspecified dementia, unspecified severity, with other behavioral disturbance: Secondary | ICD-10-CM | POA: Diagnosis not present

## 2022-10-12 DIAGNOSIS — N400 Enlarged prostate without lower urinary tract symptoms: Secondary | ICD-10-CM | POA: Diagnosis not present

## 2022-10-16 DIAGNOSIS — R7309 Other abnormal glucose: Secondary | ICD-10-CM | POA: Diagnosis not present

## 2022-10-18 DIAGNOSIS — R239 Unspecified skin changes: Secondary | ICD-10-CM | POA: Diagnosis not present

## 2022-10-18 DIAGNOSIS — N3946 Mixed incontinence: Secondary | ICD-10-CM | POA: Diagnosis not present

## 2022-10-18 DIAGNOSIS — M6281 Muscle weakness (generalized): Secondary | ICD-10-CM | POA: Diagnosis not present

## 2022-10-18 DIAGNOSIS — L89153 Pressure ulcer of sacral region, stage 3: Secondary | ICD-10-CM | POA: Diagnosis not present

## 2022-10-25 DIAGNOSIS — N3946 Mixed incontinence: Secondary | ICD-10-CM | POA: Diagnosis not present

## 2022-10-25 DIAGNOSIS — R239 Unspecified skin changes: Secondary | ICD-10-CM | POA: Diagnosis not present

## 2022-10-25 DIAGNOSIS — E1169 Type 2 diabetes mellitus with other specified complication: Secondary | ICD-10-CM | POA: Diagnosis not present

## 2022-10-25 DIAGNOSIS — M6281 Muscle weakness (generalized): Secondary | ICD-10-CM | POA: Diagnosis not present

## 2022-10-28 ENCOUNTER — Other Ambulatory Visit: Payer: Self-pay

## 2022-10-28 ENCOUNTER — Emergency Department (HOSPITAL_COMMUNITY)

## 2022-10-28 ENCOUNTER — Emergency Department (HOSPITAL_COMMUNITY)
Admission: EM | Admit: 2022-10-28 | Discharge: 2022-10-28 | Disposition: A | Discharge status: Skilled Nursing Facility | Attending: Emergency Medicine | Admitting: Emergency Medicine

## 2022-10-28 DIAGNOSIS — S0003XA Contusion of scalp, initial encounter: Secondary | ICD-10-CM

## 2022-10-28 DIAGNOSIS — R8290 Unspecified abnormal findings in urine: Secondary | ICD-10-CM | POA: Insufficient documentation

## 2022-10-28 DIAGNOSIS — I10 Essential (primary) hypertension: Secondary | ICD-10-CM | POA: Insufficient documentation

## 2022-10-28 DIAGNOSIS — F0390 Unspecified dementia without behavioral disturbance: Secondary | ICD-10-CM | POA: Insufficient documentation

## 2022-10-28 DIAGNOSIS — E119 Type 2 diabetes mellitus without complications: Secondary | ICD-10-CM | POA: Insufficient documentation

## 2022-10-28 DIAGNOSIS — Z043 Encounter for examination and observation following other accident: Secondary | ICD-10-CM | POA: Diagnosis not present

## 2022-10-28 DIAGNOSIS — M25519 Pain in unspecified shoulder: Secondary | ICD-10-CM | POA: Diagnosis not present

## 2022-10-28 DIAGNOSIS — M4312 Spondylolisthesis, cervical region: Secondary | ICD-10-CM | POA: Diagnosis not present

## 2022-10-28 DIAGNOSIS — I2510 Atherosclerotic heart disease of native coronary artery without angina pectoris: Secondary | ICD-10-CM | POA: Diagnosis not present

## 2022-10-28 DIAGNOSIS — S0083XA Contusion of other part of head, initial encounter: Secondary | ICD-10-CM | POA: Insufficient documentation

## 2022-10-28 DIAGNOSIS — W19XXXA Unspecified fall, initial encounter: Secondary | ICD-10-CM | POA: Diagnosis not present

## 2022-10-28 DIAGNOSIS — S41112A Laceration without foreign body of left upper arm, initial encounter: Secondary | ICD-10-CM | POA: Diagnosis not present

## 2022-10-28 LAB — URINALYSIS, W/ REFLEX TO CULTURE (INFECTION SUSPECTED)
Bilirubin Urine: NEGATIVE
Glucose, UA: NEGATIVE mg/dL
Hgb urine dipstick: NEGATIVE
Ketones, ur: NEGATIVE mg/dL
Nitrite: NEGATIVE
Protein, ur: NEGATIVE mg/dL
Specific Gravity, Urine: 1.012 (ref 1.005–1.030)
pH: 5 (ref 5.0–8.0)

## 2022-10-28 LAB — URINE CULTURE

## 2022-10-28 MED ORDER — DIAZEPAM 5 MG/ML IJ SOLN: 2.5000 mg | Freq: Once | INTRAMUSCULAR | Status: DC

## 2022-10-28 NOTE — ED Triage Notes (Signed)
EMS states pt had an unwitnessed fall.  Pt has large abrasion on left side of forehead.  Pt is at baseline for orientation. Pt has skin tears to left upper forearm & left shoulder that are fresh & bleeding.  Old skin tears on right forearm & bilateral knees are bandaged prior to arrival.

## 2022-10-28 NOTE — ED Provider Notes (Signed)
[place] EMERGENCY DEPARTMENT AT [facility] Provider Note   CSN: [id number] Arrival date & time: 10/28/22  3845     History {Add pertinent medical, surgical, social history, OB history to HPI:1} Chief Complaint  Patient presents with   Marletta Lor    Jordan [name] is a 89 y.o. male.  HPI     This is a 89 year old male who presents following a fall.  He had an unwitnessed fall.  He was found on the floor with a large abrasion to the left forehead and a skin tear to the left arm.  He is only oriented to himself.  He does not provide any history.  Cannot give any collateral information.  He is on hospice care.  Home Medications Prior to Admission medications   Medication Sig Start Date End Date Taking? Authorizing Provider  acetaminophen (TYLENOL) 325 MG tablet Take 650 mg by mouth 3 (three) times daily.    [provider]  bisacodyl (DULCOLAX) 10 MG suppository Place 1 suppository (10 mg total) rectally daily as needed for moderate constipation. 09/07/22   Sreenath, Sudheer B, MD  budesonide (ENTOCORT EC) 3 MG 24 hr capsule TAKE 1 CAPSULE BY MOUTH DAILY. Patient taking differently: Take 3 mg by mouth in the morning. (0800) 06/12/22   Dolores Frame, MD  docusate (COLACE) 50 MG/5ML liquid Take 10 mLs (100 mg total) by mouth daily. 09/07/22   Tresa Moore, MD  feeding supplement (ENSURE ENLIVE / ENSURE PLUS) LIQD Take 237 mLs by mouth 3 (three) times daily after meals. 07/06/22   Kirby Crigler, Mir M, MD  finasteride (PROSCAR) 5 MG tablet TAKE 1 TABLET BY MOUTH DAILY AS DIRECTED. Patient taking differently: Take 5 mg by mouth daily. 08/08/22   [name], Mardene Celeste, MD  melatonin 5 MG TABS Take 5 mg by mouth at bedtime as needed (sleep).    [provider]  memantine (NAMENDA) 10 MG tablet Take 10 mg by mouth 2 (two) times daily. (0800, 2000)    [provider]  Multiple Vitamins-Minerals (PRESERVISION AREDS) TABS Take 1 tablet by  mouth in the morning. (0800)    [provider]  oxybutynin (DITROPAN) 5 MG tablet Take 1 tablet (5 mg total) by mouth 2 (two) times daily. 09/07/22   Tresa Moore, MD  QUEtiapine (SEROQUEL) 25 MG tablet Take 3 tablets (75 mg total) by mouth at bedtime. 09/07/22 10/07/22  Tresa Moore, MD  senna (SENOKOT) 8.6 MG TABS tablet Take 1 tablet (8.6 mg total) by mouth daily as needed for mild constipation. 09/07/22   Tresa Moore, MD      Allergies    Patient has no known allergies.    Review of Systems   Review of Systems  Unable to perform ROS: Other    Physical Exam Updated Vital Signs BP (!) 162/64   Pulse (!) 58   Temp 98 F (36.7 C) (Oral)   Resp 16   Ht 1.727 m (5\' 8" )   Wt 54.4 kg   SpO2 100%   BMI 18.25 kg/m  Physical Exam Vitals and nursing note reviewed.  Constitutional:      Appearance: He is well-developed.     Comments: Elderly, chronically ill-appearing, no acute distress, ABCs intact  HENT:     Head: Normocephalic.     Comments: Hematoma left forehead Eyes:     Extraocular Movements: Extraocular movements intact.     Pupils: Pupils are equal, round, and reactive to light.  Cardiovascular:  Rate and Rhythm: Normal rate and regular rhythm.     Heart sounds: Normal heart sounds.  Pulmonary:     Effort: Pulmonary effort is normal. No respiratory distress.     Breath sounds: Normal breath sounds. No wheezing.  Abdominal:     Palpations: Abdomen is soft.     Tenderness: There is no abdominal tenderness. There is no rebound.  Musculoskeletal:     Cervical back: Neck supple.  Lymphadenopathy:     Cervical: No cervical adenopathy.  Skin:    General: Skin is warm and dry.     Comments: Skin tear noted left mid proximal arm, tenderness to palpation, no obvious deformity, multiple ecchymosis noted at varying stages of healing  Neurological:     Mental Status: He is alert.     Comments: Oriented to self  Psychiatric:        Mood and  Affect: Mood normal.     ED Results / Procedures / Treatments   Labs (all labs ordered are listed, but only abnormal results are displayed) Labs Reviewed  URINALYSIS, W/ REFLEX TO CULTURE (INFECTION SUSPECTED)    EKG None  Radiology No results found.  Procedures Procedures  {Document cardiac monitor, telemetry assessment procedure when appropriate:1}  Medications Ordered in ED Medications - No data to display  ED Course/ Medical Decision Making/ A&P   {   Click here for ABCD2, HEART and other calculatorsREFRESH Note before signing :1}                          Medical Decision Making Amount and/or Complexity of Data Reviewed Radiology: ordered.   ***  {Document critical care time when appropriate:1} {Document review of labs and clinical decision tools ie heart score, Chads2Vasc2 etc:1}  {Document your independent review of radiology images, and any outside records:1} {Document your discussion with family members, caretakers, and with consultants:1} {Document social determinants of health affecting pt's care:1} {Document your decision making why or why not admission, treatments were needed:1} Final Clinical Impression(s) / ED Diagnoses Final diagnoses:  None    Rx / DC Orders ED Discharge Orders     None

## 2022-10-28 NOTE — ED Notes (Signed)
Attempted to ambulate pt, RN & Tech stood patient up at bedside and he could barley stand. Bearing weight on tech more that 75%. He leans back instead of standing straight up. Unable to take any steps at all.

## 2022-10-28 NOTE — ED Notes (Signed)
Aware we need urine sample, patient has male wick on but is asleep. No urine in canister.

## 2022-10-28 NOTE — ED Notes (Signed)
Patient transported to CT 

## 2022-10-28 NOTE — ED Notes (Signed)
Patient transported back from CT to treatment room. Stretcher in lowest position, call bell in reach, pt reoriented to room & how to call for assistance.

## 2022-10-28 NOTE — ED Provider Notes (Addendum)
Pt signed out by Dr. Wilkie Aye pending CT and Xrays.  CT head/c-spine: No acute intracranial abnormality. Small scalp hematoma along the  left frontal scalp. No evidence of underlying calvarial fracture.  2. No acute cervical spine fracture.  3. Findings of DISH and OPLL with moderate spinal canal narrowing at  C5-C6.   Humerus: Essentially single view left humerus which is unremarkable.   UA neg for infection.  Pt not wanting to walk.  I think this is due to his dementia, but as he fell, a CT hip was ordered and this was neg for fx.  CT did show a fecal impaction.  I was able to remove some stool, but there was not a large impaction that I could feel. Stool was soft.  I spoke with her daughter.  She said the facility did not notify her that he fell and had to come to the ED.  Pt is stable for d/c.  Return if worse.    Jacalyn Lefevre, MD 10/28/22 [id number]    Jacalyn Lefevre, MD 10/28/22 1324    Jacalyn Lefevre, MD 10/28/22 [phone]

## 2022-10-30 LAB — URINE CULTURE

## 2022-10-31 ENCOUNTER — Telehealth (HOSPITAL_BASED_OUTPATIENT_CLINIC_OR_DEPARTMENT_OTHER): Payer: Self-pay | Admitting: Emergency Medicine

## 2022-10-31 NOTE — Telephone Encounter (Signed)
Post ED Visit - Positive Culture Follow-up  Culture report reviewed by antimicrobial stewardship pharmacist: [facility] []  Enzo Bi, Pharm.D. []  Celedonio Miyamoto, Pharm.D., BCPS AQ-ID []  Garvin Fila, Pharm.D., BCPS []  Georgina Pillion, Pharm.D., BCPS []  [place], [address].D., BCPS, AAHIVP []  Estella Husk, Pharm.D., BCPS, AAHIVP []  Lysle Pearl, PharmD, BCPS []  Phillips Climes, PharmD, BCPS []  Agapito Games, PharmD, BCPS []  Verlan Friends, PharmD []  Mervyn Gay, PharmD, BCPS []  Vinnie Level, PharmD Eldridge Scot PharmD  [facility] []  Len Childs, PharmD []  Greer Pickerel, PharmD []  Adalberto Cole, PharmD []  Perlie Gold, Rph []  Lonell Face) Jean Rosenthal, PharmD []  Earl Many, PharmD []  Junita Push, PharmD []  Dorna Leitz, PharmD []  Terrilee Files, PharmD []  Lynann Beaver, PharmD []  Keturah Barre, PharmD []  Loralee Pacas, PharmD []  Bernadene Person, PharmD   Positive urine culture Treated with none, asymptomatic bacteriuria, no further patient follow-up is required at this time.  Berle Mull 10/31/2022, 1:16 PM

## 2022-11-01 DIAGNOSIS — R239 Unspecified skin changes: Secondary | ICD-10-CM | POA: Diagnosis not present

## 2022-11-01 DIAGNOSIS — N3946 Mixed incontinence: Secondary | ICD-10-CM | POA: Diagnosis not present

## 2022-11-01 DIAGNOSIS — E1169 Type 2 diabetes mellitus with other specified complication: Secondary | ICD-10-CM | POA: Diagnosis not present

## 2022-11-01 DIAGNOSIS — M6281 Muscle weakness (generalized): Secondary | ICD-10-CM | POA: Diagnosis not present

## 2022-11-08 DIAGNOSIS — M6281 Muscle weakness (generalized): Secondary | ICD-10-CM | POA: Diagnosis not present

## 2022-11-08 DIAGNOSIS — N3946 Mixed incontinence: Secondary | ICD-10-CM | POA: Diagnosis not present

## 2022-11-08 DIAGNOSIS — E1169 Type 2 diabetes mellitus with other specified complication: Secondary | ICD-10-CM | POA: Diagnosis not present

## 2022-11-08 DIAGNOSIS — R239 Unspecified skin changes: Secondary | ICD-10-CM | POA: Diagnosis not present

## 2022-11-15 DIAGNOSIS — N3946 Mixed incontinence: Secondary | ICD-10-CM | POA: Diagnosis not present

## 2022-11-15 DIAGNOSIS — E1169 Type 2 diabetes mellitus with other specified complication: Secondary | ICD-10-CM | POA: Diagnosis not present

## 2022-11-15 DIAGNOSIS — M6281 Muscle weakness (generalized): Secondary | ICD-10-CM | POA: Diagnosis not present

## 2022-11-15 DIAGNOSIS — R239 Unspecified skin changes: Secondary | ICD-10-CM | POA: Diagnosis not present

## 2022-11-17 ENCOUNTER — Ambulatory Visit: Payer: Medicare Other | Admitting: Urology

## 2022-11-17 DIAGNOSIS — N138 Other obstructive and reflux uropathy: Secondary | ICD-10-CM

## 2022-11-20 DIAGNOSIS — M25552 Pain in left hip: Secondary | ICD-10-CM | POA: Diagnosis not present

## 2022-11-20 DIAGNOSIS — M25551 Pain in right hip: Secondary | ICD-10-CM | POA: Diagnosis not present

## 2022-11-20 DIAGNOSIS — R079 Chest pain, unspecified: Secondary | ICD-10-CM | POA: Diagnosis not present

## 2022-11-22 DIAGNOSIS — R239 Unspecified skin changes: Secondary | ICD-10-CM | POA: Diagnosis not present

## 2022-11-22 DIAGNOSIS — E1169 Type 2 diabetes mellitus with other specified complication: Secondary | ICD-10-CM | POA: Diagnosis not present

## 2022-11-22 DIAGNOSIS — M6281 Muscle weakness (generalized): Secondary | ICD-10-CM | POA: Diagnosis not present

## 2022-11-22 DIAGNOSIS — N3946 Mixed incontinence: Secondary | ICD-10-CM | POA: Diagnosis not present

## 2022-12-16 DEATH — deceased

## 2022-12-29 ENCOUNTER — Ambulatory Visit: Payer: Medicare Other | Admitting: Urology
